# Patient Record
Sex: Male | Born: 1940 | Race: White | Hispanic: No | Smoking: Former smoker
Health system: Southern US, Community
[De-identification: ages and names within clinical notes are randomized; demographics above are authoritative.]

## PROBLEM LIST (undated history)

## (undated) DIAGNOSIS — F419 Anxiety disorder, unspecified: Secondary | ICD-10-CM

## (undated) DIAGNOSIS — I219 Acute myocardial infarction, unspecified: Secondary | ICD-10-CM

## (undated) DIAGNOSIS — E1065 Type 1 diabetes mellitus with hyperglycemia: Secondary | ICD-10-CM

## (undated) DIAGNOSIS — E119 Type 2 diabetes mellitus without complications: Secondary | ICD-10-CM

## (undated) DIAGNOSIS — I10 Essential (primary) hypertension: Secondary | ICD-10-CM

## (undated) DIAGNOSIS — F0390 Unspecified dementia without behavioral disturbance: Secondary | ICD-10-CM

## (undated) DIAGNOSIS — M545 Low back pain, unspecified: Secondary | ICD-10-CM

## (undated) DIAGNOSIS — E1049 Type 1 diabetes mellitus with other diabetic neurological complication: Secondary | ICD-10-CM

## (undated) DIAGNOSIS — R569 Unspecified convulsions: Secondary | ICD-10-CM

## (undated) DIAGNOSIS — R5383 Other fatigue: Secondary | ICD-10-CM

## (undated) DIAGNOSIS — J45909 Unspecified asthma, uncomplicated: Secondary | ICD-10-CM

## (undated) DIAGNOSIS — R5381 Other malaise: Secondary | ICD-10-CM

## (undated) DIAGNOSIS — E039 Hypothyroidism, unspecified: Secondary | ICD-10-CM

## (undated) DIAGNOSIS — J189 Pneumonia, unspecified organism: Secondary | ICD-10-CM

## (undated) DIAGNOSIS — I2510 Atherosclerotic heart disease of native coronary artery without angina pectoris: Secondary | ICD-10-CM

## (undated) DIAGNOSIS — G609 Hereditary and idiopathic neuropathy, unspecified: Secondary | ICD-10-CM

## (undated) DIAGNOSIS — F329 Major depressive disorder, single episode, unspecified: Secondary | ICD-10-CM

## (undated) DIAGNOSIS — E1051 Type 1 diabetes mellitus with diabetic peripheral angiopathy without gangrene: Secondary | ICD-10-CM

## (undated) DIAGNOSIS — N400 Enlarged prostate without lower urinary tract symptoms: Secondary | ICD-10-CM

## (undated) DIAGNOSIS — K529 Noninfective gastroenteritis and colitis, unspecified: Secondary | ICD-10-CM

## (undated) DIAGNOSIS — D509 Iron deficiency anemia, unspecified: Secondary | ICD-10-CM

## (undated) DIAGNOSIS — K649 Unspecified hemorrhoids: Secondary | ICD-10-CM

## (undated) DIAGNOSIS — E538 Deficiency of other specified B group vitamins: Secondary | ICD-10-CM

## (undated) DIAGNOSIS — E291 Testicular hypofunction: Secondary | ICD-10-CM

## (undated) DIAGNOSIS — A419 Sepsis, unspecified organism: Secondary | ICD-10-CM

## (undated) DIAGNOSIS — E785 Hyperlipidemia, unspecified: Secondary | ICD-10-CM

## (undated) DIAGNOSIS — E11329 Type 2 diabetes mellitus with mild nonproliferative diabetic retinopathy without macular edema: Secondary | ICD-10-CM

## (undated) DIAGNOSIS — M4802 Spinal stenosis, cervical region: Secondary | ICD-10-CM

## (undated) DIAGNOSIS — IMO0002 Reserved for concepts with insufficient information to code with codable children: Secondary | ICD-10-CM

## (undated) DIAGNOSIS — N189 Chronic kidney disease, unspecified: Secondary | ICD-10-CM

## (undated) DIAGNOSIS — F32A Depression, unspecified: Secondary | ICD-10-CM

## (undated) DIAGNOSIS — D638 Anemia in other chronic diseases classified elsewhere: Secondary | ICD-10-CM

## (undated) DIAGNOSIS — J181 Lobar pneumonia, unspecified organism: Secondary | ICD-10-CM

## (undated) DIAGNOSIS — G8929 Other chronic pain: Secondary | ICD-10-CM

## (undated) DIAGNOSIS — N2889 Other specified disorders of kidney and ureter: Secondary | ICD-10-CM

## (undated) DIAGNOSIS — I951 Orthostatic hypotension: Secondary | ICD-10-CM

## (undated) DIAGNOSIS — N529 Male erectile dysfunction, unspecified: Secondary | ICD-10-CM

## (undated) DIAGNOSIS — K589 Irritable bowel syndrome without diarrhea: Secondary | ICD-10-CM

## (undated) DIAGNOSIS — J449 Chronic obstructive pulmonary disease, unspecified: Secondary | ICD-10-CM

## (undated) DIAGNOSIS — G4710 Hypersomnia, unspecified: Secondary | ICD-10-CM

## (undated) DIAGNOSIS — N179 Acute kidney failure, unspecified: Secondary | ICD-10-CM

## (undated) DIAGNOSIS — G9340 Encephalopathy, unspecified: Secondary | ICD-10-CM

## (undated) DIAGNOSIS — J9601 Acute respiratory failure with hypoxia: Secondary | ICD-10-CM

## (undated) HISTORY — DX: Sepsis, unspecified organism: A41.9

## (undated) HISTORY — DX: Low back pain, unspecified: M54.50

## (undated) HISTORY — DX: Type 2 diabetes mellitus with mild nonproliferative diabetic retinopathy without macular edema: E11.329

## (undated) HISTORY — DX: Lobar pneumonia, unspecified organism: J18.1

## (undated) HISTORY — DX: Essential (primary) hypertension: I10

## (undated) HISTORY — DX: Low back pain: M54.5

## (undated) HISTORY — DX: Chronic obstructive pulmonary disease, unspecified: J44.9

## (undated) HISTORY — DX: Unspecified convulsions: R56.9

## (undated) HISTORY — DX: Chronic kidney disease, unspecified: N17.9

## (undated) HISTORY — DX: Other fatigue: R53.83

## (undated) HISTORY — DX: Type 1 diabetes mellitus with other diabetic neurological complication: E10.49

## (undated) HISTORY — DX: Anxiety disorder, unspecified: F41.9

## (undated) HISTORY — DX: Pneumonia, unspecified organism: J18.9

## (undated) HISTORY — DX: Benign prostatic hyperplasia without lower urinary tract symptoms: N40.0

## (undated) HISTORY — DX: Type 1 diabetes mellitus with diabetic peripheral angiopathy without gangrene: E10.51

## (undated) HISTORY — DX: Spinal stenosis, cervical region: M48.02

## (undated) HISTORY — DX: Noninfective gastroenteritis and colitis, unspecified: K52.9

## (undated) HISTORY — DX: Encephalopathy, unspecified: G93.40

## (undated) HISTORY — DX: Anemia in other chronic diseases classified elsewhere: D63.8

## (undated) HISTORY — DX: Other chronic pain: G89.29

## (undated) HISTORY — DX: Acute myocardial infarction, unspecified: I21.9

## (undated) HISTORY — DX: Other malaise: R53.81

## (undated) HISTORY — DX: Acute kidney failure, unspecified: N18.9

## (undated) HISTORY — DX: Major depressive disorder, single episode, unspecified: F32.9

## (undated) HISTORY — DX: Unspecified dementia, unspecified severity, without behavioral disturbance, psychotic disturbance, mood disturbance, and anxiety: F03.90

## (undated) HISTORY — DX: Hypersomnia, unspecified: G47.10

## (undated) HISTORY — DX: Orthostatic hypotension: I95.1

## (undated) HISTORY — DX: Unspecified hemorrhoids: K64.9

## (undated) HISTORY — DX: Atherosclerotic heart disease of native coronary artery without angina pectoris: I25.10

## (undated) HISTORY — DX: Reserved for concepts with insufficient information to code with codable children: IMO0002

## (undated) HISTORY — DX: Depression, unspecified: F32.A

## (undated) HISTORY — DX: Acute respiratory failure with hypoxia: J96.01

## (undated) HISTORY — DX: Testicular hypofunction: E29.1

## (undated) HISTORY — DX: Irritable bowel syndrome without diarrhea: K58.9

## (undated) HISTORY — DX: Type 1 diabetes mellitus with hyperglycemia: E10.65

## (undated) HISTORY — DX: Hypothyroidism, unspecified: E03.9

## (undated) HISTORY — PX: PENILE PROSTHESIS IMPLANT: SHX240

## (undated) HISTORY — DX: Hereditary and idiopathic neuropathy, unspecified: G60.9

## (undated) HISTORY — DX: Deficiency of other specified B group vitamins: E53.8

## (undated) HISTORY — PX: TONSILLECTOMY: SUR1361

## (undated) HISTORY — DX: Male erectile dysfunction, unspecified: N52.9

## (undated) HISTORY — DX: Iron deficiency anemia, unspecified: D50.9

## (undated) HISTORY — DX: Hyperlipidemia, unspecified: E78.5

## (undated) HISTORY — DX: Type 2 diabetes mellitus without complications: E11.9

## (undated) HISTORY — DX: Chronic kidney disease, unspecified: N18.9

---

## 1984-09-07 DIAGNOSIS — E119 Type 2 diabetes mellitus without complications: Secondary | ICD-10-CM

## 1984-09-07 HISTORY — DX: Type 2 diabetes mellitus without complications: E11.9

## 1999-07-12 ENCOUNTER — Emergency Department (HOSPITAL_COMMUNITY): Admission: EM | Admit: 1999-07-12 | Discharge: 1999-07-12 | Payer: Self-pay | Admitting: Emergency Medicine

## 1999-07-14 ENCOUNTER — Inpatient Hospital Stay (HOSPITAL_COMMUNITY): Admission: EM | Admit: 1999-07-14 | Discharge: 1999-07-17 | Payer: Self-pay | Admitting: Emergency Medicine

## 1999-07-14 ENCOUNTER — Encounter: Payer: Self-pay | Admitting: Internal Medicine

## 2002-01-05 DIAGNOSIS — M4802 Spinal stenosis, cervical region: Secondary | ICD-10-CM

## 2002-01-05 HISTORY — DX: Spinal stenosis, cervical region: M48.02

## 2002-01-18 ENCOUNTER — Ambulatory Visit (HOSPITAL_COMMUNITY): Admission: RE | Admit: 2002-01-18 | Discharge: 2002-01-18 | Payer: Self-pay | Admitting: Sports Medicine

## 2002-01-18 ENCOUNTER — Encounter: Payer: Self-pay | Admitting: Sports Medicine

## 2002-04-19 ENCOUNTER — Inpatient Hospital Stay (HOSPITAL_COMMUNITY): Admission: EM | Admit: 2002-04-19 | Discharge: 2002-04-21 | Payer: Self-pay | Admitting: Emergency Medicine

## 2002-04-19 ENCOUNTER — Encounter: Payer: Self-pay | Admitting: Internal Medicine

## 2002-04-20 ENCOUNTER — Encounter: Payer: Self-pay | Admitting: Internal Medicine

## 2002-08-08 ENCOUNTER — Encounter: Admission: RE | Admit: 2002-08-08 | Discharge: 2002-11-06 | Payer: Self-pay | Admitting: Internal Medicine

## 2004-03-06 ENCOUNTER — Inpatient Hospital Stay (HOSPITAL_COMMUNITY): Admission: EM | Admit: 2004-03-06 | Discharge: 2004-03-08 | Payer: Self-pay | Admitting: Emergency Medicine

## 2004-06-09 ENCOUNTER — Emergency Department (HOSPITAL_COMMUNITY): Admission: EM | Admit: 2004-06-09 | Discharge: 2004-06-09 | Payer: Self-pay | Admitting: Emergency Medicine

## 2004-06-12 ENCOUNTER — Inpatient Hospital Stay (HOSPITAL_COMMUNITY): Admission: EM | Admit: 2004-06-12 | Discharge: 2004-06-16 | Payer: Self-pay | Admitting: Emergency Medicine

## 2004-06-17 ENCOUNTER — Ambulatory Visit: Payer: Self-pay | Admitting: Internal Medicine

## 2004-06-20 ENCOUNTER — Ambulatory Visit: Payer: Self-pay | Admitting: Internal Medicine

## 2004-06-24 ENCOUNTER — Ambulatory Visit: Payer: Self-pay | Admitting: Internal Medicine

## 2004-08-04 ENCOUNTER — Ambulatory Visit: Payer: Self-pay | Admitting: Internal Medicine

## 2004-08-11 ENCOUNTER — Ambulatory Visit: Payer: Self-pay | Admitting: Internal Medicine

## 2004-08-15 ENCOUNTER — Ambulatory Visit: Payer: Self-pay | Admitting: Internal Medicine

## 2004-08-25 ENCOUNTER — Ambulatory Visit: Payer: Self-pay | Admitting: Internal Medicine

## 2004-10-01 ENCOUNTER — Ambulatory Visit: Payer: Self-pay | Admitting: Internal Medicine

## 2004-10-16 ENCOUNTER — Ambulatory Visit: Payer: Self-pay | Admitting: Internal Medicine

## 2004-10-23 ENCOUNTER — Ambulatory Visit: Payer: Self-pay | Admitting: Internal Medicine

## 2004-10-29 ENCOUNTER — Ambulatory Visit: Payer: Self-pay | Admitting: Internal Medicine

## 2004-10-30 ENCOUNTER — Ambulatory Visit: Payer: Self-pay | Admitting: Internal Medicine

## 2004-11-27 ENCOUNTER — Ambulatory Visit: Payer: Self-pay | Admitting: Internal Medicine

## 2004-11-28 ENCOUNTER — Ambulatory Visit: Payer: Self-pay | Admitting: Internal Medicine

## 2004-12-31 ENCOUNTER — Ambulatory Visit: Payer: Self-pay | Admitting: Internal Medicine

## 2005-01-01 LAB — HM COLONOSCOPY

## 2005-01-27 ENCOUNTER — Ambulatory Visit: Payer: Self-pay | Admitting: Internal Medicine

## 2005-02-04 ENCOUNTER — Ambulatory Visit: Payer: Self-pay | Admitting: Internal Medicine

## 2005-02-18 ENCOUNTER — Ambulatory Visit: Payer: Self-pay | Admitting: Internal Medicine

## 2005-05-22 ENCOUNTER — Ambulatory Visit: Payer: Self-pay | Admitting: Internal Medicine

## 2005-05-22 ENCOUNTER — Inpatient Hospital Stay (HOSPITAL_COMMUNITY): Admission: AD | Admit: 2005-05-22 | Discharge: 2005-05-28 | Payer: Self-pay | Admitting: Internal Medicine

## 2005-05-23 ENCOUNTER — Ambulatory Visit: Payer: Self-pay | Admitting: Psychiatry

## 2005-05-25 ENCOUNTER — Encounter (INDEPENDENT_AMBULATORY_CARE_PROVIDER_SITE_OTHER): Payer: Self-pay | Admitting: Interventional Cardiology

## 2005-05-28 ENCOUNTER — Encounter: Payer: Self-pay | Admitting: Internal Medicine

## 2005-06-03 ENCOUNTER — Emergency Department (HOSPITAL_COMMUNITY): Admission: EM | Admit: 2005-06-03 | Discharge: 2005-06-03 | Payer: Self-pay | Admitting: Emergency Medicine

## 2005-06-10 ENCOUNTER — Ambulatory Visit: Payer: Self-pay | Admitting: Internal Medicine

## 2005-06-25 ENCOUNTER — Ambulatory Visit (HOSPITAL_COMMUNITY): Admission: RE | Admit: 2005-06-25 | Discharge: 2005-06-25 | Payer: Self-pay | Admitting: Gastroenterology

## 2005-06-25 ENCOUNTER — Encounter (INDEPENDENT_AMBULATORY_CARE_PROVIDER_SITE_OTHER): Payer: Self-pay | Admitting: Specialist

## 2005-06-25 HISTORY — PX: COLONOSCOPY: SHX174

## 2005-06-26 ENCOUNTER — Ambulatory Visit: Payer: Self-pay | Admitting: Internal Medicine

## 2005-07-27 ENCOUNTER — Ambulatory Visit (HOSPITAL_COMMUNITY): Payer: Self-pay | Admitting: Psychiatry

## 2005-11-04 ENCOUNTER — Ambulatory Visit: Payer: Self-pay | Admitting: Internal Medicine

## 2005-11-12 ENCOUNTER — Ambulatory Visit: Payer: Self-pay | Admitting: Internal Medicine

## 2005-12-03 ENCOUNTER — Ambulatory Visit: Payer: Self-pay | Admitting: Internal Medicine

## 2005-12-16 ENCOUNTER — Ambulatory Visit: Payer: Self-pay | Admitting: Internal Medicine

## 2006-01-04 ENCOUNTER — Ambulatory Visit: Payer: Self-pay | Admitting: Hospitalist

## 2006-01-14 ENCOUNTER — Ambulatory Visit: Payer: Self-pay | Admitting: Internal Medicine

## 2006-01-21 ENCOUNTER — Ambulatory Visit: Payer: Self-pay | Admitting: Internal Medicine

## 2006-02-04 ENCOUNTER — Ambulatory Visit: Payer: Self-pay | Admitting: Internal Medicine

## 2006-02-11 ENCOUNTER — Ambulatory Visit (HOSPITAL_COMMUNITY): Admission: RE | Admit: 2006-02-11 | Discharge: 2006-02-11 | Payer: Self-pay | Admitting: Internal Medicine

## 2006-02-18 ENCOUNTER — Encounter (HOSPITAL_COMMUNITY): Admission: RE | Admit: 2006-02-18 | Discharge: 2006-05-19 | Payer: Self-pay | Admitting: Internal Medicine

## 2006-03-17 ENCOUNTER — Ambulatory Visit: Payer: Self-pay | Admitting: Internal Medicine

## 2006-05-06 ENCOUNTER — Encounter: Admission: RE | Admit: 2006-05-06 | Discharge: 2006-05-06 | Payer: Self-pay | Admitting: Internal Medicine

## 2006-05-06 ENCOUNTER — Ambulatory Visit: Payer: Self-pay | Admitting: Internal Medicine

## 2006-05-06 ENCOUNTER — Ambulatory Visit (HOSPITAL_COMMUNITY): Admission: RE | Admit: 2006-05-06 | Discharge: 2006-05-06 | Payer: Self-pay | Admitting: Internal Medicine

## 2006-07-01 ENCOUNTER — Ambulatory Visit: Payer: Self-pay | Admitting: Internal Medicine

## 2006-07-20 ENCOUNTER — Ambulatory Visit: Payer: Self-pay | Admitting: Hospitalist

## 2006-09-24 ENCOUNTER — Telehealth (INDEPENDENT_AMBULATORY_CARE_PROVIDER_SITE_OTHER): Payer: Self-pay | Admitting: *Deleted

## 2006-09-27 DIAGNOSIS — D509 Iron deficiency anemia, unspecified: Secondary | ICD-10-CM

## 2006-09-27 DIAGNOSIS — E1049 Type 1 diabetes mellitus with other diabetic neurological complication: Secondary | ICD-10-CM

## 2006-09-27 DIAGNOSIS — E039 Hypothyroidism, unspecified: Secondary | ICD-10-CM

## 2006-09-27 DIAGNOSIS — G4710 Hypersomnia, unspecified: Secondary | ICD-10-CM

## 2006-09-27 DIAGNOSIS — I10 Essential (primary) hypertension: Secondary | ICD-10-CM

## 2006-09-27 DIAGNOSIS — F528 Other sexual dysfunction not due to a substance or known physiological condition: Secondary | ICD-10-CM

## 2006-09-27 DIAGNOSIS — N529 Male erectile dysfunction, unspecified: Secondary | ICD-10-CM

## 2006-09-27 DIAGNOSIS — M4802 Spinal stenosis, cervical region: Secondary | ICD-10-CM | POA: Insufficient documentation

## 2006-09-27 DIAGNOSIS — E1065 Type 1 diabetes mellitus with hyperglycemia: Secondary | ICD-10-CM

## 2006-09-27 HISTORY — DX: Hypothyroidism, unspecified: E03.9

## 2006-09-27 HISTORY — DX: Hypersomnia, unspecified: G47.10

## 2006-09-27 HISTORY — DX: Male erectile dysfunction, unspecified: N52.9

## 2006-09-27 HISTORY — DX: Iron deficiency anemia, unspecified: D50.9

## 2006-10-20 ENCOUNTER — Ambulatory Visit: Payer: Self-pay | Admitting: Hospitalist

## 2006-10-20 LAB — CONVERTED CEMR LAB: Blood Glucose, Fingerstick: 144

## 2006-10-27 ENCOUNTER — Ambulatory Visit: Payer: Self-pay | Admitting: Internal Medicine

## 2006-11-08 ENCOUNTER — Ambulatory Visit: Payer: Self-pay | Admitting: *Deleted

## 2006-11-08 ENCOUNTER — Ambulatory Visit: Payer: Self-pay | Admitting: Internal Medicine

## 2006-11-08 ENCOUNTER — Encounter (INDEPENDENT_AMBULATORY_CARE_PROVIDER_SITE_OTHER): Payer: Self-pay | Admitting: *Deleted

## 2006-11-09 ENCOUNTER — Telehealth (INDEPENDENT_AMBULATORY_CARE_PROVIDER_SITE_OTHER): Payer: Self-pay | Admitting: *Deleted

## 2006-11-17 ENCOUNTER — Encounter (INDEPENDENT_AMBULATORY_CARE_PROVIDER_SITE_OTHER): Payer: Self-pay | Admitting: *Deleted

## 2006-11-17 ENCOUNTER — Ambulatory Visit: Payer: Self-pay | Admitting: *Deleted

## 2006-11-22 ENCOUNTER — Telehealth (INDEPENDENT_AMBULATORY_CARE_PROVIDER_SITE_OTHER): Payer: Self-pay | Admitting: Pharmacy Technician

## 2006-11-30 ENCOUNTER — Ambulatory Visit: Payer: Self-pay | Admitting: Internal Medicine

## 2006-11-30 LAB — CONVERTED CEMR LAB: Blood Glucose, Fingerstick: 90

## 2006-12-29 ENCOUNTER — Telehealth: Payer: Self-pay | Admitting: *Deleted

## 2006-12-31 ENCOUNTER — Encounter (INDEPENDENT_AMBULATORY_CARE_PROVIDER_SITE_OTHER): Payer: Self-pay | Admitting: Internal Medicine

## 2006-12-31 ENCOUNTER — Ambulatory Visit: Payer: Self-pay | Admitting: Hospitalist

## 2006-12-31 ENCOUNTER — Telehealth: Payer: Self-pay | Admitting: *Deleted

## 2007-01-03 LAB — CONVERTED CEMR LAB
BUN: 31 mg/dL — ABNORMAL HIGH (ref 6–23)
CO2: 24 meq/L (ref 19–32)
Calcium: 9.4 mg/dL (ref 8.4–10.5)
Creatinine, Ser: 1.49 mg/dL (ref 0.40–1.50)
Glucose, Bld: 183 mg/dL — ABNORMAL HIGH (ref 70–99)
TSH: 5.423 microintl units/mL (ref 0.350–5.50)

## 2007-01-06 ENCOUNTER — Ambulatory Visit: Payer: Self-pay | Admitting: Internal Medicine

## 2007-01-07 ENCOUNTER — Encounter (INDEPENDENT_AMBULATORY_CARE_PROVIDER_SITE_OTHER): Payer: Self-pay | Admitting: Internal Medicine

## 2007-01-07 ENCOUNTER — Ambulatory Visit: Payer: Self-pay | Admitting: Internal Medicine

## 2007-01-10 LAB — CONVERTED CEMR LAB
BUN: 30 mg/dL — ABNORMAL HIGH (ref 6–23)
MCHC: 32.9 g/dL (ref 30.0–36.0)
Platelets: 257 10*3/uL (ref 150–400)
Potassium: 5.2 meq/L (ref 3.5–5.3)
RDW: 13.3 % (ref 11.5–14.0)
Sodium: 140 meq/L (ref 135–145)

## 2007-01-12 ENCOUNTER — Telehealth (INDEPENDENT_AMBULATORY_CARE_PROVIDER_SITE_OTHER): Payer: Self-pay | Admitting: Internal Medicine

## 2007-01-19 ENCOUNTER — Encounter: Payer: Self-pay | Admitting: *Deleted

## 2007-01-20 ENCOUNTER — Ambulatory Visit (HOSPITAL_COMMUNITY): Admission: RE | Admit: 2007-01-20 | Discharge: 2007-01-20 | Payer: Self-pay | Admitting: Internal Medicine

## 2007-01-20 ENCOUNTER — Ambulatory Visit: Payer: Self-pay | Admitting: Internal Medicine

## 2007-01-20 ENCOUNTER — Encounter: Payer: Self-pay | Admitting: Internal Medicine

## 2007-01-20 DIAGNOSIS — I959 Hypotension, unspecified: Secondary | ICD-10-CM

## 2007-01-20 DIAGNOSIS — E875 Hyperkalemia: Secondary | ICD-10-CM | POA: Insufficient documentation

## 2007-01-20 LAB — CONVERTED CEMR LAB
BUN: 30 mg/dL — ABNORMAL HIGH (ref 6–23)
Chloride: 109 meq/L (ref 96–112)
Creatinine, Ser: 1.24 mg/dL (ref 0.40–1.50)
Eosinophils Absolute: 0.2 10*3/uL (ref 0.0–0.7)
Glucose, Bld: 57 mg/dL — ABNORMAL LOW (ref 70–99)
HCT: 33 % — ABNORMAL LOW (ref 39.0–52.0)
Hemoglobin: 11.2 g/dL — ABNORMAL LOW (ref 13.0–17.0)
Lymphs Abs: 1.4 10*3/uL (ref 0.7–3.3)
MCV: 92.9 fL (ref 78.0–100.0)
Monocytes Absolute: 0.6 10*3/uL (ref 0.2–0.7)
Monocytes Relative: 11 % (ref 3–11)
Neutrophils Relative %: 62 % (ref 43–77)
Potassium: 5.9 meq/L — ABNORMAL HIGH (ref 3.5–5.3)
RBC: 3.55 M/uL — ABNORMAL LOW (ref 4.22–5.81)
WBC: 5.8 10*3/uL (ref 4.0–10.5)

## 2007-01-26 ENCOUNTER — Ambulatory Visit (HOSPITAL_COMMUNITY): Admission: RE | Admit: 2007-01-26 | Discharge: 2007-01-26 | Payer: Self-pay | Admitting: Internal Medicine

## 2007-02-03 ENCOUNTER — Encounter (INDEPENDENT_AMBULATORY_CARE_PROVIDER_SITE_OTHER): Payer: Self-pay | Admitting: *Deleted

## 2007-02-04 ENCOUNTER — Telehealth: Payer: Self-pay | Admitting: *Deleted

## 2007-02-09 ENCOUNTER — Ambulatory Visit: Payer: Self-pay | Admitting: Internal Medicine

## 2007-02-09 ENCOUNTER — Encounter (INDEPENDENT_AMBULATORY_CARE_PROVIDER_SITE_OTHER): Payer: Self-pay | Admitting: *Deleted

## 2007-02-09 DIAGNOSIS — E1142 Type 2 diabetes mellitus with diabetic polyneuropathy: Secondary | ICD-10-CM | POA: Insufficient documentation

## 2007-02-09 DIAGNOSIS — I10 Essential (primary) hypertension: Secondary | ICD-10-CM

## 2007-02-09 DIAGNOSIS — I951 Orthostatic hypotension: Secondary | ICD-10-CM | POA: Insufficient documentation

## 2007-02-09 HISTORY — DX: Essential (primary) hypertension: I10

## 2007-02-09 HISTORY — DX: Orthostatic hypotension: I95.1

## 2007-02-09 LAB — CONVERTED CEMR LAB
BUN: 29 mg/dL — ABNORMAL HIGH (ref 6–23)
Calcium: 9.6 mg/dL (ref 8.4–10.5)
Glucose, Bld: 104 mg/dL — ABNORMAL HIGH (ref 70–99)
Sodium: 140 meq/L (ref 135–145)

## 2007-02-12 ENCOUNTER — Encounter (INDEPENDENT_AMBULATORY_CARE_PROVIDER_SITE_OTHER): Payer: Self-pay | Admitting: Internal Medicine

## 2007-02-14 ENCOUNTER — Telehealth (INDEPENDENT_AMBULATORY_CARE_PROVIDER_SITE_OTHER): Payer: Self-pay | Admitting: *Deleted

## 2007-02-28 ENCOUNTER — Encounter (INDEPENDENT_AMBULATORY_CARE_PROVIDER_SITE_OTHER): Payer: Self-pay | Admitting: *Deleted

## 2007-02-28 ENCOUNTER — Ambulatory Visit: Payer: Self-pay | Admitting: Internal Medicine

## 2007-02-28 DIAGNOSIS — R5383 Other fatigue: Secondary | ICD-10-CM

## 2007-02-28 DIAGNOSIS — R5381 Other malaise: Secondary | ICD-10-CM

## 2007-02-28 LAB — CONVERTED CEMR LAB
Basophils Absolute: 0 10*3/uL (ref 0.0–0.1)
Blood Glucose, Fingerstick: 94
Free T4: 1.09 ng/dL (ref 0.89–1.80)
Lymphocytes Relative: 18 % (ref 12–46)
Neutro Abs: 5.5 10*3/uL (ref 1.7–7.7)
Platelets: 264 10*3/uL (ref 150–400)
RDW: 13.6 % (ref 11.5–14.0)
TSH: 4.143 microintl units/mL (ref 0.350–5.50)

## 2007-03-10 ENCOUNTER — Telehealth (INDEPENDENT_AMBULATORY_CARE_PROVIDER_SITE_OTHER): Payer: Self-pay | Admitting: *Deleted

## 2007-03-15 ENCOUNTER — Ambulatory Visit: Payer: Self-pay | Admitting: Internal Medicine

## 2007-03-15 ENCOUNTER — Encounter (INDEPENDENT_AMBULATORY_CARE_PROVIDER_SITE_OTHER): Payer: Self-pay | Admitting: *Deleted

## 2007-04-11 ENCOUNTER — Telehealth (INDEPENDENT_AMBULATORY_CARE_PROVIDER_SITE_OTHER): Payer: Self-pay | Admitting: *Deleted

## 2007-05-09 ENCOUNTER — Encounter (INDEPENDENT_AMBULATORY_CARE_PROVIDER_SITE_OTHER): Payer: Self-pay | Admitting: *Deleted

## 2007-05-11 ENCOUNTER — Telehealth (INDEPENDENT_AMBULATORY_CARE_PROVIDER_SITE_OTHER): Payer: Self-pay | Admitting: *Deleted

## 2007-05-12 ENCOUNTER — Telehealth (INDEPENDENT_AMBULATORY_CARE_PROVIDER_SITE_OTHER): Payer: Self-pay | Admitting: *Deleted

## 2007-06-09 ENCOUNTER — Telehealth (INDEPENDENT_AMBULATORY_CARE_PROVIDER_SITE_OTHER): Payer: Self-pay | Admitting: *Deleted

## 2007-06-09 ENCOUNTER — Ambulatory Visit: Payer: Self-pay | Admitting: Hospitalist

## 2007-06-10 ENCOUNTER — Telehealth: Payer: Self-pay | Admitting: *Deleted

## 2007-06-10 LAB — CONVERTED CEMR LAB
Potassium: 5.5 meq/L — ABNORMAL HIGH (ref 3.5–5.3)
Sodium: 141 meq/L (ref 135–145)

## 2007-06-14 ENCOUNTER — Ambulatory Visit: Payer: Self-pay | Admitting: *Deleted

## 2007-06-14 LAB — CONVERTED CEMR LAB
CO2: 28 meq/L (ref 19–32)
Glucose, Bld: 136 mg/dL — ABNORMAL HIGH (ref 70–99)
Potassium: 5.4 meq/L — ABNORMAL HIGH (ref 3.5–5.3)
Sodium: 142 meq/L (ref 135–145)

## 2007-06-24 ENCOUNTER — Telehealth: Payer: Self-pay | Admitting: *Deleted

## 2007-06-28 ENCOUNTER — Telehealth (INDEPENDENT_AMBULATORY_CARE_PROVIDER_SITE_OTHER): Payer: Self-pay | Admitting: *Deleted

## 2007-07-12 ENCOUNTER — Telehealth (INDEPENDENT_AMBULATORY_CARE_PROVIDER_SITE_OTHER): Payer: Self-pay | Admitting: *Deleted

## 2007-07-27 ENCOUNTER — Ambulatory Visit: Payer: Self-pay | Admitting: Hospitalist

## 2007-07-27 ENCOUNTER — Encounter (INDEPENDENT_AMBULATORY_CARE_PROVIDER_SITE_OTHER): Payer: Self-pay | Admitting: *Deleted

## 2007-07-27 LAB — CONVERTED CEMR LAB
BUN: 25 mg/dL — ABNORMAL HIGH (ref 6–23)
CO2: 25 meq/L (ref 19–32)
Calcium: 9.2 mg/dL (ref 8.4–10.5)
Creatinine, Ser: 1.1 mg/dL (ref 0.40–1.50)
Glucose, Bld: 137 mg/dL — ABNORMAL HIGH (ref 70–99)
Hgb A1c MFr Bld: 7.5 %

## 2007-07-29 ENCOUNTER — Telehealth (INDEPENDENT_AMBULATORY_CARE_PROVIDER_SITE_OTHER): Payer: Self-pay | Admitting: *Deleted

## 2007-08-08 ENCOUNTER — Ambulatory Visit: Payer: Self-pay | Admitting: Hospitalist

## 2007-08-10 ENCOUNTER — Telehealth (INDEPENDENT_AMBULATORY_CARE_PROVIDER_SITE_OTHER): Payer: Self-pay | Admitting: *Deleted

## 2007-09-06 ENCOUNTER — Telehealth: Payer: Self-pay | Admitting: *Deleted

## 2007-09-20 ENCOUNTER — Telehealth (INDEPENDENT_AMBULATORY_CARE_PROVIDER_SITE_OTHER): Payer: Self-pay | Admitting: *Deleted

## 2007-09-22 ENCOUNTER — Telehealth: Payer: Self-pay | Admitting: Infectious Disease

## 2007-09-29 ENCOUNTER — Encounter (INDEPENDENT_AMBULATORY_CARE_PROVIDER_SITE_OTHER): Payer: Self-pay | Admitting: Internal Medicine

## 2007-09-29 ENCOUNTER — Ambulatory Visit: Payer: Self-pay | Admitting: Internal Medicine

## 2007-09-29 DIAGNOSIS — M79609 Pain in unspecified limb: Secondary | ICD-10-CM

## 2007-09-30 LAB — CONVERTED CEMR LAB
LDL Cholesterol: 101 mg/dL — ABNORMAL HIGH (ref 0–99)
TSH: 2.669 microintl units/mL (ref 0.350–5.50)
Total CHOL/HDL Ratio: 2.9
VLDL: 20 mg/dL (ref 0–40)

## 2007-10-12 ENCOUNTER — Encounter: Admission: RE | Admit: 2007-10-12 | Discharge: 2007-12-07 | Payer: Self-pay | Admitting: Internal Medicine

## 2007-10-12 ENCOUNTER — Encounter (INDEPENDENT_AMBULATORY_CARE_PROVIDER_SITE_OTHER): Payer: Self-pay | Admitting: *Deleted

## 2007-10-19 ENCOUNTER — Encounter (INDEPENDENT_AMBULATORY_CARE_PROVIDER_SITE_OTHER): Payer: Self-pay | Admitting: *Deleted

## 2007-10-27 ENCOUNTER — Encounter (INDEPENDENT_AMBULATORY_CARE_PROVIDER_SITE_OTHER): Payer: Self-pay | Admitting: *Deleted

## 2007-11-01 ENCOUNTER — Telehealth (INDEPENDENT_AMBULATORY_CARE_PROVIDER_SITE_OTHER): Payer: Self-pay | Admitting: *Deleted

## 2007-11-01 ENCOUNTER — Ambulatory Visit: Payer: Self-pay | Admitting: Internal Medicine

## 2007-11-01 LAB — CONVERTED CEMR LAB
Blood Glucose, Fingerstick: 461
Hgb A1c MFr Bld: 8.6 %

## 2007-11-09 ENCOUNTER — Telehealth (INDEPENDENT_AMBULATORY_CARE_PROVIDER_SITE_OTHER): Payer: Self-pay | Admitting: *Deleted

## 2007-11-17 ENCOUNTER — Telehealth: Payer: Self-pay | Admitting: Licensed Clinical Social Worker

## 2007-11-18 DIAGNOSIS — E11329 Type 2 diabetes mellitus with mild nonproliferative diabetic retinopathy without macular edema: Secondary | ICD-10-CM

## 2007-11-18 DIAGNOSIS — E113299 Type 2 diabetes mellitus with mild nonproliferative diabetic retinopathy without macular edema, unspecified eye: Secondary | ICD-10-CM

## 2007-11-18 HISTORY — DX: Type 2 diabetes mellitus with mild nonproliferative diabetic retinopathy without macular edema: E11.329

## 2007-11-28 ENCOUNTER — Telehealth (INDEPENDENT_AMBULATORY_CARE_PROVIDER_SITE_OTHER): Payer: Self-pay | Admitting: *Deleted

## 2007-12-01 ENCOUNTER — Telehealth: Payer: Self-pay | Admitting: Licensed Clinical Social Worker

## 2007-12-07 ENCOUNTER — Encounter (INDEPENDENT_AMBULATORY_CARE_PROVIDER_SITE_OTHER): Payer: Self-pay | Admitting: *Deleted

## 2007-12-12 ENCOUNTER — Ambulatory Visit: Payer: Self-pay | Admitting: Internal Medicine

## 2007-12-12 ENCOUNTER — Encounter (INDEPENDENT_AMBULATORY_CARE_PROVIDER_SITE_OTHER): Payer: Self-pay | Admitting: *Deleted

## 2007-12-13 LAB — CONVERTED CEMR LAB
Calcium: 9.4 mg/dL (ref 8.4–10.5)
MCHC: 32.9 g/dL (ref 30.0–36.0)
MCV: 95.9 fL (ref 78.0–100.0)
Potassium: 5.2 meq/L (ref 3.5–5.3)
RDW: 13.9 % (ref 11.5–15.5)
Sodium: 143 meq/L (ref 135–145)

## 2007-12-14 ENCOUNTER — Telehealth (INDEPENDENT_AMBULATORY_CARE_PROVIDER_SITE_OTHER): Payer: Self-pay | Admitting: *Deleted

## 2007-12-28 ENCOUNTER — Telehealth (INDEPENDENT_AMBULATORY_CARE_PROVIDER_SITE_OTHER): Payer: Self-pay | Admitting: *Deleted

## 2008-01-01 DIAGNOSIS — K529 Noninfective gastroenteritis and colitis, unspecified: Secondary | ICD-10-CM | POA: Insufficient documentation

## 2008-01-01 DIAGNOSIS — R197 Diarrhea, unspecified: Secondary | ICD-10-CM

## 2008-01-01 HISTORY — DX: Noninfective gastroenteritis and colitis, unspecified: K52.9

## 2008-01-05 ENCOUNTER — Ambulatory Visit: Payer: Self-pay | Admitting: Infectious Disease

## 2008-01-05 ENCOUNTER — Encounter (INDEPENDENT_AMBULATORY_CARE_PROVIDER_SITE_OTHER): Payer: Self-pay | Admitting: *Deleted

## 2008-01-05 ENCOUNTER — Ambulatory Visit: Payer: Self-pay | Admitting: Internal Medicine

## 2008-01-05 LAB — CONVERTED CEMR LAB
Blood Glucose, Fingerstick: 195
C-Peptide: <0.05 ng/mL — ABNORMAL LOW (ref 0.80–3.90)

## 2008-01-12 ENCOUNTER — Telehealth (INDEPENDENT_AMBULATORY_CARE_PROVIDER_SITE_OTHER): Payer: Self-pay | Admitting: *Deleted

## 2008-01-16 ENCOUNTER — Encounter (INDEPENDENT_AMBULATORY_CARE_PROVIDER_SITE_OTHER): Payer: Self-pay | Admitting: *Deleted

## 2008-01-23 ENCOUNTER — Encounter (INDEPENDENT_AMBULATORY_CARE_PROVIDER_SITE_OTHER): Payer: Self-pay | Admitting: *Deleted

## 2008-01-25 ENCOUNTER — Telehealth: Payer: Self-pay | Admitting: *Deleted

## 2008-01-25 ENCOUNTER — Telehealth (INDEPENDENT_AMBULATORY_CARE_PROVIDER_SITE_OTHER): Payer: Self-pay | Admitting: *Deleted

## 2008-01-31 ENCOUNTER — Telehealth (INDEPENDENT_AMBULATORY_CARE_PROVIDER_SITE_OTHER): Payer: Self-pay | Admitting: *Deleted

## 2008-02-02 ENCOUNTER — Encounter (INDEPENDENT_AMBULATORY_CARE_PROVIDER_SITE_OTHER): Payer: Self-pay | Admitting: *Deleted

## 2008-02-02 ENCOUNTER — Ambulatory Visit: Payer: Self-pay | Admitting: Internal Medicine

## 2008-02-02 ENCOUNTER — Ambulatory Visit: Payer: Self-pay | Admitting: *Deleted

## 2008-02-02 LAB — CONVERTED CEMR LAB
Basophils Relative: 0 % (ref 0–1)
Blood Glucose, Fingerstick: 260
Eosinophils Absolute: 0.1 10*3/uL (ref 0.0–0.7)
Insulin/Carbohydrate Ratio: 1
MCHC: 34.2 g/dL (ref 30.0–36.0)
MCV: 93.7 fL (ref 78.0–100.0)
Monocytes Absolute: 0.5 10*3/uL (ref 0.1–1.0)
Monocytes Relative: 8 % (ref 3–12)
Neutrophils Relative %: 75 % (ref 43–77)
RBC: 3.72 M/uL — ABNORMAL LOW (ref 4.22–5.81)

## 2008-02-05 LAB — CONVERTED CEMR LAB
ALT: 18 units/L (ref 0–53)
AST: 19 units/L (ref 0–37)
Calcium: 9.6 mg/dL (ref 8.4–10.5)
Chloride: 106 meq/L (ref 96–112)
Creatinine, Ser: 1.06 mg/dL (ref 0.40–1.50)
Potassium: 4.4 meq/L (ref 3.5–5.3)
Sodium: 143 meq/L (ref 135–145)

## 2008-02-06 ENCOUNTER — Encounter (INDEPENDENT_AMBULATORY_CARE_PROVIDER_SITE_OTHER): Payer: Self-pay | Admitting: *Deleted

## 2008-02-06 ENCOUNTER — Telehealth (INDEPENDENT_AMBULATORY_CARE_PROVIDER_SITE_OTHER): Payer: Self-pay | Admitting: *Deleted

## 2008-02-06 ENCOUNTER — Ambulatory Visit: Payer: Self-pay | Admitting: *Deleted

## 2008-02-15 ENCOUNTER — Telehealth (INDEPENDENT_AMBULATORY_CARE_PROVIDER_SITE_OTHER): Payer: Self-pay | Admitting: *Deleted

## 2008-02-16 DIAGNOSIS — E538 Deficiency of other specified B group vitamins: Secondary | ICD-10-CM

## 2008-02-16 HISTORY — DX: Deficiency of other specified B group vitamins: E53.8

## 2008-02-21 ENCOUNTER — Encounter (INDEPENDENT_AMBULATORY_CARE_PROVIDER_SITE_OTHER): Payer: Self-pay | Admitting: *Deleted

## 2008-02-23 ENCOUNTER — Telehealth (INDEPENDENT_AMBULATORY_CARE_PROVIDER_SITE_OTHER): Payer: Self-pay | Admitting: *Deleted

## 2008-02-24 ENCOUNTER — Ambulatory Visit: Payer: Self-pay | Admitting: Internal Medicine

## 2008-02-27 DIAGNOSIS — J449 Chronic obstructive pulmonary disease, unspecified: Secondary | ICD-10-CM | POA: Insufficient documentation

## 2008-02-27 HISTORY — DX: Chronic obstructive pulmonary disease, unspecified: J44.9

## 2008-03-03 ENCOUNTER — Telehealth (INDEPENDENT_AMBULATORY_CARE_PROVIDER_SITE_OTHER): Payer: Self-pay | Admitting: Internal Medicine

## 2008-03-12 ENCOUNTER — Telehealth (INDEPENDENT_AMBULATORY_CARE_PROVIDER_SITE_OTHER): Payer: Self-pay | Admitting: *Deleted

## 2008-03-12 ENCOUNTER — Encounter: Payer: Self-pay | Admitting: *Deleted

## 2008-03-13 ENCOUNTER — Encounter (INDEPENDENT_AMBULATORY_CARE_PROVIDER_SITE_OTHER): Payer: Self-pay | Admitting: *Deleted

## 2008-03-13 ENCOUNTER — Ambulatory Visit: Payer: Self-pay | Admitting: Internal Medicine

## 2008-03-13 DIAGNOSIS — R351 Nocturia: Secondary | ICD-10-CM | POA: Insufficient documentation

## 2008-03-13 LAB — CONVERTED CEMR LAB
BUN: 21 mg/dL (ref 6–23)
Calcium: 9 mg/dL (ref 8.4–10.5)
Creatinine, Ser: 1.08 mg/dL (ref 0.40–1.50)
Glucose, Bld: 207 mg/dL — ABNORMAL HIGH (ref 70–99)
PSA: 0.37 ng/mL (ref 0.10–4.00)
Sodium: 141 meq/L (ref 135–145)

## 2008-03-14 ENCOUNTER — Encounter (INDEPENDENT_AMBULATORY_CARE_PROVIDER_SITE_OTHER): Payer: Self-pay | Admitting: *Deleted

## 2008-03-27 ENCOUNTER — Telehealth (INDEPENDENT_AMBULATORY_CARE_PROVIDER_SITE_OTHER): Payer: Self-pay | Admitting: *Deleted

## 2008-03-28 ENCOUNTER — Ambulatory Visit: Payer: Self-pay | Admitting: Internal Medicine

## 2008-03-30 ENCOUNTER — Ambulatory Visit: Payer: Self-pay | Admitting: Internal Medicine

## 2008-04-03 ENCOUNTER — Ambulatory Visit: Payer: Self-pay | Admitting: Internal Medicine

## 2008-04-03 LAB — CONVERTED CEMR LAB
ALT: 18 units/L (ref 0–53)
Alkaline Phosphatase: 71 units/L (ref 39–117)
Bilirubin, Direct: 0.1 mg/dL (ref 0.0–0.3)
CO2: 26 meq/L (ref 19–32)
Glucose, Bld: 217 mg/dL — ABNORMAL HIGH (ref 70–99)
Lymphocytes Relative: 15.4 % (ref 12.0–46.0)
Monocytes Relative: 8.1 % (ref 3.0–12.0)
Neutrophils Relative %: 73.7 % (ref 43.0–77.0)
Platelets: 249 10*3/uL (ref 150–400)
Potassium: 5.6 meq/L — ABNORMAL HIGH (ref 3.5–5.1)
RDW: 12.8 % (ref 11.5–14.6)
Sodium: 139 meq/L (ref 135–145)
Tissue Transglutaminase Ab, IgA: 0.4 units (ref <7)
Total Bilirubin: 0.7 mg/dL (ref 0.3–1.2)
Total Protein: 6.7 g/dL (ref 6.0–8.3)

## 2008-04-04 ENCOUNTER — Encounter: Payer: Self-pay | Admitting: Internal Medicine

## 2008-04-07 ENCOUNTER — Encounter: Payer: Self-pay | Admitting: Internal Medicine

## 2008-04-11 ENCOUNTER — Telehealth (INDEPENDENT_AMBULATORY_CARE_PROVIDER_SITE_OTHER): Payer: Self-pay | Admitting: *Deleted

## 2008-04-16 ENCOUNTER — Telehealth: Payer: Self-pay | Admitting: Internal Medicine

## 2008-04-23 ENCOUNTER — Encounter (INDEPENDENT_AMBULATORY_CARE_PROVIDER_SITE_OTHER): Payer: Self-pay | Admitting: *Deleted

## 2008-04-27 ENCOUNTER — Ambulatory Visit: Payer: Self-pay | Admitting: Internal Medicine

## 2008-04-27 DIAGNOSIS — K589 Irritable bowel syndrome without diarrhea: Secondary | ICD-10-CM

## 2008-04-27 HISTORY — DX: Irritable bowel syndrome, unspecified: K58.9

## 2008-04-30 ENCOUNTER — Ambulatory Visit: Payer: Self-pay | Admitting: Internal Medicine

## 2008-04-30 LAB — CONVERTED CEMR LAB: Insulin/Carbohydrate Ratio: 1

## 2008-05-01 ENCOUNTER — Encounter (INDEPENDENT_AMBULATORY_CARE_PROVIDER_SITE_OTHER): Payer: Self-pay | Admitting: *Deleted

## 2008-05-01 ENCOUNTER — Ambulatory Visit: Payer: Self-pay | Admitting: *Deleted

## 2008-05-03 ENCOUNTER — Ambulatory Visit: Payer: Self-pay | Admitting: Internal Medicine

## 2008-05-04 ENCOUNTER — Ambulatory Visit: Payer: Self-pay | Admitting: Internal Medicine

## 2008-05-04 LAB — CONVERTED CEMR LAB

## 2008-05-07 ENCOUNTER — Telehealth (INDEPENDENT_AMBULATORY_CARE_PROVIDER_SITE_OTHER): Payer: Self-pay | Admitting: *Deleted

## 2008-05-08 ENCOUNTER — Telehealth (INDEPENDENT_AMBULATORY_CARE_PROVIDER_SITE_OTHER): Payer: Self-pay | Admitting: *Deleted

## 2008-05-09 ENCOUNTER — Telehealth (INDEPENDENT_AMBULATORY_CARE_PROVIDER_SITE_OTHER): Payer: Self-pay | Admitting: *Deleted

## 2008-05-10 ENCOUNTER — Telehealth (INDEPENDENT_AMBULATORY_CARE_PROVIDER_SITE_OTHER): Payer: Self-pay | Admitting: *Deleted

## 2008-05-11 ENCOUNTER — Telehealth (INDEPENDENT_AMBULATORY_CARE_PROVIDER_SITE_OTHER): Payer: Self-pay | Admitting: *Deleted

## 2008-05-11 ENCOUNTER — Ambulatory Visit: Payer: Self-pay | Admitting: Internal Medicine

## 2008-05-16 ENCOUNTER — Ambulatory Visit: Payer: Self-pay | Admitting: Internal Medicine

## 2008-05-18 ENCOUNTER — Encounter: Payer: Self-pay | Admitting: Internal Medicine

## 2008-05-21 ENCOUNTER — Telehealth (INDEPENDENT_AMBULATORY_CARE_PROVIDER_SITE_OTHER): Payer: Self-pay | Admitting: *Deleted

## 2008-05-24 ENCOUNTER — Telehealth (INDEPENDENT_AMBULATORY_CARE_PROVIDER_SITE_OTHER): Payer: Self-pay | Admitting: *Deleted

## 2008-05-29 ENCOUNTER — Ambulatory Visit: Payer: Self-pay | Admitting: Internal Medicine

## 2008-05-29 ENCOUNTER — Encounter (INDEPENDENT_AMBULATORY_CARE_PROVIDER_SITE_OTHER): Payer: Self-pay | Admitting: *Deleted

## 2008-05-29 LAB — CONVERTED CEMR LAB

## 2008-05-30 ENCOUNTER — Telehealth (INDEPENDENT_AMBULATORY_CARE_PROVIDER_SITE_OTHER): Payer: Self-pay | Admitting: *Deleted

## 2008-06-01 ENCOUNTER — Telehealth (INDEPENDENT_AMBULATORY_CARE_PROVIDER_SITE_OTHER): Payer: Self-pay | Admitting: *Deleted

## 2008-06-04 ENCOUNTER — Encounter (INDEPENDENT_AMBULATORY_CARE_PROVIDER_SITE_OTHER): Payer: Self-pay | Admitting: *Deleted

## 2008-06-11 ENCOUNTER — Ambulatory Visit: Payer: Self-pay | Admitting: Internal Medicine

## 2008-06-11 ENCOUNTER — Encounter (INDEPENDENT_AMBULATORY_CARE_PROVIDER_SITE_OTHER): Payer: Self-pay | Admitting: *Deleted

## 2008-06-14 ENCOUNTER — Telehealth (INDEPENDENT_AMBULATORY_CARE_PROVIDER_SITE_OTHER): Payer: Self-pay | Admitting: *Deleted

## 2008-06-25 ENCOUNTER — Ambulatory Visit: Payer: Self-pay | Admitting: Internal Medicine

## 2008-06-26 ENCOUNTER — Telehealth (INDEPENDENT_AMBULATORY_CARE_PROVIDER_SITE_OTHER): Payer: Self-pay | Admitting: *Deleted

## 2008-07-18 ENCOUNTER — Ambulatory Visit: Payer: Self-pay | Admitting: *Deleted

## 2008-07-18 ENCOUNTER — Encounter (INDEPENDENT_AMBULATORY_CARE_PROVIDER_SITE_OTHER): Payer: Self-pay | Admitting: Internal Medicine

## 2008-07-18 DIAGNOSIS — F419 Anxiety disorder, unspecified: Secondary | ICD-10-CM

## 2008-07-18 DIAGNOSIS — F411 Generalized anxiety disorder: Secondary | ICD-10-CM

## 2008-07-18 HISTORY — DX: Anxiety disorder, unspecified: F41.9

## 2008-07-31 ENCOUNTER — Ambulatory Visit (HOSPITAL_COMMUNITY): Admission: RE | Admit: 2008-07-31 | Discharge: 2008-07-31 | Payer: Self-pay | Admitting: *Deleted

## 2008-07-31 ENCOUNTER — Encounter: Payer: Self-pay | Admitting: *Deleted

## 2008-08-01 ENCOUNTER — Telehealth (INDEPENDENT_AMBULATORY_CARE_PROVIDER_SITE_OTHER): Payer: Self-pay | Admitting: Internal Medicine

## 2008-08-07 ENCOUNTER — Telehealth (INDEPENDENT_AMBULATORY_CARE_PROVIDER_SITE_OTHER): Payer: Self-pay | Admitting: Internal Medicine

## 2008-08-07 ENCOUNTER — Encounter (INDEPENDENT_AMBULATORY_CARE_PROVIDER_SITE_OTHER): Payer: Self-pay | Admitting: *Deleted

## 2008-09-05 ENCOUNTER — Telehealth (INDEPENDENT_AMBULATORY_CARE_PROVIDER_SITE_OTHER): Payer: Self-pay | Admitting: *Deleted

## 2008-09-17 ENCOUNTER — Encounter (INDEPENDENT_AMBULATORY_CARE_PROVIDER_SITE_OTHER): Payer: Self-pay | Admitting: *Deleted

## 2008-09-17 ENCOUNTER — Ambulatory Visit: Payer: Self-pay | Admitting: Internal Medicine

## 2008-09-17 LAB — CONVERTED CEMR LAB
ALT: 13 units/L (ref 0–53)
Albumin: 4.4 g/dL (ref 3.5–5.2)
CO2: 21 meq/L (ref 19–32)
Calcium: 9.5 mg/dL (ref 8.4–10.5)
Chloride: 109 meq/L (ref 96–112)
Glucose, Bld: 207 mg/dL — ABNORMAL HIGH (ref 70–99)
Microalb Creat Ratio: 17.4 mg/g (ref 0.0–30.0)
Potassium: 5.1 meq/L (ref 3.5–5.3)
Sodium: 142 meq/L (ref 135–145)
Total Protein: 7.1 g/dL (ref 6.0–8.3)

## 2008-09-27 ENCOUNTER — Ambulatory Visit: Payer: Self-pay | Admitting: Internal Medicine

## 2008-09-27 ENCOUNTER — Encounter (INDEPENDENT_AMBULATORY_CARE_PROVIDER_SITE_OTHER): Payer: Self-pay | Admitting: *Deleted

## 2008-09-28 LAB — CONVERTED CEMR LAB
Cholesterol: 176 mg/dL (ref 0–200)
Triglycerides: 70 mg/dL (ref <150)
VLDL: 14 mg/dL (ref 0–40)

## 2008-10-04 ENCOUNTER — Ambulatory Visit: Payer: Self-pay | Admitting: Internal Medicine

## 2008-10-04 ENCOUNTER — Encounter (INDEPENDENT_AMBULATORY_CARE_PROVIDER_SITE_OTHER): Payer: Self-pay | Admitting: *Deleted

## 2008-11-01 ENCOUNTER — Encounter (INDEPENDENT_AMBULATORY_CARE_PROVIDER_SITE_OTHER): Payer: Self-pay | Admitting: *Deleted

## 2008-11-02 ENCOUNTER — Encounter (INDEPENDENT_AMBULATORY_CARE_PROVIDER_SITE_OTHER): Payer: Self-pay | Admitting: *Deleted

## 2008-11-05 ENCOUNTER — Telehealth (INDEPENDENT_AMBULATORY_CARE_PROVIDER_SITE_OTHER): Payer: Self-pay | Admitting: *Deleted

## 2008-11-19 ENCOUNTER — Telehealth (INDEPENDENT_AMBULATORY_CARE_PROVIDER_SITE_OTHER): Payer: Self-pay | Admitting: *Deleted

## 2008-11-25 ENCOUNTER — Encounter: Payer: Self-pay | Admitting: Emergency Medicine

## 2008-11-25 ENCOUNTER — Ambulatory Visit: Payer: Self-pay | Admitting: Interventional Radiology

## 2008-11-26 ENCOUNTER — Ambulatory Visit: Payer: Self-pay | Admitting: Internal Medicine

## 2008-11-26 ENCOUNTER — Inpatient Hospital Stay (HOSPITAL_COMMUNITY): Admission: EM | Admit: 2008-11-26 | Discharge: 2008-11-29 | Payer: Self-pay | Admitting: Internal Medicine

## 2008-11-28 ENCOUNTER — Telehealth (INDEPENDENT_AMBULATORY_CARE_PROVIDER_SITE_OTHER): Payer: Self-pay | Admitting: *Deleted

## 2008-11-28 ENCOUNTER — Encounter: Payer: Self-pay | Admitting: Internal Medicine

## 2008-11-28 DIAGNOSIS — J189 Pneumonia, unspecified organism: Secondary | ICD-10-CM | POA: Insufficient documentation

## 2008-11-28 DIAGNOSIS — E46 Unspecified protein-calorie malnutrition: Secondary | ICD-10-CM | POA: Insufficient documentation

## 2008-11-28 HISTORY — DX: Pneumonia, unspecified organism: J18.9

## 2008-11-29 ENCOUNTER — Ambulatory Visit: Payer: Self-pay | Admitting: Oncology

## 2008-11-29 ENCOUNTER — Encounter: Payer: Self-pay | Admitting: Internal Medicine

## 2008-12-06 ENCOUNTER — Telehealth: Payer: Self-pay | Admitting: Internal Medicine

## 2008-12-20 ENCOUNTER — Encounter (INDEPENDENT_AMBULATORY_CARE_PROVIDER_SITE_OTHER): Payer: Self-pay | Admitting: *Deleted

## 2008-12-20 LAB — CBC & DIFF AND RETIC
BASO%: 0.7 % (ref 0.0–2.0)
LYMPH%: 26.3 % (ref 14.0–49.0)
MCH: 31.5 pg (ref 27.2–33.4)
MCHC: 33.6 g/dL (ref 32.0–36.0)
MCV: 93.6 fL (ref 79.3–98.0)
MONO%: 8.4 % (ref 0.0–14.0)
Platelets: 263 10*3/uL (ref 140–400)
RBC: 3.49 10*6/uL — ABNORMAL LOW (ref 4.20–5.82)
Retic %: 1.3 % (ref 0.7–2.3)

## 2008-12-20 LAB — MORPHOLOGY

## 2008-12-24 LAB — KAPPA/LAMBDA LIGHT CHAINS: Kappa free light chain: 1.78 mg/dL (ref 0.33–1.94)

## 2008-12-24 LAB — COMPREHENSIVE METABOLIC PANEL
Albumin: 4.1 g/dL (ref 3.5–5.2)
Alkaline Phosphatase: 70 U/L (ref 39–117)
BUN: 19 mg/dL (ref 6–23)
Glucose, Bld: 40 mg/dL — ABNORMAL LOW (ref 70–99)
Total Bilirubin: 0.3 mg/dL (ref 0.3–1.2)

## 2008-12-24 LAB — IMMUNOFIXATION ELECTROPHORESIS
IgA: 356 mg/dL (ref 68–378)
IgG (Immunoglobin G), Serum: 939 mg/dL (ref 694–1618)
IgM, Serum: 62 mg/dL (ref 60–263)
Total Protein, Serum Electrophoresis: 6.9 g/dL (ref 6.0–8.3)

## 2008-12-27 ENCOUNTER — Encounter (INDEPENDENT_AMBULATORY_CARE_PROVIDER_SITE_OTHER): Payer: Self-pay | Admitting: *Deleted

## 2008-12-28 ENCOUNTER — Ambulatory Visit: Payer: Self-pay | Admitting: Internal Medicine

## 2008-12-28 ENCOUNTER — Encounter (INDEPENDENT_AMBULATORY_CARE_PROVIDER_SITE_OTHER): Payer: Self-pay | Admitting: *Deleted

## 2008-12-28 LAB — CONVERTED CEMR LAB: Blood Glucose, Fingerstick: 222

## 2009-01-03 ENCOUNTER — Ambulatory Visit: Payer: Self-pay | Admitting: Internal Medicine

## 2009-01-10 ENCOUNTER — Telehealth (INDEPENDENT_AMBULATORY_CARE_PROVIDER_SITE_OTHER): Payer: Self-pay | Admitting: *Deleted

## 2009-01-16 ENCOUNTER — Ambulatory Visit: Payer: Self-pay | Admitting: Oncology

## 2009-01-17 ENCOUNTER — Encounter: Payer: Self-pay | Admitting: Internal Medicine

## 2009-01-17 ENCOUNTER — Encounter (INDEPENDENT_AMBULATORY_CARE_PROVIDER_SITE_OTHER): Payer: Self-pay | Admitting: *Deleted

## 2009-01-18 ENCOUNTER — Telehealth (INDEPENDENT_AMBULATORY_CARE_PROVIDER_SITE_OTHER): Payer: Self-pay | Admitting: *Deleted

## 2009-01-18 LAB — CBC & DIFF AND RETIC
Eosinophils Absolute: 0.1 10*3/uL (ref 0.0–0.5)
HGB: 11 g/dL — ABNORMAL LOW (ref 13.0–17.1)
LYMPH%: 19.2 % (ref 14.0–49.0)
MONO#: 0.4 10*3/uL (ref 0.1–0.9)
NEUT#: 3.9 10*3/uL (ref 1.5–6.5)
Platelets: 292 10*3/uL (ref 140–400)
RBC: 3.51 10*6/uL — ABNORMAL LOW (ref 4.20–5.82)
RDW: 14 % (ref 11.0–14.6)
RETIC #: 29.1 10*3/uL — ABNORMAL LOW (ref 31.8–103.9)
Retic %: 0.8 % (ref 0.7–2.3)
WBC: 5.6 10*3/uL (ref 4.0–10.3)

## 2009-01-18 LAB — MORPHOLOGY: PLT EST: ADEQUATE

## 2009-01-22 LAB — KAPPA/LAMBDA LIGHT CHAINS
Kappa free light chain: 1.67 mg/dL (ref 0.33–1.94)
Kappa:Lambda Ratio: 1.14 (ref 0.26–1.65)

## 2009-01-22 LAB — COMPREHENSIVE METABOLIC PANEL
AST: 22 U/L (ref 0–37)
BUN: 25 mg/dL — ABNORMAL HIGH (ref 6–23)
CO2: 25 mEq/L (ref 19–32)
Chloride: 108 mEq/L (ref 96–112)
Creatinine, Ser: 0.91 mg/dL (ref 0.40–1.50)
Glucose, Bld: 40 mg/dL — ABNORMAL LOW (ref 70–99)
Potassium: 5.4 mEq/L — ABNORMAL HIGH (ref 3.5–5.3)
Total Protein: 7.3 g/dL (ref 6.0–8.3)

## 2009-01-22 LAB — IMMUNOFIXATION ELECTROPHORESIS

## 2009-01-24 ENCOUNTER — Telehealth (INDEPENDENT_AMBULATORY_CARE_PROVIDER_SITE_OTHER): Payer: Self-pay | Admitting: *Deleted

## 2009-01-28 ENCOUNTER — Telehealth (INDEPENDENT_AMBULATORY_CARE_PROVIDER_SITE_OTHER): Payer: Self-pay | Admitting: *Deleted

## 2009-02-19 LAB — CBC & DIFF AND RETIC
BASO%: 0.5 % (ref 0.0–2.0)
EOS%: 2.8 % (ref 0.0–7.0)
HCT: 34.4 % — ABNORMAL LOW (ref 38.4–49.9)
IRF: 0.22 (ref 0.070–0.380)
LYMPH%: 25.5 % (ref 14.0–49.0)
MCH: 32.2 pg (ref 27.2–33.4)
MCHC: 35.2 g/dL (ref 32.0–36.0)
NEUT%: 62.3 % (ref 39.0–75.0)
lymph#: 1.4 10*3/uL (ref 0.9–3.3)

## 2009-02-19 LAB — MORPHOLOGY: PLT EST: ADEQUATE

## 2009-02-19 LAB — COMPREHENSIVE METABOLIC PANEL
Albumin: 4.2 g/dL (ref 3.5–5.2)
Alkaline Phosphatase: 55 U/L (ref 39–117)
CO2: 28 mEq/L (ref 19–32)
Calcium: 9.3 mg/dL (ref 8.4–10.5)
Chloride: 106 mEq/L (ref 96–112)
Glucose, Bld: 107 mg/dL — ABNORMAL HIGH (ref 70–99)
Potassium: 4.4 mEq/L (ref 3.5–5.3)
Sodium: 144 mEq/L (ref 135–145)
Total Protein: 7.5 g/dL (ref 6.0–8.3)

## 2009-02-21 LAB — IMMUNOFIXATION ELECTROPHORESIS
IgG (Immunoglobin G), Serum: 906 mg/dL (ref 694–1618)
Total Protein, Serum Electrophoresis: 7.1 g/dL (ref 6.0–8.3)

## 2009-02-21 LAB — KAPPA/LAMBDA LIGHT CHAINS: Kappa free light chain: 1.34 mg/dL (ref 0.33–1.94)

## 2009-03-04 ENCOUNTER — Encounter (INDEPENDENT_AMBULATORY_CARE_PROVIDER_SITE_OTHER): Payer: Self-pay | Admitting: *Deleted

## 2009-03-04 ENCOUNTER — Ambulatory Visit: Payer: Self-pay | Admitting: Internal Medicine

## 2009-03-04 DIAGNOSIS — M25519 Pain in unspecified shoulder: Secondary | ICD-10-CM | POA: Insufficient documentation

## 2009-03-04 LAB — CONVERTED CEMR LAB: PSA: 0.3 ng/mL (ref 0.10–4.00)

## 2009-03-06 ENCOUNTER — Ambulatory Visit (HOSPITAL_COMMUNITY): Admission: RE | Admit: 2009-03-06 | Discharge: 2009-03-06 | Payer: Self-pay | Admitting: Internal Medicine

## 2009-03-18 ENCOUNTER — Encounter: Payer: Self-pay | Admitting: Internal Medicine

## 2009-03-19 ENCOUNTER — Ambulatory Visit: Payer: Self-pay | Admitting: Oncology

## 2009-03-20 ENCOUNTER — Encounter: Payer: Self-pay | Admitting: Internal Medicine

## 2009-03-21 LAB — CBC WITH DIFFERENTIAL/PLATELET
BASO%: 0.3 % (ref 0.0–2.0)
HCT: 31.5 % — ABNORMAL LOW (ref 38.4–49.9)
LYMPH%: 20.4 % (ref 14.0–49.0)
MCH: 32.3 pg (ref 27.2–33.4)
MCHC: 34.9 g/dL (ref 32.0–36.0)
MCV: 92.6 fL (ref 79.3–98.0)
MONO#: 0.5 10*3/uL (ref 0.1–0.9)
NEUT%: 67.4 % (ref 39.0–75.0)
Platelets: 254 10*3/uL (ref 140–400)
WBC: 5.4 10*3/uL (ref 4.0–10.3)

## 2009-03-22 ENCOUNTER — Ambulatory Visit: Payer: Self-pay | Admitting: Infectious Diseases

## 2009-03-22 LAB — CONVERTED CEMR LAB
Blood Glucose, Fingerstick: 319
Hgb A1c MFr Bld: 7.9 %

## 2009-04-01 ENCOUNTER — Encounter: Admission: RE | Admit: 2009-04-01 | Discharge: 2009-04-01 | Payer: Self-pay | Admitting: Orthopedic Surgery

## 2009-04-03 ENCOUNTER — Telehealth (INDEPENDENT_AMBULATORY_CARE_PROVIDER_SITE_OTHER): Payer: Self-pay | Admitting: *Deleted

## 2009-04-15 ENCOUNTER — Ambulatory Visit: Payer: Self-pay | Admitting: Internal Medicine

## 2009-04-19 ENCOUNTER — Telehealth (INDEPENDENT_AMBULATORY_CARE_PROVIDER_SITE_OTHER): Payer: Self-pay | Admitting: *Deleted

## 2009-04-22 ENCOUNTER — Ambulatory Visit: Payer: Self-pay | Admitting: Oncology

## 2009-04-24 LAB — CBC WITH DIFFERENTIAL/PLATELET
BASO%: 0.5 % (ref 0.0–2.0)
Basophils Absolute: 0 10*3/uL (ref 0.0–0.1)
EOS%: 3.7 % (ref 0.0–7.0)
Eosinophils Absolute: 0.2 10*3/uL (ref 0.0–0.5)
HCT: 30 % — ABNORMAL LOW (ref 38.4–49.9)
HGB: 10.3 g/dL — ABNORMAL LOW (ref 13.0–17.1)
LYMPH%: 30.2 % (ref 14.0–49.0)
MCH: 32.4 pg (ref 27.2–33.4)
MCHC: 34.3 g/dL (ref 32.0–36.0)
MCV: 94.4 fL (ref 79.3–98.0)
MONO#: 0.5 10*3/uL (ref 0.1–0.9)
MONO%: 11.1 % (ref 0.0–14.0)
NEUT#: 2.6 10*3/uL (ref 1.5–6.5)
NEUT%: 54.5 % (ref 39.0–75.0)
Platelets: 237 10*3/uL (ref 140–400)
RBC: 3.17 10*6/uL — ABNORMAL LOW (ref 4.20–5.82)
RDW: 14 % (ref 11.0–14.6)
WBC: 4.8 10*3/uL (ref 4.0–10.3)
lymph#: 1.5 10*3/uL (ref 0.9–3.3)

## 2009-04-24 LAB — BASIC METABOLIC PANEL
CO2: 24 mEq/L (ref 19–32)
Chloride: 108 mEq/L (ref 96–112)
Creatinine, Ser: 1.17 mg/dL (ref 0.40–1.50)
Potassium: 5.5 mEq/L — ABNORMAL HIGH (ref 3.5–5.3)

## 2009-04-24 LAB — RETICULOCYTES
Immature Retic Fract: 1.7 % (ref 0.00–13.40)
Retic %: 0.87 % (ref 0.50–1.60)
Retic Ct Abs: 28.8 10*3/uL (ref 24.10–77.50)

## 2009-04-26 LAB — IRON AND TIBC
Iron: 68 ug/dL (ref 42–165)
TIBC: 286 ug/dL (ref 215–435)
UIBC: 218 ug/dL

## 2009-04-26 LAB — HEMOGLOBINOPATHY EVALUATION
Hgb A2 Quant: 2.3 % (ref 2.2–3.2)
Hgb A: 97.7 % (ref 96.8–97.8)

## 2009-04-26 LAB — VITAMIN B12: Vitamin B-12: >2000 pg/mL — ABNORMAL HIGH (ref 211–911)

## 2009-04-26 LAB — FOLATE: Folate: >20 ng/mL

## 2009-04-26 LAB — ERYTHROPOIETIN: Erythropoietin: 9.9 m[IU]/mL (ref 2.6–34.0)

## 2009-04-29 ENCOUNTER — Encounter: Payer: Self-pay | Admitting: Internal Medicine

## 2009-06-17 ENCOUNTER — Ambulatory Visit: Payer: Self-pay | Admitting: Oncology

## 2009-06-19 LAB — CBC WITH DIFFERENTIAL/PLATELET
Basophils Absolute: 0 10*3/uL (ref 0.0–0.1)
Eosinophils Absolute: 0.2 10*3/uL (ref 0.0–0.5)
HCT: 31.6 % — ABNORMAL LOW (ref 38.4–49.9)
HGB: 10.9 g/dL — ABNORMAL LOW (ref 13.0–17.1)
MCV: 94.8 fL (ref 79.3–98.0)
MONO%: 8.8 % (ref 0.0–14.0)
NEUT#: 3.1 10*3/uL (ref 1.5–6.5)
NEUT%: 62.1 % (ref 39.0–75.0)
Platelets: 223 10*3/uL (ref 140–400)
RDW: 13 % (ref 11.0–14.6)

## 2009-07-03 ENCOUNTER — Ambulatory Visit: Payer: Self-pay | Admitting: Internal Medicine

## 2009-08-19 ENCOUNTER — Telehealth: Payer: Self-pay | Admitting: Internal Medicine

## 2009-08-20 ENCOUNTER — Ambulatory Visit: Payer: Self-pay | Admitting: Oncology

## 2009-08-20 ENCOUNTER — Telehealth: Payer: Self-pay | Admitting: Internal Medicine

## 2009-08-22 LAB — IRON AND TIBC
Iron: 61 ug/dL (ref 42–165)
UIBC: 253 ug/dL

## 2009-08-22 LAB — CBC WITH DIFFERENTIAL/PLATELET
BASO%: 0.3 % (ref 0.0–2.0)
EOS%: 3.1 % (ref 0.0–7.0)
MCH: 33.4 pg (ref 27.2–33.4)
MCHC: 34.5 g/dL (ref 32.0–36.0)
MCV: 96.9 fL (ref 79.3–98.0)
MONO%: 9.1 % (ref 0.0–14.0)
RBC: 3.36 10*6/uL — ABNORMAL LOW (ref 4.20–5.82)
RDW: 13.7 % (ref 11.0–14.6)
lymph#: 1 10*3/uL (ref 0.9–3.3)

## 2009-08-22 LAB — TSH: TSH: 2.401 u[IU]/mL (ref 0.350–4.500)

## 2009-08-22 LAB — FERRITIN: Ferritin: 213 ng/mL (ref 22–322)

## 2009-10-03 ENCOUNTER — Telehealth (INDEPENDENT_AMBULATORY_CARE_PROVIDER_SITE_OTHER): Payer: Self-pay | Admitting: *Deleted

## 2009-12-05 ENCOUNTER — Ambulatory Visit: Payer: Self-pay | Admitting: Internal Medicine

## 2009-12-05 LAB — CONVERTED CEMR LAB

## 2009-12-16 ENCOUNTER — Ambulatory Visit: Payer: Self-pay | Admitting: Oncology

## 2009-12-18 LAB — CBC WITH DIFFERENTIAL/PLATELET
BASO%: 0.4 % (ref 0.0–2.0)
Basophils Absolute: 0 10*3/uL (ref 0.0–0.1)
EOS%: 3.5 % (ref 0.0–7.0)
Eosinophils Absolute: 0.2 10*3/uL (ref 0.0–0.5)
HCT: 32.3 % — ABNORMAL LOW (ref 38.4–49.9)
HGB: 10.8 g/dL — ABNORMAL LOW (ref 13.0–17.1)
LYMPH%: 16.8 % (ref 14.0–49.0)
MCH: 32.4 pg (ref 27.2–33.4)
MCHC: 33.6 g/dL (ref 32.0–36.0)
MCV: 96.5 fL (ref 79.3–98.0)
MONO#: 0.7 10*3/uL (ref 0.1–0.9)
MONO%: 11.3 % (ref 0.0–14.0)
NEUT#: 3.9 10*3/uL (ref 1.5–6.5)
NEUT%: 68 % (ref 39.0–75.0)
Platelets: 245 10*3/uL (ref 140–400)
RBC: 3.34 10*6/uL — ABNORMAL LOW (ref 4.20–5.82)
RDW: 13.2 % (ref 11.0–14.6)
WBC: 5.8 10*3/uL (ref 4.0–10.3)
lymph#: 1 10*3/uL (ref 0.9–3.3)

## 2010-02-26 ENCOUNTER — Encounter (HOSPITAL_BASED_OUTPATIENT_CLINIC_OR_DEPARTMENT_OTHER): Admission: RE | Admit: 2010-02-26 | Discharge: 2010-05-27 | Payer: Self-pay | Admitting: Internal Medicine

## 2010-04-14 ENCOUNTER — Ambulatory Visit: Payer: Self-pay | Admitting: Oncology

## 2010-04-23 ENCOUNTER — Telehealth: Payer: Self-pay | Admitting: Internal Medicine

## 2010-04-30 LAB — CBC WITH DIFFERENTIAL/PLATELET
Basophils Absolute: 0 10*3/uL (ref 0.0–0.1)
EOS%: 4 % (ref 0.0–7.0)
HCT: 32.3 % — ABNORMAL LOW (ref 38.4–49.9)
HGB: 11.2 g/dL — ABNORMAL LOW (ref 13.0–17.1)
MCH: 32.8 pg (ref 27.2–33.4)
MCHC: 34.7 g/dL (ref 32.0–36.0)
MCV: 94.8 fL (ref 79.3–98.0)
MONO%: 8.1 % (ref 0.0–14.0)
NEUT%: 63.5 % (ref 39.0–75.0)
RDW: 13.2 % (ref 11.0–14.6)

## 2010-05-01 ENCOUNTER — Encounter: Payer: Self-pay | Admitting: Internal Medicine

## 2010-08-05 ENCOUNTER — Ambulatory Visit: Payer: Self-pay | Admitting: Oncology

## 2010-08-06 ENCOUNTER — Emergency Department (HOSPITAL_COMMUNITY)
Admission: EM | Admit: 2010-08-06 | Discharge: 2010-08-06 | Payer: Self-pay | Source: Home / Self Care | Admitting: Emergency Medicine

## 2010-09-28 ENCOUNTER — Encounter: Payer: Self-pay | Admitting: *Deleted

## 2010-10-09 NOTE — Assessment & Plan Note (Signed)
Summary: diabetes/dmr  Is Patient Diabetic? Yes Did you bring your meter with you today? Yes   Allergies: 1)  ! * Bee Stings   Complete Medication List: 1)  Synthroid 50 Mcg Tabs (Levothyroxine sodium) .... Take 1 tablet by mouth once a day 2)  Vicodin 5-500 Mg Tabs (Hydrocodone-acetaminophen) .... Take 1 tablet by mouth four  times a day as needed for leg pain. 3)  Ritalin La 40 Mg Cp24 (Methylphenidate hcl) .... Take 1 tablet by mouth four times a day, please try to wean yourself off this by taking 3 a day for 1 week, 2 a day for a week, 1 a day for a week, then stop 4)  Hydrochlorothiazide 25 Mg Tabs (Hydrochlorothiazide) .... Take 1 tablet by mouth once a day 5)  Viagra 50 Mg Tabs (Sildenafil citrate) .... Take one pill 1/2 hour to 4 hours prior to sexual intercourse.  do not take more than one pill per day.  do not take nitroglycerine while taking this medication 6)  Accu-chek Soft Touch Lancets Misc (Lancets) .... Use to test blood glucose 6-8 times a day 7)  Accu-chek Compact Test Drum Strp (Glucose blood) .... Use to test blood glucode 6-8 times a day 8)  Ketostix Strp (Acetone (urine) test) .... Use to test ketones when blood sugar more than 250 mg/dl 9)  Ferrous Sulfate 161 (65 Fe) Mg Tbec (Ferrous sulfate) .... Take 1 tablet by mouth three times a day 10)  Lomotil 2.5-0.025 Mg Tabs (Diphenoxylate-atropine) .... Take 1-2 tablets by mouth two times a day as needed 11)  Humalog 100 Unit/ml Soln (Insulin lispro (human)) .... Needs 5 3ml cartriges for pen. use as directed. 12)  Pen Needles 31g X 6 Mm Misc (Insulin pen needle) .... Use to inject insulin 4x - 5x daily 13)  Lantus Solostar 100 Unit/ml Soln (Insulin glargine) .... Use to inject 11 units in the evening and 8 units in the morning. 14)  Flomax 0.4 Mg Xr24h-cap (Tamsulosin hcl) .... 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 better than in the past.   Coping with Diabetes Feelings about Diabetes: Action- some frustration verbalized today Would you say you are: unhappy with diabetes control, lack of sleep  Diabetes Medications:  Comments: showed me record keeping sheets from endocrinology: asvised by endocrinologist to use a 1:20 Insulin to carb ratio and a 1: 75 correction factor. Patient reprots he is taking some insulin doses after meals and still having great varbiability. ( Unable to download meter due to computer access here in clinic)  .spitting lantus dose and asking about timing of it as well. I stressed consistency of both insulins and timing. Continues to have unexplained hypoglycemia that he reports are frightening. was awoken by a CBgs of 44 this week after esting a few hours before and CBg was in [facility] and only lantus on board between 8 am and noon when this occurred. Continues to have sleep problems    Monitoring Self monitoring blood glucose 6+ times a day Name of Meter  accucheck compact plus Measures urine ketones? No  Time of Testing  Before meals  After meals  Recent Episodes of: Requiring Help from another person  DKA: No Hyperglycemia : Yes Hypoglycemia: Yes HHNK: No Severe Hypoglycemia : Yes   Carrys Food for Low Blood sugar Yes Can you tell if your blood sugar is low? Yes    Nutrition assessment Weight change: no change Do you read food labels?                                                                          Yes What do you look at?                                                                                                                  Diligent about carb counting and is doing much better at record keeping.  Activity Limitations  Appropriate physical activity Diabetes Disease Process  Discussed today  Medications State appropriate timing of food related to medication: Demonstrates competency   Describe safe needle/lancet disposal: Demonstrates competency    Nutritional Management State changes planned for home meals/snacks: Needs review/assistance    Monitoring  Complications State the causes-signs and symptoms and prevention of Hyperglycemia: Demonstrates competency   Explain proper treatment of hyperglycemia: Demonstrates competency   State the causes- signs and symptoms and prevention of hypoglycemia: Demonstrates competency   Explain proper treatment of hypoglycemia: Demonstrates competency    Exercise  Lifestyle changes:Goal setting and Problem solving Develop strategies to reduce risk factors: Needs review/assistance   List at least two appropriate community resources: Demonstrates competency   Diabetes Management Education Done: 12/06/2009    BEHAVIORAL GOALS INITIAL Utilizing medications if for therapeutic effectiveness: continue to work on timing of insulin         Patient requesting diabetes support. Seems to be coping better with fluctuations of blood sugars. provided correction scale with lag times per Dr. Remus Blake correction factor order. to assist patient with timing of meal tme insulin.  Diabetes Self Management Support: Dr. Remus Blake office, family and our office.  Follow-up: as needed

## 2010-10-09 NOTE — Progress Notes (Signed)
Summary: diabetes support/dmr  Phone Note Call from Patient Call back at Home Phone [phone]   Caller: Patient Summary of Call: called to make an appointment wiht CDE and if he'd have a pneumonia shot while inthe hospital for pneumonia last year. Initial call taken by: Jamison Neighbor RD,CDE,  October 03, 2009 5:02 PM  Follow-up for Phone Call        called  and left messge for patient that he has had pnuemonia vaccine 11/28/2008 and to call as needed for CDE appointment Follow-up by: Jamison Neighbor RD,CDE,  October 10, 2009 1:21 PM

## 2010-10-09 NOTE — Medication Information (Signed)
Summary: Duaine Dredge for Lomotil/[facility]  Refill autho for Lomotil/[facility]   Imported By: Sherian Rein 05/05/2010 08:50:14  _____________________________________________________________________  External Attachment:    Type:   Image     Comment:   External Document

## 2010-10-09 NOTE — Progress Notes (Signed)
Summary: refill/gg  Phone Note Refill Request  on April 23, 2010 3:37 PM  Refills Requested: Medication #1:  PEN NEEDLES 31G X 6 MM MISC use to inject insulin 4x - 5x daily  Method Requested: Electronic Initial call taken by: Merrie Roof RN,  April 23, 2010 3:38 PM  Follow-up for Phone Call        Refill approved-nurse to complete    Prescriptions: PEN NEEDLES 31G X 6 MM MISC (INSULIN PEN NEEDLE) use to inject insulin 4x - 5x daily  #200 x 11   Entered and Authorized by:   Vassie Loll MD   Signed by:   Vassie Loll MD on 04/25/2010   Method used:   Electronically to        [id number] (retail)       [address]       [place], [place]  [address]       Ph: [phone]       Fax: [phone]   RxID:   [id number]

## 2010-11-06 ENCOUNTER — Other Ambulatory Visit: Payer: Self-pay | Admitting: Oncology

## 2010-11-06 ENCOUNTER — Encounter (HOSPITAL_BASED_OUTPATIENT_CLINIC_OR_DEPARTMENT_OTHER): Payer: Medicare Other | Admitting: Oncology

## 2010-11-06 DIAGNOSIS — E119 Type 2 diabetes mellitus without complications: Secondary | ICD-10-CM

## 2010-11-06 DIAGNOSIS — D649 Anemia, unspecified: Secondary | ICD-10-CM

## 2010-11-06 DIAGNOSIS — R5381 Other malaise: Secondary | ICD-10-CM

## 2010-11-06 LAB — COMPREHENSIVE METABOLIC PANEL
Albumin: 4 g/dL (ref 3.5–5.2)
Alkaline Phosphatase: 70 U/L (ref 39–117)
Glucose, Bld: 80 mg/dL (ref 70–99)
Potassium: 5 mEq/L (ref 3.5–5.3)
Sodium: 141 mEq/L (ref 135–145)
Total Protein: 6.8 g/dL (ref 6.0–8.3)

## 2010-11-06 LAB — CBC WITH DIFFERENTIAL/PLATELET
Eosinophils Absolute: 0.2 10*3/uL (ref 0.0–0.5)
MONO#: 0.5 10*3/uL (ref 0.1–0.9)
MONO%: 7.2 % (ref 0.0–14.0)
NEUT#: 5 10*3/uL (ref 1.5–6.5)
RBC: 3.52 10*6/uL — ABNORMAL LOW (ref 4.20–5.82)
RDW: 13 % (ref 11.0–14.6)
WBC: 6.8 10*3/uL (ref 4.0–10.3)

## 2010-11-18 LAB — COMPREHENSIVE METABOLIC PANEL
AST: 28 U/L (ref 0–37)
Albumin: 4.3 g/dL (ref 3.5–5.2)
Calcium: 9.1 mg/dL (ref 8.4–10.5)
Chloride: 104 mEq/L (ref 96–112)
Creatinine, Ser: 1.11 mg/dL (ref 0.4–1.5)
Total Protein: 7 g/dL (ref 6.0–8.3)

## 2010-11-18 LAB — POCT I-STAT, CHEM 8
BUN: 23 mg/dL (ref 6–23)
Creatinine, Ser: 1.2 mg/dL (ref 0.4–1.5)
Glucose, Bld: 196 mg/dL — ABNORMAL HIGH (ref 70–99)
Potassium: 4.6 mEq/L (ref 3.5–5.1)
Sodium: 139 mEq/L (ref 135–145)
TCO2: 21 mmol/L (ref 0–100)

## 2010-11-18 LAB — CBC
MCH: 31.8 pg (ref 26.0–34.0)
MCHC: 33.7 g/dL (ref 30.0–36.0)
Platelets: 265 10*3/uL (ref 150–400)
RBC: 3.46 MIL/uL — ABNORMAL LOW (ref 4.22–5.81)
RDW: 13.3 % (ref 11.5–15.5)

## 2010-11-27 ENCOUNTER — Encounter: Payer: Self-pay | Admitting: Internal Medicine

## 2010-12-17 LAB — GLUCOSE, CAPILLARY: Glucose-Capillary: 222 mg/dL — ABNORMAL HIGH (ref 70–99)

## 2010-12-18 LAB — BASIC METABOLIC PANEL WITH GFR
BUN: 13 mg/dL (ref 6–23)
BUN: 13 mg/dL (ref 6–23)
CO2: 25 meq/L (ref 19–32)
CO2: 26 meq/L (ref 19–32)
Calcium: 8.4 mg/dL (ref 8.4–10.5)
Calcium: 8.6 mg/dL (ref 8.4–10.5)
Chloride: 102 meq/L (ref 96–112)
Chloride: 105 meq/L (ref 96–112)
Creatinine, Ser: 0.85 mg/dL (ref 0.4–1.5)
Creatinine, Ser: 1 mg/dL (ref 0.4–1.5)
GFR calc non Af Amer: >60 mL/min
GFR calc non Af Amer: >60 mL/min
Glucose, Bld: 156 mg/dL — ABNORMAL HIGH (ref 70–99)
Glucose, Bld: 246 mg/dL — ABNORMAL HIGH (ref 70–99)
Potassium: 4.1 meq/L (ref 3.5–5.1)
Potassium: 4.2 meq/L (ref 3.5–5.1)
Sodium: 137 meq/L (ref 135–145)
Sodium: 138 meq/L (ref 135–145)

## 2010-12-18 LAB — HEPATIC FUNCTION PANEL
ALT: 12 U/L (ref 0–53)
AST: 14 U/L (ref 0–37)
Albumin: 2.5 g/dL — ABNORMAL LOW (ref 3.5–5.2)
Alkaline Phosphatase: 79 U/L (ref 39–117)
Bilirubin, Direct: <0.1 mg/dL (ref 0.0–0.3)
Total Bilirubin: 0.4 mg/dL (ref 0.3–1.2)
Total Protein: 6.6 g/dL (ref 6.0–8.3)

## 2010-12-18 LAB — LIPID PANEL
HDL: 25 mg/dL — ABNORMAL LOW (ref >39)
LDL Cholesterol: 78 mg/dL (ref 0–99)
Total CHOL/HDL Ratio: 4.9 RATIO
Triglycerides: 96 mg/dL (ref <150)
VLDL: 19 mg/dL (ref 0–40)

## 2010-12-18 LAB — GLUCOSE, CAPILLARY
Glucose-Capillary: 109 mg/dL — ABNORMAL HIGH (ref 70–99)
Glucose-Capillary: 126 mg/dL — ABNORMAL HIGH (ref 70–99)
Glucose-Capillary: 136 mg/dL — ABNORMAL HIGH (ref 70–99)
Glucose-Capillary: 150 mg/dL — ABNORMAL HIGH (ref 70–99)
Glucose-Capillary: 157 mg/dL — ABNORMAL HIGH (ref 70–99)
Glucose-Capillary: 163 mg/dL — ABNORMAL HIGH (ref 70–99)
Glucose-Capillary: 231 mg/dL — ABNORMAL HIGH (ref 70–99)
Glucose-Capillary: 244 mg/dL — ABNORMAL HIGH (ref 70–99)
Glucose-Capillary: 248 mg/dL — ABNORMAL HIGH (ref 70–99)
Glucose-Capillary: 295 mg/dL — ABNORMAL HIGH (ref 70–99)
Glucose-Capillary: 352 mg/dL — ABNORMAL HIGH (ref 70–99)
Glucose-Capillary: 358 mg/dL — ABNORMAL HIGH (ref 70–99)
Glucose-Capillary: 362 mg/dL — ABNORMAL HIGH (ref 70–99)
Glucose-Capillary: 367 mg/dL — ABNORMAL HIGH (ref 70–99)
Glucose-Capillary: 455 mg/dL — ABNORMAL HIGH (ref 70–99)
Glucose-Capillary: 58 mg/dL — ABNORMAL LOW (ref 70–99)
Glucose-Capillary: 75 mg/dL (ref 70–99)
Glucose-Capillary: 355 mg/dL — ABNORMAL HIGH (ref 70–99)

## 2010-12-18 LAB — CBC
HCT: 23.4 % — ABNORMAL LOW (ref 39.0–52.0)
HCT: 25.9 % — ABNORMAL LOW (ref 39.0–52.0)
HCT: 28.6 % — ABNORMAL LOW (ref 39.0–52.0)
Hemoglobin: 8.1 g/dL — ABNORMAL LOW (ref 13.0–17.0)
Hemoglobin: 9 g/dL — ABNORMAL LOW (ref 13.0–17.0)
Hemoglobin: 9.7 g/dL — ABNORMAL LOW (ref 13.0–17.0)
MCHC: 34.5 g/dL (ref 30.0–36.0)
MCHC: 34.7 g/dL (ref 30.0–36.0)
MCHC: 35 g/dL (ref 30.0–36.0)
MCHC: 33.9 g/dL (ref 30.0–36.0)
MCV: 93.2 fL (ref 78.0–100.0)
MCV: 93.2 fL (ref 78.0–100.0)
MCV: 94 fL (ref 78.0–100.0)
Platelets: 482 10*3/uL — ABNORMAL HIGH (ref 150–400)
Platelets: 554 10*3/uL — ABNORMAL HIGH (ref 150–400)
Platelets: 538 10*3/uL — ABNORMAL HIGH (ref 150–400)
RBC: 2.5 MIL/uL — ABNORMAL LOW (ref 4.22–5.81)
RBC: 2.51 MIL/uL — ABNORMAL LOW (ref 4.22–5.81)
RBC: 3.05 MIL/uL — ABNORMAL LOW (ref 4.22–5.81)
RDW: 12.4 % (ref 11.5–15.5)
RDW: 11.7 % (ref 11.5–15.5)
WBC: 12.1 10*3/uL — ABNORMAL HIGH (ref 4.0–10.5)
WBC: 12.3 10*3/uL — ABNORMAL HIGH (ref 4.0–10.5)
WBC: 14.8 10*3/uL — ABNORMAL HIGH (ref 4.0–10.5)
WBC: 16.5 10*3/uL — ABNORMAL HIGH (ref 4.0–10.5)

## 2010-12-18 LAB — HEMOCCULT GUIAC POC 1CARD (OFFICE): Fecal Occult Bld: NEGATIVE

## 2010-12-18 LAB — COMPREHENSIVE METABOLIC PANEL WITH GFR
ALT: 5 U/L (ref 0–53)
AST: 35 U/L (ref 0–37)
Albumin: 3.3 g/dL — ABNORMAL LOW (ref 3.5–5.2)
Alkaline Phosphatase: 93 U/L (ref 39–117)
BUN: 29 mg/dL — ABNORMAL HIGH (ref 6–23)
CO2: 30 meq/L (ref 19–32)
Calcium: 8.5 mg/dL (ref 8.4–10.5)
Chloride: 96 meq/L (ref 96–112)
Creatinine, Ser: 1.1 mg/dL (ref 0.4–1.5)
GFR calc non Af Amer: >60 mL/min
Glucose, Bld: 378 mg/dL — ABNORMAL HIGH (ref 70–99)
Potassium: 5.2 meq/L — ABNORMAL HIGH (ref 3.5–5.1)
Sodium: 135 meq/L (ref 135–145)
Total Bilirubin: 0.9 mg/dL (ref 0.3–1.2)
Total Protein: 7.2 g/dL (ref 6.0–8.3)

## 2010-12-18 LAB — DRUGS OF ABUSE SCREEN W/O ALC, ROUTINE URINE
Creatinine,U: 54.8 mg/dL
Marijuana Metabolite: NEGATIVE
Propoxyphene: NEGATIVE

## 2010-12-18 LAB — COMPREHENSIVE METABOLIC PANEL
AST: 15 U/L (ref 0–37)
Albumin: 2.4 g/dL — ABNORMAL LOW (ref 3.5–5.2)
Alkaline Phosphatase: 86 U/L (ref 39–117)
Chloride: 102 mEq/L (ref 96–112)
Creatinine, Ser: 1.01 mg/dL (ref 0.4–1.5)
GFR calc Af Amer: >60 mL/min (ref >60)
Potassium: 5.1 mEq/L (ref 3.5–5.1)
Sodium: 139 mEq/L (ref 135–145)
Total Bilirubin: 0.4 mg/dL (ref 0.3–1.2)

## 2010-12-18 LAB — RETICULOCYTES: Retic Count, Absolute: 51.3 10*3/uL (ref 19.0–186.0)

## 2010-12-18 LAB — URINALYSIS, ROUTINE W REFLEX MICROSCOPIC
Bilirubin Urine: NEGATIVE
Ketones, ur: NEGATIVE mg/dL
Nitrite: NEGATIVE
Specific Gravity, Urine: 1.016 (ref 1.005–1.030)
Urobilinogen, UA: 1 mg/dL (ref 0.0–1.0)
pH: 7.5 (ref 5.0–8.0)

## 2010-12-18 LAB — IRON AND TIBC: UIBC: 178 ug/dL

## 2010-12-18 LAB — DIFFERENTIAL
Basophils Absolute: 0.1 10*3/uL (ref 0.0–0.1)
Basophils Absolute: 0.3 10*3/uL — ABNORMAL HIGH (ref 0.0–0.1)
Basophils Relative: 2 % — ABNORMAL HIGH (ref 0–1)
Eosinophils Relative: 1 % (ref 0–5)
Eosinophils Relative: 1 % (ref 0–5)
Lymphocytes Relative: 10 % — ABNORMAL LOW (ref 12–46)
Lymphocytes Relative: 8 % — ABNORMAL LOW (ref 12–46)
Monocytes Absolute: 1.6 10*3/uL — ABNORMAL HIGH (ref 0.1–1.0)
Monocytes Absolute: 1.5 10*3/uL — ABNORMAL HIGH (ref 0.1–1.0)
Neutro Abs: 13.4 10*3/uL — ABNORMAL HIGH (ref 1.7–7.7)

## 2010-12-18 LAB — BASIC METABOLIC PANEL
BUN: 12 mg/dL (ref 6–23)
Calcium: 8.3 mg/dL — ABNORMAL LOW (ref 8.4–10.5)
Creatinine, Ser: 0.89 mg/dL (ref 0.4–1.5)
GFR calc Af Amer: >60 mL/min (ref >60)

## 2010-12-18 LAB — CORTISOL: Cortisol, Plasma: 14.2 ug/dL

## 2010-12-18 LAB — OPIATE, QUANTITATIVE, URINE
Hydrocodone: 700 ng/mL
Oxycodone, ur: NEGATIVE ng/mL

## 2010-12-18 LAB — HIV ANTIBODY (ROUTINE TESTING W REFLEX): HIV: NONREACTIVE

## 2010-12-18 LAB — ABO/RH: ABO/RH(D): A POS

## 2010-12-18 LAB — PREALBUMIN: Prealbumin: 8.6 mg/dL — ABNORMAL LOW (ref 18.0–45.0)

## 2010-12-18 LAB — TECHNOLOGIST SMEAR REVIEW: Path Review: INCREASED

## 2010-12-18 LAB — LACTATE DEHYDROGENASE: LDH: 152 U/L (ref 94–250)

## 2010-12-18 LAB — PATHOLOGIST SMEAR REVIEW

## 2010-12-18 LAB — VITAMIN B12: Vitamin B-12: >2000 pg/mL — ABNORMAL HIGH (ref 211–911)

## 2010-12-18 LAB — FERRITIN: Ferritin: 354 ng/mL — ABNORMAL HIGH (ref 22–322)

## 2011-01-20 NOTE — Discharge Summary (Signed)
NAMECRISTIN, SZATKOWSKI            ACCOUNT NO.:  [id number]   MEDICAL RECORD NO.:  [id number]          PATIENT TYPE:  INP   LOCATION:  6735                         FACILITY:  [facility]   PHYSICIAN:  Alvester Morin, M.D.  DATE OF BIRTH:  February 22, 1941   DATE OF ADMISSION:  11/26/2008  DATE OF DISCHARGE:  11/29/2008                               DISCHARGE SUMMARY   DISCHARGE DIAGNOSES:  1. Community-acquired pneumonia.  2. Anemia.  3. Type 1 diabetes.  4. Chronic obstructive pulmonary disease.  5. Malnutrition.  6. Hypothyroidism.  7. Anemia.   DISCHARGE MEDICATIONS:  With accurate doses:  1. Synthroid 50 mcg tablets 1 tablet by mouth once a day.  2. Vicodin 5/500 one tablet by mouth four times a day as needed for      leg pain.  3. Ritalin LA 40 mg/CP 24 one tablet by mouth four times a day      attempting to wean.  4. Hydrochlorothiazide 25 mg 1 tablet by mouth once a day.  5. Viagra 50 mg as needed.  6. Vitamin B12 1000 mcg 1 tablet once a day.  7. Iron sulfate 325 mg three times a day.  8. Avelox 400 mg 1 tablet daily for next 3 days.  9. The patient's is on Lantus insulin at the time of discharge.  The      patient is given 8 units of insulin Lantus in the morning and 9      units in the night.  The patient is being managed by Dr. Lucianne Muss,      endocrinologist, for his insulin needs.  The patient used to be on      insulin pump and he is at present on insulin pump holiday.   DISPOSITION AND FOLLOWUP:  The patient to follow up with Dr. Gaylyn Rong at  [facility] on December 20, 2008, at 11  a.m.  The patient to follow up with Dr. Sherryll Burger at [facility]  December 09, 2008, at 2 p.m.   PROCEDURES PERFORMED:  None.   CONSULTATIONS:  None.   BRIEF ADMITTING HISTORY AND PHYSICAL:  The patient is a 70 year old man  with poorly controlled type 1 diabetes, hypertension, hyperlipidemia,  anemia attributed to vitamin B12 and iron deficiency in the past,  chronic diarrhea, and chronic delayed sleep phase syndrome who presented  to [facility] ED on November 26, 2008, for generalized weakness and poor  appetite.  He was subsequently admitted by [facility] due  to some miscommunication regarding the patient's primary care Jordin Vicencio.  The patient was treated for community-acquired pneumonia.  The patient  had complained of 2-3 days of nonproductive cough, loss of appetite,  generalized weakness, and overall malaise without any fevers.  The  patient had reported mild chills, but no chest pain, no shortness of  breath.  It was discovered that during his hospitalization that  outpatient clinic was patient's primary care Shayan Bramhall.  The patient was  then transferred to the Teaching Service.  On further questioning, the  patient also reports 10 pounds of weight loss over the  last 1-2 months.  The patient stated that his diabetic has been difficult to control.  The  patient also complains of continued drowsiness through the day that he  had experienced for many years.   PHYSICAL EXAMINATION:  VITAL SIGNS:  On presentation temperature 98.2,  blood pressure 97/45, pulse of 87, respiratory rate of 23, 97% oxygen  saturation on room air.  GENERAL:  The patient is a disheveled and pale, but without any acute  distress.  HEENT:  Oral mucosa clear.  PERRLA.  EOMI.  NECK:  No lymphadenopathy.  No thyromegaly.  No carotid bruits.  RESPIRATORY:  Lungs are clear to auscultation.  Good air entry  bilaterally.  CARDIOVASCULAR:  Regular rate and rhythm.  No murmurs.  Normal heart  sounds.  ABDOMEN:  Soft and nontender.  No guarding or rigidity.  Bowel sounds  were hypoactive.  SKIN:  No wounds or rashes.  EXTREMITIES:  Without clubbing, cyanosis, or edema.  NEUROLOGIC:  The patient's cranial nerves are intact.  The patient is  alert and oriented x3.  Strength is normal in all extremities.  No  sensory loss is evident.  Reflexes are normal.  No  cerebellar signs.   LABORATORIES ON ADMISSION:  Sodium 137, potassium 4.5, chloride 103,  bicarb 26, BUN of 12, creatinine 0.89, calcium of 8.3, glucose of 193.  White blood cell count of 12.1, hemoglobin of 8.1, hematocrit of 23.5,  MCV of 94, platelet count of 182.  Occult blood in stool negative x1.  Prealbumin 8.6.  Urine screen positive for opiates only.  HIV  nonreactive.  Retic count of 1.7, retic percentage was 1.7, absolute  retic count of 51.3.  RBC 3.02.  Iron was 12, total iron-binding  capacity was 190.  Percent saturation was 6.  UIBC was 178.  Vitamin B12  was more than 2000.  Folate in the serum was. 14.5.  Ferritin was 354.  TSH was 2.5.  Cholesterol 122, triglycerides were 96, HDL was 25, LDL  was 78, VLDL was 19.  Chest x-ray had shown extensive aspirate  consolidation in the anterior and posterior segments of right upper  lobe.  Left lung was clear.  Heart size was normal.  No effusion.  His  findings were suggestive of right upper lobe pneumonia.  The patient had  a CT angiography of chest, which had shown right upper lobe infiltrate  with air bronchograms compatible with pneumonia.  No underlying mass or  adenopathy were identified.  No pulmonary nodule was identified.  There  was no profusion.  There was no pathological adenopathy in the  mediastinum.  It was negative for pulmonary embolism.  There is a mild  atherosclerotic calcification in the aortic arch.  There was no aortic  dissection or aneurysm.  There was a coronary artery calcification.   HOSPITAL COURSE BY PROBLEM:  1. Community-acquired pneumonia.  The patient clearly had prodrome and      clinical signs and symptoms of pneumonia and chest x-ray and CT      scan were confirmatory for community acquired pneumonia.  The      patient was treated with azithromycin and Rocephin for first 2 days      of admission.  The patient was then started on Avelox 400 mg once      daily.  The patient was advised to  continue taking the medication      for 3 days to complete the course of total 5 days of effective  antibiotic coverage.  The patient at the time of discharge had no      acute respiratory distress.  The patient had O2 saturation more      than 92% on room air.  The patient was fairly mobile without any      desaturation.  2. Type 1 diabetes.  The patient is managed by Dr. Lucianne Muss for his      insulin needs.  The patient used to be on the insulin pump that was      discontinued for short period of time.  According to the patient,      the patient is on pump holiday.  The patient received 9 units of      Lantus in the morning and 7 in the evening.  The patient had a      satisfactory control of hyperglycemia.  The patient was discharged      home with instruction to continue this dosage and he will be seen      by Dr. Lucianne Muss.  The patient plans to see Dr. Lucianne Muss within next 2-3      days  3. COPD.  The patient has a heavy smoking history.  The patient was      continued on bronchodilator during his hospitalization, but did not      show clinical signs of bronchospasm.  The patient was not      instructed to continue this therapy at home.  The patient in future      may require formal PFTs in assessment of COPD.  4. Hypothyroidism.  The patient's TSH was within normal range.  The      patient was continued on Synthroid 50 mcg per day.  5. Malnutrition.  The patient was thin and cachectic on appearance.      The patient has low prealbumin.  The patient was advised to improve      his nutrition.  On review of the clinical chart, it is evident that      this weight loss is subjective.  The patient's actual weight loss      is about 3 pounds, which could be just a normal variation and      secondary to acute sickness.  6. Anemia.  The patient has anemia of unclear etiology.  The patient      in the past has been evaluated by outpatient clinic on multiple      occasions.  The patient was on  separate occasions found to be      deficient in iron and vitamin B12.  The patient has been      subsequently repleted.  The patient's hemoglobin improved and      remained between 10-12 mg/dl.  The patient on this admission has      reached lowest nadir of his hemoglobin values.  The patient's      hemoglobin was at 8 at present.  Anemia panel shows pattern more      consistent with anemia of chronic disease.  However, the patient      does not have any other chronic disease that has been diagnosed      except diabetes.  Review of the outpatient clinic chart revealed      that the patient had received Procrit about 5 years ago.  The      patient reports that he had significant improvement in his daily      activities and energy level during that period.  The  patient      requested further workup for the anemia.  We believe the patient      may benefit from bone marrow biopsy to further assess management of      anemia.  The patient would see Dr. Gaylyn Rong for further assessment.  7. Primary hypersomnolence disorder.  The patient has been evaluated      by Dr. Jetty Duhamel and Dr. Vickey Huger in the past with the sleep      studies.  The patient had a last sleep study in August 2009, which      showed chronic delayed sleep phase syndrome.  The patient was      advised on changing of the sleeping behavior.   At the time of discharge, the patient's vitals were as following:  Temperature 97.5, blood pressure of 112/76, respiratory rate of 20,  pulse of 81, and 97% O2 saturation on room air.      Clerance Lav, MD PhD  Electronically Signed      Alvester Morin, M.D.  Electronically Signed    RS/MEDQ  D:  11/30/2008  T:  12/01/2008  Job:  [id number]   cc:   Jethro Bolus, MD

## 2011-01-20 NOTE — Assessment & Plan Note (Signed)
[facility]                             PULMONARY OFFICE NOTE   NAME:Benjamin Hodge, Benjamin FAUCETT                   MRN:          [id number]  DATE:01/06/2007                            DOB:          Apr 12, 1941    PROBLEM:  This is a 70 year old man referred by his family for a sleep  medicine evaluation with complaints of waking after sleep onset and the  need for what seems like a lot of sleep.   HISTORY:  Back to early grade school, he was noted to be hyperactive and  restless. He carries a diagnosis of attention deficit hyperactivity  disorder managed with Ritalin 40 mg LA daily. He has been given a  diagnosis of primary hypersomnia and describes his sleep pattern as  bedtime between 11pm and 12:30am. He wakes every night at some time  during the night, usually after an hour or so of sleep. He will then not  sleep for several hours before finally getting back to sleep. No matter  when he gets back to sleep or when he has to get up in the morning, he  says that he will feel a strong urge to return to bed and does not begin  to feel he has had enough sleep until he has slept about 12 hours total  by the clock. He still tends to feel that he is not refreshed. He  finally gets of bed in the morning sometime between 11am and 1pm. He had  a sleep study at [facility] and had then been evaluated  by Dr. Vickey Huger with understanding that he had little or no sleep  disordered breathing. He tried Rozeram and Seroquel without benefit for  sleep quality. He failed trials of Prozac and Wellbutrin. Provigil had  no effect at all. Contact had been made to try Xyrem, but he has not  accepted delivery yet and does not know the details of dosing. He wanted  to see me first.   MEDICATIONS:  1. Lantus 100 units.  2. NovoLog.  3. Lisinopril 5 mg.  4. Levothyroxine 25 mcg.  5. Ritalin LA 40 mg daily.  6. Amitriptyline 25 mg.  7. Hydrochlorothiazide 25 mg.  8. Vicodin p.r.n.   ALLERGIES:  Sensitive to BEE STINGS, but no medication allergy.   REVIEW OF SYSTEMS:  He wakes up not refreshed, otherwise as per the  history of present illness. Slight weight gain of about 10 pounds in the  last few years.   PAST HISTORY:  Diabetes, insulin dependent, variable blood pressure,  chronic obstructive pulmonary disease, attention deficit disorder,  hypothyroid, primary hypersomnia.   SOCIAL HISTORY:  He quit smoking in October 2005. He quit alcohol. He is  divorced.   FAMILY HISTORY:  Cancer. Nobody with sleep disorders.   OBJECTIVE:  Weight 139 pounds, blood pressure 166/84, pulse regular 94,  room air saturation 99%. This is an alert, intelligent, and trim man.  Brisk mannerisms, no tremor, palms are dry. Heart sounds are regular  without murmur or gallop. Breathing is unlabored. Neurologic is  unremarkable to observation, but he certainly does not  look sleepy.   IMPRESSION:  1. Complaint of waking after sleep onset.  2. Complaint of need for 12 hours of sleep a day as being too much.  3. Attention deficit disorder.  4. Former heavy smoker.  5. Diabetes.   PLAN:  We are going to start Xyrem 0.5 mg per 15 ml of solution giving  divided doses nightly and repeat 2 1/2 hours later for a total of 6  grams per night. We will see how the initial prescription was written  and build from that as appropriate. He is going to contact the Xyrem  people to switch the prescribing physician from Dr. Vickey Huger to me at  his choice and suggestion. Schedule return here 1 month, earlier p.r.n.     Clinton D. Maple Hudson, MD, Tonny Bollman, FACP  Electronically Signed    CDY/MedQ  DD: 01/06/2007  DT: 01/07/2007  Job #: [id number]   cc:   [facility]

## 2011-01-20 NOTE — H&P (Signed)
Benjamin Hodge, Benjamin Hodge            ACCOUNT NO.:  [id number]   MEDICAL RECORD NO.:  [id number]          PATIENT TYPE:  INP   LOCATION:  6735                         FACILITY:  [facility]   PHYSICIAN:  Michiel Cowboy, MDDATE OF BIRTH:  12-10-1940   DATE OF ADMISSION:  11/26/2008  DATE OF DISCHARGE:                              HISTORY & PHYSICAL   PRIMARY CARE Julianna Vanwagner:  Reather Littler, M.D.   CHIEF COMPLAINT:  Generalized weakness, not feeling well, poor appetite.  The patient is a 70 year old gentleman with history of type 1 diabetes,  ADHD syndrome, chronic leg pain and hypertension.  The patient reports  that for the past 2-3 weeks he had been having nonproductive cough, loss  of appetite, generalized weakness and overall malaise and not feeling  well, although no fevers.  He occasionally had mild chills.  No chest  pain.  No shortness of breath.  He had lost at least 8 pounds or perhaps  more.  He says that his blood sugars have been very irratic , going all  over the place between 600 to less than 50s.  He reports that he has  hardly eaten anything because many  foods make him nauseous.  Otherwise  review of systems unremarkable, as he has had no fever, no chest pain.  No vomiting, no diarrhea. In the ambulance patient stated that he felt  so bad he wanted to jump off a building. Later he insisted that was a  figure of speech and in no way was he thinking of hurting himself or  others.   PAST MEDICAL HISTORY:  1. Significant for type 1 diabetes followed by Dr. Lucianne Muss, currently      off of insulin pump.  The patient has been doing a lot of self-      management.  2. GERD.  3. Hypertension.  4. Peptic ulcer disease.  5. Insomnia/hypersomnia.  6  Attention deficit disorder.   ALLERGIES:  No known drug allergies.   MEDICATIONS:  1. Citrucel fiber daily.  2. Hydrochlorothiazide 25 mg daily.  3. NovoLog sliding scale.  4. Ritalin 20 mg t.i.d., but the patient sometimes takes  less of this.  5. Synthroid, he thinks at 25 mcg daily.  6. Vicodin as needed for pain.  7. Lantus, he takes 9 units at midnight and 7 units at noon.  8. Over the counter B12 for vitamin.  9. He used to be taking Procrit years ago but has since stopped..   SOCIAL HISTORY:  The patient used to be a heavy smoker, smoked for at  least 50 years.  Alcohol:  Used to be a heavy drinker but reports no  alcohol use for the past 5-6 years.   FAMILY HISTORY:  has many family members with lung cancer   VITALS:  Temperature 98.2, blood pressure 97/45, pulse 87, respirations  23, saturation  97% on room air.  Currently he remains afebrile, blood  pressure 109/72, saturation 97% on room air.  The patient is somewhat  orthostatic.   PHYSICAL EXAM:  The patient is a disheveled, thin male with somewhat dry  mucous membranes  and decreased skin turgor.  SKIN:  Clean, dry and intact.  LUNGS:  Somewhat diminished breath sounds on the right.  HEART:  Regular rate and rhythm.  No murmurs appreciated.  ABDOMEN:  Slight periumbilical and epigastric tenderness but no rebound,  no guarding noted.  LOWER EXTREMITIES: Without clubbing, cyanosis or edema, appeared to be  very thin though.  NEUROLOGIC:  Patient intact.  Strength 5/5 in all 4 extremities.  Cranial nerves intact.   LABORATORIES:  White blood cell count 16.5, hemoglobin 9.7.  Sodium 135,  potassium 5.2, creatinine 1.1, bicarb 30, BUN 29, blood sugar of 378 and  was up to 455, at which point he received 10 units of Lantus and it came  down to 376.   LFTs within normal limits.  UA remarkable.  Chest x-ray showed right  upper lobe wedge-like infiltrate worrisome for right upper lobe  pneumonia.   ASSESSMENT AND PLAN:  1. Pneumonia:  The patient has high risk factors for cancer, cannot      rule out postobstructive pneumonia, and given weight loss, history      of tobacco abuse in the past, will evaluate with CT scan of the      chest.  Right  now will cover with Rocephin and azithromycin.  If      postobstructive pneumonia is noted, change in antibiotics would be      warranted.  For right now will give guaifenesin, follow.  Currently      patient afebrile, will avoid blood cultures for right now, the      patient is not producing any sputum.  2. Dehydration likely secondary to poor p.o. intake:  Will give IV      fluids and follow.  3. Anemia:  Will check anemia panel, Hemoccult stool.  4. Brittle diabetes type 1:  Would recommend discussion with Dr. Lucianne Muss      about what does he think is the best regimen for this patient.  Per      patient, he had not quite cleared his current regimen with Dr.      Lucianne Muss and I am not 100% sure that that is what should be working      the best for him.  The patient is very adamant that the current      regimen helps him the best.  Will need to have help from his      endocrinologist in trying to make adjustments.  For now, will      continue Lantus sliding scale insulin and watch blood sugars to      avoid hypoglycemia while the patient has decreased p.o. intake.  5. History of gastroesophageal reflux disease:  Also will make sure he      is on Protonix.  6. Prophylaxis:  Protonix plus SCD until sure that the patient is      Hemoccult negative.      Michiel Cowboy, MD  Electronically Signed     AVD/MEDQ  D:  11/26/2008  T:  11/26/2008  Job:  [id number]   cc:   Reather Littler, M.D.

## 2011-01-23 NOTE — Discharge Summary (Signed)
NAME:  Benjamin Hodge, Benjamin Hodge                      ACCOUNT NO.:  [id number]   MEDICAL RECORD NO.:  [id number]                   PATIENT TYPE:  INP   LOCATION:  6707                                 FACILITY:  [facility]   PHYSICIAN:  Ralene Ok, M.D.                   DATE OF BIRTH:  12-24-1940   DATE OF ADMISSION:  DATE OF DISCHARGE:  03/08/2004                                 DISCHARGE SUMMARY   DISCHARGE DIAGNOSES:  1. Diabetic ketoacidosis, resolved.  2. Type 2 diabetes mellitus.  3. Pyrexia and leukocytosis, resolved.  4. Preliminary blood cultures negative.  5. Urine negative.   BRIEF CLINICAL HISTORY/ HOSPITAL COURSE:  This 70 year old white male was  admitted after he developed nausea and vomiting the previous day, and then  was brought to the emergency room and found to be in mild diabetic  ketoacidosis with blood sugars of more than 450.  His anion gap was also  elevated and bicarbonate level slightly decreased at 18.  The patient was  treated with IV fluids and normal saline, and IV insulin drip.  Interestingly, the patient did not require any potassium.  His initial BUN  was elevated although creatinine levels were at the upper limits of normal.  At the time of discharge, his urine is normal.  He also had leukocytosis and  low-grade pyrexia.  However, there were no signs of infection, and  urinalysis was negative.  Chest is clear and chest x-ray unfortunately is  still pending. The serum ketones/ beta hydrobutyrate was requested but not  done.   The patient was empirically started on Avelox.  However, he did not receive  this medication until 2 a.m.  His CBC today is 9, down from 17, and thought  to be probably secondary to his ketoacidosis causing leukocytosis rather  than infection.  He is also apyretic today as mentioned above.   His other laboratory investigations included an EKG that shows normal sinus  rhythm with nonspecific ST changes.  At the time of discharge, the  patient  is alert, oriented x3.  He has no further nausea and vomiting, no cough.  His appetite is back.   PLAN:  1. The patient will be discharged home back on his previous dose of Lantus     of 25 units, and continue with his sliding scale of NovoLog insulin.  2. The patient was placed on a NicoDerm patch; however, his compliance with     nonsmoking is going to be rather poor, and this will not be continued in     the outpatient setting.  3. The patient is advised to follow up in the office in five days time.  Ralene Ok, M.D.    RM/MEDQ  D:  03/08/2004  T:  03/09/2004  Job:  [id number]

## 2011-01-23 NOTE — H&P (Signed)
NAME:  Benjamin Hodge, Benjamin Hodge            ACCOUNT NO.:  [id number]   MEDICAL RECORD NO.:  [id number]          PATIENT TYPE:  INP   LOCATION:  6531                         FACILITY:  [facility]   PHYSICIAN:  Jonna L. Robb Matar, M.D.DATE OF BIRTH:  01-Sep-1941   DATE OF ADMISSION:  06/12/2004  DATE OF DISCHARGE:                                HISTORY & PHYSICAL   PRIMARY CARE PHYSICIAN:  Alan Mulder, M.D.   CHIEF COMPLAINT:  Vomiting black emesis for a couple of days.   HISTORY OF PRESENT ILLNESS:  This 70 year old white male diabetic acutely  was seen 4 days ago in the emergency room with fluctuating sugars.  He was  treated with Insulin and sent home.  Over the last 2 or 3 days he has been  vomiting and has been having coffee ground emesis. No melanoa or  hematemesis.  The patient however has been fatigued now for several months  and just has not felt good about no specific complaints other than some  chronic coughing, but he denies sinus pressure, diarrhea or constipation,  skin infections, kidney infections.  Because he has been having some  vomiting he has been taking some Alka-Seltzer and actually had been taking  some low-dose aspirin until a few days ago when he was told by the  physician's assistant to increase it to 325 mg daily.  In the emergency room  he has received Phenergan, Zofran, and some regular Insulin and fluids.   PAST MEDICAL HISTORY:  About 3 or 4 years ago he was admitted to the  hospital and was told then that he had a high white count and they were not  sure what the source was.  He was treated with some antibiotics and he is  pretty sure it got better, then he went home.   ALLERGIES:  None.   OPERATIONS:  Penile implant; tonsillectomy.   MEDICATIONS:  1.  Lantus 23 units at h.s.  2.  Humalog sliding scale and takes 4 units for every 100 points over 100.  3.  He is not on any other prescribed medications.   REVIEW OF SYSTEMS:  The patient has had some  weight loss, no fever,  definitely anorexic.  Occasional blurred vision.  No sore throat. He does  have an occasional cough that is slightly productive but no asthma, no  history of chest pain.  He is positive for claudication.  No hematuria, has  had a penile implant for erectile dysfunction.  No thyroid or B12 problems.  He cannot recall if he has been told he was anemic before.  No skin rashes  or infections.  No arthritic complaints.  No history of seizures.  Does have  some problems with sleeping (has not slept in the last couple of days  because he has been so ill).  He has been experiencing reflux symptoms.   SOCIAL HISTORY:  Smokes 3 packs per day. No alcohol. No drugs.  Works  delivering oxygen tanks. Divorced with 1 son, 3 grandsons.  His sister came  in with him and also gave some of the history.   PHYSICAL  EXAMINATION:  VITAL SIGNS:  Temperature 97, pulse 96, respirations  24, blood pressure 109/58. Saturating 100%.  GENERAL:  He is a well-developed, thin, white male who looks chronically  ill.  HEENT:  Conjunctivae and lids are normal.  Pupils reactive.  EOMs are full.  He has dry mucosa, slightly reddened pharynx.  NECK:  No masses, thyromegaly.  There might be a faint carotid bruit.  LUNGS:  Respiratory effort was normal.  The lungs are clear to A&P without  wheezing, rales, rhonchi or dullness.  HEART:  Slightly tachycardic, normal S1, S2 without murmurs, rubs, or  gallops.  EXTREMITIES:  No clubbing, cyanosis or edema.  ABDOMEN:  Slightly tender in the epigastric area, normal bowel sounds.  No  hepatosplenomegaly or hernias.  He has bilateral femoral bruits.  GU:  Scrotum was normal, he has a penile implant.  LYMPH:  There is no cervical or inguinal adenopathy.  NEURO:  Muscle strength is 5/5 with full range of motion in all 4  extremities. Cranial nerves are intact.  DTRs are 2+ and equal.  Sensation  seems to be intact in the feet.  He is alert and oriented x3.   Normal  memory, judgment and affect.  SKIN:  No skin rashes, lesions or nodules.   LABORATORY DATA:  EKG is unremarkable. Glucose 544.  BUN 53, creatinine 1.8.  He has some mild ketosis on a urinalysis but no pyuria. White count was 17.3  thousand, hemoglobin 9.0.   IMPRESSION:  1.  Uncontrolled diabetes. I am going to put him on frequent sliding scale      Insulin, check his hemoglobin A1C.   1.  Leukocytosis.  It is not very clear where the infection is coming from      and if it does not clear with Rocephin will consider getting a bone      marrow biopsy on him to rule out a chronic leukemia.  In the meantime      will check a chest x-ray at CT of the sinuses looking for a source and      put him on some Rocephin.   1.  Claudication and bilateral femoral bruits, probably has peripheral      vascular disease.  Will check some arterial studies.  He may need a      vascular consult.   1.  Dehydration versus chronic renal failure. This is probably some of both.   1.  Upper gastrointestinal bleed with anemia. Will do a work up and since he      is having orthostatic symptoms I am going to transfuse him at least 1      unit.  In a couple of days when his diabetes has improved      gastroenterology can be consulted for an esophagogastroduodenoscopy.   1.  Prophylaxis:  Will put him on a patch for his smoking.  Might consider      checking his lipids and consider ARB for diabetic prophylaxis.      JLB/MEDQ  D:  06/12/2004  T:  06/13/2004  Job:  [id number]   cc:   Alan Mulder, M.D.  [address]  [place]  [place] [address]  Fax: [phone]

## 2011-01-23 NOTE — H&P (Signed)
NAME:  Benjamin Hodge, Benjamin Hodge                      ACCOUNT NO.:  [id number]   MEDICAL RECORD NO.:  [id number]                   PATIENT TYPE:  INP   LOCATION:  2112                                 FACILITY:  [facility]   PHYSICIAN:  Ralene Ok, M.D.                   DATE OF BIRTH:  11-09-1940   DATE OF ADMISSION:  03/06/2004  DATE OF DISCHARGE:                                HISTORY & PHYSICAL   DIAGNOSIS ON ADMISSION:  Diabetic ketoacidosis.   HISTORY OF PRESENT COMPLAINT:  This 70 year old white male who is a known  type 1 diabetic with peripheral neuropathy was admitted to the hospital with  nausea and vomiting since 12 noon the previous day.  The patient said he did  take 15 units of Lantus yesterday but has been eating very little and did  not follow up with his blood sugars earlier today.   The patient on arrival to the emergency room was found to be rather drowsy.  His blood sugars were grossly elevated at 459.  His bicarb was decreased at  CO2 and anion gap was elevated.  Urine showed large amount of ketones.  The  patient is a heavy smoker and does not drink any alcohol.  He also has a  diagnosis of adult attention deficit disorder but has been off treatment for  this.   There is no past history of coronary artery disease or hypertension, and his  renal function has been normal in the past.   PHYSICAL EXAMINATION:  GENERAL:  The patient looked unwell.  He is very  drowsy and answers only few questions.  VITAL SIGNS:  His heart rate was 100 per minute and regular, blood pressure  was 130/20.  NECK:  Supple.  HEENT:  Pupils were equal and reactive.  EXTREMITIES:  He was moving all four limbs.  NEUROLOGIC:  Reflexes were equal and plantars flexor.  Gait was not tested.  CHEST:  His chest was clinically clear to auscultation.  ABDOMEN:  Soft, bowel sounds were heard normally.  There was no tenderness  or guarding.   IMPRESSION:  Diabetic ketoacidosis, mild.   The patient will  be admitted to the ICU for IV fluids and IV insulin.  Will  check amylase and lipase and serum ketones or beta-hydroxybutyrate.  Will  place the patient on Phenergan and Protonix and place him on clear liquids.                                                Ralene Ok, M.D.    RM/MEDQ  D:  03/07/2004  T:  03/07/2004  Job:  [id number]

## 2011-01-23 NOTE — Discharge Summary (Signed)
NAMESHONTE, SODERLUND            ACCOUNT NO.:  [id number]   MEDICAL RECORD NO.:  [id number]          PATIENT TYPE:  INP   LOCATION:  9811                         FACILITY:  [facility]   PHYSICIAN:  Alvester Morin, M.D.  DATE OF BIRTH:  [birth year]   DATE OF ADMISSION:  05/22/2005  DATE OF DISCHARGE:  05/28/2005                                 DISCHARGE SUMMARY   DISCHARGE DIAGNOSES:  1.  Right foot cellulitis.  2.  Diabetes mellitus type 2, poorly controlled with a hemoglobin A1c of      9.3.  3.  Corneal abrasion.  4.  Iron deficiency anemia with positive fecal occult blood.  5.  Sub-clinical hypothyroidism.  6.  Systolic murmur.  7.  ADHD.  8.  Depressive disorder.  9.  Spinal stenosis.   DISCHARGE MEDICATIONS:  1.  Lexapro 10 mg p.o. daily.  2.  Ritalin 40 mg p.o. b.i.d.  3.  Vicodin 5/500 mg, one to two tab p.o. q.6h. p.r.n.  4.  Lantus 20 units subcutaneous q.h.s.  5.  NovoLog sliding scale insulin t.i.d., a.c.  6.  Viagra 50 mg p.r.n.  7.  Lisinopril 2.5 mg p.o. daily.  8.  Synthroid 0.025 mg p.o. daily.  9.  Ferrous sulfate 325 mg p.o. b.i.d.  10. Doxicycline 100 mg b.i.d. for 10 days.   DISPOSITION:  The patient was discharged home on a 10-day course of  doxycycline for a right foot cellulitis, and will follow up with Dr.  Manning Charity in the outpatient clinic on June 26, 2005.  At that time  his diabetes management, as well as the results of the colonoscopy should be  addressed, and a CBC checked.  A TSH and free T4 levels should be checked in  early November 2006.   PROCEDURE:  1.  MRI of the right foot showed possible osteomyelitis, versus erosion and      possible fasciitis.  2.  A 2-D echocardiogram showed an ejection fraction of 55%-65% and a mitral      valve area was estimated by pressure half time to be 2.89 sq/cm.  A TEE      was suggested if endocarditis was suspected.  3.  The TEE showed no evidence of endocarditis.   CARDIAC ASSESSMENT:  1.   Dr. Ozzie Hoyle. Bevis, ophthalmologist.  2.  Dr. Vanita Panda. Magnus Ivan, orthopedics.   HISTORY OF PRESENT ILLNESS:  Mr. Wise is a 70 year old white man who  presented with pain, swelling and redness in his right foot.  Three days  prior to admission he had stepped on broken glass; however, he did not feel  it.  Two days prior to admission he started having some pain in his right  foot, as well as swelling and redness.  On the day prior to admission he had  a subjective fever feeling and chills.  He presented to the clinic on the  day of admission with these symptoms, as well as the fact that his dog had  scratched him in his right eye.   REVIEW OF SYSTEMS:  He was also complaining of a few years  of fatigue, which  was worse in the past four to five months.  Some diaphoresis and night  sweats.  Chronic cough for two to four months.  Some anorexia.  Easy  bruising for four to five months, as well as some weakness for the same  amount of time.   PHYSICAL EXAMINATION:  VITAL SIGNS:  Temperature 99.3 degrees, blood  pressure 133/61, pulse 92, respirations 20.  Saturation of oxygen 91% on  room air.  GENERAL:  He was restless and very talkative.  HEENT:  His right eye was kept closed and was deeply red.  Mouth exam was  normal.  NECK:  He did not have carotid bruits; however, he had multiple adenopathies  of 1 to 2 cm sub-mandibular on the left and approximately 1 cm on the right.  LUNGS:  Clear to auscultation bilaterally without wheezing.  HEART:  A regular rate and rhythm with a 2/6 systolic murmur heard best at  the base.  ABDOMEN:  Soft, with positive bowel sounds.  EXTREMITIES:  On his right foot he had a 3 cm long cut under his right foot  with swelling and redness all around this as well as warmth.  He had long  unkempt toenails.  SKIN:  He had some petechiae and purpura, mostly on the left arm and  bilateral legs, without any specific pattern.  NODES:  He did not have  inguinal adenopathy.  NEUROLOGIC:  Normal examination.   LABORATORY DATA:  White blood cells 16.4, ANC 13.7, hemoglobin 9.2,  platelets 297.  Liver panel was completely normal.  Urinalysis was normal.   HOSPITAL COURSE:  #1 - RIGHT FOOT CELLULITIS:  He was started on vancomycin  and Zosyn for a right foot cellulitis.  An x-ray and an MRI were later  obtained, and the MRI was questionable for osteomyelitis.  Dr. Magnus Ivan was  consulted and did not feel that this was osteomyelitis.  He then suggested  to continue elevation of the leg with IV antibiotics.  He had a total of  seven days of IV antibiotics in the hospital, and with improvement of  the  cellulitis was discharged home with a 10-day course of doxycycline.  He will  follow up with his primary care physician in one month.   #2 - ANEMIA:  The workup for his anemia included a decreased iron level at  12, total iron binding capacity decreased at 201, percent saturation  decreased to 6, ferritin increased to 678, reticulocyte count absolute 37.2,  haptoglobin 314.  Normal LDH, folate and B12.  The protein electrophoresis  showed 3 Lambda light chains, increased at 2.09.  He later tested heme-  positive in his stool.  His hemoglobin was stable so he was given a  prescription for iron supplements and I arranged for an outpatient  colonoscopy.  The GI Office will call him with the appointment and details.   #3 - DIABETES MELLITUS TYPE 2:  His hemoglobin A1c was 9.3.  We are  discharging him home with an increased dose of Lantus from his usual 18  units to now 20 units, along with sliding scale insulin, which he knows how  to use.   #4- FATIGUE:  This was felt to be multifactorial including anemia and poorly-  controlled diabetes mellitus, as well as the possibility of hypothyroidism.  A TSH was found to be 4.4 with a free T4 level of 0.78.  We started him on low-dose Synthroid of 0.025 mg daily.  The  TSH and T4 levels should be   rechecked in early November 2006.  Given the lymphadenopathy that was found,  we suggested that he be tested for HIV, but he refused, stating that he does  not have risk factors.  A coagulation panel was also checked, given his  history of easy bruising and the petechiae and purpura found on physical  examination.  The PT was 13.2 and INR was 1.0.  We did not go any further  with this.  If the problem persists, this can be investigated more  intensively as an outpatient.   #5 - CORNEAL ABRASION:  The patient was seen by Dr. Ozzie Hoyle. Bevis who  suggested moxifloxacin drops qid during hospitalization.  He was discharged  with the recommendations to do cold compressive and to see Dr. Vonna Kotyk as  needed.   DISCHARGE LABORATORY/VITAL SIGNS:  Temperature 98.8 degrees, blood pressure  165/73, pulse 86, respirations 20.   CBC's ranging from 132 to 428.  Saturation of oxygen 96% on room air.  White  blood count 9.1 with ANC of 6.1, hemoglobin 9.2, platelets 422.  Sodium 141,  potassium 3.2, chloride 106, CO2 of 28, BUN 8, creatinine 0.9, glucose 84,  calcium 8.7.      Dellia Beckwith, M.D.      Alvester Morin, M.D.  Electronically Signed    VD/MEDQ  D:  06/24/2005  T:  06/24/2005  Job:  [id number]   cc:   Ozzie Hoyle. Vonna Kotyk, MD  [address]  [place], [place] [address]   Vanita Panda. Magnus Ivan, M.D.  Fax: [phone]

## 2011-01-23 NOTE — Discharge Summary (Signed)
Benjamin Hodge, Benjamin Hodge            ACCOUNT NO.:  [id number]   MEDICAL RECORD NO.:  [id number]          PATIENT TYPE:  INP   LOCATION:  3741                         FACILITY:  [facility]   PHYSICIAN:  Lonia Blood, M.D.      DATE OF BIRTH:  May 11, 1941   DATE OF ADMISSION:  06/12/2004  DATE OF DISCHARGE:  06/16/2004                                 DISCHARGE SUMMARY   PRIMARY CARE PHYSICIAN:  Alan Mulder, M.D. in [place].   DISCHARGE DIAGNOSES:  1.  Diabetes mellitus, uncontrolled.  2.  Leukocytosis.  3.  Acute renal insufficiency, resolved.  4.  Questionable gastrointestinal bleed.  5.  Claudication.  6.  Hypokalemia.   DISCHARGE MEDICATIONS:  1.  Nicotine patch.  2.  Lantus insulin 24 units q.h.s.  3.  Humalog insulin sliding scale.  4.  Zyban 150 mg daily with two days of 100 mg b.i.d.  5.  Lisinopril 10 mg daily.   PROCEDURE PERFORMED:  1.  Femoral lower extremity arterial evaluation secondary to bilateral      femoral bruits.  They were normal.  There was moderate calcific plaques      only on the right.  2.  Carotid Dopplers.  There was a 60-80% internal carotid artery stenosis,      low range scale with increased velocities.  No significant internal      carotid artery stenosis on the left.   BRIEF HISTORY AND PHYSICAL:  Benjamin Hodge was admitted with a history of  vomiting black emesis.  The patient was found however, on admission to have  a CBG of 544, BUN 5.3 and creatinine 1.8.  The patient seemed to be very dry  and also complained of some claudication of his lower extremities.  He was  previously diagnosed as diabetes but has not been following with his  primary care physician.  His white count on admission was 17.3, and  hemoglobin was 9.0.  He was subsequently admitted for presumed GI bleed.  He  was not guaiac positive on admission.   HOSPITAL COURSE:  1.  Diabetes.  The patient's diabetes was extremely uncontrolled.  His      hemoglobin A1c was 10.0.   He was also found to be noncompliant with his      medication.  He was supposed to be on Lantus at home but he has not been      taking it, he has not been happy with his primary care physician, he      said.  Subsequently, the patient was started on a regimen of Lantus      insulin 24 units q.h.s. which seemed to have controlled his CBG somewhat      although his CBG has been going gradually up after meals and was      subsequently covered with sliding scale insulin.  He is being discharged      to follow up with a new physician who will further titrate his insulin.  2.  Leukocytosis.  The patient has increased white count of 17.3 which was      as high as  20,000.  Work up was performed including blood cultures,      urinalysis, chest x-ray but no cause was essentially found.  He was      empirically started on some Rocephin because of a history of heavy      tobacco use although his chest x-ray did not indicate any pneumonia.      The patient's white count subsequently improved and by the time of      discharge, it is essentially in the normal range, down to 8.5.  3.  Acute renal insufficiency.  On admission, this patient's creatinine was      1.8 and he seems to be very dry.  The patient was given some fluid      boluses and his creatinine immediately improved down to 0.7, showing      that his renal insufficiency was essentially secondary to dehydration.      He was subsequently did not have any other work up for the renal      insufficiency.  Following rehydration, the patient's potassium dropped.      He did have a lot of emesis prior to coming into the hospital.  This may      be related to some drug intake although his urine drug screen was      essentially negative including alcohol.  Subsequently, his magnesium      level was checked and it was 1.5, subsequently it was 2.3.  He was      therefore, the potassium was therefore replaced and he was discharged      with normal  potassium.  4.  Tobacco abuse.  The patient also had tobacco abuse however, he has      indicated his willingness to quit and on discharge, he is being sent      home on Zyban as well as nicotine patch with full counseling given.      Questionable coffee ground emesis.  The patient mentioned that he did      have dark brown coffee ground emesis on admission however, he was guaiac-      negative by examination.  In addition, the patient stated he took some      sort of __________ coke prior to his vomiting which may have turned his      vomitus coffee ground.  Although he is anemic on admission, which      further confounded the whole issue.  GI was not involved in his      evaluation but he was placed on Protonix while in the hospital.  He      subsequently did not show any sign of GI bleed.  5.  Anemia.  The patient's anemia was essentially worked up with, which      seems to be more like iron deficiency.  He has never had any colonoscopy      and he definitely will need to have one, especially with his age being      64.  He was guaiac-negative in the hospital.  There may be anemia of      chronic disease as well.  He was given two units of packed red blood and      his hemoglobin was 12.5 at time of discharge.       LG/MEDQ  D:  06/16/2004  T:  06/16/2004  Job:  [id number]   cc:   Alan Mulder, M.D.  [address]  [place]  [place] [address]  Fax: [phone]

## 2011-04-21 ENCOUNTER — Other Ambulatory Visit: Payer: Self-pay | Admitting: Internal Medicine

## 2011-04-22 MED ORDER — DIPHENOXYLATE-ATROPINE 2.5-0.025 MG PO TABS: 1.0000 | ORAL_TABLET | Freq: Two times a day (BID) | ORAL | Status: DC | PRN

## 2011-04-22 NOTE — Telephone Encounter (Signed)
Rx. Printed and faxed to pharmacy for Lomotil.

## 2011-04-22 NOTE — Telephone Encounter (Signed)
Can patient have refill of his Lomotil?  Please advise-Thanks.

## 2011-04-22 NOTE — Telephone Encounter (Signed)
Yes.  Thank you.

## 2011-06-09 LAB — GLUCOSE, CAPILLARY: Glucose-Capillary: 312 — ABNORMAL HIGH

## 2011-07-22 ENCOUNTER — Encounter: Payer: Self-pay | Admitting: *Deleted

## 2011-07-27 ENCOUNTER — Other Ambulatory Visit: Payer: Self-pay | Admitting: Oncology

## 2011-07-27 ENCOUNTER — Ambulatory Visit (HOSPITAL_BASED_OUTPATIENT_CLINIC_OR_DEPARTMENT_OTHER): Payer: Medicare Other | Admitting: Oncology

## 2011-07-27 ENCOUNTER — Telehealth: Payer: Self-pay | Admitting: Oncology

## 2011-07-27 ENCOUNTER — Other Ambulatory Visit (HOSPITAL_BASED_OUTPATIENT_CLINIC_OR_DEPARTMENT_OTHER): Payer: Medicare Other | Admitting: Lab

## 2011-07-27 VITALS — BP 110/56 | HR 60 | Temp 97.8°F | Ht 66.0 in | Wt 132.6 lb

## 2011-07-27 DIAGNOSIS — R5383 Other fatigue: Secondary | ICD-10-CM

## 2011-07-27 DIAGNOSIS — D649 Anemia, unspecified: Secondary | ICD-10-CM

## 2011-07-27 DIAGNOSIS — Z23 Encounter for immunization: Secondary | ICD-10-CM

## 2011-07-27 LAB — COMPREHENSIVE METABOLIC PANEL
ALT: 17 U/L (ref 0–53)
AST: 18 U/L (ref 0–37)
Albumin: 4.2 g/dL (ref 3.5–5.2)
BUN: 36 mg/dL — ABNORMAL HIGH (ref 6–23)
CO2: 28 mEq/L (ref 19–32)
Calcium: 9.4 mg/dL (ref 8.4–10.5)
Chloride: 105 mEq/L (ref 96–112)
Creatinine, Ser: 1.33 mg/dL (ref 0.50–1.35)
Potassium: 5 mEq/L (ref 3.5–5.3)

## 2011-07-27 LAB — CBC WITH DIFFERENTIAL/PLATELET
Basophils Absolute: 0 10*3/uL (ref 0.0–0.1)
EOS%: 2.6 % (ref 0.0–7.0)
Eosinophils Absolute: 0.2 10*3/uL (ref 0.0–0.5)
HCT: 28.4 % — ABNORMAL LOW (ref 38.4–49.9)
HGB: 9.5 g/dL — ABNORMAL LOW (ref 13.0–17.1)
MCH: 33.8 pg — ABNORMAL HIGH (ref 27.2–33.4)
NEUT%: 75.6 % — ABNORMAL HIGH (ref 39.0–75.0)
lymph#: 1.1 10*3/uL (ref 0.9–3.3)

## 2011-07-27 LAB — IRON AND TIBC
%SAT: 29 % (ref 20–55)
Iron: 87 ug/dL (ref 42–165)
TIBC: 296 ug/dL (ref 215–435)
UIBC: 209 ug/dL (ref 125–400)

## 2011-07-27 LAB — TESTOSTERONE: Testosterone: 238.76 ng/dL — ABNORMAL LOW (ref 250–890)

## 2011-07-27 LAB — VITAMIN B12: Vitamin B-12: 470 pg/mL (ref 211–911)

## 2011-07-27 MED ORDER — INFLUENZA VIRUS VACC SPLIT PF IM SUSP
0.5000 mL | INTRAMUSCULAR | Status: AC | PRN
  Administered 2011-07-27: 0.5 mL via INTRAMUSCULAR

## 2011-07-27 NOTE — Progress Notes (Signed)
[facility] OFFICE PROGRESS NOTE  Bufford Spikes, M.D. [facility]  DIAGNOSIS:  normocytic anemia of unclear etiology.  CURRENT THERAPY:  watchful observation.  INTERVAL HISTORY: Benjamin Hodge 70 y.o. male returns for regular follow up.  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  He still has intermittent fatigue.  There is no relation between his diet, mood, work schedule and his fatigue.  Fatigue is mild-moderate but does not interfere with his activities of daily living.  He denies all source of bleeding such as hematemesis, hemoptysis, epistaxis, hematochezia, hematuria, melena.  He denies headache, mucositis, nausea vomiting, chest pain, palpitation, abdominal pain, abdominal swelling, diarrhea, constipation, low back pain, bladder incontinence.  He has normal libido; however has had erectile dysfunction for many years.   When he saw me about 2 years ago, I recommended him to go on testosterone replacement with his PCP;however, he never got a chance to start this conversation.  He now has a new PCP, Dr. Renato Gails.   MEDICAL HISTORY: Past Medical History  Diagnosis Date  . Shoulder pain, left   . Malnutrition 11/28/2008  . Right upper lobe pneumonia 11/28/2008  . Anxiety 07/18/2008  . Irritable bowel syndrome 04/27/2008  . Nocturia 03/13/2008  . COPD (chronic obstructive pulmonary disease) 02/27/2008  . B12 deficiency 02/16/2008    in 5/09: B12 262, MMA 1040  . Chronic diarrhea 01/01/2008    s/p EGD 10/06: H pylori + gastritis, duodenal biopsy normal (Dr. Elnoria Howard); s/p colonoscopy 10/06: hemorrhoids (Dr. Elnoria Howard); evaluated by Dr. Yancey Flemings  in 8/09: tissue transglutaminase AB negative, VIP normal, stool fat content normal, urine 5-HIAA normal; normal GES 11/09 (done for "fluctuating sugars")  . Mild nonproliferative diabetic retinopathy 11/18/2007  . Leg pain, right 09/29/2007  . Fatigue 02/28/2007  . Polyneuropathy in diabetes 02/09/2007  .  Hyperkalemia 02/09/2007  . Orthostatic hypotension 02/09/2007  . Hypertension 02/09/2007  . Spinal stenosis in cervical region 05/03    MRI  . Erectile dysfunction 09/27/2006    s/p penile implant  . Anemia, iron deficiency 09/27/2006    neg colonoscopy 2006 - Dr. Elnoria Howard; ferritin 152; hgb  15.7  on 07/07  . Hypothyroidism 09/27/2006    TSH 2.639  07/07  . Hypersomnia 09/27/2006    evaluated by Dr. Jetty Duhamel (5/08) and Dr. Vickey Huger; PSG 8/09: chronic delayed sleep phase syndrome (patient sleeps during the day and is awake at night), nocturnal myoclonus (eval for RLS and IDA suggested). Pt advised to change sleeping behavior  . Hypertension 09/27/2006  . Diabetes mellitus type II 09/07/1984    poorly controlled, complicated by peripheral neuropathy, microalbuminuria, mild non proliferative retinopathy, s/p DKA 7/05, on insulin pump x 4/09, started a pump vacation on 11/19/2008  . Hyperlipidemia     SURGICAL HISTORY:  Past Surgical History  Procedure Date  . Penile prosthesis implant   . Tonsillectomy     MEDICATIONS: Current Outpatient Prescriptions  Medication Sig Dispense Refill  . ferrous sulfate 325 (65 FE) MG tablet Take 325 mg by mouth 3 (three) times daily with meals.        . hydrochlorothiazide 25 MG tablet Take 25 mg by mouth daily.        Marland Kitchen HYDROcodone-acetaminophen (VICODIN) 5-500 MG per tablet       . insulin lispro (HUMALOG) 100 UNIT/ML injection Inject into the skin 3 (three) times daily before meals.        Marland Kitchen LANTUS SOLOSTAR 100 UNIT/ML injection       .  levothyroxine (SYNTHROID, LEVOTHROID) 50 MCG tablet Take 50 mcg by mouth daily.        . methylphenidate (RITALIN) 20 MG tablet       . metoprolol tartrate (LOPRESSOR) 25 MG tablet       . ONE TOUCH ULTRA TEST test strip        Current Facility-Administered Medications  Medication Dose Route Frequency Provider Last Rate Last Dose  . influenza  inactive virus vaccine (FLUZONE/FLUARIX) injection 0.5 mL  0.5 mL  Intramuscular Prior to discharge Jethro Bolus, MD   0.5 mL at 07/27/11 1530    ALLERGIES:   has no known allergies.  REVIEW OF SYSTEMS:  The rest of the 14-point review of system was negative.   Filed Vitals:   07/27/11 1440  BP: 110/56  Pulse: 60  Temp: 97.8 F (36.6 C)   Wt Readings from Last 3 Encounters:  07/27/11 132 lb 9.6 oz (60.147 kg)  11/06/10 133 lb 6.4 oz (60.51 kg)  07/03/09 134 lb (60.782 kg)   ECOG Performance status: 1  PHYSICAL EXAMINATION:   General:  Thin-appearing man in no acute distress.  Eyes:  no scleral icterus.  ENT:  There were no oropharyngeal lesions.  Neck was without thyromegaly.  Lymphatics:  Negative cervical, supraclavicular or axillary adenopathy.  Respiratory: lungs were clear bilaterally without wheezing or crackles.  Cardiovascular:  Regular rate and rhythm, S1/S2, without murmur, rub or gallop.  There was no pedal edema.  GI:  abdomen was soft, flat, nontender, nondistended, without organomegaly.  Rectal exam with normal rectal tone; rare fecal material guiac negative.   Muscoloskeletal:  no spinal tenderness of palpation of vertebral spine.  Skin exam was without echymosis, petichae.  Neuro exam was nonfocal.  Patient was able to get on and off exam table without assistance.  Gait was normal.  Patient was alerted and oriented.  Attention was good.   Language was appropriate.  Mood was normal without depression.  Speech was not pressured.  Thought content was not tangential.     LABORATORY/RADIOLOGY DATA: WBC 8.4; Hgb 9.5; Plt 266.  ASSESSMENT AND PLAN:   1.  Normocytic anemia:  Extensive workup in the past was negative except for hopogonadism.  I recommended to follow up with another testosterone level today.  If it is still low, I recommended starting replacement with goal to increase his stamina, decrease fatigue, improve his Hgb and quality of life.  The side effects profile of testosterone are limited to electrolyte abnormalities, behavior  changes, increasing PSA.  I encouraged him to also f/u with a new urologist since his previous urologist has since retired.  He would like to wait for today testosterone level and will decide. If despite testosterone replacement, and he still has anemia, then further work up including diagnostic bone marrow biopsy may be considered.  We discussed the rare possibility of primary bone marrow failure state such as MDS or pure red cell aplasia.  There differentials are low of my list at this time; however, they need to be ruled out with a  Bone marrow biopsy if there is no improvement of his anemia with testosterone replacement.  I have pending repeating iron panel, Vit B12/folate.  If he has iron deficiency, then I will refer him back to Dr. Elnoria Howard.  His last routine screening colonoscopy was about 8 years ago per his recollection which was negative.   2.  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  I advised him to  f/u with his PCP for further endocrine work up as appropriate.   3.  Fatigue:  query if due to hypogonadism.  Hopefully, this will improve with testosterone replacement and subsequently improvement of his anemia.   4  Follow up:  lab only in 2 and 4 months.  I will see him in 6 months.   5.  Primary care:  I discussed with him the pros and cons of influenza vaccination.  He expressed informed understanding and wished to go ahead with the vaccination today.   6.  HTN:  Good control of BP with HCTZ per PCP.   7.  Hypothyroidism: on synthroid per PCP.   Total length of time of the face-to-face encounter was 30 minutes.  More than 50% of time was spent in counseling and coordination of care.

## 2011-07-27 NOTE — Telephone Encounter (Signed)
gv pt appt schedule for jan thru may

## 2011-08-08 DIAGNOSIS — E291 Testicular hypofunction: Secondary | ICD-10-CM

## 2011-08-08 HISTORY — DX: Testicular hypofunction: E29.1

## 2011-08-11 ENCOUNTER — Telehealth: Payer: Self-pay | Admitting: *Deleted

## 2011-08-11 NOTE — Telephone Encounter (Signed)
Pt left VM states he was returning Dr. Lodema Pilot call from last week.  Called pt back and he did not answer.  I left VM informing him Dr. Gaylyn Rong out of office until Monday, but to call back to speak w/ nurse if he needs.

## 2011-08-19 ENCOUNTER — Encounter: Payer: Self-pay | Admitting: Oncology

## 2011-08-19 NOTE — Progress Notes (Unsigned)
I called pt and talked with him today.  I informed him that his hypotestosterone can account for his fatigue and anemia.  He is not sure about starting testosterone due to its potential side effects.  I advised him to follow up with his established urologist with [facility] to talk about this further.

## 2011-08-26 ENCOUNTER — Telehealth: Payer: Self-pay | Admitting: *Deleted

## 2011-08-26 NOTE — Telephone Encounter (Signed)
VM from pt asking Dr. Gaylyn Rong to prescribed something for his "finding of low testosterone."   Note forwarded to Dr. Gaylyn Rong.

## 2011-08-27 NOTE — Telephone Encounter (Signed)
I advised him to talk with his PCP or Urologist to prescribe testosterone for him.

## 2011-09-24 ENCOUNTER — Other Ambulatory Visit: Payer: Medicare Other | Admitting: Lab

## 2011-10-26 NOTE — Progress Notes (Signed)
Received progress notes from [facility]; forwarded to Dr. Ha. 

## 2011-11-25 ENCOUNTER — Other Ambulatory Visit: Payer: Medicare Other | Admitting: Lab

## 2012-01-22 ENCOUNTER — Other Ambulatory Visit: Payer: Self-pay | Admitting: *Deleted

## 2012-01-22 ENCOUNTER — Other Ambulatory Visit (HOSPITAL_BASED_OUTPATIENT_CLINIC_OR_DEPARTMENT_OTHER): Payer: Medicare Other | Admitting: Lab

## 2012-01-22 ENCOUNTER — Telehealth: Payer: Self-pay | Admitting: Oncology

## 2012-01-22 ENCOUNTER — Ambulatory Visit: Payer: Medicare Other | Admitting: Oncology

## 2012-01-22 DIAGNOSIS — D649 Anemia, unspecified: Secondary | ICD-10-CM

## 2012-01-22 LAB — COMPREHENSIVE METABOLIC PANEL
ALT: 13 U/L (ref 0–53)
Albumin: 4 g/dL (ref 3.5–5.2)
CO2: 26 mEq/L (ref 19–32)
Glucose, Bld: 105 mg/dL — ABNORMAL HIGH (ref 70–99)
Potassium: 5.2 mEq/L (ref 3.5–5.3)
Sodium: 142 mEq/L (ref 135–145)
Total Bilirubin: 0.3 mg/dL (ref 0.3–1.2)
Total Protein: 6.9 g/dL (ref 6.0–8.3)

## 2012-01-22 LAB — CBC WITH DIFFERENTIAL/PLATELET
BASO%: 0.7 % (ref 0.0–2.0)
LYMPH%: 18.9 % (ref 14.0–49.0)
MCHC: 33 g/dL (ref 32.0–36.0)
MCV: 104.8 fL — ABNORMAL HIGH (ref 79.3–98.0)
MONO#: 0.5 10*3/uL (ref 0.1–0.9)
MONO%: 9.3 % (ref 0.0–14.0)
Platelets: 219 10*3/uL (ref 140–400)
RBC: 2.9 10*6/uL — ABNORMAL LOW (ref 4.20–5.82)
RDW: 13.1 % (ref 11.0–14.6)
WBC: 5.8 10*3/uL (ref 4.0–10.3)
nRBC: 0 % (ref 0–0)

## 2012-01-22 NOTE — Telephone Encounter (Signed)
md appt r/s due to pt being late to 5/30  aom

## 2012-01-27 ENCOUNTER — Ambulatory Visit: Payer: Medicare Other | Admitting: Oncology

## 2012-01-28 ENCOUNTER — Telehealth: Payer: Self-pay | Admitting: Oncology

## 2012-01-28 NOTE — Telephone Encounter (Signed)
called pt and r/s appt on 05/30 to 06/03. d/t per pt moved per Dr. Gaylyn Rong

## 2012-02-04 ENCOUNTER — Ambulatory Visit: Payer: Medicare Other | Admitting: Oncology

## 2012-02-08 ENCOUNTER — Other Ambulatory Visit: Payer: Self-pay | Admitting: *Deleted

## 2012-02-08 ENCOUNTER — Telehealth: Payer: Self-pay | Admitting: Oncology

## 2012-02-08 ENCOUNTER — Ambulatory Visit (HOSPITAL_BASED_OUTPATIENT_CLINIC_OR_DEPARTMENT_OTHER): Payer: Medicare Other | Admitting: Oncology

## 2012-02-08 ENCOUNTER — Encounter: Payer: Self-pay | Admitting: Oncology

## 2012-02-08 VITALS — BP 131/60 | HR 72 | Temp 97.0°F | Ht 66.0 in | Wt 134.6 lb

## 2012-02-08 DIAGNOSIS — R5381 Other malaise: Secondary | ICD-10-CM

## 2012-02-08 DIAGNOSIS — E039 Hypothyroidism, unspecified: Secondary | ICD-10-CM

## 2012-02-08 DIAGNOSIS — E291 Testicular hypofunction: Secondary | ICD-10-CM

## 2012-02-08 DIAGNOSIS — Z862 Personal history of diseases of the blood and blood-forming organs and certain disorders involving the immune mechanism: Secondary | ICD-10-CM

## 2012-02-08 DIAGNOSIS — D539 Nutritional anemia, unspecified: Secondary | ICD-10-CM

## 2012-02-08 NOTE — Telephone Encounter (Signed)
gv pt appt schedule for sept/dec 2013 and march 2014.

## 2012-02-08 NOTE — Patient Instructions (Signed)
A.  Anemia from the following possibilities:  Chronic renal disease; low thyroid and low testosterone. B.  Please contact your primary care physician to work up the reason for low testosterone (you may have pan hypopituitarism) which needs to be treated by primary care physician.  Testosterone replacement may improve your hemoglobin but it is not the answer to everything; and testosterone replacement may slightly increase the risk of prostate cancer.   C.  Follow up:  Lab-appointment in about 3 and 6 months.  Return visit with my Nurse Practitioner Belenda Cruise in about 9 months.

## 2012-02-08 NOTE — Progress Notes (Signed)
[facility]  [phone] [phone]   OFFICE PROGRESS NOTE   DIAGNOSIS: DIAGNOSIS: normocytic anemia; most likely hypogonadism and hypothyroidism.   CURRENT THERAPY: treatment of underlying diseases.     INTERVAL HISTORY: Benjamin Hodge 71 y.o. male returns for regular follow up by himself.  He reported that Dr. Renato Gails started him on testosterone injection x 2 (one month apart); however, he did not see significant improvement of his testosterone level.  Therefore, he has stopped this.  He has not tried testosterone cream/patch.  He still has moderate intermittent generalized fatigue.  He is still working part time as a Training and development officer.  He denied visible source of bleeding.  Patient denies headache, visual changes, confusion, drenching night sweats, palpable lymph node swelling, mucositis, odynophagia, dysphagia, nausea vomiting, jaundice, chest pain, palpitation, shortness of breath, dyspnea on exertion, productive cough, gum bleeding, epistaxis, hematemesis, hemoptysis, abdominal pain, abdominal swelling, early satiety, melena, hematochezia, hematuria, skin rash, spontaneous bleeding, joint swelling, joint pain, heat or cold intolerance, bowel bladder incontinence, back pain, focal motor weakness, paresthesia, depression, suicidal or homocidal ideation, feeling hopelessness.    Past Medical History  Diagnosis Date  . Shoulder pain, left   . Malnutrition 11/28/2008  . Right upper lobe pneumonia 11/28/2008  . Anxiety 07/18/2008  . Irritable bowel syndrome 04/27/2008  . Nocturia 03/13/2008  . COPD (chronic obstructive pulmonary disease) 02/27/2008  . B12 deficiency 02/16/2008    in 5/09: B12 262, MMA 1040  . Chronic diarrhea 01/01/2008    s/p EGD 10/06: H pylori + gastritis, duodenal biopsy normal (Dr. Elnoria Howard); s/p colonoscopy 10/06: hemorrhoids (Dr. Elnoria Howard); evaluated by Dr. Yancey Flemings  in 8/09: tissue transglutaminase AB negative, VIP normal, stool fat  content normal, urine 5-HIAA normal; normal GES 11/09 (done for "fluctuating sugars")  . Mild nonproliferative diabetic retinopathy 11/18/2007  . Leg pain, right 09/29/2007  . Fatigue 02/28/2007  . Polyneuropathy in diabetes 02/09/2007  . Hyperkalemia 02/09/2007  . Orthostatic hypotension 02/09/2007  . Hypertension 02/09/2007  . Spinal stenosis in cervical region 05/03    MRI  . Erectile dysfunction 09/27/2006    s/p penile implant  . Anemia, iron deficiency 09/27/2006    neg colonoscopy 2006 - Dr. Elnoria Howard; ferritin 152; hgb  15.7  on 07/07  . Hypothyroidism 09/27/2006    TSH 2.639  07/07  . Hypersomnia 09/27/2006    evaluated by Dr. Jetty Duhamel (5/08) and Dr. Vickey Huger; PSG 8/09: chronic delayed sleep phase syndrome (patient sleeps during the day and is awake at night), nocturnal myoclonus (eval for RLS and IDA suggested). Pt advised to change sleeping behavior  . Hypertension 09/27/2006  . Diabetes mellitus type II 09/07/1984    poorly controlled, complicated by peripheral neuropathy, microalbuminuria, mild non proliferative retinopathy, s/p DKA 7/05, on insulin pump x 4/09, started a pump vacation on 11/19/2008  . Hyperlipidemia     Past Surgical History  Procedure Date  . Penile prosthesis implant   . Tonsillectomy     Current Outpatient Prescriptions  Medication Sig Dispense Refill  . ferrous sulfate 325 (65 FE) MG tablet Take 325 mg by mouth 2 (two) times daily.       Marland Kitchen gabapentin (NEURONTIN) 100 MG capsule Take 100 mg by mouth 3 (three) times daily.       . hydrochlorothiazide 25 MG tablet Take 25 mg by mouth daily.        Marland Kitchen HYDROcodone-acetaminophen (VICODIN) 5-500 MG per tablet Take 1 tablet by mouth every 4 (four)  hours as needed.       . insulin lispro (HUMALOG) 100 UNIT/ML injection Inject into the skin 3 (three) times daily before meals.        Marland Kitchen LANTUS SOLOSTAR 100 UNIT/ML injection Inject 6 Units into the skin 3 (three) times daily before meals.       Marland Kitchen levothyroxine  (SYNTHROID, LEVOTHROID) 50 MCG tablet Take 50 mcg by mouth daily.        . methylphenidate (RITALIN) 20 MG tablet Take 20 mg by mouth 2 (two) times daily.       . metoprolol tartrate (LOPRESSOR) 25 MG tablet       . ONE TOUCH ULTRA TEST test strip         ALLERGIES:   has no known allergies.  REVIEW OF SYSTEMS:  The rest of the 14-point review of system was negative.   Filed Vitals:   02/08/12 1423  BP: 131/60  Pulse: 72  Temp: 97 F (36.1 C)   Wt Readings from Last 3 Encounters:  02/08/12 134 lb 9.6 oz (61.054 kg)  07/27/11 132 lb 9.6 oz (60.147 kg)  11/06/10 133 lb 6.4 oz (60.51 kg)   ECOG Performance status: 1  PHYSICAL EXAMINATION:  General:  Thin-appearing man,  in no acute distress.  Eyes:  no scleral icterus.  ENT:  There were no oropharyngeal lesions.  Neck was without thyromegaly.  Lymphatics:  Negative cervical, supraclavicular or axillary adenopathy.  Respiratory: lungs were clear bilaterally without wheezing or crackles.  Cardiovascular:  Regular rate and rhythm, S1/S2, without murmur, rub or gallop.  There was no pedal edema.  GI:  abdomen was soft, flat, nontender, nondistended, without organomegaly.  Muscoloskeletal:  no spinal tenderness of palpation of vertebral spine.  Skin exam was without echymosis, petichae.  Neuro exam was nonfocal.  Patient was able to get on and off exam table without assistance.  Gait was normal.  Patient was alerted and oriented.  Attention was good.   Language was appropriate.  Mood was normal without depression.  Speech was not pressured.  Thought content was not tangential.     LABORATORY/RADIOLOGY DATA:  Lab Results  Component Value Date   WBC 5.8 01/22/2012   HGB 10.0* 01/22/2012   HCT 30.4* 01/22/2012   PLT 219 01/22/2012   GLUCOSE 105* 01/22/2012   CHOL  Value: 122        ATP III CLASSIFICATION:  <200     mg/dL   Desirable  409-811  mg/dL   Borderline High  >=914    mg/dL   High        7/82/9562   TRIG 96 11/26/2008   HDL 25*  11/26/2008   LDLCALC  Value: 78        Total Cholesterol/HDL:CHD Risk Coronary Heart Disease Risk Table                     Men   Women  1/2 Average Risk   3.4   3.3  Average Risk       5.0   4.4  2 X Average Risk   9.6   7.1  3 X Average Risk  23.4   11.0        Use the calculated Patient Ratio above and the CHD Risk Table to determine the patient's CHD Risk.        ATP III CLASSIFICATION (LDL):  <100     mg/dL   Optimal  130-865  mg/dL  Near or Above                    Optimal  130-159  mg/dL   Borderline  161-096  mg/dL   High  >045     mg/dL   Very High 12/14/8117   ALKPHOS 65 01/22/2012   ALT 13 01/22/2012   AST 19 01/22/2012   NA 142 01/22/2012   K 5.2 01/22/2012   CL 108 01/22/2012   CREATININE 1.12 01/22/2012   BUN 22 01/22/2012   CO2 26 01/22/2012   PSA 0.30 03/04/2009   INR 0.89 08/06/2010   HGBA1C 7.9 03/22/2009   MICROALBUR 1.35 09/17/2008    ASSESSMENT AND PLAN:   1. Normocytic now macrocytic anemia: Extensive workup in the past was negative except for hopogonadism.  Past work up for VitB12 and folate were negative.  He has hypogonadism and hypothyroidism which can account for the anemia.  I cannot rule out MDS; however, it is still not very high on my list of differential due to mild chronic anemia and not pancytopenia.  In the future, if despite repletion of testosterone, and he is still severely anemic, then a daignostic bone marrow biopsy may be considered at that time.   His last colonoscopy was more than 8 years ago.  I referred patient to Dr. Elnoria Howard to see when the next colonoscopy is due.   2. Hypogonadisim in addition to hypothyroidism:  I deferred to Dr. Renato Gails and Dr. Lucianne Muss to see if he needs work up to rule out panhypopituitarism.    3. Fatigue: query if due to hypogonadism.  I advised patient to follow up with Dr. Renato Gails to see if other formulations of testosterone would work for him.   4. HTN: Good control of BP with HCTZ per PCP.   5. Hypothyroidism: on synthroid per PCP.   6.    Follow up: lab only in 3, and 6 months.    Return visit in about 9 months.     The length of time of the face-to-face encounter was 15 minutes. More than 50% of time was spent counseling and coordination of care.

## 2012-03-07 NOTE — Progress Notes (Signed)
Received office notes from [facility]; forwarded to Dr. Gaylyn Rong.

## 2012-04-04 ENCOUNTER — Telehealth: Payer: Self-pay

## 2012-04-04 NOTE — Telephone Encounter (Signed)
Denied Lomotil per Dr. Marina Goodell; Patient was advised to go through his pcp

## 2012-04-04 NOTE — Telephone Encounter (Signed)
Received rx for Lomotil; sent Dr. Marina Goodell message to see if ok to refill

## 2012-05-06 ENCOUNTER — Telehealth: Payer: Self-pay | Admitting: Oncology

## 2012-05-06 NOTE — Telephone Encounter (Signed)
S/w the pt regarding his lab appt has been cancelled on 05/10/2012 and moved to 05/11/2012 at the same time

## 2012-05-10 ENCOUNTER — Other Ambulatory Visit: Payer: Medicare Other

## 2012-05-11 ENCOUNTER — Telehealth: Payer: Self-pay | Admitting: Oncology

## 2012-05-11 ENCOUNTER — Other Ambulatory Visit: Payer: Medicare Other | Admitting: Lab

## 2012-05-11 NOTE — Telephone Encounter (Signed)
Pt missed lab appt and we r/s it to 9/5  aom

## 2012-05-12 ENCOUNTER — Other Ambulatory Visit (HOSPITAL_BASED_OUTPATIENT_CLINIC_OR_DEPARTMENT_OTHER): Payer: Medicare Other | Admitting: Lab

## 2012-05-12 DIAGNOSIS — E039 Hypothyroidism, unspecified: Secondary | ICD-10-CM

## 2012-05-12 DIAGNOSIS — Z862 Personal history of diseases of the blood and blood-forming organs and certain disorders involving the immune mechanism: Secondary | ICD-10-CM

## 2012-05-12 LAB — CBC WITH DIFFERENTIAL/PLATELET
BASO%: 0.7 % (ref 0.0–2.0)
EOS%: 4 % (ref 0.0–7.0)
MCH: 35.4 pg — ABNORMAL HIGH (ref 27.2–33.4)
MCHC: 33.3 g/dL (ref 32.0–36.0)
RDW: 12.9 % (ref 11.0–14.6)
WBC: 5.6 10*3/uL (ref 4.0–10.3)
lymph#: 1.1 10*3/uL (ref 0.9–3.3)

## 2012-08-09 ENCOUNTER — Encounter: Payer: Self-pay | Admitting: Oncology

## 2012-08-09 ENCOUNTER — Other Ambulatory Visit (HOSPITAL_BASED_OUTPATIENT_CLINIC_OR_DEPARTMENT_OTHER): Payer: Medicare Other | Admitting: Lab

## 2012-08-09 ENCOUNTER — Ambulatory Visit (HOSPITAL_BASED_OUTPATIENT_CLINIC_OR_DEPARTMENT_OTHER): Payer: Medicare Other | Admitting: Oncology

## 2012-08-09 VITALS — BP 142/63 | HR 65 | Temp 96.7°F | Resp 20 | Ht 66.0 in | Wt 133.5 lb

## 2012-08-09 DIAGNOSIS — Z862 Personal history of diseases of the blood and blood-forming organs and certain disorders involving the immune mechanism: Secondary | ICD-10-CM

## 2012-08-09 DIAGNOSIS — D509 Iron deficiency anemia, unspecified: Secondary | ICD-10-CM

## 2012-08-09 DIAGNOSIS — E039 Hypothyroidism, unspecified: Secondary | ICD-10-CM

## 2012-08-09 LAB — CBC WITH DIFFERENTIAL/PLATELET
Basophils Absolute: 0 10*3/uL (ref 0.0–0.1)
EOS%: 3.6 % (ref 0.0–7.0)
HCT: 27.3 % — ABNORMAL LOW (ref 38.4–49.9)
MCH: 35.6 pg — ABNORMAL HIGH (ref 27.2–33.4)
RDW: 13.3 % (ref 11.0–14.6)
WBC: 5.6 10*3/uL (ref 4.0–10.3)
lymph#: 1.1 10*3/uL (ref 0.9–3.3)

## 2012-08-09 NOTE — Progress Notes (Signed)
[facility]  Telephone:([phone]) [phone] Fax:(336) [phone]   OFFICE PROGRESS NOTE   DIAGNOSIS: DIAGNOSIS: normocytic anemia; most likely hypogonadism and hypothyroidism.   CURRENT THERAPY: treatment of underlying diseases.    INTERVAL HISTORY: Benjamin Hodge 71 y.o. male returns for regular follow up by himself.  Patient reports that he is feeling better with less fatigue. He is back on Testosterone replacement and states his testosterone level is now normal. He is still working part time as a Training and development officer.  He denied visible source of bleeding.  Patient denies headache, visual changes, confusion, drenching night sweats, palpable lymph node swelling, mucositis, odynophagia, dysphagia, nausea vomiting, jaundice, chest pain, palpitation, shortness of breath, dyspnea on exertion, productive cough, gum bleeding, epistaxis, hematemesis, hemoptysis, abdominal pain, abdominal swelling, early satiety, melena, hematochezia, hematuria, skin rash, spontaneous bleeding, joint swelling, joint pain, heat or cold intolerance, bowel bladder incontinence, back pain, focal motor weakness, paresthesia, depression, suicidal or homocidal ideation, feeling hopelessness.    Past Medical History  Diagnosis Date  . Shoulder pain, left   . Malnutrition 11/28/2008  . Right upper lobe pneumonia 11/28/2008  . Anxiety 07/18/2008  . Irritable bowel syndrome 04/27/2008  . Nocturia 03/13/2008  . COPD (chronic obstructive pulmonary disease) 02/27/2008  . B12 deficiency 02/16/2008    in 5/09: B12 262, MMA 1040  . Chronic diarrhea 01/01/2008    s/p EGD 10/06: H pylori + gastritis, duodenal biopsy normal (Dr. Elnoria Howard); s/p colonoscopy 10/06: hemorrhoids (Dr. Elnoria Howard); evaluated by Dr. Yancey Flemings  in 8/09: tissue transglutaminase AB negative, VIP normal, stool fat content normal, urine 5-HIAA normal; normal GES 11/09 (done for "fluctuating sugars")  . Mild nonproliferative diabetic retinopathy(362.04)  11/18/2007  . Leg pain, right 09/29/2007  . Fatigue 02/28/2007  . 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 Hyperkalemia 02/09/2007  . Orthostatic hypotension 02/09/2007  . Hypertension 02/09/2007  . Spinal stenosis in cervical region 05/03    MRI  . Erectile dysfunction 09/27/2006    s/p penile implant  . Anemia, iron deficiency 09/27/2006    neg colonoscopy 2006 - Dr. Elnoria Howard; ferritin 152; hgb  15.7  on 07/07  . Hypothyroidism 09/27/2006    TSH 2.639  07/07  . Hypersomnia 09/27/2006    evaluated by Dr. Jetty Duhamel (5/08) and Dr. Vickey Huger; PSG 8/09: chronic delayed sleep phase syndrome (patient sleeps during the day and is awake at night), nocturnal myoclonus (eval for RLS and IDA suggested). Pt advised to change sleeping behavior  . Hypertension 09/27/2006  . Diabetes mellitus type II 09/07/1984    poorly controlled, complicated by peripheral neuropathy, microalbuminuria, mild non proliferative retinopathy, s/p DKA 7/05, on insulin pump x 4/09, started a pump vacation on 11/19/2008  . Hyperlipidemia   . Hypogonadism male 08/2011    Past Surgical History  Procedure Date  . Penile prosthesis implant   . Tonsillectomy     Current Outpatient Prescriptions  Medication Sig Dispense Refill  . aspirin 81 MG tablet Take 81 mg by mouth every other day.      . ferrous sulfate 325 (65 FE) MG tablet Take 325 mg by mouth 2 (two) times daily.       Marland Kitchen gabapentin (NEURONTIN) 100 MG capsule Take 300 mg by mouth 3 (three) times daily.       . hydrochlorothiazide 25 MG tablet Take 25 mg by mouth daily.        Marland Kitchen HYDROcodone-acetaminophen (VICODIN) 5-500 MG per tablet Take 1 tablet by mouth every 4 (four) hours as  needed.       . insulin lispro (HUMALOG) 100 UNIT/ML injection Inject into the skin 3 (three) times daily before meals. Takes based on blood sugar results and carb intake      . LANTUS SOLOSTAR 100 UNIT/ML injection Inject 8 Units into the skin 2 (two) times daily.       Marland Kitchen  levothyroxine (SYNTHROID, LEVOTHROID) 50 MCG tablet Take 50 mcg by mouth daily.        . methylphenidate (RITALIN) 20 MG tablet Take 20 mg by mouth 2 (two) times daily.       . metoprolol tartrate (LOPRESSOR) 25 MG tablet 25 mg 2 (two) times daily.       . ONE TOUCH ULTRA TEST test strip         ALLERGIES:   has no known allergies.  REVIEW OF SYSTEMS:  The rest of the 14-point review of system was negative.   Filed Vitals:   08/09/12 1533  BP: 142/63  Pulse: 65  Temp: 96.7 F (35.9 C)  Resp: 20   Wt Readings from Last 3 Encounters:  08/09/12 133 lb 8 oz (60.555 kg)  02/08/12 134 lb 9.6 oz (61.054 kg)  07/27/11 132 lb 9.6 oz (60.147 kg)   ECOG Performance status: 1  PHYSICAL EXAMINATION:  General:  Thin-appearing man,  in no acute distress.  Eyes:  no scleral icterus.  ENT:  There were no oropharyngeal lesions.  Neck was without thyromegaly.  Lymphatics:  Negative cervical, supraclavicular or axillary adenopathy.  Respiratory: lungs were clear bilaterally without wheezing or crackles.  Cardiovascular:  Regular rate and rhythm, S1/S2, without murmur, rub or gallop.  There was no pedal edema.  GI:  abdomen was soft, flat, nontender, nondistended, without organomegaly.  Muscoloskeletal:  no spinal tenderness of palpation of vertebral spine.  Skin exam was without echymosis, petichae.  Neuro exam was nonfocal.  Patient was able to get on and off exam table without assistance.  Gait was normal.  Patient was alerted and oriented.  Attention was good.   Language was appropriate.  Mood was normal without depression.  Speech was not pressured.  Thought content was not tangential.     LABORATORY/RADIOLOGY DATA:  Lab Results  Component Value Date   WBC 5.6 08/09/2012   HGB 9.1* 08/09/2012   HCT 27.3* 08/09/2012   PLT 205 08/09/2012   GLUCOSE 105* 01/22/2012   CHOL  Value: 122        ATP III CLASSIFICATION:  <200     mg/dL   Desirable  161-096  mg/dL   Borderline High  >=045    mg/dL   High         12/14/8117   TRIG 96 11/26/2008   HDL 25* 11/26/2008   LDLCALC  Value: 78        Total Cholesterol/HDL:CHD Risk Coronary Heart Disease Risk Table                     Men   Women  1/2 Average Risk   3.4   3.3  Average Risk       5.0   4.4  2 X Average Risk   9.6   7.1  3 X Average Risk  23.4   11.0        Use the calculated Patient Ratio above and the CHD Risk Table to determine the patient's CHD Risk.        ATP III CLASSIFICATION (LDL):  <100  mg/dL   Optimal  161-096  mg/dL   Near or Above                    Optimal  130-159  mg/dL   Borderline  045-409  mg/dL   High  >811     mg/dL   Very High 05/21/7828   ALKPHOS 65 01/22/2012   ALT 13 01/22/2012   AST 19 01/22/2012   NA 142 01/22/2012   K 5.2 01/22/2012   CL 108 01/22/2012   CREATININE 1.12 01/22/2012   BUN 22 01/22/2012   CO2 26 01/22/2012   PSA 0.30 03/04/2009   INR 0.89 08/06/2010   HGBA1C 7.9 03/22/2009   MICROALBUR 1.35 09/17/2008    ASSESSMENT AND PLAN:   1. Normocytic now macrocytic anemia: Extensive workup in the past was negative except for hopogonadism.  Past work up for VitB12 and folate were negative.  He has hypogonadism and hypothyroidism which can account for the anemia.  Per patient report, testosterone level is now normal. Anemia has slightly worsened at this time. I cannot rule out MDS; however, it is still not very high on my list of differential due to mild chronic anemia and not pancytopenia.  In the future, if despite repletion of testosterone, and he is still severely anemic, then a daignostic bone marrow biopsy may be considered at that time.   His last colonoscopy was more than 8 years ago.  I referred patient to Dr. Elnoria Howard to see when the next colonoscopy is due. Will discus with Dr Gaylyn Rong to see if any additional work-up is needed at this time.  2. Hypogonadisim in addition to hypothyroidism:  I deferred to Dr. Renato Gails and Dr. Lucianne Muss to see if he needs work up to rule out panhypopituitarism.    3. Fatigue: query if due to  hypogonadism. Improved with Testosterone replacement.   4. HTN: Good control of BP with HCTZ per PCP.   5. Hypothyroidism: on synthroid per PCP.   6.   Follow up: lab only in 3 months.  Return visit in about 6 months.     The length of time of the face-to-face encounter was 15 minutes. More than 50% of time was spent counseling and coordination of care.

## 2012-08-10 ENCOUNTER — Telehealth: Payer: Self-pay | Admitting: Oncology

## 2012-08-10 NOTE — Telephone Encounter (Signed)
lvm for pt regarding March 2014 appt...advised pt to pick up new schedule @ nxt visit...mailed pt updated appt schedule for March, May, and August

## 2012-10-28 ENCOUNTER — Telehealth: Payer: Self-pay | Admitting: *Deleted

## 2012-10-28 NOTE — Telephone Encounter (Signed)
Pt left VM states he would like to proceed with "Biopsy" as discussed w/ Dr. Gaylyn Rong.  He request call back w/ a date and time.

## 2012-11-07 ENCOUNTER — Other Ambulatory Visit (HOSPITAL_BASED_OUTPATIENT_CLINIC_OR_DEPARTMENT_OTHER): Payer: Medicare Other

## 2012-11-07 ENCOUNTER — Ambulatory Visit: Payer: Medicare Other | Admitting: Oncology

## 2012-11-07 DIAGNOSIS — D638 Anemia in other chronic diseases classified elsewhere: Secondary | ICD-10-CM

## 2012-11-07 DIAGNOSIS — Z862 Personal history of diseases of the blood and blood-forming organs and certain disorders involving the immune mechanism: Secondary | ICD-10-CM

## 2012-11-07 DIAGNOSIS — E039 Hypothyroidism, unspecified: Secondary | ICD-10-CM

## 2012-11-07 LAB — COMPREHENSIVE METABOLIC PANEL (CC13)
ALT: 41 U/L (ref 0–55)
AST: 57 U/L — ABNORMAL HIGH (ref 5–34)
Albumin: 3.5 g/dL (ref 3.5–5.0)
BUN: 12.2 mg/dL (ref 7.0–26.0)
Calcium: 8.7 mg/dL (ref 8.4–10.4)
Chloride: 108 mEq/L — ABNORMAL HIGH (ref 98–107)
Potassium: 4.9 mEq/L (ref 3.5–5.1)
Total Protein: 6.4 g/dL (ref 6.4–8.3)

## 2012-11-07 LAB — CBC WITH DIFFERENTIAL/PLATELET
BASO%: 0.6 % (ref 0.0–2.0)
EOS%: 2.9 % (ref 0.0–7.0)
HGB: 10.5 g/dL — ABNORMAL LOW (ref 13.0–17.1)
MCH: 35.2 pg — ABNORMAL HIGH (ref 27.2–33.4)
MCHC: 33.6 g/dL (ref 32.0–36.0)
RBC: 2.98 10*6/uL — ABNORMAL LOW (ref 4.20–5.82)
RDW: 13.8 % (ref 11.0–14.6)
lymph#: 1.2 10*3/uL (ref 0.9–3.3)

## 2012-11-17 ENCOUNTER — Telehealth: Payer: Self-pay | Admitting: Oncology

## 2012-11-17 NOTE — Telephone Encounter (Signed)
returned pt call and advised if he wanted to r/s to call back and lvm d/t that he wants

## 2012-11-22 ENCOUNTER — Telehealth: Payer: Self-pay | Admitting: *Deleted

## 2012-11-22 NOTE — Telephone Encounter (Signed)
pt came into the office and needed to r/s BX...Marland Kitchenemialed Dr. Gaylyn Rong...waiting to respons

## 2012-11-23 ENCOUNTER — Other Ambulatory Visit: Payer: Self-pay | Admitting: *Deleted

## 2012-11-23 ENCOUNTER — Other Ambulatory Visit: Payer: Self-pay | Admitting: Neurology

## 2012-11-23 DIAGNOSIS — G4730 Sleep apnea, unspecified: Secondary | ICD-10-CM

## 2012-11-23 MED ORDER — METHYLPHENIDATE HCL 20 MG PO TABS: 20.0000 mg | ORAL_TABLET | Freq: Two times a day (BID) | ORAL | Status: DC

## 2012-11-23 MED ORDER — HYDROCODONE-ACETAMINOPHEN 5-325MG PREPACK (~~LOC~~: ORAL_TABLET | ORAL | Status: DC

## 2012-11-23 NOTE — Telephone Encounter (Signed)
Patient would like to get a written rx for generic Ritalin.  Dr Vickey Huger is out of the office.  Dr Eulah Citizen.

## 2012-11-23 NOTE — Telephone Encounter (Signed)
I got it. Refilled.

## 2012-11-30 ENCOUNTER — Other Ambulatory Visit: Payer: Self-pay | Admitting: *Deleted

## 2012-11-30 DIAGNOSIS — I10 Essential (primary) hypertension: Secondary | ICD-10-CM

## 2012-11-30 DIAGNOSIS — F909 Attention-deficit hyperactivity disorder, unspecified type: Secondary | ICD-10-CM

## 2012-11-30 DIAGNOSIS — E1149 Type 2 diabetes mellitus with other diabetic neurological complication: Secondary | ICD-10-CM

## 2012-11-30 DIAGNOSIS — G609 Hereditary and idiopathic neuropathy, unspecified: Secondary | ICD-10-CM

## 2012-12-13 ENCOUNTER — Encounter: Payer: Self-pay | Admitting: Internal Medicine

## 2012-12-22 ENCOUNTER — Telehealth: Payer: Self-pay | Admitting: Oncology

## 2012-12-22 NOTE — Telephone Encounter (Signed)
...  mailed pt appt schedule and letter for May

## 2012-12-22 NOTE — Telephone Encounter (Signed)
LVM FOR PT REGARDING MOVING 5.26.14 LAB TO 5.28.14

## 2012-12-27 ENCOUNTER — Other Ambulatory Visit: Payer: Self-pay | Admitting: *Deleted

## 2012-12-29 ENCOUNTER — Other Ambulatory Visit: Payer: Self-pay | Admitting: *Deleted

## 2013-01-03 ENCOUNTER — Other Ambulatory Visit: Payer: Self-pay | Admitting: *Deleted

## 2013-01-09 ENCOUNTER — Other Ambulatory Visit: Payer: Self-pay | Admitting: Internal Medicine

## 2013-01-16 ENCOUNTER — Encounter: Payer: Self-pay | Admitting: Internal Medicine

## 2013-01-16 ENCOUNTER — Other Ambulatory Visit: Payer: Self-pay

## 2013-01-16 ENCOUNTER — Ambulatory Visit (INDEPENDENT_AMBULATORY_CARE_PROVIDER_SITE_OTHER): Payer: Medicare Other | Admitting: Internal Medicine

## 2013-01-16 VITALS — BP 100/50 | HR 76 | Ht 64.57 in | Wt 134.5 lb

## 2013-01-16 DIAGNOSIS — R197 Diarrhea, unspecified: Secondary | ICD-10-CM

## 2013-01-16 DIAGNOSIS — K589 Irritable bowel syndrome without diarrhea: Secondary | ICD-10-CM

## 2013-01-16 DIAGNOSIS — R7402 Elevation of levels of lactic acid dehydrogenase (LDH): Secondary | ICD-10-CM

## 2013-01-16 MED ORDER — DIPHENOXYLATE-ATROPINE 2.5-0.025 MG PO TABS: 1.0000 | ORAL_TABLET | Freq: Three times a day (TID) | ORAL | Status: DC | PRN

## 2013-01-16 NOTE — Patient Instructions (Addendum)
We have sent the following medications to your pharmacy for you to pick up at your convenience: Lomotil   

## 2013-01-16 NOTE — Progress Notes (Signed)
HISTORY OF PRESENT ILLNESS:  Benjamin Hodge is a 72 y.o. male with multiple medical problems including insulin requiring diabetes mellitus, hyperlipidemia, hypothyroidism, hypertension, hypersomnolence disorder, major depression, spinal stenosis, and diarrhea predominant irritable bowel syndrome. Patient has been seen previously regarding chronic diarrhea. He has undergone extensive workup which has yielded no organic cause. Felt to have IBS. Last seen in October of 2010. At that time, treated with psyllium, Lomotil, probiotics on demand. Also minimize sugar substitutes and artificial sweeteners. Seemed to be doing better. Has not been seen since. Reports worsening diarrhea. Some nocturnal symptoms. Food is a trigger. No bleeding, abdominal pain, or weight loss. Diabetes has been under suboptimal control. Review of outside laboratories from March of this year reveal hemoglobin 10.5. MCV 104.7. Normal comprehensive metabolic panel except for mild elevation of AST and elevated glucose. He has not used Lomotil  for some time. He is not sure why.  REVIEW OF SYSTEMS:  All non-GI ROS negative except for anxiety, back pain, depression, fatigue, insomnia, ankle swelling  Past Medical History  Diagnosis Date  . Shoulder pain, left   . Malnutrition 11/28/2008  . Right upper lobe pneumonia 11/28/2008  . Anxiety 07/18/2008  . Irritable bowel syndrome 04/27/2008  . Nocturia 03/13/2008  . COPD (chronic obstructive pulmonary disease) 02/27/2008  . B12 deficiency 02/16/2008    in 5/09: B12 262, MMA 1040  . Chronic diarrhea 01/01/2008    s/p EGD 10/06: H pylori + gastritis, duodenal biopsy normal (Dr. Elnoria Howard); s/p colonoscopy 10/06: hemorrhoids (Dr. Elnoria Howard); evaluated by Dr. Yancey Flemings  in 8/09: tissue transglutaminase AB negative, VIP normal, stool fat content normal, urine 5-HIAA normal; normal GES 11/09 (done for "fluctuating sugars")  . 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 Leg  pain, right 09/29/2007  . Fatigue 02/28/2007  . 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 Hyperkalemia 02/09/2007  . Orthostatic hypotension 02/09/2007  . Hypertension 02/09/2007  . Spinal stenosis in cervical region 05/03    MRI  . Erectile dysfunction 09/27/2006    s/p penile implant  . Anemia, iron deficiency 09/27/2006    neg colonoscopy 2006 - Dr. Elnoria Howard; ferritin 152; hgb  15.7  on 07/07  . Hypothyroidism 09/27/2006    TSH 2.639  07/07  . Hypersomnia 09/27/2006    evaluated by Dr. Jetty Duhamel (5/08) and Dr. Vickey Huger; PSG 8/09: chronic delayed sleep phase syndrome (patient sleeps during the day and is awake at night), nocturnal myoclonus (eval for RLS and IDA suggested). Pt advised to change sleeping behavior  . Diabetes mellitus type II 09/07/1984    poorly controlled, complicated by peripheral neuropathy, microalbuminuria, mild non proliferative retinopathy, s/p DKA 7/05, on insulin pump x 4/09, started a pump vacation on 11/19/2008  . Hyperlipidemia   . 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 Hemorrhoids     Past Surgical History  Procedure Laterality Date  . Penile prosthesis implant    . Tonsillectomy      Social History JIHAAD BRUSCHI  reports that he quit smoking about 9 years ago. His smoking use included Cigarettes. He smoked 0.00 packs per day. He has never used smokeless tobacco. He reports that  drinks alcohol. He reports that he does not use illicit drugs.  family history includes Bone cancer in his mother; Breast cancer in his mother and sister; Cancer in his sister; and Lung cancer in his brother and father.  Allergies  Allergen Reactions  . Bee Venom Swelling       PHYSICAL EXAMINATION: Vital signs: BP  100/50  Pulse 76  Ht 5' 4.57" (1.64 m)  Wt 134 lb 8 oz (61.009 kg)  BMI 22.68 kg/m2 General: Well-developed, well-nourished, no acute distress HEENT: Sclerae are anicteric, conjunctiva pink. Oral mucosa intact Lungs: Clear Heart: Regular Abdomen:  soft, nontender, nondistended, no obvious ascites, no peritoneal signs, normal bowel sounds. No organomegaly. Extremities: No edema Psychiatric: alert and oriented x3. Cooperative   ASSESSMENT:  #1. Diarrhea predominant IBS. Ongoing and deteriorated #2. Multiple medical problems #3. Mild elevation of AST and elevation of MCV. Question alcohol affect   PLAN:  #1. Prescribed Lomotil one by mouth 3 times a day when necessary. Titrated to need. #2. Better controlled diabetes #3. Avoid excessive sugar substitutes #4. Due for routine surveillance colonoscopy October 2016 #5. Routine GI followup in 1 year

## 2013-01-17 ENCOUNTER — Other Ambulatory Visit: Payer: Self-pay

## 2013-01-18 ENCOUNTER — Other Ambulatory Visit: Payer: Medicare Other

## 2013-01-18 ENCOUNTER — Other Ambulatory Visit: Payer: Self-pay | Admitting: *Deleted

## 2013-01-18 DIAGNOSIS — E785 Hyperlipidemia, unspecified: Secondary | ICD-10-CM

## 2013-01-18 DIAGNOSIS — E039 Hypothyroidism, unspecified: Secondary | ICD-10-CM

## 2013-01-18 DIAGNOSIS — D649 Anemia, unspecified: Secondary | ICD-10-CM

## 2013-01-19 LAB — HEMOGLOBIN A1C
Est. average glucose Bld gHb Est-mCnc: 134 mg/dL
Hgb A1c MFr Bld: 6.3 % — ABNORMAL HIGH (ref 4.8–5.6)

## 2013-01-19 LAB — BASIC METABOLIC PANEL
BUN/Creatinine Ratio: 16 (ref 10–22)
BUN: 17 mg/dL (ref 8–27)
CO2: 25 mmol/L (ref 19–28)
Calcium: 8.9 mg/dL (ref 8.6–10.2)
Chloride: 111 mmol/L — ABNORMAL HIGH (ref 97–108)
Creatinine, Ser: 1.05 mg/dL (ref 0.76–1.27)
GFR calc Af Amer: 83 mL/min/{1.73_m2} (ref >59)
GFR calc non Af Amer: 72 mL/min/{1.73_m2} (ref >59)
Glucose: 87 mg/dL (ref 65–99)
Potassium: 5.2 mmol/L (ref 3.5–5.2)
Sodium: 144 mmol/L (ref 134–144)

## 2013-01-19 LAB — CBC WITH DIFFERENTIAL/PLATELET
Basophils Absolute: 0 10*3/uL (ref 0.0–0.2)
Basos: 0 % (ref 0–3)
Eos: 3 % (ref 0–5)
Eosinophils Absolute: 0.2 10*3/uL (ref 0.0–0.4)
HCT: 33.1 % — ABNORMAL LOW (ref 37.5–51.0)
Hemoglobin: 10.9 g/dL — ABNORMAL LOW (ref 12.6–17.7)
Immature Grans (Abs): 0 10*3/uL (ref 0.0–0.1)
Immature Granulocytes: 0 % (ref 0–2)
Lymphocytes Absolute: 1.2 10*3/uL (ref 0.7–3.1)
Lymphs: 19 % (ref 14–46)
MCH: 32.9 pg (ref 26.6–33.0)
MCHC: 32.9 g/dL (ref 31.5–35.7)
MCV: 100 fL — ABNORMAL HIGH (ref 79–97)
Monocytes Absolute: 0.7 10*3/uL (ref 0.1–0.9)
Monocytes: 12 % (ref 4–12)
Neutrophils Absolute: 3.9 10*3/uL (ref 1.4–7.0)
Neutrophils Relative %: 66 % (ref 40–74)
RBC: 3.31 x10E6/uL — ABNORMAL LOW (ref 4.14–5.80)
RDW: 13.4 % (ref 12.3–15.4)
WBC: 6 10*3/uL (ref 3.4–10.8)

## 2013-01-23 ENCOUNTER — Ambulatory Visit (INDEPENDENT_AMBULATORY_CARE_PROVIDER_SITE_OTHER): Payer: Medicare Other | Admitting: Internal Medicine

## 2013-01-23 ENCOUNTER — Encounter: Payer: Self-pay | Admitting: Internal Medicine

## 2013-01-23 ENCOUNTER — Encounter: Payer: Self-pay | Admitting: Geriatric Medicine

## 2013-01-23 VITALS — BP 140/70 | HR 47 | Temp 97.9°F | Resp 20 | Ht 66.0 in | Wt 134.0 lb

## 2013-01-23 DIAGNOSIS — E1142 Type 2 diabetes mellitus with diabetic polyneuropathy: Secondary | ICD-10-CM

## 2013-01-23 DIAGNOSIS — K589 Irritable bowel syndrome without diarrhea: Secondary | ICD-10-CM

## 2013-01-23 DIAGNOSIS — Z862 Personal history of diseases of the blood and blood-forming organs and certain disorders involving the immune mechanism: Secondary | ICD-10-CM

## 2013-01-23 DIAGNOSIS — E109 Type 1 diabetes mellitus without complications: Secondary | ICD-10-CM

## 2013-01-23 DIAGNOSIS — I10 Essential (primary) hypertension: Secondary | ICD-10-CM

## 2013-01-23 DIAGNOSIS — R7402 Elevation of levels of lactic acid dehydrogenase (LDH): Secondary | ICD-10-CM

## 2013-01-23 DIAGNOSIS — E039 Hypothyroidism, unspecified: Secondary | ICD-10-CM

## 2013-01-23 DIAGNOSIS — R7401 Elevation of levels of liver transaminase levels: Secondary | ICD-10-CM | POA: Insufficient documentation

## 2013-01-23 MED ORDER — METOPROLOL TARTRATE 25 MG PO TABS: 25.0000 mg | ORAL_TABLET | Freq: Two times a day (BID) | ORAL | Status: DC

## 2013-01-23 NOTE — Progress Notes (Signed)
Patient ID: Benjamin Hodge, male   DOB: Sep 17, 1940, 72 y.o.   MRN: [id number]  Allergies  Allergen Reactions  . Ace Inhibitors   . Angiotensin Receptor Blockers   . Bee Venom Swelling  . Cymbalta (Duloxetine Hcl)     Chief Complaint  Patient presents with  . Medical Managment of Chronic Issues    no new problems    HPI: Patient is a 72 y.o. Caucasian male seen in the office today for routine mgt of his chronic diseases.  Says he knows how to count carbs and take insulin, but his body doesn't respond properly.   Had another hypoglycemic episode (actually several lately).  He had to eat several snickers and then his glucose was 400.  Eats same thing and glucose completely different--340-350 one day, 41 the next.  Sugar average is excellent now, but affected by his anemia.  Didn't bring meter.  Numbers are all over the place despite consistent eating and carb counting.  Gabapentin helps his neuropathy.  Had one night when he woke up early and couldn't go back to sleep due to pain in his feet.  Just once in a couple of years.    Went to GI the other day.  Was advised to go back to lomotil for diarrhea-predominant IBS.  Also needs his DM to be more stable.    Had elevated transaminases on his lab work 11/07/12.  Had stopped drinking until January.  Is back to drinking two drinks at bedtime.    Notes his personality may play a role in his sugars fluctuating.  Has ADHD and taken ritalin for it.  Says he needs to say "the hell with it" and he'd be better off.    BP is right around goal.  Is very high strung personality.    Review of Systems:  Review of Systems  Constitutional: Positive for malaise/fatigue. Negative for fever and chills.  HENT: Positive for neck pain.   Eyes: Negative for blurred vision.  Respiratory: Negative for shortness of breath.   Cardiovascular: Negative for chest pain.  Gastrointestinal: Negative for heartburn.  Genitourinary: Negative for dysuria.   Musculoskeletal: Negative for falls.  Skin: Negative for rash.  Neurological: Negative for dizziness and headaches.  Psychiatric/Behavioral: Negative for depression and memory loss. The patient is nervous/anxious.      Past Medical History  Diagnosis Date  . Shoulder pain, left   . Malnutrition 11/28/2008  . Right upper lobe pneumonia 11/28/2008  . Anxiety 07/18/2008  . Irritable bowel syndrome 04/27/2008  . Nocturia 03/13/2008  . COPD (chronic obstructive pulmonary disease) 02/27/2008  . B12 deficiency 02/16/2008    in 5/09: B12 262, MMA 1040  . Chronic diarrhea 01/01/2008    s/p EGD 10/06: H pylori + gastritis, duodenal biopsy normal (Dr. Elnoria Howard); s/p colonoscopy 10/06: hemorrhoids (Dr. Elnoria Howard); evaluated by Dr. Yancey Flemings  in 8/09: tissue transglutaminase AB negative, VIP normal, stool fat content normal, urine 5-HIAA normal; normal GES 11/09 (done for "fluctuating sugars")  . 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 Leg pain, right 09/29/2007  . Fatigue 02/28/2007  . 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 Hyperkalemia 02/09/2007  . Orthostatic hypotension 02/09/2007  . Hypertension 02/09/2007  . Spinal stenosis in cervical region 05/03    MRI  . Erectile dysfunction 09/27/2006    s/p penile implant  . Anemia, iron deficiency 09/27/2006    neg colonoscopy 2006 - Dr. Elnoria Howard; ferritin 152; hgb  15.7  on 07/07  . Hypothyroidism 09/27/2006  TSH 2.639  07/07  . Hypersomnia 09/27/2006    evaluated by Dr. Jetty Duhamel (5/08) and Dr. Vickey Huger; PSG 8/09: chronic delayed sleep phase syndrome (patient sleeps during the day and is awake at night), nocturnal myoclonus (eval for RLS and IDA suggested). Pt advised to change sleeping behavior  . Diabetes mellitus type II 09/07/1984    poorly controlled, complicated by peripheral neuropathy, microalbuminuria, mild non proliferative retinopathy, s/p DKA 7/05, on insulin pump x 4/09, started a pump vacation on  11/19/2008  . Hyperlipidemia   . 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 Hemorrhoids   . Chronic kidney disease   . Unspecified hereditary and idiopathic peripheral neuropathy   . Hyperpotassemia   . Other testicular hypofunction   . Impotence of organic origin   . Other chronic pain   . Other malaise and fatigue   . Unspecified hypothyroidism   . Type II or unspecified type diabetes mellitus with neurological manifestations, not stated as uncontrolled(250.60)   . Other B-complex deficiencies   . Other organic hypersomnia   . Type I (juvenile type) diabetes mellitus without mention of complication, not stated as uncontrolled   . Depression   . Attention deficit disorder with hyperactivity   . Hypertrophy of prostate without urinary obstruction and other lower urinary tract symptoms (LUTS)   . Lumbago    Past Surgical History  Procedure Laterality Date  . Penile prosthesis implant    . Tonsillectomy     Social History:   reports that he quit smoking about 9 years ago. His smoking use included Cigarettes. He smoked 0.00 packs per day. He has never used smokeless tobacco. He reports that  drinks alcohol. He reports that he does not use illicit drugs.  Family History  Problem Relation Age of Onset  . Bone cancer Mother   . Lung cancer Father   . Breast cancer Sister   . Lung cancer Brother   . Breast cancer Mother   . Cancer Sister     type unknown    Medications: Patient's Medications  New Prescriptions   No medications on file  Previous Medications   ASPIRIN 81 MG TABLET    Take 81 mg by mouth every other day.   DIPHENOXYLATE-ATROPINE (LOMOTIL) 2.5-0.025 MG PER TABLET    Take 1 tablet by mouth 3 (three) times daily as needed for diarrhea or loose stools.   FERROUS SULFATE 325 (65 FE) MG TABLET    TAKE ONE TABLET BY MOUTH THREE TIMES DAILY   GABAPENTIN (NEURONTIN) 100 MG CAPSULE    Take 300 mg by mouth 3 (three) times daily.    HYDROCHLOROTHIAZIDE 25 MG TABLET    Take 25 mg by mouth  daily.     HYDROCODONE-ACETAMINOPHEN (VICODIN) 5-325 MG TABS    Take 1 tablet 5 times a day as needed to control pain   INSULIN LISPRO (HUMALOG) 100 UNIT/ML INJECTION    Inject into the skin 3 (three) times daily before meals. Takes based on blood sugar results and carb intake   INSULIN NPH (HUMULIN N,NOVOLIN N) 100 UNIT/ML INJECTION    Inject 3 Units into the skin at bedtime.   LANTUS SOLOSTAR 100 UNIT/ML INJECTION    Inject into the skin. 8 units in the AM and 3 units at night   LEVOTHYROXINE (SYNTHROID, LEVOTHROID) 50 MCG TABLET    Take 50 mcg by mouth daily.     METHYLPHENIDATE (RITALIN) 20 MG TABLET    Take 1 tablet (20 mg total) by mouth 2 (  two) times daily.   ONE TOUCH ULTRA TEST TEST STRIP      Modified Medications   Modified Medication Previous Medication   METOPROLOL TARTRATE (LOPRESSOR) 25 MG TABLET metoprolol tartrate (LOPRESSOR) 25 MG tablet      Take 1 tablet (25 mg total) by mouth 2 (two) times daily.    25 mg 2 (two) times daily.   Discontinued Medications   No medications on file     Physical Exam: Filed Vitals:   01/23/13 1509  BP: 140/70  Pulse: 47  Temp: 97.9 F (36.6 C)  TempSrc: Oral  Resp: 20  Height: 5\' 6"  (1.676 m)  Weight: 134 lb (60.782 kg)  SpO2: 97%  Physical Exam  Constitutional: He is oriented to person, place, and time. He appears well-developed and well-nourished.  HENT:  Head: Normocephalic and atraumatic.  Eyes: Pupils are equal, round, and reactive to light.  Cardiovascular: Normal rate, regular rhythm, normal heart sounds and intact distal pulses.   Pulmonary/Chest: Effort normal and breath sounds normal.  Abdominal: Soft. Bowel sounds are normal. He exhibits no distension. There is no tenderness.  Musculoskeletal: Normal range of motion.  Neurological: He is alert and oriented to person, place, and time.  Psychiatric:  Anxious as usual       Labs reviewed: Basic Metabolic Panel:  Recent Labs  16/10/96 1507 01/18/13 1701  NA  142 144  K 4.9 5.2  CL 108* 111*  CO2 27 25  GLUCOSE 256* 87  BUN 12.2 17  CREATININE 1.1 1.05  CALCIUM 8.7 8.9   Liver Function Tests:  Recent Labs  11/07/12 1507 01/18/13 1701  AST 57* 20  ALT 41 19  ALKPHOS 61 55  BILITOT 0.45 0.3  PROT 6.4 5.8*  ALBUMIN 3.5  --      CBC:  Recent Labs  05/12/12 1535 08/09/12 1516 11/07/12 1507 01/18/13 1701  WBC 5.6 5.6 6.7 6.0  NEUTROABS 3.7 3.6 4.5 3.9  HGB 9.7* 9.1* 10.5* 10.9*  HCT 29.2* 27.3* 31.2* 33.1*  MCV 106.1* 106.5* 104.7* 100*  PLT 207 205 208  --    Assessment/Plan DIABETES MELLITUS, TYPE I Pt has difficulty with extremes of glucose.  Has had frequent lows recently.  He has been back to drinking alcohol.  Discussed that this is likely contributing significantly to his worsened fluctuations since march.  Advised to cut back.  POLYNEUROPATHY IN DIABETES Recently stable despite erratic glucose.  HYPERTENSION Blood pressure at goal.  No changes  ANEMIA, IRON DEFICIENCY, HX OF Anemia has recently improved.  On iron supplement.  Affects his hba1c  Elevated AST (SGOT) New problem on labs at GI.  Has been drinking and it seems that this is most likely the cause.  Will repeat the liver panel today.     Labs/tests ordered

## 2013-01-24 LAB — SPECIMEN STATUS REPORT

## 2013-01-26 LAB — HEPATIC FUNCTION PANEL
ALT: 19 IU/L (ref 0–44)
AST: 20 IU/L (ref 0–40)
Albumin: 3.6 g/dL (ref 3.6–4.8)
Alkaline Phosphatase: 55 IU/L (ref 39–117)
Bilirubin, Direct: 0.14 mg/dL (ref 0.00–0.40)
Total Bilirubin: 0.3 mg/dL (ref 0.0–1.2)
Total Protein: 5.8 g/dL — ABNORMAL LOW (ref 6.0–8.5)

## 2013-01-26 LAB — SPECIMEN STATUS REPORT

## 2013-01-29 NOTE — Assessment & Plan Note (Signed)
Anemia has recently improved.  On iron supplement.  Affects his hba1c

## 2013-01-29 NOTE — Assessment & Plan Note (Signed)
Blood pressure at goal.  No changes

## 2013-01-29 NOTE — Assessment & Plan Note (Signed)
Recently stable despite erratic glucose.

## 2013-01-29 NOTE — Assessment & Plan Note (Signed)
New problem on labs at GI.  Has been drinking and it seems that this is most likely the cause.  Will repeat the liver panel today.

## 2013-01-29 NOTE — Assessment & Plan Note (Addendum)
Pt has difficulty with extremes of glucose.  Has had frequent lows recently.  He has been back to drinking alcohol.  Discussed that this is likely contributing significantly to his worsened fluctuations since march.  Advised to cut back.

## 2013-01-30 ENCOUNTER — Other Ambulatory Visit: Payer: Medicare Other | Admitting: Lab

## 2013-02-01 ENCOUNTER — Other Ambulatory Visit: Payer: Medicare Other | Admitting: Lab

## 2013-02-01 ENCOUNTER — Other Ambulatory Visit: Payer: Self-pay

## 2013-02-01 DIAGNOSIS — G4730 Sleep apnea, unspecified: Secondary | ICD-10-CM

## 2013-02-01 NOTE — Telephone Encounter (Signed)
Patient called requesting a refill on Ritalin.

## 2013-02-02 MED ORDER — METHYLPHENIDATE HCL 20 MG PO TABS: 20.0000 mg | ORAL_TABLET | Freq: Three times a day (TID) | ORAL | Status: DC

## 2013-02-25 ENCOUNTER — Other Ambulatory Visit: Payer: Self-pay | Admitting: Internal Medicine

## 2013-03-11 ENCOUNTER — Other Ambulatory Visit: Payer: Self-pay | Admitting: Internal Medicine

## 2013-03-15 ENCOUNTER — Other Ambulatory Visit: Payer: Self-pay | Admitting: *Deleted

## 2013-03-15 MED ORDER — INSULIN LISPRO 100 UNIT/ML ~~LOC~~ SOLN: 8.0000 [IU] | Freq: Three times a day (TID) | SUBCUTANEOUS | Status: DC

## 2013-03-16 ENCOUNTER — Other Ambulatory Visit: Payer: Self-pay | Admitting: *Deleted

## 2013-03-16 MED ORDER — INSULIN LISPRO 100 UNIT/ML (KWIKPEN): 8.0000 [IU] | PEN_INJECTOR | Freq: Three times a day (TID) | SUBCUTANEOUS | Status: DC

## 2013-04-05 ENCOUNTER — Telehealth: Payer: Self-pay | Admitting: *Deleted

## 2013-04-05 ENCOUNTER — Other Ambulatory Visit: Payer: Self-pay

## 2013-04-05 DIAGNOSIS — G4710 Hypersomnia, unspecified: Secondary | ICD-10-CM

## 2013-04-05 MED ORDER — METHYLPHENIDATE HCL 20 MG PO TABS: 20.0000 mg | ORAL_TABLET | Freq: Three times a day (TID) | ORAL | Status: DC

## 2013-04-05 NOTE — Telephone Encounter (Signed)
Patient called requesting a refill on Ritalin.  He would like to pick up Rx when it's ready.  Call back number [phone].

## 2013-04-05 NOTE — Telephone Encounter (Signed)
Pt reports he is or was getting testosterone injections at Dr. Fara Boros office and asks if we can figure out a way to get them less expensive.  He states they are very expensive.  Instructed pt to contact Dr. Fara Boros office regarding the cost of the injections,  There may be assistance available.  Explained we do not prescribe those injections here, so unfortunately cannot assist w/ that issue.  Informed pt of his appt for lab and to see Clenton Pare on 8/18.  Urged him to keep this visit to monitor his anemia.  He verbalized understanding.

## 2013-04-06 NOTE — Telephone Encounter (Signed)
Rx ready for pick up.  I called patient, go no answer.  Left message.

## 2013-04-17 ENCOUNTER — Other Ambulatory Visit: Payer: Self-pay | Admitting: Internal Medicine

## 2013-04-20 ENCOUNTER — Encounter: Payer: Self-pay | Admitting: Internal Medicine

## 2013-04-20 ENCOUNTER — Ambulatory Visit (INDEPENDENT_AMBULATORY_CARE_PROVIDER_SITE_OTHER): Payer: Medicare Other | Admitting: Internal Medicine

## 2013-04-20 VITALS — BP 106/60 | HR 68 | Temp 97.4°F | Resp 18 | Ht 66.0 in | Wt 126.2 lb

## 2013-04-20 DIAGNOSIS — I10 Essential (primary) hypertension: Secondary | ICD-10-CM

## 2013-04-20 DIAGNOSIS — E875 Hyperkalemia: Secondary | ICD-10-CM

## 2013-04-20 DIAGNOSIS — IMO0001 Reserved for inherently not codable concepts without codable children: Secondary | ICD-10-CM

## 2013-04-20 DIAGNOSIS — R634 Abnormal weight loss: Secondary | ICD-10-CM

## 2013-04-20 NOTE — Progress Notes (Signed)
Patient ID: Benjamin Hodge, male   DOB: 11-01-1940, 72 y.o.   MRN: [id number] Location:  [facility]  Code Status: Full code--will discuss this with him next visit   Allergies  Allergen Reactions  . Ace Inhibitors   . Angiotensin Receptor Blockers   . Bee Venom Swelling  . Cymbalta [Duloxetine Hcl]     Chief Complaint  Patient presents with  . Follow-up    HPI: Patient is a 72 y.o. white male seen in the office today for medical mgt of chronic conditions including DMII, IBS, anxiety, anemia and ADHD.   Has lost weight and doesn't know why.  Down to 126.2 lbs right now.  Realized his pants were coming down.  Lost 10 lbs since February.    Has had longstanding anemia.  Did not get bone marrow biopsy when Dr. Gaylyn Rong suggested it.  Wound up getting the testosterone injections with [facility].  Felt like the injections did help his energy and felt better.  Had a sex drive again.  Did quit injections, but cost was too much at target so he decided not to continue.    Diabetes remains grossly uncontrolled.  Has extreme highs as much as 500s--last night was 546 then 561.  Had missed humalog--needle had bent 90 degrees.  Glucose terribly high most of the time lately.  Is off and on with his drinking--goes a few days w/o drinking, then will drink 2-3 drinks another night.  Likes Artist.       Back pain worse than it used to be.  Pain in neck that comes and goes.  Walks with stooped posture.   Review of Systems  Constitutional: Positive for weight loss. Negative for fever and chills.  HENT: Negative for congestion.   Eyes: Negative for blurred vision.  Respiratory: Negative for cough and shortness of breath.   Cardiovascular: Negative for chest pain and leg swelling.  Gastrointestinal: Negative for heartburn, abdominal pain, constipation, blood in stool and melena.  Genitourinary: Negative for dysuria.  Musculoskeletal: Negative for myalgias  and falls.  Skin: Negative for rash.  Neurological: Positive for tingling and sensory change. Negative for dizziness, weakness and headaches.  Endo/Heme/Allergies: Bruises/bleeds easily.  Psychiatric/Behavioral: Negative for depression and memory loss.       Attention deficit    Past Medical History  Diagnosis Date  . Shoulder pain, left   . Malnutrition 11/28/2008  . Right upper lobe pneumonia 11/28/2008  . Anxiety 07/18/2008  . Irritable bowel syndrome 04/27/2008  . Nocturia 03/13/2008  . COPD (chronic obstructive pulmonary disease) 02/27/2008  . B12 deficiency 02/16/2008    in 5/09: B12 262, MMA 1040  . Chronic diarrhea 01/01/2008    s/p EGD 10/06: H pylori + gastritis, duodenal biopsy normal (Dr. Elnoria Howard); s/p colonoscopy 10/06: hemorrhoids (Dr. Elnoria Howard); evaluated by Dr. Yancey Flemings  in 8/09: tissue transglutaminase AB negative, VIP normal, stool fat content normal, urine 5-HIAA normal; normal GES 11/09 (done for "fluctuating sugars")  . 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 Leg pain, right 09/29/2007  . Fatigue 02/28/2007  . 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 Hyperkalemia 02/09/2007  . Orthostatic hypotension 02/09/2007  . Hypertension 02/09/2007  . Spinal stenosis in cervical region 05/03    MRI  . Erectile dysfunction 09/27/2006    s/p penile implant  . Anemia, iron deficiency 09/27/2006    neg colonoscopy 2006 - Dr. Elnoria Howard; ferritin 152; hgb  15.7  on 07/07  . Hypothyroidism 09/27/2006  TSH 2.639  07/07  . Hypersomnia 09/27/2006    evaluated by Dr. Jetty Duhamel (5/08) and Dr. Vickey Huger; PSG 8/09: chronic delayed sleep phase syndrome (patient sleeps during the day and is awake at night), nocturnal myoclonus (eval for RLS and IDA suggested). Pt advised to change sleeping behavior  . Diabetes mellitus type II 09/07/1984    poorly controlled, complicated by peripheral neuropathy, microalbuminuria, mild non proliferative retinopathy, s/p DKA  7/05, on insulin pump x 4/09, started a pump vacation on 11/19/2008  . Hyperlipidemia   . 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 Hemorrhoids   . Chronic kidney disease   . Unspecified hereditary and idiopathic peripheral neuropathy   . Hyperpotassemia   . Other testicular hypofunction   . Impotence of organic origin   . Other chronic pain   . Other malaise and fatigue   . Unspecified hypothyroidism   . Type II or unspecified type diabetes mellitus with neurological manifestations, not stated as uncontrolled(250.60)   . Other B-complex deficiencies   . Other organic hypersomnia   . Type I (juvenile type) diabetes mellitus without mention of complication, not stated as uncontrolled   . Depression   . Attention deficit disorder with hyperactivity(314.01)   . Hypertrophy of prostate without urinary obstruction and other lower urinary tract symptoms (LUTS)   . Lumbago     Past Surgical History  Procedure Laterality Date  . Penile prosthesis implant    . Tonsillectomy      Social History:   reports that he quit smoking about 9 years ago. His smoking use included Cigarettes. He smoked 0.00 packs per day. He has never used smokeless tobacco. He reports that  drinks alcohol. He reports that he does not use illicit drugs.  Family History  Problem Relation Age of Onset  . Bone cancer Mother   . Lung cancer Father   . Breast cancer Sister   . Lung cancer Brother   . Breast cancer Mother   . Cancer Sister     type unknown    Medications: Patient's Medications  New Prescriptions   No medications on file  Previous Medications   ASPIRIN 81 MG TABLET    Take 81 mg by mouth every other day.   DIPHENOXYLATE-ATROPINE (LOMOTIL) 2.5-0.025 MG PER TABLET    Take 1 tablet by mouth 3 (three) times daily as needed for diarrhea or loose stools.   FERROUS SULFATE 325 (65 FE) MG TABLET    TAKE ONE TABLET BY MOUTH THREE TIMES DAILY    GABAPENTIN (NEURONTIN) 100 MG CAPSULE    Take 300 mg by mouth 3 (three)  times daily.    HYDROCHLOROTHIAZIDE (HYDRODIURIL) 25 MG TABLET    TAKE ONE TABLET BY MOUTH EVERY DAY FOR BLOOD PRESSURE   HYDROCODONE-ACETAMINOPHEN (VICODIN) 5-325 MG TABS    Take 1 tablet 5 times a day as needed to control pain   INSULIN LISPRO (HUMALOG KWIKPEN) 100 UNIT/ML SOPN    Inject 8-10 Units into the skin 3 (three) times daily with meals.   INSULIN NPH (HUMULIN N,NOVOLIN N) 100 UNIT/ML INJECTION    Inject 3 Units into the skin at bedtime.   LANTUS SOLOSTAR 100 UNIT/ML INJECTION    Inject into the skin. 8 units in the AM and 3 units at night   LEVOTHYROXINE (SYNTHROID, LEVOTHROID) 50 MCG TABLET    Take 50 mcg by mouth daily.     LEVOTHYROXINE (SYNTHROID, LEVOTHROID) 75 MCG TABLET    TAKE ONE TABLET BY MOUTH ONE TIME DAILY  METHYLPHENIDATE (RITALIN) 20 MG TABLET    Take 1 tablet (20 mg total) by mouth 3 (three) times daily.   METOPROLOL TARTRATE (LOPRESSOR) 25 MG TABLET    Take 1 tablet (25 mg total) by mouth 2 (two) times daily.   ONE TOUCH ULTRA TEST TEST STRIP      Modified Medications   No medications on file  Discontinued Medications   INSULIN LISPRO (HUMALOG) 100 UNIT/ML INJECTION    Inject 8-10 Units into the skin 3 (three) times daily before meals. Takes based on blood sugar results and carb intake     Physical Exam: Filed Vitals:   04/20/13 1555  BP: 106/60  Pulse: 68  Temp: 97.4 F (36.3 C)  TempSrc: Oral  Resp: 18  Height: 5\' 6"  (1.676 m)  Weight: 126 lb 3.2 oz (57.244 kg)  SpO2: 96%  Physical Exam  Constitutional: He is oriented to person, place, and time. No distress.  Thin white male  HENT:  Head: Normocephalic and atraumatic.  Neck: Normal range of motion. Neck supple.  Cardiovascular: Normal rate, regular rhythm, normal heart sounds and intact distal pulses.   Pulmonary/Chest: Effort normal and breath sounds normal. No respiratory distress.  Abdominal: Soft. Bowel sounds are normal. He exhibits no distension and no mass. There is no tenderness.   Musculoskeletal: Normal range of motion. He exhibits no edema and no tenderness.  Lymphadenopathy:    He has no cervical adenopathy.  Neurological: He is alert and oriented to person, place, and time. He has normal reflexes. No cranial nerve deficit.  Skin: Skin is warm and dry.  Psychiatric:  Anxious, difficulty focusing on topic at hand, fidgety    Labs reviewed: Basic Metabolic Panel:  Recent Labs  16/10/96 1507 01/18/13 1701  NA 142 144  K 4.9 5.2  CL 108* 111*  CO2 27 25  GLUCOSE 256* 87  BUN 12.2 17  CREATININE 1.1 1.05  CALCIUM 8.7 8.9   Liver Function Tests:  Recent Labs  11/07/12 1507 01/18/13 1701  AST 57* 20  ALT 41 19  ALKPHOS 61 55  BILITOT 0.45 0.3  PROT 6.4 5.8*  ALBUMIN 3.5  --   CBC:  Recent Labs  05/12/12 1535 08/09/12 1516 11/07/12 1507 01/18/13 1701  WBC 5.6 5.6 6.7 6.0  NEUTROABS 3.7 3.6 4.5 3.9  HGB 9.7* 9.1* 10.5* 10.9*  HCT 29.2* 27.3* 31.2* 33.1*  MCV 106.1* 106.5* 104.7* 100*  PLT 207 205 208  --     Lab Results  Component Value Date   HGBA1C 6.3* 01/18/2013   Lab Results  Component Value Date   TSH 2.401 08/22/2009   Assessment/Plan 1. Type II or unspecified type diabetes mellitus without mention of complication, uncontrolled --also sees Dr. Lucianne Muss who is also frustrated with his glucose control --just got new constant CBG machine ordered by Dr. Mechele Dawley not taken out of the box--discussed he needs to get this working --f/u hba1c today --this may be why he is losing weight  2. Unspecified essential hypertension --at goal  3. Loss of weight --may be due to his uncontrolled diabetes --I am concerned about malignancy as he has now lost significant weight in a short period of time --maybe MDS--has not had the bone marrow biopsy Dr. Gaylyn Rong previously recommended --sees Dr. Gaylyn Rong on Monday  Labs/tests ordered:  Tsh, cbc, cmp, hba1c Next appt:  3 mos

## 2013-04-20 NOTE — Patient Instructions (Addendum)
See what Dr. Gaylyn Rong says about your anemia now.  I drew the labs he ordered today.

## 2013-04-21 ENCOUNTER — Other Ambulatory Visit: Payer: Self-pay | Admitting: *Deleted

## 2013-04-21 DIAGNOSIS — E875 Hyperkalemia: Secondary | ICD-10-CM

## 2013-04-21 MED ORDER — SODIUM POLYSTYRENE SULFONATE 15 GM/60ML PO SUSP: 15.0000 g | Freq: Once | ORAL | Status: DC

## 2013-04-21 MED ORDER — INSULIN PEN NEEDLE 32G X 4 MM MISC: 1.0000 | Freq: Three times a day (TID) | Status: DC

## 2013-04-22 LAB — COMPREHENSIVE METABOLIC PANEL
ALT: 40 IU/L (ref 0–44)
AST: 41 IU/L — ABNORMAL HIGH (ref 0–40)
Albumin/Globulin Ratio: 1.9 (ref 1.1–2.5)
Albumin: 4.4 g/dL (ref 3.5–4.8)
Alkaline Phosphatase: 78 IU/L (ref 39–117)
BUN/Creatinine Ratio: 21 (ref 10–22)
BUN: 23 mg/dL (ref 8–27)
CO2: 24 mmol/L (ref 18–29)
Calcium: 9.6 mg/dL (ref 8.6–10.2)
Chloride: 103 mmol/L (ref 97–108)
Creatinine, Ser: 1.12 mg/dL (ref 0.76–1.27)
GFR calc Af Amer: 77 mL/min/{1.73_m2} (ref >59)
GFR calc non Af Amer: 66 mL/min/{1.73_m2} (ref >59)
Globulin, Total: 2.3 g/dL (ref 1.5–4.5)
Glucose: 292 mg/dL — ABNORMAL HIGH (ref 65–99)
Potassium: 6.7 mmol/L (ref 3.5–5.2)
Sodium: 141 mmol/L (ref 134–144)
Total Bilirubin: 0.4 mg/dL (ref 0.0–1.2)
Total Protein: 6.7 g/dL (ref 6.0–8.5)

## 2013-04-22 LAB — CBC WITH DIFFERENTIAL/PLATELET
Basophils Absolute: 0 10*3/uL (ref 0.0–0.2)
Basos: 0 % (ref 0–3)
Eos: 2 % (ref 0–5)
Eosinophils Absolute: 0.2 10*3/uL (ref 0.0–0.4)
HCT: 35.7 % — ABNORMAL LOW (ref 37.5–51.0)
Hemoglobin: 11.6 g/dL — ABNORMAL LOW (ref 12.6–17.7)
Immature Grans (Abs): 0 10*3/uL (ref 0.0–0.1)
Immature Granulocytes: 0 % (ref 0–2)
Lymphocytes Absolute: 1.1 10*3/uL (ref 0.7–3.1)
Lymphs: 16 % (ref 14–46)
MCH: 32.5 pg (ref 26.6–33.0)
MCHC: 32.5 g/dL (ref 31.5–35.7)
MCV: 100 fL — ABNORMAL HIGH (ref 79–97)
Monocytes Absolute: 0.7 10*3/uL (ref 0.1–0.9)
Monocytes: 11 % (ref 4–12)
Neutrophils Absolute: 4.8 10*3/uL (ref 1.4–7.0)
Neutrophils Relative %: 71 % (ref 40–74)
RBC: 3.57 x10E6/uL — ABNORMAL LOW (ref 4.14–5.80)
RDW: 12.4 % (ref 12.3–15.4)
WBC: 6.8 10*3/uL (ref 3.4–10.8)

## 2013-04-22 LAB — HEMOGLOBIN A1C
Est. average glucose Bld gHb Est-mCnc: 223 mg/dL
Hgb A1c MFr Bld: 9.4 % — ABNORMAL HIGH (ref 4.8–5.6)

## 2013-04-22 LAB — TSH: TSH: 1.46 u[IU]/mL (ref 0.450–4.500)

## 2013-04-24 ENCOUNTER — Ambulatory Visit (HOSPITAL_BASED_OUTPATIENT_CLINIC_OR_DEPARTMENT_OTHER): Payer: Medicare Other | Admitting: Oncology

## 2013-04-24 ENCOUNTER — Encounter: Payer: Self-pay | Admitting: Oncology

## 2013-04-24 ENCOUNTER — Telehealth: Payer: Self-pay | Admitting: *Deleted

## 2013-04-24 ENCOUNTER — Other Ambulatory Visit (HOSPITAL_BASED_OUTPATIENT_CLINIC_OR_DEPARTMENT_OTHER): Payer: Medicare Other

## 2013-04-24 VITALS — BP 132/63 | HR 71 | Temp 97.2°F | Resp 18 | Ht 66.0 in | Wt 129.8 lb

## 2013-04-24 DIAGNOSIS — D649 Anemia, unspecified: Secondary | ICD-10-CM

## 2013-04-24 DIAGNOSIS — R5381 Other malaise: Secondary | ICD-10-CM

## 2013-04-24 DIAGNOSIS — E039 Hypothyroidism, unspecified: Secondary | ICD-10-CM

## 2013-04-24 DIAGNOSIS — Z862 Personal history of diseases of the blood and blood-forming organs and certain disorders involving the immune mechanism: Secondary | ICD-10-CM

## 2013-04-24 DIAGNOSIS — D61818 Other pancytopenia: Secondary | ICD-10-CM

## 2013-04-24 DIAGNOSIS — E119 Type 2 diabetes mellitus without complications: Secondary | ICD-10-CM

## 2013-04-24 DIAGNOSIS — E875 Hyperkalemia: Secondary | ICD-10-CM

## 2013-04-24 LAB — CBC WITH DIFFERENTIAL/PLATELET
Basophils Absolute: 0.1 10*3/uL (ref 0.0–0.1)
EOS%: 3.8 % (ref 0.0–7.0)
HGB: 10.7 g/dL — ABNORMAL LOW (ref 13.0–17.1)
MCH: 33.3 pg (ref 27.2–33.4)
MONO#: 0.7 10*3/uL (ref 0.1–0.9)
NEUT#: 5.2 10*3/uL (ref 1.5–6.5)
RDW: 12.8 % (ref 11.0–14.6)
WBC: 7.4 10*3/uL (ref 4.0–10.3)
lymph#: 1.2 10*3/uL (ref 0.9–3.3)

## 2013-04-24 NOTE — Telephone Encounter (Signed)
appts made and printed...td 

## 2013-04-24 NOTE — Progress Notes (Signed)
[facility]  Telephone:([phone] Fax:([phone]) [phone]   OFFICE PROGRESS NOTE   DIAGNOSIS: DIAGNOSIS: normocytic anemia; most likely hypogonadism and hypothyroidism.   CURRENT THERAPY: treatment of underlying diseases.    INTERVAL HISTORY: Benjamin Hodge 72 y.o. male returns for regular follow up by himself.  Patient reports that he stopped his testosterone injections about 8 weeks ago due to cost. He reports more fatigue since stopping. He has lost some weight, but reports a good appetite.  Denies fevers, chills, night sweats. No bleeding. No chest pain, shortness of breath, dyspnea. He reports diarrhea, but develops constipation when he takes Lomotil. He is still working part time as a Training and development officer. Had labs last week per PCP and reports that he had an elevated K+ level. He was given one dose of Kayexalate. He was instructed to have a repeat K+ level today at our office.  Patient denies headache, visual changes, confusion, drenching night sweats, palpable lymph node swelling, mucositis, odynophagia, dysphagia, nausea vomiting, jaundice, chest pain, palpitation, shortness of breath, dyspnea on exertion, productive cough, gum bleeding, epistaxis, hematemesis, hemoptysis, abdominal pain, abdominal swelling, early satiety, melena, hematochezia, hematuria, skin rash, spontaneous bleeding, joint swelling, joint pain, heat or cold intolerance, bowel bladder incontinence, back pain, focal motor weakness, paresthesia, depression, suicidal or homocidal ideation, feeling hopelessness.    Past Medical History  Diagnosis Date  . Shoulder pain, left   . Malnutrition 11/28/2008  . Right upper lobe pneumonia 11/28/2008  . Anxiety 07/18/2008  . Irritable bowel syndrome 04/27/2008  . Nocturia 03/13/2008  . COPD (chronic obstructive pulmonary disease) 02/27/2008  . B12 deficiency 02/16/2008    in 5/09: B12 262, MMA 1040  . Chronic diarrhea 01/01/2008    s/p EGD 10/06: H pylori +  gastritis, duodenal biopsy normal (Dr. Elnoria Howard); s/p colonoscopy 10/06: hemorrhoids (Dr. Elnoria Howard); evaluated by Dr. Yancey Flemings  in 8/09: tissue transglutaminase AB negative, VIP normal, stool fat content normal, urine 5-HIAA normal; normal GES 11/09 (done for "fluctuating sugars")  . 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 Leg pain, right 09/29/2007  . Fatigue 02/28/2007  . 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 Hyperkalemia 02/09/2007  . Orthostatic hypotension 02/09/2007  . Hypertension 02/09/2007  . Spinal stenosis in cervical region 05/03    MRI  . Erectile dysfunction 09/27/2006    s/p penile implant  . Anemia, iron deficiency 09/27/2006    neg colonoscopy 2006 - Dr. Elnoria Howard; ferritin 152; hgb  15.7  on 07/07  . Hypothyroidism 09/27/2006    TSH 2.639  07/07  . Hypersomnia 09/27/2006    evaluated by Dr. Jetty Duhamel (5/08) and Dr. Vickey Huger; PSG 8/09: chronic delayed sleep phase syndrome (patient sleeps during the day and is awake at night), nocturnal myoclonus (eval for RLS and IDA suggested). Pt advised to change sleeping behavior  . Diabetes mellitus type II 09/07/1984    poorly controlled, complicated by peripheral neuropathy, microalbuminuria, mild non proliferative retinopathy, s/p DKA 7/05, on insulin pump x 4/09, started a pump vacation on 11/19/2008  . Hyperlipidemia   . 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 Hemorrhoids   . Chronic kidney disease   . Unspecified hereditary and idiopathic peripheral neuropathy   . Hyperpotassemia   . Other testicular hypofunction   . Impotence of organic origin   . Other chronic pain   . Other malaise and fatigue   . Unspecified hypothyroidism   . Type II or unspecified type diabetes mellitus with neurological manifestations, not stated as uncontrolled(250.60)   .  Other B-complex deficiencies   . Other organic hypersomnia   . Type I (juvenile type) diabetes mellitus without mention of complication, not stated as  uncontrolled   . Depression   . Attention deficit disorder with hyperactivity(314.01)   . Hypertrophy of prostate without urinary obstruction and other lower urinary tract symptoms (LUTS)   . Lumbago     Past Surgical History  Procedure Laterality Date  . Penile prosthesis implant    . Tonsillectomy      Current Outpatient Prescriptions  Medication Sig Dispense Refill  . aspirin 81 MG tablet Take 81 mg by mouth every other day.      . diphenoxylate-atropine (LOMOTIL) 2.5-0.025 MG per tablet Take 1 tablet by mouth 3 (three) times daily as needed for diarrhea or loose stools.  90 tablet  3  . ferrous sulfate 325 (65 FE) MG tablet TAKE ONE TABLET BY MOUTH THREE TIMES DAILY   90 tablet  3  . gabapentin (NEURONTIN) 100 MG capsule Take 300 mg by mouth 3 (three) times daily.       . hydrochlorothiazide (HYDRODIURIL) 25 MG tablet TAKE ONE TABLET BY MOUTH EVERY DAY FOR BLOOD PRESSURE  30 tablet  0  . HYDROcodone-acetaminophen (VICODIN) 5-325 mg TABS Take 1 tablet 5 times a day as needed to control pain  150 tablet  5  . insulin lispro (HUMALOG KWIKPEN) 100 UNIT/ML SOPN Inject 8-10 Units into the skin 3 (three) times daily with meals.  3 pen  6  . insulin NPH (HUMULIN N,NOVOLIN N) 100 UNIT/ML injection Inject 3 Units into the skin at bedtime.      . Insulin Pen Needle (BD PEN NEEDLE NANO U/F) 32G X 4 MM MISC 1 each by Does not apply route 3 (three) times daily.  100 each  5  . LANTUS SOLOSTAR 100 UNIT/ML injection Inject into the skin. 8 units in the AM and 3 units at night      . levothyroxine (SYNTHROID, LEVOTHROID) 75 MCG tablet TAKE ONE TABLET BY MOUTH ONE TIME DAILY  30 tablet  0  . methylphenidate (RITALIN) 20 MG tablet Take 1 tablet (20 mg total) by mouth 3 (three) times daily.  90 tablet  0  . metoprolol tartrate (LOPRESSOR) 25 MG tablet Take 1 tablet (25 mg total) by mouth 2 (two) times daily.  60 tablet  5  . ONE TOUCH ULTRA TEST test strip       . sodium polystyrene (KAYEXALATE) 15  GM/60ML suspension Take 60 mL (15 g total) by mouth once.  500 mL  0   No current facility-administered medications for this visit.    ALLERGIES:  is allergic to ace inhibitors; angiotensin receptor blockers; bee venom; and cymbalta.  REVIEW OF SYSTEMS:  The rest of the 14-point review of system was negative.   Filed Vitals:   04/24/13 1529  BP: 132/63  Pulse: 71  Temp: 97.2 F (36.2 C)  Resp: 18   Wt Readings from Last 3 Encounters:  04/24/13 129 lb 12.8 oz (58.877 kg)  04/20/13 126 lb 3.2 oz (57.244 kg)  01/23/13 134 lb (60.782 kg)   ECOG Performance status: 1  PHYSICAL EXAMINATION:  General:  Thin-appearing man,  in no acute distress.  Eyes:  no scleral icterus.  ENT:  There were no oropharyngeal lesions.  Neck was without thyromegaly.  Lymphatics:  Negative cervical, supraclavicular or axillary adenopathy.  Respiratory: lungs were clear bilaterally without wheezing or crackles.  Cardiovascular:  Regular rate and rhythm, S1/S2, without  murmur, rub or gallop.  There was no pedal edema.  GI:  abdomen was soft, flat, nontender, nondistended, without organomegaly.  Muscoloskeletal:  no spinal tenderness of palpation of vertebral spine.  Skin exam was without echymosis, petichae.  Neuro exam was nonfocal.  Patient was able to get on and off exam table without assistance.  Gait was normal.  Patient was alerted and oriented.  Attention was good.   Language was appropriate.  Mood was normal without depression.  Speech was not pressured.  Thought content was not tangential.     LABORATORY/RADIOLOGY DATA:  Lab Results  Component Value Date   WBC 7.4 04/24/2013   HGB 10.7* 04/24/2013   HCT 31.5* 04/24/2013   PLT 204 04/24/2013   GLUCOSE 292* 04/20/2013   CHOL  Value: 122        ATP III CLASSIFICATION:  <200     mg/dL   Desirable  161-096  mg/dL   Borderline High  >=045    mg/dL   High        12/14/8117   TRIG 96 11/26/2008   HDL 25* 11/26/2008   LDLCALC  Value: 78        Total  Cholesterol/HDL:CHD Risk Coronary Heart Disease Risk Table                     Men   Women  1/2 Average Risk   3.4   3.3  Average Risk       5.0   4.4  2 X Average Risk   9.6   7.1  3 X Average Risk  23.4   11.0        Use the calculated Patient Ratio above and the CHD Risk Table to determine the patient's CHD Risk.        ATP III CLASSIFICATION (LDL):  <100     mg/dL   Optimal  147-829  mg/dL   Near or Above                    Optimal  130-159  mg/dL   Borderline  562-130  mg/dL   High  >865     mg/dL   Very High 7/84/6962   ALKPHOS 78 04/20/2013   ALT 40 04/20/2013   AST 41* 04/20/2013   NA 141 04/20/2013   K 6.7* 04/20/2013   CL 103 04/20/2013   CREATININE 1.12 04/20/2013   BUN 23 04/20/2013   CO2 24 04/20/2013   PSA 0.30 03/04/2009   INR 0.89 08/06/2010   HGBA1C 9.4* 04/20/2013   MICROALBUR 1.35 09/17/2008    ASSESSMENT AND PLAN:   1. Normocytic now macrocytic anemia: Extensive workup in the past was negative except for hopogonadism.  Past work up for VitB12 and folate were negative.  He has hypogonadism and hypothyroidism which can account for the anemia.  I do not have a recent testosterone level on him. Anemia has slightly worsened at this time. I cannot rule out MDS; however, it is still not very high on my list of differential due to mild chronic anemia and not pancytopenia.  I have encouraged him to talk with [facility] about restarting testosterone supplementation. In the future, if despite repletion of testosterone, and he is still severely anemic, then a daignostic bone marrow biopsy may be considered at that time. He is due for a repeat colonoscopy in 2016.  2. Hypogonadisim in addition to hypothyroidism:  I deferred to Dr. Renato Gails and Dr. Lucianne Muss  to see if he needs work up to rule out panhypopituitarism.    3. Fatigue: query if due to hypogonadism. More fatigue since stopping testosterone. I have encouraged him to resume this.   4. HTN: Good control of BP with HCTZ per PCP.   5.  Hypothyroidism: on synthroid per PCP.   6. DM: Last HgbA1C was 9.4, I have encouraged him to follow-up with Endocrinology.  7. IBS: Followed by Dr Marina Goodell. He has been given Lomotil. He has asked about further management of his IBS and I have encouraged him to talk with Dr Marina Goodell about this.  8. Hyperkalemia. Received Kayexalate last week for K+ > 6. K+ level is pending today. Will forward to Dr Renato Gails once available.  9.  Follow up: lab only in 3 months.  Return visit in about 6 months.     The length of time of the face-to-face encounter was 30 minutes. More than 50% of time was spent counseling and coordination of care.

## 2013-04-25 ENCOUNTER — Other Ambulatory Visit: Payer: Self-pay | Admitting: Internal Medicine

## 2013-04-27 ENCOUNTER — Other Ambulatory Visit: Payer: Self-pay | Admitting: Internal Medicine

## 2013-05-02 ENCOUNTER — Telehealth: Payer: Self-pay | Admitting: *Deleted

## 2013-05-02 NOTE — Telephone Encounter (Signed)
Patient called and stated that he called last week and said he needed BW done and that his potassium was high and was told by Amy she didn't know anything about it and didn't see anything in his chart. He stated that he was concerned about this. I told patient it is there and it was stated on his bloodwork. Patient was ok and said he would come in this week for a redraw.

## 2013-05-02 NOTE — Telephone Encounter (Signed)
Pt left VM to ask about his potassium level from office visit last week.  Left VM for pt informing we did not check his Potassium level.  Instructed him to f/u w/ Dr. Renato Gails as she is managing his potassium.  Asked him to call back if any further questions/concerns.

## 2013-05-03 ENCOUNTER — Other Ambulatory Visit: Payer: Medicare Other

## 2013-05-03 DIAGNOSIS — E875 Hyperkalemia: Secondary | ICD-10-CM

## 2013-05-04 LAB — BASIC METABOLIC PANEL
BUN/Creatinine Ratio: 21 (ref 10–22)
BUN: 19 mg/dL (ref 8–27)
CO2: 25 mmol/L (ref 18–29)
Calcium: 8.9 mg/dL (ref 8.6–10.2)
Chloride: 104 mmol/L (ref 97–108)
Creatinine, Ser: 0.91 mg/dL (ref 0.76–1.27)
GFR calc Af Amer: 98 mL/min/{1.73_m2} (ref >59)
GFR calc non Af Amer: 85 mL/min/{1.73_m2} (ref >59)
Glucose: 237 mg/dL — ABNORMAL HIGH (ref 65–99)
Potassium: 5.8 mmol/L — ABNORMAL HIGH (ref 3.5–5.2)
Sodium: 140 mmol/L (ref 134–144)

## 2013-05-09 ENCOUNTER — Other Ambulatory Visit: Payer: Self-pay | Admitting: Internal Medicine

## 2013-05-10 ENCOUNTER — Ambulatory Visit: Payer: Medicare Other | Admitting: Oncology

## 2013-05-10 ENCOUNTER — Other Ambulatory Visit: Payer: Medicare Other | Admitting: Lab

## 2013-05-11 ENCOUNTER — Other Ambulatory Visit: Payer: Self-pay | Admitting: *Deleted

## 2013-05-11 MED ORDER — HYDROCODONE-ACETAMINOPHEN 5-325MG PREPACK (~~LOC~~: ORAL_TABLET | ORAL | Status: DC

## 2013-05-22 ENCOUNTER — Encounter: Payer: Self-pay | Admitting: Endocrinology

## 2013-05-22 ENCOUNTER — Ambulatory Visit (INDEPENDENT_AMBULATORY_CARE_PROVIDER_SITE_OTHER): Payer: Medicare Other | Admitting: Endocrinology

## 2013-05-22 VITALS — BP 122/60 | HR 70 | Temp 98.4°F | Resp 10 | Ht 65.0 in | Wt 130.8 lb

## 2013-05-22 DIAGNOSIS — E1065 Type 1 diabetes mellitus with hyperglycemia: Secondary | ICD-10-CM

## 2013-05-22 DIAGNOSIS — E039 Hypothyroidism, unspecified: Secondary | ICD-10-CM

## 2013-05-22 DIAGNOSIS — E875 Hyperkalemia: Secondary | ICD-10-CM

## 2013-05-22 DIAGNOSIS — E291 Testicular hypofunction: Secondary | ICD-10-CM

## 2013-05-22 NOTE — Patient Instructions (Addendum)
Take 2 units for alcoholic drinks  More sugars 2 hrs after bfst and any drinks  Carb coverage 1:15 at supper

## 2013-05-22 NOTE — Progress Notes (Addendum)
Patient ID: Benjamin Hodge, male   DOB: 08/03/1941, 72 y.o.   MRN: [id number]  Reason for Appointment : Follow up for Type 1 Diabetes  History of Present Illness          Diagnosis: Type 1 diabetes mellitus, date of diagnosis: 1986         Past history: Patient has had very labile diabetes and generally difficult to control for several years. He had previously been tried on an Accu-Chek pump also which did not appear to be unofficial and he was not very keen on continuing Has been tried on basal bolus insulin regimen for several years. His insulin has been changed continuously without achieving good control. Has been tried on Levemir instead of Lantus without any significant benefit. Also because of tendency to markedly increased blood sugars overnight he has been started on NPH insulin at bedtime along with his twice a day Lantus  INSULIN regimen is described as: Lantus; 3 units at 2 am and 7 units at 2 pm.  Humulin 4 units at bedtime, Humalog 1:10 carbohydrate coverage  Recent history:  His blood sugars are again showing the same patterns of marked increase late at night and overnight and variable readings on waking up. Also has relatively good readings later in the day. His median glucose around supper time is 351 with some readings after eating He is also having a unusual routine of sleep and wake cycles as well as mealtimes with eating supper around 1 AM and waking up around 1-2 PM. He is generally not eating a lunch and is mostly having some snacks Also he is frequently having martinis in the evening which is not covered with insulin He was prescribed a continuous glucose monitor but he does not know how to use it and has not been instructed on this   Glucose monitoring:  done times a day         Glucometer: One Touch.      Blood Glucose readings from meter download: readings before breakfast: 95 -374  with median about 260; between 7 PM-midnight 95-474, between midnight-6 AM 138-523  with median 351, not clear which readings are postprandial, blood sugars before 2 AM are relatively better      Hypoglycemia:  none recently with lowest blood sugar 71. Occasionally has had severe hypoglycemia but usually is able to treat himself Factors causing hyperglycemia: Probably not covering snacks and alcohol in the late evenings; eating relatively high fat meal at night and not taking enough mealtime coverage     Self-care: The diet that the patient has been following is: Usually eating frozen meal at dinnertime  Meals: 3 meals per day.      Physical activity: exercise: No programmed activity.            Lab Results  Component Value Date   HGBA1C 9.4* 04/20/2013    Lab Results  Component Value Date   MICROALBUR 1.35 09/17/2008      Medication List       This list is accurate as of: 05/22/13 11:59 PM.  Always use your most recent med list.               aspirin 81 MG tablet  Take 81 mg by mouth every other day.     diphenoxylate-atropine 2.5-0.025 MG per tablet  Commonly known as:  LOMOTIL  Take 1 tablet by mouth 3 (three) times daily as needed for diarrhea or loose stools.     ferrous  sulfate 325 (65 FE) MG tablet  TAKE ONE TABLET BY MOUTH THREE TIMES DAILY     gabapentin 100 MG capsule  Commonly known as:  NEURONTIN  Take 300 mg by mouth 3 (three) times daily.     hydrochlorothiazide 25 MG tablet  Commonly known as:  HYDRODIURIL  TAKE ONE TABLET BY MOUTH EVERY DAY FOR BLOOD PRESSURE     HYDROcodone-acetaminophen 5-325 mg Tabs tablet  Commonly known as:  VICODIN  Take 1 tablet 5 times a day as needed to control pain     insulin lispro 100 UNIT/ML Sopn  Commonly known as:  HUMALOG KWIKPEN  Inject 8-10 Units into the skin 3 (three) times daily with meals.     insulin NPH 100 UNIT/ML injection  Commonly known as:  HUMULIN N,NOVOLIN N  Inject 3 Units into the skin at bedtime.     Insulin Pen Needle 32G X 4 MM Misc  Commonly known as:  BD PEN NEEDLE NANO  U/F  1 each by Does not apply route 3 (three) times daily.     LANTUS SOLOSTAR 100 UNIT/ML Sopn  Generic drug:  Insulin Glargine  Inject into the skin. 7 units in the AM and 3 units at night     levothyroxine 75 MCG tablet  Commonly known as:  SYNTHROID, LEVOTHROID  TAKE ONE TABLET BY MOUTH ONE TIME DAILY     methylphenidate 20 MG tablet  Commonly known as:  RITALIN  Take 1 tablet (20 mg total) by mouth 3 (three) times daily.     metoprolol tartrate 25 MG tablet  Commonly known as:  LOPRESSOR  Take 1 tablet (25 mg total) by mouth 2 (two) times daily.     ONE TOUCH ULTRA TEST test strip  Generic drug:  glucose blood     sodium polystyrene 15 GM/60ML suspension  Commonly known as:  KAYEXALATE  Take 60 mL (15 g total) by mouth once.     testosterone cypionate 200 MG/ML injection  Commonly known as:  DEPOTESTOTERONE CYPIONATE        Allergies:  Allergies  Allergen Reactions  . Ace Inhibitors   . Angiotensin Receptor Blockers   . Bee Venom Swelling  . Cymbalta [Duloxetine Hcl]     Past Medical History  Diagnosis Date  . Shoulder pain, left   . Malnutrition 11/28/2008  . Right upper lobe pneumonia 11/28/2008  . Anxiety 07/18/2008  . Irritable bowel syndrome 04/27/2008  . Nocturia 03/13/2008  . COPD (chronic obstructive pulmonary disease) 02/27/2008  . B12 deficiency 02/16/2008    in 5/09: B12 262, MMA 1040  . Chronic diarrhea 01/01/2008    s/p EGD 10/06: H pylori + gastritis, duodenal biopsy normal (Dr. Elnoria Howard); s/p colonoscopy 10/06: hemorrhoids (Dr. Elnoria Howard); evaluated by Dr. Yancey Flemings  in 8/09: tissue transglutaminase AB negative, VIP normal, stool fat content normal, urine 5-HIAA normal; normal GES 11/09 (done for "fluctuating sugars")  . 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 Leg pain, right 09/29/2007  . Fatigue 02/28/2007  . 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 Hyperkalemia 02/09/2007  . Orthostatic hypotension 02/09/2007   . Hypertension 02/09/2007  . Spinal stenosis in cervical region 05/03    MRI  . Erectile dysfunction 09/27/2006    s/p penile implant  . Anemia, iron deficiency 09/27/2006    neg colonoscopy 2006 - Dr. Elnoria Howard; ferritin 152; hgb  15.7  on 07/07  . Hypothyroidism 09/27/2006    TSH 2.639  07/07  . Hypersomnia 09/27/2006  evaluated by Dr. Jetty Duhamel (5/08) and Dr. Vickey Huger; PSG 8/09: chronic delayed sleep phase syndrome (patient sleeps during the day and is awake at night), nocturnal myoclonus (eval for RLS and IDA suggested). Pt advised to change sleeping behavior  . Diabetes mellitus type II 09/07/1984    poorly controlled, complicated by peripheral neuropathy, microalbuminuria, mild non proliferative retinopathy, s/p DKA 7/05, on insulin pump x 4/09, started a pump vacation on 11/19/2008  . Hyperlipidemia   . 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 Hemorrhoids   . Chronic kidney disease   . Unspecified hereditary and idiopathic peripheral neuropathy   . Hyperpotassemia   . Other testicular hypofunction   . Impotence of organic origin   . Other chronic pain   . Other malaise and fatigue   . Unspecified hypothyroidism   . Type II or unspecified type diabetes mellitus with neurological manifestations, not stated as uncontrolled(250.60)   . Other B-complex deficiencies   . Other organic hypersomnia   . Type I (juvenile type) diabetes mellitus without mention of complication, not stated as uncontrolled   . Depression   . Attention deficit disorder with hyperactivity(314.01)   . Hypertrophy of prostate without urinary obstruction and other lower urinary tract symptoms (LUTS)   . Lumbago     Past Surgical History  Procedure Laterality Date  . Penile prosthesis implant    . Tonsillectomy      Family History  Problem Relation Age of Onset  . Bone cancer Mother   . Lung cancer Father   . Breast cancer Sister   . Lung cancer Brother   . Breast cancer Mother   . Cancer Sister     type  unknown    Social History:  reports that he quit smoking about 9 years ago. His smoking use included Cigarettes. He smoked 0.00 packs per day. He has never used smokeless tobacco. He reports that  drinks alcohol. He reports that he does not use illicit drugs.    Review of Systems:   ANEMIA: His hemoglobin is recently better at 11.6, has been followed by hematologist  He has been diagnosed to have hypogonadism and is now being treated by his urologist with injections every 2 weeks. Last injection was about 2 weeks ago He has had mild hypertension History of chronic diarrhea of unclear etiology Has had BPH HYPERKALEMIA: Potassium was high but not scheduled for repeat testing No pedal edema    Physical Examination:  BP 122/60  Pulse 70  Temp(Src) 98.4 F (36.9 C)  Resp 10  Ht 5\' 5"  (1.651 m)  Wt 130 lb 12.8 oz (59.33 kg)  BMI 21.77 kg/m2  SpO2 97%         ASSESSMENT:  Diabetes type 1:   His blood sugars are again showing the same patterns of marked increase late at night and overnight and variable readings on waking up.  This is probably related to inconsistent insulin and action as well as snacks and alcohol drinks in the evenings which is not covered by insulin Also not checking blood sugars much later in the day but only right at suppertime He does have significant variability in his blood sugars around supper time and some of them are fairly good probably related to variable snacks and alcohol intake. He most likely has very high readings after his evening meal which is also a relatively high fat. He generally is reluctant to take more insulin to cover his evening meal for fear of overnight hypoglycemia  Has had  some improvement in overnight readings less hypoglycemia with combining NPH and Lantus However fasting readings are quite variable and still sometimes high Also has relatively good readings later in the day. His A1c is much higher than usual, previously falsely low  because of anemia  HYPOGONADISM: Managed by urologist with injections now Hypothyroidism: To have TSH checked again   PLAN:   Increase coverage for supper time and use 1:15 carbohydrate coverage Will modify insulin further after he starts continuous glucose monitoring which will help with identifying trends He will need to cover any alcohol intake in the evening with Humalog, at least 2-3 units Reconsider insulin pump control not improved  Assess testosterone levels and forward results to urologist   [facility] 05/25/2013, 10:16 AM   Addendum: Potassium mildly increased, advised him to follow a low potassium diet and followup with PCP  Office Visit on 05/22/2013  Component Date Value Range Status  . Sodium 05/22/2013 138  135 - 145 mEq/L Final  . Potassium 05/22/2013 5.5* 3.5 - 5.1 mEq/L Final  . Chloride 05/22/2013 105  96 - 112 mEq/L Final  . CO2 05/22/2013 28  19 - 32 mEq/L Final  . Glucose, Bld 05/22/2013 396* 70 - 99 mg/dL Final  . BUN 16/06/9603 21  6 - 23 mg/dL Final  . Creatinine, Ser 05/22/2013 1.4  0.4 - 1.5 mg/dL Final  . Total Bilirubin 05/22/2013 0.6  0.3 - 1.2 mg/dL Final  . Alkaline Phosphatase 05/22/2013 56  39 - 117 U/L Final  . AST 05/22/2013 25  0 - 37 U/L Final  . ALT 05/22/2013 21  0 - 53 U/L Final  . Total Protein 05/22/2013 6.2  6.0 - 8.3 g/dL Final  . Albumin 54/05/8118 3.7  3.5 - 5.2 g/dL Final  . Calcium 14/78/2956 8.3* 8.4 - 10.5 mg/dL Final  . GFR 21/30/8657 54.55* >60.00 mL/min Final  . Free T4 05/22/2013 0.93  0.60 - 1.60 ng/dL Final  . TSH 84/69/6295 1.03  0.35 - 5.50 uIU/mL Final  . Testosterone 05/22/2013 414.87  350.00 - 890.00 ng/dL Final

## 2013-05-23 LAB — COMPREHENSIVE METABOLIC PANEL
ALT: 21 U/L (ref 0–53)
AST: 25 U/L (ref 0–37)
Albumin: 3.7 g/dL (ref 3.5–5.2)
Alkaline Phosphatase: 56 U/L (ref 39–117)
Glucose, Bld: 396 mg/dL — ABNORMAL HIGH (ref 70–99)
Potassium: 5.5 mEq/L — ABNORMAL HIGH (ref 3.5–5.1)
Sodium: 138 mEq/L (ref 135–145)
Total Bilirubin: 0.6 mg/dL (ref 0.3–1.2)
Total Protein: 6.2 g/dL (ref 6.0–8.3)

## 2013-05-31 ENCOUNTER — Encounter: Payer: Self-pay | Admitting: Neurology

## 2013-05-31 ENCOUNTER — Ambulatory Visit (INDEPENDENT_AMBULATORY_CARE_PROVIDER_SITE_OTHER): Payer: Medicare Other | Admitting: Neurology

## 2013-05-31 VITALS — BP 174/72 | HR 66 | Resp 17 | Ht 65.0 in | Wt 133.0 lb

## 2013-05-31 DIAGNOSIS — F901 Attention-deficit hyperactivity disorder, predominantly hyperactive type: Secondary | ICD-10-CM

## 2013-05-31 DIAGNOSIS — G4710 Hypersomnia, unspecified: Secondary | ICD-10-CM

## 2013-05-31 DIAGNOSIS — F909 Attention-deficit hyperactivity disorder, unspecified type: Secondary | ICD-10-CM

## 2013-05-31 DIAGNOSIS — F908 Attention-deficit hyperactivity disorder, other type: Secondary | ICD-10-CM

## 2013-05-31 MED ORDER — METHYLPHENIDATE HCL 20 MG PO TABS: 20.0000 mg | ORAL_TABLET | Freq: Two times a day (BID) | ORAL | Status: DC

## 2013-05-31 NOTE — Progress Notes (Signed)
[facility]  Provider:  Melvyn Novas, M D  Referring Provider: Kermit Balo, DO Primary Care Physician:  Bufford Spikes, DO  Chief Complaint  Patient presents with  . Follow-up    meds refill,hypersomnia, rm 11    HPI:  WAYBURN SHALER is a 72 y.o. male  Is seen here as a  revisit  from Dr. Renato Gails for hypersomia, circadian rhythm disorders.   Mr Fayson has trouble to keep awake, will have irregular sleep and wake rhythm.  We have frequently discussed sleep hygiene, and the need to avoid naps in daytime , but he still drinks caffeine .  He feels that caffeine not longer helps him to stay awake, he will "crash and burn "  and will get  fatigued , cannot safely drive - but will not fall asleep.  He crosses a point of not being able to sleep, but craving sleep. 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 This work does allow him not to fall asleep, since he is moving. By 21 hours He feels often paradoxically stimulated , a second wind" and he has  No trouble to stay awake until 2 AM- sleeps until 10 AM. Delayed sleep phase. He is not a free running circadian patient.  Mr. Durnil and used a very interesting sentence today The hardest thing I do in the day  is getting up out of bed in the morning,  the second hardest thing is finding my way to bed in the evening". The patient's past medical history also includes diabetic neuropathy, he had a normal echocardiogram in May 2011 ordered studies and 2012. He was worked up for possible TIA after he developed left facial and arm numbness , which resolved.  at the local emergency room.  He has been doing well on Ritalin which has been allowing him to control his sleepiness somewhat. His employment situation has given him some freedom and were hours. He has a chronic anemia. The patient had a trial of testosterone replacement, and he seems to recall that it actually helped his  fatigue and he felt more energized after that. He will see the prescribing doctor and the near future.   EDS- Epworth score  5-6 points, FSS  24 , GDS 2 points.       Review of Systems: Out of a complete 14 system review, the patient complains of only the following symptoms, and all other reviewed systems are negative.  sleep and wake rhythm disorder.  Adult ADHD depression  Diabetic neuropathy.    History   Social History  . Marital Status: Divorced    Spouse Name: [name]    Number of Children: 1  . Years of Education: 15   Occupational History  . [employer]    Social History Main Topics  . Smoking status: Former Smoker    Types: Cigarettes    Quit date: 09/28/2004  . Smokeless tobacco: Never Used  . Alcohol Use: No     Comment: quit in 2005  . Drug Use: No  . Sexual Activity: Not on file   Other Topics Concern  . Not on file   Social History Narrative   Patient is right handed, quit caffeinated beverages in 2005.    Family History  Problem Relation Age of Onset  . Bone cancer Mother   . Breast cancer Mother   . Cancer Mother   . Lung cancer Father   .  Cancer Father   . Breast cancer Sister   . Cancer Sister   . Lung cancer Brother   . Cancer Sister     type unknown    Past Medical History  Diagnosis Date  . Shoulder pain, left   . Malnutrition 11/28/2008  . Right upper lobe pneumonia 11/28/2008  . Anxiety 07/18/2008  . Irritable bowel syndrome 04/27/2008  . Nocturia 03/13/2008  . COPD (chronic obstructive pulmonary disease) 02/27/2008  . B12 deficiency 02/16/2008    in 5/09: B12 262, MMA 1040  . Chronic diarrhea 01/01/2008    s/p EGD 10/06: H pylori + gastritis, duodenal biopsy normal (Dr. Elnoria Howard); s/p colonoscopy 10/06: hemorrhoids (Dr. Elnoria Howard); evaluated by Dr. Yancey Flemings  in 8/09: tissue transglutaminase AB negative, VIP normal, stool fat content normal, urine 5-HIAA normal; normal GES 11/09 (done for "fluctuating sugars")  . Mild  nonproliferative diabetic retinopathy(362.04) 11/18/2007  . Leg pain, right 09/29/2007  . Fatigue 02/28/2007  . 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 Hyperkalemia 02/09/2007  . Orthostatic hypotension 02/09/2007  . Hypertension 02/09/2007  . Spinal stenosis in cervical region 05/03    MRI  . Erectile dysfunction 09/27/2006    s/p penile implant  . Anemia, iron deficiency 09/27/2006    neg colonoscopy 2006 - Dr. Elnoria Howard; ferritin 152; hgb  15.7  on 07/07  . Hypothyroidism 09/27/2006    TSH 2.639  07/07  . Hypersomnia 09/27/2006    evaluated by Dr. Jetty Duhamel (5/08) and Dr. Vickey Huger; PSG 8/09: chronic delayed sleep phase syndrome (patient sleeps during the day and is awake at night), nocturnal myoclonus (eval for RLS and IDA suggested). Pt advised to change sleeping behavior  . Diabetes mellitus type II 09/07/1984    poorly controlled, complicated by peripheral neuropathy, microalbuminuria, mild non proliferative retinopathy, s/p DKA 7/05, on insulin pump x 4/09, started a pump vacation on 11/19/2008  . Hyperlipidemia   . 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 Hemorrhoids   . Chronic kidney disease   . Unspecified hereditary and idiopathic peripheral neuropathy   . Hyperpotassemia   . Other testicular hypofunction   . Impotence of organic origin   . Other chronic pain   . Other malaise and fatigue   . Unspecified hypothyroidism   . Type II or unspecified type diabetes mellitus with neurological manifestations, not stated as uncontrolled(250.60)   . Other B-complex deficiencies   . Other organic hypersomnia   . Type I (juvenile type) diabetes mellitus without mention of complication, not stated as uncontrolled   . Depression   . Attention deficit disorder with hyperactivity(314.01)   . Hypertrophy of prostate without urinary obstruction and other lower urinary tract symptoms (LUTS)   . Lumbago     Past Surgical History  Procedure Laterality Date  . Penile prosthesis implant     . Tonsillectomy      Current Outpatient Prescriptions  Medication Sig Dispense Refill  . aspirin 81 MG tablet Take 81 mg by mouth every other day.      . diphenoxylate-atropine (LOMOTIL) 2.5-0.025 MG per tablet Take 1 tablet by mouth 3 (three) times daily as needed for diarrhea or loose stools.  90 tablet  3  . ferrous sulfate 325 (65 FE) MG tablet TAKE ONE TABLET BY MOUTH THREE TIMES DAILY   90 tablet  3  . gabapentin (NEURONTIN) 100 MG capsule Take 300 mg by mouth 3 (three) times daily.       . hydrochlorothiazide (HYDRODIURIL) 25 MG tablet TAKE ONE TABLET BY MOUTH  EVERY DAY FOR BLOOD PRESSURE   30 tablet  6  . HYDROcodone-acetaminophen (VICODIN) 5-325 mg TABS tablet Take 1 tablet 5 times a day as needed to control pain  150 tablet  5  . insulin lispro (HUMALOG KWIKPEN) 100 UNIT/ML SOPN Inject 8-10 Units into the skin 3 (three) times daily with meals.  3 pen  6  . insulin NPH (HUMULIN N,NOVOLIN N) 100 UNIT/ML injection Inject 3 Units into the skin at bedtime.      . Insulin Pen Needle (BD PEN NEEDLE NANO U/F) 32G X 4 MM MISC 1 each by Does not apply route 3 (three) times daily.  100 each  5  . LANTUS SOLOSTAR 100 UNIT/ML injection Inject into the skin. 7 units in the AM and 3 units at night      . levothyroxine (SYNTHROID, LEVOTHROID) 75 MCG tablet TAKE ONE TABLET BY MOUTH ONE TIME DAILY   30 tablet  3  . methylphenidate (RITALIN) 20 MG tablet Take 1 tablet (20 mg total) by mouth 3 (three) times daily.  90 tablet  0  . metoprolol tartrate (LOPRESSOR) 25 MG tablet Take 1 tablet (25 mg total) by mouth 2 (two) times daily.  60 tablet  5  . ONE TOUCH ULTRA TEST test strip       . sodium polystyrene (KAYEXALATE) 15 GM/60ML suspension Take 60 mL (15 g total) by mouth once.  500 mL  0  . testosterone cypionate (DEPOTESTOTERONE CYPIONATE) 200 MG/ML injection        No current facility-administered medications for this visit.    Allergies as of 05/31/2013 - Review Complete 05/31/2013  Allergen  Reaction Noted  . Ace inhibitors  01/23/2013  . Angiotensin receptor blockers  01/23/2013  . Bee venom Swelling 01/16/2013  . Cymbalta [duloxetine hcl]  01/23/2013    Vitals: BP 174/72  Pulse 66  Resp 17  Ht 5\' 5"  (1.651 m)  Wt 133 lb (60.328 kg)  BMI 22.13 kg/m2 Last Weight:  Wt Readings from Last 1 Encounters:  05/31/13 133 lb (60.328 kg)   Last Height:   Ht Readings from Last 1 Encounters:  05/31/13 5\' 5"  (1.651 m)    Physical exam:  General: The patient is awake, alert and appears not in acute distress. The patient is well groomed. Head: Normocephalic, atraumatic.  Pale facial skin, pre-aged , Neck is supple. Mallampati 3 , neck circumference 13.5 .  Cardiovascular:  Regular rate and rhythm , without  murmurs or carotid bruit, and without distended neck veins. Respiratory: Lungs are clear to auscultation. Skin:  Without evidence of edema, or rash Trunk: BMI is  elevated and patient  has normal posture.  Neurologic exam : The patient is awake and alert, oriented to place and time.  Memory subjective   described as intact. There is a normal attention span & concentration ability.  Speech is fluent without dysarthria, dysphonia or aphasia. Mood and affect are appropriate.  Cranial nerves: Pupils are equal and briskly reactive to light. Funduscopic exam without   evidence of pallor or edema. Extraocular movements  in vertical and horizontal planes intact and without nystagmus. Visual fields by finger perimetry are intact. Hearing to finger rub intact.  Facial sensation intact to fine touch. Facial motor strength is symmetric and tongue and uvula move midline.  Motor exam:  Normal tone and normal muscle bulk and symmetric normal strength in all extremities.  Sensory:  Fine touch, pinprick and vibration were reduced in both feet- Proprioception is normal.  Coordination: Rapid alternating movements in the fingers/hands is tested and normal. Finger-to-nose maneuver tested and  normal without evidence of ataxia, dysmetria or tremor.  Gait and station: Patient walks without assistive device . Strength within normal limits. Stance is stable and normal.  Deep tendon reflexes: in the  upper and lower extremities are symmetric and intact. Babinski maneuver  downgoing.   Assessment: Refills for stimulant.   Plan:  Treatment plan and additional workup : sleep hygiene implementation.  Talk to Dr Milinda Pointer about testosterone and how long this treatment should be provided.

## 2013-05-31 NOTE — Patient Instructions (Signed)
Attention Deficit Hyperactivity Disorder Attention deficit hyperactivity disorder (ADHD) is a problem with behavior issues based on the way the brain functions (neurobehavioral disorder). It is a common reason for behavior and academic problems in school. CAUSES  The cause of ADHD is unknown in most cases. It may run in families. It sometimes can be associated with learning disabilities and other behavioral problems. SYMPTOMS  There are 3 types of ADHD. The 3 types and some of the symptoms include:  Inattentive  Gets bored or distracted easily.  Loses or forgets things. Forgets to hand in homework.  Has trouble organizing or completing tasks.  Difficulty staying on task.  An inability to organize daily tasks and school work.  Leaving projects, chores, or homework unfinished.  Trouble paying attention or responding to details. Careless mistakes.  Difficulty following directions. Often seems like is not listening.  Dislikes activities that require sustained attention (like chores or homework).  Hyperactive-impulsive  Feels like it is impossible to sit still or stay in a seat. Fidgeting with hands and feet.  Trouble waiting turn.  Talking too much or out of turn. Interruptive.  Speaks or acts impulsively.  Aggressive, disruptive behavior.  Constantly busy or on the go, noisy.  Combined  Has symptoms of both of the above. Often children with ADHD feel discouraged about themselves and with school. They often perform well below their abilities in school. These symptoms can cause problems in home, school, and in relationships with peers. As children get older, the excess motor activities can calm down, but the problems with paying attention and staying organized persist. Most children do not outgrow ADHD but with good treatment can learn to cope with the symptoms. DIAGNOSIS  When ADHD is suspected, the diagnosis should be made by professionals trained in ADHD.  Diagnosis will  include:  Ruling out other reasons for the child's behavior.  The caregivers will check with the child's school and check their medical records.  They will talk to teachers and parents.  Behavior rating scales for the child will be filled out by those dealing with the child on a daily basis. A diagnosis is made only after all information has been considered. TREATMENT  Treatment usually includes behavioral treatment often along with medicines. It may include stimulant medicines. The stimulant medicines decrease impulsivity and hyperactivity and increase attention. Other medicines used include antidepressants and certain blood pressure medicines. Most experts agree that treatment for ADHD should address all aspects of the child's functioning. Treatment should not be limited to the use of medicines alone. Treatment should include structured classroom management. The parents must receive education to address rewarding good behavior, discipline, and limit-setting. Tutoring or behavioral therapy or both should be available for the child. If untreated, the disorder can have long-term serious effects into adolescence and adulthood. HOME CARE INSTRUCTIONS   Often with ADHD there is a lot of frustration among the family in dealing with the illness. There is often blame and anger that is not warranted. This is a life long illness. There is no way to prevent ADHD. In many cases, because the problem affects the family as a whole, the entire family may need help. A therapist can help the family find better ways to handle the disruptive behaviors and promote change. If the child is young, most of the therapist's work is with the parents. Parents will learn techniques for coping with and improving their child's behavior. Sometimes only the child with the ADHD needs counseling. Your caregivers can help   you make these decisions.  Children with ADHD may need help in organizing. Some helpful tips include:  Keep  routines the same every day from wake-up time to bedtime. Schedule everything. This includes homework and playtime. This should include outdoor and indoor recreation. Keep the schedule on the refrigerator or a bulletin board where it is frequently seen. Mark schedule changes as far in advance as possible.  Have a place for everything and keep everything in its place. This includes clothing, backpacks, and school supplies.  Encourage writing down assignments and bringing home needed books.  Offer your child a well-balanced diet. Breakfast is especially important for school performance. Children should avoid drinks with caffeine including:  Soft drinks.  Coffee.  Tea.  However, some older children (adolescents) may find these drinks helpful in improving their attention.  Children with ADHD need consistent rules that they can understand and follow. If rules are followed, give small rewards. Children with ADHD often receive, and expect, criticism. Look for good behavior and praise it. Set realistic goals. Give clear instructions. Look for activities that can foster success and self-esteem. Make time for pleasant activities with your child. Give lots of affection.  Parents are their children's greatest advocates. Learn as much as possible about ADHD. This helps you become a stronger and better advocate for your child. It also helps you educate your child's teachers and instructors if they feel inadequate in these areas. Parent support groups are often helpful. A national group with local chapters is called CHADD (Children and Adults with Attention Deficit Hyperactivity Disorder). PROGNOSIS  There is no cure for ADHD. Children with the disorder seldom outgrow it. Many find adaptive ways to accommodate the ADHD as they mature. SEEK MEDICAL CARE IF:  Your child has repeated muscle twitches, cough or speech outbursts.  Your child has sleep problems.  Your child has a marked loss of  appetite.  Your child develops depression.  Your child has new or worsening behavioral problems.  Your child develops dizziness.  Your child has a racing heart.  Your child has stomach pains.  Your child develops headaches. Document Released: 08/14/2002 Document Revised: 11/16/2011 Document Reviewed: 03/26/2008 Exit[employer] Patient Information 2014 [employer].  

## 2013-06-07 ENCOUNTER — Encounter: Payer: Self-pay | Admitting: Neurology

## 2013-06-13 ENCOUNTER — Other Ambulatory Visit: Payer: Self-pay | Admitting: *Deleted

## 2013-06-13 MED ORDER — HYDROCODONE-ACETAMINOPHEN 5-325 MG PO TABS: ORAL_TABLET | ORAL | Status: DC

## 2013-07-12 ENCOUNTER — Encounter: Payer: Medicare Other | Admitting: Nutrition

## 2013-07-13 ENCOUNTER — Other Ambulatory Visit: Payer: Self-pay | Admitting: Internal Medicine

## 2013-07-13 ENCOUNTER — Other Ambulatory Visit: Payer: Self-pay | Admitting: *Deleted

## 2013-07-13 MED ORDER — HYDROCODONE-ACETAMINOPHEN 5-325 MG PO TABS: ORAL_TABLET | ORAL | Status: DC

## 2013-07-18 ENCOUNTER — Encounter: Payer: Medicare Other | Attending: Endocrinology | Admitting: Nutrition

## 2013-07-18 DIAGNOSIS — E119 Type 2 diabetes mellitus without complications: Secondary | ICD-10-CM | POA: Insufficient documentation

## 2013-07-18 DIAGNOSIS — Z713 Dietary counseling and surveillance: Secondary | ICD-10-CM | POA: Insufficient documentation

## 2013-07-20 ENCOUNTER — Ambulatory Visit (INDEPENDENT_AMBULATORY_CARE_PROVIDER_SITE_OTHER): Payer: Medicare Other | Admitting: Internal Medicine

## 2013-07-20 ENCOUNTER — Encounter: Payer: Self-pay | Admitting: Internal Medicine

## 2013-07-20 VITALS — BP 142/70 | HR 62 | Temp 97.7°F | Wt 133.6 lb

## 2013-07-20 DIAGNOSIS — M545 Low back pain, unspecified: Secondary | ICD-10-CM

## 2013-07-20 DIAGNOSIS — R7989 Other specified abnormal findings of blood chemistry: Secondary | ICD-10-CM

## 2013-07-20 DIAGNOSIS — F1010 Alcohol abuse, uncomplicated: Secondary | ICD-10-CM

## 2013-07-20 DIAGNOSIS — E291 Testicular hypofunction: Secondary | ICD-10-CM

## 2013-07-20 DIAGNOSIS — IMO0001 Reserved for inherently not codable concepts without codable children: Secondary | ICD-10-CM

## 2013-07-20 DIAGNOSIS — E039 Hypothyroidism, unspecified: Secondary | ICD-10-CM

## 2013-07-20 DIAGNOSIS — Z23 Encounter for immunization: Secondary | ICD-10-CM

## 2013-07-20 MED ORDER — HYDROCODONE-ACETAMINOPHEN 5-325 MG PO TABS: ORAL_TABLET | ORAL | Status: DC

## 2013-07-20 NOTE — Progress Notes (Signed)
Patient ID: Benjamin Hodge, male   DOB: 04/24/1941, 72 y.o.   MRN: [id number] Location:  [facility]   Allergies  Allergen Reactions  . Ace Inhibitors   . Angiotensin Receptor Blockers   . Bee Venom Swelling  . Cymbalta [Duloxetine Hcl]     Chief Complaint  Patient presents with  . Medical Managment of Chronic Issues    3-4 months f/u  . other    medication refills on the Hydrocodone and go over some test results.    HPI: Patient is a 72 y.o. white male seen in the office today for medical management of chronic issues. Started testosterone back and his fatigue has improved. Seems that he has more energy. States that it is expensive but it works. Pt states that the Vicodin is no longer working the way it was.  States that when he turns his head he gets sharp pain in his neck. The Vicodin used to prevent that from happening but now its not.  Type 1 DM: states that he feels that he is having to use more insulin to cover his CBG. States that his numbers have been running high. Pt would rather it be a little high because low CBG scares him. Patient states that it has gotten higher in the last 60 days.- CBG >200 in the morning. This morning at around 4am it was 458. Pt states he is taking Humalog with meals, Lantus 7 units in the morning and at night 4 units, Humulin 3 units. 2 weeks when he ran out of Lantus and was supplementing with the Humulin in the place of Lantus. States now he has Lantus and is taking all medication as prescribed.   Review of Systems:  Review of Systems  Constitutional: Negative for fever, chills and weight loss.  Respiratory: Negative for shortness of breath.   Cardiovascular: Negative for chest pain.  Gastrointestinal: Positive for diarrhea and constipation. Negative for nausea and vomiting.       Has been to GI doc d/t diarrhea and constipation  Genitourinary: Negative for dysuria, urgency and frequency.  Neurological:  Positive for dizziness.       When he has a low blood sugar   Past Medical History  Diagnosis Date  . Shoulder pain, left   . Malnutrition 11/28/2008  . Right upper lobe pneumonia 11/28/2008  . Anxiety 07/18/2008  . Irritable bowel syndrome 04/27/2008  . Nocturia 03/13/2008  . COPD (chronic obstructive pulmonary disease) 02/27/2008  . B12 deficiency 02/16/2008    in 5/09: B12 262, MMA 1040  . Chronic diarrhea 01/01/2008    s/p EGD 10/06: H pylori + gastritis, duodenal biopsy normal (Dr. Elnoria Howard); s/p colonoscopy 10/06: hemorrhoids (Dr. Elnoria Howard); evaluated by Dr. Yancey Flemings  in 8/09: tissue transglutaminase AB negative, VIP normal, stool fat content normal, urine 5-HIAA normal; normal GES 11/09 (done for "fluctuating sugars")  . 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 Leg pain, right 09/29/2007  . Fatigue 02/28/2007  . 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 Hyperkalemia 02/09/2007  . Orthostatic hypotension 02/09/2007  . Hypertension 02/09/2007  . Spinal stenosis in cervical region 05/03    MRI  . Erectile dysfunction 09/27/2006    s/p penile implant  . Anemia, iron deficiency 09/27/2006    neg colonoscopy 2006 - Dr. Elnoria Howard; ferritin 152; hgb  15.7  on 07/07  . Hypothyroidism 09/27/2006    TSH 2.639  07/07  . Hypersomnia 09/27/2006    evaluated by Dr.  Clinton Young (5/08) and Dr. Vickey Huger; PSG 8/09: chronic delayed sleep phase syndrome (patient sleeps during the day and is awake at night), nocturnal myoclonus (eval for RLS and IDA suggested). Pt advised to change sleeping behavior  . Diabetes mellitus type II 09/07/1984    poorly controlled, complicated by peripheral neuropathy, microalbuminuria, mild non proliferative retinopathy, s/p DKA 7/05, on insulin pump x 4/09, started a pump vacation on 11/19/2008  . Hyperlipidemia   . 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 Hemorrhoids   . Chronic kidney disease   . Unspecified hereditary and idiopathic peripheral  neuropathy   . Hyperpotassemia   . Other testicular hypofunction   . Impotence of organic origin   . Other chronic pain   . Other malaise and fatigue   . Unspecified hypothyroidism   . Type II or unspecified type diabetes mellitus with neurological manifestations, not stated as uncontrolled(250.60)   . Other B-complex deficiencies   . Other organic hypersomnia   . Type I (juvenile type) diabetes mellitus without mention of complication, not stated as uncontrolled   . Depression   . Attention deficit disorder with hyperactivity(314.01)   . Hypertrophy of prostate without urinary obstruction and other lower urinary tract symptoms (LUTS)   . Lumbago   . ADHD, adult residual type 05/31/2013    Past Surgical History  Procedure Laterality Date  . Penile prosthesis implant    . Tonsillectomy      Social History:   reports that he quit smoking about 8 years ago. His smoking use included Cigarettes. He smoked 0.00 packs per day. He has never used smokeless tobacco. He reports that he does not drink alcohol or use illicit drugs.  Family History  Problem Relation Age of Onset  . Bone cancer Mother   . Breast cancer Mother   . Cancer Mother   . Lung cancer Father   . Cancer Father   . Breast cancer Sister   . Cancer Sister   . Lung cancer Brother   . Cancer Sister     type unknown    Medications: Patient's Medications  New Prescriptions   No medications on file  Previous Medications   ASPIRIN 81 MG TABLET    Take 81 mg by mouth every other day.   DIPHENOXYLATE-ATROPINE (LOMOTIL) 2.5-0.025 MG PER TABLET    Take 1 tablet by mouth 3 (three) times daily as needed for diarrhea or loose stools.   FERROUS SULFATE 325 (65 FE) MG TABLET    TAKE ONE TABLET BY MOUTH THREE TIMES DAILY    GABAPENTIN (NEURONTIN) 100 MG CAPSULE    Take 300 mg by mouth 3 (three) times daily.    HYDROCHLOROTHIAZIDE (HYDRODIURIL) 25 MG TABLET    TAKE ONE TABLET BY MOUTH EVERY DAY FOR BLOOD PRESSURE     HYDROCODONE-ACETAMINOPHEN (NORCO/VICODIN) 5-325 MG PER TABLET    Take one tablet by mouth five times daily as needed to control pain   INSULIN LISPRO (HUMALOG KWIKPEN) 100 UNIT/ML SOPN    Inject 8-10 Units into the skin 3 (three) times daily with meals.   INSULIN NPH (HUMULIN N,NOVOLIN N) 100 UNIT/ML INJECTION    Inject 3 Units into the skin at bedtime.   INSULIN PEN NEEDLE (BD PEN NEEDLE NANO U/F) 32G X 4 MM MISC    1 each by Does not apply route 3 (three) times daily.   LANTUS SOLOSTAR 100 UNIT/ML INJECTION    Inject into the skin. 7 units in the AM and 3 units at night  LEVOTHYROXINE (SYNTHROID, LEVOTHROID) 75 MCG TABLET    TAKE ONE TABLET BY MOUTH ONE TIME DAILY    METHYLPHENIDATE (RITALIN) 20 MG TABLET    Take 1 tablet (20 mg total) by mouth 2 (two) times daily.   METOPROLOL TARTRATE (LOPRESSOR) 25 MG TABLET    TAKE ONE TABLET BY MOUTH TWICE DAILY    ONE TOUCH ULTRA TEST TEST STRIP       SODIUM POLYSTYRENE (KAYEXALATE) 15 GM/60ML SUSPENSION    Take 60 mL (15 g total) by mouth once.   TESTOSTERONE CYPIONATE (DEPOTESTOTERONE CYPIONATE) 200 MG/ML INJECTION      Modified Medications   No medications on file  Discontinued Medications   No medications on file     Physical Exam: Filed Vitals:   07/20/13 1530  BP: 142/70  Pulse: 62  Temp: 97.7 F (36.5 C)  TempSrc: Oral  Weight: 133 lb 9.6 oz (60.601 kg)  SpO2: 94%   Physical Exam  Constitutional: He is oriented to person, place, and time. He appears well-developed and well-nourished.  Cardiovascular: Normal rate, regular rhythm, normal heart sounds and intact distal pulses.   Pulmonary/Chest: Effort normal and breath sounds normal.  Abdominal: Soft. Bowel sounds are normal.  Neurological: He is alert and oriented to person, place, and time.  Skin: Skin is warm and dry.  Psychiatric: He has a normal mood and affect.     Labs reviewed: Basic Metabolic Panel:  Recent Labs  91/47/82 1707 05/03/13 1440 05/22/13 1702  NA 141  140 138  K 6.7* 5.8* 5.5*  CL 103 104 105  CO2 24 25 28   GLUCOSE 292* 237* 396*  BUN 23 19 21   CREATININE 1.12 0.91 1.4  CALCIUM 9.6 8.9 8.3*  TSH 1.460  --  1.03   Liver Function Tests:  Recent Labs  11/07/12 1507 01/18/13 1701 04/20/13 1707 05/22/13 1702  AST 57* 20 41* 25  ALT 41 19 40 21  ALKPHOS 61 55 78 56  BILITOT 0.45 0.3 0.4 0.6  PROT 6.4 5.8* 6.7 6.2  ALBUMIN 3.5  --   --  3.7   CBC:  Recent Labs  08/09/12 1516 11/07/12 1507 01/18/13 1701 04/20/13 1707 04/24/13 1511  WBC 5.6 6.7 6.0 6.8 7.4  NEUTROABS 3.6 4.5 3.9 4.8 5.2  HGB 9.1* 10.5* 10.9* 11.6* 10.7*  HCT 27.3* 31.2* 33.1* 35.7* 31.5*  MCV 106.5* 104.7* 100* 100* 98.1*  PLT 205 208  --   --  204   Lipid Panel:  Lab Results  Component Value Date   HGBA1C 9.4* 04/20/2013  11/11: CT CERVICAL SPINE  Findings: Moderate spondylosis of the cervical spine is present.  No acute fracture or traumatic subluxation is evident. The  vertebral body heights are maintained. Osseous foraminal narrowing  is present bilaterally C4-5 and C5-6. Slightly less severe osseous  foraminal narrowing is seen bilaterally at C6-7. Atherosclerotic  changes are noted within the cavernous carotid arteries and at the  carotid bifurcations bilaterally. The soft tissues are otherwise  unremarkable. Centrilobular emphysematous changes are noted at the  lung apices.  IMPRESSION:  1. No acute fracture or traumatic subluxation.  2. Moderate spondylosis of the cervical spine.  3. Emphysema.  Assessment/Plan 1. Need for prophylactic vaccination and inoculation against influenza Flu shot given  2. Type II or unspecified type diabetes mellitus without mention of complication, uncontrolled -f/u with endocrine about uncontrolled glucose, but needs to keep appointment with diabetes educator who can help him with his new glucometer -emphasized that his  alcohol intake is affecting his sugars as well -educated on the difference between  long and short acting insulins and that they are not interchangeable  3. Low serum testosterone level -continues on testosterone shots which he restarted -has f/u with hematology and urology planned--needs to reschedule his testosterone shot b/c it was at the same time as this appointment  4. Alcohol abuse -reviewed that his margaritas are contributing to his hyperglycemia and difficulty to manage diabetes -says he has cut back by half, but could not look me in the eye and say so -has a h/o of this being much more serious in the past  5. Hypothyroidism -f/u tsh today, cont current synthroid  6.  Lumbar spinal stenosis -not as well controlled as previously even with 5 hydrocodone per day -we may need to change his pain medication in the future, but I am hesitant in view of his alcohol use -has been advised not to drink with his pain medications -also notes shooting pain in the back of his neck when he turns his neck to the left only -reviewed CT neck from 2011 and it shows some stenosis that could explain his symptoms b/c I am sure it is worse 3 years later--if this remains a concern next visit, I will repeat his CT cspine  More than 1/2 of this 40 minute visit was spent counseling patient on his diabetes and alcohol abuse.  Labs/tests ordered:  Tsh, hba1c, cbc, cmp Next appt:  3 mos

## 2013-07-21 ENCOUNTER — Encounter: Payer: Self-pay | Admitting: *Deleted

## 2013-07-21 LAB — BASIC METABOLIC PANEL
BUN/Creatinine Ratio: 16 (ref 10–22)
BUN: 16 mg/dL (ref 8–27)
CO2: 26 mmol/L (ref 18–29)
Calcium: 9.4 mg/dL (ref 8.6–10.2)
Chloride: 105 mmol/L (ref 97–108)
Creatinine, Ser: 1.03 mg/dL (ref 0.76–1.27)
GFR calc Af Amer: 85 mL/min/{1.73_m2} (ref >59)
GFR calc non Af Amer: 73 mL/min/{1.73_m2} (ref >59)
Glucose: 53 mg/dL — ABNORMAL LOW (ref 65–99)
Potassium: 5.3 mmol/L — ABNORMAL HIGH (ref 3.5–5.2)
Sodium: 146 mmol/L — ABNORMAL HIGH (ref 134–144)

## 2013-07-21 LAB — CBC WITH DIFFERENTIAL/PLATELET
Basophils Absolute: 0.1 10*3/uL (ref 0.0–0.2)
Basos: 1 %
Eos: 3 %
Eosinophils Absolute: 0.2 10*3/uL (ref 0.0–0.4)
HCT: 37.3 % — ABNORMAL LOW (ref 37.5–51.0)
Hemoglobin: 12.1 g/dL — ABNORMAL LOW (ref 12.6–17.7)
Immature Grans (Abs): 0 10*3/uL (ref 0.0–0.1)
Immature Granulocytes: 0 %
Lymphocytes Absolute: 1.3 10*3/uL (ref 0.7–3.1)
Lymphs: 20 %
MCH: 32.7 pg (ref 26.6–33.0)
MCHC: 32.4 g/dL (ref 31.5–35.7)
MCV: 101 fL — ABNORMAL HIGH (ref 79–97)
Monocytes Absolute: 0.8 10*3/uL (ref 0.1–0.9)
Monocytes: 12 %
Neutrophils Absolute: 4.3 10*3/uL (ref 1.4–7.0)
Neutrophils Relative %: 64 %
RBC: 3.7 x10E6/uL — ABNORMAL LOW (ref 4.14–5.80)
RDW: 12.7 % (ref 12.3–15.4)
WBC: 6.6 10*3/uL (ref 3.4–10.8)

## 2013-07-21 LAB — HEMOGLOBIN A1C
Est. average glucose Bld gHb Est-mCnc: 177 mg/dL
Hgb A1c MFr Bld: 7.8 % — ABNORMAL HIGH (ref 4.8–5.6)

## 2013-07-21 LAB — TSH: TSH: 1.7 u[IU]/mL (ref 0.450–4.500)

## 2013-07-25 ENCOUNTER — Encounter: Payer: Medicare Other | Admitting: Nutrition

## 2013-07-25 ENCOUNTER — Other Ambulatory Visit: Payer: Medicare Other | Admitting: Lab

## 2013-07-25 DIAGNOSIS — E119 Type 2 diabetes mellitus without complications: Secondary | ICD-10-CM

## 2013-07-25 NOTE — Patient Instructions (Signed)
Do 2 blood sugar readings to calibrate the meter, when it alarms in two hours. Do one calibration reading q 12 hours.   Watch DVD on how to change the sensor.

## 2013-07-25 NOTE — Progress Notes (Signed)
Pt. Was instructed on the use of the Dexcom G4 continuous glucose sensor.  He required some assistance in inserting the sensor, but reported good understanding on the need to test blood sugars twice in 2 hours, and again q 12 hours.  He was strongly encouraged to watch the DVD, to reinforce what he learned today.  He agreed to do this.  He had no final questions.    His sensor was set to alarm at blood sugars over 250, and below 80.  We did not set the rapid rising blood sugar alarm, but did set the rapid dropping alarm of more than 3mg /min.  We reviewed what to do when the alarms go off, and he reported good understanding of this..  He had no final questions.

## 2013-08-01 ENCOUNTER — Telehealth: Payer: Self-pay | Admitting: Internal Medicine

## 2013-08-01 NOTE — Telephone Encounter (Signed)
Sure, if that is the patient's preference

## 2013-08-01 NOTE — Telephone Encounter (Signed)
Pt's son is calling per the pt's request. Pt is requesting to switch care from Dr. Marina Goodell to Dr. Leone Payor. He says Dr. Manus Gunning referred him to Dr. Leone Payor.

## 2013-08-01 NOTE — Telephone Encounter (Signed)
Pt would like to switch care from Dr. Marina Goodell to Dr. Leone Payor. Will you approve the switch?

## 2013-08-01 NOTE — Telephone Encounter (Signed)
Dr. Gessner will you accept this pt? Please advise. 

## 2013-08-02 NOTE — Telephone Encounter (Signed)
Sure

## 2013-08-02 NOTE — Telephone Encounter (Signed)
Toniann Fail please schedule this pt for an new pt appt with Dr. Leone Payor.

## 2013-08-07 ENCOUNTER — Encounter: Payer: Self-pay | Admitting: Internal Medicine

## 2013-08-07 NOTE — Telephone Encounter (Signed)
Scheduled appt with the pt's son on 09-15-12 with Dr. Leone Payor.

## 2013-08-11 ENCOUNTER — Telehealth: Payer: Self-pay | Admitting: Neurology

## 2013-08-11 NOTE — Telephone Encounter (Signed)
Left message for patient to call and reschedule 4/24 appointment per Carolyn's schedule.

## 2013-08-16 ENCOUNTER — Encounter: Payer: Medicare Other | Attending: Endocrinology | Admitting: Nutrition

## 2013-08-16 DIAGNOSIS — E119 Type 2 diabetes mellitus without complications: Secondary | ICD-10-CM | POA: Insufficient documentation

## 2013-08-16 DIAGNOSIS — Z713 Dietary counseling and surveillance: Secondary | ICD-10-CM | POA: Insufficient documentation

## 2013-08-16 NOTE — Progress Notes (Signed)
Benjamin Hodge says he likes this, but does not want to do this all of the time.  He can not remember how to put the new sensor on, and "can not read and understand the directions in the manual".  He wants to wear this for 7 days and keep a food record with insulin doses, and will bring it back in one week to download this, for a review of blood sugars/insulin doses.  He was given a food record/insulin record and we reviewed how to fill this out.   We put a new sensor on, and written instructions were given to do 2 calibrations in 2 hours.  We reviewed how to put those readings into the receiver, and he reported that he remembers and can do this.  Written instructions were given for this, as well as the need to do the calibrations q 12 hours.  He reread these instructions and reported that he understands this completely.

## 2013-08-23 ENCOUNTER — Ambulatory Visit: Payer: Medicare Other | Admitting: Nutrition

## 2013-08-23 DIAGNOSIS — Z0279 Encounter for issue of other medical certificate: Secondary | ICD-10-CM

## 2013-08-23 DIAGNOSIS — E1065 Type 1 diabetes mellitus with hyperglycemia: Secondary | ICD-10-CM

## 2013-08-28 ENCOUNTER — Other Ambulatory Visit: Payer: Self-pay | Admitting: Internal Medicine

## 2013-08-28 ENCOUNTER — Other Ambulatory Visit: Payer: Self-pay | Admitting: Endocrinology

## 2013-08-28 NOTE — Progress Notes (Signed)
Pt. Here to download his Dexcom sensor.  Download shows extremely high postprandial reading--all over 300, some over 400.  Pt. Reports taking his insulin 20-30 min. After eating.   I showed him the download and strongly encouraged him to take his insulin 10-20 min. Before eating.  He agreed to do this, and said no one had told him this.  Suggested that he may need less meal time coverage, because the blood sugars will not be going up so high after eating.  He reported good understanding of this.  Believe giving him only one suggestion each visit will have a better impact on his memory, do to his attention deficit problem.   We restarted a new sensor, and he will come in in 2 weeks for review of the new download.

## 2013-08-28 NOTE — Patient Instructions (Signed)
Take premeal insulin 15-20 min. Before each meal.

## 2013-08-30 ENCOUNTER — Encounter: Payer: Self-pay | Admitting: Nurse Practitioner

## 2013-09-05 ENCOUNTER — Ambulatory Visit: Payer: Medicare Other

## 2013-09-05 ENCOUNTER — Encounter: Payer: Medicare Other | Admitting: Nutrition

## 2013-09-05 DIAGNOSIS — E1065 Type 1 diabetes mellitus with hyperglycemia: Secondary | ICD-10-CM

## 2013-09-05 NOTE — Progress Notes (Signed)
Patient dropped off  His dexcom receiver for me to download on 08/26/13, with note saying that he is now taking his insulin before meals except on 3 occasions when he forgot.   He sleeps from 1AM to 1PM, and he eats only 2 meals.  His breakfast is at 1:30 PM.  He will snack sometimes around 6PM, and will usually take some insulin for this.  I take 1u of Humalog for every 20 grams of carb., and do a correction dose of 2units for blood sugars in the 200s.s, and 3-4units if in the 300s.  Sometimes he does not take the total dose that he has figured, especially if it is over 6-7 units. He is very afraid of dropping low.  He will usually then subtract 1-2 units.  His supper meal is between MN and 1AM.    He reports that his blood sugars have been "so much better" since he  Is taking his insulin 5-10 min. Before meals.  He reports that they have been much better since this last week.   MN ave. Blood sugars: 220, 3AM: 225, 7AM: 275,  1PM (waking)250, 3PM( 2hr. PcB) : 250, 9PM: 180, MN:(acS)  Discussed need to do a correction of 1u/70 Points, and discussed the math on how to do this.  He will take his present blood sugar, subtract 130, and divide that number by 70.  He reported good understanding of this.  Written directions were given to him for this.    I showed him how high his average blood sugars were, and suggested he increase his Lantus from 7u bid to 8u.  He agreed to do this.  He did not want to wear the sensor at this time.  He will see Dr. Lucianne Muss in 3 weeks and wants to put it on the week before he comes in to see him.  He will call me in 2 weeks with blood sugar readings.

## 2013-09-05 NOTE — Patient Instructions (Signed)
Test blood sugars ac and HS Call  Blood sugar readings to me in 2 weeks. Start sensor 1 week before seeing Dr. Lucianne Muss

## 2013-09-11 ENCOUNTER — Other Ambulatory Visit: Payer: Self-pay | Admitting: *Deleted

## 2013-09-11 DIAGNOSIS — M545 Low back pain, unspecified: Secondary | ICD-10-CM

## 2013-09-11 MED ORDER — HYDROCODONE-ACETAMINOPHEN 5-325 MG PO TABS: ORAL_TABLET | ORAL | Status: DC

## 2013-09-12 ENCOUNTER — Ambulatory Visit (INDEPENDENT_AMBULATORY_CARE_PROVIDER_SITE_OTHER): Payer: Medicare Other

## 2013-09-12 VITALS — BP 163/83 | HR 54 | Resp 20 | Ht 66.0 in | Wt 135.0 lb

## 2013-09-12 DIAGNOSIS — E1149 Type 2 diabetes mellitus with other diabetic neurological complication: Secondary | ICD-10-CM

## 2013-09-12 DIAGNOSIS — R52 Pain, unspecified: Secondary | ICD-10-CM

## 2013-09-12 DIAGNOSIS — E1140 Type 2 diabetes mellitus with diabetic neuropathy, unspecified: Secondary | ICD-10-CM

## 2013-09-12 DIAGNOSIS — S92309A Fracture of unspecified metatarsal bone(s), unspecified foot, initial encounter for closed fracture: Secondary | ICD-10-CM

## 2013-09-12 DIAGNOSIS — R609 Edema, unspecified: Secondary | ICD-10-CM

## 2013-09-12 DIAGNOSIS — S92919S Unspecified fracture of unspecified toe(s), sequela: Secondary | ICD-10-CM

## 2013-09-12 DIAGNOSIS — E1142 Type 2 diabetes mellitus with diabetic polyneuropathy: Secondary | ICD-10-CM

## 2013-09-12 DIAGNOSIS — M79609 Pain in unspecified limb: Secondary | ICD-10-CM

## 2013-09-12 DIAGNOSIS — S8290XS Unspecified fracture of unspecified lower leg, sequela: Secondary | ICD-10-CM

## 2013-09-12 NOTE — Progress Notes (Signed)
   Subjective:    Patient ID: Benjamin Hodge, male    DOB: 1941/05/10, 73 y.o.   MRN: [id number] "I noticed yesterday pain and swelling, there may be a cut there."  Foot Injury  Incident onset: Sunday. The incident occurred at home. The injury mechanism is unknown. Pain location: 2nd digit left foot. The quality of the pain is described as aching (sore). The pain is at a severity of 6/10. The pain is moderate. The pain has been intermittent since onset. Associated symptoms include an inability to bear weight. Associated symptoms comments: Swelling, bruising. He reports no foreign bodies present. The symptoms are aggravated by weight bearing. He has tried nothing for the symptoms.      Review of Systems  Cardiovascular: Negative.   Gastrointestinal: Negative.   Endocrine: Negative.   Genitourinary: Negative.   Musculoskeletal: Positive for joint swelling.  Skin: Negative.   Allergic/Immunologic: Negative.   Neurological: Negative.   Hematological: Negative.   All other systems reviewed and are negative.       Objective:   Physical Exam Neurovascular status intact pedal pulses palpable DP postal for PT plus one over 4 bilateral Refill time 3 seconds all digits. Skin temperature is warm turgor normal there is significant +2 edema left forefoot and digits. Patient indicates his past Sunday he ran his foot into a stack of chairs he had a lace up shoe on at the time x-rays take this time confirm fractured second third and fourth metatarsal necks or fractures of toes at the proximal phalanx IP joint second third there may be also some subluxation and contusion of the joints with capsulitis as well. Significant +2 edema and ecchymosis consistent with trauma contusion of the foot multiple fractures metatarsals and phalanges. Most or minimally displaced clinically the foot appears to be rectus except for edema. Vascular status is intact.       Assessment & Plan:  Assessment multiple  fractures metatarsals and toes left foot patient placed in an anklet to maintain edema reduce edema also compression the pneumatic boot is applied maintain boot for 6 day weeks' duration recheck in 4 weeks for followup x-rays patient to do sitdown or sedentary work only minimal walking activities on 510 minutes per hour as needed keep foot elevated when possible use ice or a Provost needed for pain. Recheck in 4 weeks for followup visit  Alvan Dame DPM

## 2013-09-12 NOTE — Patient Instructions (Signed)
Fracture A fracture is a break in a bone, due to a force on the bone that is greater than the bone's strength can handle. There are many types of fractures, including:  Complete fracture: The break passes completely through the bone.  Displaced: The ends of the bone fragments are not properly aligned.  Non-displaced: The ends of the bone fragments are in proper alignment.  Incomplete fracture (greenstick): The break does not pass completely through the bone. Incomplete fractures may or may not be angular (angulated).  Open fracture (compound): Part of the broken bone pokes through the skin. Open fractures have a high risk for infection.  Closed fracture: The fracture has not broken through the skin.  Comminuted fracture: The bone is broken into more than two pieces.  Compression fracture: The break occurs from extreme pressure on the bone (includes crushing injury).  Impacted fracture: The broken bone ends have been driven into each other.  Avulsion fracture: A ligament or tendon pulls a small piece of bone off from the main bony segment.  Pathologic fracture: A fracture due to the bone being made weak by a disease (osteoporosis or tumors).  Stress fracture: A fracture caused by intense exercise or repetitive and prolonged pressure that makes the bone weak. SYMPTOMS   Pain, tenderness, bleeding, bruising, and swelling at the fracture site.  Weakness and inability to bear weight on the injured extremity.  Paleness and deformity (sometimes).  Loss of pulse, numbness, tingling, or paralysis below the fracture site (usually a limb); these are emergencies. CAUSES  Bone being subjected to a force greater than its strength. RISK INCREASES WITH:  Contact sports and falls from heights.  Previous or current bone problems (osteoporosis or tumors).  Poor balance.  Poor strength and flexibility. PREVENTION   Warm up and stretch properly before activity.  Maintain physical  fitness:  Cardiovascular fitness.  Muscle strength.  Flexibility and endurance.  Wear proper protective equipment.  Use proper exercise technique. RELATED COMPLICATIONS   Bone fails to heal (nonunion).  Bone heals in a poor position (malunion).  Low blood volume (hypovolemic), shock due to blood loss.  Clump of fat cells travels through the blood (fat embolus) from the injury site to the lungs or brain (more common with thigh fractures).  Obstruction of nearby arteries. TREATMENT  Treatment first requires realigning of the bones (reduction) by a medically trained person, if the fracture is displaced. After realignment if the fracture is completed, or for non-displaced fractures, ice and medicine are used to reduce pain and inflammation. The bone and adjacent joints are then restrained with a splint, cast, or brace to allow the bones to heal without moving. Surgery is sometimes needed, to reposition the bones and hold the position with rods, pins, plates, or screws. Restraint for long periods of time may result in muscle and joint weakness or build up of fluid in tissues (edema). For this reason, physical therapy is often needed to regain strength and full range of motion. Recovery is complete when there is no bone motion at the fracture site and x-rays (radiographs) show complete healing.  MEDICATION   General anesthesia, sedation, or muscle relaxants may be needed to allow for realignment of the fracture. If pain medicine is needed, nonsteroidal anti-inflammatory medicines (aspirin and ibuprofen), or other minor pain relievers (acetaminophen), are often advised.  Do not take pain medicine for 7 days before surgery.  Stronger pain relievers may be prescribed by your caregiver. Use only as directed and only as much   as you need. SEEK MEDICAL CARE IF:   The following occur after restraint or surgery. (Report any of these signs immediately):  Swelling above or below the fracture  site.  Severe, persistent pain.  Blue or gray skin below the fracture site, especially under the nails. Numbness or loss of feeling below the fracture site. Document Released: 08/24/2005 Document Revised: 08/10/2012 Document Reviewed: 12/06/2008 [facility] Patient Information 2014 [place], [place].

## 2013-09-14 ENCOUNTER — Encounter: Payer: Self-pay | Admitting: *Deleted

## 2013-09-14 ENCOUNTER — Telehealth: Payer: Self-pay | Admitting: *Deleted

## 2013-09-14 NOTE — Telephone Encounter (Addendum)
Pt request note to employer to have seated duties, to have the availability to elevate the left foot.  See note faxed to [phone].

## 2013-09-15 ENCOUNTER — Encounter: Payer: Self-pay | Admitting: Internal Medicine

## 2013-09-15 ENCOUNTER — Ambulatory Visit (INDEPENDENT_AMBULATORY_CARE_PROVIDER_SITE_OTHER): Payer: Medicare Other | Admitting: Internal Medicine

## 2013-09-15 ENCOUNTER — Telehealth: Payer: Self-pay | Admitting: *Deleted

## 2013-09-15 VITALS — BP 156/60 | HR 64 | Ht 64.5 in | Wt 136.0 lb

## 2013-09-15 DIAGNOSIS — K589 Irritable bowel syndrome without diarrhea: Secondary | ICD-10-CM

## 2013-09-15 DIAGNOSIS — R197 Diarrhea, unspecified: Secondary | ICD-10-CM

## 2013-09-15 NOTE — Assessment & Plan Note (Addendum)
He has diarrhea predominant IBS. At this point he seems to be in a good phase. I recommended he try Benefiber to details daily, on a regular basis. I think if he can promote more regular bowel movements he may not have the problems of the multiple loose stools and unloading fashion. He may continue as needed Lomotil as well and things are bothersome. GI followup as needed.

## 2013-09-15 NOTE — Telephone Encounter (Signed)
Mailed copy of work limitations to pt's home address.

## 2013-09-15 NOTE — Patient Instructions (Signed)
Today we are giving you a handout to read and follow on Irritable Bowel Syndrome.  Take your fiber supplement when you have problems.  I appreciate the opportunity to care for you.

## 2013-09-15 NOTE — Assessment & Plan Note (Signed)
IBS

## 2013-09-15 NOTE — Progress Notes (Signed)
         Subjective:    Patient ID: Benjamin Hodge, male    DOB: 10/29/1940, 73 y.o.   MRN: [id number]  HPI The patient is here at his own request, having previously seen Dr. Marina Goodell and other gastroenterologists. He is at least a ten-year history of chronic diarrhea symptoms. He's had an extensive workup as outlined in the chart and does not have celiac disease or inflammatory bowel disease or other causes of diarrhea. He is presumed to have diarrhea from diabetes or IBS. He has been using chronic Lomotil. It helps. Actually lately for the past several weeks she's had regular bowel movements without problems. He does use artificial sweeteners but he says rarely perhaps 1 packet of equal a cup of coffee daily.  What he does describe when he is having problems is not moving his bowels for 2 days and then having a normal formed bowel movement and progressive and more loose watery bowel movements throughout the day that can be urgent. He will sometimes, rarely have fecal incontinence at night while sleeping. He denies any urinary incontinence.  He has used alcohol heavily in the past I think, but says now at most 2 beverages a day in many times is not drink alcohol.  He is concerned that he cannot gaining weight.  Medications, allergies, past medical history, past surgical history, family history and social history are reviewed and updated in the EMR.  Review of Systems Diabetes has not been well-controlled though his hemoglobin A1c is improving with a recent one of 7.8% down from 9.4%.    Objective:   Physical Exam General:  NAD Eyes:   anicteric Lungs:  clear Heart:  S1S2 no rubs, murmurs or gallops Abdomen:  soft and nontender, BS+  Rectal: Male staff present  Anoderm inspection revealed tags Anal wink was absent Digital exam revealed normal resting tone and voluntary squeeze. No mass or stool No mass or rectocele present. Simulated defecation with valsalva revealed appropriate  abdominal contraction and descent.     Ext:   no edema    Data Reviewed:  Previous GI notes  Wt Readings from Last 3 Encounters:  09/15/13 136 lb (61.689 kg)  09/12/13 135 lb (61.236 kg)  07/20/13 133 lb 9.6 oz (60.601 kg)       Assessment & Plan:   1. IBS (irritable bowel syndrome)   2. DIARRHEA, CHRONIC    Please see problem oriented charting. Regarding this patient's inability to gain weight. He is on stimulants or ADHD and has had poorly controlled diabetes for a long time and I explained that I think that the most likely cause of his stable weight.

## 2013-09-21 ENCOUNTER — Other Ambulatory Visit: Payer: Self-pay | Admitting: *Deleted

## 2013-09-21 ENCOUNTER — Other Ambulatory Visit: Payer: Self-pay | Admitting: Endocrinology

## 2013-09-21 MED ORDER — INSULIN GLARGINE 100 UNIT/ML SOLOSTAR PEN: PEN_INJECTOR | SUBCUTANEOUS | Status: DC

## 2013-09-26 ENCOUNTER — Telehealth: Payer: Self-pay | Admitting: *Deleted

## 2013-09-26 NOTE — Telephone Encounter (Signed)
Pt states the left foot is healing well, but the feet have blisters on them.  I offered pt an appt, pt states he has an appt for 09/27/2013.  I told pt to rest, do not use hot / warm to the feet.  Pt agreed.

## 2013-09-27 ENCOUNTER — Ambulatory Visit (INDEPENDENT_AMBULATORY_CARE_PROVIDER_SITE_OTHER): Payer: Medicare Other

## 2013-09-27 VITALS — BP 152/92 | HR 57 | Resp 17 | Ht 66.0 in | Wt 135.0 lb

## 2013-09-27 DIAGNOSIS — L97509 Non-pressure chronic ulcer of other part of unspecified foot with unspecified severity: Secondary | ICD-10-CM

## 2013-09-27 DIAGNOSIS — E1140 Type 2 diabetes mellitus with diabetic neuropathy, unspecified: Secondary | ICD-10-CM

## 2013-09-27 DIAGNOSIS — R52 Pain, unspecified: Secondary | ICD-10-CM

## 2013-09-27 DIAGNOSIS — R609 Edema, unspecified: Secondary | ICD-10-CM

## 2013-09-27 DIAGNOSIS — E1149 Type 2 diabetes mellitus with other diabetic neurological complication: Secondary | ICD-10-CM

## 2013-09-27 DIAGNOSIS — S92309A Fracture of unspecified metatarsal bone(s), unspecified foot, initial encounter for closed fracture: Secondary | ICD-10-CM

## 2013-09-27 NOTE — Progress Notes (Signed)
   Subjective:    Patient ID: Benjamin Hodge, male    DOB: 1940/09/14, 73 y.o.   MRN: [id number] Pt states his pain and a lot of swelling has gone away, but the right foot is taking a beating from the boot.  I sleep and kick my feet all night long. HPI    Review of Systems no new changes or findings. Patient does admit his blood sugars been running in the 2-300 at times although now [facility] 200 to stand a better job keeping her sugar under control.     Objective:   Physical Exam Vascular status is intact with pedal pulses palpable DP postal for PT plus one over 4 bilateral +1 to + edema left Refill time 3 seconds. Patient does have the excoriation or blister skin over the anterior instep area on the left foot secondary to boot were walker on the left side there is an excoriation on the dorsal foot the right foot has multiple areas of irritation or blistering patient does have restless leg was sleeping in his boot is causing some damage to his contralateral foot my recommendation at this time is to maintain socks we sleeping and put the old pillowcase over the cam boot to help cushion and prevent irritation and skin ulcerations from contact while he sleeping. X-rays AP lateral oblique views left foot taken at this time reveal fractures metatarsal necks 23 and 4 posse fifth metatarsal neck fracture is a compression type fractures with slight displacement and rotation is being noted clinically slight edema over the digits are rectus there is no significant displacement transverse or sagittal plane patient does have fracturing and forefoot edema consistent with post fracture status the blisters or lesions are relatively superficial with some slight localized erythema no ascending psoas and tenderness no secondary infection is noted       Assessment & Plan:  Assessment this time diabetes with neuropathy restless leg syndrome patient does have some superficial ulcerations down to dermal level blister  like lesions which are dressed with Neosporin and Band-Aid dressings at this time 18 cleansing with soap and water Neosporin and Band-Aid dressing also recommend socks was sleeping and n the fracture boot to keep a cushioned to prevent it from irritating the contralateral foot. Reappointed 4 weeks from now for further followup x-ray and if healing or stable name possibly discontinue boot at that time based on progress otherwise maintain ambulation the air fracture boot at all times due to also recommended white cotton acrylic socks and posse is crocs for around the house as a slipper.  Alvan Dame DPM

## 2013-09-27 NOTE — Patient Instructions (Signed)
Diabetes and Foot Care Diabetes may cause you to have problems because of poor blood supply (circulation) to your feet and legs. This may cause the skin on your feet to become thinner, break easier, and heal more slowly. Your skin may become dry, and the skin may peel and crack. You may also have nerve damage in your legs and feet causing decreased feeling in them. You may not notice minor injuries to your feet that could lead to infections or more serious problems. Taking care of your feet is one of the most important things you can do for yourself.  HOME CARE INSTRUCTIONS  Wear shoes at all times, even in the house. Do not go barefoot. Bare feet are easily injured.  Check your feet daily for blisters, cuts, and redness. If you cannot see the bottom of your feet, use a mirror or ask someone for help.  Wash your feet with warm water (do not use hot water) and mild soap. Then pat your feet and the areas between your toes until they are completely dry. Do not soak your feet as this can dry your skin.  Apply a moisturizing lotion or petroleum jelly (that does not contain alcohol and is unscented) to the skin on your feet and to dry, brittle toenails. Do not apply lotion between your toes.  Trim your toenails straight across. Do not dig under them or around the cuticle. File the edges of your nails with an emery board or nail file.  Do not cut corns or calluses or try to remove them with medicine.  Wear clean socks or stockings every day. Make sure they are not too tight. Do not wear knee-high stockings since they may decrease blood flow to your legs.  Wear shoes that fit properly and have enough cushioning. To break in new shoes, wear them for just a few hours a day. This prevents you from injuring your feet. Always look in your shoes before you put them on to be sure there are no objects inside.  Do not cross your legs. This may decrease the blood flow to your feet.  If you find a minor scrape,  cut, or break in the skin on your feet, keep it and the skin around it clean and dry. These areas may be cleansed with mild soap and water. Do not cleanse the area with peroxide, alcohol, or iodine.  When you remove an adhesive bandage, be sure not to damage the skin around it.  If you have a wound, look at it several times a day to make sure it is healing.  Do not use heating pads or hot water bottles. They may burn your skin. If you have lost feeling in your feet or legs, you may not know it is happening until it is too late.  Make sure your health care provider performs a complete foot exam at least annually or more often if you have foot problems. Report any cuts, sores, or bruises to your health care provider immediately. SEEK MEDICAL CARE IF:   You have an injury that is not healing.  You have cuts or breaks in the skin.  You have an ingrown nail.  You notice redness on your legs or feet.  You feel burning or tingling in your legs or feet.  You have pain or cramps in your legs and feet.  Your legs or feet are numb.  Your feet always feel cold. SEEK IMMEDIATE MEDICAL CARE IF:   There is increasing redness,   swelling, or pain in or around a wound.  There is a red line that goes up your leg.  Pus is coming from a wound.  You develop a fever or as directed by your health care provider.  You notice a bad smell coming from an ulcer or wound. Document Released: 08/21/2000 Document Revised: 04/26/2013 Document Reviewed: 01/31/2013 [employer] Patient Information 2014 [employer].  

## 2013-10-10 ENCOUNTER — Other Ambulatory Visit: Payer: Self-pay | Admitting: Neurology

## 2013-10-10 ENCOUNTER — Encounter: Payer: Medicare Other | Attending: Endocrinology | Admitting: Nutrition

## 2013-10-10 DIAGNOSIS — F901 Attention-deficit hyperactivity disorder, predominantly hyperactive type: Secondary | ICD-10-CM

## 2013-10-10 DIAGNOSIS — E119 Type 2 diabetes mellitus without complications: Secondary | ICD-10-CM | POA: Insufficient documentation

## 2013-10-10 DIAGNOSIS — Z713 Dietary counseling and surveillance: Secondary | ICD-10-CM | POA: Insufficient documentation

## 2013-10-10 DIAGNOSIS — E1065 Type 1 diabetes mellitus with hyperglycemia: Secondary | ICD-10-CM

## 2013-10-10 DIAGNOSIS — G4730 Sleep apnea, unspecified: Principal | ICD-10-CM

## 2013-10-10 DIAGNOSIS — IMO0002 Reserved for concepts with insufficient information to code with codable children: Secondary | ICD-10-CM

## 2013-10-10 DIAGNOSIS — G4710 Hypersomnia, unspecified: Secondary | ICD-10-CM

## 2013-10-10 NOTE — Telephone Encounter (Signed)
Patient needs refill of prescription generic Ritalin.

## 2013-10-10 NOTE — Patient Instructions (Signed)
Please insert the transmitter into the sensor housing when you get home. Do 2  calibration readings in 2 hours when the alarm goes off Call if questions.

## 2013-10-10 NOTE — Telephone Encounter (Signed)
Dr Dohmeier is out of the office.  Forwarding request to [facility].   

## 2013-10-10 NOTE — Progress Notes (Signed)
Pt. Is here today to put in a sensor for his visit next week with Dr. Lucianne Muss.  He did not bring his transmitter, but a sensor was inserted into his abdominal area by the patient with instructions from me.  He wil go home and try to find the transmitter and insert it into the sensor as shown.  He does not remember where the transmitter is, but will look for it and call me if he has questions with snapping it into the sensor housing.   An appt. Was made for Dr. Lucianne Muss for 1 week.  He was told to bring his transmitter to the appt., so that we can download this.  He was reminded to take his insulin before all meals.  He agreed to do these things.Benjamin Hodge

## 2013-10-11 ENCOUNTER — Ambulatory Visit: Payer: Medicare Other | Admitting: Internal Medicine

## 2013-10-11 ENCOUNTER — Ambulatory Visit: Payer: Medicare Other

## 2013-10-11 MED ORDER — METHYLPHENIDATE HCL 20 MG PO TABS: 20.0000 mg | ORAL_TABLET | Freq: Two times a day (BID) | ORAL | Status: DC

## 2013-10-12 ENCOUNTER — Other Ambulatory Visit: Payer: Self-pay | Admitting: *Deleted

## 2013-10-12 DIAGNOSIS — M545 Low back pain, unspecified: Secondary | ICD-10-CM

## 2013-10-12 MED ORDER — HYDROCODONE-ACETAMINOPHEN 5-325 MG PO TABS: ORAL_TABLET | ORAL | Status: DC

## 2013-10-12 NOTE — Telephone Encounter (Signed)
Called patient and left message informing him that his Rx was ready to be picked up at the front desk and if he has any other problems, questions or concerns to call the office. °

## 2013-10-18 ENCOUNTER — Other Ambulatory Visit: Payer: Self-pay | Admitting: Internal Medicine

## 2013-10-18 ENCOUNTER — Ambulatory Visit (INDEPENDENT_AMBULATORY_CARE_PROVIDER_SITE_OTHER): Payer: Medicare Other | Admitting: Endocrinology

## 2013-10-18 ENCOUNTER — Encounter: Payer: Self-pay | Admitting: Endocrinology

## 2013-10-18 VITALS — BP 150/68 | HR 69 | Temp 98.0°F | Resp 14 | Ht 65.0 in | Wt 134.6 lb

## 2013-10-18 DIAGNOSIS — E1142 Type 2 diabetes mellitus with diabetic polyneuropathy: Secondary | ICD-10-CM

## 2013-10-18 DIAGNOSIS — E039 Hypothyroidism, unspecified: Secondary | ICD-10-CM

## 2013-10-18 DIAGNOSIS — E1065 Type 1 diabetes mellitus with hyperglycemia: Secondary | ICD-10-CM

## 2013-10-18 DIAGNOSIS — IMO0002 Reserved for concepts with insufficient information to code with codable children: Secondary | ICD-10-CM

## 2013-10-18 MED ORDER — INSULIN ISOPHANE HUMAN 100 UNIT/ML KWIKPEN: 6.0000 [IU] | PEN_INJECTOR | Freq: Every day | SUBCUTANEOUS | Status: DC

## 2013-10-18 NOTE — Progress Notes (Signed)
Patient ID: Benjamin Hodge, male   DOB: [birth year], 73 y.o.   MRN: [id number]   Reason for Appointment : Follow up for Type 1 Diabetes  History of Present Illness          Diagnosis: Type 1 diabetes mellitus, date of diagnosis: 1986         Past history: Patient has had very labile diabetes and generally difficult to control for several years. He had previously been tried on an Accu-Chek pump also which did not appear to be unofficial and he was not very keen on continuing Has been tried on basal bolus insulin regimen for several years. His insulin has been changed continuously without achieving good control. Has been tried on Levemir instead of Lantus without any significant benefit. Also because of tendency to markedly increased blood sugars overnight he has been started on NPH insulin at bedtime along with his twice a day Lantus  INSULIN regimen is described as: Lantus; 7 units at 2 am and 7 units at 2 pm. Humalog 1:10 carbohydrate coverage  Recent history:  He has not been seen in followup since 05/2013 His blood sugars are again showing the same patterns of marked increase in glucose l around the time of his evening meal and overnight and variable readings the rest of the day He was previously taking NPH insulin at bedtime also but he ran out and has not taken this; has increased his evening Lantus. Recently has had a tendency to low relatively low readings on waking up which is usually around 12:30-1:30 p.m. p. Also has relatively good readings later in the day. His median glucose around supper time is 351 with some readings after eating He is also having a unusual  mealtimes with eating supper around 1 AM and breakfast around 1-2 PM. He is generally not eating a lunch and is mostly having small snacks. Previously was having alcoholic drinks in the evenings also He was prescribed a continuous glucose monitor and he has finally been able to start using this with the help of the nurse  educator requiring several visits  Glucose monitoring:  done 3.6 times a day         Glucometer: One Touch.      Blood Glucose readings from meter download:   PREMEAL Breakfast  afternoon   late p.m.   dinner  Overall  Glucose range:  46-255   76-286   43-413   109-407    median:  93    130   180   154       CGM RECORD INTERPRETATION    Dates of Recording: 10/11/13 through 10/16/13       Sensor  summary:  Quality of the data is excellent with adequate sensor function. Glucose average overall is 146 +/-68. As 53% blood sugars in the high range, 17% low range and 31% within target of 80-130. Overall range of blood sugar low-355      Glycemic patterns:   overall hyperglycemia occurring between about 1 AM-9 AM and also between 3 PM-8 PM although with more variability in the evenings. Blood sugars are declining from about 9 AM until 2 PM with several low excursions      Overnight periods:   blood sugars are rising usually especially on 2 nights when his blood sugars reached 300, blood sugar declined to normal and relatively low levels only on 2/6.      Preprandial periods:   blood sugars are mostly low around his  first meal at 12-1 PM and variable at his evening meal late at night but overall somewhat high, averaging about 140.      Postprandial periods:   blood sugars appear to be significantly higher after his breakfast on  3 days and also higher after her evening meal.4 times particularly on 2/4 and 2/6      Hypoglycemia:  occurring mostly between 9 AM-3 PM and transiently at midnight and 9 PM    Factors causing hyperglycemia: eating relatively high fat meal at night and not taking enough mealtime coverage, some days we'll have prolonged hyperglycemia after dinner. Also may be getting rebound after low sugars after waking up     Self-care: The diet that the patient has been following is: Usually eating frozen meal at dinnertime  Meals: 3 meals per day.      Physical activity: exercise: No  programmed activity.           Wt Readings from Last 3 Encounters:  10/18/13 134 lb 9.6 oz (61.054 kg)  09/27/13 135 lb (61.236 kg)  09/15/13 136 lb (61.689 kg)   Lab Results  Component Value Date   HGBA1C 7.8* 07/20/2013   HGBA1C 9.4* 04/20/2013   HGBA1C 6.3* 01/18/2013   Lab Results  Component Value Date   MICROALBUR 1.35 09/17/2008   LDLCALC  Value: 78        Total Cholesterol/HDL:CHD Risk Coronary Heart Disease Risk Table                     Men   Women  1/2 Average Risk   3.4   3.3  Average Risk       5.0   4.4  2 X Average Risk   9.6   7.1  3 X Average Risk  23.4   11.0        Use the calculated Patient Ratio above and the CHD Risk Table to determine the patient's CHD Risk.        ATP III CLASSIFICATION (LDL):  <100     mg/dL   Optimal  409-811  mg/dL   Near or Above                    Optimal  130-159  mg/dL   Borderline  914-782  mg/dL   High  >956     mg/dL   Very High 10/21/863   CREATININE 1.03 07/20/2013       Medication List       This list is accurate as of: 10/18/13  2:47 PM.  Always use your most recent med list.               aspirin 81 MG tablet  Take 81 mg by mouth every other day.     diphenoxylate-atropine 2.5-0.025 MG per tablet  Commonly known as:  LOMOTIL  Take 1 tablet by mouth 3 (three) times daily as needed for diarrhea or loose stools.     ferrous sulfate 325 (65 FE) MG tablet  TAKE ONE TABLET BY MOUTH THREE TIMES DAILY     gabapentin 100 MG capsule  Commonly known as:  NEURONTIN  Take 300 mg by mouth 3 (three) times daily.     hydrochlorothiazide 25 MG tablet  Commonly known as:  HYDRODIURIL  TAKE ONE TABLET BY MOUTH EVERY DAY FOR BLOOD PRESSURE     HYDROcodone-acetaminophen 5-325 MG per tablet  Commonly known as:  NORCO/VICODIN  Take one tablet by mouth  five times daily as needed to control pain     Insulin Glargine 100 UNIT/ML Solostar Pen  Commonly known as:  LANTUS  Take 7 units in the morning and 7 units at bedtime     insulin  lispro 100 UNIT/ML KiwkPen  Commonly known as:  HUMALOG KWIKPEN  Inject 8-10 Units into the skin 3 (three) times daily with meals.     Insulin Pen Needle 32G X 4 MM Misc  Commonly known as:  BD PEN NEEDLE NANO U/F  1 each by Does not apply route 3 (three) times daily.     levothyroxine 75 MCG tablet  Commonly known as:  SYNTHROID, LEVOTHROID  TAKE ONE TABLET BY MOUTH ONE TIME DAILY     methylphenidate 20 MG tablet  Commonly known as:  RITALIN  Take 20 mg by mouth daily.     metoprolol tartrate 25 MG tablet  Commonly known as:  LOPRESSOR  TAKE ONE TABLET BY MOUTH TWICE DAILY     ONE TOUCH ULTRA TEST test strip  Generic drug:  glucose blood     sodium polystyrene 15 GM/60ML suspension  Commonly known as:  KAYEXALATE  Take 60 mL (15 g total) by mouth once.     testosterone cypionate 200 MG/ML injection  Commonly known as:  DEPOTESTOTERONE CYPIONATE        Allergies:  Allergies  Allergen Reactions  . Ace Inhibitors   . Angiotensin Receptor Blockers   . Bee Venom Swelling  . Cymbalta [Duloxetine Hcl]     Past Medical History  Diagnosis Date  . Shoulder pain, left   . Malnutrition 11/28/2008  . Right upper lobe pneumonia 11/28/2008  . Anxiety 07/18/2008  . Irritable bowel syndrome 04/27/2008  . Nocturia 03/13/2008  . COPD (chronic obstructive pulmonary disease) 02/27/2008  . B12 deficiency 02/16/2008    in 5/09: B12 262, MMA 1040  . Chronic diarrhea 01/01/2008    s/p EGD 10/06: H pylori + gastritis, duodenal biopsy normal (Dr. Elnoria Howard); s/p colonoscopy 10/06: hemorrhoids (Dr. Elnoria Howard); evaluated by Dr. Yancey Flemings  in 8/09: tissue transglutaminase AB negative, VIP normal, stool fat content normal, urine 5-HIAA normal; normal GES 11/09 (done for "fluctuating sugars")  . 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 Leg pain, right 09/29/2007  . Fatigue 02/28/2007  . 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 Hyperkalemia 02/09/2007  . Orthostatic  hypotension 02/09/2007  . Hypertension 02/09/2007  . Spinal stenosis in cervical region 05/03    MRI  . Erectile dysfunction 09/27/2006    s/p penile implant  . Anemia, iron deficiency 09/27/2006    neg colonoscopy 2006 - Dr. Elnoria Howard; ferritin 152; hgb  15.7  on 07/07  . Hypothyroidism 09/27/2006    TSH 2.639  07/07  . Hypersomnia 09/27/2006    evaluated by Dr. Jetty Duhamel (5/08) and Dr. Vickey Huger; PSG 8/09: chronic delayed sleep phase syndrome (patient sleeps during the day and is awake at night), nocturnal myoclonus (eval for RLS and IDA suggested). Pt advised to change sleeping behavior  . Diabetes mellitus type II 09/07/1984    poorly controlled, complicated by peripheral neuropathy, microalbuminuria, mild non proliferative retinopathy, s/p DKA 7/05, on insulin pump x 4/09, started a pump vacation on 11/19/2008  . Hyperlipidemia   . 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 Hemorrhoids   . Chronic kidney disease   . Unspecified hereditary and idiopathic peripheral neuropathy   . Hyperpotassemia   . Other testicular hypofunction   . Impotence of organic origin   . Other  chronic pain   . Other malaise and fatigue   . Unspecified hypothyroidism   . Type II or unspecified type diabetes mellitus with neurological manifestations, not stated as uncontrolled   . Other B-complex deficiencies   . Other organic hypersomnia   . Type I (juvenile type) diabetes mellitus without mention of complication, not stated as uncontrolled   . Depression   . Attention deficit disorder with hyperactivity(314.01)   . Hypertrophy of prostate without urinary obstruction and other lower urinary tract symptoms (LUTS)   . Lumbago   . ADHD, adult residual type 05/31/2013    Past Surgical History  Procedure Laterality Date  . Penile prosthesis implant    . Tonsillectomy    . Colonoscopy  06/25/2005    Dr. Jeani HawkingPatrick Hung    Family History  Problem Relation Age of Onset  . Bone cancer Mother   . Breast cancer Mother    . Cancer Mother   . Lung cancer Father   . Cancer Father   . Breast cancer Sister   . Cancer Sister   . Lung cancer Brother   . Cancer Sister     type unknown    Social History:  reports that he quit smoking about 9 years ago. His smoking use included Cigarettes. He smoked 0.00 packs per day. He has never used smokeless tobacco. He reports that he does not drink alcohol or use illicit drugs.    Review of Systems:   ANEMIA: His hemoglobin  Has been low,has been followed by hematologist  He has been diagnosed to have hypogonadism and is  being treated by his urologist with injections every 2 weeks He has had mild hypertension followed by PCP History of chronic diarrhea of unclear etiology Has had BPH HYPERKALEMIA: Potassium was high previously No pedal edema   History of mild hypothyroidism, currently taking 75 mcg   Lab Results  Component Value Date   TSH 1.700 07/20/2013    Physical Examination:  BP 150/68  Pulse 69  Temp(Src) 98 F (36.7 C)  Resp 14  Ht 5\' 5"  (1.651 m)  Wt 134 lb 9.6 oz (61.054 kg)  BMI 22.40 kg/m2  SpO2 94%      No pedal edema    ASSESSMENT:  Diabetes type 1:   His blood sugars are again showing the  patterns of marked increase late at night and overnight and variable readings the rest of the day as discussed in history of present illness He is having hyperglycemia related to following factors:  Periodically will have higher fat meals at night  May not be coming some of his morning meals with enough insulin  Rebound from low sugars on waking up  Variable action profile of Lantus insulin  Not taking his mealtime insulin before eating and frequently taking it after He has somewhat benefited from the continuous glucose monitoring with pattern recognition although has not been able to avoid hypoglycemia since he is not adjusting his Lantus based on fasting readings. Sometimes readings appear to be relatively lower on the CGM compared to  fingerstick His A1c has been higher than usual, previously falsely low because of anemia and will need to be rechecked  PLAN:    Start taking Humulin NPH at bedtime again to help overnight hypoglycemia especially related to higher fat meals. He will start with 4 units and reduce his Lantus also at bedtime which will avoid low sugars on waking up  Start taking Humalog before meals especially if he is knows  how much he will eat  Consider increasing suppertime carbohydrate coverage of his postprandial readings are consistently high on the next visit  Continue same dose of Lantus in the morning  Make sure he covers all snacks and alcohol intake with Humalog insulin  Counseling time over 50% of today's 25 minute visit  Airis Barbee 10/18/2013, 2:47 PM

## 2013-10-18 NOTE — Patient Instructions (Signed)
Lantus 4 in pm and start Humulin N 4 at dinner and stay on 7 Lantus in am  Take Humalog 5-15 min before meals if possible  Cover all snacks with Humalog if > 15g carbs

## 2013-10-19 ENCOUNTER — Other Ambulatory Visit: Payer: Self-pay | Admitting: *Deleted

## 2013-10-19 MED ORDER — DIPHENOXYLATE-ATROPINE 2.5-0.025 MG PO TABS: 1.0000 | ORAL_TABLET | Freq: Three times a day (TID) | ORAL | Status: DC | PRN

## 2013-10-19 MED ORDER — GLUCOSE BLOOD VI STRP: ORAL_STRIP | Status: DC

## 2013-10-20 ENCOUNTER — Ambulatory Visit (INDEPENDENT_AMBULATORY_CARE_PROVIDER_SITE_OTHER): Payer: Medicare Other

## 2013-10-20 ENCOUNTER — Other Ambulatory Visit: Payer: Self-pay | Admitting: *Deleted

## 2013-10-20 VITALS — BP 161/64 | HR 70 | Resp 12

## 2013-10-20 DIAGNOSIS — E1142 Type 2 diabetes mellitus with diabetic polyneuropathy: Secondary | ICD-10-CM

## 2013-10-20 DIAGNOSIS — E1149 Type 2 diabetes mellitus with other diabetic neurological complication: Secondary | ICD-10-CM

## 2013-10-20 DIAGNOSIS — L97509 Non-pressure chronic ulcer of other part of unspecified foot with unspecified severity: Secondary | ICD-10-CM

## 2013-10-20 DIAGNOSIS — S92309A Fracture of unspecified metatarsal bone(s), unspecified foot, initial encounter for closed fracture: Secondary | ICD-10-CM

## 2013-10-20 DIAGNOSIS — E1140 Type 2 diabetes mellitus with diabetic neuropathy, unspecified: Secondary | ICD-10-CM

## 2013-10-20 NOTE — Progress Notes (Signed)
   Subjective:    Patient ID: KEYO OPALEWSKI, male    DOB: April 07, 1941, 73 y.o.   MRN: [id number]  HPI Comments: '' LT FOOT HAVE BLISTER FROM WEARING THE BOOT AND STILL SWOLLEN AND RT FOOT STILL THE SAME.''    FYI  PT HERE TO FOLLOW UP ON THE RT FOOT. LT FOOT IS UNDER WORKERS COMP.  Foot Pain      Review of Systems no new changes or findings     Objective:   Physical Exam Vascular status is intact although diminished as previously mentioned patient presents at this time for followup of multiple fractures of his left foot metatarsals 23 and 4 as well as digital fractures. However since he was seen last patient has switched over care to his workers comp carrier for his employer. Apparently the injury did occur at work on Sunday patient indicates he thinks he may have tripped or fallen. His first 2 visits was seen at this time x-rays revealed metatarsal fractures with no additional displacements however since that time he's also been seen by Dr. Ophelia Charter orthopedic surgeon. He spoke to follow back he is in one month from now. Workers comp Medical illustrator is indicated that we are not see him for his left foot fracture. Pedicures and transferred over to Dr. Ophelia Charter. Patient seems somewhat confused is not certain how this all worked out I advised him that basically he has got 2 claims open one through his primary care in the other now through his workers comp will have to sort this out as to coverage. Examine the left foot this time which is minimal swelling patient describes minimal pain or discomfort the digits appear rectus his only concerns at this time is the blistering or lesions or erosions or caused by the boot on the dorsum of his left foot instep area as well as the medial malleoli are and talonavicular area of his right foot. These are secondary to the cast wearing. However they are improving there is eschar tissue formed patient devised to continue cleanse with soap and water and apply  Neosporin and Band-Aid or gauze dressing. There is no open wounds no apparent discharge drainage no ascending cellulitis or lymphangitis noted on either foot are patient does have diabetes with neuropathy and complications advised to contact us immediately 50 changes or exacerbations or go to the emergency room does any infective process that begins.       Assessment & Plan:  Assessment this time patient is here status post buckle fractures left foot however [place] transferred over to his workers comp carrier. I did give him advised to maintain the boot for at least another week as as previously instructed and possibly off for another month if Dr. Ophelia Charter is restricted for his followup. If he chooses to stop the boot I suggested maintaining a stiff soled shoe which she currently does have patient indicates he would rather have me continue to take care of his foot however I indicated that is likely of my hands now as he may not be on his workers Scientist, research (medical). Patient is advised to contact effect be of any additional assistance however suggest he maintain immobilization for complete healing is recommended he is advised to likely have some residual edema swelling and arthrosis of the left foot as result of this injury. Next  Alvan Dame DPM

## 2013-10-20 NOTE — Patient Instructions (Signed)
ANTIBACTERIAL SOAP INSTRUCTIONS   Please follow the instructions your doctor has marked.   Shower as usual. Before getting out, place a drop of antibacterial liquid soap (Dial) on a wet, clean washcloth.  Gently wipe washcloth over affected area.  Afterward, rinse the area with warm water.  Blot the area dry with a soft cloth and cover with antibiotic ointment (neosporin, polysporin, bacitracin) and band aid or gauze and tape  Place 3-4 drops of antibacterial liquid soap in a quart of warm tap water.  Submerge foot into water for 20 minutes.  If bandage was applied after your procedure, leave on to allow for easy lift off, then remove and continue with soak for the remaining time.  Next, blot area dry with a soft cloth and cover with a bandage.  Apply other medications as directed by your doctor, such as cortisporin otic solution (eardrops) or neosporin antibiotic ointment  Cleansing feet and the ulcer or blister sites daily with soapy water as instructed apply Neosporin and a Band-Aid or gauze dressing as needed until heel

## 2013-10-23 ENCOUNTER — Other Ambulatory Visit: Payer: Self-pay | Admitting: Internal Medicine

## 2013-10-25 ENCOUNTER — Ambulatory Visit: Payer: Medicare Other | Admitting: Hematology and Oncology

## 2013-10-25 ENCOUNTER — Other Ambulatory Visit: Payer: Medicare Other

## 2013-10-25 ENCOUNTER — Ambulatory Visit (INDEPENDENT_AMBULATORY_CARE_PROVIDER_SITE_OTHER): Payer: Medicare Other | Admitting: Nurse Practitioner

## 2013-10-25 ENCOUNTER — Encounter: Payer: Self-pay | Admitting: Nurse Practitioner

## 2013-10-25 ENCOUNTER — Other Ambulatory Visit: Payer: Self-pay | Admitting: Endocrinology

## 2013-10-25 ENCOUNTER — Encounter: Payer: Self-pay | Admitting: *Deleted

## 2013-10-25 VITALS — BP 148/70 | HR 64 | Temp 97.7°F | Wt 135.6 lb

## 2013-10-25 DIAGNOSIS — L03019 Cellulitis of unspecified finger: Secondary | ICD-10-CM

## 2013-10-25 DIAGNOSIS — L02519 Cutaneous abscess of unspecified hand: Secondary | ICD-10-CM

## 2013-10-25 MED ORDER — SULFAMETHOXAZOLE-TMP DS 800-160 MG PO TABS
1.0000 | ORAL_TABLET | Freq: Two times a day (BID) | ORAL | Status: DC
Start: 2013-10-25 — End: 2013-12-05

## 2013-10-25 NOTE — Addendum Note (Signed)
Addended by: Maurice Small on: 10/25/2013 04:35 PM   Modules accepted: Orders

## 2013-10-25 NOTE — Progress Notes (Signed)
Patient ID: Benjamin Hodge, male   DOB: 1940/10/09, 73 y.o.   MRN: [id number]    Allergies  Allergen Reactions  . Ace Inhibitors   . Angiotensin Receptor Blockers   . Bee Venom Swelling  . Cymbalta [Duloxetine Hcl]     Chief Complaint  Patient presents with  . Acute Visit    swelling in LT thumb x 1 month, accident at work with foot & then now having this swelling 1-2 weeks after that.    HPI: Patient is a 73 y.o. male seen in the office today for swelling and pain in left thumb; had accident 1 month ago and at that time was not having symptoms of the hand; went had FOOT evaluated by ortho and had hand evaluated by orthopedics ~2 weeks ago who did an xray which was negative there has not been much changes since then except more pain  No fever or chills, no hx of gout, no other injuries  Review of Systems:  Review of Systems  Constitutional: Negative for fever, chills and malaise/fatigue.  Respiratory: Negative for shortness of breath.   Cardiovascular: Negative for chest pain.  Musculoskeletal: Positive for myalgias. Negative for joint pain (no pain in thumb joint but to lateral aspect of finger).  Neurological: Negative for headaches.     Past Medical History  Diagnosis Date  . Shoulder pain, left   . Malnutrition 11/28/2008  . Right upper lobe pneumonia 11/28/2008  . Anxiety 07/18/2008  . Irritable bowel syndrome 04/27/2008  . Nocturia 03/13/2008  . COPD (chronic obstructive pulmonary disease) 02/27/2008  . B12 deficiency 02/16/2008    in 5/09: B12 262, MMA 1040  . Chronic diarrhea 01/01/2008    s/p EGD 10/06: H pylori + gastritis, duodenal biopsy normal (Dr. Elnoria HowardHung); s/p colonoscopy 10/06: hemorrhoids (Dr. Elnoria HowardHung); evaluated by Dr. Yancey FlemingsJohn Perry  in 8/09: tissue transglutaminase AB negative, VIP normal, stool fat content normal, urine 5-HIAA normal; normal GES 11/09 (done for "fluctuating sugars")  . 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 Leg pain,  right 09/29/2007  . Fatigue 02/28/2007  . 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 Hyperkalemia 02/09/2007  . Orthostatic hypotension 02/09/2007  . Hypertension 02/09/2007  . Spinal stenosis in cervical region 05/03    MRI  . Erectile dysfunction 09/27/2006    s/p penile implant  . Anemia, iron deficiency 09/27/2006    neg colonoscopy 2006 - Dr. Elnoria HowardHung; ferritin 152; hgb  15.7  on 07/07  . Hypothyroidism 09/27/2006    TSH 2.639  07/07  . Hypersomnia 09/27/2006    evaluated by Dr. Jetty Duhamellinton Young (5/08) and Dr. Vickey Hugerohmeier; PSG 8/09: chronic delayed sleep phase syndrome (patient sleeps during the day and is awake at night), nocturnal myoclonus (eval for RLS and IDA suggested). Pt advised to change sleeping behavior  . Diabetes mellitus type II 09/07/1984    poorly controlled, complicated by peripheral neuropathy, microalbuminuria, mild non proliferative retinopathy, s/p DKA 7/05, on insulin pump x 4/09, started a pump vacation on 11/19/2008  . Hyperlipidemia   . 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 Hemorrhoids   . Chronic kidney disease   . Unspecified hereditary and idiopathic peripheral neuropathy   . Hyperpotassemia   . Other testicular hypofunction   . Impotence of organic origin   . Other chronic pain   . Other malaise and fatigue   . Unspecified hypothyroidism   . Type II or unspecified type diabetes mellitus with neurological manifestations, not stated as uncontrolled   . Other B-complex deficiencies   .  Other organic hypersomnia   . Type I (juvenile type) diabetes mellitus without mention of complication, not stated as uncontrolled   . Depression   . Attention deficit disorder with hyperactivity(314.01)   . Hypertrophy of prostate without urinary obstruction and other lower urinary tract symptoms (LUTS)   . Lumbago   . ADHD, adult residual type 05/31/2013   Past Surgical History  Procedure Laterality Date  . Penile prosthesis implant    . Tonsillectomy    . Colonoscopy   06/25/2005    Dr. Jeani Hawking   Social History:   reports that he quit smoking about 9 years ago. His smoking use included Cigarettes. He smoked 0.00 packs per day. He has never used smokeless tobacco. He reports that he does not drink alcohol or use illicit drugs.  Family History  Problem Relation Age of Onset  . Bone cancer Mother   . Breast cancer Mother   . Cancer Mother   . Lung cancer Father   . Cancer Father   . Breast cancer Sister   . Cancer Sister   . Lung cancer Brother   . Cancer Sister     type unknown    Medications: Patient's Medications  New Prescriptions   No medications on file  Previous Medications   ASPIRIN 81 MG TABLET    Take 81 mg by mouth every other day.   DIPHENOXYLATE-ATROPINE (LOMOTIL) 2.5-0.025 MG PER TABLET    Take 1 tablet by mouth 3 (three) times daily as needed for diarrhea or loose stools.   FERROUS SULFATE 325 (65 FE) MG TABLET    TAKE ONE TABLET BY MOUTH THREE TIMES DAILY    FERROUS SULFATE 325 (65 FE) MG TABLET    TAKE ONE TABLET BY MOUTH THREE TIMES DAILY    GABAPENTIN (NEURONTIN) 100 MG CAPSULE    Take 300 mg by mouth 3 (three) times daily.    GLUCOSE BLOOD (ONE TOUCH ULTRA TEST) TEST STRIP    Use to check blood sugars 3 times per day dx code 250.00   HYDROCHLOROTHIAZIDE (HYDRODIURIL) 25 MG TABLET    TAKE ONE TABLET BY MOUTH EVERY DAY FOR BLOOD PRESSURE    HYDROCODONE-ACETAMINOPHEN (NORCO/VICODIN) 5-325 MG PER TABLET    Take one tablet by mouth five times daily as needed to control pain   INSULIN GLARGINE (LANTUS) 100 UNIT/ML SOLOSTAR PEN    Take 7 units in the morning and 7 units at bedtime   INSULIN ISOPHANE HUMAN (HUMULIN N PEN) 100 UNIT/ML KIWKPEN    Inject 6 Units into the skin daily before supper. 4-6 units   INSULIN LISPRO (HUMALOG KWIKPEN) 100 UNIT/ML SOPN    Inject 8-10 Units into the skin 3 (three) times daily with meals.   INSULIN PEN NEEDLE (BD PEN NEEDLE NANO U/F) 32G X 4 MM MISC    1 each by Does not apply route 3 (three) times  daily.   LEVOTHYROXINE (SYNTHROID, LEVOTHROID) 75 MCG TABLET    TAKE ONE TABLET BY MOUTH ONE TIME DAILY    METHYLPHENIDATE (RITALIN) 20 MG TABLET    Take 20 mg by mouth daily.   METOPROLOL TARTRATE (LOPRESSOR) 25 MG TABLET    TAKE ONE TABLET BY MOUTH TWICE DAILY    SODIUM POLYSTYRENE (KAYEXALATE) 15 GM/60ML SUSPENSION    Take 60 mL (15 g total) by mouth once.   TESTOSTERONE CYPIONATE (DEPOTESTOTERONE CYPIONATE) 200 MG/ML INJECTION      Modified Medications   No medications on file  Discontinued Medications   No  medications on file     Physical Exam:  Filed Vitals:   10/25/13 1521  BP: 148/70  Pulse: 64  Temp: 97.7 F (36.5 C)  TempSrc: Oral  Weight: 135 lb 9.6 oz (61.508 kg)  SpO2: 95%    Physical Exam  Constitutional: He is oriented to person, place, and time and well-developed, well-nourished, and in no distress.  Musculoskeletal: He exhibits tenderness (to thumb). He exhibits no edema.  Medial aspect of left thumb swollen with small area of purulent drainage visualized under skin- size of pencil eraser  Joint not painful; able to move without difficulty   Neurological: He is alert and oriented to person, place, and time.  Skin: Skin is warm and dry.     Labs reviewed: Basic Metabolic Panel:  Recent Labs  09/62/83 1707 05/03/13 1440 05/22/13 1702 07/20/13 1646  NA 141 140 138 146*  K 6.7* 5.8* 5.5* 5.3*  CL 103 104 105 105  CO2 24 25 28 26   GLUCOSE 292* 237* 396* 53*  BUN 23 19 21 16   CREATININE 1.12 0.91 1.4 1.03  CALCIUM 9.6 8.9 8.3* 9.4  TSH 1.460  --  1.03 1.700   Liver Function Tests:  Recent Labs  11/07/12 1507 01/18/13 1701 04/20/13 1707 05/22/13 1702  AST 57* 20 41* 25  ALT 41 19 40 21  ALKPHOS 61 55 78 56  BILITOT 0.45 0.3 0.4 0.6  PROT 6.4 5.8* 6.7 6.2  ALBUMIN 3.5  --   --  3.7   No results found for this basename: LIPASE, AMYLASE,  in the last 8760 hours No results found for this basename: AMMONIA,  in the last 8760  hours CBC:  Recent Labs  11/07/12 1507  04/20/13 1707 04/24/13 1511 07/20/13 1646  WBC 6.7  < > 6.8 7.4 6.6  NEUTROABS 4.5  < > 4.8 5.2 4.3  HGB 10.5*  < > 11.6* 10.7* 12.1*  HCT 31.2*  < > 35.7* 31.5* 37.3*  MCV 104.7*  < > 100* 98.1* 101*  PLT 208  --   --  204  --   < > = values in this interval not displayed. Lipid Panel: No results found for this basename: CHOL, HDL, LDLCALC, TRIG, CHOLHDL, LDLDIRECT,  in the last 8760 hours TSH:  Recent Labs  04/20/13 1707 05/22/13 1702 07/20/13 1646  TSH 1.460 1.03 1.700   A1C: No components found with this basename: A1C,    Assessment/Plan 1. Abscess of thumb -thumb was prepped and cleaned with betadine; 1 unit of lidocaine administered for numbing and small hole made to express drainage; pt tolerated well  -warm epsom salt soaks three times daily - sulfamethoxazole-trimethoprim (BACTRIM DS) 800-160 MG per tablet; Take 1 tablet by mouth 2 (two) times daily.  Dispense: 14 tablet; Refill: 0 starting today  - Wound culture done for drainage   has routine follow up tomorrow with Dr Bufford Spikes, to keep this appt To follow up in 1 week if not improved Also instructed on return precautions and when to seek immediate medical attention

## 2013-10-25 NOTE — Patient Instructions (Signed)
Medication (bactrim DS) sent to pharmacy Start today and take this twice daily until done Warm epsom salt soaks for 20 mins three times daily for 1 week  Call if finger becomes worse, swelling worsens, fevers or heat occurs  Abscess An abscess is an infected area that contains a collection of pus and debris.It can occur in almost any part of the body. An abscess is also known as a furuncle or boil. CAUSES  An abscess occurs when tissue gets infected. This can occur from blockage of oil or sweat glands, infection of hair follicles, or a minor injury to the skin. As the body tries to fight the infection, pus collects in the area and creates pressure under the skin. This pressure causes pain. People with weakened immune systems have difficulty fighting infections and get certain abscesses more often.  SYMPTOMS Usually an abscess develops on the skin and becomes a painful mass that is red, warm, and tender. If the abscess forms under the skin, you may feel a moveable soft area under the skin. Some abscesses break open (rupture) on their own, but most will continue to get worse without care. The infection can spread deeper into the body and eventually into the bloodstream, causing you to feel ill.  DIAGNOSIS  Your caregiver will take your medical history and perform a physical exam. A sample of fluid may also be taken from the abscess to determine what is causing your infection. TREATMENT  Your caregiver may prescribe antibiotic medicines to fight the infection. However, taking antibiotics alone usually does not cure an abscess. Your caregiver may need to make a small cut (incision) in the abscess to drain the pus. In some cases, gauze is packed into the abscess to reduce pain and to continue draining the area. HOME CARE INSTRUCTIONS   Only take over-the-counter or prescription medicines for pain, discomfort, or fever as directed by your caregiver.  If you were prescribed antibiotics, take them as  directed. Finish them even if you start to feel better.  If gauze is used, follow your caregiver's directions for changing the gauze.  To avoid spreading the infection:  Keep your draining abscess covered with a bandage.  Wash your hands well.  Do not share personal care items, towels, or whirlpools with others.  Avoid skin contact with others.  Keep your skin and clothes clean around the abscess.  Keep all follow-up appointments as directed by your caregiver. SEEK MEDICAL CARE IF:   You have increased pain, swelling, redness, fluid drainage, or bleeding.  You have muscle aches, chills, or a general ill feeling.  You have a fever. MAKE SURE YOU:   Understand these instructions.  Will watch your condition.  Will get help right away if you are not doing well or get worse. Document Released: 06/03/2005 Document Revised: 02/23/2012 Document Reviewed: 11/06/2011 [facility] Patient Information 2014 [place], [place].

## 2013-10-26 ENCOUNTER — Ambulatory Visit (INDEPENDENT_AMBULATORY_CARE_PROVIDER_SITE_OTHER): Payer: Medicare Other | Admitting: Internal Medicine

## 2013-10-26 ENCOUNTER — Encounter: Payer: Self-pay | Admitting: Internal Medicine

## 2013-10-26 VITALS — BP 146/78 | HR 60 | Temp 96.4°F | Wt 135.0 lb

## 2013-10-26 DIAGNOSIS — S92909A Unspecified fracture of unspecified foot, initial encounter for closed fracture: Secondary | ICD-10-CM

## 2013-10-26 DIAGNOSIS — Z9181 History of falling: Secondary | ICD-10-CM

## 2013-10-26 DIAGNOSIS — E109 Type 1 diabetes mellitus without complications: Secondary | ICD-10-CM

## 2013-10-26 DIAGNOSIS — F329 Major depressive disorder, single episode, unspecified: Secondary | ICD-10-CM

## 2013-10-26 DIAGNOSIS — S92902A Unspecified fracture of left foot, initial encounter for closed fracture: Secondary | ICD-10-CM

## 2013-10-26 DIAGNOSIS — L02519 Cutaneous abscess of unspecified hand: Secondary | ICD-10-CM

## 2013-10-26 DIAGNOSIS — E1142 Type 2 diabetes mellitus with diabetic polyneuropathy: Secondary | ICD-10-CM

## 2013-10-26 DIAGNOSIS — L03019 Cellulitis of unspecified finger: Secondary | ICD-10-CM

## 2013-10-26 DIAGNOSIS — F3289 Other specified depressive episodes: Secondary | ICD-10-CM

## 2013-10-26 DIAGNOSIS — F32A Depression, unspecified: Secondary | ICD-10-CM

## 2013-10-26 MED ORDER — ESCITALOPRAM OXALATE 20 MG PO TABS: 20.0000 mg | ORAL_TABLET | Freq: Every day | ORAL | Status: DC

## 2013-10-26 MED ORDER — ESCITALOPRAM OXALATE 10 MG PO TABS: 10.0000 mg | ORAL_TABLET | Freq: Every day | ORAL | Status: DC

## 2013-10-26 NOTE — Addendum Note (Signed)
Addended by: Maurice Small on: 10/26/2013 04:59 PM   Modules accepted: Orders

## 2013-10-26 NOTE — Progress Notes (Signed)
Patient ID: Benjamin Hodge, male   DOB: [birth year], 73 y.o.   MRN: [id number]   Location:  [facility]  Code Status: full code   Allergies  Allergen Reactions  . Ace Inhibitors   . Angiotensin Receptor Blockers   . Bee Venom Swelling  . Cymbalta [Duloxetine Hcl]     Chief Complaint  Patient presents with  . Medical Managment of Chronic Issues    3 month f/u, no recent labs    HPI: Patient is a 73 y.o. white male seen in the office today for medical mgt of chronic diseases.  He was just seen yesterday by NP Karam due to his paronychia of his finger.  She I&d'd it and put him on bactrim DS.    Tells me he had a fall tripping over the edge at the door--fell on deck.  Landed with foot between two chairs.  Didn't realize he hurt his foot at first, but then went to [facility].  Had hit head when he fell also. Had xray from orthopedics of left foot and thumb. Left foot was fractured and was put in a cast--then developed some ulcerations due to the cast  When off balance, if touches wall, gets back into balance.  Says he is dizzy.  Will get to where he is losing his balance during showering when his eyes are closed.  Not a spinning sensation.  Numbness in feet.  No tinnitus.   Says he wants grab bars in the shower to get into shower.  Has shower alone.    Sugars are "getting better".  Went to see Dr. Lucianne Muss.  Using constant glucose monitoring device, but also using meter and compares them.  Is aware that a Davonna Belling will cause a spike.    Has learned to take insulin before his meals.    Says he used to take lexapro in the past when he was depressed and he feels depressed now--says he is not exactly.  Has had thoughts of self-harm, but is not going to do that.  Is not "going to blow his brains out".  Has lost interest in life.  Everything is ho hum.  Doesn't give a damn about anything.  Tried prozac and wellbutrin before w/o benefit.  Is not sure  ritalin helps him anymore--takes only 1 per day lately.   Review of Systems:  Review of Systems  Constitutional: Positive for malaise/fatigue. Negative for fever.  Eyes: Negative for blurred vision.  Respiratory: Negative for cough and shortness of breath.   Cardiovascular: Negative for chest pain.  Gastrointestinal: Negative for abdominal pain.  Genitourinary: Negative for dysuria.  Musculoskeletal: Positive for back pain, falls and joint pain.  Skin: Negative for rash.  Neurological: Positive for dizziness, sensory change and weakness. Negative for loss of consciousness and headaches.       Some word finding difficulty at times  Psychiatric/Behavioral: Positive for depression. Negative for memory loss.     Past Medical History  Diagnosis Date  . Shoulder pain, left   . Malnutrition 11/28/2008  . Right upper lobe pneumonia 11/28/2008  . Anxiety 07/18/2008  . Irritable bowel syndrome 04/27/2008  . Nocturia 03/13/2008  . COPD (chronic obstructive pulmonary disease) 02/27/2008  . B12 deficiency 02/16/2008    in 5/09: B12 262, MMA 1040  . Chronic diarrhea 01/01/2008    s/p EGD 10/06: H pylori + gastritis, duodenal biopsy normal (Dr. Elnoria Howard); s/p colonoscopy 10/06: hemorrhoids (Dr. Elnoria Howard); evaluated by Dr.  Yancey Flemings  in 8/09: tissue transglutaminase AB negative, VIP normal, stool fat content normal, urine 5-HIAA normal; normal GES 11/09 (done for "fluctuating sugars")  . 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 Leg pain, right 09/29/2007  . Fatigue 02/28/2007  . 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 Hyperkalemia 02/09/2007  . Orthostatic hypotension 02/09/2007  . Hypertension 02/09/2007  . Spinal stenosis in cervical region 05/03    MRI  . Erectile dysfunction 09/27/2006    s/p penile implant  . Anemia, iron deficiency 09/27/2006    neg colonoscopy 2006 - Dr. Elnoria Howard; ferritin 152; hgb  15.7  on 07/07  . Hypothyroidism 09/27/2006    TSH 2.639   07/07  . Hypersomnia 09/27/2006    evaluated by Dr. Jetty Duhamel (5/08) and Dr. Vickey Huger; PSG 8/09: chronic delayed sleep phase syndrome (patient sleeps during the day and is awake at night), nocturnal myoclonus (eval for RLS and IDA suggested). Pt advised to change sleeping behavior  . Diabetes mellitus type II 09/07/1984    poorly controlled, complicated by peripheral neuropathy, microalbuminuria, mild non proliferative retinopathy, s/p DKA 7/05, on insulin pump x 4/09, started a pump vacation on 11/19/2008  . Hyperlipidemia   . 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 Hemorrhoids   . Chronic kidney disease   . Unspecified hereditary and idiopathic peripheral neuropathy   . Hyperpotassemia   . Other testicular hypofunction   . Impotence of organic origin   . Other chronic pain   . Other malaise and fatigue   . Unspecified hypothyroidism   . Type II or unspecified type diabetes mellitus with neurological manifestations, not stated as uncontrolled   . Other B-complex deficiencies   . Other organic hypersomnia   . Type I (juvenile type) diabetes mellitus without mention of complication, not stated as uncontrolled   . Depression   . Attention deficit disorder with hyperactivity(314.01)   . Hypertrophy of prostate without urinary obstruction and other lower urinary tract symptoms (LUTS)   . Lumbago   . ADHD, adult residual type 05/31/2013    Past Surgical History  Procedure Laterality Date  . Penile prosthesis implant    . Tonsillectomy    . Colonoscopy  06/25/2005    Dr. Jeani Hawking    Social History:   reports that he quit smoking about 9 years ago. His smoking use included Cigarettes. He smoked 0.00 packs per day. He has never used smokeless tobacco. He reports that he does not drink alcohol or use illicit drugs.  Family History  Problem Relation Age of Onset  . Bone cancer Mother   . Breast cancer Mother   . Cancer Mother   . Lung cancer Father   . Cancer Father   . Breast cancer  Sister   . Cancer Sister   . Lung cancer Brother   . Cancer Sister     type unknown    Medications: Patient's Medications  New Prescriptions   No medications on file  Previous Medications   ASPIRIN 81 MG TABLET    Take 81 mg by mouth every other day.   DIPHENOXYLATE-ATROPINE (LOMOTIL) 2.5-0.025 MG PER TABLET    Take 1 tablet by mouth 3 (three) times daily as needed for diarrhea or loose stools.   FERROUS SULFATE 325 (65 FE) MG TABLET    TAKE ONE TABLET BY MOUTH THREE TIMES DAILY    GLUCOSE BLOOD (ONE TOUCH ULTRA TEST) TEST STRIP    Use to check blood sugars 3 times per day dx code 250.00  HYDROCHLOROTHIAZIDE (HYDRODIURIL) 25 MG TABLET    TAKE ONE TABLET BY MOUTH EVERY DAY FOR BLOOD PRESSURE    HYDROCODONE-ACETAMINOPHEN (NORCO/VICODIN) 5-325 MG PER TABLET    Take one tablet by mouth five times daily as needed to control pain   INSULIN GLARGINE (LANTUS) 100 UNIT/ML SOLOSTAR PEN    Take 7 units in the morning and 3 units at bedtime   INSULIN ISOPHANE HUMAN (HUMULIN N PEN) 100 UNIT/ML KIWKPEN    Inject 6 Units into the skin daily before supper. 4-6 units   INSULIN LISPRO (HUMALOG KWIKPEN) 100 UNIT/ML SOPN    Inject 8-10 Units into the skin 3 (three) times daily with meals.   INSULIN PEN NEEDLE (BD PEN NEEDLE NANO U/F) 32G X 4 MM MISC    1 each by Does not apply route 3 (three) times daily.   LEVOTHYROXINE (SYNTHROID, LEVOTHROID) 75 MCG TABLET    TAKE ONE TABLET BY MOUTH ONE TIME DAILY    METHYLPHENIDATE (RITALIN) 20 MG TABLET    Take 20 mg by mouth daily.   METOPROLOL TARTRATE (LOPRESSOR) 25 MG TABLET    TAKE ONE TABLET BY MOUTH TWICE DAILY    SULFAMETHOXAZOLE-TRIMETHOPRIM (BACTRIM DS) 800-160 MG PER TABLET    Take 1 tablet by mouth 2 (two) times daily.   TESTOSTERONE CYPIONATE (DEPOTESTOTERONE CYPIONATE) 200 MG/ML INJECTION      Modified Medications   Modified Medication Previous Medication   GABAPENTIN (NEURONTIN) 600 MG TABLET gabapentin (NEURONTIN) 600 MG tablet      Take 1/2  tablet  by mouth three times daily    Take one tablet by mouth three times daily  Discontinued Medications   FERROUS SULFATE 325 (65 FE) MG TABLET    TAKE ONE TABLET BY MOUTH THREE TIMES DAILY    GABAPENTIN (NEURONTIN) 100 MG CAPSULE    Take 300 mg by mouth 3 (three) times daily.    SODIUM POLYSTYRENE (KAYEXALATE) 15 GM/60ML SUSPENSION    Take 60 mL (15 g total) by mouth once.     Physical Exam: Filed Vitals:   10/26/13 1537  BP: 146/78  Pulse: 60  Temp: 96.4 F (35.8 C)  TempSrc: Oral  Weight: 135 lb (61.236 kg)  SpO2: 95%  Physical Exam  Constitutional: He is oriented to person, place, and time. He appears well-developed and well-nourished. No distress.  HENT:  Head: Normocephalic and atraumatic.  Cardiovascular: Normal rate, regular rhythm, normal heart sounds and intact distal pulses.   Pulmonary/Chest: Effort normal and breath sounds normal. No respiratory distress.  Abdominal: Soft. Bowel sounds are normal. He exhibits no distension and no mass. There is no tenderness.  Musculoskeletal: Normal range of motion. He exhibits no edema and no tenderness.  Neurological: He is alert and oriented to person, place, and time.  Skin: Skin is warm and dry.  Flushed cheeks;  Left thumb with blister present with edema around it, no drainage present, using bandaid on it;  Scab on dorsum of left foot with erythema around it;  No other skin discoloration and no tenderness of his foot  Psychiatric:  Tangential, pressured speech   Labs reviewed: Basic Metabolic Panel:  Recent Labs  16/10/96 1707 05/03/13 1440 05/22/13 1702 07/20/13 1646  NA 141 140 138 146*  K 6.7* 5.8* 5.5* 5.3*  CL 103 104 105 105  CO2 24 25 28 26   GLUCOSE 292* 237* 396* 53*  BUN 23 19 21 16   CREATININE 1.12 0.91 1.4 1.03  CALCIUM 9.6 8.9 8.3* 9.4  TSH 1.460  --  1.03 1.700   Liver Function Tests:  Recent Labs  11/07/12 1507 01/18/13 1701 04/20/13 1707 05/22/13 1702  AST 57* 20 41* 25  ALT 41 19 40 21    ALKPHOS 61 55 78 56  BILITOT 0.45 0.3 0.4 0.6  PROT 6.4 5.8* 6.7 6.2  ALBUMIN 3.5  --   --  3.7  CBC:  Recent Labs  11/07/12 1507  04/20/13 1707 04/24/13 1511 07/20/13 1646  WBC 6.7  < > 6.8 7.4 6.6  NEUTROABS 4.5  < > 4.8 5.2 4.3  HGB 10.5*  < > 11.6* 10.7* 12.1*  HCT 31.2*  < > 35.7* 31.5* 37.3*  MCV 104.7*  < > 100* 98.1* 101*  PLT 208  --   --  204  --   < > = values in this interval not displayed.  Lab Results  Component Value Date   HGBA1C 7.8* 07/20/2013   Assessment/Plan 1. Abscess of thumb -cont bactrim as ordered -no redness at this point since lancing yesterday, but still has swelling of left thumb -f/u with Shanda BumpsJessica in a week as planned  2. Polyneuropathy in diabetes(357.2) -discussed that this is a huge contributor to his balance problem -cont gabapentin--says he is taking 600mg  at bedtime and 300mg  in the morning right now -liked lyrica better, but too expensive  3. Type I (juvenile type) diabetes mellitus without mention of complication, not stated as uncontrolled -improved lately -cont to follow with Dr. Wyatt PortelaKumar--keep lab appt as schedule with him  4. Depression - start escitalopram (LEXAPRO) 10 MG tablet; Take 1 tablet (10 mg total) by mouth daily.  Dispense: 30 tablet; Refill: 0 for one month - escitalopram (LEXAPRO) 20 MG tablet; Take 1 tablet (20 mg total) by mouth daily.  Dispense: 30 tablet; Refill: 3 thereafter  5. History of fall -occurred on premises of his job stepping onto the deck  6.  Closed fracture of left foot -is to be wearing boot on his left foot, but is not--encouraged to use this, but I don't think he is going to listen b/c it is not painful -is walking normally on it w/o pain -has f/u with ortho, Dr. Ophelia CharterYates in March  Labs/tests ordered:  Has labs ordered by Dr. Lucianne MussKumar for 11/27/13 so none needed by me  Next appt:  4 mos

## 2013-10-27 ENCOUNTER — Ambulatory Visit: Payer: Medicare Other

## 2013-10-27 LAB — AEROBIC CULTURE

## 2013-11-03 ENCOUNTER — Ambulatory Visit: Payer: Medicare Other

## 2013-11-09 ENCOUNTER — Other Ambulatory Visit: Payer: Self-pay | Admitting: *Deleted

## 2013-11-09 DIAGNOSIS — M545 Low back pain, unspecified: Secondary | ICD-10-CM

## 2013-11-09 MED ORDER — HYDROCODONE-ACETAMINOPHEN 5-325 MG PO TABS: ORAL_TABLET | ORAL | Status: DC

## 2013-11-14 ENCOUNTER — Telehealth: Payer: Self-pay | Admitting: Hematology and Oncology

## 2013-11-14 NOTE — Telephone Encounter (Signed)
returned pt call adn r/s missed appt....pt aware of new d.t

## 2013-11-25 ENCOUNTER — Other Ambulatory Visit: Payer: Self-pay | Admitting: Nurse Practitioner

## 2013-11-27 ENCOUNTER — Other Ambulatory Visit (INDEPENDENT_AMBULATORY_CARE_PROVIDER_SITE_OTHER): Payer: Medicare Other

## 2013-11-27 DIAGNOSIS — IMO0002 Reserved for concepts with insufficient information to code with codable children: Secondary | ICD-10-CM

## 2013-11-27 DIAGNOSIS — E039 Hypothyroidism, unspecified: Secondary | ICD-10-CM

## 2013-11-27 DIAGNOSIS — E1065 Type 1 diabetes mellitus with hyperglycemia: Secondary | ICD-10-CM

## 2013-11-27 LAB — T4, FREE: FREE T4: 0.82 ng/dL (ref 0.60–1.60)

## 2013-11-27 LAB — BASIC METABOLIC PANEL
BUN: 28 mg/dL — ABNORMAL HIGH (ref 6–23)
CO2: 28 mEq/L (ref 19–32)
Calcium: 8.8 mg/dL (ref 8.4–10.5)
Chloride: 108 mEq/L (ref 96–112)
Creatinine, Ser: 1.6 mg/dL — ABNORMAL HIGH (ref 0.4–1.5)
GFR: 46.2 mL/min — AB (ref >60.00)
Glucose, Bld: 180 mg/dL — ABNORMAL HIGH (ref 70–99)
POTASSIUM: 5 meq/L (ref 3.5–5.1)
Sodium: 143 mEq/L (ref 135–145)

## 2013-11-27 LAB — MICROALBUMIN / CREATININE URINE RATIO
CREATININE, U: 169.2 mg/dL
MICROALB UR: 2 mg/dL — AB (ref 0.0–1.9)
Microalb Creat Ratio: 1.2 mg/g (ref 0.0–30.0)

## 2013-11-27 LAB — LIPID PANEL
CHOL/HDL RATIO: 3
Cholesterol: 168 mg/dL (ref 0–200)
HDL: 55.5 mg/dL (ref >39.00)
LDL Cholesterol: 89 mg/dL (ref 0–99)
Triglycerides: 118 mg/dL (ref 0.0–149.0)
VLDL: 23.6 mg/dL (ref 0.0–40.0)

## 2013-11-27 LAB — HEMOGLOBIN A1C: HEMOGLOBIN A1C: 7.3 % — AB (ref 4.6–6.5)

## 2013-11-27 LAB — TSH: TSH: 1.06 u[IU]/mL (ref 0.35–5.50)

## 2013-11-29 ENCOUNTER — Telehealth: Payer: Self-pay | Admitting: Hematology and Oncology

## 2013-11-29 ENCOUNTER — Other Ambulatory Visit (HOSPITAL_BASED_OUTPATIENT_CLINIC_OR_DEPARTMENT_OTHER): Payer: Medicare Other

## 2013-11-29 ENCOUNTER — Ambulatory Visit: Payer: Medicare Other | Admitting: Hematology and Oncology

## 2013-11-29 DIAGNOSIS — Z862 Personal history of diseases of the blood and blood-forming organs and certain disorders involving the immune mechanism: Secondary | ICD-10-CM

## 2013-11-29 LAB — CBC WITH DIFFERENTIAL/PLATELET
BASO%: 0.7 % (ref 0.0–2.0)
Basophils Absolute: 0 10*3/uL (ref 0.0–0.1)
EOS ABS: 0.3 10*3/uL (ref 0.0–0.5)
EOS%: 5.5 % (ref 0.0–7.0)
HCT: 34.4 % — ABNORMAL LOW (ref 38.4–49.9)
HGB: 11.2 g/dL — ABNORMAL LOW (ref 13.0–17.1)
LYMPH%: 16.6 % (ref 14.0–49.0)
MCH: 33.2 pg (ref 27.2–33.4)
MCHC: 32.5 g/dL (ref 32.0–36.0)
MCV: 102.1 fL — ABNORMAL HIGH (ref 79.3–98.0)
MONO#: 0.5 10*3/uL (ref 0.1–0.9)
MONO%: 9.6 % (ref 0.0–14.0)
NEUT%: 67.6 % (ref 39.0–75.0)
NEUTROS ABS: 3.6 10*3/uL (ref 1.5–6.5)
PLATELETS: 215 10*3/uL (ref 140–400)
RBC: 3.37 10*6/uL — AB (ref 4.20–5.82)
RDW: 13.4 % (ref 11.0–14.6)
WBC: 5.3 10*3/uL (ref 4.0–10.3)
lymph#: 0.9 10*3/uL (ref 0.9–3.3)

## 2013-11-29 LAB — COMPREHENSIVE METABOLIC PANEL (CC13)
ALT: 20 U/L (ref 0–55)
ANION GAP: 9 meq/L (ref 3–11)
AST: 22 U/L (ref 5–34)
Albumin: 3.9 g/dL (ref 3.5–5.0)
Alkaline Phosphatase: 65 U/L (ref 40–150)
BILIRUBIN TOTAL: 0.46 mg/dL (ref 0.20–1.20)
BUN: 20.9 mg/dL (ref 7.0–26.0)
CHLORIDE: 108 meq/L (ref 98–109)
CO2: 26 meq/L (ref 22–29)
Calcium: 9.3 mg/dL (ref 8.4–10.4)
Creatinine: 1.1 mg/dL (ref 0.7–1.3)
GLUCOSE: 156 mg/dL — AB (ref 70–140)
Potassium: 5 mEq/L (ref 3.5–5.1)
SODIUM: 143 meq/L (ref 136–145)
TOTAL PROTEIN: 6.6 g/dL (ref 6.4–8.3)

## 2013-11-29 NOTE — Telephone Encounter (Signed)
pt couldnot stay for appt today and wanted to r/s....done....printed pt new avs

## 2013-11-29 NOTE — Telephone Encounter (Signed)
pt couldnot stay for appt today and wanted to r/s....done....printed pt new avs °

## 2013-11-30 LAB — FERRITIN CHCC: Ferritin: 477 ng/ml — ABNORMAL HIGH (ref 22–316)

## 2013-12-01 ENCOUNTER — Other Ambulatory Visit: Payer: Self-pay | Admitting: Internal Medicine

## 2013-12-01 ENCOUNTER — Ambulatory Visit (INDEPENDENT_AMBULATORY_CARE_PROVIDER_SITE_OTHER): Payer: Medicare Other | Admitting: Endocrinology

## 2013-12-01 ENCOUNTER — Encounter: Payer: Self-pay | Admitting: Endocrinology

## 2013-12-01 VITALS — BP 126/60 | HR 78 | Temp 98.3°F | Resp 14 | Ht 65.0 in | Wt 124.2 lb

## 2013-12-01 DIAGNOSIS — E1065 Type 1 diabetes mellitus with hyperglycemia: Secondary | ICD-10-CM

## 2013-12-01 DIAGNOSIS — IMO0002 Reserved for concepts with insufficient information to code with codable children: Secondary | ICD-10-CM

## 2013-12-01 DIAGNOSIS — E039 Hypothyroidism, unspecified: Secondary | ICD-10-CM

## 2013-12-01 NOTE — Progress Notes (Signed)
Patient ID: Galvin ProfferRobert W Gasparro, male   DOB: August 26, 1941, 73 y.o.   MRN: [id number]   Reason for Appointment : Follow up for Type 1 Diabetes  History of Present Illness          Diagnosis: Type 1 diabetes mellitus, date of diagnosis: 1986         Past history: Patient has had very labile diabetes and generally difficult to control for several years. He had previously been tried on an Accu-Chek pump also which did not appear to be unofficial and he was not very keen on continuing Has been tried on basal bolus insulin regimen for several years. His insulin has been changed continuously without achieving good control. Has been tried on Levemir instead of Lantus without any significant benefit. Also because of tendency to markedly increased blood sugars overnight he has been started on NPH insulin at bedtime along with his twice a day Lantus  INSULIN regimen is described as: LANTUS 4 units at 2 am and 7 units at breakfast.  Humalog 1:20 carbohydrate coverage, correction 1: 50-100  Recent history:  His blood sugars appear to be overall much higher than the last time with a median of 239 compared to 154 Fasting blood sugars have increased in the last week significantly for no apparent reason He was started back on NPH insulin at bedtime to control overnight hypoglycemia; Lantus was also reduced at night. A1c is slightly better Current blood sugar patterns:  Fasting blood sugars are consistently high in the last week although was having relatively good readings about middle of the month.  Blood sugars are variable after breakfast But generally lower than before eating  Blood sugars are again mostly high in the evening around 10 PM-midnight  Blood sugars in the early part of the evening are relatively lower but inconsistent; even though he is not very active   Overall has variability in blood sugars at all different times   Factors causing hyperglycemia: eating relatively high fat meal at night  and not taking enough mealtime coverage. He was told to take 1:10 coverage but is taking only 1:20 coverage for fear of hypoglycemia. Also taking less than the calculated carbohydrate coverage at breakfast usually. Not clear why his blood sugars are sometimes high at 10- 11 PM even though he is covering his evening snacks and occasional alcoholic drinks He was prescribed a continuous glucose monitor but not clear why he did not continue using this. He needed significant help with getting this started and this helped identify his high patterns.  Glucose monitoring:  done 3.2 times a day         Glucometer: One Touch.      Blood Glucose readings from meter download:   PREMEAL Breakfast  6 p.m.  Dinner Bedtime Overall  Glucose range:  67-366   95-433   108- 384 ?   62-433   Mean/median:      239    POST-MEAL PC Breakfast PC Lunch PC Dinner  Glucose range:  132-409   ?   Mean/median:       Self-care: The diet that the patient has been following is: Usually eating frozen meal at dinnertime  Meals: 2 meals per day.  breakfast is high fat with fruit, usually 60 g carb  Physical activity: exercise: No programmed activity.           Wt Readings from Last 3 Encounters:  12/01/13 124 lb 3.2 oz (56.337 kg)  10/26/13 135 lb (61.236 kg)  10/25/13 135 lb 9.6 oz (61.508 kg)   Lab Results  Component Value Date   HGBA1C 7.3* 11/27/2013   HGBA1C 7.8* 07/20/2013   HGBA1C 9.4* 04/20/2013   Lab Results  Component Value Date   MICROALBUR 2.0* 11/27/2013   LDLCALC 89 11/27/2013   CREATININE 1.1 11/29/2013       Medication List       This list is accurate as of: 12/01/13  4:46 PM.  Always use your most recent med list.               aspirin 81 MG tablet  Take 81 mg by mouth every other day.     diphenoxylate-atropine 2.5-0.025 MG per tablet  Commonly known as:  LOMOTIL  Take 1 tablet by mouth 3 (three) times daily as needed for diarrhea or loose stools.     escitalopram 10 MG tablet   Commonly known as:  LEXAPRO  Take 1 tablet (10 mg total) by mouth daily.     escitalopram 20 MG tablet  Commonly known as:  LEXAPRO  Take 1 tablet (20 mg total) by mouth daily.     ferrous sulfate 325 (65 FE) MG tablet  TAKE ONE TABLET BY MOUTH THREE TIMES DAILY     gabapentin 600 MG tablet  Commonly known as:  NEURONTIN  Take 1/2  tablet by mouth three times daily     glucose blood test strip  Commonly known as:  ONE TOUCH ULTRA TEST  Use to check blood sugars 3 times per day dx code 250.00     hydrochlorothiazide 25 MG tablet  Commonly known as:  HYDRODIURIL  TAKE ONE TABLET BY MOUTH ONE TIME DAILY for blood pressure     HYDROcodone-acetaminophen 5-325 MG per tablet  Commonly known as:  NORCO/VICODIN  Take one tablet by mouth five times daily as needed to control pain     Insulin Glargine 100 UNIT/ML Solostar Pen  Commonly known as:  LANTUS  Take 7 units in the morning and 3 units at bedtime     Insulin Isophane Human 100 UNIT/ML Kiwkpen  Commonly known as:  HUMULIN N PEN  Inject 6 Units into the skin daily before supper. 4-6 units     insulin lispro 100 UNIT/ML KiwkPen  Commonly known as:  HUMALOG KWIKPEN  Inject 8-10 Units into the skin 3 (three) times daily with meals.     Insulin Pen Needle 32G X 4 MM Misc  Commonly known as:  BD PEN NEEDLE NANO U/F  1 each by Does not apply route 3 (three) times daily.     levothyroxine 75 MCG tablet  Commonly known as:  SYNTHROID, LEVOTHROID  TAKE ONE TABLET BY MOUTH ONE TIME DAILY     methylphenidate 20 MG tablet  Commonly known as:  RITALIN  Take 20 mg by mouth daily.     metoprolol tartrate 25 MG tablet  Commonly known as:  LOPRESSOR  TAKE ONE TABLET BY MOUTH TWICE DAILY     sulfamethoxazole-trimethoprim 800-160 MG per tablet  Commonly known as:  BACTRIM DS  Take 1 tablet by mouth 2 (two) times daily.     testosterone cypionate 200 MG/ML injection  Commonly known as:  DEPOTESTOTERONE CYPIONATE         Allergies:  Allergies  Allergen Reactions  . Ace Inhibitors   . Angiotensin Receptor Blockers   . Bee Venom Swelling  . Cymbalta [Duloxetine Hcl]     Past Medical History  Diagnosis Date  .  Shoulder pain, left   . Malnutrition 11/28/2008  . Right upper lobe pneumonia 11/28/2008  . Anxiety 07/18/2008  . Irritable bowel syndrome 04/27/2008  . Nocturia 03/13/2008  . COPD (chronic obstructive pulmonary disease) 02/27/2008  . B12 deficiency 02/16/2008    in 5/09: B12 262, MMA 1040  . Chronic diarrhea 01/01/2008    s/p EGD 10/06: H pylori + gastritis, duodenal biopsy normal (Dr. Elnoria Howard); s/p colonoscopy 10/06: hemorrhoids (Dr. Elnoria Howard); evaluated by Dr. Yancey Flemings  in 8/09: tissue transglutaminase AB negative, VIP normal, stool fat content normal, urine 5-HIAA normal; normal GES 11/09 (done for "fluctuating sugars")  . 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 Leg pain, right 09/29/2007  . Fatigue 02/28/2007  . 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 Hyperkalemia 02/09/2007  . Orthostatic hypotension 02/09/2007  . Hypertension 02/09/2007  . Spinal stenosis in cervical region 05/03    MRI  . Erectile dysfunction 09/27/2006    s/p penile implant  . Anemia, iron deficiency 09/27/2006    neg colonoscopy 2006 - Dr. Elnoria Howard; ferritin 152; hgb  15.7  on 07/07  . Hypothyroidism 09/27/2006    TSH 2.639  07/07  . Hypersomnia 09/27/2006    evaluated by Dr. Jetty Duhamel (5/08) and Dr. Vickey Huger; PSG 8/09: chronic delayed sleep phase syndrome (patient sleeps during the day and is awake at night), nocturnal myoclonus (eval for RLS and IDA suggested). Pt advised to change sleeping behavior  . Diabetes mellitus type II 09/07/1984    poorly controlled, complicated by peripheral neuropathy, microalbuminuria, mild non proliferative retinopathy, s/p DKA 7/05, on insulin pump x 4/09, started a pump vacation on 11/19/2008  . Hyperlipidemia   . 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  Hemorrhoids   . Chronic kidney disease   . Unspecified hereditary and idiopathic peripheral neuropathy   . Hyperpotassemia   . Other testicular hypofunction   . Impotence of organic origin   . Other chronic pain   . Other malaise and fatigue   . Unspecified hypothyroidism   . Type II or unspecified type diabetes mellitus with neurological manifestations, not stated as uncontrolled   . Other B-complex deficiencies   . Other organic hypersomnia   . Type I (juvenile type) diabetes mellitus without mention of complication, not stated as uncontrolled   . Depression   . Attention deficit disorder with hyperactivity(314.01)   . Hypertrophy of prostate without urinary obstruction and other lower urinary tract symptoms (LUTS)   . Lumbago   . ADHD, adult residual type 05/31/2013    Past Surgical History  Procedure Laterality Date  . Penile prosthesis implant    . Tonsillectomy    . Colonoscopy  06/25/2005    Dr. Jeani Hawking    Family History  Problem Relation Age of Onset  . Bone cancer Mother   . Breast cancer Mother   . Cancer Mother   . Lung cancer Father   . Cancer Father   . Breast cancer Sister   . Cancer Sister   . Lung cancer Brother   . Cancer Sister     type unknown    Social History:  reports that he quit smoking about 9 years ago. His smoking use included Cigarettes. He smoked 0.00 packs per day. He has never used smokeless tobacco. He reports that he does not drink alcohol or use illicit drugs.    Review of Systems:   ANEMIA: His hemoglobin  Has been low and this is followed by hematologist   He has been diagnosed to  have hypogonadism and is  being treated by his urologist with injections every 2 weeks He has had mild hypertension followed by PCP History of chronic diarrhea of unclear etiology Has had BPH with significant nocturia  HYPERKALEMIA: Potassium was high previously and is normal now   History of hypothyroidism, currently taking 75 mcg   Lab  Results  Component Value Date   TSH 1.06 11/27/2013   Has good control of his lipids  Lab Results  Component Value Date   CHOL 168 11/27/2013   HDL 55.50 11/27/2013   LDLCALC 89 11/27/2013   TRIG 118.0 11/27/2013   CHOLHDL 3 11/27/2013    Physical Examination:  BP 126/60  Pulse 78  Temp(Src) 98.3 F (36.8 C)  Resp 14  Ht 5\' 5"  (1.651 m)  Wt 124 lb 3.2 oz (56.337 kg)  BMI 20.67 kg/m2  SpO2 94%      No pedal edema    ASSESSMENT:  Diabetes type 1:   His blood sugars are not well controlled especially recently See history of present illness for current discussion of his blood sugar patterns and reasons for poor control  His glucose record is showing the  patterns of higher fasting readings and also late in the evening Not clear if he is getting a relatively flat affect of Lantus insulin but he thinks there may also did not help previously He is having hyperglycemia related to following factors:  Usually will have higher fat meals at night which are not adequately covered  Probably not covering some of his morning meals which are also high fat although postprandial readings in the mornings are variable  Not getting enough Lantus insulin overnight recently  Variable action profile of Lantus insulin Not using his continuous glucose monitoring makes it difficult to analyze his patterns today His A1c is however somewhat improved  PLAN:    Increase evening Lantus to 4 units. May need to go up to 5 units if fasting blood sugar is still over 150.  Increase coverage for breakfast to 3 units Humalog  Calculate supper time dose based on 1:15 g carbohydrate ratio along with correction factor  Try to check readings 2 hours after meals especially after nighttime meal  Start back using continuous glucose sensor with the help of nurse educator  Detailed instructions written out for patient  Hypothyroidism: Adequately replaced  Counseling time over 50% of today's 25 minute  visit  Kenecia Barren 12/01/2013, 4:46 PM

## 2013-12-01 NOTE — Patient Instructions (Addendum)
Lantus 4 units at night, Humulin 4 units  If am sugar stays >150, then 5 Lantus at night  Humalog 1: 15 g carbs at dinner;  Try 3  for breakfast  Check some sugars around  3-5 am or 2 hrs after any meal <200  Start Dexcom

## 2013-12-04 ENCOUNTER — Other Ambulatory Visit: Payer: Self-pay | Admitting: *Deleted

## 2013-12-04 MED ORDER — LEVOTHYROXINE SODIUM 75 MCG PO TABS: ORAL_TABLET | ORAL | Status: DC

## 2013-12-04 NOTE — Telephone Encounter (Signed)
[facility] 

## 2013-12-05 ENCOUNTER — Encounter: Payer: Self-pay | Admitting: Internal Medicine

## 2013-12-05 ENCOUNTER — Ambulatory Visit (INDEPENDENT_AMBULATORY_CARE_PROVIDER_SITE_OTHER): Payer: Medicare Other | Admitting: Internal Medicine

## 2013-12-05 VITALS — BP 135/78 | HR 73 | Resp 10 | Wt 126.0 lb

## 2013-12-05 DIAGNOSIS — L0291 Cutaneous abscess, unspecified: Secondary | ICD-10-CM

## 2013-12-05 DIAGNOSIS — L0390 Cellulitis, unspecified: Secondary | ICD-10-CM

## 2013-12-05 DIAGNOSIS — M545 Low back pain, unspecified: Secondary | ICD-10-CM

## 2013-12-05 MED ORDER — HYDROCODONE-ACETAMINOPHEN 5-325 MG PO TABS: ORAL_TABLET | ORAL | Status: DC

## 2013-12-05 MED ORDER — DOXYCYCLINE HYCLATE 100 MG PO TABS: 100.0000 mg | ORAL_TABLET | Freq: Two times a day (BID) | ORAL | Status: DC

## 2013-12-05 NOTE — Progress Notes (Signed)
Patient ID: Benjamin Hodge, male   DOB: 1941/05/06, 73 y.o.   MRN: [id number]    Chief Complaint  Patient presents with  . Foot Problem    Right (toenail off on 4th toe) and Left foot concerns(blisters).    Allergies  Allergen Reactions  . Ace Inhibitors   . Angiotensin Receptor Blockers   . Bee Venom Swelling  . Cymbalta [Duloxetine Hcl]    HPI 73 y/o male patient is here for concerns of blister in his left foot and toe nail of his right 4th toe that broke few days back. He has history of diabetes. He sees podiatry. He had blisters in his left foot intermittently last week and now has noted blotches of redness in his foot. Denies any pain  ROS No fever or chills Has hx of I/D on his thumb before and hx of fractur in left foot and wearing una boot for sometime.  Has hx of dry skin  Past Medical History  Diagnosis Date  . Shoulder pain, left   . Malnutrition 11/28/2008  . Right upper lobe pneumonia 11/28/2008  . Anxiety 07/18/2008  . Irritable bowel syndrome 04/27/2008  . Nocturia 03/13/2008  . COPD (chronic obstructive pulmonary disease) 02/27/2008  . B12 deficiency 02/16/2008    in 5/09: B12 262, MMA 1040  . Chronic diarrhea 01/01/2008    s/p EGD 10/06: H pylori + gastritis, duodenal biopsy normal (Dr. Elnoria HowardHung); s/p colonoscopy 10/06: hemorrhoids (Dr. Elnoria HowardHung); evaluated by Dr. Yancey FlemingsJohn Perry  in 8/09: tissue transglutaminase AB negative, VIP normal, stool fat content normal, urine 5-HIAA normal; normal GES 11/09 (done for "fluctuating sugars")  . 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 Leg pain, right 09/29/2007  . Fatigue 02/28/2007  . 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 Hyperkalemia 02/09/2007  . Orthostatic hypotension 02/09/2007  . Hypertension 02/09/2007  . Spinal stenosis in cervical region 05/03    MRI  . Erectile dysfunction 09/27/2006    s/p penile implant  . Anemia, iron deficiency 09/27/2006    neg colonoscopy 2006 - Dr. Elnoria HowardHung;  ferritin 152; hgb  15.7  on 07/07  . Hypothyroidism 09/27/2006    TSH 2.639  07/07  . Hypersomnia 09/27/2006    evaluated by Dr. Jetty Duhamellinton Young (5/08) and Dr. Vickey Hugerohmeier; PSG 8/09: chronic delayed sleep phase syndrome (patient sleeps during the day and is awake at night), nocturnal myoclonus (eval for RLS and IDA suggested). Pt advised to change sleeping behavior  . Diabetes mellitus type II 09/07/1984    poorly controlled, complicated by peripheral neuropathy, microalbuminuria, mild non proliferative retinopathy, s/p DKA 7/05, on insulin pump x 4/09, started a pump vacation on 11/19/2008  . Hyperlipidemia   . 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 Hemorrhoids   . Chronic kidney disease   . Unspecified hereditary and idiopathic peripheral neuropathy   . Hyperpotassemia   . Other testicular hypofunction   . Impotence of organic origin   . Other chronic pain   . Other malaise and fatigue   . Unspecified hypothyroidism   . Type II or unspecified type diabetes mellitus with neurological manifestations, not stated as uncontrolled   . Other B-complex deficiencies   . Other organic hypersomnia   . Type I (juvenile type) diabetes mellitus without mention of complication, not stated as uncontrolled   . Depression   . Attention deficit disorder with hyperactivity(314.01)   . Hypertrophy of prostate without urinary obstruction and other lower urinary tract symptoms (LUTS)   . Lumbago   . ADHD, adult  residual type 05/31/2013   Current Outpatient Prescriptions on File Prior to Visit  Medication Sig Dispense Refill  . aspirin 81 MG tablet Take 81 mg by mouth daily.       . diphenoxylate-atropine (LOMOTIL) 2.5-0.025 MG per tablet Take 1 tablet by mouth 3 (three) times daily as needed for diarrhea or loose stools.  90 tablet  1  . escitalopram (LEXAPRO) 20 MG tablet Take 1 tablet (20 mg total) by mouth daily.  30 tablet  3  . ferrous sulfate 325 (65 FE) MG tablet TAKE ONE TABLET BY MOUTH THREE TIMES DAILY   90  tablet  3  . gabapentin (NEURONTIN) 600 MG tablet Take 1/2  tablet by mouth three times daily      . glucose blood (ONE TOUCH ULTRA TEST) test strip Use to check blood sugars 3 times per day dx code 250.00  100 each  3  . hydrochlorothiazide (HYDRODIURIL) 25 MG tablet TAKE ONE TABLET BY MOUTH ONE TIME DAILY for blood pressure  30 tablet  5  . Insulin Glargine (LANTUS) 100 UNIT/ML Solostar Pen Take 7 units in the morning and 3 units at bedtime      . Insulin Pen Needle (BD PEN NEEDLE NANO U/F) 32G X 4 MM MISC 1 each by Does not apply route 3 (three) times daily.  100 each  5  . levothyroxine (SYNTHROID, LEVOTHROID) 75 MCG tablet Take one tablet by mouth once daily for thyroid  30 tablet  5  . methylphenidate (RITALIN) 20 MG tablet Take 20 mg by mouth 2 (two) times daily with breakfast and lunch.       . metoprolol tartrate (LOPRESSOR) 25 MG tablet TAKE ONE TABLET BY MOUTH TWICE DAILY   60 tablet  3  . testosterone cypionate (DEPOTESTOTERONE CYPIONATE) 200 MG/ML injection Every 2 weeks, administered by Urologist       No current facility-administered medications on file prior to visit.   Past Surgical History  Procedure Laterality Date  . Penile prosthesis implant    . Tonsillectomy    . Colonoscopy  06/25/2005    Dr. Jeani Hawking   Physical exam BP 135/78  Pulse 73  Resp 10  Wt 126 lb (57.153 kg)  SpO2 92%  Constitutional: He is oriented to person, place, and time.  HENT:   Head: Normocephalic and atraumatic.  Cardiovascular: Normal rate, regular rhythm, normal heart sounds and intact distal pulses.   Pulmonary/Chest: Effort normal and breath sounds normal. No respiratory distress.  Abdominal: Soft. Bowel sounds are normal. He exhibits no distension and no mass. There is no tenderness.  Musculoskeletal: Normal range of motion. He exhibits trace left foot edema. Right 4 th toe nail partially broken, no signs of infection Neurological: He is alert and oriented to person, place, and  time.  Skin: Skin is warm and dry. Left foot healing wound and has hemorrhagic skin kind of appearance on medial forefoot area. No open skin areas. Excessively dry skin. Scab on dorsum of left foot.  Assessment/plan  1. Cellulitis Curb sided Dr Chilton Si to take a look at the skin area to rule out shingles. This appears more to be bacterial skin infection, likely from staphylococcus. Will have him on doxycycline 100 mg bid for 10 days. Advised to apply vaseline or aquaphor to his feet as well. Reassess if no improvement  2. Low back pain potentially associated with spinal stenosis - HYDROcodone-acetaminophen (NORCO/VICODIN) 5-325 MG per tablet; Take one tablet by mouth five times daily as  needed to control pain  Dispense: 150 tablet; Refill: 0,ed

## 2013-12-12 ENCOUNTER — Encounter: Payer: Self-pay | Admitting: Hematology and Oncology

## 2013-12-12 ENCOUNTER — Ambulatory Visit (HOSPITAL_BASED_OUTPATIENT_CLINIC_OR_DEPARTMENT_OTHER): Payer: Medicare Other | Admitting: Hematology and Oncology

## 2013-12-12 ENCOUNTER — Other Ambulatory Visit: Payer: Self-pay | Admitting: Internal Medicine

## 2013-12-12 VITALS — BP 136/75 | HR 80 | Temp 97.6°F | Resp 18 | Ht 65.0 in | Wt 126.7 lb

## 2013-12-12 DIAGNOSIS — R718 Other abnormality of red blood cells: Secondary | ICD-10-CM

## 2013-12-12 DIAGNOSIS — D638 Anemia in other chronic diseases classified elsewhere: Secondary | ICD-10-CM

## 2013-12-12 DIAGNOSIS — D649 Anemia, unspecified: Secondary | ICD-10-CM

## 2013-12-12 DIAGNOSIS — E538 Deficiency of other specified B group vitamins: Secondary | ICD-10-CM

## 2013-12-12 NOTE — Progress Notes (Signed)
[facility] OFFICE PROGRESS NOTE  Benjamin, [name], DO DIAGNOSIS:  Chronic anemia with history of B12 deficiency  SUMMARY OF HEMATOLOGIC HISTORY: This patient had mutifactorial anemia with prior iron deficiency on chronic iron supplement. He also has history of B12 deficiency, treated in the past. He is getting testosterone injection for hypogonadism and has anemia of chronic disease with diabetes and recurrent wound infections INTERVAL HISTORY: Benjamin Hodge 73 y.o. male returns for further follow-up. He has no symptoms of anemia. The patient denies any recent signs or symptoms of bleeding such as spontaneous epistaxis, hematuria or hematochezia. He just completed a course of oral antibiotics for leg infection.  I have reviewed the past medical history, past surgical history, social history and family history with the patient and they are unchanged from previous note.  ALLERGIES:  is allergic to ace inhibitors; angiotensin receptor blockers; bee venom; and cymbalta.  MEDICATIONS:  Current Outpatient Prescriptions  Medication Sig Dispense Refill  . aspirin 81 MG tablet Take 81 mg by mouth daily.       . diphenoxylate-atropine (LOMOTIL) 2.5-0.025 MG per tablet Take 1 tablet by mouth 3 (three) times daily as needed for diarrhea or loose stools.  90 tablet  1  . doxycycline (VIBRA-TABS) 100 MG tablet Take 1 tablet (100 mg total) by mouth 2 (two) times daily.  20 tablet  0  . escitalopram (LEXAPRO) 20 MG tablet Take 1 tablet (20 mg total) by mouth daily.  30 tablet  3  . ferrous sulfate 325 (65 FE) MG tablet TAKE ONE TABLET BY MOUTH THREE TIMES DAILY   90 tablet  3  . gabapentin (NEURONTIN) 600 MG tablet Take 1/2  tablet by mouth three times daily      . glucose blood (ONE TOUCH ULTRA TEST) test strip Use to check blood sugars 3 times per day dx code 250.00  100 each  3  . hydrochlorothiazide (HYDRODIURIL) 25 MG tablet TAKE ONE TABLET BY MOUTH ONE TIME DAILY for blood  pressure  30 tablet  5  . HYDROcodone-acetaminophen (NORCO/VICODIN) 5-325 MG per tablet Take one tablet by mouth five times daily as needed to control pain  150 tablet  0  . Insulin Glargine (LANTUS) 100 UNIT/ML Solostar Pen Take 7 units in the morning and 3 units at bedtime      . Insulin Isophane Human (HUMULIN N) 100 UNIT/ML Kiwkpen Inject 4 Units into the skin daily before supper. 4-6 units      . insulin lispro (HUMALOG) 100 UNIT/ML KiwkPen Inject 8-10 Units into the skin daily as needed.      . Insulin Pen Needle (BD PEN NEEDLE NANO U/F) 32G X 4 MM MISC 1 each by Does not apply route 3 (three) times daily.  100 each  5  . levothyroxine (SYNTHROID, LEVOTHROID) 75 MCG tablet Take one tablet by mouth once daily for thyroid  30 tablet  5  . methylphenidate (RITALIN) 20 MG tablet Take 20 mg by mouth 2 (two) times daily with breakfast and lunch.       . metoprolol tartrate (LOPRESSOR) 25 MG tablet TAKE ONE TABLET BY MOUTH TWICE DAILY   60 tablet  3  . testosterone cypionate (DEPOTESTOTERONE CYPIONATE) 200 MG/ML injection Every 2 weeks, administered by Urologist       No current facility-administered medications for this visit.     REVIEW OF SYSTEMS:   Constitutional: Denies fevers, chills or night sweats Eyes: Denies blurriness of vision Ears, nose, mouth, throat, and  face: Denies mucositis or sore throat All other systems were reviewed with the patient and are negative.  PHYSICAL EXAMINATION: ECOG PERFORMANCE STATUS: 1 - Symptomatic but completely ambulatory  Filed Vitals:   12/12/13 1535  BP: 136/75  Pulse: 80  Temp: 97.6 F (36.4 C)  Resp: 18   Filed Weights   12/12/13 1535  Weight: 126 lb 11.2 oz (57.471 kg)    GENERAL:alert, no distress and comfortable Musculoskeletal:no cyanosis of digits and no clubbing  NEURO: alert & oriented x 3 with fluent speech, no focal motor/sensory deficits  LABORATORY DATA:  I have reviewed the data as listed No results found for this or any  previous visit (from the past 48 hour(s)).  Lab Results  Component Value Date   WBC 5.3 11/29/2013   HGB 11.2* 11/29/2013   HCT 34.4* 11/29/2013   MCV 102.1* 11/29/2013   PLT 215 11/29/2013   ASSESSMENT & PLAN:  #1 Chronic anemia #2 Poorly controlled diabetes with recurrent wound infections #3 History of iron deficiency, resolved #4 Macrocytosis, suspect B12 deficiency #5 Hypogonadism, on chronic replacement therapy  This is likely anemia of chronic disease. The patient denies recent history of bleeding such as epistaxis, hematuria or hematochezia. He is asymptomatic from the anemia. We will observe for now.  I told him to stop his iron supplement The macrocytic anemia seen now could be related to Vitamin B12 deficiency. I recommend oral vitamin B12 supplement. The testosterone supplement will likely help with his chronic anemia. The patient sees his PCP regularly. I do not recommend any further work-up and will discharge him from the clinic All questions were answered. The patient knows to call the clinic with any problems, questions or concerns. No barriers to learning was detected.  I spent 15 minutes counseling the patient face to face. The total time spent in the appointment was 20 minutes and more than 50% was on counseling.     [facility] LPGORSUCH, Brodric Schauer, MD 12/12/2013 9:58 PM

## 2013-12-14 ENCOUNTER — Other Ambulatory Visit: Payer: Self-pay | Admitting: Endocrinology

## 2013-12-14 DIAGNOSIS — IMO0002 Reserved for concepts with insufficient information to code with codable children: Secondary | ICD-10-CM

## 2013-12-14 DIAGNOSIS — E1065 Type 1 diabetes mellitus with hyperglycemia: Secondary | ICD-10-CM

## 2013-12-20 ENCOUNTER — Encounter: Payer: Medicare Other | Attending: Endocrinology | Admitting: Nutrition

## 2013-12-20 DIAGNOSIS — Z713 Dietary counseling and surveillance: Secondary | ICD-10-CM | POA: Insufficient documentation

## 2013-12-20 DIAGNOSIS — E119 Type 2 diabetes mellitus without complications: Secondary | ICD-10-CM | POA: Insufficient documentation

## 2013-12-29 ENCOUNTER — Ambulatory Visit: Payer: Medicare Other | Admitting: Nurse Practitioner

## 2013-12-30 ENCOUNTER — Other Ambulatory Visit: Payer: Self-pay | Admitting: Internal Medicine

## 2014-01-04 ENCOUNTER — Ambulatory Visit (INDEPENDENT_AMBULATORY_CARE_PROVIDER_SITE_OTHER): Payer: Medicare Other | Admitting: Nurse Practitioner

## 2014-01-04 ENCOUNTER — Encounter: Payer: Self-pay | Admitting: Nurse Practitioner

## 2014-01-04 VITALS — BP 127/70 | HR 64 | Temp 97.5°F | Wt 127.2 lb

## 2014-01-04 DIAGNOSIS — L0291 Cutaneous abscess, unspecified: Secondary | ICD-10-CM

## 2014-01-04 DIAGNOSIS — L0390 Cellulitis, unspecified: Secondary | ICD-10-CM

## 2014-01-04 MED ORDER — METOPROLOL TARTRATE 25 MG PO TABS: ORAL_TABLET | ORAL | Status: DC

## 2014-01-04 NOTE — Progress Notes (Signed)
Patient ID: Benjamin Hodge, male   DOB: 1941-01-20, 73 y.o.   MRN: [id number]    Allergies  Galvin Profferllergen Reactions  . Ace Inhibitors   . Angiotensin Receptor Blockers   . Bee Venom Swelling  . Cymbalta [Duloxetine Hcl]     Chief Complaint  Patient presents with  . Follow-up    1 month f/u    HPI: Patient is a 73 y.o.  male seen in the office today for follow up cellulitis on left foot from last month, completed treatment of doxycyline, now has resolved but left with small scarred area, no pain or swelling.  Review of Systems:   Review of Systems  Constitutional: Negative for fever, chills and malaise/fatigue.  Respiratory: Negative for cough and shortness of breath.   Cardiovascular: Negative for leg swelling.  Gastrointestinal: Negative for diarrhea and constipation.  Musculoskeletal: Negative for joint pain and myalgias.  Skin: Negative for itching and rash.  Neurological: Negative for weakness.     Past Medical History  Diagnosis Date  . Shoulder pain, left   . Malnutrition 11/28/2008  . Right upper lobe pneumonia 11/28/2008  . Anxiety 07/18/2008  . Irritable bowel syndrome 04/27/2008  . Nocturia 03/13/2008  . COPD (chronic obstructive pulmonary disease) 02/27/2008  . B12 deficiency 02/16/2008    in 5/09: B12 262, MMA 1040  . Chronic diarrhea 01/01/2008    s/p EGD 10/06: H pylori + gastritis, duodenal biopsy normal (Dr. Elnoria HowardHung); s/p colonoscopy 10/06: hemorrhoids (Dr. Elnoria HowardHung); evaluated by Dr. Yancey FlemingsJohn Perry  in 8/09: tissue transglutaminase AB negative, VIP normal, stool fat content normal, urine 5-HIAA normal; normal GES 11/09 (done for "fluctuating sugars")  . 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 Leg pain, right 09/29/2007  . Fatigue 02/28/2007  . 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 Hyperkalemia 02/09/2007  . Orthostatic hypotension 02/09/2007  . Hypertension 02/09/2007  . Spinal stenosis in cervical region 05/03    MRI  .  Erectile dysfunction 09/27/2006    s/p penile implant  . Anemia, iron deficiency 09/27/2006    neg colonoscopy 2006 - Dr. Elnoria HowardHung; ferritin 152; hgb  15.7  on 07/07  . Hypothyroidism 09/27/2006    TSH 2.639  07/07  . Hypersomnia 09/27/2006    evaluated by Dr. Jetty Duhamellinton Young (5/08) and Dr. Vickey Hugerohmeier; PSG 8/09: chronic delayed sleep phase syndrome (patient sleeps during the day and is awake at night), nocturnal myoclonus (eval for RLS and IDA suggested). Pt advised to change sleeping behavior  . Diabetes mellitus type II 09/07/1984    poorly controlled, complicated by peripheral neuropathy, microalbuminuria, mild non proliferative retinopathy, s/p DKA 7/05, on insulin pump x 4/09, started a pump vacation on 11/19/2008  . Hyperlipidemia   . 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 Hemorrhoids   . Chronic kidney disease   . Unspecified hereditary and idiopathic peripheral neuropathy   . Hyperpotassemia   . Other testicular hypofunction   . Impotence of organic origin   . Other chronic pain   . Other malaise and fatigue   . Unspecified hypothyroidism   . Type II or unspecified type diabetes mellitus with neurological manifestations, not stated as uncontrolled   . Other B-complex deficiencies   . Other organic hypersomnia   . Type I (juvenile type) diabetes mellitus without mention of complication, not stated as uncontrolled   . Depression   . Attention deficit disorder with hyperactivity(314.01)   . Hypertrophy of prostate without urinary obstruction and other lower urinary tract symptoms (LUTS)   . Lumbago   .  ADHD, adult residual type 05/31/2013   Past Surgical History  Procedure Laterality Date  . Penile prosthesis implant    . Tonsillectomy    . Colonoscopy  06/25/2005    Dr. Jeani Hawking   Social History:   reports that he quit smoking about 9 years ago. His smoking use included Cigarettes. He smoked 0.00 packs per day. He has never used smokeless tobacco. He reports that he does not drink  alcohol or use illicit drugs.  Family History  Problem Relation Age of Onset  . Bone cancer Mother   . Breast cancer Mother   . Cancer Mother   . Lung cancer Father   . Cancer Father   . Breast cancer Sister   . Cancer Sister   . Lung cancer Brother   . Cancer Sister     type unknown    Medications: Patient's Medications  New Prescriptions   No medications on file  Previous Medications   ASPIRIN 81 MG TABLET    Take 81 mg by mouth daily.    DIPHENOXYLATE-ATROPINE (LOMOTIL) 2.5-0.025 MG PER TABLET    Take 1 tablet by mouth 3 (three) times daily as needed for diarrhea or loose stools.   ESCITALOPRAM (LEXAPRO) 20 MG TABLET    Take 1 tablet (20 mg total) by mouth daily.   GABAPENTIN (NEURONTIN) 600 MG TABLET    Take 1/2  tablet by mouth three times daily   GLUCOSE BLOOD (ONE TOUCH ULTRA TEST) TEST STRIP    Use to check blood sugars 3 times per day dx code 250.00   HYDROCHLOROTHIAZIDE (HYDRODIURIL) 25 MG TABLET    TAKE ONE TABLET BY MOUTH ONE TIME DAILY for blood pressure   HYDROCODONE-ACETAMINOPHEN (NORCO/VICODIN) 5-325 MG PER TABLET    Take one tablet by mouth five times daily as needed to control pain   INSULIN GLARGINE (LANTUS) 100 UNIT/ML SOLOSTAR PEN    Take 7 units in the morning and 3 units at bedtime   INSULIN ISOPHANE HUMAN (HUMULIN N) 100 UNIT/ML KIWKPEN    Inject 4 Units into the skin daily before supper. 4-6 units   INSULIN LISPRO (HUMALOG) 100 UNIT/ML KIWKPEN    Inject 8-10 Units into the skin daily as needed.   INSULIN PEN NEEDLE (BD PEN NEEDLE NANO U/F) 32G X 4 MM MISC    1 each by Does not apply route 3 (three) times daily.   LEVOTHYROXINE (SYNTHROID, LEVOTHROID) 75 MCG TABLET    Take one tablet by mouth once daily for thyroid   METHYLPHENIDATE (RITALIN) 20 MG TABLET    Take 20 mg by mouth 2 (two) times daily with breakfast and lunch.    METOPROLOL TARTRATE (LOPRESSOR) 25 MG TABLET    TAKE ONE TABLET BY MOUTH TWICE DAILY    TESTOSTERONE CYPIONATE (DEPOTESTOTERONE  CYPIONATE) 200 MG/ML INJECTION    Every 2 weeks, administered by Urologist  Modified Medications   No medications on file  Discontinued Medications   DOXYCYCLINE (VIBRA-TABS) 100 MG TABLET    Take 1 tablet (100 mg total) by mouth 2 (two) times daily.   FERROUS SULFATE 325 (65 FE) MG TABLET    TAKE ONE TABLET BY MOUTH THREE TIMES DAILY      Physical Exam:  Filed Vitals:   01/04/14 1540  BP: 127/70  Pulse: 64  Temp: 97.5 F (36.4 C)  TempSrc: Oral  Weight: 127 lb 3.2 oz (57.698 kg)  SpO2: 96%    Physical Exam  Constitutional: He is oriented to person, place,  and time and well-developed, well-nourished, and in no distress.  Cardiovascular: Normal rate, regular rhythm and normal heart sounds.   Pulmonary/Chest: Effort normal and breath sounds normal.  Neurological: He is alert and oriented to person, place, and time.  Skin: Skin is warm and dry. No rash noted. No erythema.  Small red scar to left foot, no swelling, drainage or heat noted  Psychiatric: Affect normal.     Labs reviewed: Basic Metabolic Panel:  Recent Labs  16/10/96 1702 07/20/13 1646 11/27/13 1555 11/29/13 1507  NA 138 146* 143 143  K 5.5* 5.3* 5.0 5.0  CL 105 105 108  --   CO2 28 26 28 26   GLUCOSE 396* 53* 180* 156*  BUN 21 16 28* 20.9  CREATININE 1.4 1.03 1.6* 1.1  CALCIUM 8.3* 9.4 8.8 9.3  TSH 1.03 1.700 1.06  --    Liver Function Tests:  Recent Labs  04/20/13 1707 05/22/13 1702 11/29/13 1507  AST 41* 25 22  ALT 40 21 20  ALKPHOS 78 56 65  BILITOT 0.4 0.6 0.46  PROT 6.7 6.2 6.6  ALBUMIN  --  3.7 3.9   No results found for this basename: LIPASE, AMYLASE,  in the last 8760 hours No results found for this basename: AMMONIA,  in the last 8760 hours CBC:  Recent Labs  04/24/13 1511 07/20/13 1646 11/29/13 1507  WBC 7.4 6.6 5.3  NEUTROABS 5.2 4.3 3.6  HGB 10.7* 12.1* 11.2*  HCT 31.5* 37.3* 34.4*  MCV 98.1* 101* 102.1*  PLT 204  --  215   Lipid Panel:  Recent Labs   11/27/13 1555  CHOL 168  HDL 55.50  LDLCALC 89  TRIG 118.0  CHOLHDL 3   TSH:  Recent Labs  05/22/13 1702 07/20/13 1646 11/27/13 1555  TSH 1.03 1.700 1.06   A1C: Lab Results  Component Value Date   HGBA1C 7.3* 11/27/2013     Assessment/Plan 1. Cellulitis -resolved -cont to monitor for swelling, redness or drainage -may cont to apply Vaseline or lotion so area does not dry out

## 2014-01-08 ENCOUNTER — Other Ambulatory Visit: Payer: Self-pay | Admitting: *Deleted

## 2014-01-08 ENCOUNTER — Other Ambulatory Visit: Payer: Self-pay | Admitting: Endocrinology

## 2014-01-08 DIAGNOSIS — E1065 Type 1 diabetes mellitus with hyperglycemia: Secondary | ICD-10-CM

## 2014-01-08 DIAGNOSIS — M545 Low back pain, unspecified: Secondary | ICD-10-CM

## 2014-01-08 DIAGNOSIS — IMO0002 Reserved for concepts with insufficient information to code with codable children: Secondary | ICD-10-CM

## 2014-01-08 MED ORDER — HYDROCODONE-ACETAMINOPHEN 5-325 MG PO TABS: ORAL_TABLET | ORAL | Status: DC

## 2014-01-09 ENCOUNTER — Encounter: Payer: Medicare Other | Attending: Endocrinology | Admitting: Nutrition

## 2014-01-09 DIAGNOSIS — E1065 Type 1 diabetes mellitus with hyperglycemia: Secondary | ICD-10-CM

## 2014-01-09 DIAGNOSIS — Z713 Dietary counseling and surveillance: Secondary | ICD-10-CM | POA: Insufficient documentation

## 2014-01-09 DIAGNOSIS — E119 Type 2 diabetes mellitus without complications: Secondary | ICD-10-CM | POA: Insufficient documentation

## 2014-01-09 DIAGNOSIS — IMO0002 Reserved for concepts with insufficient information to code with codable children: Secondary | ICD-10-CM

## 2014-01-10 NOTE — Progress Notes (Signed)
Pt. Came in today to be retrained on how to use the Dexcom. Written instructions for sensor insertion were written by me as he was instructed on how to insert and calibrate the sensor.  He did this on his own, and reported good understanding.  He was shown how/when to calibrate the sensor, and written instructions were also given for this.  We reviewed his diet.  He is counting carbs and using the I/C ratio that Dr. Lucianne Muss gave him, along with a correction scale.  He reported no difficulty with this.  We did review what carbs were, and he was given a list of all of the carb servings for 15 grams.  He said that he was able to read labels to get the carbs, but we reviewed this as well.   His meals appear well balanced most of the time We reviewed what a balanced meal was and the amount of protein needed at each meal.   He also had a questions about taking extra insulin when eating meals that were higher in fat.  He says he rarely does this, but said Dr. Lucianne Muss said something to him about needing more insulin for these meals.  Written instructions were given for taking 1-2 extra units of insulin when 2 or more things in his meal are higher in fat.  He was also told to take the insulin after eating the meal.  He reported good understanding of this. HE will bring the dexcom in in 7 days, and was told to make an appt. With Dr. Lucianne Muss on his way out.  He agreed to do this.

## 2014-01-10 NOTE — Patient Instructions (Signed)
Calibrate sensor twice when the alarm goes off in 2 hours. Calibrate every 12 hours at least each day--the more calibrations the better Have protein at each meal.  Have 2-3 ounces at lunch, and 3-4 ounces at supper meal. When eating high fat meals take 1-2 extra units of fast-acting meal time insulin and take the meal time dose after the meal. Schedule appt. With Dr. Lucianne Muss for 1 week with sensor results.

## 2014-01-15 ENCOUNTER — Ambulatory Visit (INDEPENDENT_AMBULATORY_CARE_PROVIDER_SITE_OTHER): Payer: Medicare Other | Admitting: Endocrinology

## 2014-01-15 ENCOUNTER — Encounter: Payer: Self-pay | Admitting: Endocrinology

## 2014-01-15 VITALS — BP 114/44 | HR 67 | Temp 98.2°F | Resp 14 | Ht 65.0 in | Wt 124.2 lb

## 2014-01-15 DIAGNOSIS — IMO0002 Reserved for concepts with insufficient information to code with codable children: Secondary | ICD-10-CM

## 2014-01-15 DIAGNOSIS — I10 Essential (primary) hypertension: Secondary | ICD-10-CM

## 2014-01-15 DIAGNOSIS — E1065 Type 1 diabetes mellitus with hyperglycemia: Secondary | ICD-10-CM

## 2014-01-15 NOTE — Patient Instructions (Signed)
Take Humalog 5-10 min before meals  Cover snacks with appropriate amount of Humalog

## 2014-01-15 NOTE — Progress Notes (Signed)
Patient ID: Benjamin Hodge, male   DOB: Sep 05, 1941, 73 y.o.   MRN: [id number]   Reason for Appointment : Follow up for Type 1 Diabetes  History of Present Illness          Diagnosis: Type 1 diabetes mellitus, date of diagnosis: 1986         Past history: Patient has had very labile diabetes and generally difficult to control for several years. He had previously been tried on an Accu-Chek pump also which did not appear to be unofficial and he was not very keen on continuing Has been tried on basal bolus insulin regimen for several years. His insulin has been changed continuously without achieving good control. Has been tried on Levemir instead of Lantus without any significant benefit. Also because of tendency to markedly increased blood sugars overnight he has been started on NPH insulin at bedtime along with his twice a day Lantus  INSULIN regimen is described as: LANTUS 3 units at 2 am and 7 units at breakfast. Humulin N 3,  Humalog 1:20 carbohydrate coverage, usually 3 in am and 5 at supper;  correction 1: 50-100  Recent history:  His blood sugars appear to be overall much better in the last few days compared to previous patterns. He has also started using the continuous glucose sensor in the last week Current blood sugar patterns:  Fasting blood sugars are recently better with a range of 110-220 but the only high reading was after a rebound from a low reading and not taking evening Humalog  Blood sugars are only modestly higher now after meals and not consistently; the highest reading was over 375 on 5/6  Overall has variability in blood sugars at all different times especially overnight  Has had only mild hypoglycemia one evening when he took 3 units of Humalog to cover snacks, may not have had enough carbohydrate for this  Factors causing hyperglycemia:  eating relatively high fat meals and not taking enough mealtime coverage. He still does not want to take more than 1:20  carbohydrate coverage but may take sometimes extra one unit  He may take his insulin after eating despite instructions on taking it before eating; his meals are fairly consistent and content and he usually eats whatever he has on his plate   He  is sometimes having  evening snacks and occasional alcoholic drinks. However he thinks because of limiting his alcohol he thinks his blood sugars may be significantly better now  Glucose monitoring:  done 3 times a day         Glucometer: One Touch.      Blood Glucose readings from meter download:   PREMEAL Breakfast  6 PM  Dinner Bedtime Overall  Glucose range:  105-441   103-447   116-368     Median:      198    CGM RECORD INTERPRETATION    Dates of Recording: 01/09/14 through 01/15/14          Sensor  summary:   Average glucose 190 with 81% high and 17% within target and only 2% low He has 2.7 calibration readings per day.      Glycemic patterns:   blood sugars are the highest overall at 2 AM the lowest overall at 11 PM. However there is significant variability from day to day in the overnight patterns. However there is some persistent hyperglycemia in the afternoons and variably in the evenings      Overnight periods:   blood  sugars are mostly high around midnight with either descending or flat patterns the rest of the night without any hypoglycemia      Preprandial periods:   blood sugars are mildly increased and an average before his first meal at about 1 PM and about the same at evening meal around midnight      Postprandial periods:   blood sugars are rising after 2 of his breakfast meals and also about 2 times on the evening meal      Hypoglycemia:  blood sugars low on 5/10 around 11 PM-12 AM with blood sugar range 61-62       Self-care: The diet that the patient has been following is: Usually eating frozen meal at dinnertime  Meals: 2 meals per day.  breakfast is high fat with fruit, usually 60 g carb am and 100g dinner  Physical  activity: exercise: No programmed activity.           Wt Readings from Last 3 Encounters:  01/15/14 124 lb 3.2 oz (56.337 kg)  01/04/14 127 lb 3.2 oz (57.698 kg)  12/12/13 126 lb 11.2 oz (57.471 kg)   Lab Results  Component Value Date   HGBA1C 7.3* 11/27/2013   HGBA1C 7.8* 07/20/2013   HGBA1C 9.4* 04/20/2013   Lab Results  Component Value Date   MICROALBUR 2.0* 11/27/2013   LDLCALC 89 11/27/2013   CREATININE 1.1 11/29/2013       Medication List       This list is accurate as of: 01/15/14  3:29 PM.  Always use your most recent med list.               aspirin 81 MG tablet  Take 81 mg by mouth daily.     diphenoxylate-atropine 2.5-0.025 MG per tablet  Commonly known as:  LOMOTIL  Take 1 tablet by mouth 3 (three) times daily as needed for diarrhea or loose stools.     escitalopram 20 MG tablet  Commonly known as:  LEXAPRO  Take 1 tablet (20 mg total) by mouth daily.     gabapentin 600 MG tablet  Commonly known as:  NEURONTIN  Take 1/2  tablet by mouth three times daily     glucose blood test strip  Commonly known as:  ONE TOUCH ULTRA TEST  Use to check blood sugars 3 times per day dx code 250.00     hydrochlorothiazide 25 MG tablet  Commonly known as:  HYDRODIURIL  TAKE ONE TABLET BY MOUTH ONE TIME DAILY for blood pressure     HYDROcodone-acetaminophen 5-325 MG per tablet  Commonly known as:  NORCO/VICODIN  Take one tablet by mouth five times daily as needed to control pain     Insulin Glargine 100 UNIT/ML Solostar Pen  Commonly known as:  LANTUS  Take 7 units in the morning and 3 units at bedtime     insulin lispro 100 UNIT/ML KiwkPen  Commonly known as:  HUMALOG  Inject 8-10 Units into the skin daily as needed.     Insulin NPH (Human) (Isophane) 100 UNIT/ML Kiwkpen  Commonly known as:  HUMULIN N  Inject 4 Units into the skin daily before supper. 4-6 units     Insulin Pen Needle 32G X 4 MM Misc  Commonly known as:  BD PEN NEEDLE NANO U/F  1 each by Does  not apply route 3 (three) times daily.     levothyroxine 75 MCG tablet  Commonly known as:  SYNTHROID, LEVOTHROID  Take one tablet by  mouth once daily for thyroid     methylphenidate 20 MG tablet  Commonly known as:  RITALIN  Take 20 mg by mouth 2 (two) times daily with breakfast and lunch.     metoprolol tartrate 25 MG tablet  Commonly known as:  LOPRESSOR  TAKE ONE TABLET BY MOUTH TWICE DAILY     testosterone cypionate 200 MG/ML injection  Commonly known as:  DEPOTESTOTERONE CYPIONATE  Every 2 weeks, administered by Urologist     vitamin B-12 1000 MCG tablet  Commonly known as:  CYANOCOBALAMIN  Take 1,000 mcg by mouth daily.        Allergies:  Allergies  Allergen Reactions  . Ace Inhibitors   . Angiotensin Receptor Blockers   . Bee Venom Swelling  . Cymbalta [Duloxetine Hcl]     Past Medical History  Diagnosis Date  . Shoulder pain, left   . Malnutrition 11/28/2008  . Right upper lobe pneumonia 11/28/2008  . Anxiety 07/18/2008  . Irritable bowel syndrome 04/27/2008  . Nocturia 03/13/2008  . COPD (chronic obstructive pulmonary disease) 02/27/2008  . B12 deficiency 02/16/2008    in 5/09: B12 262, MMA 1040  . Chronic diarrhea 01/01/2008    s/p EGD 10/06: H pylori + gastritis, duodenal biopsy normal (Dr. Elnoria Howard); s/p colonoscopy 10/06: hemorrhoids (Dr. Elnoria Howard); evaluated by Dr. Yancey Flemings  in 8/09: tissue transglutaminase AB negative, VIP normal, stool fat content normal, urine 5-HIAA normal; normal GES 11/09 (done for "fluctuating sugars")  . 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 Leg pain, right 09/29/2007  . Fatigue 02/28/2007  . 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 Hyperkalemia 02/09/2007  . Orthostatic hypotension 02/09/2007  . Hypertension 02/09/2007  . Spinal stenosis in cervical region 05/03    MRI  . Erectile dysfunction 09/27/2006    s/p penile implant  . Anemia, iron deficiency 09/27/2006    neg colonoscopy 2006 -  Dr. Elnoria Howard; ferritin 152; hgb  15.7  on 07/07  . Hypothyroidism 09/27/2006    TSH 2.639  07/07  . Hypersomnia 09/27/2006    evaluated by Dr. Jetty Duhamel (5/08) and Dr. Vickey Huger; PSG 8/09: chronic delayed sleep phase syndrome (patient sleeps during the day and is awake at night), nocturnal myoclonus (eval for RLS and IDA suggested). Pt advised to change sleeping behavior  . Diabetes mellitus type II 09/07/1984    poorly controlled, complicated by peripheral neuropathy, microalbuminuria, mild non proliferative retinopathy, s/p DKA 7/05, on insulin pump x 4/09, started a pump vacation on 11/19/2008  . Hyperlipidemia   . 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 Hemorrhoids   . Chronic kidney disease   . Unspecified hereditary and idiopathic peripheral neuropathy   . Hyperpotassemia   . Other testicular hypofunction   . Impotence of organic origin   . Other chronic pain   . Other malaise and fatigue   . Unspecified hypothyroidism   . Type II or unspecified type diabetes mellitus with neurological manifestations, not stated as uncontrolled   . Other B-complex deficiencies   . Other organic hypersomnia   . Type I (juvenile type) diabetes mellitus without mention of complication, not stated as uncontrolled   . Depression   . Attention deficit disorder with hyperactivity(314.01)   . Hypertrophy of prostate without urinary obstruction and other lower urinary tract symptoms (LUTS)   . Lumbago   . ADHD, adult residual type 05/31/2013    Past Surgical History  Procedure Laterality Date  . Penile prosthesis implant    . Tonsillectomy    .  Colonoscopy  06/25/2005    Dr. Jeani Hawking    Family History  Problem Relation Age of Onset  . Bone cancer Mother   . Breast cancer Mother   . Cancer Mother   . Lung cancer Father   . Cancer Father   . Breast cancer Sister   . Cancer Sister   . Lung cancer Brother   . Cancer Sister     type unknown    Social History:  reports that he quit smoking about 9  years ago. His smoking use included Cigarettes. He smoked 0.00 packs per day. He has never used smokeless tobacco. He reports that he does not drink alcohol or use illicit drugs.    Review of Systems:   ANEMIA: His hemoglobin  Has been low and this is followed by hematologist   He has been diagnosed to have hypogonadism and is  being treated by his urologist with injections every 2 weeks He has had mild hypertension followed by PCP History of chronic diarrhea of unclear etiology Has had BPH with significant nocturia  HYPERKALEMIA: Potassium was high previously and is normal now   History of hypothyroidism, currently taking 75 mcg   Lab Results  Component Value Date   TSH 1.06 11/27/2013   Has good control of his lipids  Lab Results  Component Value Date   CHOL 168 11/27/2013   HDL 55.50 11/27/2013   LDLCALC 89 11/27/2013   TRIG 118.0 11/27/2013   CHOLHDL 3 11/27/2013    Physical Examination:  BP 114/44  Pulse 67  Temp(Src) 98.2 F (36.8 C)  Resp 14  Ht 5\' 5"  (1.651 m)  Wt 124 lb 3.2 oz (56.337 kg)  BMI 20.67 kg/m2  SpO2 93%         ASSESSMENT:  Diabetes type 1:   His blood sugars are much better controlled in the last week or so Not clear this is from his reducing a lot of alcoholic drinks and evenings or because of watching his diet as discussed with nurse educator; his insulin dose remains the same See history of present illness for current discussion of his blood sugar patterns especially as reviewed on his continuous glucose monitoring Except for a couple of occasions his blood sugars appear to be reasonably close to his target of about 150 including overnight Most of his consistent patterns are those of rising blood sugar after breakfast on some mornings and occasionally after evening meal Part of his mealtime hyperglycemia may be coming from his taking his Humalog postprandially  PLAN:    Take Humalog before meals especially breakfast  If blood sugars are  high after breakfast may need to add one more unit  No change in Lantus at present  Consider increasing NPH at bedtime if blood sugars started rising late at  Night  Continue adding about one unit more for higher fat meals  Counseling time over 50% of today's 25 minute visit  Reather Littler 01/15/2014, 3:29 PM

## 2014-01-19 ENCOUNTER — Encounter (HOSPITAL_COMMUNITY): Payer: Self-pay | Admitting: Emergency Medicine

## 2014-01-19 ENCOUNTER — Other Ambulatory Visit: Payer: Self-pay

## 2014-01-19 ENCOUNTER — Emergency Department (HOSPITAL_COMMUNITY)
Admission: EM | Admit: 2014-01-19 | Discharge: 2014-01-19 | Disposition: A | Discharge status: Home / Self Care | Payer: Medicare Other | Attending: Emergency Medicine | Admitting: Emergency Medicine

## 2014-01-19 DIAGNOSIS — Z8739 Personal history of other diseases of the musculoskeletal system and connective tissue: Secondary | ICD-10-CM | POA: Insufficient documentation

## 2014-01-19 DIAGNOSIS — E291 Testicular hypofunction: Secondary | ICD-10-CM | POA: Insufficient documentation

## 2014-01-19 DIAGNOSIS — F909 Attention-deficit hyperactivity disorder, unspecified type: Secondary | ICD-10-CM | POA: Insufficient documentation

## 2014-01-19 DIAGNOSIS — E039 Hypothyroidism, unspecified: Secondary | ICD-10-CM | POA: Insufficient documentation

## 2014-01-19 DIAGNOSIS — E162 Hypoglycemia, unspecified: Secondary | ICD-10-CM

## 2014-01-19 DIAGNOSIS — Z87891 Personal history of nicotine dependence: Secondary | ICD-10-CM | POA: Insufficient documentation

## 2014-01-19 DIAGNOSIS — E1139 Type 2 diabetes mellitus with other diabetic ophthalmic complication: Secondary | ICD-10-CM | POA: Insufficient documentation

## 2014-01-19 DIAGNOSIS — Z79899 Other long term (current) drug therapy: Secondary | ICD-10-CM | POA: Insufficient documentation

## 2014-01-19 DIAGNOSIS — Z862 Personal history of diseases of the blood and blood-forming organs and certain disorders involving the immune mechanism: Secondary | ICD-10-CM | POA: Insufficient documentation

## 2014-01-19 DIAGNOSIS — E119 Type 2 diabetes mellitus without complications: Secondary | ICD-10-CM

## 2014-01-19 DIAGNOSIS — E538 Deficiency of other specified B group vitamins: Secondary | ICD-10-CM | POA: Insufficient documentation

## 2014-01-19 DIAGNOSIS — Z8701 Personal history of pneumonia (recurrent): Secondary | ICD-10-CM | POA: Insufficient documentation

## 2014-01-19 DIAGNOSIS — J449 Chronic obstructive pulmonary disease, unspecified: Secondary | ICD-10-CM | POA: Insufficient documentation

## 2014-01-19 DIAGNOSIS — D509 Iron deficiency anemia, unspecified: Secondary | ICD-10-CM | POA: Insufficient documentation

## 2014-01-19 DIAGNOSIS — E1142 Type 2 diabetes mellitus with diabetic polyneuropathy: Secondary | ICD-10-CM | POA: Insufficient documentation

## 2014-01-19 DIAGNOSIS — F411 Generalized anxiety disorder: Secondary | ICD-10-CM | POA: Insufficient documentation

## 2014-01-19 DIAGNOSIS — N189 Chronic kidney disease, unspecified: Secondary | ICD-10-CM | POA: Insufficient documentation

## 2014-01-19 DIAGNOSIS — R000 Tachycardia, unspecified: Secondary | ICD-10-CM | POA: Insufficient documentation

## 2014-01-19 DIAGNOSIS — F3289 Other specified depressive episodes: Secondary | ICD-10-CM | POA: Insufficient documentation

## 2014-01-19 DIAGNOSIS — Z794 Long term (current) use of insulin: Secondary | ICD-10-CM | POA: Insufficient documentation

## 2014-01-19 DIAGNOSIS — E1149 Type 2 diabetes mellitus with other diabetic neurological complication: Secondary | ICD-10-CM | POA: Insufficient documentation

## 2014-01-19 DIAGNOSIS — J4489 Other specified chronic obstructive pulmonary disease: Secondary | ICD-10-CM | POA: Insufficient documentation

## 2014-01-19 DIAGNOSIS — E1169 Type 2 diabetes mellitus with other specified complication: Secondary | ICD-10-CM | POA: Insufficient documentation

## 2014-01-19 DIAGNOSIS — I129 Hypertensive chronic kidney disease with stage 1 through stage 4 chronic kidney disease, or unspecified chronic kidney disease: Secondary | ICD-10-CM | POA: Insufficient documentation

## 2014-01-19 DIAGNOSIS — Z7982 Long term (current) use of aspirin: Secondary | ICD-10-CM | POA: Insufficient documentation

## 2014-01-19 DIAGNOSIS — K589 Irritable bowel syndrome without diarrhea: Secondary | ICD-10-CM | POA: Insufficient documentation

## 2014-01-19 DIAGNOSIS — F329 Major depressive disorder, single episode, unspecified: Secondary | ICD-10-CM | POA: Insufficient documentation

## 2014-01-19 DIAGNOSIS — Z8639 Personal history of other endocrine, nutritional and metabolic disease: Secondary | ICD-10-CM | POA: Insufficient documentation

## 2014-01-19 DIAGNOSIS — E11329 Type 2 diabetes mellitus with mild nonproliferative diabetic retinopathy without macular edema: Secondary | ICD-10-CM | POA: Insufficient documentation

## 2014-01-19 DIAGNOSIS — G8929 Other chronic pain: Secondary | ICD-10-CM | POA: Insufficient documentation

## 2014-01-19 LAB — CBC WITH DIFFERENTIAL/PLATELET
BASOS ABS: 0 10*3/uL (ref 0.0–0.1)
BASOS PCT: 0 % (ref 0–1)
EOS ABS: 0.1 10*3/uL (ref 0.0–0.7)
Eosinophils Relative: 1 % (ref 0–5)
HEMATOCRIT: 39.4 % (ref 39.0–52.0)
HEMOGLOBIN: 12.6 g/dL — AB (ref 13.0–17.0)
Lymphocytes Relative: 26 % (ref 12–46)
Lymphs Abs: 2.1 10*3/uL (ref 0.7–4.0)
MCH: 33.9 pg (ref 26.0–34.0)
MCHC: 32 g/dL (ref 30.0–36.0)
MCV: 105.9 fL — ABNORMAL HIGH (ref 78.0–100.0)
MONO ABS: 0.8 10*3/uL (ref 0.1–1.0)
MONOS PCT: 10 % (ref 3–12)
Neutro Abs: 5.1 10*3/uL (ref 1.7–7.7)
Neutrophils Relative %: 63 % (ref 43–77)
Platelets: 272 10*3/uL (ref 150–400)
RBC: 3.72 MIL/uL — ABNORMAL LOW (ref 4.22–5.81)
RDW: 13.3 % (ref 11.5–15.5)
WBC: 8.1 10*3/uL (ref 4.0–10.5)

## 2014-01-19 LAB — TROPONIN I: Troponin I: <0.3 ng/mL (ref <0.30)

## 2014-01-19 LAB — COMPREHENSIVE METABOLIC PANEL
ALBUMIN: 4.6 g/dL (ref 3.5–5.2)
ALT: 20 U/L (ref 0–53)
AST: 29 U/L (ref 0–37)
Alkaline Phosphatase: 64 U/L (ref 39–117)
BUN: 22 mg/dL (ref 6–23)
CALCIUM: 9.3 mg/dL (ref 8.4–10.5)
CO2: 25 mEq/L (ref 19–32)
Chloride: 105 mEq/L (ref 96–112)
Creatinine, Ser: 1.41 mg/dL — ABNORMAL HIGH (ref 0.50–1.35)
GFR calc Af Amer: 57 mL/min — ABNORMAL LOW (ref >90)
GFR calc non Af Amer: 49 mL/min — ABNORMAL LOW (ref >90)
GLUCOSE: 24 mg/dL — AB (ref 70–99)
Potassium: 4.6 mEq/L (ref 3.7–5.3)
Sodium: 143 mEq/L (ref 137–147)
TOTAL PROTEIN: 7.5 g/dL (ref 6.0–8.3)
Total Bilirubin: 0.5 mg/dL (ref 0.3–1.2)

## 2014-01-19 LAB — CBG MONITORING, ED
GLUCOSE-CAPILLARY: 105 mg/dL — AB (ref 70–99)
Glucose-Capillary: 136 mg/dL — ABNORMAL HIGH (ref 70–99)
Glucose-Capillary: 189 mg/dL — ABNORMAL HIGH (ref 70–99)

## 2014-01-19 LAB — I-STAT CG4 LACTIC ACID, ED: Lactic Acid, Venous: 1.31 mmol/L (ref 0.5–2.2)

## 2014-01-19 MED ORDER — DEXTROSE 50 % IV SOLN
1.0000 | Freq: Once | INTRAVENOUS | Status: AC
  Administered 2014-01-19: 50 mL via INTRAVENOUS

## 2014-01-19 NOTE — ED Provider Notes (Signed)
CSN: [id number]     Arrival date & time 01/19/14  0107 History   First MD Initiated Contact with Patient 01/19/14 0112     Chief Complaint  Patient presents with  . Hypoglycemia     (Consider location/radiation/quality/duration/timing/severity/associated sxs/prior Treatment) HPI 73 yo male presents to the ER from home unresponsive.  Family reports pt was acting oddly, then became less responsive in route.  Pt has h/o IDDM, they were unable to check his sugar prior to arrival.  Pt lives alone, has history of hypersomnolence, DM, IBS, BPH, ADHD.  Pt unresponsive upon arrival, initial CBG 24.  Pt improved after amp of D50.  He reports seeing his endocrinologist this week, no changes in medications.  Pt takes 7 units of Lantus upon waking usually around 1 pm, and then 1-2 units around 1 am.  For meals he takes humalog sliding scale with meals.  He reports no change in his eating habits.  He is unsure if he ate dinner tonight.  He has not taken evening lantus or humalog.  Pt has constant cbg monitor on his left lower abd.  He has had several epsiodes of hypoglycemia over the last 3 days, usually while sleeping, getting into the 30-40 range.   Past Medical History  Diagnosis Date  . Shoulder pain, left   . Malnutrition 11/28/2008  . Right upper lobe pneumonia 11/28/2008  . Anxiety 07/18/2008  . Irritable bowel syndrome 04/27/2008  . Nocturia 03/13/2008  . COPD (chronic obstructive pulmonary disease) 02/27/2008  . B12 deficiency 02/16/2008    in 5/09: B12 262, MMA 1040  . Chronic diarrhea 01/01/2008    s/p EGD 10/06: H pylori + gastritis, duodenal biopsy normal (Dr. Elnoria HowardHung); s/p colonoscopy 10/06: hemorrhoids (Dr. Elnoria HowardHung); evaluated by Dr. Yancey FlemingsJohn Perry  in 8/09: tissue transglutaminase AB negative, VIP normal, stool fat content normal, urine 5-HIAA normal; normal GES 11/09 (done for "fluctuating sugars")  . 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 Leg pain, right 09/29/2007  .  Fatigue 02/28/2007  . 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 Hyperkalemia 02/09/2007  . Orthostatic hypotension 02/09/2007  . Hypertension 02/09/2007  . Spinal stenosis in cervical region 05/03    MRI  . Erectile dysfunction 09/27/2006    s/p penile implant  . Anemia, iron deficiency 09/27/2006    neg colonoscopy 2006 - Dr. Elnoria HowardHung; ferritin 152; hgb  15.7  on 07/07  . Hypothyroidism 09/27/2006    TSH 2.639  07/07  . Hypersomnia 09/27/2006    evaluated by Dr. Jetty Duhamellinton Young (5/08) and Dr. Vickey Hugerohmeier; PSG 8/09: chronic delayed sleep phase syndrome (patient sleeps during the day and is awake at night), nocturnal myoclonus (eval for RLS and IDA suggested). Pt advised to change sleeping behavior  . Diabetes mellitus type II 09/07/1984    poorly controlled, complicated by peripheral neuropathy, microalbuminuria, mild non proliferative retinopathy, s/p DKA 7/05, on insulin pump x 4/09, started a pump vacation on 11/19/2008  . Hyperlipidemia   . 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 Hemorrhoids   . Chronic kidney disease   . Unspecified hereditary and idiopathic peripheral neuropathy   . Hyperpotassemia   . Other testicular hypofunction   . Impotence of organic origin   . Other chronic pain   . Other malaise and fatigue   . Unspecified hypothyroidism   . Type II or unspecified type diabetes mellitus with neurological manifestations, not stated as uncontrolled   . Other B-complex deficiencies   . Other organic hypersomnia   . Type I (juvenile  type) diabetes mellitus without mention of complication, not stated as uncontrolled   . Depression   . Attention deficit disorder with hyperactivity(314.01)   . Hypertrophy of prostate without urinary obstruction and other lower urinary tract symptoms (LUTS)   . Lumbago   . ADHD, adult residual type 05/31/2013   Past Surgical History  Procedure Laterality Date  . Penile prosthesis implant    . Tonsillectomy    . Colonoscopy  06/25/2005    Dr. Jeani Hawking   Family History  Problem Relation Age of Onset  . Bone cancer Mother   . Breast cancer Mother   . Cancer Mother   . Lung cancer Father   . Cancer Father   . Breast cancer Sister   . Cancer Sister   . Lung cancer Brother   . Cancer Sister     type unknown   History  Substance Use Topics  . Smoking status: Former Smoker    Types: Cigarettes    Quit date: 09/28/2004  . Smokeless tobacco: Never Used  . Alcohol Use: No     Comment: quit in 2005    Review of Systems  All other systems reviewed and are negative.     Allergies  Ace inhibitors; Angiotensin receptor blockers; Bee venom; and Cymbalta  Home Medications   Prior to Admission medications   Medication Sig Start Date End Date Taking? Authorizing Provider  aspirin 81 MG tablet Take 81 mg by mouth daily.    Yes Historical Provider, MD  diphenoxylate-atropine (LOMOTIL) 2.5-0.025 MG per tablet Take 1 tablet by mouth 3 (three) times daily as needed for diarrhea or loose stools. 10/19/13  Yes [name] L [name], DO  escitalopram (LEXAPRO) 20 MG tablet Take 1 tablet (20 mg total) by mouth daily. 10/26/13  Yes [name] L [name], DO  gabapentin (NEURONTIN) 600 MG tablet Take 1/2  tablet by mouth three times daily   Yes Reather Littler, MD  hydrochlorothiazide (HYDRODIURIL) 25 MG tablet Take 25 mg by mouth daily.   Yes Historical Provider, MD  HYDROcodone-acetaminophen (NORCO/VICODIN) 5-325 MG per tablet Take one tablet by mouth five times daily as needed to control pain 01/08/14  Yes [name] L [name], DO  Insulin Glargine (LANTUS) 100 UNIT/ML Solostar Pen Take 7 units in the morning and 3 units at bedtime 09/21/13  Yes Reather Littler, MD  Insulin Isophane Human (HUMULIN N) 100 UNIT/ML Kiwkpen Inject 3 Units into the skin every evening.  10/18/13  Yes Reather Littler, MD  insulin lispro (HUMALOG) 100 UNIT/ML KiwkPen Inject 8-10 Units into the skin daily as needed (after meals).  03/16/13  Yes Reather Littler, MD  levothyroxine (SYNTHROID, LEVOTHROID) 75 MCG  tablet Take one tablet by mouth once daily for thyroid 12/04/13  Yes [name] L [name], DO  methylphenidate (RITALIN) 20 MG tablet Take 20 mg by mouth See admin instructions. Takes everyday at 1PM when he wakes up, and if remembers before 6PM will take again. 10/10/13  Yes Micki Riley, MD  metoprolol tartrate (LOPRESSOR) 25 MG tablet Take 25 mg by mouth 2 (two) times daily.   Yes Historical Provider, MD  testosterone cypionate (DEPOTESTOTERONE CYPIONATE) 200 MG/ML injection Every 2 weeks, administered by Urologist 04/26/13  Yes Historical Provider, MD  vitamin B-12 (CYANOCOBALAMIN) 1000 MCG tablet Take 1,000 mcg by mouth daily.   Yes Historical Provider, MD   BP 145/65  Pulse 70  Resp 22  SpO2 97% Physical Exam  Nursing note and vitals reviewed. Constitutional: He is oriented to person,  place, and time. He appears well-developed and well-nourished.  Initial exam:  Pt diaphoretic, unresponsive, tachycardic.  After d50, pt with return to baseline, normal mentation  HENT:  Head: Normocephalic and atraumatic.  Right Ear: External ear normal.  Left Ear: External ear normal.  Nose: Nose normal.  Mouth/Throat: Oropharynx is clear and moist.  Eyes: Conjunctivae and EOM are normal. Pupils are equal, round, and reactive to light.  Neck: Normal range of motion. Neck supple. No JVD present. No tracheal deviation present. No thyromegaly present.  Cardiovascular: Normal rate, regular rhythm, normal heart sounds and intact distal pulses.  Exam reveals no gallop and no friction rub.   No murmur heard. Pulmonary/Chest: Effort normal and breath sounds normal. No stridor. No respiratory distress. He has no wheezes. He has no rales. He exhibits no tenderness.  Abdominal: Soft. Bowel sounds are normal. He exhibits no distension and no mass. There is no tenderness. There is no rebound and no guarding.  Musculoskeletal: Normal range of motion. He exhibits no edema and no tenderness.  Lymphadenopathy:    He has no  cervical adenopathy.  Neurological: He is alert and oriented to person, place, and time. He has normal reflexes. No cranial nerve deficit. He exhibits normal muscle tone. Coordination normal.  Skin: Skin is warm and dry. No rash noted. No erythema. No pallor.  Psychiatric: He has a normal mood and affect. His behavior is normal. Judgment and thought content normal.    ED Course  Procedures (including critical care time) Labs Review Labs Reviewed  CBC WITH DIFFERENTIAL - Abnormal; Notable for the following:    RBC 3.72 (*)    Hemoglobin 12.6 (*)    MCV 105.9 (*)    All other components within normal limits  COMPREHENSIVE METABOLIC PANEL - Abnormal; Notable for the following:    Glucose, Bld 24 (*)    Creatinine, Ser 1.41 (*)    GFR calc non Af Amer 49 (*)    GFR calc Af Amer 57 (*)    All other components within normal limits  CBG MONITORING, ED - Abnormal; Notable for the following:    Glucose-Capillary 105 (*)    All other components within normal limits  CBG MONITORING, ED - Abnormal; Notable for the following:    Glucose-Capillary 136 (*)    All other components within normal limits  CBG MONITORING, ED - Abnormal; Notable for the following:    Glucose-Capillary 189 (*)    All other components within normal limits  TROPONIN I  I-STAT CG4 LACTIC ACID, ED    Imaging Review No results found.   EKG Interpretation None      Date: 01/19/2014  Rate: 56  Rhythm: normal sinus rhythm and premature ventricular contractions (PVC)  QRS Axis: normal  Intervals: normal  ST/T Wave abnormalities: normal  Conduction Disutrbances:none  Narrative Interpretation:   Old EKG Reviewed: none available    MDM   Final diagnoses:  Hypoglycemia  DM (diabetes mellitus)    73 year old male with unresponsive episode secondary to hypoglycemia.  Patient has had multiple recent hypoglycemic episodes per his continuous monitor.  Patient has been stable here in the department after eating a  sandwich.  He reports no change in his eating habits or dosing of Lantus and Humalog.  Given that patient lives alone, and has had several hypoglycemic episodes recently, I advised to stop his Lantus until he is able to talk to his endocrinologist, and manage his sugars just with sliding scale.  Patient reports he  is well versed in Humalog and treating his blood sugars with fingersticks.  He is comfortable with this plan.  He is instructed to call Dr. Remus Blake office today.    Olivia Mackie, MD 01/19/14 2032

## 2014-01-19 NOTE — ED Notes (Signed)
MD at bedside. 

## 2014-01-19 NOTE — ED Notes (Signed)
Unresponsive; hypoglycemic. CBG 23. After d50 given, pt. Is not alert and oriented. Pts.' CBG irregular to irregular sleep patterns.

## 2014-01-19 NOTE — ED Notes (Signed)
Family at bedside. 

## 2014-01-19 NOTE — Discharge Instructions (Signed)
Hold your Lantus until you follow up with Dr Lucianne MussKumar.  Given your recent low blood sugars, use your humalog sliding scale only.  Return to the ER for worsening condition or new concerning symptoms.   Hypoglycemia (Low Blood Sugar) Hypoglycemia is when the glucose (sugar) in your blood is too low. Hypoglycemia can happen for many reasons. It can happen to people with or without diabetes. Hypoglycemia can develop quickly and can be a medical emergency.  CAUSES  Having hypoglycemia does not mean that you will develop diabetes. Different causes include:  Missed or delayed meals or not enough carbohydrates eaten.  Medication overdose. This could be by accident or deliberate. If by accident, your medication may need to be adjusted or changed.  Exercise or increased activity without adjustments in carbohydrates or medications.  A nerve disorder that affects body functions like your heart rate, blood pressure and digestion (autonomic neuropathy).  A condition where the stomach muscles do not function properly (gastroparesis). Therefore, medications may not absorb properly.  The inability to recognize the signs of hypoglycemia (hypoglycemic unawareness).  Absorption of insulin  may be altered.  Alcohol consumption.  Pregnancy/menstrual cycles/postpartum. This may be due to hormones.  Certain kinds of tumors. This is very rare. SYMPTOMS   Sweating.  Hunger.  Dizziness.  Blurred vision.  Drowsiness.  Weakness.  Headache.  Rapid heart beat.  Shakiness.  Nervousness. DIAGNOSIS  Diagnosis is made by monitoring blood glucose in one or all of the following ways:  Fingerstick blood glucose monitoring.  Laboratory results. TREATMENT  If you think your blood glucose is low:  Check your blood glucose, if possible. If it is less than 70 mg/dl, take one of the following:  3-4 glucose tablets.   cup juice (prefer clear like apple).   cup "regular" soda pop.  1 cup  milk.  -1 tube of glucose gel.  5-6 hard candies.  Do not over treat because your blood glucose (sugar) will only go too high.  Wait 15 minutes and recheck your blood glucose. If it is still less than 70 mg/dl (or below your target range), repeat treatment.  Eat a snack if it is more than one hour until your next meal. Sometimes, your blood glucose may go so low that you are unable to treat yourself. You may need someone to help you. You may even pass out or be unable to swallow. This may require you to get an injection of glucagon, which raises the blood glucose. HOME CARE INSTRUCTIONS  Check blood glucose as recommended by your caregiver.  Take medication as prescribed by your caregiver.  Follow your meal plan. Do not skip meals. Eat on time.  If you are going to drink alcohol, drink it only with meals.  Check your blood glucose before driving.  Check your blood glucose before and after exercise. If you exercise longer or different than usual, be sure to check blood glucose more frequently.  Always carry treatment with you. Glucose tablets are the easiest to carry.  Always wear medical alert jewelry or carry some form of identification that states that you have diabetes. This will alert people that you have diabetes. If you have hypoglycemia, they will have a better idea on what to do. SEEK MEDICAL CARE IF:   You are having problems keeping your blood sugar at target range.  You are having frequent episodes of hypoglycemia.  You feel you might be having side effects from your medicines.  You have symptoms of an illness  that is not improving after 3-4 days.  You notice a change in vision or a new problem with your vision. SEEK IMMEDIATE MEDICAL CARE IF:   You are a family member or friend of a person whose blood glucose goes below 70 mg/dl and is accompanied by:  Confusion.  A change in mental status.  The inability to swallow.  Passing out. Document Released:  08/24/2005 Document Revised: 11/16/2011 Document Reviewed: 12/21/2011 [facility] Patient Information 2014 [place], [place].

## 2014-01-19 NOTE — ED Notes (Signed)
CBG Taken = 105  

## 2014-01-22 ENCOUNTER — Telehealth: Payer: Self-pay | Admitting: Neurology

## 2014-01-22 NOTE — Telephone Encounter (Signed)
Patient requesting refill for methylphenidate (RITALIN) 20 MG tablet °

## 2014-01-23 ENCOUNTER — Ambulatory Visit (INDEPENDENT_AMBULATORY_CARE_PROVIDER_SITE_OTHER): Payer: Medicare Other | Admitting: Endocrinology

## 2014-01-23 ENCOUNTER — Encounter: Payer: Self-pay | Admitting: Endocrinology

## 2014-01-23 ENCOUNTER — Other Ambulatory Visit: Payer: Self-pay

## 2014-01-23 ENCOUNTER — Encounter: Payer: Medicare Other | Admitting: Nutrition

## 2014-01-23 VITALS — BP 122/62 | HR 64 | Temp 98.0°F | Resp 12 | Ht 65.0 in | Wt 123.6 lb

## 2014-01-23 DIAGNOSIS — E1065 Type 1 diabetes mellitus with hyperglycemia: Secondary | ICD-10-CM

## 2014-01-23 DIAGNOSIS — IMO0002 Reserved for concepts with insufficient information to code with codable children: Secondary | ICD-10-CM

## 2014-01-23 DIAGNOSIS — E039 Hypothyroidism, unspecified: Secondary | ICD-10-CM

## 2014-01-23 DIAGNOSIS — R42 Dizziness and giddiness: Secondary | ICD-10-CM

## 2014-01-23 DIAGNOSIS — E1169 Type 2 diabetes mellitus with other specified complication: Secondary | ICD-10-CM

## 2014-01-23 DIAGNOSIS — E11649 Type 2 diabetes mellitus with hypoglycemia without coma: Secondary | ICD-10-CM

## 2014-01-23 LAB — BASIC METABOLIC PANEL
BUN: 40 mg/dL — ABNORMAL HIGH (ref 6–23)
CO2: 26 meq/L (ref 19–32)
Calcium: 9.1 mg/dL (ref 8.4–10.5)
Chloride: 98 mEq/L (ref 96–112)
Creatinine, Ser: 2.1 mg/dL — ABNORMAL HIGH (ref 0.4–1.5)
GFR: 34.19 mL/min — ABNORMAL LOW (ref >60.00)
GLUCOSE: 321 mg/dL — AB (ref 70–99)
Potassium: 4.9 mEq/L (ref 3.5–5.1)
Sodium: 134 mEq/L — ABNORMAL LOW (ref 135–145)

## 2014-01-23 LAB — TSH: TSH: 1.43 u[IU]/mL (ref 0.35–4.50)

## 2014-01-23 MED ORDER — METHYLPHENIDATE HCL 20 MG PO TABS: 20.0000 mg | ORAL_TABLET | Freq: Two times a day (BID) | ORAL | Status: DC

## 2014-01-23 NOTE — Patient Instructions (Signed)
Call when insulin pump arrives at the house.

## 2014-01-23 NOTE — Progress Notes (Signed)
Patient ID: Benjamin Hodge, male   DOB: [birth year], 73 y.o.   MRN: [id number]   Reason for Appointment : Follow up for Type 1 Diabetes  History of Present Illness          Diagnosis: Type 1 diabetes mellitus, date of diagnosis: 1986         Past history: Patient has had very labile diabetes and generally difficult to control for several years. He had previously been tried on an Accu-Chek pump also which did not appear to be unofficial and he was not very keen on continuing Has been tried on basal bolus insulin regimen for several years. His insulin has been changed continuously without achieving good control. Has been tried on Levemir instead of Lantus without any significant benefit. Also because of tendency to markedly increased blood sugars overnight he has been started on NPH insulin at bedtime along with his twice a day Lantus  INSULIN regimen is described as: LANTUS 3 units at 2 am and 7 units at breakfast. Humulin N 3,  Humalog 1:20 carbohydrate coverage, usually 3 in am and 5 at supper;  correction 1: 50-100  Recent history:  He had an episode of severe hypoglycemia on 01/18/14 at about midnight His glucose was 251 at about 10 PM and he is not sure if he took any correction dose of NovoLog at that time but his blood sugar was down to 97 around midnight and he became very confused and drowsy; blood glucose in the ER was 23 His family was able to take him to the ER since he was not cooperating with drinking juice Also he had been having low readings before breakfast for 3 days before this but he did not report this to [place] or change his insulin Blood sugars at night however still relatively high His blood sugars appear to be overall much better in the last few days compared to previous patterns. He did not bring his DexCom today for download At the emergency room he was told not to take any long-acting insulin and his blood sugars have been significantly high with some readings over  500  GlucoseSelf-care: The diet that the patient has been following is: Usually eating frozen meal at dinnertime  Meals: 2 meals per day.  breakfast is high fat with fruit, usually 60 g carb am and 100g dinner  He had been eliminating alcoholic drinks like Margaritas prior to his last visit which was causing some hypoglycemia in the evenings and overnight Physical activity: exercise: No programmed activity.           Wt Readings from Last 3 Encounters:  01/23/14 123 lb 9.6 oz (56.065 kg)  01/15/14 124 lb 3.2 oz (56.337 kg)  01/04/14 127 lb 3.2 oz (57.698 kg)   Lab Results  Component Value Date   HGBA1C 7.3* 11/27/2013   HGBA1C 7.8* 07/20/2013   HGBA1C 9.4* 04/20/2013   Lab Results  Component Value Date   MICROALBUR 2.0* 11/27/2013   LDLCALC 89 11/27/2013   CREATININE 1.41* 01/19/2014       Medication List       This list is accurate as of: 01/23/14  8:29 AM.  Always use your most recent med list.               aspirin 81 MG tablet  Take 81 mg by mouth daily.     diphenoxylate-atropine 2.5-0.025 MG per tablet  Commonly known as:  LOMOTIL  Take 1 tablet by mouth 3 (three)  times daily as needed for diarrhea or loose stools.     escitalopram 20 MG tablet  Commonly known as:  LEXAPRO  Take 1 tablet (20 mg total) by mouth daily.     gabapentin 600 MG tablet  Commonly known as:  NEURONTIN  Take 1/2  tablet by mouth three times daily     hydrochlorothiazide 25 MG tablet  Commonly known as:  HYDRODIURIL  Take 25 mg by mouth daily.     HYDROcodone-acetaminophen 5-325 MG per tablet  Commonly known as:  NORCO/VICODIN  Take one tablet by mouth five times daily as needed to control pain     Insulin Glargine 100 UNIT/ML Solostar Pen  Commonly known as:  LANTUS  Take 7 units in the morning and 3 units at bedtime     insulin lispro 100 UNIT/ML KiwkPen  Commonly known as:  HUMALOG  Inject 8-10 Units into the skin daily as needed (after meals).     Insulin NPH (Human)  (Isophane) 100 UNIT/ML Kiwkpen  Commonly known as:  HUMULIN N  Inject 3 Units into the skin every evening.     levothyroxine 75 MCG tablet  Commonly known as:  SYNTHROID, LEVOTHROID  Take one tablet by mouth once daily for thyroid     methylphenidate 20 MG tablet  Commonly known as:  RITALIN  Take 20 mg by mouth See admin instructions. Takes everyday at 1PM when he wakes up, and if remembers before 6PM will take again.     metoprolol tartrate 25 MG tablet  Commonly known as:  LOPRESSOR  Take 25 mg by mouth 2 (two) times daily.     ONE TOUCH ULTRA TEST test strip  Generic drug:  glucose blood     testosterone cypionate 200 MG/ML injection  Commonly known as:  DEPOTESTOTERONE CYPIONATE  Every 2 weeks, administered by Urologist     vitamin B-12 1000 MCG tablet  Commonly known as:  CYANOCOBALAMIN  Take 1,000 mcg by mouth daily.        Allergies:  Allergies  Allergen Reactions  . Ace Inhibitors Other (See Comments)    dizzy  . Angiotensin Receptor Blockers Other (See Comments)    unknown  . Bee Venom Swelling  . Cymbalta [Duloxetine Hcl] Other (See Comments)    dizzy    Past Medical History  Diagnosis Date  . Shoulder pain, left   . Malnutrition 11/28/2008  . Right upper lobe pneumonia 11/28/2008  . Anxiety 07/18/2008  . Irritable bowel syndrome 04/27/2008  . Nocturia 03/13/2008  . COPD (chronic obstructive pulmonary disease) 02/27/2008  . B12 deficiency 02/16/2008    in 5/09: B12 262, MMA 1040  . Chronic diarrhea 01/01/2008    s/p EGD 10/06: H pylori + gastritis, duodenal biopsy normal (Dr. Elnoria Howard); s/p colonoscopy 10/06: hemorrhoids (Dr. Elnoria Howard); evaluated by Dr. Yancey Flemings  in 8/09: tissue transglutaminase AB negative, VIP normal, stool fat content normal, urine 5-HIAA normal; normal GES 11/09 (done for "fluctuating sugars")  . 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 Leg pain, right 09/29/2007  . Fatigue 02/28/2007  . Polyneuropathy in  diabetes(357.2) 02/09/2007  . Hyperkalemia 02/09/2007  . Orthostatic hypotension 02/09/2007  . Hypertension 02/09/2007  . Spinal stenosis in cervical region 05/03    MRI  . Erectile dysfunction 09/27/2006    s/p penile implant  . Anemia, iron deficiency 09/27/2006    neg colonoscopy 2006 - Dr. Elnoria Howard; ferritin 152; hgb  15.7  on 07/07  . Hypothyroidism 09/27/2006    TSH  2.639  07/07  . Hypersomnia 09/27/2006    evaluated by Dr. Jetty Duhamel (5/08) and Dr. Vickey Huger; PSG 8/09: chronic delayed sleep phase syndrome (patient sleeps during the day and is awake at night), nocturnal myoclonus (eval for RLS and IDA suggested). Pt advised to change sleeping behavior  . Diabetes mellitus type II 09/07/1984    poorly controlled, complicated by peripheral neuropathy, microalbuminuria, mild non proliferative retinopathy, s/p DKA 7/05, on insulin pump x 4/09, started a pump vacation on 11/19/2008  . Hyperlipidemia   . 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 Hemorrhoids   . Chronic kidney disease   . Unspecified hereditary and idiopathic peripheral neuropathy   . Hyperpotassemia   . Other testicular hypofunction   . Impotence of organic origin   . Other chronic pain   . Other malaise and fatigue   . Unspecified hypothyroidism   . Type II or unspecified type diabetes mellitus with neurological manifestations, not stated as uncontrolled   . Other B-complex deficiencies   . Other organic hypersomnia   . Type I (juvenile type) diabetes mellitus without mention of complication, not stated as uncontrolled   . Depression   . Attention deficit disorder with hyperactivity(314.01)   . Hypertrophy of prostate without urinary obstruction and other lower urinary tract symptoms (LUTS)   . Lumbago   . ADHD, adult residual type 05/31/2013    Past Surgical History  Procedure Laterality Date  . Penile prosthesis implant    . Tonsillectomy    . Colonoscopy  06/25/2005    Dr. Jeani Hawking    Family History  Problem  Relation Age of Onset  . Bone cancer Mother   . Breast cancer Mother   . Cancer Mother   . Lung cancer Father   . Cancer Father   . Breast cancer Sister   . Cancer Sister   . Lung cancer Brother   . Cancer Sister     type unknown    Social History:  reports that he quit smoking about 9 years ago. His smoking use included Cigarettes. He smoked 0.00 packs per day. He has never used smokeless tobacco. He reports that he does not drink alcohol or use illicit drugs.    Review of Systems:   Weight loss: He has lost about 10 pounds in the last 3 months or so despite a fairly good appetite. Overall has cut back on caloric intake from eliminating alcoholic drinks like Margaritas  ANEMIA: His hemoglobin  Has been low and this is followed by hematologist   He has been diagnosed to have hypogonadism and is  being treated by his urologist with injections every 2 weeks  He has had mild hypertension followed by PCP. Today in the shower he was feeling lightheaded. Blood pressure is lower than usual now  History of chronic diarrhea of unclear etiology which is not any worse  Has had BPH with significant nocturia   History of hypothyroidism, currently taking 75 mcg   Lab Results  Component Value Date   TSH 1.06 11/27/2013   Has good control of his lipids  Lab Results  Component Value Date   CHOL 168 11/27/2013   HDL 55.50 11/27/2013   LDLCALC 89 11/27/2013   TRIG 118.0 11/27/2013   CHOLHDL 3 11/27/2013    Physical Examination:  BP 122/62  Pulse 64  Temp(Src) 98 F (36.7 C)  Resp 12  Ht 5\' 5"  (1.651 m)  Wt 123 lb 9.6 oz (56.065 kg)  BMI 20.57 kg/m2  SpO2  93%         ASSESSMENT:  Diabetes type 1:   His insulin requirement appears to be less recently especially fasting hypoglycemia last week He probably has had a longer duration of action of his Lantus which may have caused his hyperglycemia in the evening Although he has been using the continuous glucose monitor this has not  prevented him from getting severe hypoglycemia since he has difficulty responding adequately when his blood sugar is low Other factors causing hyperglycemia:  Significant weight loss in the last few weeks   reducing a lot of alcoholic drinks  Possibly has some renal insufficiency and his creatinine last week was 1.4  Since he was seen by the ER physician his blood sugars have been much higher without any basal insulin and asked him not to stop his basal insulin completely in the future  PLAN:    Take Lantus only in the mornings as before using 7 units and also resume NPH at bedtime, 3 units  Continue Humalog as before  Will have him sign an application to get a Medtronic pump with the threshold suspend feature. Discussed how this pump works and the benefits of threshold suspend for his hypoglycemia tendency  Review blood sugar again in 2 weeks  Check thyroid and creatinine levels today  To call if he has poor control in the next week or so  Continue to abstain from mixed alcoholic drinks  Followup with PCP for evaluating weight loss  Stop HCTZ as blood pressure is low normal  Counseling time over 50% of today's 25 minute visit  Reather LittlerAjay Ellamarie Naeve 01/23/2014, 8:29 AM

## 2014-01-23 NOTE — Progress Notes (Signed)
The patient is here today with his daughter-in-law because he had a severe low blood sugar.  He is wanting to order a Medtronic insulin pump, and would like help in inserting the Dexcom CGM sensor. He signed paperwork for a Medtronic insulin pump, and we reviewed how to insert the sensor, and he did this while talking him through this.   He will call me when his pump comes in and we will arrange a time for the pump start.  I encouraged his daughter-in- law to come as well.  She agreed to this.

## 2014-01-23 NOTE — Addendum Note (Signed)
Addended by: Reather Littler on: 01/23/2014 11:52 AM   Modules accepted: Orders

## 2014-01-23 NOTE — Patient Instructions (Addendum)
Resume Lantus 7 in am and 3 N at night, same Humalog  Stop HCTZ  Look into Medtronic pump with Enlite sensor

## 2014-01-24 NOTE — Telephone Encounter (Signed)
Called pt to inform him that his Rx was ready to be picked up at the front desk and if he has any other problems, questions or concerns to call the office. Pt verbalized understanding. °

## 2014-02-07 ENCOUNTER — Other Ambulatory Visit: Payer: Self-pay | Admitting: *Deleted

## 2014-02-07 DIAGNOSIS — M545 Low back pain, unspecified: Secondary | ICD-10-CM

## 2014-02-07 MED ORDER — HYDROCODONE-ACETAMINOPHEN 5-325 MG PO TABS: ORAL_TABLET | ORAL | Status: DC

## 2014-02-08 ENCOUNTER — Encounter: Payer: Self-pay | Admitting: Endocrinology

## 2014-02-08 ENCOUNTER — Ambulatory Visit (INDEPENDENT_AMBULATORY_CARE_PROVIDER_SITE_OTHER): Payer: Medicare Other | Admitting: Endocrinology

## 2014-02-08 VITALS — BP 118/74 | HR 61 | Temp 98.3°F | Resp 14 | Ht 65.0 in | Wt 124.4 lb

## 2014-02-08 DIAGNOSIS — E1065 Type 1 diabetes mellitus with hyperglycemia: Secondary | ICD-10-CM

## 2014-02-08 DIAGNOSIS — N289 Disorder of kidney and ureter, unspecified: Secondary | ICD-10-CM

## 2014-02-08 DIAGNOSIS — I10 Essential (primary) hypertension: Secondary | ICD-10-CM

## 2014-02-08 DIAGNOSIS — E039 Hypothyroidism, unspecified: Secondary | ICD-10-CM

## 2014-02-08 DIAGNOSIS — IMO0002 Reserved for concepts with insufficient information to code with codable children: Secondary | ICD-10-CM

## 2014-02-08 NOTE — Patient Instructions (Addendum)
Extra 20 g for hi fat meal and 40 carbs for mixed drinks  6 Lantus daily. 3 N at bedtime

## 2014-02-08 NOTE — Progress Notes (Signed)
Patient ID: Benjamin Hodge, male   DOB: [birth year], 73 y.o.   MRN: [id number]   Reason for Appointment : Follow up for Type 1 Diabetes  History of Present Illness          Diagnosis: Type 1 diabetes mellitus, date of diagnosis: 1986         Past history: Patient has had very labile diabetes and generally difficult to control for several years. He had previously been tried on an Accu-Chek pump also which did not appear to be unofficial and he was not very keen on continuing Has been tried on basal bolus insulin regimen for several years. His insulin has been changed continuously without achieving good control. Has been tried on Levemir instead of Lantus without any significant benefit. Also because of tendency to markedly increased blood sugars overnight he has been started on NPH insulin at bedtime along with his twice a day Lantus  INSULIN regimen is described as: LANTUS 7 units at breakfast. Humulin N 3 at bedtime,   Humalog 1:20 carbohydrate coverage, usually 3 in am and 5 at supper;  correction 1:50-100  Recent history:  Because of his severe hypoglycemia in 5/15 his evening Lantus was stopped Appears to be requiring less basal insulin possibly because of his weight loss Also had worsening renal function possibly related to taking HCTZ More recently his blood sugars have been relatively better although still labile  Glucose patterns:  Fasting blood sugars are overall high and averaging about 175 for the last 2 weeks but low normal the last 3 mornings including mild hypoglycemia on 2 mornings are seen on the continuous glucose monitoring tracing and fingerstick of 52  Postprandial readings are minimally higher after breakfast except on one occasion but generally higher after his evening meal. Highest blood sugar was on Saturday night related to drinking mixed drinks without any extra coverage  However his blood sugars appear to be getting higher right before supper also for no  apparent reason. Average blood sugar overnight is 289  Blood sugars are relatively steady in between meals in the early evenings  CGM RECORD INTERPRETATION    Dates of Recording: 02/02/14 through 02/08/14          Sensor  summary:  Quality of the data is excellent with adequate sensor function. Glucose excursion profile shows patterns of nighttime hives and lowest portions present only on one of the days. Overall glucose average 189 with standard deviation 77 and 77% of readings above target and 7% low      Glycemic patterns:   blood sugars are mostly significantly high between about midnight-7 AM and overall relatively lower rest of the day. Blood sugars appear to spike around 2-3 AM      Overnight periods:   blood sugars are gradually declining after about 3 AM and are variable before his waking up time of about 1 PM      Preprandial periods:   blood sugars are variable but reasonably good with an average of about 145 and at 1 PM. Blood sugars are relatively higher before evening meal at 12-1 AM when blood sugars are averaging 190 and 250      Postprandial periods:   blood sugars increase after breakfast only mildly but significantly higher after evening meal when the blood sugars are peaking around 300 on most days; glucose is averaging over 200 between 1 AM-5 AM      Hypoglycemia:  mild hypoglycemia is present only on 6/3 at about 5-6  AM transiently and again before noon; also blood sugar low normal at 2-3 PM on 6/2 correlating with his fingerstick of 58    Self-care: The diet that the patient has been following is: Usually eating frozen meal at dinnertime which is relatively high fat  Meals: 2 meals per day.  breakfast is high fat with fruit, usually 60 g carb am and 100g dinner  He had been eliminating alcoholic drinks like Margaritas  Physical activity: exercise: No programmed activity.           Wt Readings from Last 3 Encounters:  02/08/14 124 lb 6.4 oz (56.427 kg)  01/23/14 123 lb  9.6 oz (56.065 kg)  01/15/14 124 lb 3.2 oz (56.337 kg)   Lab Results  Component Value Date   HGBA1C 7.3* 11/27/2013   HGBA1C 7.8* 07/20/2013   HGBA1C 9.4* 04/20/2013   Lab Results  Component Value Date   MICROALBUR 2.0* 11/27/2013   LDLCALC 89 11/27/2013   CREATININE 2.1* 01/23/2014       Medication List       This list is accurate as of: 02/08/14  4:29 PM.  Always use your most recent med list.               aspirin 81 MG tablet  Take 81 mg by mouth daily.     diphenoxylate-atropine 2.5-0.025 MG per tablet  Commonly known as:  LOMOTIL  Take 1 tablet by mouth 3 (three) times daily as needed for diarrhea or loose stools.     escitalopram 20 MG tablet  Commonly known as:  LEXAPRO  Take 1 tablet (20 mg total) by mouth daily.     gabapentin 600 MG tablet  Commonly known as:  NEURONTIN  Take 1/2  tablet by mouth three times daily     hydrochlorothiazide 25 MG tablet  Commonly known as:  HYDRODIURIL  Take 25 mg by mouth daily.     HYDROcodone-acetaminophen 5-325 MG per tablet  Commonly known as:  NORCO/VICODIN  Take one tablet by mouth five times daily as needed to control pain     Insulin Glargine 100 UNIT/ML Solostar Pen  Commonly known as:  LANTUS  Take 7 units in the morning and 3 units at bedtime     insulin lispro 100 UNIT/ML KiwkPen  Commonly known as:  HUMALOG  Inject 8-10 Units into the skin daily as needed (after meals).     Insulin NPH (Human) (Isophane) 100 UNIT/ML Kiwkpen  Commonly known as:  HUMULIN N  Inject 3 Units into the skin every evening.     levothyroxine 75 MCG tablet  Commonly known as:  SYNTHROID, LEVOTHROID  Take one tablet by mouth once daily for thyroid     methylphenidate 20 MG tablet  Commonly known as:  RITALIN  Take 1 tablet (20 mg total) by mouth 2 (two) times daily.     metoprolol tartrate 25 MG tablet  Commonly known as:  LOPRESSOR  Take 25 mg by mouth 2 (two) times daily.     ONE TOUCH ULTRA TEST test strip  Generic  drug:  glucose blood     testosterone cypionate 200 MG/ML injection  Commonly known as:  DEPOTESTOTERONE CYPIONATE  Every 2 weeks, administered by Urologist     vitamin B-12 1000 MCG tablet  Commonly known as:  CYANOCOBALAMIN  Take 1,000 mcg by mouth daily.        Allergies:  Allergies  Allergen Reactions  . Ace Inhibitors Other (See Comments)  dizzy  . Angiotensin Receptor Blockers Other (See Comments)    unknown  . Bee Venom Swelling  . Cymbalta [Duloxetine Hcl] Other (See Comments)    dizzy    Past Medical History  Diagnosis Date  . Shoulder pain, left   . Malnutrition 11/28/2008  . Right upper lobe pneumonia 11/28/2008  . Anxiety 07/18/2008  . Irritable bowel syndrome 04/27/2008  . Nocturia 03/13/2008  . COPD (chronic obstructive pulmonary disease) 02/27/2008  . B12 deficiency 02/16/2008    in 5/09: B12 262, MMA 1040  . Chronic diarrhea 01/01/2008    s/p EGD 10/06: H pylori + gastritis, duodenal biopsy normal (Dr. Elnoria HowardHung); s/p colonoscopy 10/06: hemorrhoids (Dr. Elnoria HowardHung); evaluated by Dr. Yancey FlemingsJohn Perry  in 8/09: tissue transglutaminase AB negative, VIP normal, stool fat content normal, urine 5-HIAA normal; normal GES 11/09 (done for "fluctuating sugars")  . 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 Leg pain, right 09/29/2007  . Fatigue 02/28/2007  . 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 Hyperkalemia 02/09/2007  . Orthostatic hypotension 02/09/2007  . Hypertension 02/09/2007  . Spinal stenosis in cervical region 05/03    MRI  . Erectile dysfunction 09/27/2006    s/p penile implant  . Anemia, iron deficiency 09/27/2006    neg colonoscopy 2006 - Dr. Elnoria HowardHung; ferritin 152; hgb  15.7  on 07/07  . Hypothyroidism 09/27/2006    TSH 2.639  07/07  . Hypersomnia 09/27/2006    evaluated by Dr. Jetty Duhamellinton Young (5/08) and Dr. Vickey Hugerohmeier; PSG 8/09: chronic delayed sleep phase syndrome (patient sleeps during the day and is awake at night), nocturnal  myoclonus (eval for RLS and IDA suggested). Pt advised to change sleeping behavior  . Diabetes mellitus type II 09/07/1984    poorly controlled, complicated by peripheral neuropathy, microalbuminuria, mild non proliferative retinopathy, s/p DKA 7/05, on insulin pump x 4/09, started a pump vacation on 11/19/2008  . Hyperlipidemia   . 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 Hemorrhoids   . Chronic kidney disease   . Unspecified hereditary and idiopathic peripheral neuropathy   . Hyperpotassemia   . Other testicular hypofunction   . Impotence of organic origin   . Other chronic pain   . Other malaise and fatigue   . Unspecified hypothyroidism   . Type II or unspecified type diabetes mellitus with neurological manifestations, not stated as uncontrolled   . Other B-complex deficiencies   . Other organic hypersomnia   . Type I (juvenile type) diabetes mellitus without mention of complication, not stated as uncontrolled   . Depression   . Attention deficit disorder with hyperactivity(314.01)   . Hypertrophy of prostate without urinary obstruction and other lower urinary tract symptoms (LUTS)   . Lumbago   . ADHD, adult residual type 05/31/2013    Past Surgical History  Procedure Laterality Date  . Penile prosthesis implant    . Tonsillectomy    . Colonoscopy  06/25/2005    Dr. Jeani HawkingPatrick Hung    Family History  Problem Relation Age of Onset  . Bone cancer Mother   . Breast cancer Mother   . Cancer Mother   . Lung cancer Father   . Cancer Father   . Breast cancer Sister   . Cancer Sister   . Lung cancer Brother   . Cancer Sister     type unknown    Social History:  reports that he quit smoking about 9 years ago. His smoking use included Cigarettes. He smoked 0.00 packs per day. He has never used smokeless  tobacco. He reports that he does not drink alcohol or use illicit drugs.    Review of Systems:   Weight loss: He has lost about 10 pounds in the last 3 months or so despite a fairly good  appetite. Overall has cut back on caloric intake from eliminating alcoholic drinks like Margaritas  ANEMIA: His hemoglobin  Has been low and this is followed by hematologist   He has been diagnosed to have hypogonadism and is  being treated by his urologist with injections every 2 weeks  He has had mild hypertension followed by PCP. Today in the shower he was feeling lightheaded. Blood pressure is lower than usual now  History of chronic diarrhea of unclear etiology which is not any worse  Has had BPH with significant nocturia   History of hypothyroidism, currently taking 75 mcg   Lab Results  Component Value Date   TSH 1.43 01/23/2014   Has good control of his lipids  Lab Results  Component Value Date   CHOL 168 11/27/2013   HDL 55.50 11/27/2013   LDLCALC 89 11/27/2013   TRIG 118.0 11/27/2013   CHOLHDL 3 11/27/2013    Physical Examination:  BP 118/74  Pulse 61  Temp(Src) 98.3 F (36.8 C)  Resp 14  Ht 5\' 5"  (1.651 m)  Wt 124 lb 6.4 oz (56.427 kg)  BMI 20.70 kg/m2  SpO2 94%         ASSESSMENT:  Diabetes type 1:   His basal insulin requirement appears to be less recently  Even with reducing his Lantus by 2 units on the last visit he is now waking up with relatively low readings in the last 3 days although hypoglycemia is mild His overall control is limited by his tendency to severe hypoglycemia and hypoglycemia unawareness As discussed in history of present illness his postprandial readings and also readings right before his evening meal are significantly high, previously this was related to snacks and alcoholic drinks but he has had alcoholic drinks only on one night which caused marked hyperglycemia after supper Other factors limiting his level of control: He may not be having some difficulties with remembering and also taking action for changes in blood sugars His evening meals are relatively high fat and need to be covered with extra insulin The prolonged  hypoglycemia appears to be somewhat better with adding NPH also with his evening meal He has now agreed to try the insulin pump and will proceed with getting this facilitated  RENAL INSUFFICIENCY: This may have been related to relatively low blood pressure and his HCTZ has been stopped  PLAN:    Take Lantus 6 units instead of 7  Extra 20 g for high fat content for calculating total carbohydrates in the evening meal and 40 carbs for mixed drinks  Cover evening snacks if having any  Make sure and take Humalog insulin right before eating  Check C-peptide in preparation for getting the insulin pump  To check her renal function again  Counseling time over 50% of today's 25 minute visit  Reather Littler 02/08/2014, 4:29 PM    Addendum: His potassium is high, creatinine back to normal, needs dietary modification

## 2014-02-09 ENCOUNTER — Other Ambulatory Visit (INDEPENDENT_AMBULATORY_CARE_PROVIDER_SITE_OTHER): Payer: Medicare Other

## 2014-02-09 DIAGNOSIS — IMO0002 Reserved for concepts with insufficient information to code with codable children: Secondary | ICD-10-CM

## 2014-02-09 DIAGNOSIS — E1065 Type 1 diabetes mellitus with hyperglycemia: Secondary | ICD-10-CM

## 2014-02-09 LAB — BASIC METABOLIC PANEL
BUN: 22 mg/dL (ref 6–23)
CALCIUM: 8.9 mg/dL (ref 8.4–10.5)
CO2: 23 meq/L (ref 19–32)
Chloride: 107 mEq/L (ref 96–112)
Creatinine, Ser: 1 mg/dL (ref 0.4–1.5)
GFR: 74.82 mL/min (ref >60.00)
GLUCOSE: 194 mg/dL — AB (ref 70–99)
Potassium: 5.4 mEq/L — ABNORMAL HIGH (ref 3.5–5.1)
Sodium: 138 mEq/L (ref 135–145)

## 2014-02-09 LAB — GLUCOSE, RANDOM: Glucose, Bld: 194 mg/dL — ABNORMAL HIGH (ref 70–99)

## 2014-02-10 LAB — C-PEPTIDE: C-Peptide: 0 ng/mL — ABNORMAL LOW (ref 0.80–3.90)

## 2014-02-12 LAB — FRUCTOSAMINE: Fructosamine: 443 umol/L — ABNORMAL HIGH (ref 190–270)

## 2014-02-23 ENCOUNTER — Other Ambulatory Visit: Payer: Medicare Other

## 2014-02-26 ENCOUNTER — Encounter: Payer: Self-pay | Admitting: Internal Medicine

## 2014-02-26 ENCOUNTER — Ambulatory Visit (INDEPENDENT_AMBULATORY_CARE_PROVIDER_SITE_OTHER): Payer: Medicare Other | Admitting: Internal Medicine

## 2014-02-26 VITALS — BP 164/78 | HR 76 | Wt 126.0 lb

## 2014-02-26 DIAGNOSIS — E291 Testicular hypofunction: Secondary | ICD-10-CM

## 2014-02-26 DIAGNOSIS — S92902S Unspecified fracture of left foot, sequela: Secondary | ICD-10-CM

## 2014-02-26 DIAGNOSIS — E1043 Type 1 diabetes mellitus with diabetic autonomic (poly)neuropathy: Secondary | ICD-10-CM

## 2014-02-26 DIAGNOSIS — G909 Disorder of the autonomic nervous system, unspecified: Secondary | ICD-10-CM

## 2014-02-26 DIAGNOSIS — R7989 Other specified abnormal findings of blood chemistry: Secondary | ICD-10-CM

## 2014-02-26 DIAGNOSIS — E1142 Type 2 diabetes mellitus with diabetic polyneuropathy: Secondary | ICD-10-CM

## 2014-02-26 DIAGNOSIS — M545 Low back pain, unspecified: Secondary | ICD-10-CM | POA: Insufficient documentation

## 2014-02-26 DIAGNOSIS — S8290XS Unspecified fracture of unspecified lower leg, sequela: Secondary | ICD-10-CM

## 2014-02-26 DIAGNOSIS — E1049 Type 1 diabetes mellitus with other diabetic neurological complication: Secondary | ICD-10-CM

## 2014-02-26 DIAGNOSIS — S92902A Unspecified fracture of left foot, initial encounter for closed fracture: Secondary | ICD-10-CM | POA: Insufficient documentation

## 2014-02-26 MED ORDER — PREGABALIN 50 MG PO CAPS: 50.0000 mg | ORAL_CAPSULE | Freq: Three times a day (TID) | ORAL | Status: DC

## 2014-02-26 NOTE — Progress Notes (Signed)
Patient ID: Benjamin Hodge, male   DOB: Mar 13, 1941, 73 y.o.   MRN: [id number]   Location:  [facility]  Code Status: need to discuss advance directive with him   Allergies  Allergen Reactions  . Ace Inhibitors Other (See Comments)    dizzy  . Angiotensin Receptor Blockers Other (See Comments)    unknown  . Bee Venom Swelling  . Cymbalta [Duloxetine Hcl] Other (See Comments)    dizzy    Chief Complaint  Patient presents with  . Medical Management of Chronic Issues    blood sugar, depression, blood pressure    HPI: Patient is a 73 y.o.white male seen in the office today for medical mgt of chronic diseases.  Left foot is still not right after breaking bones in foot.  He had initially been seen at [facility] and put in a boot, but didn't wear the boot for a long time.  Did go to orthopedics and says they said he didn't need to wear it anymore.  Does not have pain, only very minimal swelling.    Had infection of left foot blister from boot and took doxycycline for it and resolved in April.    Talking about severe pain from neuropathy at night in feet.  Gabapentin 300mg  po tid not working as well as lyrica did--does not know his previous dose.    Had hypoglycemia and went to ED with it (28).  Long acting insulin stopped at that time.  Followed up with Dr. Lucianne MussKumar.  Was put back on just lantus 6 units daily.    Review of Systems:  Review of Systems  Constitutional: Negative for fever.  Eyes: Negative for blurred vision.  Respiratory: Negative for shortness of breath.   Cardiovascular: Negative for chest pain.  Gastrointestinal: Negative for heartburn and constipation.  Genitourinary: Negative for dysuria.  Musculoskeletal: Positive for back pain. Negative for falls.       Back is hurting worse--says it gets really bad and his neck and shoulders also hurt  Skin: Negative for rash.  Neurological: Positive for sensory change. Negative  for dizziness, loss of consciousness, weakness and headaches.  Endo/Heme/Allergies: Bruises/bleeds easily.  Psychiatric/Behavioral: Negative for depression and memory loss.    Past Medical History  Diagnosis Date  . Shoulder pain, left   . Malnutrition 11/28/2008  . Right upper lobe pneumonia 11/28/2008  . Anxiety 07/18/2008  . Irritable bowel syndrome 04/27/2008  . Nocturia 03/13/2008  . COPD (chronic obstructive pulmonary disease) 02/27/2008  . B12 deficiency 02/16/2008    in 5/09: B12 262, MMA 1040  . Chronic diarrhea 01/01/2008    s/p EGD 10/06: H pylori + gastritis, duodenal biopsy normal (Dr. Elnoria HowardHung); s/p colonoscopy 10/06: hemorrhoids (Dr. Elnoria HowardHung); evaluated by Dr. Yancey FlemingsJohn Perry  in 8/09: tissue transglutaminase AB negative, VIP normal, stool fat content normal, urine 5-HIAA normal; normal GES 11/09 (done for "fluctuating sugars")  . 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 Leg pain, right 09/29/2007  . Fatigue 02/28/2007  . 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 Hyperkalemia 02/09/2007  . Orthostatic hypotension 02/09/2007  . Hypertension 02/09/2007  . Spinal stenosis in cervical region 05/03    MRI  . Erectile dysfunction 09/27/2006    s/p penile implant  . Anemia, iron deficiency 09/27/2006    neg colonoscopy 2006 - Dr. Elnoria HowardHung; ferritin 152; hgb  15.7  on 07/07  . Hypothyroidism 09/27/2006    TSH 2.639  07/07  . Hypersomnia 09/27/2006  evaluated by Dr. Jetty Duhamel (5/08) and Dr. Vickey Huger; PSG 8/09: chronic delayed sleep phase syndrome (patient sleeps during the day and is awake at night), nocturnal myoclonus (eval for RLS and IDA suggested). Pt advised to change sleeping behavior  . Diabetes mellitus type II 09/07/1984    poorly controlled, complicated by peripheral neuropathy, microalbuminuria, mild non proliferative retinopathy, s/p DKA 7/05, on insulin pump x 4/09, started a pump vacation on 11/19/2008  . Hyperlipidemia   . Hypogonadism  male 08/2011  . Hemorrhoids   . Chronic kidney disease   . Unspecified hereditary and idiopathic peripheral neuropathy   . Hyperpotassemia   . Other testicular hypofunction   . Impotence of organic origin   . Other chronic pain   . Other malaise and fatigue   . Unspecified hypothyroidism   . Type II or unspecified type diabetes mellitus with neurological manifestations, not stated as uncontrolled   . Other B-complex deficiencies   . Other organic hypersomnia   . Type I (juvenile type) diabetes mellitus without mention of complication, not stated as uncontrolled   . Depression   . Attention deficit disorder with hyperactivity(314.01)   . Hypertrophy of prostate without urinary obstruction and other lower urinary tract symptoms (LUTS)   . Lumbago   . ADHD, adult residual type 05/31/2013    Past Surgical History  Procedure Laterality Date  . Penile prosthesis implant    . Tonsillectomy    . Colonoscopy  06/25/2005    Dr. Jeani Hawking    Social History:   reports that he quit smoking about 9 years ago. His smoking use included Cigarettes. He smoked 0.00 packs per day. He has never used smokeless tobacco. He reports that he does not drink alcohol or use illicit drugs.  Family History  Problem Relation Age of Onset  . Bone cancer Mother   . Breast cancer Mother   . Cancer Mother   . Lung cancer Father   . Cancer Father   . Breast cancer Sister   . Cancer Sister   . Lung cancer Brother   . Cancer Sister     type unknown    Medications: Patient's Medications  New Prescriptions   No medications on file  Previous Medications   ASPIRIN 81 MG TABLET    Take 81 mg by mouth daily.    DIPHENOXYLATE-ATROPINE (LOMOTIL) 2.5-0.025 MG PER TABLET    Take 1 tablet by mouth 3 (three) times daily as needed for diarrhea or loose stools.   ESCITALOPRAM (LEXAPRO) 20 MG TABLET    Take 1 tablet (20 mg total) by mouth daily.   GABAPENTIN (NEURONTIN) 600 MG TABLET    Take 1/2  tablet by mouth  three times daily   HYDROCODONE-ACETAMINOPHEN (NORCO/VICODIN) 5-325 MG PER TABLET    Take one tablet by mouth five times daily as needed to control pain   INSULIN GLARGINE (LANTUS) 100 UNIT/ML SOLOSTAR PEN    Take 7 units in the morning and 3 units at bedtime   INSULIN ISOPHANE HUMAN (HUMULIN N) 100 UNIT/ML KIWKPEN    Inject 3 Units into the skin every evening.    INSULIN LISPRO (HUMALOG) 100 UNIT/ML KIWKPEN    Inject 8-10 Units into the skin daily as needed (after meals).    LEVOTHYROXINE (SYNTHROID, LEVOTHROID) 75 MCG TABLET    Take one tablet by mouth once daily for thyroid   METHYLPHENIDATE (RITALIN) 20 MG TABLET    Take 1 tablet (20 mg total) by mouth 2 (two) times daily.  METOPROLOL TARTRATE (LOPRESSOR) 25 MG TABLET    Take 25 mg by mouth 2 (two) times daily.   ONE TOUCH ULTRA TEST TEST STRIP       TESTOSTERONE CYPIONATE (DEPOTESTOTERONE CYPIONATE) 200 MG/ML INJECTION    Every 2 weeks, administered by Urologist   VITAMIN B-12 (CYANOCOBALAMIN) 1000 MCG TABLET    Take 1,000 mcg by mouth daily.  Modified Medications   No medications on file  Discontinued Medications   HYDROCHLOROTHIAZIDE (HYDRODIURIL) 25 MG TABLET    Take 25 mg by mouth daily.   Physical Exam: Filed Vitals:   02/26/14 1535  BP: 164/78  Pulse: 76  Weight: 126 lb (57.153 kg)  Physical Exam  Constitutional: He is oriented to person, place, and time. He appears well-developed and well-nourished.  Cardiovascular: Normal rate, regular rhythm, normal heart sounds and intact distal pulses.   Pulmonary/Chest: Effort normal and breath sounds normal. No respiratory distress.  Abdominal: Soft. Bowel sounds are normal. He exhibits no distension and no mass. There is no tenderness.  Musculoskeletal: Normal range of motion.  Neurological: He is alert and oriented to person, place, and time.  Psychiatric:  Talks fast, tangential    Labs reviewed: Basic Metabolic Panel:  Recent Labs  16/10/96 1646 11/27/13 1555   01/19/14 0129 01/23/14 0900 02/09/14 1417  NA 146* 143  < > 143 134* 138  K 5.3* 5.0  < > 4.6 4.9 5.4*  CL 105 108  --  105 98 107  CO2 26 28  < > 25 26 23   GLUCOSE 53* 180*  < > 24* 321* 194*  194*  BUN 16 28*  < > 22 40* 22  CREATININE 1.03 1.6*  < > 1.41* 2.1* 1.0  CALCIUM 9.4 8.8  < > 9.3 9.1 8.9  TSH 1.700 1.06  --   --  1.43  --   < > = values in this interval not displayed. Liver Function Tests:  Recent Labs  05/22/13 1702 11/29/13 1507 01/19/14 0129  AST 25 22 29   ALT 21 20 20   ALKPHOS 56 65 64  BILITOT 0.6 0.46 0.5  PROT 6.2 6.6 7.5  ALBUMIN 3.7 3.9 4.6  CBC:  Recent Labs  04/24/13 1511 07/20/13 1646 11/29/13 1507 01/19/14 0129  WBC 7.4 6.6 5.3 8.1  NEUTROABS 5.2 4.3 3.6 5.1  HGB 10.7* 12.1* 11.2* 12.6*  HCT 31.5* 37.3* 34.4* 39.4  MCV 98.1* 101* 102.1* 105.9*  PLT 204  --  215 272   Lipid Panel:  Recent Labs  11/27/13 1555  CHOL 168  HDL 55.50  LDLCALC 89  TRIG 118.0  CHOLHDL 3   Lab Results  Component Value Date   HGBA1C 7.3* 11/27/2013   Assessment/Plan 1. Low back pain potentially associated with spinal stenosis - more difficulty lately--will try lyrica - pregabalin (LYRICA) 50 MG capsule; Take 1 capsule (50 mg total) by mouth 3 (three) times daily.  Dispense: 90 capsule; Refill: 3  2. Closed fracture of left foot, sequela - fell from house to porch and has not been adherent with boot use recommended by the orthopedic surgeon, pt says his foot is getting better -advised to keep scheduled f/u appts  3. Low serum testosterone level -to be managed by his urologist  4. Polyneuropathy in diabetes(357.2) - worse lately, try lyrica - pregabalin (LYRICA) 50 MG capsule; Take 1 capsule (50 mg total) by mouth 3 (three) times daily.  Dispense: 90 capsule; Refill: 3  5. 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 Lucianne Muss primarily, but pt always calls here when his sugars are low or  high--reminded him to call Dr. Lucianne Muss about this - pregabalin (LYRICA) 50 MG capsule; Take 1 capsule (50 mg total) by mouth 3 (three) times daily.  Dispense: 90 capsule; Refill: 3  Labs/tests ordered:  Has labs ordered by Dr. Lucianne Muss and I can now see his notes and labs  Next appt:  4 mos

## 2014-02-26 NOTE — Patient Instructions (Signed)
Start with lyrica 50mg  with supper and gradually increase up to three times daily if you find once a day does not give you adequately.

## 2014-02-28 ENCOUNTER — Ambulatory Visit: Payer: Medicare Other | Admitting: Endocrinology

## 2014-03-07 DIAGNOSIS — I219 Acute myocardial infarction, unspecified: Secondary | ICD-10-CM

## 2014-03-07 HISTORY — DX: Acute myocardial infarction, unspecified: I21.9

## 2014-03-08 ENCOUNTER — Other Ambulatory Visit: Payer: Self-pay

## 2014-03-08 DIAGNOSIS — M545 Low back pain, unspecified: Secondary | ICD-10-CM

## 2014-03-08 MED ORDER — HYDROCODONE-ACETAMINOPHEN 5-325 MG PO TABS: ORAL_TABLET | ORAL | Status: DC

## 2014-03-10 ENCOUNTER — Inpatient Hospital Stay (HOSPITAL_COMMUNITY)
Admission: EM | Admit: 2014-03-10 | Discharge: 2014-03-13 | DRG: 280 | Disposition: A | Discharge status: Home / Self Care | Payer: Medicare Other | Attending: Internal Medicine | Admitting: Internal Medicine

## 2014-03-10 ENCOUNTER — Emergency Department (HOSPITAL_COMMUNITY): Payer: Medicare Other

## 2014-03-10 ENCOUNTER — Encounter (HOSPITAL_COMMUNITY): Payer: Self-pay | Admitting: Emergency Medicine

## 2014-03-10 DIAGNOSIS — R197 Diarrhea, unspecified: Secondary | ICD-10-CM

## 2014-03-10 DIAGNOSIS — I714 Abdominal aortic aneurysm, without rupture, unspecified: Secondary | ICD-10-CM | POA: Diagnosis present

## 2014-03-10 DIAGNOSIS — J449 Chronic obstructive pulmonary disease, unspecified: Secondary | ICD-10-CM | POA: Diagnosis present

## 2014-03-10 DIAGNOSIS — M545 Low back pain, unspecified: Secondary | ICD-10-CM

## 2014-03-10 DIAGNOSIS — E11329 Type 2 diabetes mellitus with mild nonproliferative diabetic retinopathy without macular edema: Secondary | ICD-10-CM | POA: Diagnosis present

## 2014-03-10 DIAGNOSIS — Z801 Family history of malignant neoplasm of trachea, bronchus and lung: Secondary | ICD-10-CM

## 2014-03-10 DIAGNOSIS — I739 Peripheral vascular disease, unspecified: Secondary | ICD-10-CM | POA: Diagnosis present

## 2014-03-10 DIAGNOSIS — Z79899 Other long term (current) drug therapy: Secondary | ICD-10-CM

## 2014-03-10 DIAGNOSIS — Z91199 Patient's noncompliance with other medical treatment and regimen due to unspecified reason: Secondary | ICD-10-CM

## 2014-03-10 DIAGNOSIS — N529 Male erectile dysfunction, unspecified: Secondary | ICD-10-CM | POA: Diagnosis present

## 2014-03-10 DIAGNOSIS — R799 Abnormal finding of blood chemistry, unspecified: Secondary | ICD-10-CM | POA: Diagnosis present

## 2014-03-10 DIAGNOSIS — I2510 Atherosclerotic heart disease of native coronary artery without angina pectoris: Secondary | ICD-10-CM | POA: Diagnosis present

## 2014-03-10 DIAGNOSIS — I10 Essential (primary) hypertension: Secondary | ICD-10-CM | POA: Diagnosis present

## 2014-03-10 DIAGNOSIS — IMO0002 Reserved for concepts with insufficient information to code with codable children: Secondary | ICD-10-CM

## 2014-03-10 DIAGNOSIS — E119 Type 2 diabetes mellitus without complications: Secondary | ICD-10-CM

## 2014-03-10 DIAGNOSIS — I959 Hypotension, unspecified: Secondary | ICD-10-CM | POA: Diagnosis present

## 2014-03-10 DIAGNOSIS — Z91038 Other insect allergy status: Secondary | ICD-10-CM

## 2014-03-10 DIAGNOSIS — E1065 Type 1 diabetes mellitus with hyperglycemia: Secondary | ICD-10-CM | POA: Diagnosis present

## 2014-03-10 DIAGNOSIS — R7401 Elevation of levels of liver transaminase levels: Secondary | ICD-10-CM

## 2014-03-10 DIAGNOSIS — N058 Unspecified nephritic syndrome with other morphologic changes: Secondary | ICD-10-CM | POA: Diagnosis present

## 2014-03-10 DIAGNOSIS — J438 Other emphysema: Secondary | ICD-10-CM | POA: Diagnosis present

## 2014-03-10 DIAGNOSIS — F908 Attention-deficit hyperactivity disorder, other type: Secondary | ICD-10-CM

## 2014-03-10 DIAGNOSIS — Z7902 Long term (current) use of antithrombotics/antiplatelets: Secondary | ICD-10-CM

## 2014-03-10 DIAGNOSIS — F3289 Other specified depressive episodes: Secondary | ICD-10-CM | POA: Diagnosis present

## 2014-03-10 DIAGNOSIS — D638 Anemia in other chronic diseases classified elsewhere: Secondary | ICD-10-CM | POA: Diagnosis present

## 2014-03-10 DIAGNOSIS — F411 Generalized anxiety disorder: Secondary | ICD-10-CM

## 2014-03-10 DIAGNOSIS — E1110 Type 2 diabetes mellitus with ketoacidosis without coma: Secondary | ICD-10-CM | POA: Diagnosis present

## 2014-03-10 DIAGNOSIS — R0902 Hypoxemia: Secondary | ICD-10-CM | POA: Diagnosis present

## 2014-03-10 DIAGNOSIS — E1039 Type 1 diabetes mellitus with other diabetic ophthalmic complication: Secondary | ICD-10-CM | POA: Diagnosis present

## 2014-03-10 DIAGNOSIS — N183 Chronic kidney disease, stage 3 unspecified: Secondary | ICD-10-CM

## 2014-03-10 DIAGNOSIS — E039 Hypothyroidism, unspecified: Secondary | ICD-10-CM | POA: Diagnosis present

## 2014-03-10 DIAGNOSIS — E538 Deficiency of other specified B group vitamins: Secondary | ICD-10-CM | POA: Diagnosis present

## 2014-03-10 DIAGNOSIS — Z808 Family history of malignant neoplasm of other organs or systems: Secondary | ICD-10-CM

## 2014-03-10 DIAGNOSIS — E1029 Type 1 diabetes mellitus with other diabetic kidney complication: Secondary | ICD-10-CM | POA: Diagnosis present

## 2014-03-10 DIAGNOSIS — F909 Attention-deficit hyperactivity disorder, unspecified type: Secondary | ICD-10-CM

## 2014-03-10 DIAGNOSIS — Z803 Family history of malignant neoplasm of breast: Secondary | ICD-10-CM

## 2014-03-10 DIAGNOSIS — Z9119 Patient's noncompliance with other medical treatment and regimen: Secondary | ICD-10-CM

## 2014-03-10 DIAGNOSIS — I129 Hypertensive chronic kidney disease with stage 1 through stage 4 chronic kidney disease, or unspecified chronic kidney disease: Secondary | ICD-10-CM | POA: Diagnosis present

## 2014-03-10 DIAGNOSIS — Z794 Long term (current) use of insulin: Secondary | ICD-10-CM

## 2014-03-10 DIAGNOSIS — I214 Non-ST elevation (NSTEMI) myocardial infarction: Principal | ICD-10-CM | POA: Diagnosis present

## 2014-03-10 DIAGNOSIS — G4710 Hypersomnia, unspecified: Secondary | ICD-10-CM

## 2014-03-10 DIAGNOSIS — E1010 Type 1 diabetes mellitus with ketoacidosis without coma: Secondary | ICD-10-CM

## 2014-03-10 DIAGNOSIS — E1043 Type 1 diabetes mellitus with diabetic autonomic (poly)neuropathy: Secondary | ICD-10-CM

## 2014-03-10 DIAGNOSIS — Z7982 Long term (current) use of aspirin: Secondary | ICD-10-CM

## 2014-03-10 DIAGNOSIS — E1049 Type 1 diabetes mellitus with other diabetic neurological complication: Secondary | ICD-10-CM | POA: Diagnosis present

## 2014-03-10 DIAGNOSIS — E785 Hyperlipidemia, unspecified: Secondary | ICD-10-CM | POA: Diagnosis present

## 2014-03-10 DIAGNOSIS — Z888 Allergy status to other drugs, medicaments and biological substances status: Secondary | ICD-10-CM

## 2014-03-10 DIAGNOSIS — K589 Irritable bowel syndrome without diarrhea: Secondary | ICD-10-CM | POA: Diagnosis present

## 2014-03-10 DIAGNOSIS — R740 Nonspecific elevation of levels of transaminase and lactic acid dehydrogenase [LDH]: Secondary | ICD-10-CM

## 2014-03-10 DIAGNOSIS — F329 Major depressive disorder, single episode, unspecified: Secondary | ICD-10-CM | POA: Diagnosis present

## 2014-03-10 DIAGNOSIS — I951 Orthostatic hypotension: Secondary | ICD-10-CM

## 2014-03-10 DIAGNOSIS — R7989 Other specified abnormal findings of blood chemistry: Secondary | ICD-10-CM

## 2014-03-10 DIAGNOSIS — E1142 Type 2 diabetes mellitus with diabetic polyneuropathy: Secondary | ICD-10-CM | POA: Diagnosis present

## 2014-03-10 DIAGNOSIS — E871 Hypo-osmolality and hyponatremia: Secondary | ICD-10-CM | POA: Diagnosis present

## 2014-03-10 DIAGNOSIS — I6529 Occlusion and stenosis of unspecified carotid artery: Secondary | ICD-10-CM | POA: Diagnosis present

## 2014-03-10 DIAGNOSIS — Z87891 Personal history of nicotine dependence: Secondary | ICD-10-CM

## 2014-03-10 DIAGNOSIS — F528 Other sexual dysfunction not due to a substance or known physiological condition: Secondary | ICD-10-CM

## 2014-03-10 DIAGNOSIS — K219 Gastro-esophageal reflux disease without esophagitis: Secondary | ICD-10-CM | POA: Diagnosis present

## 2014-03-10 DIAGNOSIS — R351 Nocturia: Secondary | ICD-10-CM

## 2014-03-10 DIAGNOSIS — N179 Acute kidney failure, unspecified: Secondary | ICD-10-CM | POA: Diagnosis present

## 2014-03-10 DIAGNOSIS — M4802 Spinal stenosis, cervical region: Secondary | ICD-10-CM

## 2014-03-10 LAB — URINALYSIS, ROUTINE W REFLEX MICROSCOPIC
Glucose, UA: >1000 mg/dL — AB
HGB URINE DIPSTICK: NEGATIVE
Ketones, ur: 15 mg/dL — AB
Leukocytes, UA: NEGATIVE
Nitrite: NEGATIVE
Protein, ur: NEGATIVE mg/dL
SPECIFIC GRAVITY, URINE: 1.025 (ref 1.005–1.030)
UROBILINOGEN UA: 0.2 mg/dL (ref 0.0–1.0)
pH: 5 (ref 5.0–8.0)

## 2014-03-10 LAB — BASIC METABOLIC PANEL
ANION GAP: 17 — AB (ref 5–15)
BUN: 39 mg/dL — AB (ref 6–23)
CHLORIDE: 97 meq/L (ref 96–112)
CO2: 20 mEq/L (ref 19–32)
Calcium: 7.9 mg/dL — ABNORMAL LOW (ref 8.4–10.5)
Creatinine, Ser: 1.48 mg/dL — ABNORMAL HIGH (ref 0.50–1.35)
GFR calc non Af Amer: 46 mL/min — ABNORMAL LOW (ref >90)
GFR, EST AFRICAN AMERICAN: 53 mL/min — AB (ref >90)
Glucose, Bld: 358 mg/dL — ABNORMAL HIGH (ref 70–99)
Potassium: 5 mEq/L (ref 3.7–5.3)
Sodium: 134 mEq/L — ABNORMAL LOW (ref 137–147)

## 2014-03-10 LAB — TSH: TSH: 0.525 u[IU]/mL (ref 0.350–4.500)

## 2014-03-10 LAB — CBC
HCT: 31.8 % — ABNORMAL LOW (ref 39.0–52.0)
HCT: 30.9 % — ABNORMAL LOW (ref 39.0–52.0)
HEMOGLOBIN: 10.2 g/dL — AB (ref 13.0–17.0)
HEMOGLOBIN: 10.1 g/dL — AB (ref 13.0–17.0)
MCH: 32.8 pg (ref 26.0–34.0)
MCH: 32.8 pg (ref 26.0–34.0)
MCHC: 32.1 g/dL (ref 30.0–36.0)
MCHC: 32.7 g/dL (ref 30.0–36.0)
MCV: 102.3 fL — ABNORMAL HIGH (ref 78.0–100.0)
MCV: 100.3 fL — ABNORMAL HIGH (ref 78.0–100.0)
PLATELETS: 160 10*3/uL (ref 150–400)
Platelets: 167 10*3/uL (ref 150–400)
RBC: 3.11 MIL/uL — AB (ref 4.22–5.81)
RBC: 3.08 MIL/uL — AB (ref 4.22–5.81)
RDW: 13.5 % (ref 11.5–15.5)
RDW: 13.1 % (ref 11.5–15.5)
WBC: 9.3 10*3/uL (ref 4.0–10.5)
WBC: 8.3 10*3/uL (ref 4.0–10.5)

## 2014-03-10 LAB — I-STAT ARTERIAL BLOOD GAS, ED
ACID-BASE DEFICIT: 10 mmol/L — AB (ref 0.0–2.0)
BICARBONATE: 15.7 meq/L — AB (ref 20.0–24.0)
O2 Saturation: 95 %
TCO2: 17 mmol/L (ref 0–100)
pCO2 arterial: 33.6 mmHg — ABNORMAL LOW (ref 35.0–45.0)
pH, Arterial: 7.277 — ABNORMAL LOW (ref 7.350–7.450)
pO2, Arterial: 82 mmHg (ref 80.0–100.0)

## 2014-03-10 LAB — GLUCOSE, CAPILLARY
GLUCOSE-CAPILLARY: 311 mg/dL — AB (ref 70–99)
Glucose-Capillary: 154 mg/dL — ABNORMAL HIGH (ref 70–99)
Glucose-Capillary: 214 mg/dL — ABNORMAL HIGH (ref 70–99)
Glucose-Capillary: 382 mg/dL — ABNORMAL HIGH (ref 70–99)

## 2014-03-10 LAB — URINE MICROSCOPIC-ADD ON

## 2014-03-10 LAB — COMPREHENSIVE METABOLIC PANEL
ALK PHOS: 70 U/L (ref 39–117)
ALT: 36 U/L (ref 0–53)
AST: 54 U/L — ABNORMAL HIGH (ref 0–37)
Albumin: 3.3 g/dL — ABNORMAL LOW (ref 3.5–5.2)
Anion gap: 25 — ABNORMAL HIGH (ref 5–15)
BUN: 41 mg/dL — ABNORMAL HIGH (ref 6–23)
CO2: 16 mEq/L — ABNORMAL LOW (ref 19–32)
Calcium: 8.1 mg/dL — ABNORMAL LOW (ref 8.4–10.5)
Chloride: 89 mEq/L — ABNORMAL LOW (ref 96–112)
Creatinine, Ser: 1.73 mg/dL — ABNORMAL HIGH (ref 0.50–1.35)
GFR calc Af Amer: 44 mL/min — ABNORMAL LOW (ref >90)
GFR calc non Af Amer: 38 mL/min — ABNORMAL LOW (ref >90)
GLUCOSE: 482 mg/dL — AB (ref 70–99)
POTASSIUM: 4.9 meq/L (ref 3.7–5.3)
SODIUM: 130 meq/L — AB (ref 137–147)
Total Bilirubin: 0.5 mg/dL (ref 0.3–1.2)
Total Protein: 6 g/dL (ref 6.0–8.3)

## 2014-03-10 LAB — CBG MONITORING, ED
GLUCOSE-CAPILLARY: 368 mg/dL — AB (ref 70–99)
Glucose-Capillary: 485 mg/dL — ABNORMAL HIGH (ref 70–99)
Glucose-Capillary: 504 mg/dL — ABNORMAL HIGH (ref 70–99)
Glucose-Capillary: 419 mg/dL — ABNORMAL HIGH (ref 70–99)

## 2014-03-10 LAB — KETONES, QUALITATIVE

## 2014-03-10 LAB — TROPONIN I
TROPONIN I: 1.27 ng/mL — AB (ref <0.30)
TROPONIN I: 5.28 ng/mL — AB (ref <0.30)

## 2014-03-10 LAB — MRSA PCR SCREENING: MRSA by PCR: NEGATIVE

## 2014-03-10 MED ORDER — SODIUM CHLORIDE 0.9 % IV SOLN
INTRAVENOUS | Status: DC
  Filled 2014-03-10: qty 1

## 2014-03-10 MED ORDER — ALBUTEROL SULFATE (2.5 MG/3ML) 0.083% IN NEBU: 2.5000 mg | INHALATION_SOLUTION | Freq: Four times a day (QID) | RESPIRATORY_TRACT | Status: DC | PRN

## 2014-03-10 MED ORDER — METOPROLOL TARTRATE 12.5 MG HALF TABLET
12.5000 mg | ORAL_TABLET | Freq: Two times a day (BID) | ORAL | Status: DC
  Administered 2014-03-11: 12.5 mg via ORAL
  Filled 2014-03-10 (×2): qty 1

## 2014-03-10 MED ORDER — SODIUM CHLORIDE 0.9 % IV SOLN
INTRAVENOUS | Status: DC
  Administered 2014-03-10: 20:00:00 via INTRAVENOUS

## 2014-03-10 MED ORDER — DEXTROSE-NACL 5-0.45 % IV SOLN: INTRAVENOUS | Status: DC

## 2014-03-10 MED ORDER — DEXTROSE-NACL 5-0.45 % IV SOLN
INTRAVENOUS | Status: DC
  Administered 2014-03-10: 23:00:00 via INTRAVENOUS

## 2014-03-10 MED ORDER — LEVOTHYROXINE SODIUM 75 MCG PO TABS
75.0000 ug | ORAL_TABLET | Freq: Every day | ORAL | Status: DC
  Administered 2014-03-11 – 2014-03-13 (×3): 75 ug via ORAL
  Filled 2014-03-10 (×4): qty 1

## 2014-03-10 MED ORDER — SODIUM CHLORIDE 0.9 % IV SOLN: INTRAVENOUS | Status: AC

## 2014-03-10 MED ORDER — VITAMIN B-12 1000 MCG PO TABS
1000.0000 ug | ORAL_TABLET | Freq: Every day | ORAL | Status: DC
  Administered 2014-03-11 – 2014-03-13 (×3): 1000 ug via ORAL
  Filled 2014-03-10 (×3): qty 1

## 2014-03-10 MED ORDER — PANTOPRAZOLE SODIUM 40 MG PO TBEC
40.0000 mg | DELAYED_RELEASE_TABLET | Freq: Every day | ORAL | Status: DC
  Administered 2014-03-11 – 2014-03-13 (×3): 40 mg via ORAL
  Filled 2014-03-10 (×3): qty 1

## 2014-03-10 MED ORDER — ESCITALOPRAM OXALATE 20 MG PO TABS
20.0000 mg | ORAL_TABLET | Freq: Every day | ORAL | Status: DC
  Administered 2014-03-11 – 2014-03-13 (×3): 20 mg via ORAL
  Filled 2014-03-10 (×3): qty 1

## 2014-03-10 MED ORDER — SODIUM CHLORIDE 0.9 % IV BOLUS (SEPSIS)
1000.0000 mL | Freq: Once | INTRAVENOUS | Status: AC
  Administered 2014-03-10: 1000 mL via INTRAVENOUS

## 2014-03-10 MED ORDER — GABAPENTIN 600 MG PO TABS: 600.0000 mg | ORAL_TABLET | Freq: Every day | ORAL | Status: DC

## 2014-03-10 MED ORDER — ASPIRIN 81 MG PO TABS: 81.0000 mg | ORAL_TABLET | Freq: Every day | ORAL | Status: DC

## 2014-03-10 MED ORDER — METHYLPHENIDATE HCL 5 MG PO TABS
20.0000 mg | ORAL_TABLET | Freq: Two times a day (BID) | ORAL | Status: DC
  Administered 2014-03-11 – 2014-03-13 (×3): 20 mg via ORAL
  Filled 2014-03-10 (×4): qty 4

## 2014-03-10 MED ORDER — DEXTROSE 50 % IV SOLN
25.0000 mL | INTRAVENOUS | Status: DC | PRN
  Administered 2014-03-11: 15 mL via INTRAVENOUS
  Filled 2014-03-10: qty 50

## 2014-03-10 MED ORDER — HEPARIN (PORCINE) IN NACL 100-0.45 UNIT/ML-% IJ SOLN
800.0000 [IU]/h | INTRAMUSCULAR | Status: DC
  Administered 2014-03-10: 800 [IU]/h via INTRAVENOUS
  Filled 2014-03-10: qty 250

## 2014-03-10 MED ORDER — SODIUM CHLORIDE 0.9 % IV SOLN
INTRAVENOUS | Status: DC
  Administered 2014-03-10: 3.6 [IU]/h via INTRAVENOUS
  Filled 2014-03-10: qty 1

## 2014-03-10 MED ORDER — POTASSIUM CHLORIDE 10 MEQ/100ML IV SOLN
10.0000 meq | INTRAVENOUS | Status: AC
  Administered 2014-03-10 (×2): 10 meq via INTRAVENOUS
  Filled 2014-03-10 (×2): qty 100

## 2014-03-10 MED ORDER — ATORVASTATIN CALCIUM 40 MG PO TABS
40.0000 mg | ORAL_TABLET | Freq: Every day | ORAL | Status: DC
  Administered 2014-03-11 – 2014-03-12 (×2): 40 mg via ORAL
  Filled 2014-03-10 (×3): qty 1

## 2014-03-10 MED ORDER — PREGABALIN 50 MG PO CAPS
50.0000 mg | ORAL_CAPSULE | Freq: Three times a day (TID) | ORAL | Status: DC
  Administered 2014-03-10 – 2014-03-13 (×8): 50 mg via ORAL
  Filled 2014-03-10: qty 2
  Filled 2014-03-10: qty 1
  Filled 2014-03-10: qty 2
  Filled 2014-03-10 (×2): qty 1
  Filled 2014-03-10 (×2): qty 2
  Filled 2014-03-10: qty 1
  Filled 2014-03-10: qty 2

## 2014-03-10 MED ORDER — ASPIRIN EC 81 MG PO TBEC
81.0000 mg | DELAYED_RELEASE_TABLET | Freq: Every day | ORAL | Status: DC
  Administered 2014-03-10 – 2014-03-12 (×3): 81 mg via ORAL
  Filled 2014-03-10 (×3): qty 1

## 2014-03-10 MED ORDER — HEPARIN BOLUS VIA INFUSION
3000.0000 [IU] | Freq: Once | INTRAVENOUS | Status: AC
  Administered 2014-03-10: 3000 [IU] via INTRAVENOUS
  Filled 2014-03-10: qty 3000

## 2014-03-10 NOTE — ED Notes (Signed)
Resident at bedside.  

## 2014-03-10 NOTE — Consult Note (Addendum)
HPI:  73 y/o male with DM1 (poorly controlled) - c/b peripheral neuropathy, retinopathy and nephropathy, COPD (quit smoking 1006), IBS admitted with DKA. We are consulted for elevated troponin.   Patient ran out of lantus and has had hyperglycemia for several days with CBGs > 500. Presented to ER with fatigue found to have DKA. Got 2L NS. While in ER apparently dropped his sats into 80s but he denies CP or SOB.   CXR with COPD and chronic elevation of L hemidiaphragm. No HF. Troponin came back 1.27  ECG with NSR and ST scooping. Cr 1.73 (was 1.0 on 6/5 but 2.0 on 5/19).   Denies any previous cardiac h/o. Says he is not overly active but can go to store without CP or undue DOE. Does get periods of fatigue.    Review of Systems:     Cardiac Review of Systems: {Y] = yes [ ]  = no  Chest Pain [    ]  Resting SOB [   ] Exertional SOB  [  ]  Orthopnea [  ]   Pedal Edema [   ]    Palpitations [  ] Syncope  [  ]   Presyncope [   ]  General Review of Systems: [Y] = yes [  ]=no Constitional: recent weight change [  ]; anorexia [  ]; fatigue [place]  ]; nausea [  ]; night sweats [  ]; fever [  ]; or chills [  ];                                                                                                                                          Dental: poor dentition[  ];   Eye : blurred vision [  ]; diplopia [   ]; vision changes [  ];  Amaurosis fugax[  ]; Resp: cough [  ];  wheezing[  ];  hemoptysis[  ]; shortness of breath[ y ]; paroxysmal nocturnal dyspnea[  ]; dyspnea on exertion[  ]; or orthopnea[  ];  GI:  gallstones[  ], vomiting[  ];  dysphagia[  ]; melena[  ];  hematochezia [  ]; heartburn[  ];    GU: kidney stones [  ]; hematuria[  ];   dysuria [  ];  nocturia[  ];  history of     obstruction [  ];                 Skin: rash, swelling[  ];, hair loss[  ];  peripheral edema[  ];  or itching[  ]; Musculosketetal: myalgias[ y ];  joint swelling[  ];  joint erythema[  ];  joint pain[  y];   back pain[  ];  Heme/Lymph: bruising[  ];  bleeding[  ];  anemia[ y ];  Neuro: TIA[  ];  headaches[  ];  stroke[  ];  vertigo[  ];  seizures[  ];   paresthesias[  ];  difficulty walking[  ];  Psych:depression[  ]; anxiety[  ];  Endocrine: diabetes[y  ];  thyroid dysfunction[  y];  Immunizations: Flu [  ]; Pneumococcal[  ];  Other:  Past Medical History  Diagnosis Date  . Shoulder pain, left   . Malnutrition 11/28/2008  . Right upper lobe pneumonia 11/28/2008  . Anxiety 07/18/2008  . Irritable bowel syndrome 04/27/2008  . Nocturia 03/13/2008  . COPD (chronic obstructive pulmonary disease) 02/27/2008  . B12 deficiency 02/16/2008    in 5/09: B12 262, MMA 1040  . Chronic diarrhea 01/01/2008    s/p EGD 10/06: H pylori + gastritis, duodenal biopsy normal (Dr. Elnoria HowardHung); s/p colonoscopy 10/06: hemorrhoids (Dr. Elnoria HowardHung); evaluated by Dr. Yancey FlemingsJohn Perry  in 8/09: tissue transglutaminase AB negative, VIP normal, stool fat content normal, urine 5-HIAA normal; normal GES 11/09 (done for "fluctuating sugars")  . 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 Leg pain, right 09/29/2007  . Fatigue 02/28/2007  . 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 Hyperkalemia 02/09/2007  . Orthostatic hypotension 02/09/2007  . Hypertension 02/09/2007  . Spinal stenosis in cervical region 05/03    MRI  . Erectile dysfunction 09/27/2006    s/p penile implant  . Anemia, iron deficiency 09/27/2006    neg colonoscopy 2006 - Dr. Elnoria HowardHung; ferritin 152; hgb  15.7  on 07/07  . Hypothyroidism 09/27/2006    TSH 2.639  07/07  . Hypersomnia 09/27/2006    evaluated by Dr. Jetty Duhamellinton Young (5/08) and Dr. Vickey Hugerohmeier; PSG 8/09: chronic delayed sleep phase syndrome (patient sleeps during the day and is awake at night), nocturnal myoclonus (eval for RLS and IDA suggested). Pt advised to change sleeping behavior  . Diabetes mellitus type II 09/07/1984    poorly controlled, complicated by peripheral neuropathy,  microalbuminuria, mild non proliferative retinopathy, s/p DKA 7/05, on insulin pump x 4/09, started a pump vacation on 11/19/2008  . Hyperlipidemia   . 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 Hemorrhoids   . Chronic kidney disease   . Unspecified hereditary and idiopathic peripheral neuropathy   . Hyperpotassemia   . Other testicular hypofunction   . Impotence of organic origin   . Other chronic pain   . Other malaise and fatigue   . Unspecified hypothyroidism   . Type II or unspecified type diabetes mellitus with neurological manifestations, not stated as uncontrolled   . Other B-complex deficiencies   . Other organic hypersomnia   . Type I (juvenile type) diabetes mellitus without mention of complication, not stated as uncontrolled   . Depression   . Attention deficit disorder with hyperactivity(314.01)   . Hypertrophy of prostate without urinary obstruction and other lower urinary tract symptoms (LUTS)   . Lumbago   . ADHD, adult residual type 05/31/2013     (Not in a hospital admission)   Allergies  Allergen Reactions  . Ace Inhibitors Other (See Comments)    dizzy  . Angiotensin Receptor Blockers Other (See Comments)    unknown  . Bee Venom Swelling  . Cymbalta [Duloxetine Hcl] Other (See Comments)    dizzy    History   Social History  . Marital Status: Divorced    Spouse Name: [name]    Number of Children: 1  . Years of Education: 15   Occupational History  . [employer]ER [employer]    Social History Main Topics  . Smoking status: Former Smoker    Types: Cigarettes    Quit date: 09/28/2004  .  Smokeless tobacco: Never Used  . Alcohol Use: No     Comment: quit in 2005  . Drug Use: No  . Sexual Activity: Not on file   Other Topics Concern  . Not on file   Social History Narrative   Patient is right handed, quit caffeinated beverages in 2005.   Works as a Museum/gallery conservator. One son. He is divorced.    Family History  Problem Relation Age of Onset  . Bone  cancer Mother   . Breast cancer Mother   . Cancer Mother   . Lung cancer Father   . Cancer Father   . Breast cancer Sister   . Cancer Sister   . Lung cancer Brother   . Cancer Sister     type unknown    PHYSICAL EXAM: Filed Vitals:   03/10/14 1800  BP: 114/84  Pulse: 71  Temp:   Resp:    General:  Mildly disshelved. Fatigued appearing. Very pleasant. No respiratory difficulty HEENT: normal Neck: supple. JVP 7 . Carotids 2+ bilat; bilaeral bruit R>L No lymphadenopathy or thryomegaly appreciated. Cor: PMI nondisplaced. Regular rate & rhythm. No rubs, gallops or murmurs. Lungs: diminished BS throughout with long exp phase. Dull at L base Abdomen: soft, nontender, nondistended. No hepatosplenomegaly. No bruits or masses. Good bowel sounds. Extremities: no cyanosis, clubbing, rash, edema bilateral femoral bruits. DPs nonpalpable. +penile prosthesis Neuro: alert & oriented x 3, cranial nerves grossly intact. moves all 4 extremities w/o difficulty. Affect pleasant.  ECG: NSR 72 with mild ST sagging  Results for orders placed during the hospital encounter of 03/10/14 (from the past 24 hour(s))  CBG MONITORING, ED     Status: Abnormal   Collection Time    03/10/14  3:20 PM      Result Value Ref Range   Glucose-Capillary 485 (*) 70 - 99 mg/dL   Comment 1 Notify RN     Comment 2 Documented in Chart    CBC     Status: Abnormal   Collection Time    03/10/14  3:46 PM      Result Value Ref Range   WBC 9.3  4.0 - 10.5 K/uL   RBC 3.11 (*) 4.22 - 5.81 MIL/uL   Hemoglobin 10.2 (*) 13.0 - 17.0 g/dL   HCT 16.1 (*) 09.6 - 04.5 %   MCV 102.3 (*) 78.0 - 100.0 fL   MCH 32.8  26.0 - 34.0 pg   MCHC 32.1  30.0 - 36.0 g/dL   RDW 40.9  81.1 - 91.4 %   Platelets 167  150 - 400 K/uL  COMPREHENSIVE METABOLIC PANEL     Status: Abnormal   Collection Time    03/10/14  3:46 PM      Result Value Ref Range   Sodium 130 (*) 137 - 147 mEq/L   Potassium 4.9  3.7 - 5.3 mEq/L   Chloride 89 (*) 96 - 112  mEq/L   CO2 16 (*) 19 - 32 mEq/L   Glucose, Bld 482 (*) 70 - 99 mg/dL   BUN 41 (*) 6 - 23 mg/dL   Creatinine, Ser 7.82 (*) 0.50 - 1.35 mg/dL   Calcium 8.1 (*) 8.4 - 10.5 mg/dL   Total Protein 6.0  6.0 - 8.3 g/dL   Albumin 3.3 (*) 3.5 - 5.2 g/dL   AST 54 (*) 0 - 37 U/L   ALT 36  0 - 53 U/L   Alkaline Phosphatase 70  39 - 117 U/L  Total Bilirubin 0.5  0.3 - 1.2 mg/dL   GFR calc non Af Amer 38 (*) >90 mL/min   GFR calc Af Amer 44 (*) >90 mL/min   Anion gap 25 (*) 5 - 15  KETONES, QUALITATIVE     Status: Abnormal   Collection Time    03/10/14  3:46 PM      Result Value Ref Range   Acetone, Bld SMALL (*) NEGATIVE  TROPONIN I     Status: Abnormal   Collection Time    03/10/14  3:46 PM      Result Value Ref Range   Troponin I 1.27 (*) <0.30 ng/mL  I-STAT ARTERIAL BLOOD GAS, ED     Status: Abnormal   Collection Time    03/10/14  3:54 PM      Result Value Ref Range   pH, Arterial 7.277 (*) 7.350 - 7.450   pCO2 arterial 33.6 (*) 35.0 - 45.0 mmHg   pO2, Arterial 82.0  80.0 - 100.0 mmHg   Bicarbonate 15.7 (*) 20.0 - 24.0 mEq/L   TCO2 17  0 - 100 mmol/L   O2 Saturation 95.0     Acid-base deficit 10.0 (*) 0.0 - 2.0 mmol/L   Patient temperature 98.6 F     Collection site RADIAL, ALLEN'S TEST ACCEPTABLE     Drawn by Operator     Sample type ARTERIAL    CBG MONITORING, ED     Status: Abnormal   Collection Time    03/10/14  5:08 PM      Result Value Ref Range   Glucose-Capillary 504 (*) 70 - 99 mg/dL   Comment 1 Documented in Chart    URINALYSIS, ROUTINE W REFLEX MICROSCOPIC     Status: Abnormal   Collection Time    03/10/14  5:11 PM      Result Value Ref Range   Color, Urine YELLOW  YELLOW   APPearance CLEAR  CLEAR   Specific Gravity, Urine 1.025  1.005 - 1.030   pH 5.0  5.0 - 8.0   Glucose, UA >1000 (*) NEGATIVE mg/dL   Hgb urine dipstick NEGATIVE  NEGATIVE   Bilirubin Urine SMALL (*) NEGATIVE   Ketones, ur 15 (*) NEGATIVE mg/dL   Protein, ur NEGATIVE  NEGATIVE mg/dL    Urobilinogen, UA 0.2  0.0 - 1.0 mg/dL   Nitrite NEGATIVE  NEGATIVE   Leukocytes, UA NEGATIVE  NEGATIVE  URINE MICROSCOPIC-ADD ON     Status: Abnormal   Collection Time    03/10/14  5:11 PM      Result Value Ref Range   Casts HYALINE CASTS (*) NEGATIVE  CBG MONITORING, ED     Status: Abnormal   Collection Time    03/10/14  5:54 PM      Result Value Ref Range   Glucose-Capillary 419 (*) 70 - 99 mg/dL   Comment 1 Notify RN     Dg Chest 2 View  03/10/2014   CLINICAL DATA:  Hyperglycemia. Dizziness. Shortness of breath. Current history of COPD, hypertension and diabetes.  EXAM: CHEST  2 VIEW  COMPARISON:  Portable chest x-ray 08/06/2010. Two-view chest x-ray 11/28/2008. CT chest 08/06/2010.  FINDINGS: Cardiac silhouette mildly to moderately enlarged but stable. Thoracic aorta atherosclerotic, unchanged. Prominent central pulmonary arteries, unchanged. Chronic elevation of the left hemidiaphragm and chronic scar/atelectasis in the left lower lobe, unchanged. Emphysematous changes in the upper lobes, unchanged. Lungs otherwise clear. No localized airspace consolidation. No pleural effusions. No pneumothorax. Normal pulmonary vascularity. Degenerative changes  involving the thoracic spine.  IMPRESSION: COPD/emphysema. Stable cardiomegaly. Stable chronic elevation of the left hemidiaphragm and chronic scar/atelectasis in the left lower lobe. No acute cardiopulmonary disease.   Electronically Signed   By: Hulan Saas M.D.   On: 03/10/2014 16:46    ASSESSMENT: 1. NSTEMI 2. COPD with transient hypoxia in ER 3. DKA 4. Type 1 DM, poorly controlled 5. CKD, stage 3 6. Hyponatremia 7. Diffuse PAD on exam     --bilateral carotid and femoral bruits with nonpalpable distal pulses 9. Anemia of chronic disease  PLAN/DISCUSSION:  He has had a NSTEMI in setting of DKA and a long h/o type I DM with multiple complications. On exam he has diffuse vascular disease and I am concerned he has very severe  underlying CAD. Fortunately he is currently not symptomatic from cardiac standpoint.  Agree with admitting to SDU. Treat with heparin, ASA, statin and b-blocker as tolerated. Has been unable to tolerate ACE/ARBs in past. Check echo. Carotid dopplers, ab u/s for AAA and ABIs. Will need cath once stabilized.  Would be careful with volume loading and give lasix 20mg  IV as needed.   We will follow.  Truman Hayward 7:18 PM

## 2014-03-10 NOTE — Progress Notes (Signed)
ANTICOAGULATION CONSULT NOTE - Initial Consult  Pharmacy Consult for Heparin Indication: chest pain/ACS/STEMI  Allergies  Allergen Reactions  . Ace Inhibitors Other (See Comments)    dizzy  . Angiotensin Receptor Blockers Other (See Comments)    unknown  . Bee Venom Swelling  . Cymbalta [Duloxetine Hcl] Other (See Comments)    dizzy    Patient Measurements: Height: 5\' 7"  (170.2 cm) Weight: 125 lb (56.7 kg) IBW/kg (Calculated) : 66.1 Heparin Dosing Weight:   Vital Signs: Temp: 97.5 F (36.4 C) (07/04 1951) Temp src: Oral (07/04 1951) BP: 113/45 mmHg (07/04 1950) Pulse Rate: 69 (07/04 1950)  Labs:  Recent Labs  03/10/14 1546  HGB 10.2*  HCT 31.8*  PLT 167  CREATININE 1.73*  TROPONINI 1.27*    Estimated Creatinine Clearance: 31.4 ml/min (by C-G formula based on Cr of 1.73).   Medical History: Past Medical History  Diagnosis Date  . Shoulder pain, left   . Malnutrition 11/28/2008  . Right upper lobe pneumonia 11/28/2008  . Anxiety 07/18/2008  . Irritable bowel syndrome 04/27/2008  . Nocturia 03/13/2008  . COPD (chronic obstructive pulmonary disease) 02/27/2008  . B12 deficiency 02/16/2008    in 5/09: B12 262, MMA 1040  . Chronic diarrhea 01/01/2008    s/p EGD 10/06: H pylori + gastritis, duodenal biopsy normal (Dr. Elnoria HowardHung); s/p colonoscopy 10/06: hemorrhoids (Dr. Elnoria HowardHung); evaluated by Dr. Yancey FlemingsJohn Perry  in 8/09: tissue transglutaminase AB negative, VIP normal, stool fat content normal, urine 5-HIAA normal; normal GES 11/09 (done for "fluctuating sugars")  . 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 Leg pain, right 09/29/2007  . Fatigue 02/28/2007  . 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 Hyperkalemia 02/09/2007  . Orthostatic hypotension 02/09/2007  . Hypertension 02/09/2007  . Spinal stenosis in cervical region 05/03    MRI  . Erectile dysfunction 09/27/2006    s/p penile implant  . Anemia, iron deficiency 09/27/2006    neg  colonoscopy 2006 - Dr. Elnoria HowardHung; ferritin 152; hgb  15.7  on 07/07  . Hypothyroidism 09/27/2006    TSH 2.639  07/07  . Hypersomnia 09/27/2006    evaluated by Dr. Jetty Duhamellinton Young (5/08) and Dr. Vickey Hugerohmeier; PSG 8/09: chronic delayed sleep phase syndrome (patient sleeps during the day and is awake at night), nocturnal myoclonus (eval for RLS and IDA suggested). Pt advised to change sleeping behavior  . Diabetes mellitus type II 09/07/1984    poorly controlled, complicated by peripheral neuropathy, microalbuminuria, mild non proliferative retinopathy, s/p DKA 7/05, on insulin pump x 4/09, started a pump vacation on 11/19/2008  . Hyperlipidemia   . 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 Hemorrhoids   . Chronic kidney disease   . Unspecified hereditary and idiopathic peripheral neuropathy   . Hyperpotassemia   . Other testicular hypofunction   . Impotence of organic origin   . Other chronic pain   . Other malaise and fatigue   . Unspecified hypothyroidism   . Type II or unspecified type diabetes mellitus with neurological manifestations, not stated as uncontrolled   . Other B-complex deficiencies   . Other organic hypersomnia   . Type I (juvenile type) diabetes mellitus without mention of complication, not stated as uncontrolled   . Depression   . Attention deficit disorder with hyperactivity(314.01)   . Hypertrophy of prostate without urinary obstruction and other lower urinary tract symptoms (LUTS)   . Lumbago   . ADHD, adult residual type 05/31/2013    Medications:  Scheduled:  . aspirin EC  81 mg  Oral Daily  . [START ON 03/11/2014] atorvastatin  40 mg Oral q1800  . [START ON 03/11/2014] escitalopram  20 mg Oral Daily  . heparin  3,000 Units Intravenous Once  . [START ON 03/11/2014] levothyroxine  75 mcg Oral QAC breakfast  . methylphenidate  20 mg Oral BID  . [START ON 03/11/2014] metoprolol tartrate  12.5 mg Oral BID  . [START ON 03/11/2014] pantoprazole  40 mg Oral Q1200  . potassium chloride  10 mEq  Intravenous Q1H  . pregabalin  50 mg Oral TID  . [START ON 03/11/2014] vitamin B-12  1,000 mcg Oral Daily    Assessment: 73 yr old male type 1 DM went out of town and forgot his insulin. So he hadn't had it for a couple of days. His PMH includes HTN, GERD, COPD, IBS.   His CBG was >1000 on arrival. Goal of Therapy:  Heparin level 0.3-0.7 units/ml Monitor platelets by anticoagulation protocol: Yes   Plan:  Heparin bolus 3000 units and heparin drip at 800 units/hr. Daily heparin level and CBC.  Eugene Garnet 03/10/2014,9:09 PM

## 2014-03-10 NOTE — ED Notes (Signed)
Daughter in law reports that pt was visiting about 1 hour away. Yesterday pt realized that he was out of his Lantus. Pt meter has been reading "HI" since 1700 last night. Family also reports that pt has not eaten since Thursday.

## 2014-03-10 NOTE — ED Provider Notes (Signed)
CSN: [id number]     Arrival date & time 03/10/14  1515 History   First MD Initiated Contact with Patient 03/10/14 1528     Chief Complaint  Patient presents with  . Hyperglycemia   73 y/o male with type 1 DM that presents with hyperglycemia and mild confusion. Per patient's daughter in the law, the family went out of town and the patient noted that he did not have enough of this insulin so he went home early 3 days prior. They had talked to him since then and he did not seem confused but they returned home the patient was not able to tell them if he had taken his insulin and stated that he had not eaten for the 3 days. Patient is alert and oriented X 3 but is unclear about medication dosing and specifics of his PMH. Patient states that he feels week but otherwise has no symptoms. Patient is not on home O2 but was a smoker (quit 10 years prior) Patient denies nausea/vomiting, chest pain, SOB, abdomen pain.     (Consider location/radiation/quality/duration/timing/severity/associated sxs/prior Treatment) Patient is a 73 y.o. male presenting with hyperglycemia.  Hyperglycemia Blood sugar level PTA:  480 Severity:  Moderate Onset quality:  Sudden Duration:  2 days Timing:  Constant Associated symptoms: confusion   Associated symptoms: no abdominal pain, no chest pain, no dysuria and no shortness of breath     Past Medical History  Diagnosis Date  . Shoulder pain, left   . Malnutrition 11/28/2008  . Right upper lobe pneumonia 11/28/2008  . Anxiety 07/18/2008  . Irritable bowel syndrome 04/27/2008  . Nocturia 03/13/2008  . COPD (chronic obstructive pulmonary disease) 02/27/2008  . B12 deficiency 02/16/2008    in 5/09: B12 262, MMA 1040  . Chronic diarrhea 01/01/2008    s/p EGD 10/06: H pylori + gastritis, duodenal biopsy normal (Dr. Elnoria Howard); s/p colonoscopy 10/06: hemorrhoids (Dr. Elnoria Howard); evaluated by Dr. Yancey Flemings  in 8/09: tissue transglutaminase AB negative, VIP normal, stool fat content  normal, urine 5-HIAA normal; normal GES 11/09 (done for "fluctuating sugars")  . 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 Leg pain, right 09/29/2007  . Fatigue 02/28/2007  . 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 Hyperkalemia 02/09/2007  . Orthostatic hypotension 02/09/2007  . Hypertension 02/09/2007  . Spinal stenosis in cervical region 05/03    MRI  . Erectile dysfunction 09/27/2006    s/p penile implant  . Anemia, iron deficiency 09/27/2006    neg colonoscopy 2006 - Dr. Elnoria Howard; ferritin 152; hgb  15.7  on 07/07  . Hypothyroidism 09/27/2006    TSH 2.639  07/07  . Hypersomnia 09/27/2006    evaluated by Dr. Jetty Duhamel (5/08) and Dr. Vickey Huger; PSG 8/09: chronic delayed sleep phase syndrome (patient sleeps during the day and is awake at night), nocturnal myoclonus (eval for RLS and IDA suggested). Pt advised to change sleeping behavior  . Diabetes mellitus type II 09/07/1984    poorly controlled, complicated by peripheral neuropathy, microalbuminuria, mild non proliferative retinopathy, s/p DKA 7/05, on insulin pump x 4/09, started a pump vacation on 11/19/2008  . Hyperlipidemia   . 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 Hemorrhoids   . Chronic kidney disease   . Unspecified hereditary and idiopathic peripheral neuropathy   . Hyperpotassemia   . Other testicular hypofunction   . Impotence of organic origin   . Other chronic pain   . Other malaise and fatigue   . Unspecified hypothyroidism   . Type II or unspecified  type diabetes mellitus with neurological manifestations, not stated as uncontrolled   . Other B-complex deficiencies   . Other organic hypersomnia   . Type I (juvenile type) diabetes mellitus without mention of complication, not stated as uncontrolled   . Depression   . Attention deficit disorder with hyperactivity(314.01)   . Hypertrophy of prostate without urinary obstruction and other lower urinary tract symptoms (LUTS)   . Lumbago   . ADHD,  adult residual type 05/31/2013   Past Surgical History  Procedure Laterality Date  . Penile prosthesis implant    . Tonsillectomy    . Colonoscopy  06/25/2005    Dr. Jeani Hawking   Family History  Problem Relation Age of Onset  . Bone cancer Mother   . Breast cancer Mother   . Cancer Mother   . Lung cancer Father   . Cancer Father   . Breast cancer Sister   . Cancer Sister   . Lung cancer Brother   . Cancer Sister     type unknown   History  Substance Use Topics  . Smoking status: Former Smoker    Types: Cigarettes    Quit date: 09/28/2004  . Smokeless tobacco: Never Used  . Alcohol Use: No     Comment: quit in 2005    Review of Systems  Constitutional: Negative for activity change.  HENT: Negative for congestion.   Eyes: Negative for visual disturbance.  Respiratory: Negative for cough and shortness of breath.   Cardiovascular: Negative for chest pain and leg swelling.  Gastrointestinal: Negative for abdominal pain and blood in stool.  Genitourinary: Negative for dysuria and hematuria.  Musculoskeletal: Negative for back pain.  Skin: Negative for color change.  Neurological: Positive for weakness. Negative for syncope and headaches.  Psychiatric/Behavioral: Positive for confusion. Negative for agitation.      Allergies  Ace inhibitors; Angiotensin receptor blockers; Bee venom; and Cymbalta  Home Medications   Prior to Admission medications   Medication Sig Start Date End Date Taking? Authorizing Provider  aspirin 81 MG tablet Take 81 mg by mouth daily.     Historical Provider, MD  diphenoxylate-atropine (LOMOTIL) 2.5-0.025 MG per tablet Take 1 tablet by mouth 3 (three) times daily as needed for diarrhea or loose stools. 10/19/13   [name] L [name], DO  escitalopram (LEXAPRO) 20 MG tablet Take 1 tablet (20 mg total) by mouth daily. 10/26/13   [name] L [name], DO  HYDROcodone-acetaminophen (NORCO/VICODIN) 5-325 MG per tablet Take one tablet by mouth five times daily  as needed to control pain 03/08/14   Claudie Revering, NP  Insulin Glargine (LANTUS) 100 UNIT/ML Solostar Pen Take 7 units in the morning and 3 units at bedtime 09/21/13   Reather Littler, MD  Insulin Isophane Human (HUMULIN N) 100 UNIT/ML Kiwkpen Inject 3 Units into the skin every evening.  10/18/13   Reather Littler, MD  insulin lispro (HUMALOG) 100 UNIT/ML KiwkPen Inject 8-10 Units into the skin daily as needed (after meals).  03/16/13   Reather Littler, MD  levothyroxine (SYNTHROID, LEVOTHROID) 75 MCG tablet Take one tablet by mouth once daily for thyroid 12/04/13   [name] L [name], DO  methylphenidate (RITALIN) 20 MG tablet Take 1 tablet (20 mg total) by mouth 2 (two) times daily. 01/23/14   Porfirio Mylar Dohmeier, MD  metoprolol tartrate (LOPRESSOR) 25 MG tablet Take 25 mg by mouth 2 (two) times daily.    Historical Provider, MD  ONE TOUCH ULTRA TEST test strip  01/21/14   Historical Provider,  MD  pregabalin (LYRICA) 50 MG capsule Take 1 capsule (50 mg total) by mouth 3 (three) times daily. 02/26/14   [name] L [name], DO  testosterone cypionate (DEPOTESTOTERONE CYPIONATE) 200 MG/ML injection Every 2 weeks, administered by Urologist 04/26/13   Historical Provider, MD  vitamin B-12 (CYANOCOBALAMIN) 1000 MCG tablet Take 1,000 mcg by mouth daily.    Historical Provider, MD   BP 78/42  Pulse 70  Temp(Src) 98 F (36.7 C) (Oral)  Ht 5\' 7"  (1.702 m)  Wt 130 lb (58.968 kg)  BMI 20.36 kg/m2  SpO2 95% Physical Exam  Nursing note and vitals reviewed. Constitutional: He is oriented to person, place, and time. He appears well-developed and well-nourished.  HENT:  Head: Normocephalic.  Eyes: Pupils are equal, round, and reactive to light.  Neck: Neck supple.  Cardiovascular: Normal rate and regular rhythm.  Exam reveals no gallop and no friction rub.   No murmur heard. Pulmonary/Chest: Effort normal. No respiratory distress.  Abdominal: Soft. He exhibits no distension. There is no tenderness.  Musculoskeletal: He exhibits no  edema.  Neurological: He is alert and oriented to person, place, and time.  Patient is confused about medical history on exam   Skin: Skin is warm.  Psychiatric: He has a normal mood and affect.    ED Course  Procedures (including critical care time) Labs Review Labs Reviewed  CBG MONITORING, ED - Abnormal; Notable for the following:    Glucose-Capillary 485 (*)    All other components within normal limits  CBC  COMPREHENSIVE METABOLIC PANEL  URINALYSIS, ROUTINE W REFLEX MICROSCOPIC  CBG MONITORING, ED    Imaging Review No results found.   EKG Interpretation None      MDM   Final diagnoses:  ADHD, adult residual type  Chronic airway obstruction, not elsewhere classified  Chronic renal failure, stage 3 (moderate)  Type 1 diabetes mellitus with ketoacidosis and without coma  Unspecified hypothyroidism  Non-ST elevation MI (NSTEMI)  Non-STEMI (non-ST elevated myocardial infarction)   73 y/o male with DM type 1 presents with confusion, hyperglycemia, new O2 requirement and hypotention. Patient denies chest pain or SOB, on physical exam he has RRR with no murmurs and lung sounds are clear.   Patient placed on [place] O2 when his saturations were 88%, patient continued to deny chest pain but further was noted on EKG to have depressions in anterior leads and was hypotensive with BPs 94/44. Patient was initially given 2L NS and did have improvement with to 111/51. Patient found to have an elevated troponin. CXR showed no acute abnormalities.   Patient found on ABG to have a pH of 7.27 and found to have an anion gap of 25. K of 4.9 at this time the patient was started on an insulin drip and his blood sugars were monitored Q 1 hour.   Dr. Gala RomneyBensimhon with Cardiology was consulted on the patient and deferred consult until the admitted team was able to assess the patient. The details of the patients condition was reiterated and consult was still deferred.  The patient was then admitted to  the step down unit by Dr. Gwenlyn PerkingMadera with the hospitalist service.     Clement SayresStaci Tora Prunty, MD 03/11/14 1248

## 2014-03-10 NOTE — ED Notes (Signed)
Patient SpO2 89%/RA instructed to take good deep breaths pt SpO2 increased to 90%/RA patient placed on 2L [place] SpO2 95%/2L Dr. Romeo Apple at bedside.

## 2014-03-10 NOTE — H&P (Signed)
[facility] History and Physical  AMER ALCINDOR ZOX:[id number] DOB: Dec 02, 1940 DOA: 03/10/2014  Referring physician: Dr. Micheline Maze  PCP: Bufford Spikes, DO   Chief Complaint: hyperglycemia and not feeling good.  HPI: Benjamin Hodge is a 73 y.o. male with a past medical history significant for hypertension, anxiety/depression, GERD, COPD, IBS, type 1 diabetes (last A1c 7.6); chronic kidney disease stage II-III; came to the hospital secondary to hyperglycemia and not feeling well. The patient reports that he went away to [facility]) for July we can and forgot to take any of his insulin. At the time that he realized that he had forgot his medication and drove back his glucometer was just reading high and he was feeling bad. In the emergency department patient was found to be mildly tachypneic and initially with oxygen saturation of 89% on room air. He denies being short of breath at that moment. CBGs mother 1000 on arrival; patient also follow the acute on chronic renal failure with a creatinine of 1.7; elevated troponin 1.27 and some new changes in the T T waves and ST depressions on his EKG. [facility] has been consulted to admit the patient for further evaluation and treatment. Of note patient denies any chest pain, fever, chills, nausea, vomiting, melena, hematemesis, hematochezia, abdominal pain, dysuria or any other acute complaints.    Review of Systems:  Negative except as otherwise mentioned on history of present illness.  Past Medical History  Diagnosis Date  . Shoulder pain, left   . Malnutrition 11/28/2008  . Right upper lobe pneumonia 11/28/2008  . Anxiety 07/18/2008  . Irritable bowel syndrome 04/27/2008  . Nocturia 03/13/2008  . COPD (chronic obstructive pulmonary disease) 02/27/2008  . B12 deficiency 02/16/2008    in 5/09: B12 262, MMA 1040  . Chronic diarrhea 01/01/2008    s/p EGD 10/06: H pylori + gastritis, duodenal biopsy normal (Dr. Elnoria Howard); s/p colonoscopy  10/06: hemorrhoids (Dr. Elnoria Howard); evaluated by Dr. Yancey Flemings  in 8/09: tissue transglutaminase AB negative, VIP normal, stool fat content normal, urine 5-HIAA normal; normal GES 11/09 (done for "fluctuating sugars")  . 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 Leg pain, right 09/29/2007  . Fatigue 02/28/2007  . 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 Hyperkalemia 02/09/2007  . Orthostatic hypotension 02/09/2007  . Hypertension 02/09/2007  . Spinal stenosis in cervical region 05/03    MRI  . Erectile dysfunction 09/27/2006    s/p penile implant  . Anemia, iron deficiency 09/27/2006    neg colonoscopy 2006 - Dr. Elnoria Howard; ferritin 152; hgb  15.7  on 07/07  . Hypothyroidism 09/27/2006    TSH 2.639  07/07  . Hypersomnia 09/27/2006    evaluated by Dr. Jetty Duhamel (5/08) and Dr. Vickey Huger; PSG 8/09: chronic delayed sleep phase syndrome (patient sleeps during the day and is awake at night), nocturnal myoclonus (eval for RLS and IDA suggested). Pt advised to change sleeping behavior  . Diabetes mellitus type II 09/07/1984    poorly controlled, complicated by peripheral neuropathy, microalbuminuria, mild non proliferative retinopathy, s/p DKA 7/05, on insulin pump x 4/09, started a pump vacation on 11/19/2008  . Hyperlipidemia   . 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 Hemorrhoids   . Chronic kidney disease   . Unspecified hereditary and idiopathic peripheral neuropathy   . Hyperpotassemia   . Other testicular hypofunction   . Impotence of organic origin   . Other chronic pain   . Other malaise and fatigue   . Unspecified hypothyroidism   . Type  II or unspecified type diabetes mellitus with neurological manifestations, not stated as uncontrolled   . Other B-complex deficiencies   . Other organic hypersomnia   . Type I (juvenile type) diabetes mellitus without mention of complication, not stated as uncontrolled   . Depression   . Attention deficit disorder with  hyperactivity(314.01)   . Hypertrophy of prostate without urinary obstruction and other lower urinary tract symptoms (LUTS)   . Lumbago   . ADHD, adult residual type 05/31/2013   Past Surgical History  Procedure Laterality Date  . Penile prosthesis implant    . Tonsillectomy    . Colonoscopy  06/25/2005    Dr. Jeani Hawking   Social History:  reports that he quit smoking about 9 years ago. His smoking use included Cigarettes. He smoked 0.00 packs per day. He has never used smokeless tobacco. He reports that he does not drink alcohol or use illicit drugs.  Allergies  Allergen Reactions  . Ace Inhibitors Other (See Comments)    dizzy  . Angiotensin Receptor Blockers Other (See Comments)    unknown  . Bee Venom Swelling  . Cymbalta [Duloxetine Hcl] Other (See Comments)    dizzy    Family History  Problem Relation Age of Onset  . Bone cancer Mother   . Breast cancer Mother   . Cancer Mother   . Lung cancer Father   . Cancer Father   . Breast cancer Sister   . Cancer Sister   . Lung cancer Brother   . Cancer Sister     type unknown     Prior to Admission medications   Medication Sig Start Date End Date Taking? Authorizing Provider  aspirin 81 MG tablet Take 81 mg by mouth daily.    Yes Historical Provider, MD  diphenoxylate-atropine (LOMOTIL) 2.5-0.025 MG per tablet Take 2 tablets by mouth 3 (three) times daily as needed for diarrhea or loose stools.   Yes Historical Provider, MD  escitalopram (LEXAPRO) 20 MG tablet Take 1 tablet (20 mg total) by mouth daily. 10/26/13  Yes [name] L [name], DO  gabapentin (NEURONTIN) 600 MG tablet Take 600 mg by mouth at bedtime. 01/02/14  Yes Historical Provider, MD  glucose blood test strip 1 each by Other route See admin instructions. Check blood sugar 3-4 times daily.   Yes Historical Provider, MD  HYDROcodone-acetaminophen (NORCO/VICODIN) 5-325 MG per tablet Take 1 tablet by mouth 5 (five) times daily as needed for moderate pain.   Yes  Historical Provider, MD  Insulin Glargine (LANTUS) 100 UNIT/ML Solostar Pen Take 6 units in the morning 09/21/13  Yes Reather Littler, MD  Insulin Isophane Human (HUMULIN N) 100 UNIT/ML Kiwkpen Inject 3 Units into the skin every evening.  10/18/13  Yes Reather Littler, MD  insulin lispro (HUMALOG) 100 UNIT/ML KiwkPen Inject 1-6 Units into the skin daily as needed (after meals).  03/16/13  Yes Reather Littler, MD  levothyroxine (SYNTHROID, LEVOTHROID) 75 MCG tablet Take 75 mcg by mouth daily before breakfast.   Yes Historical Provider, MD  methylphenidate (RITALIN) 20 MG tablet Take 1 tablet (20 mg total) by mouth 2 (two) times daily. 01/23/14  Yes Porfirio Mylar Dohmeier, MD  metoprolol tartrate (LOPRESSOR) 25 MG tablet Take 25 mg by mouth 2 (two) times daily.   Yes Historical Provider, MD  pregabalin (LYRICA) 50 MG capsule Take 50-100 mg by mouth at bedtime.   Yes Historical Provider, MD  testosterone cypionate (DEPOTESTOTERONE CYPIONATE) 200 MG/ML injection Inject 200 mg into the muscle every 14 (  fourteen) days.  04/26/13  Yes Historical Provider, MD  vitamin B-12 (CYANOCOBALAMIN) 1000 MCG tablet Take 1,000 mcg by mouth daily.   Yes Historical Provider, MD   Physical Exam: Filed Vitals:   03/10/14 1800  BP: 114/84  Pulse: 71  Temp:   Resp:     BP 114/84  Pulse 71  Temp(Src) 98 F (36.7 C) (Oral)  Resp 20  Ht 5\' 7"  (1.702 m)  Wt 58.968 kg (130 lb)  BMI 20.36 kg/m2  SpO2 97%  General:  Appears calm and comfortable; able to speak in full sentences. My evaluation oxygen saturation 93-94% on room air. Eyes: PERRL, normal lids, irises & conjunctiva; no icterus, no nystagmus ENT: grossly normal hearing, mild dryness of mucous membranes; no erythema or exudate inside his mouth. No drainage eyes ears or nostrils Neck: no LAD, masses or thyromegaly; no JVD appreciated. Cardiovascular: RRR, no m/r/g. No LE edema. Respiratory: CTA bilaterally, no work of breathing; just mild tachypnea (most likely secondary to ongoing  respiratory compensation to his DKA). Abdomen: soft, nt, nd; positive bowel sounds Skin: no rash or induration seen on limited exam Musculoskeletal: grossly normal tone BUE/BLE Psychiatric: grossly normal mood and affect, speech fluent and appropriate Neurologic: grossly non-focal.          Labs on Admission:  Basic Metabolic Panel:  Recent Labs Lab 03/10/14 1546  NA 130*  K 4.9  CL 89*  CO2 16*  GLUCOSE 482*  BUN 41*  CREATININE 1.73*  CALCIUM 8.1*   Liver Function Tests:  Recent Labs Lab 03/10/14 1546  AST 54*  ALT 36  ALKPHOS 70  BILITOT 0.5  PROT 6.0  ALBUMIN 3.3*   CBC:  Recent Labs Lab 03/10/14 1546  WBC 9.3  HGB 10.2*  HCT 31.8*  MCV 102.3*  PLT 167   Cardiac Enzymes:  Recent Labs Lab 03/10/14 1546  TROPONINI 1.27*    CBG:  Recent Labs Lab 03/10/14 1520 03/10/14 1708 03/10/14 1754  GLUCAP 485* 504* 419*    Radiological Exams on Admission: Dg Chest 2 View  03/10/2014   CLINICAL DATA:  Hyperglycemia. Dizziness. Shortness of breath. Current history of COPD, hypertension and diabetes.  EXAM: CHEST  2 VIEW  COMPARISON:  Portable chest x-ray 08/06/2010. Two-view chest x-ray 11/28/2008. CT chest 08/06/2010.  FINDINGS: Cardiac silhouette mildly to moderately enlarged but stable. Thoracic aorta atherosclerotic, unchanged. Prominent central pulmonary arteries, unchanged. Chronic elevation of the left hemidiaphragm and chronic scar/atelectasis in the left lower lobe, unchanged. Emphysematous changes in the upper lobes, unchanged. Lungs otherwise clear. No localized airspace consolidation. No pleural effusions. No pneumothorax. Normal pulmonary vascularity. Degenerative changes involving the thoracic spine.  IMPRESSION: COPD/emphysema. Stable cardiomegaly. Stable chronic elevation of the left hemidiaphragm and chronic scar/atelectasis in the left lower lobe. No acute cardiopulmonary disease.   Electronically Signed   By: Hulan Saashomas  Lawrence M.D.   On:  03/10/2014 16:46    EKG:  Normal sinus rhythm, mild abnormalities ST segment which is new from previous tracing; normal axis.  Assessment/Plan 1-type 1 diabetes and DKA: With concerns of DKA to be secondary to NSTEMI versus insufficient insulin (patient went away for this over the weekend without any insulin and I spent more than 18 hours without treatment). -Patient will be started on DKA protocol -Check hemoglobin A1c (last one 7.6). -patient denies the use of any recreational drugs. -PRN oxygen  2-Non-STEMI (non-ST elevated myocardial infarction): elevated troponin and new changes on EKG. Patient w/o CP but presenting on DKA. Patient also  with hx of HTN and former smoker as part of risk factors. -Will need to accept none -Patient will be started on heparin drip -Continue aspirin, lipitor and beta blocker -Will check lipid profile -Cardiology has been consulted and patient was slightly will require catheter once stable and kidney function at baseline. -Will cycle troponins and cardiac enzymes.  3-HYPOTHYROIDISM NOS: Will check TSH. Continue Synthroid    4-HYPERTENSION: Will continue metoprolol. Blood pressure soft at this moment.   5-COPD: Patient with history of former smoking; quit approximately 10 years ago. At this moment his no receiving any inhalers or nebulizer treatment.  -Will use as needed albuterol.   6-acute on Chronic renal failure, stage 2-3 (moderate): Will slightly secondary to DKA, decreased perfusion from NSTEMI, progression of diabetic nephropathy and mild hypotension. -will follow renal function -GFR at baseline around 70's -Cr fluctuates between 1-1.4  7-GERD: on PPI  8-depression: Continue Lexapro   Cardiology (Dr. Gala Romney)  Code Status: Full Family Communication: daughter in law at bedside Disposition Plan: inpatient, stepdown; LOS > 2 midnights   Time spent: 55 minutes  Vassie Loll [facility] Pager [phone]  **Disclaimer: This  note may have been dictated with voice recognition software. Similar sounding words can inadvertently be transcribed and this note may contain transcription errors which may not have been corrected upon publication of note.**

## 2014-03-10 NOTE — ED Notes (Signed)
Patient transported to X-ray 

## 2014-03-10 NOTE — ED Notes (Signed)
Attempted report 

## 2014-03-10 NOTE — ED Notes (Signed)
Dr. Ottie Glazier- admitting MD at bedside.

## 2014-03-11 ENCOUNTER — Inpatient Hospital Stay (HOSPITAL_COMMUNITY): Payer: Medicare Other

## 2014-03-11 DIAGNOSIS — R0989 Other specified symptoms and signs involving the circulatory and respiratory systems: Secondary | ICD-10-CM

## 2014-03-11 LAB — BASIC METABOLIC PANEL
ANION GAP: 12 (ref 5–15)
ANION GAP: 13 (ref 5–15)
BUN: 38 mg/dL — ABNORMAL HIGH (ref 6–23)
BUN: 39 mg/dL — ABNORMAL HIGH (ref 6–23)
CHLORIDE: 100 meq/L (ref 96–112)
CO2: 21 mEq/L (ref 19–32)
CO2: 24 mEq/L (ref 19–32)
Calcium: 7.7 mg/dL — ABNORMAL LOW (ref 8.4–10.5)
Calcium: 7.8 mg/dL — ABNORMAL LOW (ref 8.4–10.5)
Chloride: 101 mEq/L (ref 96–112)
Creatinine, Ser: 1.33 mg/dL (ref 0.50–1.35)
Creatinine, Ser: 1.32 mg/dL (ref 0.50–1.35)
GFR calc non Af Amer: 52 mL/min — ABNORMAL LOW (ref >90)
GFR calc non Af Amer: 53 mL/min — ABNORMAL LOW (ref >90)
GFR, EST AFRICAN AMERICAN: 60 mL/min — AB (ref >90)
GFR, EST AFRICAN AMERICAN: 61 mL/min — AB (ref >90)
Glucose, Bld: 140 mg/dL — ABNORMAL HIGH (ref 70–99)
Glucose, Bld: 80 mg/dL (ref 70–99)
POTASSIUM: 4.2 meq/L (ref 3.7–5.3)
POTASSIUM: 4.3 meq/L (ref 3.7–5.3)
Sodium: 135 mEq/L — ABNORMAL LOW (ref 137–147)
Sodium: 136 mEq/L — ABNORMAL LOW (ref 137–147)

## 2014-03-11 LAB — CK TOTAL AND CKMB (NOT AT ARMC)
CK, MB: 11.8 ng/mL (ref 0.3–4.0)
CK, MB: 13.7 ng/mL (ref 0.3–4.0)
CK, MB: 12.3 ng/mL (ref 0.3–4.0)
Relative Index: 6.1 — ABNORMAL HIGH (ref 0.0–2.5)
Relative Index: 6 — ABNORMAL HIGH (ref 0.0–2.5)
Relative Index: 7.5 — ABNORMAL HIGH (ref 0.0–2.5)
Total CK: 192 U/L (ref 7–232)
Total CK: 227 U/L (ref 7–232)
Total CK: 163 U/L (ref 7–232)

## 2014-03-11 LAB — CBC
HEMATOCRIT: 29.7 % — AB (ref 39.0–52.0)
Hemoglobin: 9.9 g/dL — ABNORMAL LOW (ref 13.0–17.0)
MCH: 32.9 pg (ref 26.0–34.0)
MCHC: 33.3 g/dL (ref 30.0–36.0)
MCV: 98.7 fL (ref 78.0–100.0)
Platelets: 161 10*3/uL (ref 150–400)
RBC: 3.01 MIL/uL — ABNORMAL LOW (ref 4.22–5.81)
RDW: 13 % (ref 11.5–15.5)
WBC: 8.8 10*3/uL (ref 4.0–10.5)

## 2014-03-11 LAB — GLUCOSE, CAPILLARY
GLUCOSE-CAPILLARY: 123 mg/dL — AB (ref 70–99)
GLUCOSE-CAPILLARY: 96 mg/dL (ref 70–99)
Glucose-Capillary: 63 mg/dL — ABNORMAL LOW (ref 70–99)
Glucose-Capillary: 97 mg/dL (ref 70–99)
Glucose-Capillary: 114 mg/dL — ABNORMAL HIGH (ref 70–99)
Glucose-Capillary: 257 mg/dL — ABNORMAL HIGH (ref 70–99)
Glucose-Capillary: 294 mg/dL — ABNORMAL HIGH (ref 70–99)
Glucose-Capillary: 232 mg/dL — ABNORMAL HIGH (ref 70–99)

## 2014-03-11 LAB — LIPID PANEL
Cholesterol: 105 mg/dL (ref 0–200)
HDL: 43 mg/dL (ref >39)
LDL Cholesterol: 46 mg/dL (ref 0–99)
Total CHOL/HDL Ratio: 2.4 RATIO
Triglycerides: 78 mg/dL (ref <150)
VLDL: 16 mg/dL (ref 0–40)

## 2014-03-11 LAB — HEMOGLOBIN A1C
HEMOGLOBIN A1C: 6.5 % — AB (ref <5.7)
Hgb A1c MFr Bld: 6.4 % — ABNORMAL HIGH (ref <5.7)
MEAN PLASMA GLUCOSE: 140 mg/dL — AB (ref <117)
Mean Plasma Glucose: 137 mg/dL — ABNORMAL HIGH (ref <117)

## 2014-03-11 LAB — TROPONIN I
TROPONIN I: 9.08 ng/mL — AB (ref <0.30)
Troponin I: 5.87 ng/mL (ref <0.30)
Troponin I: 3.67 ng/mL (ref <0.30)
Troponin I: 3.26 ng/mL (ref <0.30)
Troponin I: 2.58 ng/mL

## 2014-03-11 LAB — HEPARIN LEVEL (UNFRACTIONATED)
Heparin Unfractionated: 0.26 IU/mL — ABNORMAL LOW (ref 0.30–0.70)
Heparin Unfractionated: 0.45 [IU]/mL (ref 0.30–0.70)

## 2014-03-11 MED ORDER — OXYCODONE-ACETAMINOPHEN 5-325 MG PO TABS
1.0000 | ORAL_TABLET | ORAL | Status: DC | PRN
  Administered 2014-03-11 – 2014-03-13 (×6): 1 via ORAL
  Filled 2014-03-11 (×6): qty 1

## 2014-03-11 MED ORDER — INSULIN ASPART 100 UNIT/ML ~~LOC~~ SOLN
0.0000 [IU] | Freq: Every day | SUBCUTANEOUS | Status: DC
  Administered 2014-03-11: 2 [IU] via SUBCUTANEOUS

## 2014-03-11 MED ORDER — DIPHENOXYLATE-ATROPINE 2.5-0.025 MG PO TABS
1.0000 | ORAL_TABLET | Freq: Four times a day (QID) | ORAL | Status: DC | PRN
  Administered 2014-03-11 – 2014-03-13 (×3): 1 via ORAL
  Filled 2014-03-11 (×3): qty 1

## 2014-03-11 MED ORDER — INSULIN GLARGINE 100 UNIT/ML ~~LOC~~ SOLN
5.0000 [IU] | Freq: Every day | SUBCUTANEOUS | Status: DC
  Administered 2014-03-12 – 2014-03-13 (×2): 5 [IU] via SUBCUTANEOUS
  Filled 2014-03-11 (×2): qty 0.05

## 2014-03-11 MED ORDER — INSULIN ASPART 100 UNIT/ML ~~LOC~~ SOLN
0.0000 [IU] | SUBCUTANEOUS | Status: DC
  Administered 2014-03-11: 2 [IU] via SUBCUTANEOUS

## 2014-03-11 MED ORDER — SODIUM CHLORIDE 0.9 % IV SOLN
INTRAVENOUS | Status: DC
  Administered 2014-03-11: 11:00:00 via INTRAVENOUS
  Administered 2014-03-12: 1000 mL via INTRAVENOUS

## 2014-03-11 MED ORDER — INSULIN GLARGINE 100 UNIT/ML ~~LOC~~ SOLN
5.0000 [IU] | Freq: Once | SUBCUTANEOUS | Status: AC
  Administered 2014-03-11: 5 [IU] via SUBCUTANEOUS
  Filled 2014-03-11: qty 0.05

## 2014-03-11 MED ORDER — INSULIN ASPART 100 UNIT/ML ~~LOC~~ SOLN
0.0000 [IU] | Freq: Three times a day (TID) | SUBCUTANEOUS | Status: DC
  Administered 2014-03-11 (×2): 5 [IU] via SUBCUTANEOUS

## 2014-03-11 MED ORDER — HEPARIN (PORCINE) IN NACL 100-0.45 UNIT/ML-% IJ SOLN
1100.0000 [IU]/h | INTRAMUSCULAR | Status: DC
Start: 2014-03-11 — End: 2014-03-13
  Administered 2014-03-11 (×2): 1000 [IU]/h via INTRAVENOUS
  Administered 2014-03-13: 1100 [IU]/h via INTRAVENOUS
  Filled 2014-03-11 (×4): qty 250

## 2014-03-11 MED ORDER — HYDROMORPHONE HCL PF 1 MG/ML IJ SOLN: 1.0000 mg | INTRAMUSCULAR | Status: DC | PRN

## 2014-03-11 MED ORDER — INSULIN ASPART 100 UNIT/ML ~~LOC~~ SOLN: 0.0000 [IU] | SUBCUTANEOUS | Status: DC

## 2014-03-11 MED ORDER — METOPROLOL TARTRATE 12.5 MG HALF TABLET
12.5000 mg | ORAL_TABLET | Freq: Two times a day (BID) | ORAL | Status: DC
  Administered 2014-03-11 – 2014-03-12 (×3): 12.5 mg via ORAL
  Filled 2014-03-11 (×6): qty 1

## 2014-03-11 NOTE — Progress Notes (Signed)
CRITICAL VALUE ALERT  Critical value received:  CKMB 13.7, Troponin 3.26  Date of notification:  03/11/14  Time of notification:  1705  Critical value read back:Yes.    Nurse who received alert:  Lora Havens  MD notified (1st page):  Rai  Time of first page:  1707  MD notified (2nd page):  Time of second page:  Responding MD:    Time MD responded:

## 2014-03-11 NOTE — Progress Notes (Signed)
    Subjective:  Denies CP or dyspnea   Objective:  Filed Vitals:   03/11/14 0200 03/11/14 0355 03/11/14 0600 03/11/14 0813  BP: 105/38 119/50 123/48 120/42  Pulse: 62 69 63 64  Temp:  97.7 F (36.5 C)  98.3 F (36.8 C)  TempSrc:  Oral  Oral  Resp: 8 22 18 12   Height:      Weight:      SpO2: 98% 97% 96% 95%    Intake/Output from previous day:  Intake/Output Summary (Last 24 hours) at 03/11/14 0933 Last data filed at 03/11/14 6789  Gross per 24 hour  Intake   1214 ml  Output    975 ml  Net    239 ml    Physical Exam: Physical exam: Well-developed well-nourished in no acute distress.  Skin is warm and dry.  HEENT is normal.  Neck is supple.  Chest is clear to auscultation with normal expansion.  Cardiovascular exam is regular rate and rhythm.  Abdominal exam nontender or distended. No masses palpated. Extremities show no edema. neuro grossly intact    Lab Results: Basic Metabolic Panel:  Recent Labs  38/10/17 0005 03/11/14 0125  NA 135* 136*  K 4.2 4.3  CL 101 100  CO2 21 24  GLUCOSE 140* 80  BUN 38* 39*  CREATININE 1.33 1.32  CALCIUM 7.7* 7.8*   CBC:  Recent Labs  03/10/14 2105 03/11/14 0645  WBC 8.3 8.8  HGB 10.1* 9.9*  HCT 30.9* 29.7*  MCV 100.3* 98.7  PLT 160 161   Cardiac Enzymes:  Recent Labs  03/10/14 2105 03/11/14 0125 03/11/14 0645  TROPONINI 5.28* 9.08* 5.87*     Assessment/Plan:  1 NSTEMI-Continue ASA, heparin, lopressor and statin; plan cath when DKA and renal function improves (probably Tuesday); await echo for LV function 2 DKA-management per primary care 3 Hypertension-continue present meds 4 Hyperlipidemia-continue statin 5 COPD 6 Chronic stage 3 renal failure-improving with hydration; follow BMET 7 PAD-Continue ASA and statin. Will need FU carotid dopplers and ABIs.   Olga Millers 03/11/2014, 9:33 AM

## 2014-03-11 NOTE — Progress Notes (Signed)
Carotid Doppler has been completed. Bilateral:  1-39% ICA stenosis.  Vertebral artery flow is antegrade.    Farrel Demark, RDMS, RVT 03/11/2014

## 2014-03-11 NOTE — Progress Notes (Signed)
Patient ID: Benjamin Hodge  male  PJK:[id number]    DOB: 1941-02-01    DOA: 03/10/2014  PCP: Bufford Spikes, DO  Assessment/Plan: Principal Problem:   Non-STEMI (non-ST elevated myocardial infarction) - Currently no chest pain, troponins trending down, on heparin drip - Cardiology following, 2-D echo pending, plan on cardiac cath on 7/7 if the renal function is improving - Continue aspirin, beta blocker, statin - Continue gentle hydration -Carotid Dopplers showed 1-39% ICA stenosis  Active Problems:   DKA (diabetic ketoacidoses) with uncontrolled diabetes mellitus - Obtain hemoglobin A1c - Placed on carb modify diet, sliding scale insulin and Lantus    HYPOTHYROIDISM NOS - Continue Synthroid, TSH 0.525    HYPERTENSION - Currently stable    COPD - Currently stable    Chronic renal failure, stage 3 (moderate) - Baseline creatinine 1.0 on 6/5, continue gentle hydration, renal function improving  DVT Prophylaxis: Heparin drip  Code Status: Full code  Family Communication: Patient alert and oriented x4, discussed with the patient  Disposition: Follow 2-D echo results, okay to transfer to telemetry per cardiology if stable  Consultants:  Cardiology  Procedures:  None  Antibiotics:  None    Subjective: Patient seen and examined, no chest pain, nausea, vomiting or any abdominal pain. Asking for food.  Objective: Weight change:   Intake/Output Summary (Last 24 hours) at 03/11/14 1028 Last data filed at 03/11/14 0839  Gross per 24 hour  Intake   1214 ml  Output    975 ml  Net    239 ml   Blood pressure 120/42, pulse 64, temperature 98.3 F (36.8 C), temperature source Oral, resp. rate 12, height 5\' 7"  (1.702 m), weight 56.7 kg (125 lb), SpO2 95.00%.  Physical Exam: General: Alert and awake, oriented x3, not in any acute distress. CVS: S1-S2 clear, no murmur rubs or gallops Chest: clear to auscultation bilaterally, no wheezing, rales or rhonchi Abdomen:  soft nontender, nondistended, normal bowel sounds  Extremities: no cyanosis, clubbing or edema noted bilaterally Neuro: Cranial nerves II-XII intact, no focal neurological deficits  Lab Results: Basic Metabolic Panel:  Recent Labs Lab 03/11/14 0005 03/11/14 0125  NA 135* 136*  K 4.2 4.3  CL 101 100  CO2 21 24  GLUCOSE 140* 80  BUN 38* 39*  CREATININE 1.33 1.32  CALCIUM 7.7* 7.8*   Liver Function Tests:  Recent Labs Lab 03/10/14 1546  AST 54*  ALT 36  ALKPHOS 70  BILITOT 0.5  PROT 6.0  ALBUMIN 3.3*   No results found for this basename: LIPASE, AMYLASE,  in the last 168 hours No results found for this basename: AMMONIA,  in the last 168 hours CBC:  Recent Labs Lab 03/10/14 2105 03/11/14 0645  WBC 8.3 8.8  HGB 10.1* 9.9*  HCT 30.9* 29.7*  MCV 100.3* 98.7  PLT 160 161   Cardiac Enzymes:  Recent Labs Lab 03/10/14 2105 03/11/14 0125 03/11/14 0645  TROPONINI 5.28* 9.08* 5.87*   BNP: No components found with this basename: POCBNP,  CBG:  Recent Labs Lab 03/11/14 0056 03/11/14 0201 03/11/14 0220 03/11/14 0356 03/11/14 0812  GLUCAP 96 63* 97 123* 114*     Micro Results: Recent Results (from the past 240 hour(s))  MRSA PCR SCREENING     Status: None   Collection Time    03/10/14  8:00 PM      Result Value Ref Range Status   MRSA by PCR NEGATIVE  NEGATIVE Final   Comment:  The GeneXpert MRSA Assay (FDA     approved for NASAL specimens     only), is one component of a     comprehensive MRSA colonization     surveillance program. It is not     intended to diagnose MRSA     infection nor to guide or     monitor treatment for     MRSA infections.    Studies/Results: Dg Chest 2 View  03/10/2014   CLINICAL DATA:  Hyperglycemia. Dizziness. Shortness of breath. Current history of COPD, hypertension and diabetes.  EXAM: CHEST  2 VIEW  COMPARISON:  Portable chest x-ray 08/06/2010. Two-view chest x-ray 11/28/2008. CT chest 08/06/2010.   FINDINGS: Cardiac silhouette mildly to moderately enlarged but stable. Thoracic aorta atherosclerotic, unchanged. Prominent central pulmonary arteries, unchanged. Chronic elevation of the left hemidiaphragm and chronic scar/atelectasis in the left lower lobe, unchanged. Emphysematous changes in the upper lobes, unchanged. Lungs otherwise clear. No localized airspace consolidation. No pleural effusions. No pneumothorax. Normal pulmonary vascularity. Degenerative changes involving the thoracic spine.  IMPRESSION: COPD/emphysema. Stable cardiomegaly. Stable chronic elevation of the left hemidiaphragm and chronic scar/atelectasis in the left lower lobe. No acute cardiopulmonary disease.   Electronically Signed   By: Hulan Saashomas  Lawrence M.D.   On: 03/10/2014 16:46   [place] Aorta  03/11/2014   CLINICAL DATA:  Peripheral arterial disease. Evaluate for abdominal aortic aneurysm.  EXAM: ULTRASOUND OF ABDOMINAL AORTA  TECHNIQUE: Ultrasound examination of the abdominal aorta was performed to evaluate for abdominal aortic aneurysm.  COMPARISON:  One-view abdomen 11/26/2008  FINDINGS: Abdominal Aorta  Diffuse atherosclerotic irregularity is present within the abdominal aorta. There is no focal aneurysm or stenosis.  Maximum AP  Diameter:  2.1 cm  Maximum TRV  Diameter: 2.2 cm  The iliac vessels are of normal size.  IMPRESSION: 1. Diffuse atherosclerotic changes of the abdominal aorta and proximal iliac vessels. 2. No focal aneurysm or stenosis.   Electronically Signed   By: Gennette Pachris  Mattern M.D.   On: 03/11/2014 08:15    Medications: Scheduled Meds: . aspirin EC  81 mg Oral Daily  . atorvastatin  40 mg Oral q1800  . escitalopram  20 mg Oral Daily  . insulin aspart  0-5 Units Subcutaneous QHS  . insulin aspart  0-9 Units Subcutaneous TID WC  . levothyroxine  75 mcg Oral QAC breakfast  . methylphenidate  20 mg Oral BID  . metoprolol tartrate  12.5 mg Oral BID  . pantoprazole  40 mg Oral Q1200  . pregabalin  50 mg Oral TID    . vitamin B-12  1,000 mcg Oral Daily      LOS: 1 day   Douglass Dunshee M.D. [facility] 03/11/2014, 10:28 AM Pager: [phone]  If 7PM-7AM, please contact night-coverage [email or web address] Password [email or web address]  **Disclaimer: This note was dictated with voice recognition software. Similar sounding words can inadvertently be transcribed and this note may contain transcription errors which may not have been corrected upon publication of note.**

## 2014-03-11 NOTE — Progress Notes (Signed)
ANTICOAGULATION CONSULT NOTE - Follow Up Consult  Pharmacy Consult for Heparin Indication: NSTEMI  Allergies  Allergen Reactions  . Ace Inhibitors Other (See Comments)    dizzy  . Angiotensin Receptor Blockers Other (See Comments)    unknown  . Bee Venom Swelling  . Cymbalta [Duloxetine Hcl] Other (See Comments)    dizzy    Patient Measurements: Height: 5\' 7"  (170.2 cm) Weight: 125 lb (56.7 kg) IBW/kg (Calculated) : 66.1 Heparin Dosing Weight: 57kg  Vital Signs: Temp: 97.7 F (36.5 C) (07/05 0355) Temp src: Oral (07/05 0355) BP: 120/42 mmHg (07/05 0813) Pulse Rate: 64 (07/05 0813)  Labs:  Recent Labs  03/10/14 1546 03/10/14 2105 03/11/14 0005 03/11/14 0125 03/11/14 0645  HGB 10.2* 10.1*  --   --  9.9*  HCT 31.8* 30.9*  --   --  29.7*  PLT 167 160  --   --  161  HEPARINUNFRC  --   --   --   --  0.26*  CREATININE 1.73* 1.48* 1.33 1.32  --   TROPONINI 1.27* 5.28*  --  9.08* 5.87*    Estimated Creatinine Clearance: 41.2 ml/min (by C-G formula based on Cr of 1.32).   Medications:  Heparin @ 800 units/hr  Assessment: 73yom continues on heparin for NSTEMI in the setting of DKA. Initial heparin level is below goal at 0.26. CBC is stable. No bleeding reported. Noted plan for cath once medically stable.  Goal of Therapy:  Heparin level 0.3-0.7 units/ml Monitor platelets by anticoagulation protocol: Yes   Plan:  1) Increase heparin to 1000 units/hr 2) Check heparin level 8 hours after rate change  Fredrik Rigger 03/11/2014,8:17 AM

## 2014-03-11 NOTE — Progress Notes (Signed)
ANTICOAGULATION CONSULT NOTE - Follow Up Consult  Pharmacy Consult for Heparin Indication: NSTEMI  Allergies  Allergen Reactions  . Ace Inhibitors Other (See Comments)    dizzy  . Angiotensin Receptor Blockers Other (See Comments)    unknown  . Bee Venom Swelling  . Cymbalta [Duloxetine Hcl] Other (See Comments)    dizzy    Patient Measurements: Height: 5\' 7"  (170.2 cm) Weight: 125 lb (56.7 kg) IBW/kg (Calculated) : 66.1 Heparin Dosing Weight: 57kg  Vital Signs: Temp: 97.5 F (36.4 C) (07/05 1513) Temp src: Oral (07/05 1513) BP: 150/58 mmHg (07/05 1513) Pulse Rate: 73 (07/05 1513)  Labs:  Recent Labs  03/10/14 1546 03/10/14 2105 03/11/14 0005 03/11/14 0125 03/11/14 0645 03/11/14 1024 03/11/14 1605  HGB 10.2* 10.1*  --   --  9.9*  --   --   HCT 31.8* 30.9*  --   --  29.7*  --   --   PLT 167 160  --   --  161  --   --   HEPARINUNFRC  --   --   --   --  0.26*  --  0.45  CREATININE 1.73* 1.48* 1.33 1.32  --   --   --   CKTOTAL  --   --   --   --   --  192 227  CKMB  --   --   --   --   --  11.8* 13.7*  TROPONINI 1.27* 5.28*  --  9.08* 5.87* 3.67* 3.26*    Estimated Creatinine Clearance: 41.2 ml/min (by C-G formula based on Cr of 1.32).   Medications:  Heparin @ 1000 units/hr  Assessment: 8 hour heparin level = 0.45 on 1000 units/hr in this 73 yo male on heparin drip for NSTEMI. No bleeding reported. PLTC 161K.  Noted plan for cath once medically stable.   Goal of Therapy:  Heparin level 0.3-0.7 units/ml Monitor platelets by anticoagulation protocol: Yes   Plan:  Continue heparin drip at 1000 units/hr 6 hr HL to confirm therapeutic. Daily AM heparin level and CBC.  Noah Delaine, RPh Clinical Pharmacist Pager: [phone] 03/11/2014,5:36 PM

## 2014-03-11 NOTE — Progress Notes (Addendum)
CRITICAL VALUE ALERT  Critical value received:  CKMB 11.8  Date of notification:  03/11/14  Time of notification:  1200  Critical value read back:Yes.    Nurse who received alert:  Lora Havens  MD notified (1st page):  Rai  Time of first page:  1215  MD notified (2nd page):  Time of second page:  Responding MD:  Isidoro Donning  Time MD responded: 1218

## 2014-03-12 DIAGNOSIS — I10 Essential (primary) hypertension: Secondary | ICD-10-CM

## 2014-03-12 DIAGNOSIS — I059 Rheumatic mitral valve disease, unspecified: Secondary | ICD-10-CM

## 2014-03-12 DIAGNOSIS — R197 Diarrhea, unspecified: Secondary | ICD-10-CM

## 2014-03-12 DIAGNOSIS — F411 Generalized anxiety disorder: Secondary | ICD-10-CM

## 2014-03-12 DIAGNOSIS — K589 Irritable bowel syndrome without diarrhea: Secondary | ICD-10-CM

## 2014-03-12 LAB — CBC
HCT: 31.4 % — ABNORMAL LOW (ref 39.0–52.0)
HCT: 31.7 % — ABNORMAL LOW (ref 39.0–52.0)
HEMOGLOBIN: 10.4 g/dL — AB (ref 13.0–17.0)
Hemoglobin: 10.3 g/dL — ABNORMAL LOW (ref 13.0–17.0)
MCH: 33.4 pg (ref 26.0–34.0)
MCH: 32.6 pg (ref 26.0–34.0)
MCHC: 33.1 g/dL (ref 30.0–36.0)
MCHC: 32.5 g/dL (ref 30.0–36.0)
MCV: 101 fL — ABNORMAL HIGH (ref 78.0–100.0)
MCV: 100.3 fL — AB (ref 78.0–100.0)
PLATELETS: 198 10*3/uL (ref 150–400)
Platelets: 174 10*3/uL (ref 150–400)
RBC: 3.11 MIL/uL — ABNORMAL LOW (ref 4.22–5.81)
RBC: 3.16 MIL/uL — ABNORMAL LOW (ref 4.22–5.81)
RDW: 13.3 % (ref 11.5–15.5)
RDW: 13.4 % (ref 11.5–15.5)
WBC: 6.1 10*3/uL (ref 4.0–10.5)
WBC: 6 10*3/uL (ref 4.0–10.5)

## 2014-03-12 LAB — GLUCOSE, CAPILLARY
GLUCOSE-CAPILLARY: 191 mg/dL — AB (ref 70–99)
GLUCOSE-CAPILLARY: 96 mg/dL (ref 70–99)
Glucose-Capillary: 161 mg/dL — ABNORMAL HIGH (ref 70–99)
Glucose-Capillary: 169 mg/dL — ABNORMAL HIGH (ref 70–99)
Glucose-Capillary: 191 mg/dL — ABNORMAL HIGH (ref 70–99)

## 2014-03-12 LAB — PROTIME-INR
INR: 0.91 (ref 0.00–1.49)
Prothrombin Time: 12.3 seconds (ref 11.6–15.2)

## 2014-03-12 LAB — BASIC METABOLIC PANEL
ANION GAP: 12 (ref 5–15)
Anion gap: 12 (ref 5–15)
BUN: 25 mg/dL — AB (ref 6–23)
BUN: 18 mg/dL (ref 6–23)
CALCIUM: 8.1 mg/dL — AB (ref 8.4–10.5)
CHLORIDE: 106 meq/L (ref 96–112)
CO2: 23 mEq/L (ref 19–32)
CO2: 25 meq/L (ref 19–32)
Calcium: 8.6 mg/dL (ref 8.4–10.5)
Chloride: 104 mEq/L (ref 96–112)
Creatinine, Ser: 0.96 mg/dL (ref 0.50–1.35)
Creatinine, Ser: 0.99 mg/dL (ref 0.50–1.35)
GFR calc Af Amer: >90 mL/min (ref >90)
GFR calc Af Amer: >90 mL/min (ref >90)
GFR calc non Af Amer: 81 mL/min — ABNORMAL LOW (ref >90)
GFR calc non Af Amer: 80 mL/min — ABNORMAL LOW (ref >90)
GLUCOSE: 175 mg/dL — AB (ref 70–99)
Glucose, Bld: 188 mg/dL — ABNORMAL HIGH (ref 70–99)
Potassium: 4.4 mEq/L (ref 3.7–5.3)
Potassium: 5.3 mEq/L (ref 3.7–5.3)
Sodium: 139 mEq/L (ref 137–147)
Sodium: 143 mEq/L (ref 137–147)

## 2014-03-12 LAB — HEPARIN LEVEL (UNFRACTIONATED)
Heparin Unfractionated: <0.1 IU/mL — ABNORMAL LOW (ref 0.30–0.70)
Heparin Unfractionated: 0.25 IU/mL — ABNORMAL LOW (ref 0.30–0.70)
Heparin Unfractionated: 0.56 IU/mL (ref 0.30–0.70)
Heparin Unfractionated: 0.63 IU/mL (ref 0.30–0.70)

## 2014-03-12 LAB — CLOSTRIDIUM DIFFICILE BY PCR: CDIFFPCR: NEGATIVE

## 2014-03-12 LAB — PLATELET INHIBITION P2Y12: Platelet Function  P2Y12: 330 [PRU] (ref 194–418)

## 2014-03-12 LAB — TROPONIN I: Troponin I: 1.14 ng/mL (ref <0.30)

## 2014-03-12 MED ORDER — INSULIN ASPART 100 UNIT/ML ~~LOC~~ SOLN
0.0000 [IU] | Freq: Three times a day (TID) | SUBCUTANEOUS | Status: DC
  Administered 2014-03-12 (×3): 3 [IU] via SUBCUTANEOUS
  Administered 2014-03-13: 11 [IU] via SUBCUTANEOUS

## 2014-03-12 MED ORDER — INSULIN ASPART 100 UNIT/ML ~~LOC~~ SOLN
3.0000 [IU] | Freq: Three times a day (TID) | SUBCUTANEOUS | Status: DC
  Administered 2014-03-12 (×3): 3 [IU] via SUBCUTANEOUS

## 2014-03-12 MED ORDER — ASPIRIN 81 MG PO CHEW
81.0000 mg | CHEWABLE_TABLET | ORAL | Status: AC
  Administered 2014-03-13: 81 mg via ORAL
  Filled 2014-03-12: qty 1

## 2014-03-12 MED ORDER — SODIUM CHLORIDE 0.9 % IV SOLN
250.0000 mL | INTRAVENOUS | Status: DC | PRN
Start: 2014-03-12 — End: 2014-03-13

## 2014-03-12 MED ORDER — SODIUM CHLORIDE 0.9 % IV SOLN
INTRAVENOUS | Status: DC
  Administered 2014-03-13: 03:00:00 via INTRAVENOUS

## 2014-03-12 MED ORDER — SODIUM CHLORIDE 0.9 % IJ SOLN: 3.0000 mL | Freq: Two times a day (BID) | INTRAMUSCULAR | Status: DC

## 2014-03-12 MED ORDER — ASPIRIN EC 81 MG PO TBEC: 81.0000 mg | DELAYED_RELEASE_TABLET | Freq: Every day | ORAL | Status: DC

## 2014-03-12 MED ORDER — SODIUM CHLORIDE 0.9 % IJ SOLN: 3.0000 mL | INTRAMUSCULAR | Status: DC | PRN

## 2014-03-12 NOTE — Progress Notes (Signed)
  Echocardiogram 2D Echocardiogram has been performed.  Benjamin Hodge 03/12/2014, 12:37 PM

## 2014-03-12 NOTE — Evaluation (Addendum)
Pt evaluation at bedside. RE: Fall  Principal issues for admission: NSTEMI, DKA. On heparin gtt. Downtrending trop.  Was getting up to use bathroom because of recurrent diarrhea- has baseline IBS-diarrhea predominant. Diarrhea non bloody.  Slipped at bedside, sliding down bed and landing on back side.  No CP, SOB prior to fall.  Mild dizziness. Has been present since admission.  States that he did strike his head on side of bed as he slid down. Mild trauma per pt. No active headache/head pain.  Per nurse- pt has been getting out of bed without supervision and not following orders consistently all night.  Exam WNL  VSS Most recent CBG WNL.   Plan:  Fall precautions.  Orthostatics Trop x1 C diff  Defer imaging if HA ensues/clinical course deteriorates  Hemoccult if hgb downtrends.

## 2014-03-12 NOTE — Progress Notes (Signed)
VASCULAR LAB PRELIMINARY  ARTERIAL  ABI completed:ABIs appear to be WNL, however, velocities may be falsely elevated secondary to calcified vessels.     RIGHT    LEFT    PRESSURE WAVEFORM  PRESSURE WAVEFORM  BRACHIAL 184 T BRACHIAL 185 T  DP   DP    AT 216 B AT 290 B  PT 192 B PT 283 B  PER   PER    GREAT TOE  NA GREAT TOE  NA    RIGHT LEFT  ABI >1.0 >1.0     Orchid Glassberg, RVT 03/12/2014, 6:18 PM

## 2014-03-12 NOTE — Progress Notes (Signed)
ANTICOAGULATION CONSULT NOTE - Follow Up Consult  Pharmacy Consult for Heparin Indication: chest pain/ACS  Allergies  Allergen Reactions  . Ace Inhibitors Other (See Comments)    dizzy  . Angiotensin Receptor Blockers Other (See Comments)    unknown  . Bee Venom Swelling  . Cymbalta [Duloxetine Hcl] Other (See Comments)    dizzy    Patient Measurements: Height: 5\' 7"  (170.2 cm) Weight: 126 lb (57.153 kg) IBW/kg (Calculated) : 66.1 Heparin Dosing Weight: 57kg  Vital Signs: Temp: 97.2 F (36.2 C) (07/06 1300) Temp src: Oral (07/06 1300) BP: 175/68 mmHg (07/06 1300) Pulse Rate: 73 (07/06 1300)  Labs:  Recent Labs  03/11/14 0125  03/11/14 0645 03/11/14 1024 03/11/14 1605 03/11/14 2208 03/12/14 0355 03/12/14 0801 03/12/14 1125 03/12/14 1430 03/12/14 1921  HGB  --   --  9.9*  --   --   --  10.4*  --   --  10.3*  --   HCT  --   --  29.7*  --   --   --  31.4*  --   --  31.7*  --   PLT  --   --  161  --   --   --  174  --   --  198  --   LABPROT  --   --   --   --   --   --   --   --   --  12.3  --   INR  --   --   --   --   --   --   --   --   --  0.91  --   HEPARINUNFRC  --   < > 0.26*  --  0.45 <0.10* 0.25*  --  0.56  --  0.63  CREATININE 1.32  --   --   --   --   --  0.96  --   --  0.99  --   CKTOTAL  --   --   --  192 227 163  --   --   --   --   --   CKMB  --   --   --  11.8* 13.7* 12.3*  --   --   --   --   --   TROPONINI 9.08*  --  5.87* 3.67* 3.26* 2.58*  --  1.14*  --   --   --   < > = values in this interval not displayed.  Estimated Creatinine Clearance: 55.4 ml/min (by C-G formula based on Cr of 0.99).   Medications:  Heparin 1100 units/hr  Assessment: 73yom continues on heparin for NSTEMI with plan for cath in AM.  -Heparin level is therapeutic x2 levels - H/H and Plts stable - No significant bleeding reported  Goal of Therapy:  Heparin level 0.3-0.7 units/ml Monitor platelets by anticoagulation protocol: Yes   Plan:  1. Continue heparin  drip 1100 units/hr (11 ml/hr) 2. Daily heparin level, CBC 3. Follow-up post cath orders  Link Snuffer, PharmD, BCPS Clinical Pharmacist [phone] 03/12/2014,7:55 PM

## 2014-03-12 NOTE — Progress Notes (Signed)
ANTICOAGULATION CONSULT NOTE - Follow Up Consult  Pharmacy Consult for heparin Indication: NSTEMI  Labs:  Recent Labs  03/10/14 2105 03/11/14 0005 03/11/14 0125  03/11/14 0645 03/11/14 1024 03/11/14 1605 03/11/14 2208 03/12/14 0355  HGB 10.1*  --   --   --  9.9*  --   --   --  10.4*  HCT 30.9*  --   --   --  29.7*  --   --   --  31.4*  PLT 160  --   --   --  161  --   --   --  174  HEPARINUNFRC  --   --   --   < > 0.26*  --  0.45 <0.10* 0.25*  CREATININE 1.48* 1.33 1.32  --   --   --   --   --  0.96  CKTOTAL  --   --   --   --   --  192 227 163  --   CKMB  --   --   --   --   --  11.8* 13.7* 12.3*  --   TROPONINI 5.28*  --  9.08*  --  5.87* 3.67* 3.26* 2.58*  --   < > = values in this interval not displayed.   Assessment: 73yo male subtherapeutic on heparin, has increased significantly since IV was restarted.  Goal of Therapy:  Heparin level 0.3-0.7 units/ml   Plan:  Will increase heparin gtt slightly to 1100 units/hr and check level in 6hr.  Vernard Gambles, PharmD, BCPS  03/12/2014,5:30 AM

## 2014-03-12 NOTE — Progress Notes (Signed)
C-Diff culture came back negative.

## 2014-03-12 NOTE — ED Provider Notes (Signed)
Medical screening examination/treatment/procedure(s) were conducted as a shared visit with resident physician and myself.  I personally evaluated the patient during the encounter.    EKG Interpretation  Date/Time:  Saturday March 10 2014 15:31:17 EDT Ventricular Rate:  72 PR Interval:  132 QRS Duration: 108 QT Interval:  398 QTC Calculation: 435 R Axis:   81 Text Interpretation:  Sinus rhythm Probable left atrial enlargement Borderline right axis deviation Repol abnrm suggests ischemia, lateral leads Confirmed by Gale Hulse  MD, Alura Olveda ([id number]) on 03/10/2014 3:44:33 PM      I interviewed and examined the patient. Lungs are CTAB. Cardiac exam wnl. Abdomen soft.  Pt found to be hypotensive, hypoxic, in DKA w/ elevated troponin c/w NSTEMI. He denies any cp or sob. Will admit to stepdown.   CRITICAL CARE Performed by: Purvis Sheffield, S Total critical care time: 30 min Critical care time was exclusive of separately billable procedures and treating other patients. Critical care was necessary to treat or prevent imminent or life-threatening deterioration. Critical care was time spent personally by me on the following activities: development of treatment plan with patient and/or surrogate as well as nursing, discussions with consultants, evaluation of patient's response to treatment, examination of patient, obtaining history from patient or surrogate, ordering and performing treatments and interventions, ordering and review of laboratory studies, ordering and review of radiographic studies, pulse oximetry and re-evaluation of patient's condition.  Clinical Impression 1. ADHD, adult residual type   2. Chronic airway obstruction, not elsewhere classified   3. Chronic renal failure, stage 3 (moderate)   4. Type 1 diabetes mellitus with ketoacidosis and without coma   5. Unspecified hypothyroidism   6. Non-ST elevation MI (NSTEMI)   7. Non-STEMI (non-ST elevated myocardial infarction)   8. Anxiety  state, unspecified   9. Diarrhea   10. Irritable bowel syndrome       Junius Argyle, MD 03/12/14 2039

## 2014-03-12 NOTE — Progress Notes (Signed)
Utilization review completed. Robyn Nohr, RN, BSN. 

## 2014-03-12 NOTE — Progress Notes (Signed)
ANTICOAGULATION CONSULT NOTE - Follow Up Consult  Pharmacy Consult for Heparin Indication: chest pain/ACS  Allergies  Allergen Reactions  . Ace Inhibitors Other (See Comments)    dizzy  . Angiotensin Receptor Blockers Other (See Comments)    unknown  . Bee Venom Swelling  . Cymbalta [Duloxetine Hcl] Other (See Comments)    dizzy    Patient Measurements: Height: 5\' 7"  (170.2 cm) Weight: 125 lb (56.7 kg) IBW/kg (Calculated) : 66.1 Heparin Dosing Weight: 57kg  Vital Signs: Temp: 97.5 F (36.4 C) (07/06 1134) Temp src: Oral (07/06 1134) BP: 169/73 mmHg (07/06 1134) Pulse Rate: 63 (07/06 1134)  Labs:  Recent Labs  03/10/14 2105 03/11/14 0005 03/11/14 0125  03/11/14 0645 03/11/14 1024 03/11/14 1605 03/11/14 2208 03/12/14 0355 03/12/14 0801 03/12/14 1125  HGB 10.1*  --   --   --  9.9*  --   --   --  10.4*  --   --   HCT 30.9*  --   --   --  29.7*  --   --   --  31.4*  --   --   PLT 160  --   --   --  161  --   --   --  174  --   --   HEPARINUNFRC  --   --   --   < > 0.26*  --  0.45 <0.10* 0.25*  --  0.56  CREATININE 1.48* 1.33 1.32  --   --   --   --   --  0.96  --   --   CKTOTAL  --   --   --   --   --  192 227 163  --   --   --   CKMB  --   --   --   --   --  11.8* 13.7* 12.3*  --   --   --   TROPONINI 5.28*  --  9.08*  --  5.87* 3.67* 3.26* 2.58*  --  1.14*  --   < > = values in this interval not displayed.  Estimated Creatinine Clearance: 56.6 ml/min (by C-G formula based on Cr of 0.96).   Medications:  Heparin 1100 units/hr  Assessment: 73yom continues on heparin for NSTEMI. Heparin level (0.56) is now therapeutic after rate increase - will continue with current heparin rate and check follow-up level to confirm therapeutic. Noted plans for possible cath tomorrow. - H/H and Plts stable - No significant bleeding reported  Goal of Therapy:  Heparin level 0.3-0.7 units/ml Monitor platelets by anticoagulation protocol: Yes   Plan:  1. Continue heparin  drip 1100 units/hr (11 ml/hr) 2. Check follow-up heparin level @ 1800 to confirm therapeutic 3. Daily heparin level, CBC 4. Follow-up post cath orders  Braylan, Klever [phone] 03/12/2014,1:02 PM

## 2014-03-12 NOTE — Progress Notes (Signed)
Patient ID: Benjamin ProfferRobert W Hodge  male  ZOX:[id number]    DOB: 05-Feb-1941    DOA: 03/10/2014  PCP: Bufford SpikesEED, [name], DO  Assessment/Plan: Principal Problem:   Non-STEMI (non-ST elevated myocardial infarction) - Currently no chest pain, troponins trending down, on heparin drip - Cardiology following, 2-D echo pending, plan on cardiac cath on 7/7 - Continue aspirin, beta blocker, statin - Carotid Dopplers showed 1-39% ICA stenosis - 2-D echo still pending  Active Problems:   DKA (diabetic ketoacidoses) with uncontrolled diabetes mellitus - Hemoglobin A1c 6.4, continue  carb modify diet - Increased sliding scale insulin to moderate, meal coverage and Lantus  Acute renal insufficiency: Resolved - Likely worsened due to dehydration, DKA, resolved  Diarrhea: Per patient chronic due to IBS - Rule out C. difficile    HYPOTHYROIDISM NOS - Continue Synthroid, TSH 0.525    HYPERTENSION - Currently stable    COPD - Currently stable  DVT Prophylaxis: Heparin drip  Code Status: Full code  Family Communication: Patient alert and oriented x4, discussed with the patient  Disposition:   Consultants:  Cardiology  Procedures:  None  Antibiotics:  None    Subjective:  patient seen and examined, no chest pain, having diarrhea issues due to his IBS   Objective: Weight change:   Intake/Output Summary (Last 24 hours) at 03/12/14 1000 Last data filed at 03/12/14 0923  Gross per 24 hour  Intake   2420 ml  Output   1250 ml  Net   1170 ml   Blood pressure 134/63, pulse 71, temperature 98.6 F (37 C), temperature source Oral, resp. rate 20, height 5\' 7"  (1.702 m), weight 56.7 kg (125 lb), SpO2 98.00%.  Physical Exam: General: Alert and awake, oriented x3, not in any acute distress. CVS: S1-S2 clear, no murmur rubs or gallops Chest: clear to auscultation bilaterally, no wheezing, rales or rhonchi Abdomen: soft nontender, nondistended, normal bowel sounds  Extremities: no  cyanosis, clubbing or edema noted bilaterally   Lab Results: Basic Metabolic Panel:  Recent Labs Lab 03/11/14 0125 03/12/14 0355  NA 136* 139  K 4.3 4.4  CL 100 104  CO2 24 23  GLUCOSE 80 175*  BUN 39* 25*  CREATININE 1.32 0.96  CALCIUM 7.8* 8.1*   Liver Function Tests:  Recent Labs Lab 03/10/14 1546  AST 54*  ALT 36  ALKPHOS 70  BILITOT 0.5  PROT 6.0  ALBUMIN 3.3*   No results found for this basename: LIPASE, AMYLASE,  in the last 168 hours No results found for this basename: AMMONIA,  in the last 168 hours CBC:  Recent Labs Lab 03/11/14 0645 03/12/14 0355  WBC 8.8 6.1  HGB 9.9* 10.4*  HCT 29.7* 31.4*  MCV 98.7 101.0*  PLT 161 174   Cardiac Enzymes:  Recent Labs Lab 03/11/14 1024 03/11/14 1605 03/11/14 2208 03/12/14 0801  CKTOTAL 192 227 163  --   CKMB 11.8* 13.7* 12.3*  --   TROPONINI 3.67* 3.26* 2.58* 1.14*   BNP: No components found with this basename: POCBNP,  CBG:  Recent Labs Lab 03/11/14 1138 03/11/14 1512 03/11/14 2034 03/12/14 0015 03/12/14 0816  GLUCAP 257* 294* 232* 169* 191*     Micro Results: Recent Results (from the past 240 hour(s))  MRSA PCR SCREENING     Status: None   Collection Time    03/10/14  8:00 PM      Result Value Ref Range Status   MRSA by PCR NEGATIVE  NEGATIVE Final   Comment:  The GeneXpert MRSA Assay (FDA     approved for NASAL specimens     only), is one component of a     comprehensive MRSA colonization     surveillance program. It is not     intended to diagnose MRSA     infection nor to guide or     monitor treatment for     MRSA infections.    Studies/Results: Dg Chest 2 View  03/10/2014   CLINICAL DATA:  Hyperglycemia. Dizziness. Shortness of breath. Current history of COPD, hypertension and diabetes.  EXAM: CHEST  2 VIEW  COMPARISON:  Portable chest x-ray 08/06/2010. Two-view chest x-ray 11/28/2008. CT chest 08/06/2010.  FINDINGS: Cardiac silhouette mildly to moderately  enlarged but stable. Thoracic aorta atherosclerotic, unchanged. Prominent central pulmonary arteries, unchanged. Chronic elevation of the left hemidiaphragm and chronic scar/atelectasis in the left lower lobe, unchanged. Emphysematous changes in the upper lobes, unchanged. Lungs otherwise clear. No localized airspace consolidation. No pleural effusions. No pneumothorax. Normal pulmonary vascularity. Degenerative changes involving the thoracic spine.  IMPRESSION: COPD/emphysema. Stable cardiomegaly. Stable chronic elevation of the left hemidiaphragm and chronic scar/atelectasis in the left lower lobe. No acute cardiopulmonary disease.   Electronically Signed   By: Hulan Saas M.D.   On: 03/10/2014 16:46   US Aorta  03/11/2014   CLINICAL DATA:  Peripheral arterial disease. Evaluate for abdominal aortic aneurysm.  EXAM: ULTRASOUND OF ABDOMINAL AORTA  TECHNIQUE: Ultrasound examination of the abdominal aorta was performed to evaluate for abdominal aortic aneurysm.  COMPARISON:  One-view abdomen 11/26/2008  FINDINGS: Abdominal Aorta  Diffuse atherosclerotic irregularity is present within the abdominal aorta. There is no focal aneurysm or stenosis.  Maximum AP  Diameter:  2.1 cm  Maximum TRV  Diameter: 2.2 cm  The iliac vessels are of normal size.  IMPRESSION: 1. Diffuse atherosclerotic changes of the abdominal aorta and proximal iliac vessels. 2. No focal aneurysm or stenosis.   Electronically Signed   By: Gennette Pac M.D.   On: 03/11/2014 08:15    Medications: Scheduled Meds: . aspirin EC  81 mg Oral Daily  . atorvastatin  40 mg Oral q1800  . escitalopram  20 mg Oral Daily  . insulin aspart  0-15 Units Subcutaneous TID WC  . insulin aspart  0-5 Units Subcutaneous QHS  . insulin aspart  3 Units Subcutaneous TID WC  . insulin glargine  5 Units Subcutaneous Daily  . levothyroxine  75 mcg Oral QAC breakfast  . methylphenidate  20 mg Oral BID  . metoprolol tartrate  12.5 mg Oral BID  . pantoprazole   40 mg Oral Q1200  . pregabalin  50 mg Oral TID  . vitamin B-12  1,000 mcg Oral Daily      LOS: 2 days   Quang Thorpe M.D. [facility] 03/12/2014, 10:00 AM Pager: [phone]  If 7PM-7AM, please contact night-coverage [email or web address] Password [email or web address]  **Disclaimer: This note was dictated with voice recognition software. Similar sounding words can inadvertently be transcribed and this note may contain transcription errors which may not have been corrected upon publication of note.**

## 2014-03-12 NOTE — Progress Notes (Signed)
ANTICOAGULATION CONSULT NOTE - Follow Up Consult  Pharmacy Consult for heparin Indication: NSTEMI  Labs:  Recent Labs  03/10/14 1546 03/10/14 2105 03/11/14 0005 03/11/14 0125 03/11/14 0645 03/11/14 1024 03/11/14 1605 03/11/14 2208  HGB 10.2* 10.1*  --   --  9.9*  --   --   --   HCT 31.8* 30.9*  --   --  29.7*  --   --   --   PLT 167 160  --   --  161  --   --   --   HEPARINUNFRC  --   --   --   --  0.26*  --  0.45 <0.10*  CREATININE 1.73* 1.48* 1.33 1.32  --   --   --   --   CKTOTAL  --   --   --   --   --  192 227 163  CKMB  --   --   --   --   --  11.8* 13.7* 12.3*  TROPONINI 1.27* 5.28*  --  9.08* 5.87* 3.67* 3.26* 2.58*    Assessment/Plan:  73yo male now undetectable on heparin after one level at goal; RN reports that when she came on shift that IV was out though it had not been reported to her and she does not know how long it was out, IV has been running without interruption since ~1930 but lab was drawn only 2-3hr after resumed.  Will continue for now and check another level with am labs.  Vernard Gambles, PharmD, BCPS  03/12/2014,1:21 AM

## 2014-03-12 NOTE — Progress Notes (Signed)
SUBJECTIVE:  No complaints.  Denies CP  OBJECTIVE:   Vitals:   Filed Vitals:   03/12/14 0900 03/12/14 0950 03/12/14 1044 03/12/14 1134  BP:  134/63 166/101 169/73  Pulse:  71 69 63  Temp:   97.4 F (36.3 C) 97.5 F (36.4 C)  TempSrc:   Oral Oral  Resp: 14 20 14 16   Height:    5\' 7"  (1.702 m)  Weight:      SpO2:  98%  97%   I&O's:   Intake/Output Summary (Last 24 hours) at 03/12/14 1210 Last data filed at 03/12/14 [id number]  Gross per 24 hour  Intake   2130 ml  Output   1050 ml  Net   1080 ml   TELEMETRY: Reviewed telemetry pt in NSR:     PHYSICAL EXAM General: Well developed, well nourished, in no acute distress Head: Eyes PERRLA, No xanthomas.   Normal cephalic and atramatic  Lungs:   Clear bilaterally to auscultation and percussion. Heart:   HRRR S1 S2 Pulses are 2+ & equal. Abdomen: Bowel sounds are positive, abdomen soft and non-tender without masses Extremities:   No clubbing, cyanosis or edema.  DP +1 Neuro: Alert and oriented X 3. Psych:  Good affect, responds appropriately   LABS: Basic Metabolic Panel:  Recent Labs  96/12/5405/05/15 0125 03/12/14 0355  NA 136* 139  K 4.3 4.4  CL 100 104  CO2 24 23  GLUCOSE 80 175*  BUN 39* 25*  CREATININE 1.32 0.96  CALCIUM 7.8* 8.1*   Liver Function Tests:  Recent Labs  03/10/14 1546  AST 54*  ALT 36  ALKPHOS 70  BILITOT 0.5  PROT 6.0  ALBUMIN 3.3*   No results found for this basename: LIPASE, AMYLASE,  in the last 72 hours CBC:  Recent Labs  03/11/14 0645 03/12/14 0355  WBC 8.8 6.1  HGB 9.9* 10.4*  HCT 29.7* 31.4*  MCV 98.7 101.0*  PLT 161 174   Cardiac Enzymes:  Recent Labs  03/11/14 1024 03/11/14 1605 03/11/14 2208 03/12/14 0801  CKTOTAL 192 227 163  --   CKMB 11.8* 13.7* 12.3*  --   TROPONINI 3.67* 3.26* 2.58* 1.14*   BNP: No components found with this basename: POCBNP,  D-Dimer: No results found for this basename: DDIMER,  in the last 72 hours Hemoglobin A1C:  Recent Labs  03/11/14 0645  HGBA1C 6.4*   Fasting Lipid Panel:  Recent Labs  03/11/14 0645  CHOL 105  HDL 43  LDLCALC 46  TRIG 78  CHOLHDL 2.4   Thyroid Function Tests:  Recent Labs  03/10/14 2105  TSH 0.525   Anemia Panel: No results found for this basename: VITAMINB12, FOLATE, FERRITIN, TIBC, IRON, RETICCTPCT,  in the last 72 hours Coag Panel:   Lab Results  Component Value Date   INR 0.89 08/06/2010    RADIOLOGY: Dg Chest 2 View  03/10/2014   CLINICAL DATA:  Hyperglycemia. Dizziness. Shortness of breath. Current history of COPD, hypertension and diabetes.  EXAM: CHEST  2 VIEW  COMPARISON:  Portable chest x-ray 08/06/2010. Two-view chest x-ray 11/28/2008. CT chest 08/06/2010.  FINDINGS: Cardiac silhouette mildly to moderately enlarged but stable. Thoracic aorta atherosclerotic, unchanged. Prominent central pulmonary arteries, unchanged. Chronic elevation of the left hemidiaphragm and chronic scar/atelectasis in the left lower lobe, unchanged. Emphysematous changes in the upper lobes, unchanged. Lungs otherwise clear. No localized airspace consolidation. No pleural effusions. No pneumothorax. Normal pulmonary vascularity. Degenerative changes involving the thoracic spine.  IMPRESSION: COPD/emphysema. Stable cardiomegaly. Stable  chronic elevation of the left hemidiaphragm and chronic scar/atelectasis in the left lower lobe. No acute cardiopulmonary disease.   Electronically Signed   By: Hulan Saas M.D.   On: 03/10/2014 16:46   US Aorta  03/11/2014   CLINICAL DATA:  Peripheral arterial disease. Evaluate for abdominal aortic aneurysm.  EXAM: ULTRASOUND OF ABDOMINAL AORTA  TECHNIQUE: Ultrasound examination of the abdominal aorta was performed to evaluate for abdominal aortic aneurysm.  COMPARISON:  One-view abdomen 11/26/2008  FINDINGS: Abdominal Aorta  Diffuse atherosclerotic irregularity is present within the abdominal aorta. There is no focal aneurysm or stenosis.  Maximum AP  Diameter:  2.1  cm  Maximum TRV  Diameter: 2.2 cm  The iliac vessels are of normal size.  IMPRESSION: 1. Diffuse atherosclerotic changes of the abdominal aorta and proximal iliac vessels. 2. No focal aneurysm or stenosis.   Electronically Signed   By: Gennette Pac M.D.   On: 03/11/2014 08:15    Assessment/Plan:  1 NSTEMI-Continue ASA, heparin, lopressor and statin; plan cath when DKA and renal function improves (probably Tuesday); await echo for LV function  2 DKA-management per primary care  3 Hypertension-continue present meds  4 Hyperlipidemia-continue statin  5 COPD  6 Chronic stage 3 renal failure-improving with hydration; follow BMET  7 PAD-Continue ASA and statin. Carotid dopplers with 1-39% ICA stenosis   Quintella Reichert, MD  03/12/2014  12:10 PM

## 2014-03-12 NOTE — Progress Notes (Signed)
Patient fell getting out of bed without assistance. No injury noted to patient, Patient feet slipped from under him, buttocks hit the floor. Patient denies pain.Patient alert and oriented, follows commands, HR 74, BP 154/68, no distress noted. Patient assisted back to bed, bed alarm initiated. Dr Alvester Morin notified, orders for orthostatics received and performed. Will continue to monitor.

## 2014-03-12 NOTE — Progress Notes (Signed)
Nutrition Brief Note  Patient identified on the Malnutrition Screening Tool (MST) Report for recent weight lost without trying.  Per wt readings, patient has had a 7% weight loss since February 2015; not significant for time frame.  Wt Readings from Last 15 Encounters:  03/10/14 125 lb (56.7 kg)  02/26/14 126 lb (57.153 kg)  02/08/14 124 lb 6.4 oz (56.427 kg)  01/23/14 123 lb 9.6 oz (56.065 kg)  01/15/14 124 lb 3.2 oz (56.337 kg)  01/04/14 127 lb 3.2 oz (57.698 kg)  12/12/13 126 lb 11.2 oz (57.471 kg)  12/05/13 126 lb (57.153 kg)  12/01/13 124 lb 3.2 oz (56.337 kg)  10/26/13 135 lb (61.236 kg)  10/25/13 135 lb 9.6 oz (61.508 kg)  10/18/13 134 lb 9.6 oz (61.054 kg)  09/27/13 135 lb (61.236 kg)  09/15/13 136 lb (61.689 kg)  09/12/13 135 lb (61.236 kg)    Body mass index is 19.57 kg/(m^2). Patient meets criteria for Normal based on current BMI.   Current diet order is Carbohydrate Modified, patient is consuming approximately 75-100% of meals at this time. Labs and medications reviewed.   No nutrition interventions warranted at this time. If nutrition issues arise, please consult RD.   Maureen Chatters, RD, LDN Pager #: [phone] After-Hours Pager #: [phone]

## 2014-03-13 ENCOUNTER — Encounter (HOSPITAL_COMMUNITY)
Admission: EM | Disposition: A | Discharge status: Home / Self Care | Payer: Self-pay | Source: Home / Self Care | Attending: Internal Medicine

## 2014-03-13 DIAGNOSIS — E785 Hyperlipidemia, unspecified: Secondary | ICD-10-CM

## 2014-03-13 DIAGNOSIS — E119 Type 2 diabetes mellitus without complications: Secondary | ICD-10-CM

## 2014-03-13 DIAGNOSIS — I2510 Atherosclerotic heart disease of native coronary artery without angina pectoris: Secondary | ICD-10-CM

## 2014-03-13 HISTORY — PX: LEFT HEART CATHETERIZATION WITH CORONARY ANGIOGRAM: SHX5451

## 2014-03-13 LAB — BASIC METABOLIC PANEL
ANION GAP: 10 (ref 5–15)
BUN: 15 mg/dL (ref 6–23)
CHLORIDE: 103 meq/L (ref 96–112)
CO2: 27 meq/L (ref 19–32)
Calcium: 8.4 mg/dL (ref 8.4–10.5)
Creatinine, Ser: 0.91 mg/dL (ref 0.50–1.35)
GFR calc non Af Amer: 83 mL/min — ABNORMAL LOW (ref >90)
Glucose, Bld: 300 mg/dL — ABNORMAL HIGH (ref 70–99)
POTASSIUM: 5 meq/L (ref 3.7–5.3)
Sodium: 140 mEq/L (ref 137–147)

## 2014-03-13 LAB — CBC
HCT: 30.4 % — ABNORMAL LOW (ref 39.0–52.0)
Hemoglobin: 9.9 g/dL — ABNORMAL LOW (ref 13.0–17.0)
MCH: 32.4 pg (ref 26.0–34.0)
MCHC: 32.6 g/dL (ref 30.0–36.0)
MCV: 99.3 fL (ref 78.0–100.0)
Platelets: 196 10*3/uL (ref 150–400)
RBC: 3.06 MIL/uL — AB (ref 4.22–5.81)
RDW: 13 % (ref 11.5–15.5)
WBC: 6 10*3/uL (ref 4.0–10.5)

## 2014-03-13 LAB — GLUCOSE, CAPILLARY
GLUCOSE-CAPILLARY: 60 mg/dL — AB (ref 70–99)
Glucose-Capillary: 325 mg/dL — ABNORMAL HIGH (ref 70–99)
Glucose-Capillary: 110 mg/dL — ABNORMAL HIGH (ref 70–99)

## 2014-03-13 LAB — HEPARIN LEVEL (UNFRACTIONATED): HEPARIN UNFRACTIONATED: 0.49 [IU]/mL (ref 0.30–0.70)

## 2014-03-13 SURGERY — LEFT HEART CATHETERIZATION WITH CORONARY ANGIOGRAM
Anesthesia: LOCAL

## 2014-03-13 MED ORDER — ACETAMINOPHEN 325 MG PO TABS: 650.0000 mg | ORAL_TABLET | ORAL | Status: DC | PRN

## 2014-03-13 MED ORDER — SODIUM CHLORIDE 0.9 % IV SOLN: 1.0000 mL/kg/h | INTRAVENOUS | Status: DC

## 2014-03-13 MED ORDER — HEPARIN SODIUM (PORCINE) 5000 UNIT/ML IJ SOLN
5000.0000 [IU] | Freq: Three times a day (TID) | INTRAMUSCULAR | Status: DC
  Filled 2014-03-13 (×2): qty 1

## 2014-03-13 MED ORDER — NITROGLYCERIN 0.2 MG/ML ON CALL CATH LAB
INTRAVENOUS | Status: AC
  Filled 2014-03-13: qty 1

## 2014-03-13 MED ORDER — ONDANSETRON HCL 4 MG/2ML IJ SOLN: 4.0000 mg | Freq: Four times a day (QID) | INTRAMUSCULAR | Status: DC | PRN

## 2014-03-13 MED ORDER — MIDAZOLAM HCL 2 MG/2ML IJ SOLN
INTRAMUSCULAR | Status: AC
  Filled 2014-03-13: qty 2

## 2014-03-13 MED ORDER — METOPROLOL TARTRATE 25 MG PO TABS
25.0000 mg | ORAL_TABLET | Freq: Two times a day (BID) | ORAL | Status: DC
  Administered 2014-03-13: 25 mg via ORAL
  Filled 2014-03-13 (×2): qty 1

## 2014-03-13 MED ORDER — FENTANYL CITRATE 0.05 MG/ML IJ SOLN
INTRAMUSCULAR | Status: AC
  Filled 2014-03-13: qty 2

## 2014-03-13 MED ORDER — AMLODIPINE BESYLATE 5 MG PO TABS
5.0000 mg | ORAL_TABLET | Freq: Every day | ORAL | Status: DC
Start: 2014-03-13 — End: 2014-03-13
  Administered 2014-03-13: 5 mg via ORAL
  Filled 2014-03-13: qty 1

## 2014-03-13 MED ORDER — HEPARIN (PORCINE) IN NACL 2-0.9 UNIT/ML-% IJ SOLN
INTRAMUSCULAR | Status: AC
  Filled 2014-03-13: qty 1500

## 2014-03-13 MED ORDER — VERAPAMIL HCL 2.5 MG/ML IV SOLN
INTRAVENOUS | Status: AC
  Filled 2014-03-13: qty 2

## 2014-03-13 MED ORDER — PANTOPRAZOLE SODIUM 40 MG PO TBEC: 40.0000 mg | DELAYED_RELEASE_TABLET | Freq: Every day | ORAL | Status: DC

## 2014-03-13 MED ORDER — CLOPIDOGREL BISULFATE 75 MG PO TABS: 75.0000 mg | ORAL_TABLET | Freq: Every day | ORAL | Status: DC

## 2014-03-13 MED ORDER — LIDOCAINE HCL (PF) 1 % IJ SOLN
INTRAMUSCULAR | Status: AC
Start: 2014-03-13 — End: 2014-03-13
  Filled 2014-03-13: qty 30

## 2014-03-13 MED ORDER — HEPARIN SODIUM (PORCINE) 1000 UNIT/ML IJ SOLN
INTRAMUSCULAR | Status: AC
  Filled 2014-03-13: qty 1

## 2014-03-13 MED ORDER — ATORVASTATIN CALCIUM 40 MG PO TABS: 40.0000 mg | ORAL_TABLET | Freq: Every day | ORAL | Status: DC

## 2014-03-13 NOTE — Progress Notes (Signed)
SUBJECTIVE:  Denies CP or SOB  OBJECTIVE:   Vitals:   Filed Vitals:   03/12/14 1134 03/12/14 1300 03/12/14 2056 03/13/14 0541  BP: 169/73 175/68 140/62 152/62  Pulse: 63 73 63 75  Temp: 97.5 F (36.4 C) 97.2 F (36.2 C) 97.4 F (36.3 C) 98.1 F (36.7 C)  TempSrc: Oral Oral Oral Oral  Resp: 16 16 18 18   Height: 5\' 7"  (1.702 m)     Weight: 126 lb (57.153 kg)   126 lb 1.7 oz (57.2 kg)  SpO2: 97% 94% 96% 96%   I&O's:   Intake/Output Summary (Last 24 hours) at 03/13/14 0902 Last data filed at 03/13/14 0855  Gross per 24 hour  Intake    840 ml  Output   3500 ml  Net  -2660 ml   TELEMETRY: Reviewed telemetry pt in NSR:     PHYSICAL EXAM General: Well developed, well nourished, in no acute distress Head: Eyes PERRLA, No xanthomas.   Normal cephalic and atramatic  Lungs:   Clear bilaterally to auscultation and percussion. Heart:   HRRR S1 S2 Pulses are 2+ & equal. Abdomen: Bowel sounds are positive, abdomen soft and non-tender without masses Extremities:   No clubbing, cyanosis or edema.  DP +1 Neuro: Alert and oriented X 3. Psych:  Good affect, responds appropriately   LABS: Basic Metabolic Panel:  Recent Labs  40/98/1106/02/19 0355 03/12/14 1430  NA 139 143  K 4.4 5.3  CL 104 106  CO2 23 25  GLUCOSE 175* 188*  BUN 25* 18  CREATININE 0.96 0.99  CALCIUM 8.1* 8.6   Liver Function Tests:  Recent Labs  03/10/14 1546  AST 54*  ALT 36  ALKPHOS 70  BILITOT 0.5  PROT 6.0  ALBUMIN 3.3*   No results found for this basename: LIPASE, AMYLASE,  in the last 72 hours CBC:  Recent Labs  03/12/14 1430 03/13/14 0810  WBC 6.0 6.0  HGB 10.3* 9.9*  HCT 31.7* 30.4*  MCV 100.3* 99.3  PLT 198 196   Cardiac Enzymes:  Recent Labs  03/11/14 1024 03/11/14 1605 03/11/14 2208 03/12/14 0801  CKTOTAL 192 227 163  --   CKMB 11.8* 13.7* 12.3*  --   TROPONINI 3.67* 3.26* 2.58* 1.14*   BNP: No components found with this basename: POCBNP,  D-Dimer: No results found for  this basename: DDIMER,  in the last 72 hours Hemoglobin A1C:  Recent Labs  03/11/14 0645  HGBA1C 6.4*   Fasting Lipid Panel:  Recent Labs  03/11/14 0645  CHOL 105  HDL 43  LDLCALC 46  TRIG 78  CHOLHDL 2.4   Thyroid Function Tests:  Recent Labs  03/10/14 2105  TSH 0.525   Anemia Panel: No results found for this basename: VITAMINB12, FOLATE, FERRITIN, TIBC, IRON, RETICCTPCT,  in the last 72 hours Coag Panel:   Lab Results  Component Value Date   INR 0.91 03/12/2014   INR 0.89 08/06/2010    RADIOLOGY: Dg Chest 2 View  03/10/2014   CLINICAL DATA:  Hyperglycemia. Dizziness. Shortness of breath. Current history of COPD, hypertension and diabetes.  EXAM: CHEST  2 VIEW  COMPARISON:  Portable chest x-ray 08/06/2010. Two-view chest x-ray 11/28/2008. CT chest 08/06/2010.  FINDINGS: Cardiac silhouette mildly to moderately enlarged but stable. Thoracic aorta atherosclerotic, unchanged. Prominent central pulmonary arteries, unchanged. Chronic elevation of the left hemidiaphragm and chronic scar/atelectasis in the left lower lobe, unchanged. Emphysematous changes in the upper lobes, unchanged. Lungs otherwise clear. No localized airspace consolidation. No  pleural effusions. No pneumothorax. Normal pulmonary vascularity. Degenerative changes involving the thoracic spine.  IMPRESSION: COPD/emphysema. Stable cardiomegaly. Stable chronic elevation of the left hemidiaphragm and chronic scar/atelectasis in the left lower lobe. No acute cardiopulmonary disease.   Electronically Signed   By: Hulan Saas M.D.   On: 03/10/2014 16:46   US Aorta  03/11/2014   CLINICAL DATA:  Peripheral arterial disease. Evaluate for abdominal aortic aneurysm.  EXAM: ULTRASOUND OF ABDOMINAL AORTA  TECHNIQUE: Ultrasound examination of the abdominal aorta was performed to evaluate for abdominal aortic aneurysm.  COMPARISON:  One-view abdomen 11/26/2008  FINDINGS: Abdominal Aorta  Diffuse atherosclerotic irregularity is  present within the abdominal aorta. There is no focal aneurysm or stenosis.  Maximum AP  Diameter:  2.1 cm  Maximum TRV  Diameter: 2.2 cm  The iliac vessels are of normal size.  IMPRESSION: 1. Diffuse atherosclerotic changes of the abdominal aorta and proximal iliac vessels. 2. No focal aneurysm or stenosis.   Electronically Signed   By: Gennette Pac M.D.   On: 03/11/2014 08:15   Assessment/Plan:  1 NSTEMI-Continue ASA, heparin, lopressor and statin; plan cath today.   2 DKA-management per primary care  3 Hypertension-borderline control.  Increase Metoprolol to 25mg  BID 4 Hyperlipidemia-continue statin  5 COPD  6 Chronic stage 3 renal failure-improving with hydration; follow BMET  7 PAD-Continue ASA and statin. Carotid dopplers with 1-39% ICA stenosis   Quintella Reichert, MD  03/13/2014  9:02 AM

## 2014-03-13 NOTE — Discharge Instructions (Signed)
Radial Site Care °Refer to this sheet in the next few weeks. These instructions provide you with information on caring for yourself after your procedure. Your caregiver may also give you more specific instructions. Your treatment has been planned according to current medical practices, but problems sometimes occur. Call your caregiver if you have any problems or questions after your procedure. °HOME CARE INSTRUCTIONS °· You may shower the day after the procedure. Remove the bandage (dressing) and gently wash the site with plain soap and water. Gently pat the site dry. °· Do not apply powder or lotion to the site. °· Do not submerge the affected site in water for 3 to 5 days. °· Inspect the site at least twice daily. °· Do not flex or bend the affected arm for 24 hours. °· No lifting over 5 pounds (2.3 kg) for 5 days after your procedure. °· Do not drive home if you are discharged the same day of the procedure. Have someone else drive you. °· You may drive 24 hours after the procedure unless otherwise instructed by your caregiver. °· Do not operate machinery or power tools for 24 hours. °· A responsible adult should be with you for the first 24 hours after you arrive home. °What to expect: °· Any bruising will usually fade within 1 to 2 weeks. °· Blood that collects in the tissue (hematoma) may be painful to the touch. It should usually decrease in size and tenderness within 1 to 2 weeks. °SEEK IMMEDIATE MEDICAL CARE IF: °· You have unusual pain at the radial site. °· You have redness, warmth, swelling, or pain at the radial site. °· You have drainage (other than a small amount of blood on the dressing). °· You have chills. °· You have a fever or persistent symptoms for more than 72 hours. °· You have a fever and your symptoms suddenly get worse. °· Your arm becomes pale, cool, tingly, or numb. °· You have heavy bleeding from the site. Hold pressure on the site. °Document Released: 09/26/2010 Document Revised:  11/16/2011 Document Reviewed: 09/26/2010 °[employer]® Patient Information ©2015 [employer]. This information is not intended to replace advice given to you by your health care provider. Make sure you discuss any questions you have with your health care provider. ° °

## 2014-03-13 NOTE — Discharge Summary (Signed)
Physician Discharge Summary  Patient ID: Benjamin ProfferRobert W Fullam MRN: [id number] DOB/AGE: [birth year] 73 y.o.  Admit date: 03/10/2014 Discharge date: 03/13/2014  Primary Care Physician:  Bufford SpikesEED, [name], DO  Discharge Diagnoses:    . DKA (diabetic ketoacidoses) resolved  . Non-STEMI (non-ST elevated myocardial infarction) three-vessel coronary artery disease   . HYPOTHYROIDISM NOS . HYPERTENSION . COPD . Chronic renal failure, stage 3 (moderate) . Dyslipidemia  Consults:  Cardiology, Dr. Gala RomneyBensimhon   Recommendations for Outpatient Follow-up:  The patient is recommended aggressive medical therapy at this time, he has three-vessel obstructive cardiac disease with normal LV function, options for intervention if he develops significant symptoms on good medical therapy  Per Dr SwazilandJordan: [facility] and if abnormal consider CABG if he becomes symptomatic  Allergies:   Allergies  Allergen Reactions  . Ace Inhibitors Other (See Comments)    dizzy  . Angiotensin Receptor Blockers Other (See Comments)    unknown  . Bee Venom Swelling  . Cymbalta [Duloxetine Hcl] Other (See Comments)    dizzy     Discharge Medications:   Medication List         aspirin 81 MG tablet  Take 81 mg by mouth daily.     atorvastatin 40 MG tablet  Commonly known as:  LIPITOR  Take 1 tablet (40 mg total) by mouth at bedtime.     clopidogrel 75 MG tablet  Commonly known as:  PLAVIX  Take 1 tablet (75 mg total) by mouth daily with breakfast.  Start taking on:  03/14/2014     diphenoxylate-atropine 2.5-0.025 MG per tablet  Commonly known as:  LOMOTIL  Take 2 tablets by mouth 3 (three) times daily as needed for diarrhea or loose stools.     escitalopram 20 MG tablet  Commonly known as:  LEXAPRO  Take 1 tablet (20 mg total) by mouth daily.     gabapentin 600 MG tablet  Commonly known as:  NEURONTIN  Take 600 mg by mouth at bedtime.     glucose blood test strip  1 each by Other route See admin  instructions. Check blood sugar 3-4 times daily.     HYDROcodone-acetaminophen 5-325 MG per tablet  Commonly known as:  NORCO/VICODIN  Take 1 tablet by mouth 5 (five) times daily as needed for moderate pain.     Insulin Glargine 100 UNIT/ML Solostar Pen  Commonly known as:  LANTUS  Take 6 units in the morning     insulin lispro 100 UNIT/ML KiwkPen  Commonly known as:  HUMALOG  Inject 1-6 Units into the skin daily as needed (after meals).     Insulin NPH (Human) (Isophane) 100 UNIT/ML Kiwkpen  Commonly known as:  HUMULIN N  Inject 3 Units into the skin every evening.     levothyroxine 75 MCG tablet  Commonly known as:  SYNTHROID, LEVOTHROID  Take 75 mcg by mouth daily before breakfast.     methylphenidate 20 MG tablet  Commonly known as:  RITALIN  Take 1 tablet (20 mg total) by mouth 2 (two) times daily.     metoprolol tartrate 25 MG tablet  Commonly known as:  LOPRESSOR  Take 25 mg by mouth 2 (two) times daily.     pantoprazole 40 MG tablet  Commonly known as:  PROTONIX  Take 1 tablet (40 mg total) by mouth daily.     pregabalin 50 MG capsule  Commonly known as:  LYRICA  Take 50-100 mg by mouth at bedtime.  testosterone cypionate 200 MG/ML injection  Commonly known as:  DEPOTESTOTERONE CYPIONATE  Inject 200 mg into the muscle every 14 (fourteen) days.     vitamin B-12 1000 MCG tablet  Commonly known as:  CYANOCOBALAMIN  Take 1,000 mcg by mouth daily.         Brief H and P: For complete details please refer to admission H and P, but in briefRobert W Savant is a 73 y.o. male with a past medical history significant for hypertension, anxiety/depression, GERD, COPD, IBS, type 1 diabetes (last A1c 7.6); chronic kidney disease stage II-III; came to the hospital secondary to hyperglycemia and not feeling well. The patient reported that he went away to [facility] for July 4th and forgot to take any of his insulin. At the time that he realized that he had forgot his  medication and drove back, his glucometer was just reading high and he was feeling bad. In the emergency department patient was found to be mildly tachypneic and initially with oxygen saturation of 89% on room air. He denied being short of breath at that moment. CBGs 1000 on arrival; patient also found to have acute on chronic renal failure with a creatinine of 1.7; elevated troponin 1.27 and some new changes in the T T waves and ST depressions on his EKG. [facility] has been consulted to admit the patient for further evaluation and treatment.   Hospital Course:   Non-STEMI (non-ST elevated myocardial infarction) with three-vessel obstructive coronary artery disease. Patient was admitted to step down unit. Troponins peaked at 9.08 and trended down to 1.14. Patient was placed on nitroglycerin and heparin drip. Cardiology was consulted. 2-D echo showed EF of 55-60%, mild mitral regurgitation, no PFO. Patient underwent cardiac cath on 03/13/14, delayed initially due to acute renal insufficiency. Cardiac cath showed 3 vessel obstructive coronary artery disease. Per Dr. Swaziland on the Report- "while the RCA lesion is suitable for PCI, his other disease is not as favorable. I would recommend aggressive medical therapy for now. If he develops significant symptoms on good medical therapy would consider options for intervention. May need to [facility] the LAD and if abnormal consider CABG if he becomes symptomatic".  He will followup with cardiology Continue aspirin, Plavix, statin, beta blocker.  - Carotid Dopplers showed 1-39% ICA stenosis   DKA (diabetic ketoacidoses) with uncontrolled diabetes mellitus : DKA has resolved , Likely precipitated due to his noncompliance and forgetting his insulin. - Hemoglobin A1c 6.4, continue carb modified diet . He will follow up with his endocrinologist, Dr. Lucianne Muss  Acute renal insufficiency: Creatinine 1.7 at the time of admission, currently resolved - Likely worsened due to  dehydration, DKA   Diarrhea: Per patient chronic due to IBS : C. difficile was negative  HYPOTHYROIDISM NOS  - Continue Synthroid, TSH 0.525  HYPERTENSION  - Currently stable  COPD  - Currently stable  Coronary angiography:  Coronary dominance: right  Left mainstem: Normal  Left anterior descending (LAD): The LAD has diffuse 40-50% stenosis in the proximal vessel. There is a small first diagonal with 90% stenosis in the mid vessel. The proximal diagonal is diffusely diseased.  Left circumflex (LCx): The LCx gives rise to a single OM branch and then continues in the AV groove. There is 70% disease in the LCx at the OM bifurcation. The first OM has segmental 80% stenosis from the origin to the proximal vessel.  Right coronary artery (RCA): The RCA has moderate calcification. There is an eccentric 80-90% stenosis in the  mid vessel.  Left ventriculography: Left ventricular systolic function is normal, LVEF is estimated at 55-65%, there is no significant mitral regurgitation  Final Conclusions:  1. 3 vessel obstructive CAD  2. Normal LV function.  Recommendations: The patient denies any prior symptoms of chest pain or dyspnea. While the RCA lesion is suitable for PCI, his other disease is not as favorable. I would recommend aggressive medical therapy for now. If he develops significant symptoms on good medical therapy would consider options for intervention. May need to [facility] the LAD and if abnormal consider CABG if he becomes symptomatic.     Day of Discharge BP 158/73  Pulse 96  Temp(Src) 97.3 F (36.3 C) (Oral)  Resp 18  Ht 5\' 7"  (1.702 m)  Wt 57.2 kg (126 lb 1.7 oz)  BMI 19.75 kg/m2  SpO2 98%  Physical Exam: General: Alert and awake oriented x3 not in any acute distress. HEENT: anicteric sclera, pupils reactive to light and accommodation CVS: S1-S2 clear no murmur rubs or gallops Chest: clear to auscultation bilaterally, no wheezing rales or rhonchi Abdomen: soft nontender,  nondistended, normal bowel sounds Extremities: no cyanosis, clubbing or edema noted bilaterally Neuro: Cranial nerves II-XII intact, no focal neurological deficits   The results of significant diagnostics from this hospitalization (including imaging, microbiology, ancillary and laboratory) are listed below for reference.    LAB RESULTS: Basic Metabolic Panel:  Recent Labs Lab 03/12/14 1430 03/13/14 0810  NA 143 140  K 5.3 5.0  CL 106 103  CO2 25 27  GLUCOSE 188* 300*  BUN 18 15  CREATININE 0.99 0.91  CALCIUM 8.6 8.4   Liver Function Tests:  Recent Labs Lab 03/10/14 1546  AST 54*  ALT 36  ALKPHOS 70  BILITOT 0.5  PROT 6.0  ALBUMIN 3.3*   No results found for this basename: LIPASE, AMYLASE,  in the last 168 hours No results found for this basename: AMMONIA,  in the last 168 hours CBC:  Recent Labs Lab 03/12/14 1430 03/13/14 0810  WBC 6.0 6.0  HGB 10.3* 9.9*  HCT 31.7* 30.4*  MCV 100.3* 99.3  PLT 198 196   Cardiac Enzymes:  Recent Labs Lab 03/11/14 1605 03/11/14 2208 03/12/14 0801  CKTOTAL 227 163  --   CKMB 13.7* 12.3*  --   TROPONINI 3.26* 2.58* 1.14*   BNP: No components found with this basename: POCBNP,  CBG:  Recent Labs Lab 03/13/14 1121 03/13/14 1203  GLUCAP 60* 110*    Significant Diagnostic Studies:  Dg Chest 2 View  03/10/2014   CLINICAL DATA:  Hyperglycemia. Dizziness. Shortness of breath. Current history of COPD, hypertension and diabetes.  EXAM: CHEST  2 VIEW  COMPARISON:  Portable chest x-ray 08/06/2010. Two-view chest x-ray 11/28/2008. CT chest 08/06/2010.  FINDINGS: Cardiac silhouette mildly to moderately enlarged but stable. Thoracic aorta atherosclerotic, unchanged. Prominent central pulmonary arteries, unchanged. Chronic elevation of the left hemidiaphragm and chronic scar/atelectasis in the left lower lobe, unchanged. Emphysematous changes in the upper lobes, unchanged. Lungs otherwise clear. No localized airspace consolidation.  No pleural effusions. No pneumothorax. Normal pulmonary vascularity. Degenerative changes involving the thoracic spine.  IMPRESSION: COPD/emphysema. Stable cardiomegaly. Stable chronic elevation of the left hemidiaphragm and chronic scar/atelectasis in the left lower lobe. No acute cardiopulmonary disease.   Electronically Signed   By: Hulan Saas M.D.   On: 03/10/2014 16:46   US Aorta  03/11/2014   CLINICAL DATA:  Peripheral arterial disease. Evaluate for abdominal aortic aneurysm.  EXAM: ULTRASOUND OF ABDOMINAL  AORTA  TECHNIQUE: Ultrasound examination of the abdominal aorta was performed to evaluate for abdominal aortic aneurysm.  COMPARISON:  One-view abdomen 11/26/2008  FINDINGS: Abdominal Aorta  Diffuse atherosclerotic irregularity is present within the abdominal aorta. There is no focal aneurysm or stenosis.  Maximum AP  Diameter:  2.1 cm  Maximum TRV  Diameter: 2.2 cm  The iliac vessels are of normal size.  IMPRESSION: 1. Diffuse atherosclerotic changes of the abdominal aorta and proximal iliac vessels. 2. No focal aneurysm or stenosis.   Electronically Signed   By: Gennette Pac M.D.   On: 03/11/2014 08:15    2D ECHO: Study Conclusions  - Left ventricle: Systolic function was normal. The estimated ejection fraction was in the range of 55% to 60%. - Ventricular septum: Elevated EDP by E/E&' - Mitral valve: There was mild regurgitation. - Left atrium: The atrium was mildly dilated. - Atrial septum: No defect or patent foramen ovale was identified.    Disposition and Follow-up: Discharge Instructions   Diet Carb Modified    Complete by:  As directed      Increase activity slowly    Complete by:  As directed             DISPOSITION: Home DIET: Carb modified diet     DISCHARGE FOLLOW-UP Follow-up Information   Follow up with [name], [name], DO. Schedule an appointment as soon as possible for a visit in 2 weeks. (for hospital follow-up)    Specialty:  Geriatric Medicine    Contact information:   [address] [place] [address] [phone]       Follow up with Abelino Derrick, PA-C On 04/03/2014. (8:30 am)    Specialty:  Cardiology   Contact information:   [address] [place] [address] [phone]       Time spent on Discharge: 40 minutes   Signed:   Amely Voorheis M.D. [facility] 03/13/2014, 2:12 PM Pager: [phone]   **Disclaimer: This note was dictated with voice recognition software. Similar sounding words can inadvertently be transcribed and this note may contain transcription errors which may not have been corrected upon publication of note.**

## 2014-03-13 NOTE — CV Procedure (Signed)
    Cardiac Catheterization Procedure Note  Name: Benjamin Hodge MRN: [id number] DOB: Aug 07, 1941  Procedure: Left Heart Cath, Selective Coronary Angiography, LV angiography  Indication: 73 yo WM presents with DKA with positive troponins.    Procedural Details: The right wrist was prepped, draped, and anesthetized with 1% lidocaine. Using the modified Seldinger technique, a 6 French slender sheath was introduced into the right radial artery. 3 mg of verapamil was administered through the sheath, weight-based unfractionated heparin was administered intravenously. Standard Judkins catheters were used for selective coronary angiography and left ventriculography. Catheter exchanges were performed over an exchange length guidewire. There were no immediate procedural complications. A TR band was used for radial hemostasis at the completion of the procedure.  The patient was transferred to the post catheterization recovery area for further monitoring.  Procedural Findings: Hemodynamics: AO 150/69 mean 102 mm Hg LV 147/14 mm Hg  Coronary angiography: Coronary dominance: right  Left mainstem: Normal  Left anterior descending (LAD): The LAD has diffuse 40-50% stenosis in the proximal vessel. There is a small first diagonal with 90% stenosis in the mid vessel. The proximal diagonal is diffusely diseased.   Left circumflex (LCx): The LCx gives rise to a single OM branch and then continues in the AV groove. There is 70% disease in the LCx at the OM bifurcation. The first OM has segmental 80% stenosis from the origin to the proximal vessel.   Right coronary artery (RCA): The RCA has moderate calcification. There is an eccentric 80-90% stenosis in the mid vessel.   Left ventriculography: Left ventricular systolic function is normal, LVEF is estimated at 55-65%, there is no significant mitral regurgitation   Final Conclusions:   1. 3 vessel obstructive CAD 2. Normal LV  function.  Recommendations: The patient denies any prior symptoms of chest pain or dyspnea. While the RCA lesion is suitable for PCI, his other disease is not as favorable. I would recommend aggressive medical therapy for now. If he develops significant symptoms on good medical therapy would consider options for intervention. May need to [facility] the LAD and if abnormal consider CABG if he becomes symptomatic.   Peter Swaziland, MDFACC  03/13/2014, 10:53 AM

## 2014-03-13 NOTE — Interval H&P Note (Signed)
History and Physical Interval Note:  03/13/2014 10:22 AM  Benjamin Hodge  has presented today for surgery, with the diagnosis of cp  The various methods of treatment have been discussed with the patient and family. After consideration of risks, benefits and other options for treatment, the patient has consented to  Procedure(s): LEFT HEART CATHETERIZATION WITH CORONARY ANGIOGRAM ([name]) as a surgical intervention .  The patient's history has been reviewed, patient examined, no change in status, stable for surgery.  I have reviewed the patient's chart and labs.  Questions were answered to the patient's satisfaction.   Cath Lab Visit (complete for each Cath Lab visit)  Clinical Evaluation Leading to the Procedure:   ACS: Yes.    Non-ACS:    Anginal Classification: CCS II  Anti-ischemic medical therapy: Minimal Therapy (1 class of medications)  Non-Invasive Test Results: No non-invasive testing performed  Prior CABG: No previous CABG        Theron Arista [facility] 03/13/2014 10:22 AM

## 2014-03-13 NOTE — Progress Notes (Signed)
Hypoglycemic Event  CBG: 60  Treatment: 15 GM carbohydrate snack  Symptoms: None  Follow-up CBG: Time:1200 CBG Result:110  Possible Reasons for Event: Inadequate meal intake  Comments/MD notified:Text paged    [name]  Remember to initiate Hypoglycemia Order Set & complete

## 2014-03-13 NOTE — H&P (View-Only) (Signed)
SUBJECTIVE:  Denies CP or SOB  OBJECTIVE:   Vitals:   Filed Vitals:   03/12/14 1134 03/12/14 1300 03/12/14 2056 03/13/14 0541  BP: 169/73 175/68 140/62 152/62  Pulse: 63 73 63 75  Temp: 97.5 F (36.4 C) 97.2 F (36.2 C) 97.4 F (36.3 C) 98.1 F (36.7 C)  TempSrc: Oral Oral Oral Oral  Resp: 16 16 18 18  Height: 5' 7" (1.702 m)     Weight: 126 lb (57.153 kg)   126 lb 1.7 oz (57.2 kg)  SpO2: 97% 94% 96% 96%   I&O's:   Intake/Output Summary (Last 24 hours) at 03/13/14 0902 Last data filed at 03/13/14 0855  Gross per 24 hour  Intake    840 ml  Output   3500 ml  Net  -2660 ml   TELEMETRY: Reviewed telemetry pt in NSR:     PHYSICAL EXAM General: Well developed, well nourished, in no acute distress Head: Eyes PERRLA, No xanthomas.   Normal cephalic and atramatic  Lungs:   Clear bilaterally to auscultation and percussion. Heart:   HRRR S1 S2 Pulses are 2+ & equal. Abdomen: Bowel sounds are positive, abdomen soft and non-tender without masses Extremities:   No clubbing, cyanosis or edema.  DP +1 Neuro: Alert and oriented X 3. Psych:  Good affect, responds appropriately   LABS: Basic Metabolic Panel:  Recent Labs  03/12/14 0355 03/12/14 1430  NA 139 143  K 4.4 5.3  CL 104 106  CO2 23 25  GLUCOSE 175* 188*  BUN 25* 18  CREATININE 0.96 0.99  CALCIUM 8.1* 8.6   Liver Function Tests:  Recent Labs  03/10/14 1546  AST 54*  ALT 36  ALKPHOS 70  BILITOT 0.5  PROT 6.0  ALBUMIN 3.3*   No results found for this basename: LIPASE, AMYLASE,  in the last 72 hours CBC:  Recent Labs  03/12/14 1430 03/13/14 0810  WBC 6.0 6.0  HGB 10.3* 9.9*  HCT 31.7* 30.4*  MCV 100.3* 99.3  PLT 198 196   Cardiac Enzymes:  Recent Labs  03/11/14 1024 03/11/14 1605 03/11/14 2208 03/12/14 0801  CKTOTAL 192 227 163  --   CKMB 11.8* 13.7* 12.3*  --   TROPONINI 3.67* 3.26* 2.58* 1.14*   BNP: No components found with this basename: POCBNP,  D-Dimer: No results found for  this basename: DDIMER,  in the last 72 hours Hemoglobin A1C:  Recent Labs  03/11/14 0645  HGBA1C 6.4*   Fasting Lipid Panel:  Recent Labs  03/11/14 0645  CHOL 105  HDL 43  LDLCALC 46  TRIG 78  CHOLHDL 2.4   Thyroid Function Tests:  Recent Labs  03/10/14 2105  TSH 0.525   Anemia Panel: No results found for this basename: VITAMINB12, FOLATE, FERRITIN, TIBC, IRON, RETICCTPCT,  in the last 72 hours Coag Panel:   Lab Results  Component Value Date   INR 0.91 03/12/2014   INR 0.89 08/06/2010    RADIOLOGY: Dg Chest 2 View  03/10/2014   CLINICAL DATA:  Hyperglycemia. Dizziness. Shortness of breath. Current history of COPD, hypertension and diabetes.  EXAM: CHEST  2 VIEW  COMPARISON:  Portable chest x-ray 08/06/2010. Two-view chest x-ray 11/28/2008. CT chest 08/06/2010.  FINDINGS: Cardiac silhouette mildly to moderately enlarged but stable. Thoracic aorta atherosclerotic, unchanged. Prominent central pulmonary arteries, unchanged. Chronic elevation of the left hemidiaphragm and chronic scar/atelectasis in the left lower lobe, unchanged. Emphysematous changes in the upper lobes, unchanged. Lungs otherwise clear. No localized airspace consolidation. No   pleural effusions. No pneumothorax. Normal pulmonary vascularity. Degenerative changes involving the thoracic spine.  IMPRESSION: COPD/emphysema. Stable cardiomegaly. Stable chronic elevation of the left hemidiaphragm and chronic scar/atelectasis in the left lower lobe. No acute cardiopulmonary disease.   Electronically Signed   By: Hulan Saas M.D.   On: 03/10/2014 16:46   US Aorta  03/11/2014   CLINICAL DATA:  Peripheral arterial disease. Evaluate for abdominal aortic aneurysm.  EXAM: ULTRASOUND OF ABDOMINAL AORTA  TECHNIQUE: Ultrasound examination of the abdominal aorta was performed to evaluate for abdominal aortic aneurysm.  COMPARISON:  One-view abdomen 11/26/2008  FINDINGS: Abdominal Aorta  Diffuse atherosclerotic irregularity is  present within the abdominal aorta. There is no focal aneurysm or stenosis.  Maximum AP  Diameter:  2.1 cm  Maximum TRV  Diameter: 2.2 cm  The iliac vessels are of normal size.  IMPRESSION: 1. Diffuse atherosclerotic changes of the abdominal aorta and proximal iliac vessels. 2. No focal aneurysm or stenosis.   Electronically Signed   By: Gennette Pac M.D.   On: 03/11/2014 08:15   Assessment/Plan:  1 NSTEMI-Continue ASA, heparin, lopressor and statin; plan cath today.   2 DKA-management per primary care  3 Hypertension-borderline control.  Increase Metoprolol to 25mg  BID 4 Hyperlipidemia-continue statin  5 COPD  6 Chronic stage 3 renal failure-improving with hydration; follow BMET  7 PAD-Continue ASA and statin. Carotid dopplers with 1-39% ICA stenosis   Quintella Reichert, MD  03/13/2014  9:02 AM

## 2014-03-13 NOTE — Progress Notes (Signed)
Inpatient Diabetes Program Recommendations  AACE/ADA: New Consensus Statement on Inpatient Glycemic Control (2013)  Target Ranges:  Prepandial:   less than 140 mg/dL      Peak postprandial:   less than 180 mg/dL (1-2 hours)      Critically ill patients:  140 - 180 mg/dL   Reason for Visit: Hyperglycemia and Hypoglycemia  Results for Benjamin Hodge, Benjamin Hodge (MRN [id number]) as of 03/13/2014 12:52  Ref. Range 03/12/2014 16:58 03/12/2014 21:14 03/13/2014 07:31 03/13/2014 11:21 03/13/2014 12:03  Glucose-Capillary Latest Range: 70-99 mg/dL 657 (H) 96 903 (H) 60 (L) 110 (H)   Hypoglycemia likely d/t too much correction insulin. Hyperglycemia in am likely d/t not enough basal insulin.  Inpatient Diabetes Program Recommendations Insulin - Basal: Increase Lantus to 7 units QAM. Correction (SSI): Decrease Novolog to sensitive tidwc and hs  Note: Will follow-up. Thank you. Ailene Ards, RD, LDN, CDE Inpatient Diabetes Coordinator [phone]

## 2014-03-19 ENCOUNTER — Other Ambulatory Visit: Payer: Medicare Other

## 2014-03-22 ENCOUNTER — Ambulatory Visit: Payer: Medicare Other | Admitting: Endocrinology

## 2014-03-28 ENCOUNTER — Encounter: Payer: Self-pay | Admitting: Internal Medicine

## 2014-03-28 ENCOUNTER — Ambulatory Visit (INDEPENDENT_AMBULATORY_CARE_PROVIDER_SITE_OTHER): Payer: Medicare Other | Admitting: Internal Medicine

## 2014-03-28 ENCOUNTER — Ambulatory Visit: Payer: Medicare Other | Admitting: Internal Medicine

## 2014-03-28 VITALS — Wt 123.0 lb

## 2014-03-28 DIAGNOSIS — I10 Essential (primary) hypertension: Secondary | ICD-10-CM

## 2014-03-28 DIAGNOSIS — K589 Irritable bowel syndrome without diarrhea: Secondary | ICD-10-CM

## 2014-03-28 DIAGNOSIS — E1010 Type 1 diabetes mellitus with ketoacidosis without coma: Secondary | ICD-10-CM

## 2014-03-28 DIAGNOSIS — E785 Hyperlipidemia, unspecified: Secondary | ICD-10-CM

## 2014-03-28 DIAGNOSIS — E1049 Type 1 diabetes mellitus with other diabetic neurological complication: Secondary | ICD-10-CM

## 2014-03-28 DIAGNOSIS — E1065 Type 1 diabetes mellitus with hyperglycemia: Secondary | ICD-10-CM

## 2014-03-28 DIAGNOSIS — G4710 Hypersomnia, unspecified: Secondary | ICD-10-CM

## 2014-03-28 DIAGNOSIS — I2510 Atherosclerotic heart disease of native coronary artery without angina pectoris: Secondary | ICD-10-CM | POA: Insufficient documentation

## 2014-03-28 NOTE — Progress Notes (Signed)
Patient ID: Benjamin Hodge, male   DOB: 07/29/1941, 73 y.o.   MRN: [id number]    Location:    [facility]  Place of Service:  OFFICE    Allergies  Allergen Reactions  . Ace Inhibitors Other (See Comments)    dizzy  . Angiotensin Receptor Blockers Other (See Comments)    unknown  . Bee Venom Swelling  . Cymbalta [Duloxetine Hcl] Other (See Comments)    dizzy    Chief Complaint  Patient presents with  . Hospitalization Follow-up    Patient was seen in the hospital for BS and Heart related concerns.  . Weight Loss    Patient with weight loss, weighed 126 on 03/13/14- now 123   . Arm Pain    Left arm pain (at the bend of arm) onset's while patient is asleep     HPI:  Hospitalized 03/10/14 to 03/13/14 for DKA. Glucose was >1000. He is a recognized brittle diabetic. Dr. Dwyane Dee is his endocrinologist. He is working with this patient to obtain and insulin pump. Likely precipitated due to his noncompliance and forgetting his insulin. - Hemoglobin A1c 6.4, continue carb modified diet . He will follow up with his endocrinologist, Dr. Dwyane Dee   Since leaving the hospital, he has had some pain in the bend of the left arm for the last several days. Sometimes he wakes up with this pain. He was worried that it could be related to a heart problem. When hospitalized, he had heart cath and was thought to have MI.  Non-STEMI (non-ST elevated myocardial infarction) with three-vessel obstructive coronary artery disease. Patient was admitted to step down unit. Troponins peaked at 9.08 and trended down to 1.14. Patient was placed on nitroglycerin and heparin drip. Cardiology was consulted. 2-D echo showed EF of 55-60%, mild mitral regurgitation, no PFO. Patient underwent cardiac cath on 03/13/14, delayed initially due to acute renal insufficiency. Cardiac cath showed 3 vessel obstructive coronary artery disease. Per Dr. Martinique on the Report- "while the RCA lesion is suitable for PCI, his other disease is not as  favorable. I would recommend aggressive medical therapy for now. If he develops significant symptoms on good medical therapy would consider options for intervention. May need to [facility] the LAD and if abnormal consider CABG if he becomes symptomatic".   He will followup with cardiology Continue aspirin, Plavix, statin, beta blocker.   - Carotid Dopplers showed 1-39% ICA stenosis   Acute renal insufficiency: Creatinine 1.7 at the time of admission, currently resolved - Likely worsened due to dehydration, DKA    Coronary angiography:  Coronary dominance: right   Left mainstem: Normal   Left anterior descending (LAD): The LAD has diffuse 40-50% stenosis in the proximal vessel. There is a small first diagonal with 90% stenosis in the mid vessel. The proximal diagonal is diffusely diseased.   Left circumflex (LCx): The LCx gives rise to a single OM branch and then continues in the AV groove. There is 70% disease in the LCx at the OM bifurcation. The first OM has segmental 80% stenosis from the origin to the proximal vessel.   Right coronary artery (RCA): The RCA has moderate calcification. There is an eccentric 80-90% stenosis in the mid vessel.   Left ventriculography: Left ventricular systolic function is normal, LVEF is estimated at 55-65%, there is no significant mitral regurgitation   Final Conclusions:   1. 3 vessel obstructive CAD   2. Normal LV function.   Recommendations: The patient denies any prior symptoms of  chest pain or dyspnea. While the RCA lesion is suitable for PCI, his other disease is not as favorable. I would recommend aggressive medical therapy for now. If he develops significant symptoms on good medical therapy would consider options for intervention. May need to [facility] the LAD and if abnormal consider CABG if he becomes symptomatic.   He has chronic diarrhea thought secondary to IBS. He sees Dr. Henrene Pastor for this. He uses Imodium chronically to help control this  problem./  Medications: Patient's Medications  New Prescriptions   No medications on file  Previous Medications   ASPIRIN 81 MG TABLET    Take 81 mg by mouth daily.    DIPHENOXYLATE-ATROPINE (LOMOTIL) 2.5-0.025 MG PER TABLET    Take 2 tablets by mouth 3 (three) times daily as needed for diarrhea or loose stools.   GABAPENTIN (NEURONTIN) 600 MG TABLET    600 mg. 1/2 by mouth daily   GLUCOSE BLOOD TEST STRIP    1 each by Other route See admin instructions. Check blood sugar 3-4 times daily.   HYDROCODONE-ACETAMINOPHEN (NORCO/VICODIN) 5-325 MG PER TABLET    Take 1 tablet by mouth 5 (five) times daily as needed for moderate pain.   INSULIN GLARGINE (LANTUS) 100 UNIT/ML SOLOSTAR PEN    Take 6 units in the morning   INSULIN ISOPHANE HUMAN (HUMULIN N) 100 UNIT/ML KIWKPEN    Inject 3 Units into the skin every evening.    INSULIN LISPRO (HUMALOG) 100 UNIT/ML KIWKPEN    Inject 1-6 Units into the skin daily as needed (after meals).    LEVOTHYROXINE (SYNTHROID, LEVOTHROID) 75 MCG TABLET    Take 75 mcg by mouth daily before breakfast. For Hypothyroidism   MELATONIN 3 MG TABS    Take by mouth at bedtime. For Sleep   METHYLPHENIDATE (RITALIN) 20 MG TABLET    Take 1 tablet (20 mg total) by mouth 2 (two) times daily.   METOPROLOL TARTRATE (LOPRESSOR) 25 MG TABLET    Take 25 mg by mouth 2 (two) times daily.   PANTOPRAZOLE (PROTONIX) 40 MG TABLET    Take 1 tablet (40 mg total) by mouth daily.   PREGABALIN (LYRICA) 50 MG CAPSULE    Take 50-100 mg by mouth at bedtime.   TESTOSTERONE CYPIONATE (DEPOTESTOTERONE CYPIONATE) 200 MG/ML INJECTION    Inject 200 mg into the muscle every 14 (fourteen) days.    VITAMIN B-12 (CYANOCOBALAMIN) 1000 MCG TABLET    Take 1,000 mcg by mouth daily.  Modified Medications   Modified Medication Previous Medication   ATORVASTATIN (LIPITOR) 40 MG TABLET atorvastatin (LIPITOR) 40 MG tablet      Take 40 mg by mouth at bedtime. For Cholesterol    Take 1 tablet (40 mg total) by mouth at  bedtime.   CLOPIDOGREL (PLAVIX) 75 MG TABLET clopidogrel (PLAVIX) 75 MG tablet      Take 75 mg by mouth daily with breakfast. Blood Thinner    Take 1 tablet (75 mg total) by mouth daily with breakfast.   ESCITALOPRAM (LEXAPRO) 20 MG TABLET escitalopram (LEXAPRO) 20 MG tablet      Take 20 mg by mouth daily. For Depression    Take 1 tablet (20 mg total) by mouth daily.  Discontinued Medications   No medications on file     Review of Systems  Constitutional: Negative for fever, activity change, appetite change, fatigue and unexpected weight change.  HENT: Negative for ear pain, hearing loss, mouth sores, sore throat and tinnitus.   Eyes: Negative.  Respiratory: Negative for cough, choking, shortness of breath and wheezing.   Cardiovascular: Negative for chest pain, palpitations and leg swelling.  Gastrointestinal: Positive for diarrhea.  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  Musculoskeletal:       Tender right foot Pain in the bend of the left arm.  Skin: Negative.   Allergic/Immunologic: Negative.   Neurological: Positive for weakness and numbness. Negative for dizziness, tremors and headaches.  Psychiatric/Behavioral: Negative for hallucinations, behavioral problems, dysphoric mood and agitation.    Filed Vitals:   03/28/14 1608  Weight: 123 lb (55.792 kg)   Body mass index is 19.26 kg/(m^2).  Physical Exam  Constitutional: He is oriented to person, place, and time. He appears well-developed and well-nourished. He appears distressed.  HENT:  Head: Normocephalic.  Right Ear: External ear normal.  Left Ear: External ear normal.  Nose: Nose normal.  Mouth/Throat: Oropharynx is clear and moist. No oropharyngeal exudate.  Eyes: Conjunctivae and EOM are normal. Pupils are equal, round, and reactive to light.  Neck: No JVD present. No tracheal deviation present. No thyromegaly present.  Cardiovascular:  Normal rate, regular rhythm, normal heart sounds and intact distal pulses.  Exam reveals no friction rub.   No murmur heard. Pulmonary/Chest: No respiratory distress. He has no wheezes. He has no rales. He exhibits no tenderness.  Abdominal: He exhibits no distension and no mass. There is no tenderness.  Musculoskeletal: Normal range of motion. He exhibits tenderness. He exhibits no edema.  Lymphadenopathy:    He has no cervical adenopathy.  Neurological: He is alert and oriented to person, place, and time. He has normal reflexes. No cranial nerve deficit. Coordination normal.  Skin: No rash noted. No erythema. No pallor.  Psychiatric: He has a normal mood and affect. His behavior is normal. Thought content normal.     Labs reviewed: Admission on 03/10/2014, Discharged on 03/13/2014  No results displayed because visit has over 200 results.    Appointment on 02/09/2014  Component Date Value Ref Range Status  . Fructosamine 02/09/2014 443* 190 - 270 umol/L Final  . Sodium 02/09/2014 138  135 - 145 mEq/L Final  . Potassium 02/09/2014 5.4* 3.5 - 5.1 mEq/L Final  . Chloride 02/09/2014 107  96 - 112 mEq/L Final  . CO2 02/09/2014 23  19 - 32 mEq/L Final  . Glucose, Bld 02/09/2014 194* 70 - 99 mg/dL Final  . BUN 02/09/2014 22  6 - 23 mg/dL Final  . Creatinine, Ser 02/09/2014 1.0  0.4 - 1.5 mg/dL Final  . Calcium 02/09/2014 8.9  8.4 - 10.5 mg/dL Final  . GFR 02/09/2014 74.82  >60.00 mL/min Final  . Glucose, Bld 02/09/2014 194* 70 - 99 mg/dL Final  . C-Peptide 02/09/2014 0.00* 0.80 - 3.90 ng/mL Final  Office Visit on 01/23/2014  Component Date Value Ref Range Status  . Sodium 01/23/2014 134* 135 - 145 mEq/L Final  . Potassium 01/23/2014 4.9  3.5 - 5.1 mEq/L Final  . Chloride 01/23/2014 98  96 - 112 mEq/L Final  . CO2 01/23/2014 26  19 - 32 mEq/L Final  . Glucose, Bld 01/23/2014 321* 70 - 99 mg/dL Final  . BUN 01/23/2014 40* 6 - 23 mg/dL Final  . Creatinine, Ser 01/23/2014 2.1* 0.4 - 1.5  mg/dL Final  . Calcium 01/23/2014 9.1  8.4 - 10.5 mg/dL Final  . GFR 01/23/2014 34.19* >60.00 mL/min Final  . TSH 01/23/2014 1.43  0.35 -  4.50 uIU/mL Final  Admission on 01/19/2014, Discharged on 01/19/2014  Component Date Value Ref Range Status  . WBC 01/19/2014 8.1  4.0 - 10.5 K/uL Final  . RBC 01/19/2014 3.72* 4.22 - 5.81 MIL/uL Final  . Hemoglobin 01/19/2014 12.6* 13.0 - 17.0 g/dL Final  . HCT 01/19/2014 39.4  39.0 - 52.0 % Final  . MCV 01/19/2014 105.9* 78.0 - 100.0 fL Final  . MCH 01/19/2014 33.9  26.0 - 34.0 pg Final  . MCHC 01/19/2014 32.0  30.0 - 36.0 g/dL Final  . RDW 01/19/2014 13.3  11.5 - 15.5 % Final  . Platelets 01/19/2014 272  150 - 400 K/uL Final  . Neutrophils Relative % 01/19/2014 63  43 - 77 % Final  . Neutro Abs 01/19/2014 5.1  1.7 - 7.7 K/uL Final  . Lymphocytes Relative 01/19/2014 26  12 - 46 % Final  . Lymphs Abs 01/19/2014 2.1  0.7 - 4.0 K/uL Final  . Monocytes Relative 01/19/2014 10  3 - 12 % Final  . Monocytes Absolute 01/19/2014 0.8  0.1 - 1.0 K/uL Final  . Eosinophils Relative 01/19/2014 1  0 - 5 % Final  . Eosinophils Absolute 01/19/2014 0.1  0.0 - 0.7 K/uL Final  . Basophils Relative 01/19/2014 0  0 - 1 % Final  . Basophils Absolute 01/19/2014 0.0  0.0 - 0.1 K/uL Final  . Lactic Acid, Venous 01/19/2014 1.31  0.5 - 2.2 mmol/L Final  . Sodium 01/19/2014 143  137 - 147 mEq/L Final  . Potassium 01/19/2014 4.6  3.7 - 5.3 mEq/L Final  . Chloride 01/19/2014 105  96 - 112 mEq/L Final  . CO2 01/19/2014 25  19 - 32 mEq/L Final  . Glucose, Bld 01/19/2014 24* 70 - 99 mg/dL Final   Comment: CRITICAL RESULT CALLED TO, READ BACK BY AND VERIFIED WITH:                          WHITE E,RN 01/19/14 0214 WAYK  . BUN 01/19/2014 22  6 - 23 mg/dL Final  . Creatinine, Ser 01/19/2014 1.41* 0.50 - 1.35 mg/dL Final  . Calcium 01/19/2014 9.3  8.4 - 10.5 mg/dL Final  . Total Protein 01/19/2014 7.5  6.0 - 8.3 g/dL Final  . Albumin 01/19/2014 4.6  3.5 - 5.2 g/dL Final  . AST  01/19/2014 29  0 - 37 U/L Final  . ALT 01/19/2014 20  0 - 53 U/L Final  . Alkaline Phosphatase 01/19/2014 64  39 - 117 U/L Final  . Total Bilirubin 01/19/2014 0.5  0.3 - 1.2 mg/dL Final  . GFR calc non Af Amer 01/19/2014 49* >90 mL/min Final  . GFR calc Af Amer 01/19/2014 57* >90 mL/min Final   Comment: (NOTE)                          The eGFR has been calculated using the CKD EPI equation.                          This calculation has not been validated in all clinical situations.                          eGFR's persistently <90 mL/min signify possible Chronic Kidney  Disease.  . Troponin I 01/19/2014 <0.30  <0.30 ng/mL Final   Comment:                                 Due to the release kinetics of cTnI,                          a negative result within the first hours                          of the onset of symptoms does not rule out                          myocardial infarction with certainty.                          If myocardial infarction is still suspected,                          repeat the test at appropriate intervals.  . Glucose-Capillary 01/19/2014 105* 70 - 99 mg/dL Final  . Glucose-Capillary 01/19/2014 136* 70 - 99 mg/dL Final  . Glucose-Capillary 01/19/2014 189* 70 - 99 mg/dL Final      Assessment/Plan  1. Atherosclerosis of native coronary artery of native heart without angina pectoris - Ambulatory referral to Cardiology  2. Type 1 diabetes mellitus with ketoacidosis and without coma - Hemoglobin A1c; Future - Comprehensive metabolic panel; Future  3. HYPERTENSION - Comprehensive metabolic panel; Future  4. HYPERSOMNIA No new orders  5. Other and unspecified hyperlipidemia - Lipid panel; Future  6. Type I (juvenile type) diabetes mellitus with neurological manifestations, uncontrolled -see DR. Kumar  7. Irritable bowel syndrome See DR. Henrene Pastor as needed  8. Coronary artery disease involving native coronary artery of native  heart without angina pectoris -cardiology referral

## 2014-03-28 NOTE — Patient Instructions (Signed)
Continuecurrent medicatioins 

## 2014-04-03 ENCOUNTER — Ambulatory Visit (INDEPENDENT_AMBULATORY_CARE_PROVIDER_SITE_OTHER): Payer: Medicare Other | Admitting: Cardiology

## 2014-04-03 ENCOUNTER — Encounter: Payer: Self-pay | Admitting: Cardiology

## 2014-04-03 VITALS — BP 120/60 | HR 80 | Ht 66.0 in | Wt 124.7 lb

## 2014-04-03 DIAGNOSIS — I214 Non-ST elevation (NSTEMI) myocardial infarction: Secondary | ICD-10-CM

## 2014-04-03 DIAGNOSIS — Z79899 Other long term (current) drug therapy: Secondary | ICD-10-CM

## 2014-04-03 DIAGNOSIS — D649 Anemia, unspecified: Secondary | ICD-10-CM | POA: Insufficient documentation

## 2014-04-03 DIAGNOSIS — I10 Essential (primary) hypertension: Secondary | ICD-10-CM

## 2014-04-03 DIAGNOSIS — E119 Type 2 diabetes mellitus without complications: Secondary | ICD-10-CM

## 2014-04-03 LAB — CBC
HCT: 33.1 % — ABNORMAL LOW (ref 39.0–52.0)
Hemoglobin: 10.7 g/dL — ABNORMAL LOW (ref 13.0–17.0)
MCH: 33.3 pg (ref 26.0–34.0)
MCHC: 32.3 g/dL (ref 30.0–36.0)
MCV: 103.1 fL — ABNORMAL HIGH (ref 78.0–100.0)
Platelets: 199 10*3/uL (ref 150–400)
RBC: 3.21 MIL/uL — ABNORMAL LOW (ref 4.22–5.81)
RDW: 13.8 % (ref 11.5–15.5)
WBC: 7.2 10*3/uL (ref 4.0–10.5)

## 2014-04-03 NOTE — Progress Notes (Signed)
04/03/2014 Benjamin ProfferRobert W Hodge   1941/07/28  [id number]  Primary Physicia Hodge, [name], DO Primary Cardiologist: Dr Benjamin Hodge (new)  HPI:  73 y.o. male with a past medical history significant for hypertension, anxiety/depression, GERD, COPD, IBS, type 1 diabetes (last A1c 7.6);  came to the hospital 03/10/14 secondary to hyperglycemia and not feeling well. He had no history of chest pain. The patient [facility] and forgot to take any of his insulin. At the time that he realized that he had forgot his medication and drove back, his glucometer was just reading high and he was feeling bad.  CBGs 1000 on arrival to the ER; patient also found to have acute on chronic renal failure with a creatinine of 1.7; elevated troponin 1.27 and some new changes in the T T waves and ST depressions on his EKG. He was admitted and Rx'd for DKA. His Troponin peaked at 9. Cath done after hydration revealed 3 vessel CAD and normal LVF. The plan is for medical Rx initially. The pt is here for follow up. He has felt some fatigue but he has not had angina. His daughter in law accompanied him to the office, the pt lives alone. We had a long discussion, reviewing his medications, diet, and what to look for that may indocated medical Rx was not working.     Current Outpatient Prescriptions  Medication Sig Dispense Refill  . aspirin 81 MG tablet Take 81 mg by mouth daily.       Marland Kitchen. atorvastatin (LIPITOR) 40 MG tablet Take 40 mg by mouth at bedtime. For Cholesterol      . clopidogrel (PLAVIX) 75 MG tablet Take 75 mg by mouth daily with breakfast. Blood Thinner      . diphenoxylate-atropine (LOMOTIL) 2.5-0.025 MG per tablet Take 2 tablets by mouth 3 (three) times daily as needed for diarrhea or loose stools.      Marland Kitchen. escitalopram (LEXAPRO) 20 MG tablet Take 20 mg by mouth daily. For Depression      . gabapentin (NEURONTIN) 600 MG tablet 1/2 by mouth daily      . glucose blood test strip 1 each  by Other route See admin instructions. Check blood sugar 3-4 times daily.      Marland Kitchen. HYDROcodone-acetaminophen (NORCO/VICODIN) 5-325 MG per tablet Take 1 tablet by mouth 5 (five) times daily as needed for moderate pain.      . Insulin Glargine (LANTUS) 100 UNIT/ML Solostar Pen Take 6 units in the morning      . Insulin Isophane Human (HUMULIN N) 100 UNIT/ML Kiwkpen Inject 3 Units into the skin every evening.       . insulin lispro (HUMALOG) 100 UNIT/ML KiwkPen Inject 1-6 Units into the skin daily as needed (after meals).       Marland Kitchen. levothyroxine (SYNTHROID, LEVOTHROID) 75 MCG tablet Take 75 mcg by mouth daily before breakfast. For Hypothyroidism      . Melatonin 3 MG TABS Take by mouth at bedtime. For Sleep      . methylphenidate (RITALIN) 20 MG tablet Take 1 tablet (20 mg total) by mouth 2 (two) times daily.  180 tablet  0  . metoprolol tartrate (LOPRESSOR) 25 MG tablet Take 25 mg by mouth 2 (two) times daily.      . pantoprazole (PROTONIX) 40 MG tablet Take 1 tablet (40 mg total) by mouth daily.  30 tablet  5  . pregabalin (LYRICA) 50 MG capsule Take 50-100 mg  by mouth at bedtime.      Marland Kitchen testosterone cypionate (DEPOTESTOTERONE CYPIONATE) 200 MG/ML injection Inject 200 mg into the muscle every 14 (fourteen) days.       . vitamin B-12 (CYANOCOBALAMIN) 1000 MCG tablet Take 1,000 mcg by mouth daily.       No current facility-administered medications for this visit.    Allergies  Allergen Reactions  . Ace Inhibitors Other (See Comments)    dizzy  . Angiotensin Receptor Blockers Other (See Comments)    unknown  . Bee Venom Swelling  . Cymbalta [Duloxetine Hcl] Other (See Comments)    dizzy    History   Social History  . Marital Status: Divorced    Spouse Name: [name]    Number of Children: 1  . Years of Education: 15   Occupational History  . [employer]    Social History Main Topics  . Smoking status: Former Smoker    Types: Cigarettes    Quit date: 09/28/2004  . Smokeless  tobacco: Never Used  . Alcohol Use: No     Comment: quit in 2005  . Drug Use: No  . Sexual Activity: Not on file   Other Topics Concern  . Not on file   Social History Narrative   Patient is right handed, quit caffeinated beverages in 2005.   Works as a Museum/gallery conservator. One son. He is divorced.     Review of Systems: General: negative for chills, fever, night sweats or weight changes.  Cardiovascular: negative for chest pain, dyspnea on exertion, edema, orthopnea, palpitations, paroxysmal nocturnal dyspnea or shortness of breath Dermatological: negative for rash Respiratory: negative for cough or wheezing Urologic: negative for hematuria Abdominal: negative for nausea, vomiting, diarrhea, bright red blood per rectum, melena, or hematemesis Neurologic: negative for visual changes, syncope, or dizziness His family indicates his gait is not steady at times. The pt has had a prior anemia work up by Dr Gaylyn Rong. The pt indicates he has lost 10 lbs over the last 6 months All other systems reviewed and are otherwise negative except as noted above.    Blood pressure 120/60, pulse 80, height 5\' 6"  (1.676 m), weight 124 lb 11.2 oz (56.564 kg).  General appearance: alert, cooperative and no distress Neck: no adenopathy, no JVD, supple, symmetrical, trachea midline, thyroid not enlarged, symmetric, no tenderness/mass/nodules and Rt CA bruit Lungs: clear to auscultation bilaterally Heart: regular rate and rhythm Abdomen: soft, non-tender; bowel sounds normal; no masses,  no organomegaly and glucose monitor Rt LQ Extremities: no edema Pulses: 2+ and symmetric Skin: Skin color, texture, turgor normal. No rashes or lesions Neurologic: Grossly normal   ASSESSMENT AND PLAN:   Non-STEMI (non-ST elevated myocardial infarction) Discharged 03/13/14 after NSTEMI in setting of DKA  DM IDDM with neuropathy  HYPERTENSION Controlled  Anemia- chronic Hgb 9.9 at discharge, previous work up by Dr  Gaylyn Rong   PLAN  Follow up CBC today. His SCr was normal when he was discharged. The family has requested Dr Benjamin Poche be assigned as his cardiologist.   Corine Shelter KPA-C 04/03/2014 9:39 AM

## 2014-04-03 NOTE — Assessment & Plan Note (Signed)
Discharged 03/13/14 after NSTEMI in setting of DKA

## 2014-04-03 NOTE — Assessment & Plan Note (Signed)
Controlled.  

## 2014-04-03 NOTE — Patient Instructions (Signed)
Your physician recommends that you schedule a follow-up appointment in: 3 Months with Dr Antoine Poche  Your physician recommends that you return for lab work CBC

## 2014-04-03 NOTE — Assessment & Plan Note (Signed)
Hgb 9.9 at discharge, previous work up by Dr Gaylyn Rong

## 2014-04-03 NOTE — Assessment & Plan Note (Signed)
IDDM with neuropathy 

## 2014-04-04 ENCOUNTER — Telehealth: Payer: Self-pay | Admitting: *Deleted

## 2014-04-04 NOTE — Telephone Encounter (Signed)
Message copied by Barrie Dunker on Wed Apr 04, 2014  4:58 PM ------      Message from: Abelino Derrick      Created: Wed Apr 04, 2014 10:33 AM       Let pt know his anemia is stable.            Corine Shelter PA-C      04/04/2014      10:33 AM       ------

## 2014-04-04 NOTE — Telephone Encounter (Signed)
Left message for patient to return call for blood work result. 

## 2014-04-05 ENCOUNTER — Telehealth: Payer: Self-pay

## 2014-04-05 ENCOUNTER — Other Ambulatory Visit: Payer: Self-pay | Admitting: *Deleted

## 2014-04-05 ENCOUNTER — Telehealth: Payer: Self-pay | Admitting: Cardiology

## 2014-04-05 MED ORDER — HYDROCODONE-ACETAMINOPHEN 5-325 MG PO TABS: 1.0000 | ORAL_TABLET | Freq: Every day | ORAL | Status: DC | PRN

## 2014-04-05 NOTE — Telephone Encounter (Signed)
Spoke to patient. Result given . Verbalized understanding  cbc was anemia stable

## 2014-04-05 NOTE — Telephone Encounter (Signed)
Diabetic Bundle. Lvom for pt to call back and schedule appointment with Dr. Kumar.  

## 2014-04-05 NOTE — Telephone Encounter (Signed)
° ° °  Pt returning call to get lab results °

## 2014-04-05 NOTE — Telephone Encounter (Signed)
Mr. Ewert was returning a call in regards to his lab work.Please call  Thanks

## 2014-04-09 ENCOUNTER — Other Ambulatory Visit: Payer: Self-pay | Admitting: Internal Medicine

## 2014-04-09 ENCOUNTER — Telehealth: Payer: Self-pay | Admitting: *Deleted

## 2014-04-09 NOTE — Telephone Encounter (Signed)
Appointment scheduled for an acute visit with Dr. Glade Lloyd tomorrow. Patient Notified and agreed.

## 2014-04-09 NOTE — Telephone Encounter (Signed)
Patient came by with concerns of joint pain. First located in arm and now all over in the upper extremity, especially the elbows. Patient is not having pain in the legs. Please Advise.

## 2014-04-09 NOTE — Telephone Encounter (Signed)
I think he should be seen before we prescribe medication.

## 2014-04-10 ENCOUNTER — Ambulatory Visit (INDEPENDENT_AMBULATORY_CARE_PROVIDER_SITE_OTHER): Payer: Medicare Other | Admitting: Internal Medicine

## 2014-04-10 ENCOUNTER — Encounter: Payer: Self-pay | Admitting: Internal Medicine

## 2014-04-10 VITALS — BP 120/70 | HR 65 | Temp 97.0°F | Resp 12 | Ht 66.0 in | Wt 123.0 lb

## 2014-04-10 DIAGNOSIS — I214 Non-ST elevation (NSTEMI) myocardial infarction: Secondary | ICD-10-CM

## 2014-04-10 DIAGNOSIS — IMO0001 Reserved for inherently not codable concepts without codable children: Secondary | ICD-10-CM

## 2014-04-10 DIAGNOSIS — E1049 Type 1 diabetes mellitus with other diabetic neurological complication: Secondary | ICD-10-CM

## 2014-04-10 DIAGNOSIS — E785 Hyperlipidemia, unspecified: Secondary | ICD-10-CM

## 2014-04-10 DIAGNOSIS — E1043 Type 1 diabetes mellitus with diabetic autonomic (poly)neuropathy: Secondary | ICD-10-CM

## 2014-04-10 DIAGNOSIS — G909 Disorder of the autonomic nervous system, unspecified: Secondary | ICD-10-CM

## 2014-04-10 NOTE — Progress Notes (Signed)
Patient ID: Galvin ProfferRobert W Jeffcoat, male   DOB: [birth year], 73 y.o.   MRN: [id number]    Chief Complaint  Patient presents with  . Joint Pain    Generalized all over pain- ? related to Lipitor, patient did not have concerns prior to taking Lipitor    Allergies  Allergen Reactions  . Ace Inhibitors Other (See Comments)    dizzy  . Angiotensin Receptor Blockers Other (See Comments)    unknown  . Bee Venom Swelling  . Cymbalta [Duloxetine Hcl] Other (See Comments)    dizzy   HPI He complaints of aching all over Mainly upper torso, arms and neck area it started 2 weeks back first in his left arm This pain interferes with his sleep Has been on lipitor since his discharge 03/13/14 with recent NSTEMI and that is his new medication Denies fever or chills Has chronic back problems but this is much harsh than that cbg less than 200  Review of Systems  Constitutional: Negative for fever, chills, diaphoresis.  HENT: Negative for congestion, hearing loss and sore throat.   Eyes: Negative for blurred vision, double vision and discharge.  Respiratory: Negative for cough, sputum production, shortness of breath and wheezing.   Cardiovascular: Negative for chest pain, palpitations, orthopnea and leg swelling.  Gastrointestinal: Negative for heartburn, nausea, vomiting, abdominal pain, diarrhea and constipation.  Genitourinary: Negative for dysuria Musculoskeletal: diffuse muscle aches Skin: Negative for itching and rash.  Neurological: Negative for dizziness, tingling, focal weakness and headaches.  Psychiatric/Behavioral: Negative for depression   Past Medical History  Diagnosis Date  . Shoulder pain, left   . Malnutrition 11/28/2008  . Right upper lobe pneumonia 11/28/2008  . Anxiety 07/18/2008  . Irritable bowel syndrome 04/27/2008  . Nocturia 03/13/2008  . COPD (chronic obstructive pulmonary disease) 02/27/2008  . B12 deficiency 02/16/2008    in 5/09: B12 262, MMA 1040  . Chronic  diarrhea 01/01/2008    s/p EGD 10/06: H pylori + gastritis, duodenal biopsy normal (Dr. Elnoria HowardHung); s/p colonoscopy 10/06: hemorrhoids (Dr. Elnoria HowardHung); evaluated by Dr. Yancey FlemingsJohn Perry  in 8/09: tissue transglutaminase AB negative, VIP normal, stool fat content normal, urine 5-HIAA normal; normal GES 11/09 (done for "fluctuating sugars")  . 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 Leg pain, right 09/29/2007  . Fatigue 02/28/2007  . 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 Hyperkalemia 02/09/2007  . Orthostatic hypotension 02/09/2007  . Hypertension 02/09/2007  . Spinal stenosis in cervical region 05/03    MRI  . Erectile dysfunction 09/27/2006    s/p penile implant  . Anemia, iron deficiency 09/27/2006    neg colonoscopy 2006 - Dr. Elnoria HowardHung; ferritin 152; hgb  15.7  on 07/07  . Hypothyroidism 09/27/2006    TSH 2.639  07/07  . Hypersomnia 09/27/2006    evaluated by Dr. Jetty Duhamellinton Young (5/08) and Dr. Vickey Hugerohmeier; PSG 8/09: chronic delayed sleep phase syndrome (patient sleeps during the day and is awake at night), nocturnal myoclonus (eval for RLS and IDA suggested). Pt advised to change sleeping behavior  . Diabetes mellitus type II 09/07/1984    poorly controlled, complicated by peripheral neuropathy, microalbuminuria, mild non proliferative retinopathy, s/p DKA 7/05, on insulin pump x 4/09, started a pump vacation on 11/19/2008  . Hyperlipidemia   . 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 Hemorrhoids   . Chronic kidney disease   . Unspecified hereditary and idiopathic peripheral neuropathy   . Hyperpotassemia   . Other testicular hypofunction   . Impotence of organic origin   .  Other chronic pain   . Other malaise and fatigue   . Unspecified hypothyroidism   . Type II or unspecified type diabetes mellitus with neurological manifestations, not stated as uncontrolled   . Other B-complex deficiencies   . Other organic hypersomnia   . Type I (juvenile type) diabetes mellitus without mention  of complication, not stated as uncontrolled   . Depression   . Attention deficit disorder with hyperactivity(314.01)   . Hypertrophy of prostate without urinary obstruction and other lower urinary tract symptoms (LUTS)   . Lumbago   . ADHD, adult residual type 05/31/2013   Past Surgical History  Procedure Laterality Date  . Penile prosthesis implant    . Tonsillectomy    . Colonoscopy  06/25/2005    Dr. Jeani Hawking   Current Outpatient Prescriptions on File Prior to Visit  Medication Sig Dispense Refill  . aspirin 81 MG tablet Take 81 mg by mouth daily.       . clopidogrel (PLAVIX) 75 MG tablet Take 75 mg by mouth daily with breakfast. Blood Thinner      . diphenoxylate-atropine (LOMOTIL) 2.5-0.025 MG per tablet Take 2 tablets by mouth 3 (three) times daily as needed for diarrhea or loose stools.      Marland Kitchen escitalopram (LEXAPRO) 20 MG tablet TAKE ONE TABLET BY MOUTH ONE TIME DAILY   30 tablet  1  . gabapentin (NEURONTIN) 600 MG tablet 1/2 by mouth daily      . glucose blood test strip 1 each by Other route See admin instructions. Check blood sugar 3-4 times daily.      Marland Kitchen HYDROcodone-acetaminophen (NORCO/VICODIN) 5-325 MG per tablet Take 1 tablet by mouth 5 (five) times daily as needed for moderate pain.  150 tablet  0  . Insulin Glargine (LANTUS) 100 UNIT/ML Solostar Pen Take 6 units in the morning      . Insulin Isophane Human (HUMULIN N) 100 UNIT/ML Kiwkpen Inject 3 Units into the skin every evening.       . insulin lispro (HUMALOG) 100 UNIT/ML KiwkPen Inject 1-6 Units into the skin daily as needed (after meals).       Marland Kitchen levothyroxine (SYNTHROID, LEVOTHROID) 75 MCG tablet Take 75 mcg by mouth daily before breakfast. For Hypothyroidism      . Melatonin 3 MG TABS Take by mouth at bedtime. For Sleep      . methylphenidate (RITALIN) 20 MG tablet Take 1 tablet (20 mg total) by mouth 2 (two) times daily.  180 tablet  0  . metoprolol tartrate (LOPRESSOR) 25 MG tablet Take 25 mg by mouth 2 (two)  times daily.      . pantoprazole (PROTONIX) 40 MG tablet Take 1 tablet (40 mg total) by mouth daily.  30 tablet  5  . pregabalin (LYRICA) 50 MG capsule Take 50-100 mg by mouth at bedtime.      Marland Kitchen testosterone cypionate (DEPOTESTOTERONE CYPIONATE) 200 MG/ML injection Inject 200 mg into the muscle every 14 (fourteen) days.       . vitamin B-12 (CYANOCOBALAMIN) 1000 MCG tablet Take 1,000 mcg by mouth daily.       No current facility-administered medications on file prior to visit.   Physical exam BP 120/70  Pulse 65  Temp(Src) 97 F (36.1 C) (Oral)  Resp 12  Ht 5\' 6"  (1.676 m)  Wt 123 lb (55.792 kg)  BMI 19.86 kg/m2  SpO2 93%  General- elderly male in no acute distress Head- atraumatic, normocephalic Eyes- PERRLA, EOMI, no pallor, no  icterus Neck- no lymphadenopathy, no thyromegaly, no jugular vein distension, no carotid bruit Chest- no chest wall deformities, no chest wall tenderness Cardiovascular- normal s1,s2, no murmurs Respiratory- bilateral clear to auscultation, no wheeze, no rhonchi, no crackles Abdomen- bowel sounds present, soft, non tender Musculoskeletal- able to move all 4 extremities, diffuse muscle aches, adequate strength Neurological- no focal deficit Psychiatry- alert and oriented to person, place and time, normal mood and affect   Lab Results  Component Value Date   HGBA1C 6.4* 03/11/2014   Lipid Panel     Component Value Date/Time   CHOL 105 03/11/2014 0645   TRIG 78 03/11/2014 0645   HDL 43 03/11/2014 0645   CHOLHDL 2.4 03/11/2014 0645   VLDL 16 03/11/2014 0645   LDLCALC 46 03/11/2014 0645   Assessment/plan  1. 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 Will discontinue lipitor for now. Check ck to assess for muscle breakdown. Also rule out hypothyroidism and PMR. If symptoms resolve with holding his lipitor, consider pravastatin for cardioprotective effect with studies showing lesses myopathy incidence with it. Reviewed his recent lipid panel- ldl  at goal - CK - CMP - Sedimentation Rate - TSH  2. Other and unspecified hyperlipidemia ldl at goal. Hold lipitor for now. Review his symptoms and if improved, consider another statin as above or zetia given her recent MI and DM - CMP - Sedimentation Rate  3. 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 Continue current regimen of insulin  4. Non-STEMI (non-ST elevated myocardial infarction) Remains chest pain free. Continue aspirin, plavix, lopressor. Hold statin for now  Reviewed care plan with her PCP- after reviewing labs, consider switching to antoher statin vs starting other lipid lowering agent

## 2014-04-11 ENCOUNTER — Telehealth: Payer: Self-pay | Admitting: *Deleted

## 2014-04-11 LAB — COMPREHENSIVE METABOLIC PANEL
ALT: 24 IU/L (ref 0–44)
AST: 21 IU/L (ref 0–40)
Albumin/Globulin Ratio: 1.9 (ref 1.1–2.5)
Albumin: 3.8 g/dL (ref 3.5–4.8)
Alkaline Phosphatase: 69 IU/L (ref 39–117)
BILIRUBIN TOTAL: 0.4 mg/dL (ref 0.0–1.2)
BUN/Creatinine Ratio: 16 (ref 10–22)
BUN: 19 mg/dL (ref 8–27)
CALCIUM: 8.7 mg/dL (ref 8.6–10.2)
CHLORIDE: 101 mmol/L (ref 97–108)
CO2: 27 mmol/L (ref 18–29)
Creatinine, Ser: 1.21 mg/dL (ref 0.76–1.27)
GFR calc non Af Amer: 60 mL/min/{1.73_m2} (ref >59)
GFR, EST AFRICAN AMERICAN: 69 mL/min/{1.73_m2} (ref >59)
GLUCOSE: 116 mg/dL — AB (ref 65–99)
Globulin, Total: 2 g/dL (ref 1.5–4.5)
POTASSIUM: 5.1 mmol/L (ref 3.5–5.2)
Sodium: 142 mmol/L (ref 134–144)
Total Protein: 5.8 g/dL — ABNORMAL LOW (ref 6.0–8.5)

## 2014-04-11 LAB — CK: CK TOTAL: 52 U/L (ref 24–204)

## 2014-04-11 LAB — TSH: TSH: 3.1 u[IU]/mL (ref 0.450–4.500)

## 2014-04-11 LAB — SEDIMENTATION RATE: SED RATE: 8 mm/h (ref 0–30)

## 2014-04-11 NOTE — Telephone Encounter (Signed)
Message copied by Barrie Dunker on Wed Apr 11, 2014  5:16 PM ------      Message from: Abelino Derrick      Created: Wed Apr 04, 2014 10:33 AM       Let pt know his anemia is stable.            Corine Shelter PA-C      04/04/2014      10:33 AM       ------

## 2014-04-12 ENCOUNTER — Encounter: Payer: Self-pay | Admitting: *Deleted

## 2014-04-26 ENCOUNTER — Telehealth: Payer: Self-pay | Admitting: Endocrinology

## 2014-04-26 ENCOUNTER — Other Ambulatory Visit: Payer: Self-pay | Admitting: Endocrinology

## 2014-04-26 ENCOUNTER — Other Ambulatory Visit (INDEPENDENT_AMBULATORY_CARE_PROVIDER_SITE_OTHER): Payer: Medicare Other

## 2014-04-26 DIAGNOSIS — E1065 Type 1 diabetes mellitus with hyperglycemia: Secondary | ICD-10-CM

## 2014-04-26 DIAGNOSIS — IMO0002 Reserved for concepts with insufficient information to code with codable children: Secondary | ICD-10-CM

## 2014-04-26 DIAGNOSIS — E039 Hypothyroidism, unspecified: Secondary | ICD-10-CM

## 2014-04-26 LAB — COMPREHENSIVE METABOLIC PANEL
ALK PHOS: 66 U/L (ref 39–117)
ALT: 24 U/L (ref 0–53)
AST: 32 U/L (ref 0–37)
Albumin: 3.4 g/dL — ABNORMAL LOW (ref 3.5–5.2)
BILIRUBIN TOTAL: 0.7 mg/dL (ref 0.2–1.2)
BUN: 16 mg/dL (ref 6–23)
CO2: 29 mEq/L (ref 19–32)
Calcium: 8.8 mg/dL (ref 8.4–10.5)
Chloride: 100 mEq/L (ref 96–112)
Creatinine, Ser: 1.1 mg/dL (ref 0.4–1.5)
GFR: 70.09 mL/min (ref >60.00)
Glucose, Bld: 289 mg/dL — ABNORMAL HIGH (ref 70–99)
POTASSIUM: 4.7 meq/L (ref 3.5–5.1)
Sodium: 136 mEq/L (ref 135–145)
Total Protein: 6.4 g/dL (ref 6.0–8.3)

## 2014-04-26 LAB — TSH: TSH: 1.66 u[IU]/mL (ref 0.35–4.50)

## 2014-04-26 LAB — HEMOGLOBIN A1C: Hgb A1c MFr Bld: 7 % — ABNORMAL HIGH (ref 4.6–6.5)

## 2014-04-26 NOTE — Telephone Encounter (Signed)
Patient needs a refill on his test strips  Meter: One touch  Pharmacy: [facility] [address]   Thank you

## 2014-04-29 ENCOUNTER — Encounter: Payer: Self-pay | Admitting: Internal Medicine

## 2014-05-03 ENCOUNTER — Ambulatory Visit (INDEPENDENT_AMBULATORY_CARE_PROVIDER_SITE_OTHER): Payer: Medicare Other | Admitting: Endocrinology

## 2014-05-03 ENCOUNTER — Encounter: Payer: Self-pay | Admitting: Endocrinology

## 2014-05-03 VITALS — BP 142/88 | HR 64 | Temp 98.2°F | Resp 14 | Ht 65.0 in | Wt 120.0 lb

## 2014-05-03 DIAGNOSIS — E1065 Type 1 diabetes mellitus with hyperglycemia: Principal | ICD-10-CM

## 2014-05-03 DIAGNOSIS — E1049 Type 1 diabetes mellitus with other diabetic neurological complication: Secondary | ICD-10-CM

## 2014-05-03 DIAGNOSIS — E1142 Type 2 diabetes mellitus with diabetic polyneuropathy: Secondary | ICD-10-CM

## 2014-05-03 NOTE — Patient Instructions (Signed)
Add 1 unit extra Humalog per 10 grams of fat  Cover all alcohol and Glucerna with Humalog 1unit per 15g Carbs  Lantus 7 units in am  Check sugar 4x daily

## 2014-05-03 NOTE — Progress Notes (Signed)
Patient ID: Benjamin Hodge, male   DOB: May 18, 1941, 73 y.o.   MRN: [id number]   Reason for Appointment : Follow up for Type 1 Diabetes  History of Present Illness          Diagnosis: Type 1 diabetes mellitus, date of diagnosis: 1986         Past history: Patient has had very labile diabetes and generally difficult to control for several years. He had previously been tried on an Accu-Chek pump also which did not appear to be unofficial and he was not very keen on continuing Has been tried on basal bolus insulin regimen for several years. His insulin has been changed continuously without achieving good control. Has been tried on Levemir instead of Lantus without any significant benefit. Also because of tendency to markedly increased blood sugars overnight he has been started on NPH insulin at bedtime along with his twice a day Lantus  INSULIN regimen is described as: LANTUS 6 units at breakfast. Humulin N 3 at bedtime,   Humalog PC. 1:20 carbohydrate coverage, usually 3 in am and 5 at supper;  correction 1:50-100  Recent history:  On his last visit in 6/14 he was getting up relatively low readings and his Lantus was reduced to 6 units once a day Continues to take 3 units of NPH at bedtime However his blood sugars have been out of control and he also has had an episode of ketoacidosis associated with an MI in 7/15 His blood sugars are now continuing to be out of control with marked hyperglycemia most of the day His PCP told him to start drinking Glucerna for weight gain but is not covering this with any Humalog  Glucose patterns and problems identified:  Fasting blood sugars are overall high and only has fairly good readings recently  Blood sugars may be somewhat better about 6 hours after his first meal  But not consistently  Blood sugars tend to be significantly high late at night when he is eating dinner  He now says that his late evening blood sugars may be high because of his  drinking mixed alcoholic drinks, up to 3 a day. He had been previously told to Humalog to cover the drinks but he is not doing so  He is also not covering his Glucerna with any Humalog even though it contains 20 g of carbohydrate  He is still not taking extra insulin for his late evening meal when he has a higher fat intake and blood sugars appear to be higher if checked later at night  A1c is falsely low since his home blood sugar average is 330 compared to A1c of 7%  He takes his Humalog after eating because he can estimate his carbohydrate intake  better, sometimes eating desserts No hypoglycemia recently He is not using his continuous glucose monitor because of the expense  PREMEAL Breakfast  7-10 PM  Dinner  4-6 AM  Overall  Glucose range:  141-321   78-575   116-543   340-526    Mean/median:  260      321    Self-care: The diet that the patient has been following is: Usually eating frozen meal at dinnertime which is relatively high fat  Meals: 2 meals per day.  breakfast maybe high fat with fruit, usually 60 g carb am and 100g at dinner  Physical activity: exercise: No programmed activity.           Wt Readings from Last 3 Encounters:  05/03/14 120 lb (54.432 kg)  04/10/14 123 lb (55.792 kg)  04/03/14 124 lb 11.2 oz (56.564 kg)   Lab Results  Component Value Date   HGBA1C 7.0* 04/26/2014   HGBA1C 6.4* 03/11/2014   HGBA1C 6.5* 03/10/2014   Lab Results  Component Value Date   MICROALBUR 2.0* 11/27/2013   LDLCALC 46 03/11/2014   CREATININE 1.1 04/26/2014       Medication List       This list is accurate as of: 05/03/14  4:06 PM.  Always use your most recent med list.               aspirin 81 MG tablet  Take 81 mg by mouth daily.     clopidogrel 75 MG tablet  Commonly known as:  PLAVIX  Take 75 mg by mouth daily with breakfast. Blood Thinner     diphenoxylate-atropine 2.5-0.025 MG per tablet  Commonly known as:  LOMOTIL  Take 2 tablets by mouth 3 (three) times daily  as needed for diarrhea or loose stools.     escitalopram 20 MG tablet  Commonly known as:  LEXAPRO  TAKE ONE TABLET BY MOUTH ONE TIME DAILY     gabapentin 600 MG tablet  Commonly known as:  NEURONTIN  1/2 by mouth daily     HYDROcodone-acetaminophen 5-325 MG per tablet  Commonly known as:  NORCO/VICODIN  Take 1 tablet by mouth 5 (five) times daily as needed for moderate pain.     Insulin Glargine 100 UNIT/ML Solostar Pen  Commonly known as:  LANTUS  Take 6 units in the morning     insulin lispro 100 UNIT/ML KiwkPen  Commonly known as:  HUMALOG  Inject 1-6 Units into the skin daily as needed (after meals).     Insulin NPH (Human) (Isophane) 100 UNIT/ML Kiwkpen  Commonly known as:  HUMULIN N  Inject 3 Units into the skin every evening.     levothyroxine 75 MCG tablet  Commonly known as:  SYNTHROID, LEVOTHROID  Take 75 mcg by mouth daily before breakfast. For Hypothyroidism     Melatonin 3 MG Tabs  Take by mouth at bedtime. For Sleep     methylphenidate 20 MG tablet  Commonly known as:  RITALIN  Take 20 mg by mouth daily.     metoprolol tartrate 25 MG tablet  Commonly known as:  LOPRESSOR  Take 25 mg by mouth 2 (two) times daily.     ONE TOUCH ULTRA TEST test strip  Generic drug:  glucose blood  TEST 8 TIMES PER DAY FOR 30 DAYS     pantoprazole 40 MG tablet  Commonly known as:  PROTONIX  Take 1 tablet (40 mg total) by mouth daily.     pregabalin 50 MG capsule  Commonly known as:  LYRICA  Take 50-100 mg by mouth at bedtime.     testosterone cypionate 200 MG/ML injection  Commonly known as:  DEPOTESTOTERONE CYPIONATE  Inject 200 mg into the muscle every 14 (fourteen) days.     vitamin B-12 1000 MCG tablet  Commonly known as:  CYANOCOBALAMIN  Take 1,000 mcg by mouth daily.        Allergies:  Allergies  Allergen Reactions  . Ace Inhibitors Other (See Comments)    dizzy  . Angiotensin Receptor Blockers Other (See Comments)    unknown  . Bee Venom  Swelling  . Cymbalta [Duloxetine Hcl] Other (See Comments)    dizzy    Past Medical History  Diagnosis Date  .  Shoulder pain, left   . Malnutrition 11/28/2008  . Right upper lobe pneumonia 11/28/2008  . Anxiety 07/18/2008  . Irritable bowel syndrome 04/27/2008  . Nocturia 03/13/2008  . COPD (chronic obstructive pulmonary disease) 02/27/2008  . B12 deficiency 02/16/2008    in 5/09: B12 262, MMA 1040  . Chronic diarrhea 01/01/2008    s/p EGD 10/06: H pylori + gastritis, duodenal biopsy normal (Dr. Elnoria Howard); s/p colonoscopy 10/06: hemorrhoids (Dr. Elnoria Howard); evaluated by Dr. Yancey Flemings  in 8/09: tissue transglutaminase AB negative, VIP normal, stool fat content normal, urine 5-HIAA normal; normal GES 11/09 (done for "fluctuating sugars")  . 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 Leg pain, right 09/29/2007  . Fatigue 02/28/2007  . 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 Hyperkalemia 02/09/2007  . Orthostatic hypotension 02/09/2007  . Hypertension 02/09/2007  . Spinal stenosis in cervical region 05/03    MRI  . Erectile dysfunction 09/27/2006    s/p penile implant  . Anemia, iron deficiency 09/27/2006    neg colonoscopy 2006 - Dr. Elnoria Howard; ferritin 152; hgb  15.7  on 07/07  . Hypothyroidism 09/27/2006    TSH 2.639  07/07  . Hypersomnia 09/27/2006    evaluated by Dr. Jetty Duhamel (5/08) and Dr. Vickey Huger; PSG 8/09: chronic delayed sleep phase syndrome (patient sleeps during the day and is awake at night), nocturnal myoclonus (eval for RLS and IDA suggested). Pt advised to change sleeping behavior  . Diabetes mellitus type II 09/07/1984    poorly controlled, complicated by peripheral neuropathy, microalbuminuria, mild non proliferative retinopathy, s/p DKA 7/05, on insulin pump x 4/09, started a pump vacation on 11/19/2008  . Hyperlipidemia   . 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 Hemorrhoids   . Chronic kidney disease   . Unspecified hereditary and idiopathic  peripheral neuropathy   . Hyperpotassemia   . Other testicular hypofunction   . Impotence of organic origin   . Other chronic pain   . Other malaise and fatigue   . Unspecified hypothyroidism   . Type II or unspecified type diabetes mellitus with neurological manifestations, not stated as uncontrolled   . Other B-complex deficiencies   . Other organic hypersomnia   . Type I (juvenile type) diabetes mellitus without mention of complication, not stated as uncontrolled   . Depression   . Attention deficit disorder with hyperactivity(314.01)   . Hypertrophy of prostate without urinary obstruction and other lower urinary tract symptoms (LUTS)   . Lumbago   . ADHD, adult residual type 05/31/2013  . Type I (juvenile type) diabetes mellitus with neurological manifestations, uncontrolled 09/27/2006    Annotation: HBA1C 8.2 11/07. microalb. creat ratio 71 on 03/07, decreased  sensation right foot causing cellulitis 09/06 Qualifier: Diagnosis of  By: Renae Fickle MD, Bhakti      Past Surgical History  Procedure Laterality Date  . Penile prosthesis implant    . Tonsillectomy    . Colonoscopy  06/25/2005    Dr. Jeani Hawking    Family History  Problem Relation Age of Onset  . Bone cancer Mother   . Breast cancer Mother   . Cancer Mother   . Lung cancer Father   . Cancer Father   . Breast cancer Sister   . Cancer Sister   . Lung cancer Brother   . Cancer Sister     type unknown    Social History:  reports that he quit smoking about 9 years ago. His smoking use included Cigarettes. He smoked 0.00 packs per day. He has  never used smokeless tobacco. He reports that he does not drink alcohol or use illicit drugs.    Review of Systems:   Weight loss: He had lost about 10 pounds previously and recently has lost 3 pounds  ANEMIA: His hemoglobin  Has been low and this is followed by hematologist   Lab Results  Component Value Date   WBC 7.2 04/03/2014   HGB 10.7* 04/03/2014   HCT 33.1* 04/03/2014    MCV 103.1* 04/03/2014   PLT 199 04/03/2014     He has been diagnosed to have hypogonadism and is  being treated by his urologist with injections every 2 weeks  He has had mild hypertension followed by PCP. Today in the shower he was feeling lightheaded. Blood pressure is lower than usual now  History of chronic diarrhea of unclear etiology   Has had BPH with significant nocturia   History of hypothyroidism, currently taking 75 mcg   Lab Results  Component Value Date   TSH 1.66 04/26/2014   Has good control of his lipids  Lab Results  Component Value Date   CHOL 105 03/11/2014   HDL 43 03/11/2014   LDLCALC 46 03/11/2014   TRIG 78 03/11/2014   CHOLHDL 2.4 03/11/2014    Physical Examination:  BP 142/88  Pulse 64  Temp(Src) 98.2 F (36.8 C)  Resp 14  Ht  (1.651 m)  Wt 120 lb (54.432 kg)  BMI 19.97 kg/m2  SpO2 95%         ASSESSMENT/PLAN:   Diabetes type 1:   His basal insulin requirement appears to be much higher His main difficulty with hyperglycemia is related to having mixed alcoholic drinks that he has started doing again Also is having Glucerna and other carbohydrates not covered by Humalog His meal times are again consistent of high fat frozen foods and appears not to get enough coverage for his evening meals; not often checking postprandial readings because he is eating before bedtime See history of present illness for detailed discussion of his current blood sugar patterns and problems identified His overall control is limited by his tendency to severe hypoglycemia and hypoglycemia unawareness  Since fasting readings are mostly high he probably needs a little more basal insulin also, will start with 7 units Lantus However his major insulin requirement will be for his meals and drinks Advised him to reduce his alcohol and drinks and coverall carbohydrates He can try to estimate the coverage for some of these drinks by doing a blood sugar before and 2 hours after  and noting the amount of rise in blood sugar He is still waiting  to get the insulin pump with a sensor from [employer]   Patient Instructions  Add 1 unit extra Humalog per 10 grams of fat  Cover all alcohol and Glucerna with Humalog 1unit per 15g Carbs  Lantus 7 units in am  Check sugar 4x daily     Counseling time over 50% of today's 25 minute visit  Benjamin Hodge 05/03/2014, 4:06 PM

## 2014-05-04 ENCOUNTER — Other Ambulatory Visit: Payer: Self-pay | Admitting: *Deleted

## 2014-05-04 ENCOUNTER — Telehealth: Payer: Self-pay | Admitting: Endocrinology

## 2014-05-04 MED ORDER — INSULIN PEN NEEDLE 32G X 4 MM MISC: 1.0000 | Freq: Three times a day (TID) | Status: DC

## 2014-05-04 NOTE — Telephone Encounter (Signed)
Pt would like a referral to Dr. Fannie Knee, pulmonology please call pt with status

## 2014-05-07 ENCOUNTER — Other Ambulatory Visit: Payer: Self-pay | Admitting: *Deleted

## 2014-05-07 MED ORDER — HYDROCODONE-ACETAMINOPHEN 5-325 MG PO TABS: 1.0000 | ORAL_TABLET | Freq: Every day | ORAL | Status: DC | PRN

## 2014-05-07 NOTE — Telephone Encounter (Signed)
Patient Requested 

## 2014-05-07 NOTE — Telephone Encounter (Signed)
This needs to be done by PCP.

## 2014-05-07 NOTE — Telephone Encounter (Signed)
Noted, left message on patients vm 

## 2014-05-07 NOTE — Telephone Encounter (Signed)
Please see below and advise.

## 2014-05-10 ENCOUNTER — Telehealth: Payer: Self-pay | Admitting: Endocrinology

## 2014-05-10 ENCOUNTER — Other Ambulatory Visit: Payer: Self-pay | Admitting: Endocrinology

## 2014-05-10 NOTE — Telephone Encounter (Signed)
Patient called stating that Dr. Janna Arch was to refer him to Pulmonary   Please advise patient    Thank You

## 2014-05-11 ENCOUNTER — Telehealth: Payer: Self-pay | Admitting: *Deleted

## 2014-05-11 DIAGNOSIS — G4710 Hypersomnia, unspecified: Secondary | ICD-10-CM

## 2014-05-11 NOTE — Telephone Encounter (Signed)
Patient called and wants a referral to Dr. Levonne Spiller Young-Pulmonologist for sleep disorder. Please Advise.

## 2014-05-12 NOTE — Telephone Encounter (Signed)
This is fine with me.  We will need more details about his sleeping problems to do a referral--I guess he thinks he has sleep apnea?  Does he have trouble falling asleep? Staying asleep? Is he sleepy in the daytime?  Does he snore?

## 2014-05-15 NOTE — Telephone Encounter (Signed)
Patient states that he does not have sleep apnea and does not have trouble falling asleep. Is not sleepy during the day and does not snore. He stated that he just sleeps way too much 12 or more hours a day. Patient doesn't think this is healthy and wants to know what is going on. Patient states he goes to bed at 1am and sleeps till 1pm, he also stated he can go at 10pm and sleep till 10am.  Referral placed and patient understands.

## 2014-05-16 ENCOUNTER — Ambulatory Visit: Payer: Medicare Other | Admitting: Cardiology

## 2014-05-29 ENCOUNTER — Other Ambulatory Visit: Payer: Self-pay | Admitting: Internal Medicine

## 2014-05-29 MED ORDER — METOPROLOL TARTRATE 25 MG PO TABS: 25.0000 mg | ORAL_TABLET | Freq: Two times a day (BID) | ORAL | Status: DC

## 2014-05-29 NOTE — Telephone Encounter (Signed)
[facility]

## 2014-06-04 ENCOUNTER — Other Ambulatory Visit: Payer: Medicare Other

## 2014-06-05 ENCOUNTER — Other Ambulatory Visit: Payer: Medicare Other

## 2014-06-06 ENCOUNTER — Other Ambulatory Visit: Payer: Self-pay | Admitting: *Deleted

## 2014-06-06 ENCOUNTER — Encounter: Payer: Self-pay | Admitting: Internal Medicine

## 2014-06-06 ENCOUNTER — Ambulatory Visit (INDEPENDENT_AMBULATORY_CARE_PROVIDER_SITE_OTHER): Payer: Medicare Other | Admitting: Internal Medicine

## 2014-06-06 VITALS — BP 140/78 | HR 61 | Temp 98.1°F | Resp 10 | Ht 66.5 in | Wt 122.0 lb

## 2014-06-06 DIAGNOSIS — F909 Attention-deficit hyperactivity disorder, unspecified type: Secondary | ICD-10-CM

## 2014-06-06 DIAGNOSIS — F908 Attention-deficit hyperactivity disorder, other type: Secondary | ICD-10-CM

## 2014-06-06 DIAGNOSIS — E039 Hypothyroidism, unspecified: Secondary | ICD-10-CM

## 2014-06-06 DIAGNOSIS — E1142 Type 2 diabetes mellitus with diabetic polyneuropathy: Secondary | ICD-10-CM

## 2014-06-06 DIAGNOSIS — I10 Essential (primary) hypertension: Secondary | ICD-10-CM

## 2014-06-06 DIAGNOSIS — S92902D Unspecified fracture of left foot, subsequent encounter for fracture with routine healing: Secondary | ICD-10-CM

## 2014-06-06 DIAGNOSIS — E1065 Type 1 diabetes mellitus with hyperglycemia: Secondary | ICD-10-CM

## 2014-06-06 DIAGNOSIS — G4710 Hypersomnia, unspecified: Secondary | ICD-10-CM

## 2014-06-06 DIAGNOSIS — E785 Hyperlipidemia, unspecified: Secondary | ICD-10-CM

## 2014-06-06 DIAGNOSIS — E1049 Type 1 diabetes mellitus with other diabetic neurological complication: Secondary | ICD-10-CM

## 2014-06-06 DIAGNOSIS — Z23 Encounter for immunization: Secondary | ICD-10-CM

## 2014-06-06 DIAGNOSIS — M545 Low back pain, unspecified: Secondary | ICD-10-CM

## 2014-06-06 DIAGNOSIS — S8290XD Unspecified fracture of unspecified lower leg, subsequent encounter for closed fracture with routine healing: Secondary | ICD-10-CM

## 2014-06-06 LAB — LIPID PANEL
CHOLESTEROL TOTAL: 137 mg/dL (ref 100–199)
Chol/HDL Ratio: 2.5 ratio units (ref 0.0–5.0)
HDL: 54 mg/dL (ref >39)
LDL Calculated: 69 mg/dL (ref 0–99)
TRIGLYCERIDES: 72 mg/dL (ref 0–149)
VLDL CHOLESTEROL CAL: 14 mg/dL (ref 5–40)

## 2014-06-06 LAB — COMPREHENSIVE METABOLIC PANEL
ALBUMIN: 3.5 g/dL (ref 3.5–4.8)
ALT: 16 IU/L (ref 0–44)
AST: 21 IU/L (ref 0–40)
Albumin/Globulin Ratio: 1.5 (ref 1.1–2.5)
Alkaline Phosphatase: 68 IU/L (ref 39–117)
BUN/Creatinine Ratio: 16 (ref 10–22)
BUN: 16 mg/dL (ref 8–27)
CALCIUM: 8.7 mg/dL (ref 8.6–10.2)
CHLORIDE: 102 mmol/L (ref 97–108)
CO2: 26 mmol/L (ref 18–29)
Creatinine, Ser: 0.98 mg/dL (ref 0.76–1.27)
GFR calc Af Amer: 89 mL/min/{1.73_m2} (ref >59)
GFR calc non Af Amer: 77 mL/min/{1.73_m2} (ref >59)
Globulin, Total: 2.3 g/dL (ref 1.5–4.5)
Glucose: 267 mg/dL — ABNORMAL HIGH (ref 65–99)
POTASSIUM: 5.3 mmol/L — AB (ref 3.5–5.2)
Sodium: 141 mmol/L (ref 134–144)
Total Bilirubin: 0.4 mg/dL (ref 0.0–1.2)
Total Protein: 5.8 g/dL — ABNORMAL LOW (ref 6.0–8.5)

## 2014-06-06 LAB — HEMOGLOBIN A1C
Est. average glucose Bld gHb Est-mCnc: 194 mg/dL
HEMOGLOBIN A1C: 8.4 % — AB (ref 4.8–5.6)

## 2014-06-06 MED ORDER — HYDROCODONE-ACETAMINOPHEN 5-325 MG PO TABS: 1.0000 | ORAL_TABLET | Freq: Every day | ORAL | Status: DC | PRN

## 2014-06-06 NOTE — Progress Notes (Signed)
Patient ID: Benjamin Hodge, male   DOB: 12/30/1940, 73 y.o.   MRN: [id number]      PAM    Place of Service:   OFFICE    Allergies  Allergen Reactions  . Ace Inhibitors Other (See Comments)    dizzy  . Angiotensin Receptor Blockers Other (See Comments)    unknown  . Bee Venom Swelling  . Cymbalta [Duloxetine Hcl] Other (See Comments)    dizzy    Chief Complaint  Patient presents with  . Medical Management of Chronic Issues    2 month follow-up, discuss labs (copy printed)   . Medication Management    Patient is in the doughnut hole, need to discuss medicaitons     HPI:  He thinks his health varies. Pain in the forearm is better, but he is having discomfort in the left upper chest/ pectoral muscle going into the arm pit for the last month. Present daily.   HYPERSOMNIA: Saw Dr. Vickey Huger for excessive drowsiness. Not unusual to sleep 11-14 hours. 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 Lucianne Muss about his diabetes.    Medications: Patient's Medications  New Prescriptions   No medications on file  Previous Medications   ASPIRIN 81 MG TABLET    Take 81 mg by mouth daily.    CLOPIDOGREL (PLAVIX) 75 MG TABLET    Take 75 mg by mouth daily with breakfast. Blood Thinner   DIPHENOXYLATE-ATROPINE (LOMOTIL) 2.5-0.025 MG PER TABLET    Take 2 tablets by mouth 3 (three) times daily as needed for diarrhea or loose stools.   ESCITALOPRAM (LEXAPRO) 20 MG TABLET    TAKE ONE TABLET BY MOUTH ONE TIME DAILY    GABAPENTIN (NEURONTIN) 600 MG TABLET    1/2 by mouth daily   HYDROCODONE-ACETAMINOPHEN (NORCO/VICODIN) 5-325 MG PER TABLET    Take 1 tablet by mouth 5 (five) times daily as needed for moderate pain.   INSULIN GLARGINE (LANTUS) 100 UNIT/ML SOLOSTAR PEN    Take 6 units in the morning   INSULIN ISOPHANE HUMAN (HUMULIN N) 100 UNIT/ML KIWKPEN    Inject 3 Units into the skin every evening.    INSULIN LISPRO (HUMALOG) 100 UNIT/ML KIWKPEN    Inject 1-6 Units into the skin daily as needed (after meals).    INSULIN PEN NEEDLE (BD PEN NEEDLE NANO U/F) 32G X 4 MM MISC    1 each by Does not apply route 3 (three) times daily.   LEVOTHYROXINE (SYNTHROID, LEVOTHROID) 75 MCG TABLET    TAKE ONE TABLET BY MOUTH ONE TIME DAILY FOR THYROID    MELATONIN 3 MG TABS    Take by mouth at bedtime. For Sleep   METHYLPHENIDATE (RITALIN) 20 MG TABLET    Take 20 mg by mouth daily.   METOPROLOL TARTRATE (LOPRESSOR) 25 MG TABLET    Take 1 tablet (25 mg total) by mouth 2 (two) times daily.   ONE TOUCH ULTRA TEST TEST STRIP    TEST 8 TIMES PER DAY FOR 30 DAYS   PANTOPRAZOLE (PROTONIX) 40 MG TABLET  Take 1 tablet (40 mg total) by mouth daily.   PREGABALIN (LYRICA) 50 MG CAPSULE    Take 50-100 mg by mouth at bedtime.   TESTOSTERONE CYPIONATE (DEPOTESTOTERONE CYPIONATE) 200 MG/ML INJECTION    Inject 200 mg into the muscle every 14 (fourteen) days.    VITAMIN B-12 (CYANOCOBALAMIN) 1000 MCG TABLET    Take 1,000 mcg by mouth daily.  Modified Medications   No medications on file  Discontinued Medications   HUMALOG 100 UNIT/ML CARTRIDGE    INJECT 8-10 UNITS THREE TIMES A DAY WITH MEALS   LEVOTHYROXINE (SYNTHROID, LEVOTHROID) 75 MCG TABLET    Take 75 mcg by mouth daily before breakfast. For Hypothyroidism     Review of Systems  Constitutional: Negative for fever, activity change, appetite change, fatigue and unexpected weight change.  HENT: Negative  for ear pain, hearing loss, mouth sores, sore throat and tinnitus.   Eyes: Negative.   Respiratory: Negative for cough, choking, shortness of breath and wheezing.   Cardiovascular: Negative for chest pain, palpitations and leg swelling.  Gastrointestinal: Positive for diarrhea.  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  Musculoskeletal:       Tender right foot Pain in the bend of the left arm.  Skin: Negative.   Allergic/Immunologic: Negative.   Neurological: Positive for weakness and numbness. Negative for dizziness, tremors and headaches.  Psychiatric/Behavioral: Negative for hallucinations, behavioral problems, dysphoric mood and agitation.    Filed Vitals:   06/06/14 1643  BP: 140/78  Pulse: 61  Temp: 98.1 F (36.7 C)  TempSrc: Oral  Resp: 10  Height: 5' 6.5" (1.689 m)  Weight: 122 lb (55.339 kg)  SpO2: 95%   Body mass index is 19.4 kg/(m^2).  Physical Exam  Constitutional: He is oriented to person, place, and time. He appears well-developed and well-nourished. He appears distressed.  HENT:  Head: Normocephalic.  Right Ear: External ear normal.  Left Ear: External ear normal.  Nose: Nose normal.  Mouth/Throat: Oropharynx is clear and moist. No oropharyngeal exudate.  Eyes: Conjunctivae and EOM are normal. Pupils are equal, round, and reactive to light.  Neck: No JVD present. No tracheal deviation present. No thyromegaly present.  Cardiovascular: Normal rate, regular rhythm, normal heart sounds and intact distal pulses.  Exam reveals no friction rub.   No murmur heard. Pulmonary/Chest: No respiratory distress. He has no wheezes. He has no rales. He exhibits no tenderness.  Abdominal: He exhibits no distension and no mass. There is no tenderness.  Genitourinary: Left testis shows tenderness.  Musculoskeletal: Normal range of motion. He exhibits tenderness. He exhibits no edema.  Lymphadenopathy:     He has no cervical adenopathy.  Neurological: He is alert and oriented to person, place, and time. He has normal reflexes. No cranial nerve deficit. Coordination normal.  Skin: No rash noted. No erythema. No pallor.  Psychiatric: He has a normal mood and affect. His behavior is normal. Thought content normal.     Labs reviewed: Appointment on 06/05/2014  Component Date Value Ref Range Status  . Cholesterol, Total 06/05/2014 137  100 - 199 mg/dL Final  . Triglycerides 06/05/2014 72  0 - 149 mg/dL Final  . HDL 16/06/9603 54  >39 mg/dL Final   Comment: According to ATP-III Guidelines, HDL-C >59 mg/dL is considered a  negative risk factor for CHD.  Marland Kitchen. VLDL Cholesterol Cal 06/05/2014 14  5 - 40 mg/dL Final  . LDL Calculated 06/05/2014 69  0 - 99 mg/dL Final  . Chol/HDL Ratio 06/05/2014 2.5  0.0 - 5.0 ratio units Final   Comment:                                   T. Chol/HDL Ratio                                                                      Men  Women                                                        1/2 Avg.Risk  3.4    3.3                                                            Avg.Risk  5.0    4.4                                                         2X Avg.Risk  9.6    7.1                                                         3X Avg.Risk 23.4   11.0  . Hemoglobin A1C 06/05/2014 8.4* 4.8 - 5.6 % Final   Comment:          Increased risk for diabetes: 5.7 - 6.4                                   Diabetes: >6.4                                   Glycemic control for adults with diabetes: <7.0  . Estimated average glucose 06/05/2014 194   Final  . Glucose 06/05/2014 267* 65 - 99 mg/dL Final  . BUN 16/10/960409/29/2015 16  8 - 27 mg/dL Final  . Creatinine, Ser 06/05/2014 0.98  0.76 - 1.27 mg/dL Final  . GFR calc non Af Amer 06/05/2014 77  >59 mL/min/1.73 Final  . GFR calc Af Amer 06/05/2014 89  >59 mL/min/1.73 Final  . BUN/Creatinine Ratio 06/05/2014 16   10 - 22 Final  . Sodium  06/05/2014 141  134 - 144 mmol/L Final  . Potassium 06/05/2014 5.3* 3.5 - 5.2 mmol/L Final  . Chloride 06/05/2014 102  97 - 108 mmol/L Final  . CO2 06/05/2014 26  18 - 29 mmol/L Final  . Calcium 06/05/2014 8.7  8.6 - 10.2 mg/dL Final  . Total Protein 06/05/2014 5.8* 6.0 - 8.5 g/dL Final  . Albumin 13/04/6577 3.5  3.5 - 4.8 g/dL Final  . Globulin, Total 06/05/2014 2.3  1.5 - 4.5 g/dL Final  . Albumin/Globulin Ratio 06/05/2014 1.5  1.1 - 2.5 Final  . Total Bilirubin 06/05/2014 0.4  0.0 - 1.2 mg/dL Final  . Alkaline Phosphatase 06/05/2014 68  39 - 117 IU/L Final  . AST 06/05/2014 21  0 - 40 IU/L Final  . ALT 06/05/2014 16  0 - 44 IU/L Final  Appointment on 04/26/2014  Component Date Value Ref Range Status  . Sodium 04/26/2014 136  135 - 145 mEq/L Final  . Potassium 04/26/2014 4.7  3.5 - 5.1 mEq/L Final  . Chloride 04/26/2014 100  96 - 112 mEq/L Final  . CO2 04/26/2014 29  19 - 32 mEq/L Final  . Glucose, Bld 04/26/2014 289* 70 - 99 mg/dL Final  . BUN 46/96/2952 16  6 - 23 mg/dL Final  . Creatinine, Ser 04/26/2014 1.1  0.4 - 1.5 mg/dL Final  . Total Bilirubin 04/26/2014 0.7  0.2 - 1.2 mg/dL Final  . Alkaline Phosphatase 04/26/2014 66  39 - 117 U/L Final  . AST 04/26/2014 32  0 - 37 U/L Final  . ALT 04/26/2014 24  0 - 53 U/L Final  . Total Protein 04/26/2014 6.4  6.0 - 8.3 g/dL Final  . Albumin 84/13/2440 3.4* 3.5 - 5.2 g/dL Final  . Calcium 07/04/2535 8.8  8.4 - 10.5 mg/dL Final  . GFR 64/40/3474 70.09  >60.00 mL/min Final  . Hemoglobin A1C 04/26/2014 7.0* 4.6 - 6.5 % Final   Glycemic Control Guidelines for People with Diabetes:Non Diabetic:  <6%Goal of Therapy: <7%Additional Action Suggested:  >8%   . TSH 04/26/2014 1.66  0.35 - 4.50 uIU/mL Final  Office Visit on 04/10/2014  Component Date Value Ref Range Status  . Total CK 04/10/2014 52  24 - 204 U/L Final  . Glucose 04/10/2014 116* 65 - 99 mg/dL Final  . BUN 25/95/6387 19  8 - 27 mg/dL Final  . Creatinine,  Ser 04/10/2014 1.21  0.76 - 1.27 mg/dL Final  . GFR calc non Af Amer 04/10/2014 60  >59 mL/min/1.73 Final  . GFR calc Af Amer 04/10/2014 69  >59 mL/min/1.73 Final  . BUN/Creatinine Ratio 04/10/2014 16  10 - 22 Final  . Sodium 04/10/2014 142  134 - 144 mmol/L Final  . Potassium 04/10/2014 5.1  3.5 - 5.2 mmol/L Final  . Chloride 04/10/2014 101  97 - 108 mmol/L Final  . CO2 04/10/2014 27  18 - 29 mmol/L Final  . Calcium 04/10/2014 8.7  8.6 - 10.2 mg/dL Final  . Total Protein 04/10/2014 5.8* 6.0 - 8.5 g/dL Final  . Albumin 56/43/3295 3.8  3.5 - 4.8 g/dL Final  . Globulin, Total 04/10/2014 2.0  1.5 - 4.5 g/dL Final  . Albumin/Globulin Ratio 04/10/2014 1.9  1.1 - 2.5 Final  . Total Bilirubin 04/10/2014 0.4  0.0 - 1.2 mg/dL Final  . Alkaline Phosphatase 04/10/2014 69  39 - 117 IU/L Final  . AST 04/10/2014 21  0 - 40 IU/L Final  . ALT 04/10/2014 24  0 - 44 IU/L  Final  . Sed Rate 04/10/2014 8  0 - 30 mm/hr Final  . TSH 04/10/2014 3.100  0.450 - 4.500 uIU/mL Final  Office Visit on 04/03/2014  Component Date Value Ref Range Status  . WBC 04/03/2014 7.2  4.0 - 10.5 K/uL Final  . RBC 04/03/2014 3.21* 4.22 - 5.81 MIL/uL Final  . Hemoglobin 04/03/2014 10.7* 13.0 - 17.0 g/dL Final  . HCT 97/67/3419 33.1* 39.0 - 52.0 % Final  . MCV 04/03/2014 103.1* 78.0 - 100.0 fL Final  . MCH 04/03/2014 33.3  26.0 - 34.0 pg Final  . MCHC 04/03/2014 32.3  30.0 - 36.0 g/dL Final  . RDW 37/90/2409 13.8  11.5 - 15.5 % Final  . Platelets 04/03/2014 199  150 - 400 K/uL Final  Admission on 03/10/2014, Discharged on 03/13/2014  No results displayed because visit has over 200 results.       Assessment/Plan  1. ADHD, adult residual type Continue methylphenidate  2. Closed fracture of left foot, with routine healing, subsequent encounter Pain under better control  3. HYPERSOMNIA No etiology apparent  4. HYPERTENSION - Comprehensive metabolic panel; Future  5. HYPOTHYROIDISM NOS - TSH; Future  6. Low back  pain potentially associated with spinal stenosis stable  7. POLYNEUROPATHY IN DIABETES unchanged  8. Other and unspecified hyperlipidemia - Lipid panel; Future  9. Type I (juvenile type) diabetes mellitus with neurological manifestations, uncontrolled - Comprehensive metabolic panel; Future - Hemoglobin A1c; Future - Microalbumin / creatinine urine ratio; Future  10. Need for prophylactic vaccination and inoculation against influenza Fluvax

## 2014-06-06 NOTE — Telephone Encounter (Signed)
Patient requested and picked up 

## 2014-06-14 ENCOUNTER — Encounter: Payer: Self-pay | Admitting: Endocrinology

## 2014-06-14 ENCOUNTER — Ambulatory Visit (INDEPENDENT_AMBULATORY_CARE_PROVIDER_SITE_OTHER): Payer: Medicare Other | Admitting: Endocrinology

## 2014-06-14 VITALS — BP 118/64 | HR 68 | Temp 98.2°F | Resp 14 | Ht 66.5 in | Wt 122.8 lb

## 2014-06-14 DIAGNOSIS — E1041 Type 1 diabetes mellitus with diabetic mononeuropathy: Secondary | ICD-10-CM

## 2014-06-14 DIAGNOSIS — IMO0002 Reserved for concepts with insufficient information to code with codable children: Secondary | ICD-10-CM

## 2014-06-14 DIAGNOSIS — E1065 Type 1 diabetes mellitus with hyperglycemia: Principal | ICD-10-CM

## 2014-06-14 DIAGNOSIS — E1049 Type 1 diabetes mellitus with other diabetic neurological complication: Secondary | ICD-10-CM

## 2014-06-14 DIAGNOSIS — E875 Hyperkalemia: Secondary | ICD-10-CM

## 2014-06-14 NOTE — Progress Notes (Signed)
Patient ID: Benjamin Hodge, male   DOB: Sep 26, 1940, 73 y.o.   MRN: [id number]   Reason for Appointment : Follow up for Type 1 Diabetes  History of Present Illness          Diagnosis: Type 1 diabetes mellitus, date of diagnosis: 1986         Past history: Patient has had very labile diabetes and generally difficult to control for several years. He had previously been tried on an Accu-Chek pump also which did not appear to be unofficial and he was not very keen on continuing Has been tried on basal bolus insulin regimen for several years. His insulin has been changed continuously without achieving good control. Has been tried on Levemir instead of Lantus without any significant benefit. Also because of tendency to markedly increased blood sugars overnight he has been started on NPH insulin at bedtime along with his twice a day   INSULIN regimen is described as: LANTUS 7 units at breakfast. Humulin N 3 at bedtime,   Humalog PC. 1:20 carbohydrate coverage, usually 3 in am and 5 at supper;  correction 1:50-100  Recent history:  On his last visit in 8/15 he was advised to start taking Humalog coverage for any snacks and drinks he would be having in the evenings especially his mixed drinks.  Despite written instructions he did not follow these instructions even though he was able to find out how much carbohydrates he needs to cover for these drinks Also has not follow instructions for covering high-fat meals with extra insulin at 1:15 ratio A1c is higher than usual Also Lantus was increased by one unit because of relatively higher fasting readings  Glucose patterns and problems identified:  Fasting blood sugars are mostly high although he has had a few good readings also  Blood sugars are usually variable after breakfast depending on the type of meal but somewhat lower around 6 PM and variable later in the evening  He has had a tendency to low sugars around 10-11 PM before he has a meal or  snack  His blood sugars are generally markedly increased before his dinner and his noticing this when he has 2-3 margaritas  His blood sugars are not as high as late suppertime when he has less alcohol and only scotch whiskey   Overall median blood sugar the last 2 weeks is significantly high at 233 although relatively better than his last visit  He takes his Humalog after eating because he can estimate his carbohydrate intake  better, sometimes eating desserts No hypoglycemia recently He is not using his continuous glucose monitor because of the expense  PREMEAL Breakfast  7-8 PM   ACS   10-11 PM  Overall  Glucose range:  99-299   119-160   127-601   61-421    Mean/median:  180   380  73  233   POST-MEAL PC Breakfast PC Lunch PC Dinner  Glucose range:  68-270     Mean/median:      Self-care: The diet that the patient has been following is: Usually eating frozen meal at dinnertime which is relatively high fat  Meals: 2 meals per day.  breakfast maybe high fat with fruit, usually 60 g carb am and 100g at dinner  Physical activity: exercise: No programmed activity.           Wt Readings from Last 3 Encounters:  06/14/14 122 lb 12.8 oz (55.702 kg)  06/06/14 122 lb (55.339 kg)  05/03/14  120 lb (54.432 kg)   Lab Results  Component Value Date   HGBA1C 8.4* 06/05/2014   HGBA1C 7.0* 04/26/2014   HGBA1C 6.4* 03/11/2014   Lab Results  Component Value Date   MICROALBUR 2.0* 11/27/2013   LDLCALC 69 06/05/2014   CREATININE 0.98 06/05/2014       Medication List       This list is accurate as of: 06/14/14  9:21 PM.  Always use your most recent med list.               aspirin 81 MG tablet  Take 81 mg by mouth daily.     clopidogrel 75 MG tablet  Commonly known as:  PLAVIX  Take 75 mg by mouth daily with breakfast. Blood Thinner     diphenoxylate-atropine 2.5-0.025 MG per tablet  Commonly known as:  LOMOTIL  Take 2 tablets by mouth 3 (three) times daily as needed for diarrhea or  loose stools.     escitalopram 20 MG tablet  Commonly known as:  LEXAPRO     HYDROcodone-acetaminophen 5-325 MG per tablet  Commonly known as:  NORCO/VICODIN  Take 1 tablet by mouth 5 (five) times daily as needed for moderate pain.     Insulin Glargine 100 UNIT/ML Solostar Pen  Commonly known as:  LANTUS  Take 6 units in the morning     insulin lispro 100 UNIT/ML KiwkPen  Commonly known as:  HUMALOG  Inject 1-6 Units into the skin daily as needed (after meals).     Insulin NPH (Human) (Isophane) 100 UNIT/ML Kiwkpen  Commonly known as:  HUMULIN N  Inject 3 Units into the skin every evening.     Insulin Pen Needle 32G X 4 MM Misc  Commonly known as:  BD PEN NEEDLE NANO U/F  1 each by Does not apply route 3 (three) times daily.     levothyroxine 75 MCG tablet  Commonly known as:  SYNTHROID, LEVOTHROID  TAKE ONE TABLET BY MOUTH ONE TIME DAILY FOR THYROID     methylphenidate 20 MG tablet  Commonly known as:  RITALIN  Take 20 mg by mouth daily.     metoprolol tartrate 25 MG tablet  Commonly known as:  LOPRESSOR  Take 1 tablet (25 mg total) by mouth 2 (two) times daily.     ONE TOUCH ULTRA TEST test strip  Generic drug:  glucose blood  TEST 8 TIMES PER DAY FOR 30 DAYS     pantoprazole 40 MG tablet  Commonly known as:  PROTONIX  Take 1 tablet (40 mg total) by mouth daily.     pregabalin 50 MG capsule  Commonly known as:  LYRICA  Take 50-100 mg by mouth at bedtime.     testosterone cypionate 200 MG/ML injection  Commonly known as:  DEPOTESTOTERONE CYPIONATE  Inject 200 mg into the muscle every 14 (fourteen) days.     vitamin B-12 1000 MCG tablet  Commonly known as:  CYANOCOBALAMIN  Take 1,000 mcg by mouth daily.        Allergies:  Allergies  Allergen Reactions  . Ace Inhibitors Other (See Comments)    dizzy  . Angiotensin Receptor Blockers Other (See Comments)    unknown  . Bee Venom Swelling  . Cymbalta [Duloxetine Hcl] Other (See Comments)    dizzy     Past Medical History  Diagnosis Date  . Shoulder pain, left   . Malnutrition 11/28/2008  . Right upper lobe pneumonia 11/28/2008  . Anxiety 07/18/2008  .  Irritable bowel syndrome 04/27/2008  . Nocturia 03/13/2008  . COPD (chronic obstructive pulmonary disease) 02/27/2008  . B12 deficiency 02/16/2008    in 5/09: B12 262, MMA 1040  . Chronic diarrhea 01/01/2008    s/p EGD 10/06: H pylori + gastritis, duodenal biopsy normal (Dr. Elnoria Howard); s/p colonoscopy 10/06: hemorrhoids (Dr. Elnoria Howard); evaluated by Dr. Yancey Flemings  in 8/09: tissue transglutaminase AB negative, VIP normal, stool fat content normal, urine 5-HIAA normal; normal GES 11/09 (done for "fluctuating sugars")  . 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 Leg pain, right 09/29/2007  . Fatigue 02/28/2007  . 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 Hyperkalemia 02/09/2007  . Orthostatic hypotension 02/09/2007  . Hypertension 02/09/2007  . Spinal stenosis in cervical region 05/03    MRI  . Erectile dysfunction 09/27/2006    s/p penile implant  . Anemia, iron deficiency 09/27/2006    neg colonoscopy 2006 - Dr. Elnoria Howard; ferritin 152; hgb  15.7  on 07/07  . Hypothyroidism 09/27/2006    TSH 2.639  07/07  . Hypersomnia 09/27/2006    evaluated by Dr. Jetty Duhamel (5/08) and Dr. Vickey Huger; PSG 8/09: chronic delayed sleep phase syndrome (patient sleeps during the day and is awake at night), nocturnal myoclonus (eval for RLS and IDA suggested). Pt advised to change sleeping behavior  . Diabetes mellitus type II 09/07/1984    poorly controlled, complicated by peripheral neuropathy, microalbuminuria, mild non proliferative retinopathy, s/p DKA 7/05, on insulin pump x 4/09, started a pump vacation on 11/19/2008  . Hyperlipidemia   . 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 Hemorrhoids   . Chronic kidney disease   . Unspecified hereditary and idiopathic peripheral neuropathy   . Hyperpotassemia   . Other testicular hypofunction    . Impotence of organic origin   . Other chronic pain   . Other malaise and fatigue   . Unspecified hypothyroidism   . Type II or unspecified type diabetes mellitus with neurological manifestations, not stated as uncontrolled   . Other B-complex deficiencies   . Other organic hypersomnia   . Type I (juvenile type) diabetes mellitus without mention of complication, not stated as uncontrolled   . Depression   . Attention deficit disorder with hyperactivity(314.01)   . Hypertrophy of prostate without urinary obstruction and other lower urinary tract symptoms (LUTS)   . Lumbago   . ADHD, adult residual type 05/31/2013  . Type I (juvenile type) diabetes mellitus with neurological manifestations, uncontrolled 09/27/2006    Annotation: HBA1C 8.2 11/07. microalb. creat ratio 71 on 03/07, decreased  sensation right foot causing cellulitis 09/06 Qualifier: Diagnosis of  By: Renae Fickle MD, Bhakti      Past Surgical History  Procedure Laterality Date  . Penile prosthesis implant    . Tonsillectomy    . Colonoscopy  06/25/2005    Dr. Jeani Hawking    Family History  Problem Relation Age of Onset  . Bone cancer Mother   . Breast cancer Mother   . Cancer Mother   . Lung cancer Father   . Cancer Father   . Breast cancer Sister   . Cancer Sister   . Lung cancer Brother   . Cancer Sister     type unknown    Social History:  reports that he quit smoking about 9 years ago. His smoking use included Cigarettes. He smoked 0.00 packs per day. He has never used smokeless tobacco. He reports that he does not drink alcohol or use illicit drugs.    Review of  Systems:   Weight loss: He has a gained a couple of pounds  His potassium is higher again. Not on any ACE inhibitors or ARB drugs Does not have any significant hypertension  ANEMIA: His hemoglobin  Has been low and this is followed by hematologist   Lab Results  Component Value Date   WBC 7.2 04/03/2014   HGB 10.7* 04/03/2014   HCT 33.1*  04/03/2014   MCV 103.1* 04/03/2014   PLT 199 04/03/2014    He has been diagnosed to have hypogonadism and is  being treated by his urologist with injections every 2 weeks  History of chronic diarrhea of unclear etiology   Has had BPH with significant nocturia   History of hypothyroidism, currently taking 75 mcg   Lab Results  Component Value Date   TSH 1.66 04/26/2014   Has good control of his lipids  Lab Results  Component Value Date   CHOL 105 03/11/2014   HDL 54 06/05/2014   LDLCALC 69 06/05/2014   TRIG 72 06/05/2014   CHOLHDL 2.5 06/05/2014    Physical Examination:  BP 118/64  Pulse 68  Temp(Src) 98.2 F (36.8 C)  Resp 14  Ht 5' 6.5" (1.689 m)  Wt 122 lb 12.8 oz (55.702 kg)  BMI 19.53 kg/m2  SpO2 97%         ASSESSMENT/PLAN:   Diabetes type 1:   See history of present illness for detailed discussion of his current blood sugar patterns, insulin regimen and problems identified His blood sugars are markedly increased around his dinnertime and is clearly is related to having mixed alcoholic drinks that he has not been covering with extra Humalog as directed and he does not understand how to do this despite knowing how much carbohydrates they contain He also has appeared to have higher fasting readings recently although not consistent Has tendency to relatively low readings about 7-8 hours after his breakfast  A1c is higher than usual  His overall control is limited by his tendency to severe hypoglycemia and hypoglycemia unawareness  Since fasting readings are mostly high  and he had relatively lower readings later in the day he will need to split his Lantus twice a day again Insulin doses as an instructions However his major insulin requirement will be for his meals and  alcoholic drinks Discussed how to cover his drinks and also adjust the dose based on readings before supper  May also recommend that he be referred for counseling by his PCP for inducing alcoholic intake   He is still waiting  to get the insulin pump with a sensor from [employer]   HYPERKALEMIA: this is partly related to hypertension content of his mixed alcoholic drinks and advised him to reduce this. If he has any orthostatic hypotension may consider Florinef  Patient Instructions  LANTUS 5 UNITS IN AM AND 3 UNITS AT 10 PM  Add 1 unit extra Humalog per 10 grams of fat   Cover all alcohol CARBS with Humalog 1unit per 15g Carbs          Counseling time over 50% of today's 25 minute visit  Mariska Daffin 06/14/2014, 9:21 PM

## 2014-06-14 NOTE — Patient Instructions (Addendum)
LANTUS 5 UNITS IN AM AND 3 UNITS AT 10 PM  Add 1 unit extra Humalog per 10 grams of fat   Cover all alcohol CARBS with Humalog 1unit per 15g Carbs

## 2014-06-25 ENCOUNTER — Telehealth: Payer: Self-pay | Admitting: Neurology

## 2014-06-25 MED ORDER — METHYLPHENIDATE HCL 20 MG PO TABS: 20.0000 mg | ORAL_TABLET | Freq: Two times a day (BID) | ORAL | Status: DC

## 2014-06-25 NOTE — Telephone Encounter (Signed)
Request forwarded to provider for approval  

## 2014-06-25 NOTE — Telephone Encounter (Signed)
Patient is calling to get a written Rx for Methylthenida [facility]. Please call when ready for pick up. Thank you.

## 2014-06-25 NOTE — Telephone Encounter (Signed)
I scheduled an appointment  with Dr. Vickey Huger on 07-10-14 for patient. Thank you.

## 2014-06-25 NOTE — Telephone Encounter (Signed)
Patient has not been seen in one year.  I called back, got no answer.  Left message asking that they please call back to schedule appt.

## 2014-06-26 NOTE — Telephone Encounter (Signed)
I called the patient to let them know their Rx for Ritalin 20mg  was ready for pickup. I had to leave a message.  Patient was instructed to bring Photo ID.

## 2014-06-28 ENCOUNTER — Ambulatory Visit: Payer: Medicare Other | Admitting: Internal Medicine

## 2014-07-02 ENCOUNTER — Other Ambulatory Visit: Payer: Self-pay | Admitting: Internal Medicine

## 2014-07-03 ENCOUNTER — Other Ambulatory Visit: Payer: Self-pay | Admitting: Internal Medicine

## 2014-07-05 ENCOUNTER — Other Ambulatory Visit: Payer: Self-pay | Admitting: *Deleted

## 2014-07-05 MED ORDER — HYDROCODONE-ACETAMINOPHEN 5-325 MG PO TABS: 1.0000 | ORAL_TABLET | Freq: Every day | ORAL | Status: DC | PRN

## 2014-07-05 NOTE — Telephone Encounter (Signed)
Patient walked in and requested. 

## 2014-07-06 ENCOUNTER — Ambulatory Visit (INDEPENDENT_AMBULATORY_CARE_PROVIDER_SITE_OTHER): Payer: Medicare Other | Admitting: Cardiology

## 2014-07-06 ENCOUNTER — Encounter: Payer: Self-pay | Admitting: Cardiology

## 2014-07-06 VITALS — BP 142/68 | HR 61 | Ht 66.0 in | Wt 121.0 lb

## 2014-07-06 DIAGNOSIS — I2510 Atherosclerotic heart disease of native coronary artery without angina pectoris: Secondary | ICD-10-CM

## 2014-07-06 DIAGNOSIS — I2583 Coronary atherosclerosis due to lipid rich plaque: Secondary | ICD-10-CM

## 2014-07-06 DIAGNOSIS — I10 Essential (primary) hypertension: Secondary | ICD-10-CM

## 2014-07-06 MED ORDER — PRAVASTATIN SODIUM 20 MG PO TABS: 20.0000 mg | ORAL_TABLET | Freq: Every evening | ORAL | Status: DC

## 2014-07-06 NOTE — Patient Instructions (Signed)
Your physician recommends that you schedule a follow-up appointment in: 2 months with Dr. Antoine Poche  We have put in an order for Cardiac Rehab   Keep a Blood Pressure Diary  Dr. Antoine Poche wants you to see Dr. Sharl Ma as an Endocrinologist we will have them call you with an appt.

## 2014-07-06 NOTE — Progress Notes (Signed)
HPI The patient presents for evaluation of coronary artery disease. He was in the hospital with hyperglycemia this summer and was found to rule in for non-Q-wave myocardial infarction.  He has three-vessel coronary disease as described below this was managed medically. Since going home had no new cardiovascular complaints but he never had chest discomfort. He does have some shortness of breath but this seems to be related to changes in season. There is no association with activity. It is not associated with other symptoms such as not see a vomiting or diaphoresis. He does not have palpitations, presyncope or syncope. He does not have PND or orthopnea. He does have adult  onset type 1 diabetes very difficult to control.   He has had presyncope and near syncope with orthostasis recently.    Allergies  Allergen Reactions  . Ace Inhibitors Other (See Comments)    dizzy  . Angiotensin Receptor Blockers Other (See Comments)    unknown  . Bee Venom Swelling  . Cymbalta [Duloxetine Hcl] Other (See Comments)    dizzy    Current Outpatient Prescriptions  Medication Sig Dispense Refill  . aspirin 81 MG tablet Take 81 mg by mouth daily.       . clopidogrel (PLAVIX) 75 MG tablet Take 75 mg by mouth daily with breakfast. Blood Thinner      . diphenoxylate-atropine (LOMOTIL) 2.5-0.025 MG per tablet TAKE ONE OR TWO TABLETS BY MOUTH TWICE DAILY AS NEEDED   60 tablet  0  . escitalopram (LEXAPRO) 20 MG tablet       . HYDROcodone-acetaminophen (NORCO/VICODIN) 5-325 MG per tablet Take 1 tablet by mouth 5 (five) times daily as needed for moderate pain.  150 tablet  0  . Insulin Glargine (LANTUS) 100 UNIT/ML Solostar Pen Take 6 units in the morning      . Insulin Isophane Human (HUMULIN N) 100 UNIT/ML Kiwkpen Inject 3 Units into the skin every evening.       . insulin lispro (HUMALOG) 100 UNIT/ML KiwkPen Inject 1-6 Units into the skin daily as needed (after meals).       . Insulin Pen Needle (BD PEN NEEDLE  NANO U/F) 32G X 4 MM MISC 1 each by Does not apply route 3 (three) times daily.  100 each  5  . levothyroxine (SYNTHROID, LEVOTHROID) 75 MCG tablet TAKE ONE TABLET BY MOUTH ONE TIME DAILY FOR THYROID   30 tablet  5  . methylphenidate (RITALIN) 20 MG tablet Take 1 tablet (20 mg total) by mouth 2 (two) times daily.  180 tablet  0  . metoprolol tartrate (LOPRESSOR) 25 MG tablet Take 1 tablet (25 mg total) by mouth 2 (two) times daily.  60 tablet  5  . ONE TOUCH ULTRA TEST test strip TEST 8 TIMES PER DAY FOR 30 DAYS  250 each  4  . pantoprazole (PROTONIX) 40 MG tablet Take 1 tablet (40 mg total) by mouth daily.  30 tablet  5  . pregabalin (LYRICA) 50 MG capsule Take 50-100 mg by mouth at bedtime.      . vitamin B-12 (CYANOCOBALAMIN) 1000 MCG tablet Take 1,000 mcg by mouth daily.       No current facility-administered medications for this visit.    Past Medical History  Diagnosis Date  . CAD (coronary artery disease)     LAD 40-50% stenosis, first diagonal small 90% stenosis, circumflex 70% stenosis, OM 80% stenosis, right coronary artery 80-90% stenosis.  . Right upper lobe pneumonia 11/28/2008  .  Anxiety 07/18/2008  . Irritable bowel syndrome 04/27/2008  . COPD (chronic obstructive pulmonary disease) 02/27/2008  . B12 deficiency 02/16/2008    in 5/09: B12 262, MMA 1040  . Chronic diarrhea 01/01/2008    s/p EGD 10/06: H pylori + gastritis, duodenal biopsy normal (Dr. Elnoria Howard); s/p colonoscopy 10/06: hemorrhoids (Dr. Elnoria Howard); evaluated by Dr. Yancey Flemings  in 8/09: tissue transglutaminase AB negative, VIP normal, stool fat content normal, urine 5-HIAA normal; normal GES 11/09 (done for "fluctuating sugars")  . 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 Orthostatic hypotension 02/09/2007  . Hypertension 02/09/2007  . Spinal stenosis in cervical region 05/03    MRI  . Erectile dysfunction 09/27/2006    s/p penile implant  . Anemia, iron deficiency 09/27/2006    neg colonoscopy  2006 - Dr. Elnoria Howard; ferritin 152; hgb  15.7  on 07/07  . Hypothyroidism 09/27/2006    TSH 2.639  07/07  . Hypersomnia 09/27/2006    evaluated by Dr. Jetty Duhamel (5/08) and Dr. Vickey Huger; PSG 8/09: chronic delayed sleep phase syndrome (patient sleeps during the day and is awake at night), nocturnal myoclonus (eval for RLS and IDA suggested). Pt advised to change sleeping behavior  . Diabetes mellitus type II 09/07/1984    poorly controlled, complicated by peripheral neuropathy, microalbuminuria, mild non proliferative retinopathy, s/p DKA 7/05, on insulin pump x 4/09, started a pump vacation on 11/19/2008  . Hyperlipidemia   . 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 Hemorrhoids   . Chronic kidney disease   . Unspecified hereditary and idiopathic peripheral neuropathy   . Other chronic pain   . Other malaise and fatigue   . Depression   . Hypertrophy of prostate without urinary obstruction and other lower urinary tract symptoms (LUTS)   . Lumbago     Past Surgical History  Procedure Laterality Date  . Penile prosthesis implant    . Tonsillectomy    . Colonoscopy  06/25/2005    Dr. Jeani Hawking    ROS:  Orthostatic hypotension.  As stated in the HPI and negative for all other systems.  PHYSICAL EXAM BP 142/68  Pulse 61  Ht 5\' 6"  (1.676 m)  Wt 121 lb (54.885 kg)  BMI 19.54 kg/m2 GENERAL:  Well appearing HEENT:  Pupils equal round and reactive, fundi not visualized, oral mucosa unremarkable NECK:  No jugular venous distention, waveform within normal limits, carotid upstroke brisk and symmetric, right bruits, no thyromegaly LYMPHATICS:  No cervical, inguinal adenopathy LUNGS:  Clear to auscultation bilaterally BACK:  No CVA tenderness CHEST:  Unremarkable HEART:  PMI not displaced or sustained,S1 and S2 within normal limits, no S3, no S4, no clicks, no rubs, no murmurs ABD:  Flat, positive bowel sounds normal in frequency in pitch, no bruits, no rebound, no guarding, no midline pulsatile mass,  no hepatomegaly, no splenomegaly EXT:  2 plus pulses throughout, no edema, no cyanosis no clubbing SKIN:  No rashes no nodules NEURO:  Cranial nerves II through XII grossly intact, motor grossly intact throughout PSYCH:  Cognitively intact, oriented to person place and time  EKG:  Sinus rhythm, rate 61, axis within normal limits, intervals within normal limits, left ventricle hypertrophy by voltage criteria, no acute ST-T wave changes.  ASSESSMENT AND PLAN  CAD:  We will be aggressive with medical management. He still not having any symptoms so I don't think intervention is indicated. I would like him to this being cardiac rehabilitation. Other risk factors are addressed below.  DYSLIPIDEMIA:  The patient has  an excellent lipid profile.  However, he would statistically benefit from a statin.    DIABETES:  We discussed strategies for diabetes control.  Lab Results  Component Value Date   HGBA1C 8.4* 06/05/2014   Lab Results  Component Value Date   CHOL 105 03/11/2014   TRIG 72 06/05/2014   HDL 54 06/05/2014   LDLCALC 69 06/05/2014   ORTHOSTATIC HYPOTENSION:   We discussed strategies to avoid symptoms.

## 2014-07-10 ENCOUNTER — Other Ambulatory Visit: Payer: Self-pay | Admitting: Endocrinology

## 2014-07-10 ENCOUNTER — Ambulatory Visit (INDEPENDENT_AMBULATORY_CARE_PROVIDER_SITE_OTHER): Payer: Medicare Other | Admitting: Neurology

## 2014-07-10 ENCOUNTER — Encounter: Payer: Self-pay | Admitting: Neurology

## 2014-07-10 VITALS — BP 135/68 | HR 64 | Temp 98.0°F | Resp 14 | Ht 66.0 in | Wt 120.0 lb

## 2014-07-10 DIAGNOSIS — G901 Familial dysautonomia [Riley-Day]: Secondary | ICD-10-CM

## 2014-07-10 DIAGNOSIS — G629 Polyneuropathy, unspecified: Secondary | ICD-10-CM

## 2014-07-10 DIAGNOSIS — E1140 Type 2 diabetes mellitus with diabetic neuropathy, unspecified: Secondary | ICD-10-CM

## 2014-07-10 DIAGNOSIS — E1149 Type 2 diabetes mellitus with other diabetic neurological complication: Principal | ICD-10-CM

## 2014-07-10 DIAGNOSIS — G909 Disorder of the autonomic nervous system, unspecified: Secondary | ICD-10-CM

## 2014-07-10 MED ORDER — METHYLPHENIDATE HCL 20 MG PO TABS: 20.0000 mg | ORAL_TABLET | Freq: Two times a day (BID) | ORAL | Status: DC

## 2014-07-10 MED ORDER — METOCLOPRAMIDE HCL 5 MG PO TABS: ORAL_TABLET | ORAL | Status: DC

## 2014-07-10 NOTE — Patient Instructions (Signed)
Metoclopramide tablets What is this medicine? METOCLOPRAMIDE (met oh kloe PRA mide) is used to treat the symptoms of gastroesophageal reflux disease (GERD) like heartburn. It is also used to treat people with slow emptying of the stomach and intestinal tract. This medicine may be used for other purposes; ask your health care provider or pharmacist if you have questions. COMMON BRAND NAME(S): Reglan What should I tell my health care provider before I take this medicine? They need to know if you have any of these conditions: -breast cancer -depression -diabetes -heart failure -high blood pressure -kidney disease -liver disease -Parkinson's disease or a movement disorder -pheochromocytoma -seizures -stomach obstruction, bleeding, or perforation -an unusual or allergic reaction to metoclopramide, procainamide, sulfites, other medicines, foods, dyes, or preservatives -pregnant or trying to get pregnant -breast-feeding How should I use this medicine? Take this medicine by mouth with a glass of water. Follow the directions on the prescription label. Take this medicine on an empty stomach, about 30 minutes before eating. Take your doses at regular intervals. Do not take your medicine more often than directed. Do not stop taking except on the advice of your doctor or health care professional. A special MedGuide will be given to you by the pharmacist with each prescription and refill. Be sure to read this information carefully each time. Talk to your pediatrician regarding the use of this medicine in children. Special care may be needed. Overdosage: If you think you have taken too much of this medicine contact a poison control center or emergency room at once. NOTE: This medicine is only for you. Do not share this medicine with others. What if I miss a dose? If you miss a dose, take it as soon as you can. If it is almost time for your next dose, take only that dose. Do not take double or extra  doses. What may interact with this medicine? -acetaminophen -cyclosporine -digoxin -medicines for blood pressure -medicines for diabetes, including insulin -medicines for hay fever and other allergies -medicines for depression, especially an Monoamine Oxidase Inhibitor (MAOI) -medicines for Parkinson's disease, like levodopa -medicines for sleep or for pain -tetracycline This list may not describe all possible interactions. Give your health care provider a list of all the medicines, herbs, non-prescription drugs, or dietary supplements you use. Also tell them if you smoke, drink alcohol, or use illegal drugs. Some items may interact with your medicine. What should I watch for while using this medicine? It may take a few weeks for your stomach condition to start to get better. However, do not take this medicine for longer than 12 weeks. The longer you take this medicine, and the more you take it, the greater your chances are of developing serious side effects. If you are an elderly patient, a male patient, or you have diabetes, you may be at an increased risk for side effects from this medicine. Contact your doctor immediately if you start having movements you cannot control such as lip smacking, rapid movements of the tongue, involuntary or uncontrollable movements of the eyes, head, arms and legs, or muscle twitches and spasms. Patients and their families should watch out for worsening depression or thoughts of suicide. Also watch out for any sudden or severe changes in feelings such as feeling anxious, agitated, panicky, irritable, hostile, aggressive, impulsive, severely restless, overly excited and hyperactive, or not being able to sleep. If this happens, especially at the beginning of treatment or after a change in dose, call your doctor. Do not treat yourself  for high fever. Ask your doctor or health care professional for advice. °You may get drowsy or dizzy. Do not drive, use machinery, or  do anything that needs mental alertness until you know how this drug affects you. Do not stand or sit up quickly, especially if you are an older patient. This reduces the risk of dizzy or fainting spells. Alcohol can make you more drowsy and dizzy. Avoid alcoholic drinks. °What side effects may I notice from receiving this medicine? °Side effects that you should report to your doctor or health care professional as soon as possible: °-allergic reactions like skin rash, itching or hives, swelling of the face, lips, or tongue °-abnormal production of milk in females °-breast enlargement in both males and females °-change in the way you walk °-difficulty moving, speaking or swallowing °-drooling, lip smacking, or rapid movements of the tongue °-excessive sweating °-fever °-involuntary or uncontrollable movements of the eyes, head, arms and legs °-irregular heartbeat or palpitations °-muscle twitches and spasms °-unusually weak or tired °Side effects that usually do not require medical attention (report to your doctor or health care professional if they continue or are bothersome): °-change in sex drive or performance °-depressed mood °-diarrhea °-difficulty sleeping °-headache °-menstrual changes °-restless or nervous °This list may not describe all possible side effects. Call your doctor for medical advice about side effects. You may report side effects to FDA at 1-800-FDA-1088. °Where should I keep my medicine? °Keep out of the reach of children. °Store at room temperature between 20 and 25 degrees C (68 and 77 degrees F). Protect from light. Keep container tightly closed. Throw away any unused medicine after the expiration date. °NOTE: This sheet is a summary. It may not cover all possible information. If you have questions about this medicine, talk to your doctor, pharmacist, or health care provider. °© 2015, [employer]. (2011-12-22 13:04:38) ° °

## 2014-07-10 NOTE — Progress Notes (Signed)
[facility]  Provider:  Larey Seat, M D  Referring Provider: Gayland Curry, DO Primary Care Physician:  Hollace Kinnier, DO  Chief Complaint  Patient presents with  . RV sleep    Rm 11, Alone    HPI:  Benjamin Hodge is a 73 y.o. male ,seen here as a  revisit  from Dr. Mariea Clonts for hypersomnia, circadian rhythm disorder, inability to stay awake . Marland Kitchen  Last visit ;  Benjamin Hodge has trouble to keep awake, will have irregular sleep and wake rhythm.  We have frequently discussed sleep hygiene, and the need to avoid naps in daytime , but he still drinks caffeine .  He feels that caffeine not longer helps him to stay awake, he will "crash and burn "  and will get  fatigued , cannot safely drive - but will not fall asleep.  He crosses a point of not being able to sleep, but craving sleep. 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 This work does allow him not to fall asleep, since he is moving. By 21 hours He feels often paradoxically stimulated , a second wind" and he has  No trouble to stay awake until 2 AM- sleeps until 10 AM. Delayed sleep phase. He is not a free running circadian patient.  Benjamin Hodge and used a very interesting sentence today "The hardest thing I do in the day is getting up out of bed in the morning " , " the second hardest thing is finding my way to bed in the evening". The patient's past medical history also includes diabetic neuropathy, he had a normal echocardiogram in May 2011 ordered studies and 2012. He was worked up for possible TIA after he developed left facial and arm numbness , which resolved.  at the local emergency room. He has been doing well on Ritalin,  which has been allowing him to control his sleepiness somewhat. His employment situation has given him some freedom and were hours. He has a chronic anemia. The patient had a trial of testosterone replacement, and he seems to recall that  it actually helped his fatigue and he felt more energized after that. He will see the prescribing doctor and the near future. EDS- Epworth score  5-6 points, FSS  24 , GDS 2 points.    Interval history 07-10-14, He works week ends,  enjoys having still a purpose , something to go to.  He would like to work from 10 AM on, has trouble to get up earlier. He has trouble to sleep earlier than midnight.  Has made a conscious effort to advance his sleep time from 2-3 AM , but can function with sleep from midnight to 9 AM.   He would like to work Monday to Friday, but feels to old and impaired by diabetes.    Review of Systems: Out of a complete 14 system review, the patient complains of only the following symptoms, and all other reviewed systems are negative. Sleep and wake rhythm disorder.  Adult ADHD depression   Diabetes, Diabetic neuropathy.    History   Social History  . Marital Status: Divorced    Spouse Name: [name]    Number of Children: 1  . Years of Education: 15   Occupational History  . [employer]    Social History Main Topics  . Smoking status: Former Smoker    Types: Cigarettes    Quit  date: 09/28/2004  . Smokeless tobacco: Never Used  . Alcohol Use: 0.0 oz/week    0 Not specified per week     Comment: occ/social (moderate)  . Drug Use: No  . Sexual Activity: Not on file   Other Topics Concern  . Not on file   Social History Narrative   Patient is right handed, quit caffeinated beverages in 2005.   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 He is divorced.    Family History  Problem Relation Age of Onset  . Bone cancer Mother   . Breast cancer Mother   . Cancer Mother   . Lung cancer Father   . Cancer Father   . Breast cancer Sister   . Cancer Sister   . Lung cancer Brother   . Cancer Sister     type unknown    Past Medical History  Diagnosis Date  . CAD (coronary artery disease)     LAD 40-50% stenosis, first diagonal small 90%  stenosis, circumflex 70% stenosis, OM 80% stenosis, right coronary artery 80-90% stenosis.  . Right upper lobe pneumonia 11/28/2008  . Anxiety 07/18/2008  . Irritable bowel syndrome 04/27/2008  . COPD (chronic obstructive pulmonary disease) 02/27/2008  . B12 deficiency 02/16/2008    in 5/09: B12 262, MMA 1040  . Chronic diarrhea 01/01/2008    s/p EGD 10/06: H pylori + gastritis, duodenal biopsy normal (Dr. Benson Norway); s/p colonoscopy 10/06: hemorrhoids (Dr. Benson Norway); evaluated by Dr. Scarlette Shorts  in 8/09: tissue transglutaminase AB negative, VIP normal, stool fat content normal, urine 5-HIAA normal; normal GES 11/09 (done for "fluctuating sugars")  . 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 Orthostatic hypotension 02/09/2007  . Hypertension 02/09/2007  . Spinal stenosis in cervical region 05/03    MRI  . Erectile dysfunction 09/27/2006    s/p penile implant  . Anemia, iron deficiency 09/27/2006    neg colonoscopy 2006 - Dr. Benson Norway; ferritin 152; hgb  15.7  on 07/07  . Hypothyroidism 09/27/2006    TSH 2.639  07/07  . Hypersomnia 09/27/2006    evaluated by Dr. Baird Lyons (5/08) and Dr. Brett Fairy; PSG 8/09: chronic delayed sleep phase syndrome (patient sleeps during the day and is awake at night), nocturnal myoclonus (eval for RLS and IDA suggested). Pt advised to change sleeping behavior  . Diabetes mellitus type II 09/07/1984    poorly controlled, complicated by peripheral neuropathy, microalbuminuria, mild non proliferative retinopathy, s/p DKA 7/05, on insulin pump x 4/09, started a pump vacation on 11/19/2008  . Hyperlipidemia   . 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 Hemorrhoids   . Chronic kidney disease   . Unspecified hereditary and idiopathic peripheral neuropathy   . Other chronic pain   . Other malaise and fatigue   . Depression   . Hypertrophy of prostate without urinary obstruction and other lower urinary tract symptoms (LUTS)   . Lumbago   . Heart attack 03/2014     mild    Past Surgical History  Procedure Laterality Date  . Penile prosthesis implant    . Tonsillectomy    . Colonoscopy  06/25/2005    Dr. Carol Ada    Current Outpatient Prescriptions  Medication Sig Dispense Refill  . aspirin 81 MG tablet Take 81 mg by mouth daily.     . clopidogrel (PLAVIX) 75 MG tablet Take 75 mg by mouth daily with breakfast. Blood Thinner    . Continuous Glucose Monitor (DEXCOM G4 PLATINUM RECEIVER) KIT USE AS DIRECTED 1  kit 1  . diphenoxylate-atropine (LOMOTIL) 2.5-0.025 MG per tablet TAKE ONE OR TWO TABLETS BY MOUTH TWICE DAILY AS NEEDED  60 tablet 0  . escitalopram (LEXAPRO) 20 MG tablet Take 20 mg by mouth daily.     Marland Kitchen HYDROcodone-acetaminophen (NORCO/VICODIN) 5-325 MG per tablet Take 1 tablet by mouth 5 (five) times daily as needed for moderate pain. 150 tablet 0  . Insulin Glargine (LANTUS) 100 UNIT/ML Solostar Pen Take 6 units in the morning    . Insulin Isophane Human (HUMULIN N) 100 UNIT/ML Kiwkpen Inject 3 Units into the skin every evening.     . insulin lispro (HUMALOG) 100 UNIT/ML KiwkPen Inject 1-6 Units into the skin daily as needed (after meals).     . Insulin Pen Needle (BD PEN NEEDLE NANO U/F) 32G X 4 MM MISC 1 each by Does not apply route 3 (three) times daily. 100 each 5  . levothyroxine (SYNTHROID, LEVOTHROID) 75 MCG tablet TAKE ONE TABLET BY MOUTH ONE TIME DAILY FOR THYROID  30 tablet 5  . methylphenidate (RITALIN) 20 MG tablet Take 1 tablet (20 mg total) by mouth 2 (two) times daily. 180 tablet 0  . metoprolol tartrate (LOPRESSOR) 25 MG tablet Take 1 tablet (25 mg total) by mouth 2 (two) times daily. 60 tablet 5  . ONE TOUCH ULTRA TEST test strip TEST 8 TIMES PER DAY FOR 30 DAYS 250 each 4  . pantoprazole (PROTONIX) 40 MG tablet Take 1 tablet (40 mg total) by mouth daily. 30 tablet 5  . pravastatin (PRAVACHOL) 20 MG tablet Take 1 tablet (20 mg total) by mouth every evening. 90 tablet 3  . pregabalin (LYRICA) 50 MG capsule Take 50-100 mg  by mouth at bedtime.    . vitamin B-12 (CYANOCOBALAMIN) 1000 MCG tablet Take 1,000 mcg by mouth daily.     No current facility-administered medications for this visit.    Allergies as of 07/10/2014 - Review Complete 07/10/2014  Allergen Reaction Noted  . Lipitor [atorvastatin] Other (See Comments) 07/10/2014  . Ace inhibitors Other (See Comments) 01/23/2013  . Angiotensin receptor blockers Other (See Comments) 01/23/2013  . Bee venom Swelling 01/16/2013  . Cymbalta [duloxetine hcl] Other (See Comments) 01/23/2013    Vitals: BP 135/68 mmHg  Pulse 64  Temp(Src) 98 F (36.7 C) (Oral)  Resp 14  Ht 5' 6"  (1.676 m)  Wt 120 lb (54.432 kg)  BMI 19.38 kg/m2 Last Weight:  Wt Readings from Last 1 Encounters:  07/10/14 120 lb (54.432 kg)   Last Height:   Ht Readings from Last 1 Encounters:  07/10/14 5' 6"  (1.676 m)    Physical exam:  General: The patient is awake, alert and appears not in acute distress. The patient is well groomed. Head: Normocephalic, atraumatic.  Pale facial skin, pre-aged , Neck is supple. Mallampati 3 , neck circumference 13.5 .  Cardiovascular:  Regular rate and rhythm , without  murmurs or carotid bruit, and without distended neck veins. Respiratory: Lungs are clear to auscultation. Skin:  Without evidence of edema, or rash Trunk: BMI - he lost weight. Has  normal posture.  Neurologic exam : The patient is awake and alert, oriented to place and time.  Memory subjective   described as intact. There is a normal attention span & concentration ability.  Speech is fluent without dysarthria, dysphonia or aphasia. Mood and affect are appropriate.  Cranial nerves: Pupils are equal and briskly reactive to light. Funduscopic exam without  evidence of pallor or edema.  His right  eye has mild ptosis,  Enophthalmos . his left eyebrow can be lifted higher than his right . Extraocular movements  in vertical and horizontal planes intact and without nystagmus.  Visual  fields by finger perimetry are intact. Hearing to finger rub intact.  Tongue and uvula move midline.  Motor exam:  Normal tone and normal muscle bulk and symmetric normal strength in all extremities.  Sensory:  Fine touch, pinprick and vibration were reduced in both feet- Proprioception is normal.  Coordination: Rapid alternating movements in the fingers/hands is tested and normal.  Finger-to-nose maneuver tested and normal without evidence of ataxia, dysmetria or tremor.  Gait and station: Patient walks without assistive device . Strength within normal limits.  Stance is stable and normal.  Deep tendon reflexes: in the  upper and lower extremities are attenuated - Babinski maneuver equivocal.   Assessment: Refills for stimulant. This patient has been using stimulants for decades and is dependent on these.   Plan:  Treatment plan and additional workup : sleep hygiene implementation- ESTABLISH a BED TIME and a STRICT RISE TIME- adhere to it, even if you didn't sleep. Adhere to diabetic diet .  You eat only 2 meals a day, and feel less hungry- this may be from diabetic gastroparesis, constant diarrhea alternating with constipation. 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  Dr Dwyane Dee. I like for the patient to try only 5 Mg Reglan at lunch time with a snack.  Talk to Dr Arbie Cookey about  Reglan or Zofran ,  Testosterone and how long this treatment should be provided.

## 2014-07-12 ENCOUNTER — Ambulatory Visit (INDEPENDENT_AMBULATORY_CARE_PROVIDER_SITE_OTHER): Payer: Medicare Other | Admitting: Internal Medicine

## 2014-07-12 ENCOUNTER — Encounter: Payer: Self-pay | Admitting: Internal Medicine

## 2014-07-12 VITALS — BP 134/66 | HR 71 | Ht 66.0 in | Wt 123.6 lb

## 2014-07-12 DIAGNOSIS — G4710 Hypersomnia, unspecified: Secondary | ICD-10-CM

## 2014-07-12 NOTE — Patient Instructions (Signed)
Try to avoid caffeine after supper  We would like to reduce total fluid intake in the evening to reduce bathroom trips  Ok to take melatonin in the evening  3-6 mg  Ok to keep bedtime for now at 1:30 AM  We would like to try shortening the total sleep time, so you get up at 10:30 AM  Try taking the Ritalin at your last bathroom trip - about an hour before you intend to get up (so that would mean taking it around 9:30 AM)

## 2014-07-12 NOTE — Progress Notes (Signed)
07/12/14-  32 yoM former smoker referred courtesy of Dr Jonelle Sidle [name]-has had 2 sleep studies in the past-most recent was at [facility] office. He complains of being too sleepy. During the last for 5 years he feels he sleeps "too much". Bedtime around 1 AM with sleep latency a couple of minutes, waking 5 or 6 times for bathroom before up at 1:30 PM. He sleeps most of each night. She does have some restlessness, helped by melatonin. In the last year he feels it takes him too long to get back to sleep after bathroom Past history of hypothyroidism. Takes Ritalin for long-term diagnosis of ADHD. Diabetic. Has lost a few pounds 1 caffeinated soft drink daily, 3 margaritas in the evening. He denies daytime sleepiness and feels he always has energy Works part-time as a Engineer, materials. NPSG Neurology 04/23/08- AHI 0.0/ hr, frequent limb jerks-PLMI 10.9/ hr, especially in REM. 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 Authorizing Provider  aspirin 81 MG tablet Take 81 mg by mouth daily.    Yes Historical Provider, MD  clopidogrel (PLAVIX) 75 MG tablet Take 75 mg by mouth daily with breakfast. Blood Thinner 03/14/14  Yes Ripudeep K Rai, MD  Continuous Glucose Monitor (DEXCOM G4 PLATINUM RECEIVER) KIT USE AS DIRECTED 07/10/14  Yes Elayne Snare, MD  diphenoxylate-atropine (LOMOTIL) 2.5-0.025 MG per tablet TAKE ONE OR TWO TABLETS BY MOUTH TWICE DAILY AS NEEDED  07/04/14  Yes Mahima Pandey, MD  escitalopram (LEXAPRO) 20 MG tablet Take 20 mg by mouth daily.  05/03/14  Yes Historical Provider, MD  Insulin Glargine (LANTUS) 100 UNIT/ML Solostar Pen Take 6 units in the morning 09/21/13  Yes Elayne Snare, MD  Insulin Isophane Human (HUMULIN N) 100 UNIT/ML  Kiwkpen Inject 3 Units into the skin every evening.  10/18/13  Yes Elayne Snare, MD  insulin lispro (HUMALOG) 100 UNIT/ML KiwkPen Inject 1-6 Units into the skin daily as needed (after meals).  03/16/13  Yes Elayne Snare, MD  Insulin Pen Needle (BD PEN NEEDLE NANO U/F) 32G X 4 MM MISC 1 each by Does not apply route 3 (three) times daily. 05/04/14  Yes Elayne Snare, MD  levothyroxine (SYNTHROID, LEVOTHROID) 75 MCG tablet TAKE ONE TABLET BY MOUTH ONE TIME DAILY FOR THYROID  05/29/14  Yes Mahima Bubba Camp, MD  methylphenidate (RITALIN) 20 MG tablet Take 1 tablet (20 mg total) by mouth 2 (two) times daily. 07/10/14  Yes Carmen Dohmeier, MD  metoCLOPramide (REGLAN) 5 MG tablet Use only once a day after lunch tie meal  Po. Trial for 30 days. 07/10/14  Yes Asencion Partridge Dohmeier, MD  metoprolol tartrate (LOPRESSOR) 25 MG tablet Take 1 tablet (25 mg total) by mouth 2 (two) times daily. 05/29/14  Yes Mahima Pandey, MD  ONE TOUCH ULTRA TEST test strip TEST 8 TIMES PER DAY FOR 30 DAYS 04/26/14  Yes Elayne Snare, MD  pantoprazole (PROTONIX) 40 MG tablet Take 1 tablet (40 mg total) by mouth daily. 03/13/14  Yes Ripudeep Krystal Eaton, MD  pravastatin (PRAVACHOL) 20 MG tablet Take 1 tablet (20 mg total) by mouth every evening. 07/06/14  Yes Minus Breeding, MD  pregabalin (LYRICA) 50 MG capsule Take 50-100 mg by mouth at bedtime.   Yes Historical Provider, MD  vitamin B-12 (CYANOCOBALAMIN)  1000 MCG tablet Take 1,000 mcg by mouth daily.   Yes Historical Provider, MD   Past Medical History  Diagnosis Date  . CAD (coronary artery disease)     LAD 40-50% stenosis, first diagonal small 90% stenosis, circumflex 70% stenosis, OM 80% stenosis, right coronary artery 80-90% stenosis.  . Right upper lobe pneumonia 11/28/2008  . Anxiety 07/18/2008  . Irritable bowel syndrome 04/27/2008  . COPD (chronic obstructive pulmonary disease) 02/27/2008  . B12 deficiency 02/16/2008    in 5/09: B12 262, MMA 1040  . Chronic diarrhea 01/01/2008    s/p EGD 10/06: H  pylori + gastritis, duodenal biopsy normal (Dr. Benson Norway); s/p colonoscopy 10/06: hemorrhoids (Dr. Benson Norway); evaluated by Dr. Scarlette Shorts  in 8/09: tissue transglutaminase AB negative, VIP normal, stool fat content normal, urine 5-HIAA normal; normal GES 11/09 (done for "fluctuating sugars")  . 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 Orthostatic hypotension 02/09/2007  . Hypertension 02/09/2007  . Spinal stenosis in cervical region 05/03    MRI  . Erectile dysfunction 09/27/2006    s/p penile implant  . Anemia, iron deficiency 09/27/2006    neg colonoscopy 2006 - Dr. Benson Norway; ferritin 152; hgb  15.7  on 07/07  . Hypothyroidism 09/27/2006    TSH 2.639  07/07  . Hypersomnia 09/27/2006    evaluated by Dr. Baird Lyons (5/08) and Dr. Brett Fairy; PSG 8/09: chronic delayed sleep phase syndrome (patient sleeps during the day and is awake at night), nocturnal myoclonus (eval for RLS and IDA suggested). Pt advised to change sleeping behavior  . Diabetes mellitus type II 09/07/1984    poorly controlled, complicated by peripheral neuropathy, microalbuminuria, mild non proliferative retinopathy, s/p DKA 7/05, on insulin pump x 4/09, started a pump vacation on 11/19/2008  . Hyperlipidemia   . 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 Hemorrhoids   . Chronic kidney disease   . Unspecified hereditary and idiopathic peripheral neuropathy   . Other chronic pain   . Other malaise and fatigue   . Depression   . Hypertrophy of prostate without urinary obstruction and other lower urinary tract symptoms (LUTS)   . Lumbago   . Heart attack 03/2014    mild   Past Surgical History  Procedure Laterality Date  . Penile prosthesis implant    . Tonsillectomy    . Colonoscopy  06/25/2005    Dr. Carol Ada   Family History  Problem Relation Age of Onset  . Bone cancer Mother   . Breast cancer Mother   . Cancer Mother   . Lung cancer Father   . Cancer Father   . Breast cancer Sister   . Cancer Sister    . Lung cancer Brother   . Cancer Sister     type unknown   History   Social History  . Marital Status: Divorced    Spouse Name: [name]    Number of Children: 1  . Years of Education: 15   Occupational History  . [employer]ATCH [employer]ER [employer]    Social History Main Topics  . Smoking status: Former Smoker    Types: Cigarettes    Quit date: 09/28/2004  . Smokeless tobacco: Never Used  . Alcohol Use: 0.0 oz/week    0 Not specified per week     Comment: occ/social (moderate)  . Drug Use: No  . Sexual Activity: Not on file   Other Topics Concern  . Not on file   Social History Narrative   Patient is right handed, quit caffeinated beverages  in 2005.   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 He is divorced.   ROS-see HPI Constitutional:   +weight loss, no-night sweats, fevers, chills, fatigue, lassitude. HEENT:   No-  headaches, difficulty swallowing, tooth/dental problems, sore throat,       No-  sneezing, itching, ear ache, nasal congestion, post nasal drip,  CV:  No-   chest pain, orthopnea, PND, swelling in lower extremities, anasarca, dizziness, palpitations Resp: + shortness of breath with exertion or at rest.              No-   productive cough,  No non-productive cough,  No- coughing up of blood.              No-   change in color of mucus.  No- wheezing.   Skin: No-   rash or lesions. GI:  No-   heartburn, indigestion, abdominal pain, nausea, vomiting, diarrhea,                 change in bowel habits, loss of appetite GU: No-   dysuria, change in color of urine, no urgency or frequency.  No- flank pain. MS:  No-   joint pain or swelling.  No- decreased range of motion.  No- back pain. Neuro-     nothing unusual Psych:  No- change in mood or affect. No depression or anxiety.  No memory loss.  OBJ- Physical Exam General- Alert, Oriented, Affect-appropriate, Distress- none acute, trim Skin- rash-none, lesions- none, excoriation- none Lymphadenopathy- none Head-  atraumatic            Eyes- Gross vision intact, PERRLA, conjunctivae and secretions clear            Ears- Hearing, canals-normal            Nose- Clear, no-Septal dev, mucus, polyps, erosion, perforation             Throat- Mallampati II , mucosa clear , drainage- none, tonsils- atrophic Neck- flexible , trachea midline, no stridor , thyroid nl, carotid no bruit Chest - symmetrical excursion , unlabored           Heart/CV- RRR , no murmur , no gallop  , no rub, nl s1 s2                           - JVD- none , edema- none, stasis changes- none, varices- none           Lung- clear to P&A, wheeze- none, cough- none , dullness-none, rub- none           Chest wall-  Abd- tender-no, distended-no, bowel sounds-present, HSM- no Br/ Gen/ Rectal- Not done, not indicated Extrem- cyanosis- none, clubbing, none, atrophy- none, strength- nl Neuro- grossly intact to observation

## 2014-07-14 NOTE — Assessment & Plan Note (Addendum)
He is not describing daytime sleepiness. He is describing a long sleep time and later then usual bedtime. Nighttime sleep is disturbed by bathroom trips and possibly by limb jerks. Sleep apnea was ruled out Alcohol in the evening and daytime use of Ritalin both may affect nighttime sleep Plan-educated on sleep hygiene. Melatonin at bedtime. Avoid caffeine after supper.  Take Ritalin at last bathroom break near the end of sleep, so that it can  help him get up earlier in the mornings. We will try sleep contraction therapy to consolidate his sleep in 10 hours and set of 12.  I'm not  anticipating an attempt to rotate his sleep time for now.

## 2014-07-17 ENCOUNTER — Other Ambulatory Visit: Payer: Self-pay | Admitting: Internal Medicine

## 2014-07-18 ENCOUNTER — Telehealth: Payer: Self-pay | Admitting: Cardiology

## 2014-07-18 ENCOUNTER — Other Ambulatory Visit: Payer: Self-pay | Admitting: *Deleted

## 2014-07-18 DIAGNOSIS — E109 Type 1 diabetes mellitus without complications: Secondary | ICD-10-CM

## 2014-07-18 NOTE — Telephone Encounter (Signed)
Error

## 2014-07-20 ENCOUNTER — Telehealth: Payer: Self-pay | Admitting: Cardiology

## 2014-07-20 NOTE — Telephone Encounter (Signed)
Records were faxed to Dr. Sharl Ma and f/u phone call was made on 07-19-14.  Spoke with Victorino Dike - Dr. Sharl Ma is booked until March for new patients.  I sent Dr. Antoine Poche a staff message to ask if there is anyone to refer him to.

## 2014-07-24 ENCOUNTER — Other Ambulatory Visit: Payer: Self-pay | Admitting: Internal Medicine

## 2014-07-26 ENCOUNTER — Telehealth: Payer: Self-pay | Admitting: *Deleted

## 2014-07-26 ENCOUNTER — Other Ambulatory Visit: Payer: Medicare Other

## 2014-07-26 ENCOUNTER — Other Ambulatory Visit: Payer: Self-pay | Admitting: *Deleted

## 2014-07-26 DIAGNOSIS — IMO0002 Reserved for concepts with insufficient information to code with codable children: Secondary | ICD-10-CM

## 2014-07-26 DIAGNOSIS — E039 Hypothyroidism, unspecified: Secondary | ICD-10-CM

## 2014-07-26 DIAGNOSIS — E1065 Type 1 diabetes mellitus with hyperglycemia: Secondary | ICD-10-CM

## 2014-07-26 DIAGNOSIS — E1040 Type 1 diabetes mellitus with diabetic neuropathy, unspecified: Secondary | ICD-10-CM

## 2014-07-26 NOTE — Telephone Encounter (Signed)
Order for Phase II cardiac Rehab faxed.

## 2014-08-01 ENCOUNTER — Ambulatory Visit: Payer: Medicare Other | Admitting: Endocrinology

## 2014-08-05 ENCOUNTER — Telehealth: Payer: Self-pay | Admitting: Cardiology

## 2014-08-05 NOTE — Telephone Encounter (Signed)
BP readings reviewed.  Some readings are quite elevated.  I would like to add 2.5 mg of Norvasc in the AM.  I would like for him to keep his BPs twice daily every day for 3 weeks.

## 2014-08-09 ENCOUNTER — Other Ambulatory Visit: Payer: Self-pay | Admitting: *Deleted

## 2014-08-09 MED ORDER — HYDROCODONE-ACETAMINOPHEN 5-325 MG PO TABS: ORAL_TABLET | ORAL | Status: DC

## 2014-08-09 NOTE — Telephone Encounter (Signed)
Patient requested and will pick up.  Medication was taken out of patient's medication list, but Dr. Renato Gails stated to add it back in and refill.

## 2014-08-14 ENCOUNTER — Other Ambulatory Visit: Payer: Self-pay | Admitting: Endocrinology

## 2014-08-15 ENCOUNTER — Other Ambulatory Visit: Payer: Medicare Other

## 2014-08-16 ENCOUNTER — Encounter (HOSPITAL_COMMUNITY): Payer: Self-pay | Admitting: Cardiology

## 2014-08-20 ENCOUNTER — Ambulatory Visit: Payer: Medicare Other | Admitting: Endocrinology

## 2014-08-22 ENCOUNTER — Encounter: Payer: Self-pay | Admitting: Internal Medicine

## 2014-08-24 ENCOUNTER — Telehealth: Payer: Self-pay | Admitting: Cardiology

## 2014-08-24 NOTE — Telephone Encounter (Signed)
Received medical records on Benjamin Hodge on 08/24/2014 he has appointment on 09/14/2014 with Dr. Antoine Poche, I gave records to [facility]. cbr

## 2014-08-27 ENCOUNTER — Other Ambulatory Visit: Payer: Medicare Other

## 2014-08-27 DIAGNOSIS — IMO0002 Reserved for concepts with insufficient information to code with codable children: Secondary | ICD-10-CM

## 2014-08-27 DIAGNOSIS — E1065 Type 1 diabetes mellitus with hyperglycemia: Secondary | ICD-10-CM

## 2014-08-27 DIAGNOSIS — E785 Hyperlipidemia, unspecified: Secondary | ICD-10-CM

## 2014-08-27 DIAGNOSIS — E039 Hypothyroidism, unspecified: Secondary | ICD-10-CM

## 2014-08-27 DIAGNOSIS — D649 Anemia, unspecified: Secondary | ICD-10-CM

## 2014-08-27 DIAGNOSIS — E1040 Type 1 diabetes mellitus with diabetic neuropathy, unspecified: Secondary | ICD-10-CM

## 2014-08-28 ENCOUNTER — Encounter: Payer: Self-pay | Admitting: *Deleted

## 2014-08-28 LAB — COMPREHENSIVE METABOLIC PANEL
A/G RATIO: 1.7 (ref 1.1–2.5)
ALT: 49 IU/L — AB (ref 0–44)
AST: 84 IU/L — AB (ref 0–40)
Albumin: 3.7 g/dL (ref 3.5–4.8)
Alkaline Phosphatase: 80 IU/L (ref 39–117)
BUN/Creatinine Ratio: 25 — ABNORMAL HIGH (ref 10–22)
BUN: 21 mg/dL (ref 8–27)
CO2: 28 mmol/L (ref 18–29)
Calcium: 8.5 mg/dL — ABNORMAL LOW (ref 8.6–10.2)
Chloride: 105 mmol/L (ref 97–108)
Creatinine, Ser: 0.83 mg/dL (ref 0.76–1.27)
GFR, EST AFRICAN AMERICAN: 102 mL/min/{1.73_m2} (ref >59)
GFR, EST NON AFRICAN AMERICAN: 89 mL/min/{1.73_m2} (ref >59)
GLOBULIN, TOTAL: 2.2 g/dL (ref 1.5–4.5)
Glucose: 72 mg/dL (ref 65–99)
POTASSIUM: 5.1 mmol/L (ref 3.5–5.2)
SODIUM: 143 mmol/L (ref 134–144)
Total Bilirubin: 0.2 mg/dL (ref 0.0–1.2)
Total Protein: 5.9 g/dL — ABNORMAL LOW (ref 6.0–8.5)

## 2014-08-28 LAB — LIPID PANEL
CHOLESTEROL TOTAL: 144 mg/dL (ref 100–199)
Chol/HDL Ratio: 1.9 ratio units (ref 0.0–5.0)
HDL: 76 mg/dL (ref >39)
LDL Calculated: 41 mg/dL (ref 0–99)
TRIGLYCERIDES: 135 mg/dL (ref 0–149)
VLDL Cholesterol Cal: 27 mg/dL (ref 5–40)

## 2014-08-28 LAB — TSH: TSH: 2.22 u[IU]/mL (ref 0.450–4.500)

## 2014-08-28 LAB — HEMOGLOBIN A1C
ESTIMATED AVERAGE GLUCOSE: 174 mg/dL
Hgb A1c MFr Bld: 7.7 % — ABNORMAL HIGH (ref 4.8–5.6)

## 2014-08-28 LAB — HM DIABETES EYE EXAM

## 2014-08-28 LAB — MICROALBUMIN, URINE: Microalbumin, Urine: 87.5 ug/mL — ABNORMAL HIGH (ref 0.0–17.0)

## 2014-08-29 ENCOUNTER — Ambulatory Visit (INDEPENDENT_AMBULATORY_CARE_PROVIDER_SITE_OTHER): Payer: Medicare Other | Admitting: Internal Medicine

## 2014-08-29 ENCOUNTER — Encounter: Payer: Self-pay | Admitting: Internal Medicine

## 2014-08-29 VITALS — BP 120/80 | HR 63 | Temp 97.4°F | Resp 20 | Ht 66.0 in | Wt 122.8 lb

## 2014-08-29 DIAGNOSIS — R740 Nonspecific elevation of levels of transaminase and lactic acid dehydrogenase [LDH]: Secondary | ICD-10-CM

## 2014-08-29 DIAGNOSIS — E1065 Type 1 diabetes mellitus with hyperglycemia: Secondary | ICD-10-CM

## 2014-08-29 DIAGNOSIS — E1049 Type 1 diabetes mellitus with other diabetic neurological complication: Secondary | ICD-10-CM

## 2014-08-29 DIAGNOSIS — N183 Chronic kidney disease, stage 3 unspecified: Secondary | ICD-10-CM

## 2014-08-29 DIAGNOSIS — R7401 Elevation of levels of liver transaminase levels: Secondary | ICD-10-CM

## 2014-08-29 DIAGNOSIS — E1043 Type 1 diabetes mellitus with diabetic autonomic (poly)neuropathy: Secondary | ICD-10-CM

## 2014-08-29 DIAGNOSIS — G990 Autonomic neuropathy in diseases classified elsewhere: Secondary | ICD-10-CM

## 2014-08-29 DIAGNOSIS — R58 Hemorrhage, not elsewhere classified: Secondary | ICD-10-CM | POA: Insufficient documentation

## 2014-08-29 DIAGNOSIS — E785 Hyperlipidemia, unspecified: Secondary | ICD-10-CM

## 2014-08-29 DIAGNOSIS — I10 Essential (primary) hypertension: Secondary | ICD-10-CM

## 2014-08-29 DIAGNOSIS — IMO0002 Reserved for concepts with insufficient information to code with codable children: Secondary | ICD-10-CM

## 2014-08-29 DIAGNOSIS — E039 Hypothyroidism, unspecified: Secondary | ICD-10-CM

## 2014-08-29 DIAGNOSIS — E1041 Type 1 diabetes mellitus with diabetic mononeuropathy: Secondary | ICD-10-CM

## 2014-08-29 DIAGNOSIS — E1042 Type 1 diabetes mellitus with diabetic polyneuropathy: Secondary | ICD-10-CM

## 2014-08-29 MED ORDER — METOCLOPRAMIDE HCL 5 MG PO TABS: ORAL_TABLET | ORAL | Status: DC

## 2014-08-29 MED ORDER — PREGABALIN 50 MG PO CAPS
ORAL_CAPSULE | ORAL | Status: DC
Start: 2014-08-29 — End: 2014-11-23

## 2014-08-29 NOTE — Progress Notes (Signed)
Patient ID: Benjamin Hodge, male   DOB: 1940/11/25, 73 y.o.   MRN: [id number]    Facility  [facility]    Place of Service:   OFFICE   Allergies  Allergen Reactions  . Lipitor [Atorvastatin] Other (See Comments)    Caused pain all over body.  . Ace Inhibitors Other (See Comments)    dizzy  . Angiotensin Receptor Blockers Other (See Comments)    unknown  . Bee Venom Swelling  . Cymbalta [Duloxetine Hcl] Other (See Comments)    dizzy    Chief Complaint  Patient presents with  . Medical Management of Chronic Issues    HPI:  1. 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 He has been under the care of Dr. Dwyane Dee. His cardiologist suggested that he see Dr. Buddy Duty and he has an appointment in the near future.  2. Type 1 diabetes mellitus with neurological manifestations, uncontrolled Patient will be reviewing his diabetic management with Dr. Buddy Duty  3. Chronic renal failure, stage 3 (moderate) Stable  4. Dyslipidemia Controlled  5. Elevated AST (SGOT) Unchanged  6. Essential hypertension Controlled  7. Hypothyroidism, unspecified hypothyroidism type Compensated   8. Ecchymosis Multiple on the arms   Medications: Patient's Medications  New Prescriptions   No medications on file  Previous Medications   ASPIRIN 81 MG TABLET    Take 81 mg by mouth daily.    CLOPIDOGREL (PLAVIX) 75 MG TABLET    Take 75 mg by mouth daily with breakfast. Blood Thinner   CONTINUOUS GLUCOSE MONITOR (DEXCOM G4 PLATINUM RECEIVER) KIT    USE AS DIRECTED   DIPHENOXYLATE-ATROPINE (LOMOTIL) 2.5-0.025 MG PER TABLET    TAKE ONE OR TWO TABLETS BY MOUTH TWICE DAILY AS NEEDED    ESCITALOPRAM (LEXAPRO) 20 MG TABLET    TAKE ONE TABLET BY MOUTH ONE TIME DAILY    GABAPENTIN (NEURONTIN) 600 MG TABLET    TAKE ONE TABLET BY MOUTH THREE TIMES DAILY    HYDROCODONE-ACETAMINOPHEN (NORCO/VICODIN) 5-325 MG PER TABLET    Take one tablet by mouth five times daily  as needed for pain   INSULIN GLARGINE (TOUJEO SOLOSTAR) 300 UNIT/ML SOPN    Inject 10 Units into the skin daily.   INSULIN LISPRO (HUMALOG) 100 UNIT/ML KIWKPEN    Inject 1-6 Units into the skin daily as needed (after meals).    INSULIN PEN NEEDLE (BD PEN NEEDLE NANO U/F) 32G X 4 MM MISC    1 each by Does not apply route 3 (three) times daily.   LEVOTHYROXINE (SYNTHROID, LEVOTHROID) 75 MCG TABLET    TAKE ONE TABLET BY MOUTH ONE TIME DAILY FOR THYROID    METHYLPHENIDATE (RITALIN) 20 MG TABLET    Take 1 tablet (20 mg total) by mouth 2 (two) times daily.   METOCLOPRAMIDE (REGLAN) 5 MG TABLET    Use only once a day after lunch tie meal  Po. Trial for 30 days.   METOPROLOL TARTRATE (LOPRESSOR) 25 MG TABLET    Take 1 tablet (25 mg total) by mouth 2 (two) times daily.   ONE TOUCH ULTRA TEST TEST STRIP    TEST 8 TIMES PER DAY FOR 30 DAYS   PANTOPRAZOLE (PROTONIX) 40 MG TABLET    Take 1 tablet (40 mg total) by mouth daily.   PRAVASTATIN (PRAVACHOL) 20 MG TABLET    Take 1 tablet (20 mg total) by mouth every evening.   PREGABALIN (LYRICA) 50 MG CAPSULE    Take  50-100 mg by mouth at bedtime.   VITAMIN B-12 (CYANOCOBALAMIN) 1000 MCG TABLET    Take 1,000 mcg by mouth daily.  Modified Medications   No medications on file  Discontinued Medications   INSULIN GLARGINE (LANTUS) 100 UNIT/ML SOLOSTAR PEN    Take 6 units in the morning   INSULIN ISOPHANE HUMAN (HUMULIN N) 100 UNIT/ML KIWKPEN    Inject 3 Units into the skin every evening.      Review of Systems  Constitutional: Negative for fever, activity change, appetite change, fatigue and unexpected weight change.  HENT: Negative for ear pain, hearing loss, mouth sores, sore throat and tinnitus.   Eyes: Negative.   Respiratory: Negative for cough, choking, shortness of breath and wheezing.   Cardiovascular: Negative for chest pain, palpitations and leg swelling.  Gastrointestinal: Positive for diarrhea.  Endocrine:       Brittle diabetic with autonomic and  peripheral neuropathy  Genitourinary: Positive for frequency.  Musculoskeletal:       Tender right foot Pain in the bend of the left arm.  Skin:        Scattered ecchymoses on the arms and there is an ecchymotic area right foot medially at the arch.  Allergic/Immunologic: Negative.   Neurological: Positive for weakness and numbness. Negative for dizziness, tremors and headaches.  Psychiatric/Behavioral: Negative for hallucinations, behavioral problems, dysphoric mood and agitation. The patient is hyperactive.     Filed Vitals:   08/29/14 1159  BP: 120/80  Pulse: 63  Temp: 97.4 F (36.3 C)  TempSrc: Oral  Resp: 20  Height: 5' 6"  (1.676 m)  Weight: 122 lb 12.8 oz (55.702 kg)  SpO2: 97%   Body mass index is 19.83 kg/(m^2).  Physical Exam  Constitutional: He is oriented to person, place, and time. He appears distressed.  Thin and frail  HENT:  Head: Normocephalic.  Right Ear: External ear normal.  Left Ear: External ear normal.  Nose: Nose normal.  Mouth/Throat: Oropharynx is clear and moist. No oropharyngeal exudate.  Eyes: Conjunctivae and EOM are normal. Pupils are equal, round, and reactive to light.  Neck: No JVD present. No tracheal deviation present. No thyromegaly present.  Cardiovascular: Normal rate, regular rhythm, normal heart sounds and intact distal pulses.  Exam reveals no friction rub.   No murmur heard. Pulmonary/Chest: No respiratory distress. He has no wheezes. He has no rales. He exhibits no tenderness.  Abdominal: He exhibits no distension and no mass. There is no tenderness.  Genitourinary: Left testis shows tenderness.  Musculoskeletal: Normal range of motion. He exhibits tenderness. He exhibits no edema.  Lymphadenopathy:    He has no cervical adenopathy.  Neurological: He is oriented to person, place, and time. He has normal reflexes. No cranial nerve deficit. Coordination normal.  Skin: No rash noted. No erythema. No pallor.  Psychiatric: He has a  normal mood and affect. His behavior is normal. Thought content normal.     Labs reviewed: Lab on 08/27/2014  Component Date Value Ref Range Status  . Hgb A1c MFr Bld 08/27/2014 7.7* 4.8 - 5.6 % Final   Comment:          Pre-diabetes: 5.7 - 6.4          Diabetes: >6.4          Glycemic control for adults with diabetes: <7.0   . Est. average glucose Bld gHb Est-m* 08/27/2014 174   Final  . Cholesterol, Total 08/27/2014 144  100 - 199 mg/dL Final  . Triglycerides  08/27/2014 135  0 - 149 mg/dL Final  . HDL 08/27/2014 76  >39 mg/dL Final   Comment: According to ATP-III Guidelines, HDL-C >59 mg/dL is considered a negative risk factor for CHD.   Marland Kitchen VLDL Cholesterol Cal 08/27/2014 27  5 - 40 mg/dL Final  . LDL Calculated 08/27/2014 41  0 - 99 mg/dL Final  . Chol/HDL Ratio 08/27/2014 1.9  0.0 - 5.0 ratio units Final   Comment:                                   T. Chol/HDL Ratio                                             Men  Women                               1/2 Avg.Risk  3.4    3.3                                   Avg.Risk  5.0    4.4                                2X Avg.Risk  9.6    7.1                                3X Avg.Risk 23.4   11.0   . Glucose 08/27/2014 72  65 - 99 mg/dL Final  . BUN 08/27/2014 21  8 - 27 mg/dL Final  . Creatinine, Ser 08/27/2014 0.83  0.76 - 1.27 mg/dL Final  . GFR calc non Af Amer 08/27/2014 89  >59 mL/min/1.73 Final  . GFR calc Af Amer 08/27/2014 102  >59 mL/min/1.73 Final  . BUN/Creatinine Ratio 08/27/2014 25* 10 - 22 Final  . Sodium 08/27/2014 143  134 - 144 mmol/L Final  . Potassium 08/27/2014 5.1  3.5 - 5.2 mmol/L Final  . Chloride 08/27/2014 105  97 - 108 mmol/L Final  . CO2 08/27/2014 28  18 - 29 mmol/L Final  . Calcium 08/27/2014 8.5* 8.6 - 10.2 mg/dL Final  . Total Protein 08/27/2014 5.9* 6.0 - 8.5 g/dL Final  . Albumin 08/27/2014 3.7  3.5 - 4.8 g/dL Final  . Globulin, Total 08/27/2014 2.2  1.5 - 4.5 g/dL Final  . Albumin/Globulin Ratio  08/27/2014 1.7  1.1 - 2.5 Final  . Total Bilirubin 08/27/2014 0.2  0.0 - 1.2 mg/dL Final  . Alkaline Phosphatase 08/27/2014 80  39 - 117 IU/L Final  . AST 08/27/2014 84* 0 - 40 IU/L Final  . ALT 08/27/2014 49* 0 - 44 IU/L Final  . TSH 08/27/2014 2.220  0.450 - 4.500 uIU/mL Final  . Microalbum.,U,Random 08/27/2014 87.5* 0.0 - 17.0 ug/mL Final  Appointment on 06/05/2014  Component Date Value Ref Range Status  . Cholesterol, Total 06/05/2014 137  100 - 199 mg/dL Final  . Triglycerides 06/05/2014 72  0 - 149 mg/dL Final  . HDL 06/05/2014 54  >39 mg/dL Final   Comment: According to ATP-III  Guidelines, HDL-C >59 mg/dL is considered a                          negative risk factor for CHD.  Marland Kitchen VLDL Cholesterol Cal 06/05/2014 14  5 - 40 mg/dL Final  . LDL Calculated 06/05/2014 69  0 - 99 mg/dL Final  . Chol/HDL Ratio 06/05/2014 2.5  0.0 - 5.0 ratio units Final   Comment:                                   T. Chol/HDL Ratio                                                                      Men  Women                                                        1/2 Avg.Risk  3.4    3.3                                                            Avg.Risk  5.0    4.4                                                         2X Avg.Risk  9.6    7.1                                                         3X Avg.Risk 23.4   11.0  . Hgb A1c MFr Bld 06/05/2014 8.4* 4.8 - 5.6 % Final   Comment:          Increased risk for diabetes: 5.7 - 6.4                                   Diabetes: >6.4                                   Glycemic control for adults with diabetes: <7.0  . Est. average glucose Bld gHb Est-m* 06/05/2014 194   Final  . Glucose 06/05/2014 267* 65 - 99 mg/dL Final  . BUN 06/05/2014 16  8 - 27 mg/dL Final  . Creatinine, Ser 06/05/2014 0.98  0.76 - 1.27 mg/dL Final  . GFR  calc non Af Amer 06/05/2014 77  >59 mL/min/1.73 Final  . GFR calc Af Amer 06/05/2014 89  >59 mL/min/1.73 Final  . BUN/Creatinine  Ratio 06/05/2014 16  10 - 22 Final  . Sodium 06/05/2014 141  134 - 144 mmol/L Final  . Potassium 06/05/2014 5.3* 3.5 - 5.2 mmol/L Final  . Chloride 06/05/2014 102  97 - 108 mmol/L Final  . CO2 06/05/2014 26  18 - 29 mmol/L Final  . Calcium 06/05/2014 8.7  8.6 - 10.2 mg/dL Final  . Total Protein 06/05/2014 5.8* 6.0 - 8.5 g/dL Final  . Albumin 06/05/2014 3.5  3.5 - 4.8 g/dL Final  . Globulin, Total 06/05/2014 2.3  1.5 - 4.5 g/dL Final  . Albumin/Globulin Ratio 06/05/2014 1.5  1.1 - 2.5 Final  . Total Bilirubin 06/05/2014 0.4  0.0 - 1.2 mg/dL Final  . Alkaline Phosphatase 06/05/2014 68  39 - 117 IU/L Final  . AST 06/05/2014 21  0 - 40 IU/L Final  . ALT 06/05/2014 16  0 - 44 IU/L Final     Assessment/Plan  1. 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 Type 1 diabetes mellitus with neurological manifestations, uncontrolled - pregabalin (LYRICA) 50 MG capsule; 1-2 capsules at bed to help neuropathic pain  Dispense: 60 capsule; Refill: 5 - Hemoglobin A1c; Future - Comprehensive metabolic panel; Future - Microalbumin, urine; Future  3. Chronic renal failure, stage 3 (moderate) Stable  4. Dyslipidemia Adequately controlled - Lipid panel; Future  5. Elevated AST (SGOT) Unchanged  6. Essential hypertension Controlled  7. Hypothyroidism, unspecified hypothyroidism type Compensated  8. Ecchymosis - CBC With differential/Platelet; Future

## 2014-09-01 ENCOUNTER — Other Ambulatory Visit: Payer: Self-pay | Admitting: Neurology

## 2014-09-13 ENCOUNTER — Other Ambulatory Visit: Payer: Self-pay | Admitting: *Deleted

## 2014-09-13 ENCOUNTER — Ambulatory Visit: Payer: Medicare Other | Admitting: Internal Medicine

## 2014-09-13 MED ORDER — HYDROCODONE-ACETAMINOPHEN 5-325 MG PO TABS: ORAL_TABLET | ORAL | Status: DC

## 2014-09-13 NOTE — Telephone Encounter (Signed)
Patient requested and will pick up 

## 2014-09-14 ENCOUNTER — Other Ambulatory Visit: Payer: Self-pay

## 2014-09-14 ENCOUNTER — Encounter: Payer: Self-pay | Admitting: Cardiology

## 2014-09-14 ENCOUNTER — Ambulatory Visit (INDEPENDENT_AMBULATORY_CARE_PROVIDER_SITE_OTHER): Payer: 59 | Admitting: Cardiology

## 2014-09-14 VITALS — BP 127/68 | HR 59 | Ht 66.0 in | Wt 123.0 lb

## 2014-09-14 DIAGNOSIS — I951 Orthostatic hypotension: Secondary | ICD-10-CM

## 2014-09-14 DIAGNOSIS — I2510 Atherosclerotic heart disease of native coronary artery without angina pectoris: Secondary | ICD-10-CM | POA: Diagnosis not present

## 2014-09-14 MED ORDER — AMLODIPINE BESYLATE 5 MG PO TABS: 5.0000 mg | ORAL_TABLET | Freq: Every day | ORAL | Status: DC

## 2014-09-14 NOTE — Progress Notes (Signed)
HPI The patient presents for evaluation of coronary artery disease.  He has three-vessel coronary disease as described below this was managed medically. When I first saw him he he was having more orthostatic symptoms. However, his blood pressure diary and his readings demonstrated that he was hypertensive particularly in the morning. I did call and Norvasc but he actually didn't get that filled.  He was to get compression stockings for orthostatic blood pressure changes. In addition I want him to join cardiac rehabilitation. He did followup with his new endocrinologist and has had meds adjusted and his diabetes seems to be slightly better controlled.  Allergies  Allergen Reactions  . Lipitor [Atorvastatin] Other (See Comments)    Caused pain all over body.  . Ace Inhibitors Other (See Comments)    dizzy  . Angiotensin Receptor Blockers Other (See Comments)    unknown  . Bee Venom Swelling  . Cymbalta [Duloxetine Hcl] Other (See Comments)    dizzy    Current Outpatient Prescriptions  Medication Sig Dispense Refill  . aspirin 81 MG tablet Take 81 mg by mouth daily.     . clopidogrel (PLAVIX) 75 MG tablet Take 75 mg by mouth daily with breakfast. Blood Thinner    . Continuous Glucose Monitor (DEXCOM G4 PLATINUM RECEIVER) KIT USE AS DIRECTED 1 kit 1  . diphenoxylate-atropine (LOMOTIL) 2.5-0.025 MG per tablet TAKE ONE OR TWO TABLETS BY MOUTH TWICE DAILY AS NEEDED  60 tablet 0  . escitalopram (LEXAPRO) 20 MG tablet TAKE ONE TABLET BY MOUTH ONE TIME DAILY  30 tablet 5  . gabapentin (NEURONTIN) 600 MG tablet TAKE ONE TABLET BY MOUTH THREE TIMES DAILY  (Patient taking differently: Take 1/2 tablet daily) 90 tablet 1  . HYDROcodone-acetaminophen (NORCO/VICODIN) 5-325 MG per tablet Take one tablet by mouth five times daily as needed for pain 150 tablet 0  . Insulin Glargine (TOUJEO SOLOSTAR) 300 UNIT/ML SOPN Inject 10 Units into the skin daily.    . insulin lispro (HUMALOG) 100 UNIT/ML KiwkPen  Inject 1-6 Units into the skin daily as needed (after meals).     . Insulin Pen Needle (BD PEN NEEDLE NANO U/F) 32G X 4 MM MISC 1 each by Does not apply route 3 (three) times daily. 100 each 5  . levothyroxine (SYNTHROID, LEVOTHROID) 75 MCG tablet TAKE ONE TABLET BY MOUTH ONE TIME DAILY FOR THYROID  30 tablet 5  . methylphenidate (RITALIN) 20 MG tablet Take 1 tablet (20 mg total) by mouth 2 (two) times daily. (Patient taking differently: Take 20 mg by mouth daily. ) 180 tablet 0  . metoprolol tartrate (LOPRESSOR) 25 MG tablet Take 1 tablet (25 mg total) by mouth 2 (two) times daily. 60 tablet 5  . ONE TOUCH ULTRA TEST test strip TEST 8 TIMES PER DAY FOR 30 DAYS 250 each 4  . pantoprazole (PROTONIX) 40 MG tablet Take 1 tablet (40 mg total) by mouth daily. 30 tablet 5  . pravastatin (PRAVACHOL) 20 MG tablet Take 1 tablet (20 mg total) by mouth every evening. 90 tablet 3  . pregabalin (LYRICA) 50 MG capsule 1-2 capsules at bed to help neuropathic pain 60 capsule 5  . vitamin B-12 (CYANOCOBALAMIN) 1000 MCG tablet Take 1,000 mcg by mouth daily.     No current facility-administered medications for this visit.    Past Medical History  Diagnosis Date  . CAD (coronary artery disease)     LAD 40-50% stenosis, first diagonal small 90% stenosis, circumflex 70% stenosis, OM 80%  stenosis, right coronary artery 80-90% stenosis.  . Right upper lobe pneumonia 11/28/2008  . Anxiety 07/18/2008  . Irritable bowel syndrome 04/27/2008  . COPD (chronic obstructive pulmonary disease) 02/27/2008  . B12 deficiency 02/16/2008    in 5/09: B12 262, MMA 1040  . Chronic diarrhea 01/01/2008    s/p EGD 10/06: H pylori + gastritis, duodenal biopsy normal (Dr. Benson Norway); s/p colonoscopy 10/06: hemorrhoids (Dr. Benson Norway); evaluated by Dr. Scarlette Shorts  in 8/09: tissue transglutaminase AB negative, VIP normal, stool fat content normal, urine 5-HIAA normal; normal GES 11/09 (done for "fluctuating sugars")  . Mild nonproliferative  diabetic retinopathy(362.04) 11/18/2007  . Orthostatic hypotension 02/09/2007  . Hypertension 02/09/2007  . Spinal stenosis in cervical region 05/03    MRI  . Erectile dysfunction 09/27/2006    s/p penile implant  . Anemia, iron deficiency 09/27/2006    neg colonoscopy 2006 - Dr. Benson Norway; ferritin 152; hgb  15.7  on 07/07  . Hypothyroidism 09/27/2006    TSH 2.639  07/07  . Hypersomnia 09/27/2006    evaluated by Dr. Baird Lyons (5/08) and Dr. Brett Fairy; PSG 8/09: chronic delayed sleep phase syndrome (patient sleeps during the day and is awake at night), nocturnal myoclonus (eval for RLS and IDA suggested). Pt advised to change sleeping behavior  . Diabetes mellitus type II 09/07/1984    poorly controlled, complicated by peripheral neuropathy, microalbuminuria, mild non proliferative retinopathy, s/p DKA 7/05, on insulin pump x 4/09, started a pump vacation on 11/19/2008  . Hyperlipidemia   . 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 Hemorrhoids   . Chronic kidney disease   . Unspecified hereditary and idiopathic peripheral neuropathy   . Other chronic pain   . Other malaise and fatigue   . Depression   . Hypertrophy of prostate without urinary obstruction and other lower urinary tract symptoms (LUTS)   . Lumbago   . Heart attack 03/2014    mild    Past Surgical History  Procedure Laterality Date  . Penile prosthesis implant    . Tonsillectomy    . Colonoscopy  06/25/2005    Dr. Carol Ada  . Left heart catheterization with coronary angiogram [name] 03/13/2014    Procedure: LEFT HEART CATHETERIZATION WITH CORONARY ANGIOGRAM;  Surgeon: Peter M Martinique, MD;  Location: [facility] CATH LAB;  Service: Cardiovascular;  Laterality: [name];    ROS:  Orthostatic hypotension.  As stated in the HPI and negative for all other systems.  PHYSICAL EXAM BP 127/68 mmHg  Pulse 59  Ht 5' 6"  (1.676 m)  Wt 123 lb (55.792 kg)  BMI 19.86 kg/m2 GENERAL:  Well appearing HEENT:  Pupils equal round and reactive, fundi not  visualized, oral mucosa unremarkable NECK:  No jugular venous distention, waveform within normal limits, carotid upstroke brisk and symmetric, right bruits, no thyromegaly LUNGS:  Clear to auscultation bilaterally BACK:  No CVA tenderness CHEST:  Unremarkable HEART:  PMI not displaced or sustained,S1 and S2 within normal limits, no S3, no S4, no clicks, no rubs, no murmurs ABD:  Flat, positive bowel sounds normal in frequency in pitch, no bruits, no rebound, no guarding, no midline pulsatile mass, no hepatomegaly, no splenomegaly EXT:  2 plus pulses throughout, no edema, no cyanosis no clubbing SKIN:  No rashes no nodules   ASSESSMENT AND PLAN  CAD:  We will be aggressive with medical management. He still not having any symptoms so I don't think intervention is indicated. I would like him to this being cardiac rehabilitation.  He still has  not done this.    DYSLIPIDEMIA:  The patient has an excellent lipid profile.   I did start him on a statin at the last visit.  I will follow up with lipids in the future.    DIABETES:  We discussed strategies for diabetes control.  He will continue to follow with Dr. Buddy Duty.  ORTHOSTATIC HYPOTENSION:  He will get his compression stockings.  HTN:  He will start the Norvasc as prescribed.   We discussed strategies to avoid symptoms.  He was told to get compression stockings.  He has not yet done this.

## 2014-09-14 NOTE — Patient Instructions (Signed)
Your physician recommends that you schedule a follow-up appointment in: 6 months with Dr. Antoine Poche  Start taking Norvasc as directed

## 2014-09-18 ENCOUNTER — Other Ambulatory Visit: Payer: Self-pay | Admitting: Neurology

## 2014-09-18 ENCOUNTER — Other Ambulatory Visit: Payer: Self-pay | Admitting: Internal Medicine

## 2014-09-18 DIAGNOSIS — E1042 Type 1 diabetes mellitus with diabetic polyneuropathy: Secondary | ICD-10-CM | POA: Diagnosis not present

## 2014-09-18 DIAGNOSIS — Z23 Encounter for immunization: Secondary | ICD-10-CM | POA: Diagnosis not present

## 2014-09-22 ENCOUNTER — Other Ambulatory Visit: Payer: Self-pay | Admitting: Internal Medicine

## 2014-09-25 ENCOUNTER — Other Ambulatory Visit: Payer: Self-pay | Admitting: *Deleted

## 2014-09-25 MED ORDER — CLOPIDOGREL BISULFATE 75 MG PO TABS: 75.0000 mg | ORAL_TABLET | Freq: Every day | ORAL | Status: DC

## 2014-09-25 NOTE — Telephone Encounter (Signed)
Patient called and requested to be faxed to pharmacy 

## 2014-10-03 ENCOUNTER — Other Ambulatory Visit: Payer: Self-pay | Admitting: *Deleted

## 2014-10-03 MED ORDER — AMLODIPINE BESYLATE 2.5 MG PO TABS: 2.5000 mg | ORAL_TABLET | Freq: Every day | ORAL | Status: DC

## 2014-10-08 ENCOUNTER — Ambulatory Visit (INDEPENDENT_AMBULATORY_CARE_PROVIDER_SITE_OTHER): Payer: 59 | Admitting: Podiatry

## 2014-10-08 ENCOUNTER — Encounter: Payer: Self-pay | Admitting: Podiatry

## 2014-10-08 VITALS — BP 133/77 | HR 88 | Resp 14 | Ht 66.0 in | Wt 125.0 lb

## 2014-10-08 DIAGNOSIS — G990 Autonomic neuropathy in diseases classified elsewhere: Secondary | ICD-10-CM

## 2014-10-08 DIAGNOSIS — B351 Tinea unguium: Secondary | ICD-10-CM

## 2014-10-08 DIAGNOSIS — E1040 Type 1 diabetes mellitus with diabetic neuropathy, unspecified: Secondary | ICD-10-CM | POA: Diagnosis not present

## 2014-10-08 DIAGNOSIS — E1042 Type 1 diabetes mellitus with diabetic polyneuropathy: Secondary | ICD-10-CM | POA: Diagnosis not present

## 2014-10-08 DIAGNOSIS — E1043 Type 1 diabetes mellitus with diabetic autonomic (poly)neuropathy: Secondary | ICD-10-CM

## 2014-10-08 NOTE — Progress Notes (Signed)
   Subjective:    Patient ID: Benjamin Hodge, male    DOB: Mar 27, 1941, 74 y.o.   MRN: [id number]  HPI Comments: N debridment L 10 toenails D and O long-term C elongated toenails A diabetic pt, difficulty to cut T none  Patient denies any history of foot ulceration or claudication   Review of Systems  All other systems reviewed and are negative.      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 I discussed generalized diabetic foot care with patient today. Gen. information about diabetic foot care provided to patient today in the after visit summary  The toenails debrided without ableeding  Reappoint 3 months

## 2014-10-08 NOTE — Patient Instructions (Signed)
Diabetes and Foot Care Diabetes may cause you to have problems because of poor blood supply (circulation) to your feet and legs. This may cause the skin on your feet to become thinner, break easier, and heal more slowly. Your skin may become dry, and the skin may peel and crack. You may also have nerve damage in your legs and feet causing decreased feeling in them. You may not notice minor injuries to your feet that could lead to infections or more serious problems. Taking care of your feet is one of the most important things you can do for yourself.  HOME CARE INSTRUCTIONS  Wear shoes at all times, even in the house. Do not go barefoot. Bare feet are easily injured.  Check your feet daily for blisters, cuts, and redness. If you cannot see the bottom of your feet, use a mirror or ask someone for help.  Wash your feet with warm water (do not use hot water) and mild soap. Then pat your feet and the areas between your toes until they are completely dry. Do not soak your feet as this can dry your skin.  Apply a moisturizing lotion or petroleum jelly (that does not contain alcohol and is unscented) to the skin on your feet and to dry, brittle toenails. Do not apply lotion between your toes.  Trim your toenails straight across. Do not dig under them or around the cuticle. File the edges of your nails with an emery board or nail file.  Do not cut corns or calluses or try to remove them with medicine.  Wear clean socks or stockings every day. Make sure they are not too tight. Do not wear knee-high stockings since they may decrease blood flow to your legs.  Wear shoes that fit properly and have enough cushioning. To break in new shoes, wear them for just a few hours a day. This prevents you from injuring your feet. Always look in your shoes before you put them on to be sure there are no objects inside.  Do not cross your legs. This may decrease the blood flow to your feet.  If you find a minor scrape,  cut, or break in the skin on your feet, keep it and the skin around it clean and dry. These areas may be cleansed with mild soap and water. Do not cleanse the area with peroxide, alcohol, or iodine.  When you remove an adhesive bandage, be sure not to damage the skin around it.  If you have a wound, look at it several times a day to make sure it is healing.  Do not use heating pads or hot water bottles. They may burn your skin. If you have lost feeling in your feet or legs, you may not know it is happening until it is too late.  Make sure your health care provider performs a complete foot exam at least annually or more often if you have foot problems. Report any cuts, sores, or bruises to your health care provider immediately. SEEK MEDICAL CARE IF:   You have an injury that is not healing.  You have cuts or breaks in the skin.  You have an ingrown nail.  You notice redness on your legs or feet.  You feel burning or tingling in your legs or feet.  You have pain or cramps in your legs and feet.  Your legs or feet are numb.  Your feet always feel cold. SEEK IMMEDIATE MEDICAL CARE IF:   There is increasing redness,   swelling, or pain in or around a wound.  There is a red line that goes up your leg.  Pus is coming from a wound.  You develop a fever or as directed by your health care provider.  You notice a bad smell coming from an ulcer or wound. Document Released: 08/21/2000 Document Revised: 04/26/2013 Document Reviewed: 01/31/2013 Exit[employer] Patient Information 2015 [employer]. This information is not intended to replace advice given to you by your health care provider. Make sure you discuss any questions you have with your health care provider.  

## 2014-10-09 ENCOUNTER — Encounter: Payer: Self-pay | Admitting: Podiatry

## 2014-10-16 ENCOUNTER — Other Ambulatory Visit: Payer: Self-pay | Admitting: *Deleted

## 2014-10-16 ENCOUNTER — Other Ambulatory Visit: Payer: Self-pay | Admitting: Internal Medicine

## 2014-10-16 MED ORDER — HYDROCODONE-ACETAMINOPHEN 5-325 MG PO TABS: ORAL_TABLET | ORAL | Status: DC

## 2014-10-16 NOTE — Telephone Encounter (Signed)
Patient Requested and will pick up 

## 2014-10-18 ENCOUNTER — Other Ambulatory Visit: Payer: Self-pay | Admitting: *Deleted

## 2014-10-18 MED ORDER — PANTOPRAZOLE SODIUM 40 MG PO TBEC: 40.0000 mg | DELAYED_RELEASE_TABLET | Freq: Every day | ORAL | Status: DC

## 2014-10-18 MED ORDER — PANTOPRAZOLE SODIUM 40 MG PO TBEC: DELAYED_RELEASE_TABLET | ORAL | Status: DC

## 2014-10-18 NOTE — Telephone Encounter (Signed)
[facility] 

## 2014-11-01 ENCOUNTER — Ambulatory Visit: Payer: Self-pay | Admitting: Internal Medicine

## 2014-11-02 ENCOUNTER — Ambulatory Visit (INDEPENDENT_AMBULATORY_CARE_PROVIDER_SITE_OTHER): Payer: 59

## 2014-11-02 ENCOUNTER — Ambulatory Visit (INDEPENDENT_AMBULATORY_CARE_PROVIDER_SITE_OTHER): Payer: 59 | Admitting: Podiatry

## 2014-11-02 VITALS — BP 176/93 | HR 74 | Temp 96.3°F | Resp 16

## 2014-11-02 DIAGNOSIS — M10071 Idiopathic gout, right ankle and foot: Secondary | ICD-10-CM | POA: Diagnosis not present

## 2014-11-02 DIAGNOSIS — M7989 Other specified soft tissue disorders: Secondary | ICD-10-CM | POA: Diagnosis not present

## 2014-11-02 DIAGNOSIS — M109 Gout, unspecified: Secondary | ICD-10-CM

## 2014-11-02 DIAGNOSIS — M79675 Pain in left toe(s): Secondary | ICD-10-CM

## 2014-11-02 DIAGNOSIS — S92912A Unspecified fracture of left toe(s), initial encounter for closed fracture: Secondary | ICD-10-CM | POA: Diagnosis not present

## 2014-11-02 DIAGNOSIS — M79672 Pain in left foot: Secondary | ICD-10-CM | POA: Diagnosis not present

## 2014-11-05 ENCOUNTER — Encounter: Payer: Self-pay | Admitting: Podiatry

## 2014-11-05 NOTE — Progress Notes (Signed)
Patient ID: Benjamin Hodge, male   DOB: 04-17-1941, 74 y.o.   MRN: [id number]  Subjective: 74 year old male returns the office today's a walk-in appointment for concerns of left foot swelling and redness. He states his Ms. ongoing for approximately 2 weeks. He states the approximately 2 weeks ago he had a bruised dark area on the left big toe for which he points the lateral aspect of the base of the hallux. Since then he states that the area has remained swollen and he is a small amount of redness overlying the area. He does not recall any injury or trauma on that he doesn't he has neuropathy and cannot feel his feet. He denies any systemic complaints as fevers, chills, nausea, vomiting. No other complaints at this time.  Objective: AAO 3, NAD DP/PT pulses 1/4 bilaterally, CRT less than 3 seconds Protective sensation absent with Derrel Nip monofilament, nonreactive vibratory sensation, Achilles tendon reflex intact. There is edema overlying the left first MTPJ with erythema overlying the joint. There is no increase in warmth. There is no area of tenderness to bilateral lower extremity is have the patient does have neuropathy without sensation. First MTPJ range of motion is intact. There is no other areas of edema, erythema, increase in warmth bilateral lower extremities No open lesions or pre-ulcerative lesions identified bilaterally. Pain with calf compression, swelling, warmth, erythema.   Assessment: 74 year old male left first MTPJ edema, erythema 2 weeks  Plan: -X-rays were obtained and reviewed with the patient. There does agree to be a new area fracture of the base of the hallux laterally which was not present prior x-rays. -Discussed with the patient at the swelling and redness could be due to fracture is changes are to new compared to prior x-ray a year ago versus gout versus infection however this seems unlikely. -Dispensed surgical shoe to wear at all times. -Will obtain CBC,  ESR, CRP, uric acid -Follow-up in 2 weeks or sooner if any problems are to arise. In the meantime, encouraged call the office any questions, concerns, change in symptoms.

## 2014-11-08 DIAGNOSIS — M7989 Other specified soft tissue disorders: Secondary | ICD-10-CM | POA: Diagnosis not present

## 2014-11-08 DIAGNOSIS — M79672 Pain in left foot: Secondary | ICD-10-CM | POA: Diagnosis not present

## 2014-11-08 DIAGNOSIS — M79609 Pain in unspecified limb: Secondary | ICD-10-CM | POA: Diagnosis not present

## 2014-11-09 DIAGNOSIS — M25521 Pain in right elbow: Secondary | ICD-10-CM | POA: Diagnosis not present

## 2014-11-09 DIAGNOSIS — S51001A Unspecified open wound of right elbow, initial encounter: Secondary | ICD-10-CM | POA: Diagnosis not present

## 2014-11-09 LAB — ARTHRITIS PANEL
Anti Nuclear Antibody(ANA): NEGATIVE
Rhuematoid fact SerPl-aCnc: <10 IU/mL (ref <=14)
SED RATE: 44 mm/h — AB (ref 0–20)
Uric Acid, Serum: 5.9 mg/dL (ref 4.0–7.8)

## 2014-11-16 ENCOUNTER — Ambulatory Visit (INDEPENDENT_AMBULATORY_CARE_PROVIDER_SITE_OTHER): Payer: Medicare Other

## 2014-11-16 ENCOUNTER — Encounter: Payer: Self-pay | Admitting: Podiatry

## 2014-11-16 ENCOUNTER — Ambulatory Visit (INDEPENDENT_AMBULATORY_CARE_PROVIDER_SITE_OTHER): Payer: Medicare Other | Admitting: Podiatry

## 2014-11-16 VITALS — BP 144/70 | HR 67 | Resp 12

## 2014-11-16 DIAGNOSIS — R609 Edema, unspecified: Secondary | ICD-10-CM

## 2014-11-16 DIAGNOSIS — M109 Gout, unspecified: Secondary | ICD-10-CM

## 2014-11-16 DIAGNOSIS — M10071 Idiopathic gout, right ankle and foot: Secondary | ICD-10-CM | POA: Diagnosis not present

## 2014-11-16 DIAGNOSIS — S92912D Unspecified fracture of left toe(s), subsequent encounter for fracture with routine healing: Secondary | ICD-10-CM

## 2014-11-16 NOTE — Patient Instructions (Signed)
Continue to wear surgical shoe 

## 2014-11-20 ENCOUNTER — Encounter: Payer: Self-pay | Admitting: Podiatry

## 2014-11-20 ENCOUNTER — Other Ambulatory Visit: Payer: Self-pay | Admitting: *Deleted

## 2014-11-20 DIAGNOSIS — E1042 Type 1 diabetes mellitus with diabetic polyneuropathy: Secondary | ICD-10-CM | POA: Diagnosis not present

## 2014-11-20 DIAGNOSIS — F1010 Alcohol abuse, uncomplicated: Secondary | ICD-10-CM | POA: Diagnosis not present

## 2014-11-20 MED ORDER — HYDROCODONE-ACETAMINOPHEN 5-325 MG PO TABS: ORAL_TABLET | ORAL | Status: DC

## 2014-11-20 NOTE — Progress Notes (Signed)
Patient ID: CORRIE TOMINAGA, male   DOB: 1940/10/30, 74 y.o.   MRN: [id number]  Subjective: Mr. Hocker returns the office for follow-up of left foot pain and swelling. He states that over the last couple of days he has no significant reduction in swelling as well as redness overlying the area. He's been continuing with the surgical shoe. As he has significant neuropathy he has not had any pain throughout the whole course. He denies any open lesions or any areas of drainage. Denies any systemic complaints as fevers, chills, nausea, vomiting. No other complaints at this time in no acute changes since last appointment.  Objective: AAO 3, NAD DP/PT pulses 1/4 bilaterally, CRT less than 3 seconds Protective sensation absent with Dorann Ou monofilament, nonreactive vibratory sensation, Achilles tendon reflex intact. There is decreased edema overlying the left first MTPJ without erythema at this time. There is no increase in warmth of the area. There is no area of tenderness to bilateral lower extremity is have the patient does have significant neuropathy. First MTPJ range of motion is intact. There is no other areas of edema, erythema, increase in warmth bilateral lower extremities No open lesions or pre-ulcerative lesions identified bilaterally. Pain with calf compression, swelling, warmth, erythema.   Assessment: 74 year old male with resolving edema and erythema left foot  Plan: -X-rays were obtained and reviewed with the patient. There does appear to be some healing of that side of fracture on the lateral aspect of the proximal phalanx of the hallux. Given his history of bruising in this area prior to the swelling and erythema I do believe that he had an acute fracture to this area although he cannot recall any specific injury. The site of fracture was not present on prior x-rays. Also lab work was reviewed. Although uric acid is normal could be resolving gout. -Continue to wear surgical  shoe. -Follow-up in 2 weeks or sooner if any problems are to arise. In the meantime, encouraged call the office any questions, concerns, change in symptoms.

## 2014-11-20 NOTE — Telephone Encounter (Signed)
Patient Requested and will pick up 

## 2014-11-23 ENCOUNTER — Other Ambulatory Visit: Payer: Self-pay | Admitting: *Deleted

## 2014-11-23 DIAGNOSIS — E1049 Type 1 diabetes mellitus with other diabetic neurological complication: Secondary | ICD-10-CM

## 2014-11-23 DIAGNOSIS — E1065 Type 1 diabetes mellitus with hyperglycemia: Principal | ICD-10-CM

## 2014-11-23 DIAGNOSIS — IMO0002 Reserved for concepts with insufficient information to code with codable children: Secondary | ICD-10-CM

## 2014-11-23 MED ORDER — PREGABALIN 50 MG PO CAPS: ORAL_CAPSULE | ORAL | Status: DC

## 2014-11-23 NOTE — Telephone Encounter (Signed)
[facility]

## 2014-11-27 ENCOUNTER — Ambulatory Visit (INDEPENDENT_AMBULATORY_CARE_PROVIDER_SITE_OTHER): Payer: Medicare Other | Admitting: Internal Medicine

## 2014-11-27 ENCOUNTER — Encounter: Payer: Self-pay | Admitting: Internal Medicine

## 2014-11-27 VITALS — BP 156/80 | HR 63 | Temp 98.3°F | Ht 66.0 in | Wt 130.0 lb

## 2014-11-27 DIAGNOSIS — S41101A Unspecified open wound of right upper arm, initial encounter: Secondary | ICD-10-CM

## 2014-11-27 DIAGNOSIS — R0789 Other chest pain: Secondary | ICD-10-CM

## 2014-11-27 NOTE — Progress Notes (Signed)
Patient ID: Benjamin Hodge, male   DOB: 07/07/1941, 74 y.o.   MRN: [id number]    Facility  [facility]    Place of Service:   OFFICE   Allergies  Allergen Reactions  . Lipitor [Atorvastatin] Other (See Comments)    Caused pain all over body.  . Ace Inhibitors Other (See Comments)    dizzy  . Angiotensin Receptor Blockers Other (See Comments)    unknown  . Bee Venom Swelling  . Cymbalta [Duloxetine Hcl] Other (See Comments)    dizzy    Chief Complaint  Patient presents with  . Acute Visit    Patient thinks he has hurt/injured his ribs on right side, patient fell x 1 week ago.  . Gait Problem    Patient with balance issues, ? if physical therapy should be considered  . Medication Management    Discuss Protonix, patient not sure he needs to continue with this medication     HPI:  [place] a week ago. Tripped over the vacuum cleaner cord. Hit his right side and has had persistent pain. Difficult to take a deep breath. No bruising.  Also sustained a skin tear at right lower humerus and right upper forearm.  Medications: Patient's Medications  New Prescriptions   No medications on file  Previous Medications   AMLODIPINE (NORVASC) 5 MG TABLET    Take 1 tablet (5 mg total) by mouth daily.   ASPIRIN 81 MG TABLET    Take 81 mg by mouth daily.    CLOPIDOGREL (PLAVIX) 75 MG TABLET    Take 1 tablet (75 mg total) by mouth daily with breakfast. Blood Thinner   CONTINUOUS GLUCOSE MONITOR (DEXCOM G4 PLATINUM RECEIVER) KIT    USE AS DIRECTED   DIPHENOXYLATE-ATROPINE (LOMOTIL) 2.5-0.025 MG PER TABLET    TAKE ONE OR TWO TABLETS BY MOUTH TWICE DAILY AS NEEDED    ESCITALOPRAM (LEXAPRO) 20 MG TABLET    TAKE ONE TABLET BY MOUTH ONE TIME DAILY    GABAPENTIN (NEURONTIN) 600 MG TABLET    TAKE ONE TABLET BY MOUTH THREE TIMES DAILY    HYDROCODONE-ACETAMINOPHEN (NORCO/VICODIN) 5-325 MG PER TABLET    Take one tablet by mouth five times daily as needed for pain   INSULIN GLARGINE (TOUJEO SOLOSTAR) 300  UNIT/ML SOPN    Inject 10 Units into the skin daily.   INSULIN LISPRO (HUMALOG) 100 UNIT/ML KIWKPEN    Inject 1-6 Units into the skin daily as needed (after meals).    INSULIN PEN NEEDLE (BD PEN NEEDLE NANO U/F) 32G X 4 MM MISC    1 each by Does not apply route 3 (three) times daily.   LEVOTHYROXINE (SYNTHROID, LEVOTHROID) 75 MCG TABLET    TAKE ONE TABLET BY MOUTH ONE TIME DAILY FOR THYROID    METHYLPHENIDATE (RITALIN) 20 MG TABLET    Take 1 tablet (20 mg total) by mouth 2 (two) times daily.   METOCLOPRAMIDE (REGLAN) 5 MG TABLET    USE ONCE A DAY AFTER LUCHTIME MEAL   METOPROLOL TARTRATE (LOPRESSOR) 25 MG TABLET    Take 1 tablet (25 mg total) by mouth 2 (two) times daily.   ONE TOUCH ULTRA TEST TEST STRIP    TEST 8 TIMES PER DAY FOR 30 DAYS   PANTOPRAZOLE (PROTONIX) 40 MG TABLET    Take one tablet by mouth once daily for stomach   PRAVASTATIN (PRAVACHOL) 20 MG TABLET    Take 1 tablet (20 mg total) by mouth every evening.   PREGABALIN (LYRICA)  50 MG CAPSULE    1-2 capsules at bed to help neuropathic pain   VITAMIN B-12 (CYANOCOBALAMIN) 1000 MCG TABLET    Take 1,000 mcg by mouth daily.  Modified Medications   No medications on file  Discontinued Medications   AMLODIPINE (NORVASC) 2.5 MG TABLET    Take 1 tablet (2.5 mg total) by mouth daily. in am only with 5 mg     Review of Systems  Constitutional: Negative for fever, activity change, appetite change, fatigue and unexpected weight change.       Recent fall  HENT: Negative for ear pain, hearing loss, mouth sores, sore throat and tinnitus.   Eyes: Negative.   Respiratory: Negative for cough, choking, shortness of breath and wheezing.   Cardiovascular: Negative for chest pain, palpitations and leg swelling.  Gastrointestinal: Positive for diarrhea.  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  Musculoskeletal:       Pain in the right lateral chest wall. Splints  breathing with a deep breath.  Skin:        Scattered ecchymoses on the arms and there is an ecchymotic area right foot medially at the arch. Skin tears of the right arm x 2.  Allergic/Immunologic: Negative.   Neurological: Positive for weakness and numbness. Negative for dizziness, tremors and headaches.  Psychiatric/Behavioral: Negative for hallucinations, behavioral problems, dysphoric mood and agitation. The patient is hyperactive.     Filed Vitals:   11/27/14 1626  BP: 156/80  Pulse: 63  Temp: 98.3 F (36.8 C)  TempSrc: Oral  Height: _0  (1.676 m)  Weight: 130 lb (58.968 kg)  SpO2: 98%   Body mass index is 20.99 kg/(m^2).  Physical Exam  Constitutional: He is oriented to person, place, and time. He appears distressed.  Thin and frail  HENT:  Head: Normocephalic.  Right Ear: External ear normal.  Left Ear: External ear normal.  Nose: Nose normal.  Mouth/Throat: Oropharynx is clear and moist. No oropharyngeal exudate.  Eyes: Conjunctivae and EOM are normal. Pupils are equal, round, and reactive to light.  Neck: No JVD present. No tracheal deviation present. No thyromegaly present.  Cardiovascular: Normal rate, regular rhythm, normal heart sounds and intact distal pulses.  Exam reveals no friction rub.   No murmur heard. Pulmonary/Chest: No respiratory distress. He has no wheezes. He has no rales. He exhibits no tenderness.  Exquisitely tender on the right chest wall about T6-7.  Abdominal: He exhibits no distension and no mass. There is no tenderness.  Genitourinary: Left testis shows tenderness.  Musculoskeletal: Normal range of motion. He exhibits tenderness. He exhibits no edema.  Lymphadenopathy:    He has no cervical adenopathy.  Neurological: He is oriented to person, place, and time. He has normal reflexes. No cranial nerve deficit. Coordination normal.  Skin: No rash noted. No erythema. No pallor.  Skin tear x 2 in the right forearm.  Psychiatric: He has a  normal mood and affect. His behavior is normal. Thought content normal.     Labs reviewed: Office Visit on 11/02/2014  Component Date Value Ref Range Status  . Sed Rate 11/08/2014 44* 0 - 20 mm/hr Final   ** Please note change in reference range(s). **  . Uric Acid, Serum 11/08/2014 5.9  4.0 - 7.8 mg/dL Final  . Rhuematoid fact SerPl-aCnc 11/08/2014 <10  <=14 IU/mL Final   Comment:  Interpretive Table                     Low Positive: 15 - 41 IU/mL                     High Positive:  >= 42 IU/mL    In addition to the RF result, and clinical symptoms including joint  involvement, the 2010 ACR Classification Criteria for  scoring/diagnosing Rheumatoid Arthritis include the results of the  following tests:  CRP (75830), ESR (15010), and CCP (APCA) (74600).  [email or web address]/practice/clinical/classification/ra/ra_2010.asp   . Anit Nuclear Antibody(ANA) 11/08/2014 NEG  NEGATIVE Final     Assessment/Plan  1. Open wound of right upper arm, initial encounter -Cleanse daily with soap and water. Apply dry dressing.  2. Right-sided chest wall pain : Contusion to the right chest wall should resolve without further problems. Use the brace we have provided you as needed for comfort.

## 2014-12-01 MED ORDER — RIB BELT/MENS/ELASTIC REGULAR MISC: Status: DC

## 2014-12-06 ENCOUNTER — Other Ambulatory Visit: Payer: Self-pay | Admitting: Internal Medicine

## 2014-12-07 ENCOUNTER — Encounter: Payer: Medicare Other | Admitting: Podiatry

## 2014-12-10 NOTE — Progress Notes (Signed)
Patient ID: Benjamin Hodge, male   DOB: 1941/08/14, 74 y.o.   MRN: [id number]  No show for appointment.

## 2014-12-12 ENCOUNTER — Other Ambulatory Visit: Payer: Self-pay | Admitting: Internal Medicine

## 2014-12-18 ENCOUNTER — Telehealth: Payer: Self-pay

## 2014-12-18 DIAGNOSIS — H2513 Age-related nuclear cataract, bilateral: Secondary | ICD-10-CM | POA: Diagnosis not present

## 2014-12-18 DIAGNOSIS — E119 Type 2 diabetes mellitus without complications: Secondary | ICD-10-CM | POA: Diagnosis not present

## 2014-12-18 NOTE — Telephone Encounter (Signed)
Called to reschedule appt on  due to change in provider's schedule. No answer.

## 2014-12-21 ENCOUNTER — Other Ambulatory Visit: Payer: Self-pay

## 2014-12-21 MED ORDER — HYDROCODONE-ACETAMINOPHEN 5-325 MG PO TABS: ORAL_TABLET | ORAL | Status: DC

## 2014-12-24 ENCOUNTER — Ambulatory Visit: Payer: Self-pay | Admitting: Internal Medicine

## 2014-12-25 NOTE — Telephone Encounter (Signed)
Called patient back had to leave a voice mail X 2 . When patient call's back please schedule Him with Eber Jones first available in May. CM/Doh. Patient was on the schedule for May 5th. Thanks Annabelle Harman.

## 2014-12-31 ENCOUNTER — Other Ambulatory Visit: Payer: Medicare Other

## 2014-12-31 DIAGNOSIS — F1010 Alcohol abuse, uncomplicated: Secondary | ICD-10-CM | POA: Diagnosis not present

## 2014-12-31 DIAGNOSIS — D649 Anemia, unspecified: Secondary | ICD-10-CM | POA: Diagnosis not present

## 2014-12-31 DIAGNOSIS — F1020 Alcohol dependence, uncomplicated: Secondary | ICD-10-CM | POA: Diagnosis not present

## 2014-12-31 DIAGNOSIS — D539 Nutritional anemia, unspecified: Secondary | ICD-10-CM | POA: Diagnosis not present

## 2014-12-31 DIAGNOSIS — Z9181 History of falling: Secondary | ICD-10-CM | POA: Diagnosis not present

## 2014-12-31 DIAGNOSIS — E1042 Type 1 diabetes mellitus with diabetic polyneuropathy: Secondary | ICD-10-CM | POA: Diagnosis not present

## 2015-01-01 ENCOUNTER — Other Ambulatory Visit: Payer: Medicare Other

## 2015-01-01 DIAGNOSIS — E1042 Type 1 diabetes mellitus with diabetic polyneuropathy: Secondary | ICD-10-CM | POA: Diagnosis not present

## 2015-01-01 DIAGNOSIS — IMO0002 Reserved for concepts with insufficient information to code with codable children: Secondary | ICD-10-CM

## 2015-01-01 DIAGNOSIS — E1049 Type 1 diabetes mellitus with other diabetic neurological complication: Secondary | ICD-10-CM

## 2015-01-01 DIAGNOSIS — R58 Hemorrhage, not elsewhere classified: Secondary | ICD-10-CM

## 2015-01-01 DIAGNOSIS — E1041 Type 1 diabetes mellitus with diabetic mononeuropathy: Secondary | ICD-10-CM | POA: Diagnosis not present

## 2015-01-01 DIAGNOSIS — E1065 Type 1 diabetes mellitus with hyperglycemia: Secondary | ICD-10-CM

## 2015-01-01 DIAGNOSIS — E1043 Type 1 diabetes mellitus with diabetic autonomic (poly)neuropathy: Secondary | ICD-10-CM

## 2015-01-01 DIAGNOSIS — E785 Hyperlipidemia, unspecified: Secondary | ICD-10-CM | POA: Diagnosis not present

## 2015-01-01 DIAGNOSIS — G990 Autonomic neuropathy in diseases classified elsewhere: Secondary | ICD-10-CM | POA: Diagnosis not present

## 2015-01-02 ENCOUNTER — Ambulatory Visit (INDEPENDENT_AMBULATORY_CARE_PROVIDER_SITE_OTHER): Payer: Medicare Other | Admitting: Internal Medicine

## 2015-01-02 ENCOUNTER — Encounter: Payer: Self-pay | Admitting: Internal Medicine

## 2015-01-02 VITALS — BP 130/62 | HR 59 | Temp 97.9°F | Ht 66.0 in | Wt 125.6 lb

## 2015-01-02 DIAGNOSIS — J449 Chronic obstructive pulmonary disease, unspecified: Secondary | ICD-10-CM

## 2015-01-02 DIAGNOSIS — E1049 Type 1 diabetes mellitus with other diabetic neurological complication: Secondary | ICD-10-CM

## 2015-01-02 DIAGNOSIS — I10 Essential (primary) hypertension: Secondary | ICD-10-CM

## 2015-01-02 DIAGNOSIS — N183 Chronic kidney disease, stage 3 unspecified: Secondary | ICD-10-CM

## 2015-01-02 DIAGNOSIS — R7989 Other specified abnormal findings of blood chemistry: Secondary | ICD-10-CM

## 2015-01-02 DIAGNOSIS — E785 Hyperlipidemia, unspecified: Secondary | ICD-10-CM | POA: Diagnosis not present

## 2015-01-02 DIAGNOSIS — K589 Irritable bowel syndrome without diarrhea: Secondary | ICD-10-CM

## 2015-01-02 DIAGNOSIS — E1051 Type 1 diabetes mellitus with diabetic peripheral angiopathy without gangrene: Secondary | ICD-10-CM

## 2015-01-02 DIAGNOSIS — E1041 Type 1 diabetes mellitus with diabetic mononeuropathy: Secondary | ICD-10-CM | POA: Diagnosis not present

## 2015-01-02 DIAGNOSIS — E10329 Type 1 diabetes mellitus with mild nonproliferative diabetic retinopathy without macular edema: Secondary | ICD-10-CM

## 2015-01-02 DIAGNOSIS — F908 Attention-deficit hyperactivity disorder, other type: Secondary | ICD-10-CM

## 2015-01-02 DIAGNOSIS — I739 Peripheral vascular disease, unspecified: Secondary | ICD-10-CM

## 2015-01-02 DIAGNOSIS — E103299 Type 1 diabetes mellitus with mild nonproliferative diabetic retinopathy without macular edema, unspecified eye: Secondary | ICD-10-CM

## 2015-01-02 DIAGNOSIS — E039 Hypothyroidism, unspecified: Secondary | ICD-10-CM

## 2015-01-02 DIAGNOSIS — IMO0002 Reserved for concepts with insufficient information to code with codable children: Secondary | ICD-10-CM

## 2015-01-02 DIAGNOSIS — D649 Anemia, unspecified: Secondary | ICD-10-CM

## 2015-01-02 DIAGNOSIS — E291 Testicular hypofunction: Secondary | ICD-10-CM | POA: Diagnosis not present

## 2015-01-02 DIAGNOSIS — E1059 Type 1 diabetes mellitus with other circulatory complications: Secondary | ICD-10-CM

## 2015-01-02 DIAGNOSIS — F900 Attention-deficit hyperactivity disorder, predominantly inattentive type: Secondary | ICD-10-CM

## 2015-01-02 DIAGNOSIS — I2510 Atherosclerotic heart disease of native coronary artery without angina pectoris: Secondary | ICD-10-CM

## 2015-01-02 DIAGNOSIS — E1065 Type 1 diabetes mellitus with hyperglycemia: Secondary | ICD-10-CM

## 2015-01-02 DIAGNOSIS — R0789 Other chest pain: Secondary | ICD-10-CM | POA: Diagnosis not present

## 2015-01-02 DIAGNOSIS — E349 Endocrine disorder, unspecified: Secondary | ICD-10-CM

## 2015-01-02 HISTORY — DX: Type 1 diabetes mellitus with diabetic peripheral angiopathy without gangrene: E10.51

## 2015-01-02 LAB — CBC WITH DIFFERENTIAL
Basophils Absolute: 0 10*3/uL (ref 0.0–0.2)
Basos: 1 %
EOS (ABSOLUTE): 0.2 10*3/uL (ref 0.0–0.4)
Eos: 3 %
Hematocrit: 32.8 % — ABNORMAL LOW (ref 37.5–51.0)
Hemoglobin: 10.5 g/dL — ABNORMAL LOW (ref 12.6–17.7)
IMMATURE GRANS (ABS): 0 10*3/uL (ref 0.0–0.1)
Immature Granulocytes: 0 %
LYMPHS: 16 %
Lymphocytes Absolute: 1 10*3/uL (ref 0.7–3.1)
MCH: 30.9 pg (ref 26.6–33.0)
MCHC: 32 g/dL (ref 31.5–35.7)
MCV: 97 fL (ref 79–97)
MONOCYTES: 10 %
MONOS ABS: 0.6 10*3/uL (ref 0.1–0.9)
NEUTROS PCT: 70 %
Neutrophils Absolute: 4.2 10*3/uL (ref 1.4–7.0)
RBC: 3.4 x10E6/uL — AB (ref 4.14–5.80)
RDW: 14.3 % (ref 12.3–15.4)
WBC: 6 10*3/uL (ref 3.4–10.8)

## 2015-01-02 LAB — COMPREHENSIVE METABOLIC PANEL
A/G RATIO: 1.9 (ref 1.1–2.5)
ALK PHOS: 87 IU/L (ref 39–117)
ALT: 22 IU/L (ref 0–44)
AST: 29 IU/L (ref 0–40)
Albumin: 3.9 g/dL (ref 3.5–4.8)
BUN / CREAT RATIO: 18 (ref 10–22)
BUN: 20 mg/dL (ref 8–27)
Bilirubin Total: <0.2 mg/dL (ref 0.0–1.2)
CALCIUM: 8.3 mg/dL — AB (ref 8.6–10.2)
CHLORIDE: 102 mmol/L (ref 97–108)
CO2: 25 mmol/L (ref 18–29)
CREATININE: 1.09 mg/dL (ref 0.76–1.27)
GFR calc Af Amer: 79 mL/min/{1.73_m2} (ref >59)
GFR calc non Af Amer: 68 mL/min/{1.73_m2} (ref >59)
GLOBULIN, TOTAL: 2.1 g/dL (ref 1.5–4.5)
Glucose: 368 mg/dL — ABNORMAL HIGH (ref 65–99)
Potassium: 5.7 mmol/L — ABNORMAL HIGH (ref 3.5–5.2)
Sodium: 137 mmol/L (ref 134–144)
Total Protein: 6 g/dL (ref 6.0–8.5)

## 2015-01-02 LAB — LIPID PANEL
Chol/HDL Ratio: 2.3 ratio units (ref 0.0–5.0)
Cholesterol, Total: 192 mg/dL (ref 100–199)
HDL: 84 mg/dL (ref >39)
LDL CALC: 89 mg/dL (ref 0–99)
Triglycerides: 96 mg/dL (ref 0–149)
VLDL Cholesterol Cal: 19 mg/dL (ref 5–40)

## 2015-01-02 LAB — HEMOGLOBIN A1C
ESTIMATED AVERAGE GLUCOSE: 266 mg/dL
HEMOGLOBIN A1C: 10.9 % — AB (ref 4.8–5.6)

## 2015-01-02 LAB — MICROALBUMIN, URINE: MICROALBUM., U, RANDOM: 55.1 ug/mL — AB (ref 0.0–17.0)

## 2015-01-02 MED ORDER — TESTOSTERONE CYPIONATE 200 MG/ML IM SOLN: INTRAMUSCULAR | Status: DC

## 2015-01-02 NOTE — Progress Notes (Signed)
Patient ID: Benjamin Hodge, male   DOB: 1941/08/13, 74 y.o.   MRN: [id number]    Facility  [facility]    Place of Service:   OFFICE    Allergies  Allergen Reactions  . Lipitor [Atorvastatin] Other (See Comments)    Caused pain all over body.  . Ace Inhibitors Other (See Comments)    dizzy  . Angiotensin Receptor Blockers Other (See Comments)    unknown  . Bee Venom Swelling  . Cymbalta [Duloxetine Hcl] Other (See Comments)    dizzy    Chief Complaint  Patient presents with  . Medical Management of Chronic Issues    4 Month Follow Up. 6CIT Dementia test performed for the [employer] and scored 0 (Zero)=Normal -referral not necessary at present.    HPI:  Testosterone deficiency: Patient previously took testosterone injections. Review of previous lab work shows testosterone deficiency 3 years ago at 238.76 ng/dL and 5 years ago at 267.43 ng/dL. It returned to normal range one year ago at 414.87 ng/dL. He was taking testosterone injections at that time. He discontinued the testosterone injections because of the expense, but believes he may be able to find a way to resume these injections if they are justifiable.  Type 1 diabetes mellitus with neurological manifestations, uncontrolled -poorly controlled diabetes with last A1c at 10.2. He is under the care Dr. Buddy Duty, endocrinologist.   Hypothyroidism, unspecified hypothyroidism type - compensated with levothyroxin  Essential hypertension: Controlled  Chronic renal failure, stage 3 (moderate): Previously recorded elevations in BUN and creatinine have returned to normal. GFR is also in a normal range.  Dyslipidemia - controlled  Anemia, unspecified anemia type: Persistent anemia that is stable. Last hemoglobin 10.5.  Coronary artery disease involving native coronary artery of native heart without angina pectoris: Denies angina or palpitations. Stable coronary artery disease.  ADHD, adult residual type: Continues with hyperactive type  behavior. Although he is on antidepressants, he is not on medication specifically for his ADHD.  Chronic obstructive pulmonary disease, unspecified COPD, unspecified chronic bronchitis type: Stable. Denies cough. Has some dyspnea on exertion.  Irritable bowel syndrome: Previously associated with diarrhea. This is now resolved problem. IBS seems quite stable.  Right-sided chest wall pain: Resolved  Diabetes mellitus type 1 with peripheral artery disease: Diminished circulation in legs. Does not seem to have claudication at this time.  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   ASPIRIN 81 MG TABLET    Take 81 mg by mouth daily.    CLOPIDOGREL (PLAVIX) 75 MG TABLET    Take 1 tablet (75 mg total) by mouth daily with breakfast. Blood Thinner   CONTINUOUS GLUCOSE MONITOR (DEXCOM G4 PLATINUM RECEIVER) KIT    USE AS DIRECTED   DIPHENOXYLATE-ATROPINE (LOMOTIL) 2.5-0.025 MG PER TABLET    TAKE ONE OR TWO TABLETS BY MOUTH TWICE DAILY AS NEEDED    ELASTIC BANDAGES & SUPPORTS (RIB BELT/MENS/ELASTIC REGULAR) MISC    Use as needed for discomfort   ESCITALOPRAM (LEXAPRO) 20 MG TABLET    TAKE ONE TABLET BY MOUTH ONE TIME DAILY    GABAPENTIN (NEURONTIN) 600 MG TABLET    TAKE ONE TABLET BY MOUTH THREE TIMES DAILY    HYDROCODONE-ACETAMINOPHEN (NORCO/VICODIN) 5-325 MG PER TABLET  Take one tablet by mouth five times daily as needed for pain   INSULIN GLARGINE (TOUJEO SOLOSTAR) 300 UNIT/ML SOPN    Inject 10 Units into the skin daily.   INSULIN LISPRO (HUMALOG) 100 UNIT/ML KIWKPEN    Inject 1-6 Units into the skin daily as needed (after meals).    INSULIN PEN NEEDLE (BD PEN NEEDLE NANO U/F) 32G X 4 MM MISC    1 each by Does not apply route  3 (three) times daily.   LEVOTHYROXINE (SYNTHROID, LEVOTHROID) 75 MCG TABLET    TAKE ONE TABLET BY MOUTH ONE TIME DAILY    METHYLPHENIDATE (RITALIN) 20 MG TABLET    Take 1 tablet (20 mg total) by mouth 2 (two) times daily.   METOCLOPRAMIDE (REGLAN) 5 MG TABLET    USE ONCE A DAY AFTER LUCHTIME MEAL   METOPROLOL TARTRATE (LOPRESSOR) 25 MG TABLET    TAKE ONE TABLET BY MOUTH TWICE DAILY    ONE TOUCH ULTRA TEST TEST STRIP    TEST 8 TIMES PER DAY FOR 30 DAYS   PANTOPRAZOLE (PROTONIX) 40 MG TABLET    Take one tablet by mouth once daily for stomach   PRAVASTATIN (PRAVACHOL) 20 MG TABLET    Take 1 tablet (20 mg total) by mouth every evening.   PREGABALIN (LYRICA) 50 MG CAPSULE    1-2 capsules at bed to help neuropathic pain   VITAMIN B-12 (CYANOCOBALAMIN) 1000 MCG TABLET    Take 1,000 mcg by mouth daily.  Modified Medications   No medications on file  Discontinued Medications   No medications on file     Review of Systems  Constitutional: Negative for fever, activity change, appetite change, fatigue and unexpected weight change.       Recent fall  HENT: Negative for ear pain, hearing loss, mouth sores, sore throat and tinnitus.   Eyes: Negative.   Respiratory: Negative for cough, choking, shortness of breath and wheezing.   Cardiovascular: Negative for chest pain, palpitations and leg swelling.  Gastrointestinal: Positive for diarrhea.  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  Musculoskeletal:       Pain in the right lateral chest wall. Splints breathing with a deep breath.  Skin:        Scattered ecchymoses on the arms and there is an ecchymotic area right foot medially at the arch. Skin tears of the right arm x 2.  Allergic/Immunologic: Negative.   Neurological: Positive for weakness and numbness. Negative for dizziness, tremors and headaches.  Psychiatric/Behavioral: Negative for hallucinations, behavioral  problems, dysphoric mood and agitation. The patient is hyperactive.     Filed Vitals:   01/02/15 1611  BP: 130/62  Pulse: 59  Temp: 97.9 F (36.6 C)  TempSrc: Oral  Height: 5' 6"  (1.676 m)  Weight: 125 lb 9.6 oz (56.972 kg)   Body mass index is 20.28 kg/(m^2).  Physical Exam  Constitutional: He is oriented to person, place, and time. He appears distressed.  Thin and frail  HENT:  Head: Normocephalic.  Right Ear: External ear normal.  Left Ear: External ear normal.  Nose: Nose normal.  Mouth/Throat: Oropharynx is clear and moist. No oropharyngeal exudate.  Eyes: Conjunctivae and EOM are normal. Pupils are equal, round, and reactive to light.  History of nonproliferative diabetic retinopathy.  Neck: No JVD present. No tracheal deviation present. No thyromegaly present.  Cardiovascular: Normal rate, regular rhythm and normal heart sounds.  Exam reveals  no friction rub.   No murmur heard. Diminished pulsations in dorsalis pedis and posterior tibial arteries bilaterally  Pulmonary/Chest: No respiratory distress. He has no wheezes. He has no rales. He exhibits no tenderness.  Exquisitely tender on the right chest wall about T6-7.  Abdominal: He exhibits no distension and no mass. There is no tenderness.  Genitourinary: Left testis shows tenderness.  Musculoskeletal: Normal range of motion. He exhibits tenderness. He exhibits no edema.  Lymphadenopathy:    He has no cervical adenopathy.  Neurological: He is oriented to person, place, and time. He has normal reflexes. No cranial nerve deficit. Coordination normal.  Diminished sensation to vibration and monofilament testing bilaterally  Skin: No rash noted. No erythema. No pallor.  Skin tear x 2 in the right forearm from a cat scratch.  Psychiatric: He has a normal mood and affect. His behavior is normal. Thought content normal.     Labs reviewed: Appointment on 01/01/2015  Component Date Value Ref Range Status  . Cholesterol,  Total 01/01/2015 192  100 - 199 mg/dL Final  . Triglycerides 01/01/2015 96  0 - 149 mg/dL Final  . HDL 01/01/2015 84  >39 mg/dL Final   Comment: According to ATP-III Guidelines, HDL-C >59 mg/dL is considered a negative risk factor for CHD.   Marland Kitchen VLDL Cholesterol Cal 01/01/2015 19  5 - 40 mg/dL Final  . LDL Calculated 01/01/2015 89  0 - 99 mg/dL Final  . Chol/HDL Ratio 01/01/2015 2.3  0.0 - 5.0 ratio units Final   Comment:                                   T. Chol/HDL Ratio                                             Men  Women                               1/2 Avg.Risk  3.4    3.3                                   Avg.Risk  5.0    4.4                                2X Avg.Risk  9.6    7.1                                3X Avg.Risk 23.4   11.0   . Hgb A1c MFr Bld 01/01/2015 10.9* 4.8 - 5.6 % Final   Comment:          Pre-diabetes: 5.7 - 6.4          Diabetes: >6.4          Glycemic control for adults with diabetes: <7.0   . Est. average glucose Bld gHb Est-m* 01/01/2015 266   Final  . Glucose 01/01/2015 368* 65 - 99 mg/dL Final  . BUN 01/01/2015 20  8 - 27 mg/dL Final  . Creatinine, Ser 01/01/2015 1.09  0.76 - 1.27 mg/dL Final  . GFR calc non Af Amer 01/01/2015 68  >59 mL/min/1.73 Final  . GFR calc Af Amer 01/01/2015 79  >59 mL/min/1.73 Final  . BUN/Creatinine Ratio 01/01/2015 18  10 - 22 Final  . Sodium 01/01/2015 137  134 - 144 mmol/L Final  . Potassium 01/01/2015 5.7* 3.5 - 5.2 mmol/L Final  . Chloride 01/01/2015 102  97 - 108 mmol/L Final  . CO2 01/01/2015 25  18 - 29 mmol/L Final  . Calcium 01/01/2015 8.3* 8.6 - 10.2 mg/dL Final  . Total Protein 01/01/2015 6.0  6.0 - 8.5 g/dL Final  . Albumin 01/01/2015 3.9  3.5 - 4.8 g/dL Final  . Globulin, Total 01/01/2015 2.1  1.5 - 4.5 g/dL Final  . Albumin/Globulin Ratio 01/01/2015 1.9  1.1 - 2.5 Final  . Bilirubin Total 01/01/2015 <0.2  0.0 - 1.2 mg/dL Final  . Alkaline Phosphatase 01/01/2015 87  39 - 117 IU/L Final  . AST 01/01/2015 29   0 - 40 IU/L Final  . ALT 01/01/2015 22  0 - 44 IU/L Final  . Microalbum.,U,Random 01/01/2015 55.1* 0.0 - 17.0 ug/mL Final   Comment: **Effective Jan 14, 2015 the reference interval for**   Microalbumin, Urine will be changing to:                                         Not Estab.   . WBC 01/01/2015 6.0  3.4 - 10.8 x10E3/uL Final  . RBC 01/01/2015 3.40* 4.14 - 5.80 x10E6/uL Final  . Hemoglobin 01/01/2015 10.5* 12.6 - 17.7 g/dL Final  . Hematocrit 01/01/2015 32.8* 37.5 - 51.0 % Final  . MCV 01/01/2015 97  79 - 97 fL Final  . MCH 01/01/2015 30.9  26.6 - 33.0 pg Final  . MCHC 01/01/2015 32.0  31.5 - 35.7 g/dL Final  . RDW 01/01/2015 14.3  12.3 - 15.4 % Final  . NEUTROPHILS 01/01/2015 70   Final  . Lymphs 01/01/2015 16   Final  . Monocytes 01/01/2015 10   Final  . Eos 01/01/2015 3   Final  . Basos 01/01/2015 1   Final  . Neutrophils Absolute 01/01/2015 4.2  1.4 - 7.0 x10E3/uL Final  . Lymphocytes Absolute 01/01/2015 1.0  0.7 - 3.1 x10E3/uL Final  . Monocytes Absolute 01/01/2015 0.6  0.1 - 0.9 x10E3/uL Final  . EOS (ABSOLUTE) 01/01/2015 0.2  0.0 - 0.4 x10E3/uL Final  . Basophils Absolute 01/01/2015 0.0  0.0 - 0.2 x10E3/uL Final  . Immature Granulocytes 01/01/2015 0   Final  . Immature Grans (Abs) 01/01/2015 0.0  0.0 - 0.1 x10E3/uL Final  Office Visit on 11/02/2014  Component Date Value Ref Range Status  . Sed Rate 11/08/2014 44* 0 - 20 mm/hr Final   ** Please note change in reference range(s). **  . Uric Acid, Serum 11/08/2014 5.9  4.0 - 7.8 mg/dL Final  . Rhuematoid fact SerPl-aCnc 11/08/2014 <10  <=14 IU/mL Final   Comment:                            Interpretive Table                     Low Positive: 15 - 41 IU/mL  High Positive:  >= 42 IU/mL    In addition to the RF result, and clinical symptoms including joint  involvement, the 2010 ACR Classification Criteria for  scoring/diagnosing Rheumatoid Arthritis include the results of the  following tests:  CRP  (09470), ESR (15010), and CCP (APCA) (96283).  www.rheumatology.org/practice/clinical/classification/ra/ra_2010.asp   . Anit Nuclear Antibody(ANA) 11/08/2014 NEG  NEGATIVE Final     Assessment/Plan  1. Testosterone deficiency Prescription given patient for testosterone cypionate  2. Type 1 diabetes mellitus with neurological manifestations, uncontrolled - Hemoglobin A1c; Future - Comprehensive metabolic panel; Future - Microalbumin, urine; Future  3. Hypothyroidism, unspecified hypothyroidism type - TSH; Future  4. Essential hypertension Continue current medications  5. Chronic renal failure, stage 3 (moderate) Diagnosis deleted based on current lab.  6. Dyslipidemia Controlled - Lipid panel; Future  7. Anemia, unspecified anemia type Continue to monitor lab  8. Coronary artery disease involving native coronary artery of native heart without angina pectoris Stable  9. ADHD, adult residual type Unchanged  10. Chronic obstructive pulmonary disease, unspecified COPD, unspecified chronic bronchitis type Improved  11. Irritable bowel syndrome Improved  12. Low serum testosterone level Patient was told if he picks up this prescription, we will arrange to give him his injections here at the office - testosterone cypionate (DEPOTESTOTERONE CYPIONATE) 200 MG/ML injection; Inject 200 mg every 14 days  Dispense: 10 mL; Refill: 0  13. Right-sided chest wall pain Resolved  14. Diabetes mellitus type 1 with peripheral artery disease Continue to observe  15. 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

## 2015-01-07 ENCOUNTER — Ambulatory Visit: Payer: 59 | Admitting: Podiatry

## 2015-01-10 ENCOUNTER — Ambulatory Visit: Payer: Medicare Other | Admitting: Nurse Practitioner

## 2015-01-21 ENCOUNTER — Other Ambulatory Visit: Payer: Self-pay | Admitting: *Deleted

## 2015-01-21 MED ORDER — HYDROCODONE-ACETAMINOPHEN 5-325 MG PO TABS: ORAL_TABLET | ORAL | Status: DC

## 2015-01-21 NOTE — Telephone Encounter (Signed)
Patient Requested and will pick up 

## 2015-01-22 ENCOUNTER — Ambulatory Visit: Payer: Medicare Other | Admitting: Physical Therapy

## 2015-01-30 ENCOUNTER — Encounter: Payer: Self-pay | Admitting: Podiatry

## 2015-01-30 ENCOUNTER — Ambulatory Visit (INDEPENDENT_AMBULATORY_CARE_PROVIDER_SITE_OTHER): Payer: Medicare Other

## 2015-01-30 ENCOUNTER — Ambulatory Visit (INDEPENDENT_AMBULATORY_CARE_PROVIDER_SITE_OTHER): Payer: Medicare Other | Admitting: Podiatry

## 2015-01-30 ENCOUNTER — Ambulatory Visit: Payer: Medicare Other

## 2015-01-30 VITALS — BP 179/85 | HR 66 | Temp 96.8°F | Resp 16

## 2015-01-30 DIAGNOSIS — E1065 Type 1 diabetes mellitus with hyperglycemia: Secondary | ICD-10-CM | POA: Diagnosis not present

## 2015-01-30 DIAGNOSIS — S92912D Unspecified fracture of left toe(s), subsequent encounter for fracture with routine healing: Secondary | ICD-10-CM | POA: Diagnosis not present

## 2015-01-30 DIAGNOSIS — L97522 Non-pressure chronic ulcer of other part of left foot with fat layer exposed: Secondary | ICD-10-CM

## 2015-01-30 DIAGNOSIS — Z794 Long term (current) use of insulin: Secondary | ICD-10-CM | POA: Diagnosis not present

## 2015-01-30 DIAGNOSIS — L03032 Cellulitis of left toe: Secondary | ICD-10-CM

## 2015-01-30 DIAGNOSIS — F1010 Alcohol abuse, uncomplicated: Secondary | ICD-10-CM | POA: Diagnosis not present

## 2015-01-30 DIAGNOSIS — E1042 Type 1 diabetes mellitus with diabetic polyneuropathy: Secondary | ICD-10-CM | POA: Diagnosis not present

## 2015-01-30 DIAGNOSIS — R52 Pain, unspecified: Secondary | ICD-10-CM

## 2015-01-30 MED ORDER — DOXYCYCLINE HYCLATE 100 MG PO TABS: 100.0000 mg | ORAL_TABLET | Freq: Two times a day (BID) | ORAL | Status: DC

## 2015-02-01 ENCOUNTER — Other Ambulatory Visit: Payer: Self-pay | Admitting: Internal Medicine

## 2015-02-03 NOTE — Progress Notes (Signed)
Patient ID: Benjamin Hodge, male   DOB: Jan 04, 1941, 74 y.o.   MRN: [id number]  Subjective: 74 year old male presents the office today with concerns of his left big toe is not bending correctly and is at an angle. He states he is also notices left third toe is becoming red and he is concerned for infection. He denies any ulceration or any problems to the toe. Denies any systemic complaints such as fevers, chills, nausea, vomiting. No acute changes since last appointment, and no other complaints at this time.   Objective: AAO x3, NAD DP/PT pulses palpable 1/4 bilaterally, CRT less than 3 seconds Protective sensation absent with Dorann Ou monofilament, vibratory sensation absent, Achilles tendon reflex intact This slight abduction of the hallux and mild decreased range of motion of the first MTPJ in a hallux IPJ. There is no overlying edema, erythema, increased warmth. The left second digit is edematous, erythematous. Upon evaluation the plantar aspect of the digit along the sulcus of the PIPJ there is a laceration appearing type wound transversing the plantar aspect of the digit which probes to tendon. The wound does probe close to bone however does not touch bone. There is no drainage or purulence. No areas of fluctuance or crepitus. No ascending cellulitis, no malodor. No areas of pinpoint bony tenderness or pain with vibratory sensation. No edema, erythema, increase in warmth to bilateral lower extremities.  No open lesions or pre-ulcerative lesions.  No pain with calf compression, swelling, warmth, erythema  Assessment: 74 year old male left third digit cellulitis, ulceration  Plan: -X-rays were obtained and reviewed with the patient.  -All treatment options discussed with the patient including all alternatives, risks, complications.  -Discussed the patient that he is at high risk of amputation of the left third toe to the wound and the infection. I recommended the patient continue  daily dressing changes with antibody ointment and a bandage daily. I also dispensed him a splint for the toe to help keep the toe straight in order to get the wound to heal on the plantar aspect of the toe as it is in the sulcus. Strongly encouraged the patient to call the office immediately if there is any worsening signs or symptoms of infection or to go directly to the emergency room. -Discussed likely etiology of the deformity to the hallux that she recently has broken this toe and it healed in this position. -Follow-up in 1 week or sooner if any problems are to arise. -Patient encouraged to call the office with any questions, concerns, change in symptoms.

## 2015-02-05 ENCOUNTER — Other Ambulatory Visit: Payer: Self-pay | Admitting: Neurology

## 2015-02-05 ENCOUNTER — Telehealth: Payer: Self-pay

## 2015-02-05 DIAGNOSIS — G629 Polyneuropathy, unspecified: Secondary | ICD-10-CM

## 2015-02-05 DIAGNOSIS — G901 Familial dysautonomia [Riley-Day]: Secondary | ICD-10-CM

## 2015-02-05 DIAGNOSIS — E1140 Type 2 diabetes mellitus with diabetic neuropathy, unspecified: Secondary | ICD-10-CM

## 2015-02-05 DIAGNOSIS — E1149 Type 2 diabetes mellitus with other diabetic neurological complication: Principal | ICD-10-CM

## 2015-02-05 MED ORDER — METHYLPHENIDATE HCL 20 MG PO TABS: 20.0000 mg | ORAL_TABLET | Freq: Two times a day (BID) | ORAL | Status: DC

## 2015-02-05 NOTE — Telephone Encounter (Signed)
Request entered, forwarded to provider for approval.  

## 2015-02-05 NOTE — Telephone Encounter (Signed)
Called pt to inform him that his RX is ready for pick up at the front desk. Pt verbalized understanding.

## 2015-02-05 NOTE — Telephone Encounter (Signed)
Patient called requesting a refill for methylphenidate (RITALIN) 20 MG tablet. Patient was advised that the script will be ready for pick up in 24 hrs unless he hears otherwise by the nurse.  Patient can be reached @ [phone]

## 2015-02-06 ENCOUNTER — Ambulatory Visit (INDEPENDENT_AMBULATORY_CARE_PROVIDER_SITE_OTHER): Payer: Medicare Other | Admitting: Podiatry

## 2015-02-06 ENCOUNTER — Encounter: Payer: Self-pay | Admitting: Podiatry

## 2015-02-06 VITALS — BP 113/68 | HR 87 | Resp 12

## 2015-02-06 DIAGNOSIS — L97524 Non-pressure chronic ulcer of other part of left foot with necrosis of bone: Secondary | ICD-10-CM

## 2015-02-06 DIAGNOSIS — R0989 Other specified symptoms and signs involving the circulatory and respiratory systems: Secondary | ICD-10-CM

## 2015-02-06 MED ORDER — CIPROFLOXACIN HCL 500 MG PO TABS: 500.0000 mg | ORAL_TABLET | Freq: Two times a day (BID) | ORAL | Status: DC

## 2015-02-06 MED ORDER — CLINDAMYCIN HCL 300 MG PO CAPS: 300.0000 mg | ORAL_CAPSULE | Freq: Three times a day (TID) | ORAL | Status: DC

## 2015-02-07 ENCOUNTER — Encounter: Payer: Self-pay | Admitting: Podiatry

## 2015-02-07 NOTE — Progress Notes (Signed)
Patient ID: Benjamin Hodge, male   DOB: [birth year], 74 y.o.   MRN: [id number]  Subjective: 74 year old male presents the office they for follow-up evaluation of left third toe swelling, redness. He states he has continued antibiotic although still does remain swollen and red still. He hasn't been to the redness is quite intense is what was previously although it does persist. He has not been wearing the surgical shoe or the brace which was given to him last appointment. He states that he has been trying to apply bandage the toe daily all of the does go times without a bandage. He denies any drainage or purulence from the toe. Denies any systemic complaints such as fevers, chills, nausea, vomiting. No acute changes since last appointment, and no other complaints at this time.   Objective: AAO x3, NAD DP/PT pulses decreased bilaterally, CRT less than 3 seconds Protective sensation absent with Dorann Ou monofilament Left third digit is edematous and erythematous the level of the MTPJ distally. There is no ascending cellulitis. Ulceration on the plantar aspect of the toe at the level of the PIPJ which transverses the digit does probe to bone at this time and the flexor tendon is exposed. There is no undermining or tunneling. There is no drainage or purulence. No malodor. No areas of flexions or crepitus. No areas of tenderness or pain with vibratory sensation. . No edema, erythema, increase in warmth to bilateral lower extremities.  No other open lesions or pre-ulcerative lesions.  No pain with calf compression, swelling, warmth, erythema  Assessment: 74 year old male left third digit cellulitis, ulceration which probes to bone  Plan: -All treatment options discussed with the patient including all alternatives, risks, complications.  -At this, discussed likelihood of amputation of the digit with the patient in the near future. Given decreased vascular status we'll obtain vascular  studies. -Prescribed clindamycin and ciprofloxacin. Discontinue doxycycline.  -Continue with daily dressing changes.  -Monitor closely for any worsening signs or symptoms of infection drilled to call the office immediately if any are to occur or go directly to the emergency room. -Follow-up in 1 week, or sooner should any problems arise.  -Patient encouraged to call the office with any questions, concerns, change in symptoms.

## 2015-02-08 ENCOUNTER — Telehealth (HOSPITAL_COMMUNITY): Payer: Self-pay | Admitting: *Deleted

## 2015-02-12 ENCOUNTER — Other Ambulatory Visit: Payer: Self-pay | Admitting: Internal Medicine

## 2015-02-13 ENCOUNTER — Ambulatory Visit (HOSPITAL_COMMUNITY)
Admission: RE | Admit: 2015-02-13 | Discharge: 2015-02-13 | Disposition: A | Discharge status: Home / Self Care | Payer: Medicare Other | Source: Ambulatory Visit | Attending: Cardiovascular Disease | Admitting: Cardiovascular Disease

## 2015-02-13 DIAGNOSIS — R0989 Other specified symptoms and signs involving the circulatory and respiratory systems: Secondary | ICD-10-CM

## 2015-02-14 ENCOUNTER — Ambulatory Visit: Payer: Medicare Other | Admitting: Nurse Practitioner

## 2015-02-15 ENCOUNTER — Telehealth: Payer: Self-pay

## 2015-02-15 ENCOUNTER — Ambulatory Visit (INDEPENDENT_AMBULATORY_CARE_PROVIDER_SITE_OTHER): Payer: Medicare Other | Admitting: Podiatry

## 2015-02-15 ENCOUNTER — Encounter: Payer: Self-pay | Admitting: Podiatry

## 2015-02-15 ENCOUNTER — Ambulatory Visit (INDEPENDENT_AMBULATORY_CARE_PROVIDER_SITE_OTHER): Payer: Medicare Other

## 2015-02-15 VITALS — BP 112/49 | HR 59 | Resp 12

## 2015-02-15 DIAGNOSIS — I739 Peripheral vascular disease, unspecified: Secondary | ICD-10-CM

## 2015-02-15 DIAGNOSIS — M79672 Pain in left foot: Secondary | ICD-10-CM

## 2015-02-15 DIAGNOSIS — L97524 Non-pressure chronic ulcer of other part of left foot with necrosis of bone: Secondary | ICD-10-CM

## 2015-02-15 MED ORDER — HYDROCODONE-ACETAMINOPHEN 5-325 MG PO TABS: ORAL_TABLET | ORAL | Status: DC

## 2015-02-15 MED ORDER — SILVER SULFADIAZINE 1 % EX CREA: 1.0000 "application " | TOPICAL_CREAM | Freq: Every day | CUTANEOUS | Status: DC

## 2015-02-15 NOTE — Telephone Encounter (Signed)
Called at 4:05, asking for written Rx for Hydrocodone not due until 02/19/15 going out of town today for a week to [place]. Will print on prescription don't fill until 14th.

## 2015-02-15 NOTE — Patient Instructions (Signed)
Continue antibiotics Continue daily dressing changes with silvadene and a dry dressing. Do this everyday If you go to the beach do not get in the water and do not get sand on your feet.   Monitor for any signs/symptoms of infection. Call the office immediately if any occur or go directly to the emergency room. Call with any questions/concerns.

## 2015-02-18 ENCOUNTER — Ambulatory Visit (INDEPENDENT_AMBULATORY_CARE_PROVIDER_SITE_OTHER): Payer: Medicare Other | Admitting: Podiatry

## 2015-02-18 ENCOUNTER — Telehealth: Payer: Self-pay | Admitting: *Deleted

## 2015-02-18 ENCOUNTER — Encounter: Payer: Self-pay | Admitting: Podiatry

## 2015-02-18 ENCOUNTER — Encounter (HOSPITAL_COMMUNITY): Payer: Self-pay | Admitting: Emergency Medicine

## 2015-02-18 ENCOUNTER — Inpatient Hospital Stay (HOSPITAL_COMMUNITY)
Admission: EM | Admit: 2015-02-18 | Discharge: 2015-02-22 | DRG: 630 | Disposition: A | Discharge status: Home / Self Care | Payer: Medicare Other | Attending: Internal Medicine | Admitting: Internal Medicine

## 2015-02-18 VITALS — BP 172/86 | HR 69 | Resp 12

## 2015-02-18 DIAGNOSIS — E785 Hyperlipidemia, unspecified: Secondary | ICD-10-CM | POA: Diagnosis not present

## 2015-02-18 DIAGNOSIS — Z87891 Personal history of nicotine dependence: Secondary | ICD-10-CM

## 2015-02-18 DIAGNOSIS — I10 Essential (primary) hypertension: Secondary | ICD-10-CM | POA: Diagnosis not present

## 2015-02-18 DIAGNOSIS — J449 Chronic obstructive pulmonary disease, unspecified: Secondary | ICD-10-CM

## 2015-02-18 DIAGNOSIS — E1042 Type 1 diabetes mellitus with diabetic polyneuropathy: Secondary | ICD-10-CM | POA: Diagnosis not present

## 2015-02-18 DIAGNOSIS — G609 Hereditary and idiopathic neuropathy, unspecified: Secondary | ICD-10-CM | POA: Diagnosis not present

## 2015-02-18 DIAGNOSIS — E1040 Type 1 diabetes mellitus with diabetic neuropathy, unspecified: Secondary | ICD-10-CM | POA: Diagnosis not present

## 2015-02-18 DIAGNOSIS — K580 Irritable bowel syndrome with diarrhea: Secondary | ICD-10-CM | POA: Diagnosis present

## 2015-02-18 DIAGNOSIS — E1043 Type 1 diabetes mellitus with diabetic autonomic (poly)neuropathy: Secondary | ICD-10-CM | POA: Diagnosis present

## 2015-02-18 DIAGNOSIS — M79674 Pain in right toe(s): Secondary | ICD-10-CM | POA: Diagnosis present

## 2015-02-18 DIAGNOSIS — I129 Hypertensive chronic kidney disease with stage 1 through stage 4 chronic kidney disease, or unspecified chronic kidney disease: Secondary | ICD-10-CM | POA: Diagnosis present

## 2015-02-18 DIAGNOSIS — E1051 Type 1 diabetes mellitus with diabetic peripheral angiopathy without gangrene: Secondary | ICD-10-CM | POA: Diagnosis not present

## 2015-02-18 DIAGNOSIS — F909 Attention-deficit hyperactivity disorder, unspecified type: Secondary | ICD-10-CM | POA: Diagnosis not present

## 2015-02-18 DIAGNOSIS — L03032 Cellulitis of left toe: Secondary | ICD-10-CM

## 2015-02-18 DIAGNOSIS — I70245 Atherosclerosis of native arteries of left leg with ulceration of other part of foot: Secondary | ICD-10-CM | POA: Diagnosis not present

## 2015-02-18 DIAGNOSIS — L97519 Non-pressure chronic ulcer of other part of right foot with unspecified severity: Secondary | ICD-10-CM | POA: Diagnosis present

## 2015-02-18 DIAGNOSIS — L89891 Pressure ulcer of other site, stage 1: Secondary | ICD-10-CM

## 2015-02-18 DIAGNOSIS — E039 Hypothyroidism, unspecified: Secondary | ICD-10-CM | POA: Diagnosis present

## 2015-02-18 DIAGNOSIS — E10329 Type 1 diabetes mellitus with mild nonproliferative diabetic retinopathy without macular edema: Secondary | ICD-10-CM | POA: Diagnosis present

## 2015-02-18 DIAGNOSIS — E1022 Type 1 diabetes mellitus with diabetic chronic kidney disease: Secondary | ICD-10-CM | POA: Diagnosis present

## 2015-02-18 DIAGNOSIS — Z7982 Long term (current) use of aspirin: Secondary | ICD-10-CM

## 2015-02-18 DIAGNOSIS — N189 Chronic kidney disease, unspecified: Secondary | ICD-10-CM | POA: Diagnosis present

## 2015-02-18 DIAGNOSIS — I739 Peripheral vascular disease, unspecified: Secondary | ICD-10-CM | POA: Diagnosis not present

## 2015-02-18 DIAGNOSIS — I252 Old myocardial infarction: Secondary | ICD-10-CM | POA: Diagnosis not present

## 2015-02-18 DIAGNOSIS — F419 Anxiety disorder, unspecified: Secondary | ICD-10-CM | POA: Diagnosis present

## 2015-02-18 DIAGNOSIS — Z794 Long term (current) use of insulin: Secondary | ICD-10-CM

## 2015-02-18 DIAGNOSIS — D649 Anemia, unspecified: Secondary | ICD-10-CM | POA: Diagnosis present

## 2015-02-18 DIAGNOSIS — I2510 Atherosclerotic heart disease of native coronary artery without angina pectoris: Secondary | ICD-10-CM | POA: Diagnosis not present

## 2015-02-18 DIAGNOSIS — E10621 Type 1 diabetes mellitus with foot ulcer: Principal | ICD-10-CM | POA: Diagnosis present

## 2015-02-18 DIAGNOSIS — E291 Testicular hypofunction: Secondary | ICD-10-CM | POA: Diagnosis not present

## 2015-02-18 DIAGNOSIS — J438 Other emphysema: Secondary | ICD-10-CM | POA: Diagnosis not present

## 2015-02-18 DIAGNOSIS — Z7902 Long term (current) use of antithrombotics/antiplatelets: Secondary | ICD-10-CM | POA: Diagnosis not present

## 2015-02-18 DIAGNOSIS — L0390 Cellulitis, unspecified: Secondary | ICD-10-CM | POA: Diagnosis present

## 2015-02-18 DIAGNOSIS — I70244 Atherosclerosis of native arteries of left leg with ulceration of heel and midfoot: Secondary | ICD-10-CM | POA: Diagnosis not present

## 2015-02-18 DIAGNOSIS — L089 Local infection of the skin and subcutaneous tissue, unspecified: Secondary | ICD-10-CM

## 2015-02-18 DIAGNOSIS — L97522 Non-pressure chronic ulcer of other part of left foot with fat layer exposed: Secondary | ICD-10-CM

## 2015-02-18 DIAGNOSIS — E11628 Type 2 diabetes mellitus with other skin complications: Secondary | ICD-10-CM

## 2015-02-18 DIAGNOSIS — L03039 Cellulitis of unspecified toe: Secondary | ICD-10-CM | POA: Diagnosis present

## 2015-02-18 DIAGNOSIS — L97509 Non-pressure chronic ulcer of other part of unspecified foot with unspecified severity: Secondary | ICD-10-CM

## 2015-02-18 DIAGNOSIS — E11621 Type 2 diabetes mellitus with foot ulcer: Secondary | ICD-10-CM

## 2015-02-18 DIAGNOSIS — M7989 Other specified soft tissue disorders: Secondary | ICD-10-CM

## 2015-02-18 LAB — CBC WITH DIFFERENTIAL/PLATELET
Basophils Absolute: 0 10*3/uL (ref 0.0–0.1)
Basophils Relative: 0 % (ref 0–1)
EOS PCT: 2 % (ref 0–5)
Eosinophils Absolute: 0.2 10*3/uL (ref 0.0–0.7)
HCT: 31.3 % — ABNORMAL LOW (ref 39.0–52.0)
HEMOGLOBIN: 10.5 g/dL — AB (ref 13.0–17.0)
Lymphocytes Relative: 10 % — ABNORMAL LOW (ref 12–46)
Lymphs Abs: 0.9 10*3/uL (ref 0.7–4.0)
MCH: 31.5 pg (ref 26.0–34.0)
MCHC: 33.5 g/dL (ref 30.0–36.0)
MCV: 94 fL (ref 78.0–100.0)
MONO ABS: 1 10*3/uL (ref 0.1–1.0)
Monocytes Relative: 11 % (ref 3–12)
Neutro Abs: 6.9 10*3/uL (ref 1.7–7.7)
Neutrophils Relative %: 77 % (ref 43–77)
Platelets: 262 10*3/uL (ref 150–400)
RBC: 3.33 MIL/uL — ABNORMAL LOW (ref 4.22–5.81)
RDW: 13.8 % (ref 11.5–15.5)
WBC: 8.9 10*3/uL (ref 4.0–10.5)

## 2015-02-18 LAB — GLUCOSE, CAPILLARY: GLUCOSE-CAPILLARY: 243 mg/dL — AB (ref 65–99)

## 2015-02-18 LAB — BASIC METABOLIC PANEL
Anion gap: 8 (ref 5–15)
BUN: 24 mg/dL — ABNORMAL HIGH (ref 6–20)
CO2: 27 mmol/L (ref 22–32)
Calcium: 8.8 mg/dL — ABNORMAL LOW (ref 8.9–10.3)
Chloride: 102 mmol/L (ref 101–111)
Creatinine, Ser: 1.35 mg/dL — ABNORMAL HIGH (ref 0.61–1.24)
GFR calc Af Amer: 59 mL/min — ABNORMAL LOW (ref >60)
GFR, EST NON AFRICAN AMERICAN: 51 mL/min — AB (ref >60)
Glucose, Bld: 264 mg/dL — ABNORMAL HIGH (ref 65–99)
Potassium: 5.3 mmol/L — ABNORMAL HIGH (ref 3.5–5.1)
Sodium: 137 mmol/L (ref 135–145)

## 2015-02-18 LAB — COMPREHENSIVE METABOLIC PANEL
ALT: 22 U/L (ref 17–63)
AST: 29 U/L (ref 15–41)
Albumin: 3.3 g/dL — ABNORMAL LOW (ref 3.5–5.0)
Alkaline Phosphatase: 80 U/L (ref 38–126)
Anion gap: 8 (ref 5–15)
BUN: 24 mg/dL — ABNORMAL HIGH (ref 6–20)
CALCIUM: 9 mg/dL (ref 8.9–10.3)
CHLORIDE: 101 mmol/L (ref 101–111)
CO2: 29 mmol/L (ref 22–32)
Creatinine, Ser: 1.43 mg/dL — ABNORMAL HIGH (ref 0.61–1.24)
GFR calc Af Amer: 55 mL/min — ABNORMAL LOW (ref >60)
GFR, EST NON AFRICAN AMERICAN: 47 mL/min — AB (ref >60)
GLUCOSE: 257 mg/dL — AB (ref 65–99)
Potassium: 5.2 mmol/L — ABNORMAL HIGH (ref 3.5–5.1)
SODIUM: 138 mmol/L (ref 135–145)
Total Bilirubin: 0.5 mg/dL (ref 0.3–1.2)
Total Protein: 6.6 g/dL (ref 6.5–8.1)

## 2015-02-18 LAB — C-REACTIVE PROTEIN

## 2015-02-18 LAB — SEDIMENTATION RATE: Sed Rate: 50 mm/hr — ABNORMAL HIGH (ref 0–16)

## 2015-02-18 MED ORDER — ONDANSETRON HCL 4 MG/2ML IJ SOLN
4.0000 mg | Freq: Four times a day (QID) | INTRAMUSCULAR | Status: DC | PRN
  Administered 2015-02-19: 4 mg via INTRAVENOUS
  Filled 2015-02-18: qty 2

## 2015-02-18 MED ORDER — THIAMINE HCL 100 MG/ML IJ SOLN
100.0000 mg | Freq: Every day | INTRAMUSCULAR | Status: DC
  Filled 2015-02-18 (×4): qty 1

## 2015-02-18 MED ORDER — INSULIN ASPART 100 UNIT/ML ~~LOC~~ SOLN
0.0000 [IU] | Freq: Four times a day (QID) | SUBCUTANEOUS | Status: DC
  Administered 2015-02-19: 3 [IU] via SUBCUTANEOUS

## 2015-02-18 MED ORDER — HEPARIN SODIUM (PORCINE) 5000 UNIT/ML IJ SOLN
5000.0000 [IU] | Freq: Three times a day (TID) | INTRAMUSCULAR | Status: DC
  Administered 2015-02-18 – 2015-02-21 (×9): 5000 [IU] via SUBCUTANEOUS
  Filled 2015-02-18 (×10): qty 1

## 2015-02-18 MED ORDER — ADULT MULTIVITAMIN W/MINERALS CH
1.0000 | ORAL_TABLET | Freq: Every day | ORAL | Status: DC
  Administered 2015-02-19 – 2015-02-21 (×3): 1 via ORAL
  Filled 2015-02-18 (×5): qty 1

## 2015-02-18 MED ORDER — ACETAMINOPHEN 325 MG PO TABS
650.0000 mg | ORAL_TABLET | Freq: Four times a day (QID) | ORAL | Status: DC | PRN
  Filled 2015-02-18: qty 2

## 2015-02-18 MED ORDER — SODIUM CHLORIDE 0.9 % IV SOLN
INTRAVENOUS | Status: DC
  Administered 2015-02-18: 23:00:00 via INTRAVENOUS

## 2015-02-18 MED ORDER — LORAZEPAM 2 MG/ML IJ SOLN
1.0000 mg | Freq: Four times a day (QID) | INTRAMUSCULAR | Status: AC | PRN
  Administered 2015-02-19: 1 mg via INTRAVENOUS
  Filled 2015-02-18: qty 1

## 2015-02-18 MED ORDER — ONDANSETRON HCL 4 MG PO TABS: 4.0000 mg | ORAL_TABLET | Freq: Four times a day (QID) | ORAL | Status: DC | PRN

## 2015-02-18 MED ORDER — SODIUM CHLORIDE 0.9 % IJ SOLN
3.0000 mL | Freq: Two times a day (BID) | INTRAMUSCULAR | Status: DC
  Administered 2015-02-19: 22:00:00 3 mL via INTRAVENOUS

## 2015-02-18 MED ORDER — VITAMIN B-1 100 MG PO TABS
100.0000 mg | ORAL_TABLET | Freq: Every day | ORAL | Status: DC
  Administered 2015-02-19 – 2015-02-21 (×3): 100 mg via ORAL
  Filled 2015-02-18 (×4): qty 1

## 2015-02-18 MED ORDER — FOLIC ACID 1 MG PO TABS
1.0000 mg | ORAL_TABLET | Freq: Every day | ORAL | Status: DC
  Administered 2015-02-19 – 2015-02-21 (×3): 1 mg via ORAL
  Filled 2015-02-18 (×4): qty 1

## 2015-02-18 MED ORDER — PIPERACILLIN-TAZOBACTAM 3.375 G IVPB
3.3750 g | Freq: Three times a day (TID) | INTRAVENOUS | Status: DC
  Administered 2015-02-18 – 2015-02-22 (×10): 3.375 g via INTRAVENOUS
  Filled 2015-02-18 (×14): qty 50

## 2015-02-18 MED ORDER — VANCOMYCIN HCL IN DEXTROSE 750-5 MG/150ML-% IV SOLN
750.0000 mg | INTRAVENOUS | Status: DC
  Administered 2015-02-19 – 2015-02-21 (×3): 750 mg via INTRAVENOUS
  Filled 2015-02-18 (×5): qty 150

## 2015-02-18 MED ORDER — INSULIN GLARGINE 100 UNIT/ML ~~LOC~~ SOLN
11.0000 [IU] | Freq: Every day | SUBCUTANEOUS | Status: DC
  Administered 2015-02-18 – 2015-02-21 (×4): 11 [IU] via SUBCUTANEOUS
  Filled 2015-02-18 (×6): qty 0.11

## 2015-02-18 MED ORDER — LORAZEPAM 1 MG PO TABS
1.0000 mg | ORAL_TABLET | Freq: Four times a day (QID) | ORAL | Status: AC | PRN
  Administered 2015-02-21: 1 mg via ORAL
  Filled 2015-02-18: qty 1

## 2015-02-18 MED ORDER — VANCOMYCIN HCL IN DEXTROSE 1-5 GM/200ML-% IV SOLN
1000.0000 mg | Freq: Once | INTRAVENOUS | Status: AC
  Administered 2015-02-18: 1000 mg via INTRAVENOUS
  Filled 2015-02-18: qty 200

## 2015-02-18 MED ORDER — ACETAMINOPHEN 650 MG RE SUPP: 650.0000 mg | Freq: Four times a day (QID) | RECTAL | Status: DC | PRN

## 2015-02-18 NOTE — Progress Notes (Signed)
Patient ID: Benjamin Hodge, male   DOB: [birth year], 74 y.o.   MRN: [id number]  Subjective: 74 year old male presents the office today for follow up evaluation of left third toe ulceration as well as blister to the left fifth digit. He states that since last appointment he is been applying Silvadene to the wound on the third digit. He did call the call service over the weekend stating his left fifth toe become more red. He has been on clindamycin and ciprofloxacin for the left couple weeks. He has started with a surgical shoe more often. He denies any systemic complaints as fevers, chills, nausea, vomiting. Denies any acute changes otherwise since last appointment.   Objective: AAO x 3, NAD DP/PT pulses decreased bilaterally, CRT delayed to the third and fifth digit left foot. Decreased protective sensation with Dorann Ou monofilament On the left third digit on the plantar aspect of the PIPJ there is a linear ulceration in the sulcus of the PIPJ which probes to bone. There is erythema to the digit as well as edema which persists. Although the erythema is not as significant as what it was upon initial evaluation 2 weeks ago but still remains. Overlying the left fifth digit there is a granular ulceration encompassing the distal lateral portion of the toe with a granular wound base. There is no probing to bone, tunneling, undermining. There is erythema to this digit. There is erythema extending to the dorsal aspect of the foot along the metatarsal level which is worsened from last Friday. There is no areas of fluctuance or crepitus. No malodor. No other open lesions or pre-ulcer lesions identified No pain with calf compression, swelling, warmth, erythema.  Assessment: 74 year old male with left third and fifth digit ulceration with worsening cellulitis; has been on clindamycin and ciprofloxacin for 2 weeks  Plan: -Treatment options discussed including all alternatives, risks, and  complications -At this time as the synovitis appears to be worsening despite PO antibiotics I recommended the patient in the emergency room further evaluation. He verbally understood this. -Continue PVD the preliminary report on the arterial duplex his occlusion on the left popliteal 70-99% reduction. I've contacted Dr. Gery Pray for follow-up evaluation if he does not get admitted to the hospital  -Silvadene was applied followed by a DSD -Patient will go directly to the ED.

## 2015-02-18 NOTE — Progress Notes (Signed)
Pt arrived to 5W12. Alert and orietned x 4. VS stable with exception of elevated BP. CBG 243. MD notified, awaiting for pharmacist to reconcile his home medications. No pressure ulcer noted on sacral area. Redness and swelling noted on left 3th and 5th toe. Decreased sensation on left foot. Will continue to monitor.

## 2015-02-18 NOTE — Telephone Encounter (Signed)
Patient needs to see Benjamin Hodge per Benjamin Hodge for ischemic foot.

## 2015-02-18 NOTE — Progress Notes (Signed)
Received report on pt from [place]. RN is to attempt IV access and give Vancomycin if possible before transfer pt.

## 2015-02-18 NOTE — H&P (Signed)
[facility] History and Physical  Patient: Benjamin Hodge  MRN: [id number]  DOB: October 30, 1940  DOS: the patient was seen and examined on 02/18/2015 PCP: Estill Dooms, MD  Referring: [facility] Chief Complaint: Nonhealing ulcer on the left toe  HPI: Benjamin Hodge is a 74 y.o. male with Past medical history of coronary artery disease, peripheral vascular disease, anxiety, COPD, alcohol use, hypertension, chronic kidney disease, diabetic retinopathy, diabetic neuropathy hypothyroidism, erectile dysfunction. The patient is presenting with complaints of nonhealing ulcer of the left foot. Patient has history of peripheral vascular disease as well as recurrent left foot swelling. He had decreased pulse as early as February 2016. He was earlier seen at podiatry on May 29, and was started on doxycycline with wound dressing for third toe redness and swelling. Later on 3 days later he was reevaluated and due to worsening redness, antibiotics were changed to clindamycin and Ciprofloxacin.  Due to decreased pulsation chronically he was referred for arterial Doppler on 02/13/2015, which was showing "right mid SFA demonstrated a 50-69% diameter reduction and the left proximal SFA and left mid popliteal artery demonstrated a 70-99% diameter reduction." On reevaluation as per podiatry note his third toe redness improved, but he developed another fifth toe ulcer, after a nail avulsion. He also developed redness involving the dorsal foot and therefore was sent to ER for IV antibiotics as failure to oral therapy. Collier Salina the time of my evaluation mentions she has chronic neuropathy in his legs, he also has chronic diarrhea. He denies any abdominal pain, chest pain, shortness of breath, fever, chills, nausea, vomiting, burning urination. He complains of generalized fatigue and weakness. Patient mentions he is doing all his medications on his own although does not remember the name and dose of  them.  The patient is coming from home And at his baseline independent for most of his ADL.  Review of Systems: as mentioned in the history of present illness.  A comprehensive review of the other systems is negative.  Past Medical History  Diagnosis Date  . CAD (coronary artery disease)     LAD 40-50% stenosis, first diagonal small 90% stenosis, circumflex 70% stenosis, OM 80% stenosis, right coronary artery 80-90% stenosis.  . Right upper lobe pneumonia 11/28/2008  . Anxiety 07/18/2008  . Irritable bowel syndrome 04/27/2008  . COPD (chronic obstructive pulmonary disease) 02/27/2008  . B12 deficiency 02/16/2008    in 5/09: B12 262, MMA 1040  . Chronic diarrhea 01/01/2008    s/p EGD 10/06: H pylori + gastritis, duodenal biopsy normal (Dr. Benson Norway); s/p colonoscopy 10/06: hemorrhoids (Dr. Benson Norway); evaluated by Dr. Scarlette Shorts  in 8/09: tissue transglutaminase AB negative, VIP normal, stool fat content normal, urine 5-HIAA normal; normal GES 11/09 (done for "fluctuating sugars")  . 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 Orthostatic hypotension 02/09/2007  . Hypertension 02/09/2007  . Spinal stenosis in cervical region 05/03    MRI  . Erectile dysfunction 09/27/2006    s/p penile implant  . Anemia, iron deficiency 09/27/2006    neg colonoscopy 2006 - Dr. Benson Norway; ferritin 152; hgb  15.7  on 07/07  . Hypothyroidism 09/27/2006    TSH 2.639  07/07  . Hypersomnia 09/27/2006    evaluated by Dr. Baird Lyons (5/08) and Dr. Brett Fairy; PSG 8/09: chronic delayed sleep phase syndrome (patient sleeps during the day and is awake at night), nocturnal myoclonus (eval for RLS and IDA suggested). Pt advised to change sleeping behavior  . Diabetes mellitus type  II 09/07/1984    poorly controlled, complicated by peripheral neuropathy, microalbuminuria, mild non proliferative retinopathy, s/p DKA 7/05, on insulin pump x 4/09, started a pump vacation on 11/19/2008  . Hyperlipidemia   .  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 Hemorrhoids   . Chronic kidney disease   . Unspecified hereditary and idiopathic peripheral neuropathy   . Other chronic pain   . Other malaise and fatigue   . Depression   . Hypertrophy of prostate without urinary obstruction and other lower urinary tract symptoms (LUTS)   . Lumbago   . Heart attack 03/2014    mild   Past Surgical History  Procedure Laterality Date  . Penile prosthesis implant    . Tonsillectomy    . Colonoscopy  06/25/2005    Dr. Carol Ada  . Left heart catheterization with coronary angiogram [name] 03/13/2014    Procedure: LEFT HEART CATHETERIZATION WITH CORONARY ANGIOGRAM;  Surgeon: Peter M Martinique, MD;  Location: [facility] CATH LAB;  Service: Cardiovascular;  Laterality: [name];   Social History:  reports that he quit smoking about 10 years ago. His smoking use included Cigarettes. He has never used smokeless tobacco. He reports that he drinks alcohol. He reports that he does not use illicit drugs.  Allergies  Allergen Reactions  . Lipitor [Atorvastatin] Other (See Comments)    Caused pain all over body.  . Ace Inhibitors Other (See Comments)    dizzy  . Angiotensin Receptor Blockers Other (See Comments)    unknown  . Bee Venom Swelling  . Cymbalta [Duloxetine Hcl] Other (See Comments)    dizzy    Family History  Problem Relation Age of Onset  . Bone cancer Mother   . Breast cancer Mother   . Cancer Mother   . Lung cancer Father   . Cancer Father   . Breast cancer Sister   . Cancer Sister   . Lung cancer Brother   . Cancer Sister     type unknown    Prior to Admission medications   Medication Sig Start Date End Date Taking? Authorizing Provider  amLODipine (NORVASC) 2.5 MG tablet Take 2.5 mg by mouth daily. 12/31/14  Yes Historical Provider, MD  aspirin 81 MG tablet Take 81 mg by mouth daily.    Yes Historical Provider, MD  ciprofloxacin (CIPRO) 500 MG tablet Take 1 tablet (500 mg total) by mouth 2 (two) times daily. 02/06/15   Yes Trula Slade, DPM  clindamycin (CLEOCIN) 300 MG capsule Take 1 capsule (300 mg total) by mouth 3 (three) times daily. 02/06/15  Yes Trula Slade, DPM  clopidogrel (PLAVIX) 75 MG tablet TAKE 1 TABLET BY MOUTH DAILY WITH BREAKFAST 02/01/15  Yes Estill Dooms, MD  Continuous Glucose Monitor (DEXCOM G4 PLATINUM RECEIVER) KIT USE AS DIRECTED 07/10/14  Yes Elayne Snare, MD  diphenoxylate-atropine (LOMOTIL) 2.5-0.025 MG per tablet TAKE ONE OR TWO TABLETS BY MOUTH TWICE DAILY AS NEEDED  Patient taking differently: TAKE ONE OR TWO TABLETS BY MOUTH TWICE DAILY AS NEEDED FOR LOOSE STOOLS 07/04/14  Yes Mahima Pandey, MD  escitalopram (LEXAPRO) 20 MG tablet TAKE ONE TABLET BY MOUTH ONE TIME DAILY 02/13/15  Yes Gildardo Cranker, DO  gabapentin (NEURONTIN) 600 MG tablet TAKE ONE TABLET BY MOUTH THREE TIMES DAILY  Patient taking differently: Take 1/2 tablet daily 08/15/14  Yes Elayne Snare, MD  HYDROcodone-acetaminophen (NORCO/VICODIN) 5-325 MG per tablet Take one tablet by mouth five times daily as needed for pain 02/15/15  Yes Gildardo Cranker, DO  Insulin Glargine (TOUJEO SOLOSTAR) 300 UNIT/ML SOPN Inject 11 Units into the skin at bedtime.    Yes Historical Provider, MD  insulin lispro (HUMALOG) 100 UNIT/ML KiwkPen Inject 1-6 Units into the skin daily as needed (100-199 = 1 units, 200-299 = 2 units, 300-399 = 3 units, 400-499 = 4 units, 500-599 = 5 units, 600+ = 6 units).  03/16/13  Yes Elayne Snare, MD  levothyroxine (SYNTHROID, LEVOTHROID) 75 MCG tablet TAKE ONE TABLET BY MOUTH ONE TIME DAILY  12/06/14  Yes Estill Dooms, MD  methylphenidate (RITALIN) 20 MG tablet Take 1 tablet (20 mg total) by mouth 2 (two) times daily. Patient taking differently: Take 20 mg by mouth daily.  02/05/15  Yes Carmen Dohmeier, MD  metoCLOPramide (REGLAN) 5 MG tablet USE ONCE A DAY AFTER LUCHTIME MEAL 09/19/14  Yes Asencion Partridge Dohmeier, MD  metoprolol tartrate (LOPRESSOR) 25 MG tablet TAKE ONE TABLET BY MOUTH TWICE DAILY  12/12/14  Yes Estill Dooms, MD  ONE TOUCH ULTRA TEST test strip TEST 8 TIMES PER DAY FOR 30 DAYS 04/26/14  Yes Elayne Snare, MD  pantoprazole (PROTONIX) 40 MG tablet Take one tablet by mouth once daily for stomach Patient taking differently: Take 40 mg by mouth daily.  10/18/14  Yes Estill Dooms, MD  pravastatin (PRAVACHOL) 20 MG tablet Take 1 tablet (20 mg total) by mouth every evening. 07/06/14  Yes Minus Breeding, MD  pregabalin (LYRICA) 50 MG capsule 1-2 capsules at bed to help neuropathic pain Patient taking differently: Take 100 mg by mouth at bedtime.  11/23/14  Yes Gildardo Cranker, DO  silver sulfADIAZINE (SILVADENE) 1 % cream Apply 1 application topically daily. 02/15/15  Yes Trula Slade, DPM  vitamin B-12 (CYANOCOBALAMIN) 1000 MCG tablet Take 1,000 mcg by mouth daily.   Yes Historical Provider, MD  amLODipine (NORVASC) 5 MG tablet Take 1 tablet (5 mg total) by mouth daily. Patient not taking: Reported on 02/18/2015 09/14/14   Minus Breeding, MD  doxycycline (VIBRA-TABS) 100 MG tablet Take 1 tablet (100 mg total) by mouth 2 (two) times daily. Patient not taking: Reported on 02/18/2015 01/30/15   Trula Slade, DPM  testosterone cypionate (DEPOTESTOTERONE CYPIONATE) 200 MG/ML injection Inject 200 mg every 14 days Patient not taking: Reported on 02/18/2015 01/02/15   Estill Dooms, MD    Physical Exam: Filed Vitals:   02/18/15 1941 02/18/15 2100 02/18/15 2115 02/18/15 2153  BP: 171/65 164/66 174/76 154/88  Pulse: 62 63 60 66  Temp:    97.8 F (36.6 C)  TempSrc:    Oral  Resp: 18   18  Height:    _0  (1.676 m)  Weight:    56.2 kg (123 lb 14.4 oz)  SpO2: 99% 100% 100% 100%    General: Alert, Awake and Oriented to Time, Place and Person. Appear in mild distress Eyes: PERRL ENT: Oral Mucosa clear moist. Neck: no JVD Cardiovascular: S1 and S2 Present, no Murmur, Peripheral Pulses Present Respiratory: Bilateral Air entry equal and Decreased,  Clear to Auscultation, no Crackles, no wheezes Abdomen:  Bowel Sound present, Soft and non tender Skin: no Rash Extremities: left third toe redness and swelling, open ulcer on bottom, probing to Bone per podiatry Left fifth toe avulsed nail, no oozing no bleeding Faintly palpable dorsalis pedis in the left.  No Pedal edema, no calf tenderness Neurologic: Decreased sensations to light touch bilaterally left more than right.   Labs on Admission:  CBC:  Recent Labs Lab 02/18/15 1747  WBC 8.9  NEUTROABS 6.9  HGB 10.5*  HCT 31.3*  MCV 94.0  PLT 262    CMP     Component Value Date/Time   NA 138 02/18/2015 1748   NA 137 01/01/2015 1421   NA 143 11/29/2013 1507   K 5.2* 02/18/2015 1748   K 5.0 11/29/2013 1507   CL 101 02/18/2015 1748   CL 108* 11/07/2012 1507   CO2 29 02/18/2015 1748   CO2 26 11/29/2013 1507   GLUCOSE 257* 02/18/2015 1748   GLUCOSE 368* 01/01/2015 1421   GLUCOSE 156* 11/29/2013 1507   GLUCOSE 256* 11/07/2012 1507   BUN 24* 02/18/2015 1748   BUN 20 01/01/2015 1421   BUN 20.9 11/29/2013 1507   CREATININE 1.43* 02/18/2015 1748   CREATININE 1.1 11/29/2013 1507   CALCIUM 9.0 02/18/2015 1748   CALCIUM 9.3 11/29/2013 1507   PROT 6.6 02/18/2015 1748   PROT 6.0 01/01/2015 1421   PROT 6.6 11/29/2013 1507   ALBUMIN 3.3* 02/18/2015 1748   ALBUMIN 3.9 11/29/2013 1507   AST 29 02/18/2015 1748   AST 22 11/29/2013 1507   ALT 22 02/18/2015 1748   ALT 20 11/29/2013 1507   ALKPHOS 80 02/18/2015 1748   ALKPHOS 65 11/29/2013 1507   BILITOT 0.5 02/18/2015 1748   BILITOT <0.2 01/01/2015 1421   BILITOT 0.46 11/29/2013 1507   GFRNONAA 47* 02/18/2015 1748   GFRAA 55* 02/18/2015 1748   Assessment/Plan Principal Problem:   Cellulitis, toe Active Problems:   Hypothyroidism   Essential hypertension   COPD (chronic obstructive pulmonary disease)   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 Cellulitis, toe  Chronic limb ischemia, peripheral vascular disease.  The patient is presenting with complaints of worsening synovitis of the left toe which is not responding to outpatient clindamycin and ciprofloxacin. Patient has history of diabetes and diabetic neuropathy as well as peripheral vascular disease which is limiting patient's healing. ABI one year ago was normal but arterial Doppler was concerning for left popliteal artery stenosis as well as right SFA stenosis, patient's symptoms has been present for neuropathy since long and he has decreased palpable pulses bilaterally present since February thus it appears that Patient has chronically reduced circulation. Discussed with on call vascular surgery who is following up with the patient in the morning. Increasing dose of aspirin as well as continue Plavix. Monitor pedal pulses. Patient remains nothing by mouth after midnight. Choice of antibiotics vancomycin and Zosyn due to poor response to oral antibiotics clindamycin and ciprofloxacin. Check Blood culture ESR CRP CPK.  2.Diabetes mellitus type 1. The patient is using concentrated glargin insulin at home instead of Lantus. Discussed with pharmacy and conversion will be one is to 1. At present we will use tendons of Lantus at night if the patient remains nothing by mouth after midnight and will place him on sliding scale insulin. Recent hemoglobin A1c was 10.1; 2 months ago.  3.Hypothyroidism. Continue Synthroid.  4.Alcohol use. Patient drinks 3-4 shots of tequila on a daily basis. Last drink was last night. Next and monitor closely for withdrawal. Placed on withdrawal prevention protocol.  5.History of hypertension. Patient was recently prescribed amlodipine and Lopressor by PCP although the patient is unsure of taking them. We will use Lopressor 12.5 twice a day.  Advance goals of care discussion: full code   Consults:  I discussed with Dr Oneida Alar from  vascular surgery.  DVT Prophylaxis: subcutaneous Heparin Nutrition: npo after midnight  Disposition: Admitted as inpatient, telemetry unit.  Author: Berle Mull, MD [facility] Pager: [phone] 02/18/2015  If 7PM-7AM, please contact night-coverage [email or web address] Password [email or web address]

## 2015-02-18 NOTE — ED Notes (Signed)
Pt sent here from pcp. Pt being tx for cellulitis and wounds to L toes for past 2 weeks. pcp feels pt needs iv antibiotics at this time.

## 2015-02-18 NOTE — Patient Instructions (Signed)
I have seen Benjamin Hodge today for wounds on his left 3rd and 5th toe. He has had a wound on the 3rd toe along the plantar PIPJ for a couple of weeks. I put him on antibiotics and the cellulitis resolved some. I saw him on Friday and the redness was somewhat better to the 3rd toe. At that time he had a partially de-roofed bulla and the toenail was mostly off. He came back to the office today because of worsening wounds. There is increased redness on the dorsal foot, edema, and increase in warmth. Because of this, I have referred him to the emergency department. I feel he needs IV antibiotics at this time. He was scheduled to see Dr. Allyson Sabal next week due to circulation.

## 2015-02-18 NOTE — ED Provider Notes (Signed)
CSN: [id number]     Arrival date & time 02/18/15  1732 History   First MD Initiated Contact with Patient 02/18/15 1927     Chief Complaint  Patient presents with  . Wound Infection     (Consider location/radiation/quality/duration/timing/severity/associated sxs/prior Treatment) The history is provided by the patient.   74 year old male comes in because his podiatrist was concerned about infection in his left foot. He has been treated for about the last 3 weeks for infections in his left third and fifth toes. He has received local care with Silvadene cream and oral antibody is with ciprofloxacin and clindamycin but the redness is apparently getting worse. He was sent here by his podiatrist would evaluated in the day and felt that he was a failure of outpatient management and felt he needed intravenous antibody except. He has diabetic neuropathy and has not had any pain in his foot. He denies fever or chills.  Past Medical History  Diagnosis Date  . CAD (coronary artery disease)     LAD 40-50% stenosis, first diagonal small 90% stenosis, circumflex 70% stenosis, OM 80% stenosis, right coronary artery 80-90% stenosis.  . Right upper lobe pneumonia 11/28/2008  . Anxiety 07/18/2008  . Irritable bowel syndrome 04/27/2008  . COPD (chronic obstructive pulmonary disease) 02/27/2008  . B12 deficiency 02/16/2008    in 5/09: B12 262, MMA 1040  . Chronic diarrhea 01/01/2008    s/p EGD 10/06: H pylori + gastritis, duodenal biopsy normal (Dr. Benson Norway); s/p colonoscopy 10/06: hemorrhoids (Dr. Benson Norway); evaluated by Dr. Scarlette Shorts  in 8/09: tissue transglutaminase AB negative, VIP normal, stool fat content normal, urine 5-HIAA normal; normal GES 11/09 (done for "fluctuating sugars")  . 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 Orthostatic hypotension 02/09/2007  . Hypertension 02/09/2007  . Spinal stenosis in cervical region 05/03    MRI  . Erectile dysfunction 09/27/2006    s/p penile  implant  . Anemia, iron deficiency 09/27/2006    neg colonoscopy 2006 - Dr. Benson Norway; ferritin 152; hgb  15.7  on 07/07  . Hypothyroidism 09/27/2006    TSH 2.639  07/07  . Hypersomnia 09/27/2006    evaluated by Dr. Baird Lyons (5/08) and Dr. Brett Fairy; PSG 8/09: chronic delayed sleep phase syndrome (patient sleeps during the day and is awake at night), nocturnal myoclonus (eval for RLS and IDA suggested). Pt advised to change sleeping behavior  . Diabetes mellitus type II 09/07/1984    poorly controlled, complicated by peripheral neuropathy, microalbuminuria, mild non proliferative retinopathy, s/p DKA 7/05, on insulin pump x 4/09, started a pump vacation on 11/19/2008  . Hyperlipidemia   . 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 Hemorrhoids   . Chronic kidney disease   . Unspecified hereditary and idiopathic peripheral neuropathy   . Other chronic pain   . Other malaise and fatigue   . Depression   . Hypertrophy of prostate without urinary obstruction and other lower urinary tract symptoms (LUTS)   . Lumbago   . Heart attack 03/2014    mild   Past Surgical History  Procedure Laterality Date  . Penile prosthesis implant    . Tonsillectomy    . Colonoscopy  06/25/2005    Dr. Carol Ada  . Left heart catheterization with coronary angiogram [name] 03/13/2014    Procedure: LEFT HEART CATHETERIZATION WITH CORONARY ANGIOGRAM;  Surgeon: Peter M Martinique, MD;  Location: [facility] CATH LAB;  Service: Cardiovascular;  Laterality: [name];   Family History  Problem Relation Age of Onset  . Bone  cancer Mother   . Breast cancer Mother   . Cancer Mother   . Lung cancer Father   . Cancer Father   . Breast cancer Sister   . Cancer Sister   . Lung cancer Brother   . Cancer Sister     type unknown   History  Substance Use Topics  . Smoking status: Former Smoker    Types: Cigarettes    Quit date: 09/28/2004  . Smokeless tobacco: Never Used  . Alcohol Use: 0.0 oz/week    0 Standard drinks or equivalent per week      Comment: occ/social (moderate)    Review of Systems  All other systems reviewed and are negative.     Allergies  Lipitor; Ace inhibitors; Angiotensin receptor blockers; Bee venom; and Cymbalta  Home Medications   Prior to Admission medications   Medication Sig Start Date End Date Taking? Authorizing Provider  amLODipine (NORVASC) 5 MG tablet Take 1 tablet (5 mg total) by mouth daily. 09/14/14   Minus Breeding, MD  aspirin 81 MG tablet Take 81 mg by mouth daily.     Historical Provider, MD  ciprofloxacin (CIPRO) 500 MG tablet Take 1 tablet (500 mg total) by mouth 2 (two) times daily. 02/06/15   Trula Slade, DPM  clindamycin (CLEOCIN) 300 MG capsule Take 1 capsule (300 mg total) by mouth 3 (three) times daily. 02/06/15   Trula Slade, DPM  clopidogrel (PLAVIX) 75 MG tablet TAKE 1 TABLET BY MOUTH DAILY WITH BREAKFAST 02/01/15   Estill Dooms, MD  Continuous Glucose Monitor (DEXCOM G4 PLATINUM RECEIVER) KIT USE AS DIRECTED 07/10/14   Elayne Snare, MD  diphenoxylate-atropine (LOMOTIL) 2.5-0.025 MG per tablet TAKE ONE OR TWO TABLETS BY MOUTH TWICE DAILY AS NEEDED  07/04/14   Blanchie Serve, MD  doxycycline (VIBRA-TABS) 100 MG tablet Take 1 tablet (100 mg total) by mouth 2 (two) times daily. 01/30/15   Trula Slade, DPM  Elastic Bandages & Supports (RIB BELT/MENS/ELASTIC REGULAR) MISC Use as needed for discomfort 11/28/14   Estill Dooms, MD  escitalopram (LEXAPRO) 20 MG tablet TAKE ONE TABLET BY MOUTH ONE TIME DAILY 02/13/15   Gildardo Cranker, DO  gabapentin (NEURONTIN) 600 MG tablet TAKE ONE TABLET BY MOUTH THREE TIMES DAILY  Patient taking differently: Take 1/2 tablet daily 08/15/14   Elayne Snare, MD  HYDROcodone-acetaminophen (NORCO/VICODIN) 5-325 MG per tablet Take one tablet by mouth five times daily as needed for pain 02/15/15   Gildardo Cranker, DO  Insulin Glargine (TOUJEO SOLOSTAR) 300 UNIT/ML SOPN Inject 10 Units into the skin daily.    Historical Provider, MD  insulin lispro (HUMALOG)  100 UNIT/ML KiwkPen Inject 1-6 Units into the skin daily as needed (after meals).  03/16/13   Elayne Snare, MD  Insulin Pen Needle (BD PEN NEEDLE NANO U/F) 32G X 4 MM MISC 1 each by Does not apply route 3 (three) times daily. 05/04/14   Elayne Snare, MD  levothyroxine (SYNTHROID, LEVOTHROID) 75 MCG tablet TAKE ONE TABLET BY MOUTH ONE TIME DAILY  12/06/14   Estill Dooms, MD  methylphenidate (RITALIN) 20 MG tablet Take 1 tablet (20 mg total) by mouth 2 (two) times daily. 02/05/15   Asencion Partridge Dohmeier, MD  metoCLOPramide (REGLAN) 5 MG tablet USE ONCE A DAY AFTER LUCHTIME MEAL 09/19/14   Larey Seat, MD  metoprolol tartrate (LOPRESSOR) 25 MG tablet TAKE ONE TABLET BY MOUTH TWICE DAILY  12/12/14   Estill Dooms, MD  ONE TOUCH ULTRA TEST test  strip TEST 8 TIMES PER DAY FOR 30 DAYS 04/26/14   Elayne Snare, MD  pantoprazole (PROTONIX) 40 MG tablet Take one tablet by mouth once daily for stomach 10/18/14   Estill Dooms, MD  pravastatin (PRAVACHOL) 20 MG tablet Take 1 tablet (20 mg total) by mouth every evening. 07/06/14   Minus Breeding, MD  pregabalin (LYRICA) 50 MG capsule 1-2 capsules at bed to help neuropathic pain 11/23/14   Gildardo Cranker, DO  silver sulfADIAZINE (SILVADENE) 1 % cream Apply 1 application topically daily. 02/15/15   Trula Slade, DPM  testosterone cypionate (DEPOTESTOTERONE CYPIONATE) 200 MG/ML injection Inject 200 mg every 14 days 01/02/15   Estill Dooms, MD  vitamin B-12 (CYANOCOBALAMIN) 1000 MCG tablet Take 1,000 mcg by mouth daily.    Historical Provider, MD   BP 165/81 mmHg  Pulse 59  Temp(Src) 97.6 F (36.4 C) (Oral)  Resp 18  Ht 5' 6"  (1.676 m)  Wt 128 lb 6.4 oz (58.242 kg)  BMI 20.73 kg/m2  SpO2 100% Physical Exam  Nursing note and vitals reviewed.  74 year old male, resting comfortably and in no acute distress. Vital signs are significant for hypertension and borderline cardiac. Oxygen saturation is 100%, which is normal. Head is normocephalic and atraumatic. PERRLA, EOMI.  Oropharynx is clear. Neck is nontender and supple without adenopathy or JVD. Back is nontender and there is no CVA tenderness. Lungs are clear without rales, wheezes, or rhonchi. Chest is nontender. Heart has regular rate and rhythm without murmur. Abdomen is soft, flat, nontender without masses or hepatosplenomegaly and peristalsis is normoactive. Extremities: There is erythema of the left third and fifth toes. The nail of the fifth toe is gone and there has been some desquamation of the distal phalanx. There is no warmth of the left foot compared with the right. Capillary refill is 4 seconds on the left and 2 seconds on the right. Skin is warm and dry without rash. Neurologic: Mental status is normal, cranial nerves are intact, there are no motor or sensory deficits.  ED Course  Procedures (including critical care time) Labs Review Results for orders placed or performed during the hospital encounter of 75/17/00  Basic metabolic panel  Result Value Ref Range   Sodium 137 135 - 145 mmol/L   Potassium 5.3 (H) 3.5 - 5.1 mmol/L   Chloride 102 101 - 111 mmol/L   CO2 27 22 - 32 mmol/L   Glucose, Bld 264 (H) 65 - 99 mg/dL   BUN 24 (H) 6 - 20 mg/dL   Creatinine, Ser 1.35 (H) 0.61 - 1.24 mg/dL   Calcium 8.8 (L) 8.9 - 10.3 mg/dL   GFR calc non Af Amer 51 (L) >60 mL/min   GFR calc Af Amer 59 (L) >60 mL/min   Anion gap 8 5 - 15  CBC with Differential  Result Value Ref Range   WBC 8.9 4.0 - 10.5 K/uL   RBC 3.33 (L) 4.22 - 5.81 MIL/uL   Hemoglobin 10.5 (L) 13.0 - 17.0 g/dL   HCT 31.3 (L) 39.0 - 52.0 %   MCV 94.0 78.0 - 100.0 fL   MCH 31.5 26.0 - 34.0 pg   MCHC 33.5 30.0 - 36.0 g/dL   RDW 13.8 11.5 - 15.5 %   Platelets 262 150 - 400 K/uL   Neutrophils Relative % 77 43 - 77 %   Neutro Abs 6.9 1.7 - 7.7 K/uL   Lymphocytes Relative 10 (L) 12 - 46 %   Lymphs  Abs 0.9 0.7 - 4.0 K/uL   Monocytes Relative 11 3 - 12 %   Monocytes Absolute 1.0 0.1 - 1.0 K/uL   Eosinophils Relative 2 0 - 5 %    Eosinophils Absolute 0.2 0.0 - 0.7 K/uL   Basophils Relative 0 0 - 1 %   Basophils Absolute 0.0 0.0 - 0.1 K/uL  Sedimentation rate  Result Value Ref Range   Sed Rate 50 (H) 0 - 16 mm/hr  C-reactive protein  Result Value Ref Range   CRP <0.5 <1.0 mg/dL  Comprehensive metabolic panel  Result Value Ref Range   Sodium 138 135 - 145 mmol/L   Potassium 5.2 (H) 3.5 - 5.1 mmol/L   Chloride 101 101 - 111 mmol/L   CO2 29 22 - 32 mmol/L   Glucose, Bld 257 (H) 65 - 99 mg/dL   BUN 24 (H) 6 - 20 mg/dL   Creatinine, Ser 1.43 (H) 0.61 - 1.24 mg/dL   Calcium 9.0 8.9 - 10.3 mg/dL   Total Protein 6.6 6.5 - 8.1 g/dL   Albumin 3.3 (L) 3.5 - 5.0 g/dL   AST 29 15 - 41 U/L   ALT 22 17 - 63 U/L   Alkaline Phosphatase 80 38 - 126 U/L   Total Bilirubin 0.5 0.3 - 1.2 mg/dL   GFR calc non Af Amer 47 (L) >60 mL/min   GFR calc Af Amer 55 (L) >60 mL/min   Anion gap 8 5 - 15  Prealbumin  Result Value Ref Range   Prealbumin 26.0 18 - 38 mg/dL  Glucose, capillary  Result Value Ref Range   Glucose-Capillary 243 (H) 65 - 99 mg/dL   Comment 1 Notify RN    Comment 2 Document in Chart   Glucose, capillary  Result Value Ref Range   Glucose-Capillary 308 (H) 65 - 99 mg/dL   Comment 1 Notify RN    Comment 2 Document in Chart   Glucose, capillary  Result Value Ref Range   Glucose-Capillary 28 (LL) 65 - 99 mg/dL   Comment 1 Notify RN    Comment 2 Document in Chart   Glucose, capillary  Result Value Ref Range   Glucose-Capillary 29 (LL) 65 - 99 mg/dL   Comment 1 Notify RN    Comment 2 Document in Chart   Glucose, capillary  Result Value Ref Range   Glucose-Capillary 127 (H) 65 - 99 mg/dL  Glucose, capillary  Result Value Ref Range   Glucose-Capillary 110 (H) 65 - 99 mg/dL  Glucose, capillary  Result Value Ref Range   Glucose-Capillary 82 65 - 99 mg/dL  Glucose, capillary  Result Value Ref Range   Glucose-Capillary 129 (H) 65 - 99 mg/dL  Glucose, capillary  Result Value Ref Range    Glucose-Capillary 170 (H) 65 - 99 mg/dL   Comment 1 Notify RN    Comment 2 Document in Chart   POCT Activated clotting time  Result Value Ref Range   Activated Clotting Time 331 seconds  POCT Activated clotting time  Result Value Ref Range   Activated Clotting Time 276 seconds  POCT Activated clotting time  Result Value Ref Range   Activated Clotting Time 233 seconds  POCT Activated clotting time  Result Value Ref Range   Activated Clotting Time 202 seconds  POCT Activated clotting time  Result Value Ref Range   Activated Clotting Time 184 seconds   MDM   Final diagnoses:  Toe swelling  Cellulitis of fifth toe, left  Cellulitis of  middle toe, left    Cellulitis of the left third and fifth toes which has failed outpatient management. He will need to be admitted for intravenous antibiotics.  WBC is normal but sedimentation rate is significantly elevated. He is started on intravenous vancomycin. Case is discussed with Dr. Hal Hope have tried hospitalist agrees to admit the patient.    Delora Fuel, MD 43/32/95 1884

## 2015-02-18 NOTE — Progress Notes (Signed)
Attempted to call ED RN to get report on pt. ED RN is to call back.

## 2015-02-18 NOTE — Progress Notes (Signed)
ANTIBIOTIC CONSULT NOTE - INITIAL  Pharmacy Consult for Vancomycin and Zosyn Indication: cellulitis  Allergies  Allergen Reactions  . Lipitor [Atorvastatin] Other (See Comments)    Caused pain all over body.  . Ace Inhibitors Other (See Comments)    dizzy  . Angiotensin Receptor Blockers Other (See Comments)    unknown  . Bee Venom Swelling  . Cymbalta [Duloxetine Hcl] Other (See Comments)    dizzy    Patient Measurements: Height: _0  (167.6 cm) Weight: 123 lb 14.4 oz (56.2 kg) IBW/kg (Calculated) : 63.8 Adjusted Body Weight:   Vital Signs: Temp: 97.8 F (36.6 C) (06/13 2153) Temp Source: Oral (06/13 2153) BP: 154/88 mmHg (06/13 2153) Pulse Rate: 66 (06/13 2153) Intake/Output from previous day:   Intake/Output from this shift:    Labs:  Recent Labs  02/18/15 1747 02/18/15 1748  WBC 8.9  --   HGB 10.5*  --   PLT 262  --   CREATININE 1.35* 1.43*   Estimated Creatinine Clearance: 37.1 mL/min (by C-G formula based on Cr of 1.43). No results for input(s): VANCOTROUGH, VANCOPEAK, VANCORANDOM, GENTTROUGH, GENTPEAK, GENTRANDOM, TOBRATROUGH, TOBRAPEAK, TOBRARND, AMIKACINPEAK, AMIKACINTROU, AMIKACIN in the last 72 hours.   Microbiology: No results found for this or any previous visit (from the past 720 hour(s)).  Medical History: Past Medical History  Diagnosis Date  . CAD (coronary artery disease)     LAD 40-50% stenosis, first diagonal small 90% stenosis, circumflex 70% stenosis, OM 80% stenosis, right coronary artery 80-90% stenosis.  . Right upper lobe pneumonia 11/28/2008  . Anxiety 07/18/2008  . Irritable bowel syndrome 04/27/2008  . COPD (chronic obstructive pulmonary disease) 02/27/2008  . B12 deficiency 02/16/2008    in 5/09: B12 262, MMA 1040  . Chronic diarrhea 01/01/2008    s/p EGD 10/06: H pylori + gastritis, duodenal biopsy normal (Dr. Benson Norway); s/p colonoscopy 10/06: hemorrhoids (Dr. Benson Norway); evaluated by Dr. Scarlette Shorts  in 8/09: tissue  transglutaminase AB negative, VIP normal, stool fat content normal, urine 5-HIAA normal; normal GES 11/09 (done for "fluctuating sugars")  . 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 Orthostatic hypotension 02/09/2007  . Hypertension 02/09/2007  . Spinal stenosis in cervical region 05/03    MRI  . Erectile dysfunction 09/27/2006    s/p penile implant  . Anemia, iron deficiency 09/27/2006    neg colonoscopy 2006 - Dr. Benson Norway; ferritin 152; hgb  15.7  on 07/07  . Hypothyroidism 09/27/2006    TSH 2.639  07/07  . Hypersomnia 09/27/2006    evaluated by Dr. Baird Lyons (5/08) and Dr. Brett Fairy; PSG 8/09: chronic delayed sleep phase syndrome (patient sleeps during the day and is awake at night), nocturnal myoclonus (eval for RLS and IDA suggested). Pt advised to change sleeping behavior  . Diabetes mellitus type II 09/07/1984    poorly controlled, complicated by peripheral neuropathy, microalbuminuria, mild non proliferative retinopathy, s/p DKA 7/05, on insulin pump x 4/09, started a pump vacation on 11/19/2008  . Hyperlipidemia   . 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 Hemorrhoids   . Chronic kidney disease   . Unspecified hereditary and idiopathic peripheral neuropathy   . Other chronic pain   . Other malaise and fatigue   . Depression   . Hypertrophy of prostate without urinary obstruction and other lower urinary tract symptoms (LUTS)   . Lumbago   . Heart attack 03/2014    mild    Medications:  Prescriptions prior to admission  Medication Sig Dispense Refill Last Dose  .  amLODipine (NORVASC) 2.5 MG tablet Take 2.5 mg by mouth daily.   02/18/2015 at Unknown time  . aspirin 81 MG tablet Take 81 mg by mouth daily.    02/18/2015 at Unknown time  . ciprofloxacin (CIPRO) 500 MG tablet Take 1 tablet (500 mg total) by mouth 2 (two) times daily. 20 tablet 0 02/18/2015 at Unknown time  . clindamycin (CLEOCIN) 300 MG capsule Take 1 capsule (300 mg total) by mouth 3 (three) times daily.  30 capsule 2 02/18/2015 at Unknown time  . clopidogrel (PLAVIX) 75 MG tablet TAKE 1 TABLET BY MOUTH DAILY WITH BREAKFAST 30 tablet 3 02/18/2015 at Unknown time  . Continuous Glucose Monitor (DEXCOM G4 PLATINUM RECEIVER) KIT USE AS DIRECTED 1 kit 1 unknown  . diphenoxylate-atropine (LOMOTIL) 2.5-0.025 MG per tablet TAKE ONE OR TWO TABLETS BY MOUTH TWICE DAILY AS NEEDED  (Patient taking differently: TAKE ONE OR TWO TABLETS BY MOUTH TWICE DAILY AS NEEDED FOR LOOSE STOOLS) 60 tablet 0 1-2 MONTHS AGO  . escitalopram (LEXAPRO) 20 MG tablet TAKE ONE TABLET BY MOUTH ONE TIME DAILY 30 tablet 5 02/18/2015 at Unknown time  . gabapentin (NEURONTIN) 600 MG tablet TAKE ONE TABLET BY MOUTH THREE TIMES DAILY  (Patient taking differently: Take 1/2 tablet daily) 90 tablet 1 02/18/2015 at Unknown time  . HYDROcodone-acetaminophen (NORCO/VICODIN) 5-325 MG per tablet Take one tablet by mouth five times daily as needed for pain 150 tablet 0 02/18/2015 at Unknown time  . Insulin Glargine (TOUJEO SOLOSTAR) 300 UNIT/ML SOPN Inject 11 Units into the skin at bedtime.    02/17/2015 at Unknown time  . insulin lispro (HUMALOG) 100 UNIT/ML KiwkPen Inject 1-6 Units into the skin daily as needed (100-199 = 1 units, 200-299 = 2 units, 300-399 = 3 units, 400-499 = 4 units, 500-599 = 5 units, 600+ = 6 units).    02/18/2015 at Unknown time  . levothyroxine (SYNTHROID, LEVOTHROID) 75 MCG tablet TAKE ONE TABLET BY MOUTH ONE TIME DAILY  30 tablet 5 02/18/2015 at Unknown time  . methylphenidate (RITALIN) 20 MG tablet Take 1 tablet (20 mg total) by mouth 2 (two) times daily. (Patient taking differently: Take 20 mg by mouth daily. ) 180 tablet 0 02/18/2015 at Unknown time  . metoCLOPramide (REGLAN) 5 MG tablet USE ONCE A DAY AFTER LUCHTIME MEAL 90 tablet 1 02/17/2015 at Unknown time  . metoprolol tartrate (LOPRESSOR) 25 MG tablet TAKE ONE TABLET BY MOUTH TWICE DAILY  60 tablet 5 02/18/2015 at 0800  . ONE TOUCH ULTRA TEST test strip TEST 8 TIMES PER DAY FOR  30 DAYS 250 each 4 unknown  . pantoprazole (PROTONIX) 40 MG tablet Take one tablet by mouth once daily for stomach (Patient taking differently: Take 40 mg by mouth daily. ) 30 tablet 5 02/18/2015 at Unknown time  . pravastatin (PRAVACHOL) 20 MG tablet Take 1 tablet (20 mg total) by mouth every evening. 90 tablet 3 02/17/2015 at Unknown time  . pregabalin (LYRICA) 50 MG capsule 1-2 capsules at bed to help neuropathic pain (Patient taking differently: Take 100 mg by mouth at bedtime. ) 60 capsule 5 02/17/2015 at Unknown time  . silver sulfADIAZINE (SILVADENE) 1 % cream Apply 1 application topically daily. 50 g 0 02/18/2015 at Unknown time  . vitamin B-12 (CYANOCOBALAMIN) 1000 MCG tablet Take 1,000 mcg by mouth daily.   02/18/2015 at Unknown time  . amLODipine (NORVASC) 5 MG tablet Take 1 tablet (5 mg total) by mouth daily. (Patient not taking: Reported on 02/18/2015) 180 tablet  3 Not Taking at Unknown time  . doxycycline (VIBRA-TABS) 100 MG tablet Take 1 tablet (100 mg total) by mouth 2 (two) times daily. (Patient not taking: Reported on 02/18/2015) 20 tablet 0 Not Taking at Unknown time  . testosterone cypionate (DEPOTESTOTERONE CYPIONATE) 200 MG/ML injection Inject 200 mg every 14 days (Patient not taking: Reported on 02/18/2015) 10 mL 0 Not Taking at Unknown time   Scheduled:  . [START ON 2/33/0076] folic acid  1 mg Oral Daily  . heparin  5,000 Units Subcutaneous 3 times per day  . insulin aspart  0-15 Units Subcutaneous Q6H  . insulin glargine  11 Units Subcutaneous QHS  . [START ON 02/19/2015] multivitamin with minerals  1 tablet Oral Daily  . sodium chloride  3 mL Intravenous Q12H  . [START ON 02/19/2015] thiamine  100 mg Oral Daily   Or  . [START ON 02/19/2015] thiamine  100 mg Intravenous Daily   Infusions:  . sodium chloride     Assessment: 74yo male presents with suspected infection in L foot. Pharmacy is consulted to dose vancomycin and zosyn for suspected cellulitis. WBC wnl and sCr  1.4.  Goal of Therapy:  Vancomycin trough level 10-15 mcg/ml  Plan:  Vancomycin 746m IV q24h Zosyn 3.375g IV q8h Measure antibiotic drug levels at steady state Follow up culture results, renal function, and clinical course  CAndrey Cota BDiona Foley PharmD Clinical Pharmacist Pager [phone] 23876/13/2016,10:12 PM

## 2015-02-18 NOTE — ED Notes (Signed)
IV attempt x 2 without success; second RN to attempt

## 2015-02-18 NOTE — ED Notes (Signed)
Admitting MD at BS.  

## 2015-02-19 ENCOUNTER — Encounter (HOSPITAL_COMMUNITY)
Admission: EM | Disposition: A | Discharge status: Home / Self Care | Payer: Self-pay | Source: Home / Self Care | Attending: Internal Medicine

## 2015-02-19 ENCOUNTER — Encounter (HOSPITAL_COMMUNITY): Payer: Medicare Other

## 2015-02-19 ENCOUNTER — Inpatient Hospital Stay (HOSPITAL_COMMUNITY): Payer: Medicare Other

## 2015-02-19 ENCOUNTER — Encounter: Payer: Self-pay | Admitting: Podiatry

## 2015-02-19 DIAGNOSIS — J438 Other emphysema: Secondary | ICD-10-CM

## 2015-02-19 DIAGNOSIS — I10 Essential (primary) hypertension: Secondary | ICD-10-CM

## 2015-02-19 DIAGNOSIS — I70244 Atherosclerosis of native arteries of left leg with ulceration of heel and midfoot: Secondary | ICD-10-CM

## 2015-02-19 DIAGNOSIS — I739 Peripheral vascular disease, unspecified: Secondary | ICD-10-CM

## 2015-02-19 HISTORY — PX: PERIPHERAL VASCULAR CATHETERIZATION: SHX172C

## 2015-02-19 LAB — GLUCOSE, CAPILLARY
GLUCOSE-CAPILLARY: 110 mg/dL — AB (ref 65–99)
GLUCOSE-CAPILLARY: 170 mg/dL — AB (ref 65–99)
GLUCOSE-CAPILLARY: 308 mg/dL — AB (ref 65–99)
Glucose-Capillary: 127 mg/dL — ABNORMAL HIGH (ref 65–99)
Glucose-Capillary: 29 mg/dL — CL (ref 65–99)
Glucose-Capillary: 82 mg/dL (ref 65–99)
Glucose-Capillary: 129 mg/dL — ABNORMAL HIGH (ref 65–99)

## 2015-02-19 LAB — POCT ACTIVATED CLOTTING TIME
ACTIVATED CLOTTING TIME: 331 s
ACTIVATED CLOTTING TIME: 276 s
ACTIVATED CLOTTING TIME: 184 s
Activated Clotting Time: 233 seconds
Activated Clotting Time: 202 seconds

## 2015-02-19 LAB — PREALBUMIN: Prealbumin: 26 mg/dL (ref 18–38)

## 2015-02-19 SURGERY — ABDOMINAL AORTOGRAM

## 2015-02-19 MED ORDER — METOPROLOL TARTRATE 12.5 MG HALF TABLET
12.5000 mg | ORAL_TABLET | Freq: Two times a day (BID) | ORAL | Status: DC
  Administered 2015-02-19 – 2015-02-22 (×7): 12.5 mg via ORAL
  Filled 2015-02-19 (×9): qty 1

## 2015-02-19 MED ORDER — PRAVASTATIN SODIUM 40 MG PO TABS
20.0000 mg | ORAL_TABLET | Freq: Every evening | ORAL | Status: DC
  Administered 2015-02-20 – 2015-02-21 (×2): 20 mg via ORAL
  Filled 2015-02-19 (×2): qty 1

## 2015-02-19 MED ORDER — ASPIRIN 81 MG PO TABS: 81.0000 mg | ORAL_TABLET | Freq: Every day | ORAL | Status: DC

## 2015-02-19 MED ORDER — LEVOTHYROXINE SODIUM 75 MCG PO TABS
75.0000 ug | ORAL_TABLET | Freq: Every day | ORAL | Status: DC
  Administered 2015-02-20 – 2015-02-22 (×3): 75 ug via ORAL
  Filled 2015-02-19 (×3): qty 1

## 2015-02-19 MED ORDER — NITROGLYCERIN 1 MG/10 ML FOR IR/CATH LAB
INTRA_ARTERIAL | Status: AC
  Filled 2015-02-19: qty 10

## 2015-02-19 MED ORDER — FENTANYL CITRATE (PF) 100 MCG/2ML IJ SOLN
INTRAMUSCULAR | Status: AC
  Filled 2015-02-19: qty 2

## 2015-02-19 MED ORDER — HYDRALAZINE HCL 20 MG/ML IJ SOLN: 5.0000 mg | INTRAMUSCULAR | Status: DC | PRN

## 2015-02-19 MED ORDER — ATROPINE SULFATE 0.1 MG/ML IJ SOLN
INTRAMUSCULAR | Status: AC
  Filled 2015-02-19: qty 10

## 2015-02-19 MED ORDER — LABETALOL HCL 5 MG/ML IV SOLN
10.0000 mg | INTRAVENOUS | Status: DC | PRN
  Filled 2015-02-19: qty 4

## 2015-02-19 MED ORDER — ESCITALOPRAM OXALATE 20 MG PO TABS
20.0000 mg | ORAL_TABLET | Freq: Every day | ORAL | Status: DC
  Administered 2015-02-20 – 2015-02-22 (×3): 20 mg via ORAL
  Filled 2015-02-19 (×4): qty 1

## 2015-02-19 MED ORDER — CLOPIDOGREL BISULFATE 75 MG PO TABS
75.0000 mg | ORAL_TABLET | Freq: Every day | ORAL | Status: DC
  Administered 2015-02-20 – 2015-02-22 (×3): 75 mg via ORAL
  Filled 2015-02-19 (×4): qty 1

## 2015-02-19 MED ORDER — VITAMIN B-12 1000 MCG PO TABS
1000.0000 ug | ORAL_TABLET | Freq: Every day | ORAL | Status: DC
  Administered 2015-02-19 – 2015-02-21 (×3): 1000 ug via ORAL
  Filled 2015-02-19 (×4): qty 1

## 2015-02-19 MED ORDER — SODIUM CHLORIDE 0.9 % IV SOLN: INTRAVENOUS | Status: DC

## 2015-02-19 MED ORDER — MIDAZOLAM HCL 2 MG/2ML IJ SOLN
INTRAMUSCULAR | Status: DC | PRN
  Administered 2015-02-19 (×3): 1 mg via INTRAVENOUS

## 2015-02-19 MED ORDER — CIPROFLOXACIN HCL 500 MG PO TABS
500.0000 mg | ORAL_TABLET | Freq: Two times a day (BID) | ORAL | Status: DC
  Administered 2015-02-19 – 2015-02-20 (×2): 500 mg via ORAL
  Filled 2015-02-19 (×3): qty 1

## 2015-02-19 MED ORDER — MIDAZOLAM HCL 2 MG/2ML IJ SOLN
INTRAMUSCULAR | Status: AC
  Filled 2015-02-19: qty 2

## 2015-02-19 MED ORDER — FENTANYL CITRATE (PF) 100 MCG/2ML IJ SOLN
INTRAMUSCULAR | Status: DC | PRN
  Administered 2015-02-19 (×3): 25 ug via INTRAVENOUS

## 2015-02-19 MED ORDER — DEXTROSE 50 % IV SOLN
INTRAVENOUS | Status: AC
  Administered 2015-02-19: 25 mL
  Filled 2015-02-19: qty 50

## 2015-02-19 MED ORDER — INSULIN LISPRO 100 UNIT/ML (KWIKPEN)
1.0000 [IU] | PEN_INJECTOR | Freq: Every day | SUBCUTANEOUS | Status: DC | PRN
Start: 2015-02-19 — End: 2015-02-19

## 2015-02-19 MED ORDER — IODIXANOL 320 MG/ML IV SOLN
INTRAVENOUS | Status: DC | PRN
  Administered 2015-02-19: 120 mL via INTRA_ARTERIAL

## 2015-02-19 MED ORDER — HEPARIN SODIUM (PORCINE) 1000 UNIT/ML IJ SOLN
INTRAMUSCULAR | Status: AC
  Filled 2015-02-19: qty 1

## 2015-02-19 MED ORDER — DEXTROSE 50 % IV SOLN: 12.5000 g | Freq: Once | INTRAVENOUS | Status: DC

## 2015-02-19 MED ORDER — NITROGLYCERIN 0.2 MG/ML ON CALL CATH LAB
INTRAVENOUS | Status: AC
  Filled 2015-02-19: qty 1

## 2015-02-19 MED ORDER — AMLODIPINE BESYLATE 5 MG PO TABS
2.5000 mg | ORAL_TABLET | Freq: Every day | ORAL | Status: DC
  Administered 2015-02-20 – 2015-02-22 (×3): 2.5 mg via ORAL
  Filled 2015-02-19 (×3): qty 1

## 2015-02-19 MED ORDER — METOPROLOL TARTRATE 25 MG PO TABS
25.0000 mg | ORAL_TABLET | Freq: Two times a day (BID) | ORAL | Status: DC
  Administered 2015-02-19: 25 mg via ORAL
  Filled 2015-02-19 (×2): qty 1

## 2015-02-19 MED ORDER — HEPARIN SODIUM (PORCINE) 1000 UNIT/ML IJ SOLN
INTRAMUSCULAR | Status: DC | PRN
  Administered 2015-02-19: 7000 [IU] via INTRAVENOUS

## 2015-02-19 MED ORDER — DEXTROSE-NACL 5-0.9 % IV SOLN
INTRAVENOUS | Status: DC
  Administered 2015-02-19: 08:00:00 via INTRAVENOUS

## 2015-02-19 MED ORDER — CLINDAMYCIN HCL 300 MG PO CAPS
300.0000 mg | ORAL_CAPSULE | Freq: Three times a day (TID) | ORAL | Status: DC
  Administered 2015-02-19 – 2015-02-20 (×2): 300 mg via ORAL
  Filled 2015-02-19 (×4): qty 1

## 2015-02-19 SURGICAL SUPPLY — 24 items
BALLN LUTONIX DCB 5X40X130 (BALLOONS) ×4
BALLOON FOX SV 5.0X30 (BALLOONS) ×4 IMPLANT
BALLOON LUTONIX DCB 5X40X130 (BALLOONS) ×2 IMPLANT
CATH OMNI FLUSH 5F 65CM (CATHETERS) ×4 IMPLANT
CATH QUICKCROSS .035X135CM (MICROCATHETER) ×4 IMPLANT
COVER PRB 48X5XTLSCP FOLD TPE (BAG) ×2 IMPLANT
COVER PROBE 5X48 (BAG) ×4
DEVICE TORQUE H2O (MISCELLANEOUS) ×4 IMPLANT
DIAMONDBACK CLASSIC OAS 2.0MM (CATHETERS) ×4
GLIDEWIRE ANGLED SS 035X260CM (WIRE) ×4 IMPLANT
GUIDEWIRE ANGLED .035X260CM (WIRE) ×4 IMPLANT
KIT ENCORE 26 ADVANTAGE (KITS) ×4 IMPLANT
KIT MICROINTRODUCER STIFF 5F (SHEATH) ×4 IMPLANT
KIT PV (KITS) ×4 IMPLANT
LUBRICANT VIPERSLIDE CORONARY (MISCELLANEOUS) ×4 IMPLANT
SHEATH PINNACLE 5F 10CM (SHEATH) ×4 IMPLANT
SHEATH PINNACLE ST 7F 45CM (SHEATH) ×4 IMPLANT
SHIELD RADPAD SCOOP 12X17 (MISCELLANEOUS) ×4 IMPLANT
SYR MEDRAD MARK V 150ML (SYRINGE) ×4 IMPLANT
SYSTEM DIMODBCK CLSC OAS 2.0MM (CATHETERS) ×2 IMPLANT
TRANSDUCER W/STOPCOCK (MISCELLANEOUS) ×4 IMPLANT
TRAY PV CATH (CUSTOM PROCEDURE TRAY) ×4 IMPLANT
WIRE BENTSON .035X145CM (WIRE) ×4 IMPLANT
WIRE VIPER ADVANCE .017X335CM (WIRE) ×4 IMPLANT

## 2015-02-19 NOTE — Progress Notes (Signed)
Pt's CBG 308. Pt refused to take his insulin according to insulin scale, stating "I only need 3 units of insulin. My blood sugar will get down below 50 if I take that much of insulin. It's going to kill me." Schorr MD paged, and notified.  MD called back and instructed to give pt 3 units of insulin as he wanted.

## 2015-02-19 NOTE — Progress Notes (Signed)
Patient ID: Benjamin Hodge, male   DOB: 03-Jul-1941, 74 y.o.   MRN: [id number]  Subjective: 74 year old male presents the office they for follow-up evaluation of left third toe swelling, redness. He states he has continued antibiotic although still does remain swollen and red still. He believes the redness is quite intense is what was previously although it does persist. He has not been wearing the surgical shoe. He states that he has been trying to apply bandage the toe daily all of the does go times without a bandage. He states he has developed a large blister on the left 5th toe. He he states he has been wearing a shoe that is too big purposely to help take pressure off the 3rd toe. He believes the blister started after wearing a different shoe. He denies any drainage or purulence from the toe. Denies any systemic complaints such as fevers, chills, nausea, vomiting. No acute changes since last appointment, and no other complaints at this time.   Objective: AAO x3, NAD DP/PT pulses decreased bilaterally, CRT less than 3 seconds Protective sensation absent with Dorann Ou monofilament Left third digit is edematous and erythematous the level of the MTPJ distally. There is no ascending cellulitis. Ulceration on the plantar aspect of the toe at the level of the PIPJ which transverses the digit does probe to bone at this time and the flexor tendon is exposed. There is no undermining or tunneling. There is no drainage or purulence. No fluctuance or crepitance. Bulla overlying the dorsal lateral aspect of the fifth digit and the toenail has partially already fallen off and is very loose except for along the proximal medial nail border. The bulla encompasses the toenail. There is mild erythema to the digit without any ascending cellulitis or crepitus. No malodor. No areas of tenderness or pain with vibratory sensation. . No edema, erythema, increase in warmth to bilateral lower extremities.  No other  open lesions or pre-ulcerative lesions.  No pain with calf compression, swelling, warmth, erythema  Assessment: 74 year old male left third digit cellulitis, ulceration which probes to bone; bulla/onycholysis of 5th digit toenail.   Plan: -All treatment options discussed with the patient including all alternatives, risks, complications.  -At this, discussed likelihood of amputation of the digit with the patient in the near future. To toe appears to be stable right now. I have contacted Dr. Leretha Pol office for consultation due to abnormal arterial duplex.  -Continue clindamycin and ciprofloxacin.   -Continue with daily dressing changes.  -Wear surgical shoe AT ALL TIMES.  -He states that he is going on vacation next week to the beach. I recommended him not to go to the beach. -Monitor closely for any worsening signs or symptoms of infection and directed to go directly to the ER if any are to occur. -Follow-up in 1 week, or sooner should any problems arise.  -Patient encouraged to call the office with any questions, concerns, change in symptoms.

## 2015-02-19 NOTE — Progress Notes (Signed)
Report given to [facility] RN,Jim.

## 2015-02-19 NOTE — Consult Note (Signed)
WOC reviewed chart.  [facility] has consulted on this patient this am as well as podiatry.  Repeat ABI pending.  See notes from podiatry with notes for daily dressings.  For this reason will not consult at this time.     Re consult if needed, will not follow at this time. Thanks  Estephania Licciardi [employer], CWOCN [phone])

## 2015-02-19 NOTE — Consult Note (Signed)
[facility] [place] HISTORY AND PHYSICAL   History of Present Illness:  Patient is a 74 y.o. year old male who presents for evaluation of non healing wound left foot.  Pt developed ulcer on his left foot approximately 3 weeks ago.  This was followed by Dr Loreta Ave, podiatry but has slowly progressed.  Pt came to ER last night due to increased drainage from foot and skin changes and Dr Loreta Ave wanted him to get IV antibiotics.  Recent non invasive arterial exam but results unavailable for review this morning .  Other medical problems include diabetes, former tobacco abuse, CAD, lumbar stenosis.  All currently stable.  He denies fever chills nausea or vomiting.  Past Medical History  Diagnosis Date  . CAD (coronary artery disease)     LAD 40-50% stenosis, first diagonal small 90% stenosis, circumflex 70% stenosis, OM 80% stenosis, right coronary artery 80-90% stenosis.  . Right upper lobe pneumonia 11/28/2008  . Anxiety 07/18/2008  . Irritable bowel syndrome 04/27/2008  . COPD (chronic obstructive pulmonary disease) 02/27/2008  . B12 deficiency 02/16/2008    in 5/09: B12 262, MMA 1040  . Chronic diarrhea 01/01/2008    s/p EGD 10/06: H pylori + gastritis, duodenal biopsy normal (Dr. Elnoria Howard); s/p colonoscopy 10/06: hemorrhoids (Dr. Elnoria Howard); evaluated by Dr. Yancey Flemings  in 8/09: tissue transglutaminase AB negative, VIP normal, stool fat content normal, urine 5-HIAA normal; normal GES 11/09 (done for "fluctuating sugars")  . 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 Orthostatic hypotension 02/09/2007  . Hypertension 02/09/2007  . Spinal stenosis in cervical region 05/03    MRI  . Erectile dysfunction 09/27/2006    s/p penile implant  . Anemia, iron deficiency 09/27/2006    neg colonoscopy 2006 - Dr. Elnoria Howard; ferritin 152; hgb  15.7  on 07/07  . Hypothyroidism 09/27/2006    TSH 2.639  07/07  . Hypersomnia 09/27/2006    evaluated by Dr. Jetty Duhamel (5/08) and  Dr. Vickey Huger; PSG 8/09: chronic delayed sleep phase syndrome (patient sleeps during the day and is awake at night), nocturnal myoclonus (eval for RLS and IDA suggested). Pt advised to change sleeping behavior  . Diabetes mellitus type II 09/07/1984    poorly controlled, complicated by peripheral neuropathy, microalbuminuria, mild non proliferative retinopathy, s/p DKA 7/05, on insulin pump x 4/09, started a pump vacation on 11/19/2008  . Hyperlipidemia   . 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 Hemorrhoids   . Chronic kidney disease   . Unspecified hereditary and idiopathic peripheral neuropathy   . Other chronic pain   . Other malaise and fatigue   . Depression   . Hypertrophy of prostate without urinary obstruction and other lower urinary tract symptoms (LUTS)   . Lumbago   . Heart attack 03/2014    mild    Past Surgical History  Procedure Laterality Date  . Penile prosthesis implant    . Tonsillectomy    . Colonoscopy  06/25/2005    Dr. Jeani Hawking  . Left heart catheterization with coronary angiogram [name] 03/13/2014    Procedure: LEFT HEART CATHETERIZATION WITH CORONARY ANGIOGRAM;  Surgeon: Peter M Swaziland, MD;  [facility] CATH LAB;  Service: Cardiovascular;  Laterality: [name];    Social History History  Substance Use Topics  . Smoking status: Former Smoker    Types: Cigarettes    Quit date: 09/28/2004  . Smokeless tobacco: Never Used  . Alcohol Use: 0.0 oz/week    0 Standard drinks or equivalent per week  Comment: occ/social (moderate)    Family History Family History  Problem Relation Age of Onset  . Bone cancer Mother   . Breast cancer Mother   . Cancer Mother   . Lung cancer Father   . Cancer Father   . Breast cancer Sister   . Cancer Sister   . Lung cancer Brother   . Cancer Sister     type unknown    Allergies  Allergies  Allergen Reactions  . Lipitor [Atorvastatin] Other (See Comments)    Caused pain all over body.  . Ace Inhibitors Other (See Comments)     dizzy  . Angiotensin Receptor Blockers Other (See Comments)    unknown  . Bee Venom Swelling  . Cymbalta [Duloxetine Hcl] Other (See Comments)    dizzy     Current Facility-Administered Medications  Medication Dose Route Frequency Provider Last Rate Last Dose  . acetaminophen (TYLENOL) tablet 650 mg  650 mg Oral Q6H PRN Rolly Salter, MD       Or  . acetaminophen (TYLENOL) suppository 650 mg  650 mg Rectal Q6H PRN Rolly Salter, MD      . dextrose 5 %-0.9 % sodium chloride infusion   Intravenous Continuous Roma Kayser Schorr, NP      . dextrose 50 % solution 12.5 g  12.5 g Intravenous Once Catarina Hartshorn, MD   12.5 g at 02/19/15 0622  . folic acid (FOLVITE) tablet 1 mg  1 mg Oral Daily Rolly Salter, MD      . heparin injection 5,000 Units  5,000 Units Subcutaneous 3 times per day Rolly Salter, MD   5,000 Units at 02/19/15 0536  . insulin aspart (novoLOG) injection 0-15 Units  0-15 Units Subcutaneous Q6H Rolly Salter, MD   3 Units at 02/19/15 0114  . insulin glargine (LANTUS) injection 11 Units  11 Units Subcutaneous QHS Rolly Salter, MD   11 Units at 02/18/15 2324  . LORazepam (ATIVAN) tablet 1 mg  1 mg Oral Q6H PRN Rolly Salter, MD       Or  . LORazepam (ATIVAN) injection 1 mg  1 mg Intravenous Q6H PRN Rolly Salter, MD      . metoprolol tartrate (LOPRESSOR) tablet 12.5 mg  12.5 mg Oral BID Rolly Salter, MD   12.5 mg at 02/19/15 0113  . multivitamin with minerals tablet 1 tablet  1 tablet Oral Daily Rolly Salter, MD      . ondansetron [facility]) tablet 4 mg  4 mg Oral Q6H PRN Rolly Salter, MD       Or  . ondansetron [facility]) injection 4 mg  4 mg Intravenous Q6H PRN Rolly Salter, MD      . piperacillin-tazobactam (ZOSYN) IVPB 3.375 g  3.375 g Intravenous Q8H Marquita Palms, RPH   3.375 g at 02/19/15 0536  . sodium chloride 0.9 % injection 3 mL  3 mL Intravenous Q12H Rolly Salter, MD   3 mL at 02/18/15 2215  . thiamine (VITAMIN B-1) tablet 100 mg  100 mg Oral Daily Rolly Salter, MD       Or  . thiamine (B-1) injection 100 mg  100 mg Intravenous Daily Rolly Salter, MD      . vancomycin (VANCOCIN) IVPB 750 mg/150 ml premix  750 mg Intravenous Q24H Marquita Palms, RPH        ROS:   General:  No weight loss, Fever, chills  HEENT: No recent headaches, no nasal bleeding, no visual changes, no sore throat  Neurologic: No dizziness, blackouts, seizures. No recent symptoms of stroke or mini- stroke. No recent episodes of slurred speech, or temporary blindness.  Cardiac: No recent episodes of chest pain/pressure, no shortness of breath at rest.  + shortness of breath with exertion.  Denies history of atrial fibrillation or irregular heartbeat  Vascular: No history of rest pain in feet.  No history of claudication.  + history of non-healing ulcer, No history of DVT   Pulmonary: No home oxygen, no productive cough, no hemoptysis,  No asthma or wheezing  Musculoskeletal:  [x ] Arthritis, [x ] Low back pain,  [x ] Joint pain  Hematologic:No history of hypercoagulable state.  No history of easy bleeding.  No history of anemia  Gastrointestinal: No hematochezia or melena,  No gastroesophageal reflux, no trouble swallowing  Urinary:  chronic Kidney disease,  on HD -  MWF or  TTHS,  Burning with urination,  Frequent urination,  Difficulty urinating;   Skin: No rashes  Psychological: No history of anxiety,  No history of depression   Physical Examination  Filed Vitals:   02/18/15 2153 02/19/15 0007 02/19/15 0220 02/19/15 0600  BP: 154/88 188/81 159/76 161/67  Pulse: 66 74 62 60  Temp: 97.8 F (36.6 C) 97.7 F (36.5 C)  97.7 F (36.5 C)  TempSrc: Oral Oral  Oral  Resp: Height:  (1.676 m)     Weight: 123 lb 14.4 oz (56.2 kg)   124 lb 1.9 oz (56.3 kg)  SpO2: 100% 98%  98%    Body mass index is 20.04 kg/(m^2).  General:  Alert and oriented, no acute distress HEENT: Normal Neck: No  JVD Pulmonary: Clear to  auscultation bilaterally Cardiac: Regular Rate and Rhythm  Abdomen: Soft, non-tender, non-distended, no mass Skin: No rash, toe 3 left foot deep crack on plantar aspect most likely extending to bone, early gangrene left 5th toe Extremity Pulses:  2+ radial, brachial, femoral,1-2 + left popliteal pulse, absent dorsalis pedis, posterior tibial pulses bilaterally Musculoskeletal: No deformity or edema  Neurologic: Upper and lower extremity motor 5/5 and symmetric  DATA:   CBC    Component Value Date/Time   WBC 8.9 02/18/2015 1747   WBC 6.0 01/01/2015 1421   WBC 5.3 11/29/2013 1507   RBC 3.33* 02/18/2015 1747   RBC 3.40* 01/01/2015 1421   RBC 3.37* 11/29/2013 1507   RBC 3.02* 11/26/2008 0727   HGB 10.5* 02/18/2015 1747   HGB 11.2* 11/29/2013 1507   HCT 31.3* 02/18/2015 1747   HCT 32.8* 01/01/2015 1421   HCT 34.4* 11/29/2013 1507   PLT 262 02/18/2015 1747   PLT 215 11/29/2013 1507   MCV 94.0 02/18/2015 1747   MCV 102.1* 11/29/2013 1507   MCH 31.5 02/18/2015 1747   MCH 30.9 01/01/2015 1421   MCH 33.2 11/29/2013 1507   MCHC 33.5 02/18/2015 1747   MCHC 32.0 01/01/2015 1421   MCHC 32.5 11/29/2013 1507   RDW 13.8 02/18/2015 1747   RDW 14.3 01/01/2015 1421   RDW 13.4 11/29/2013 1507   LYMPHSABS 0.9 02/18/2015 1747   LYMPHSABS 1.0 01/01/2015 1421   LYMPHSABS 0.9 11/29/2013 1507   MONOABS 1.0 02/18/2015 1747   MONOABS 0.5 11/29/2013 1507   EOSABS 0.2 02/18/2015 1747   EOSABS 0.3 11/29/2013 1507   BASOSABS 0.0 02/18/2015 1747   BASOSABS 0.0 01/01/2015 1421  BASOSABS 0.0 11/29/2013 1507    BMET    Component Value Date/Time   NA 138 02/18/2015 1748   NA 137 01/01/2015 1421   NA 143 11/29/2013 1507   K 5.2* 02/18/2015 1748   K 5.0 11/29/2013 1507   CL 101 02/18/2015 1748   CL 108* 11/07/2012 1507   CO2 29 02/18/2015 1748   CO2 26 11/29/2013 1507   GLUCOSE 257* 02/18/2015 1748   GLUCOSE 368* 01/01/2015 1421   GLUCOSE 156* 11/29/2013 1507   GLUCOSE 256* 11/07/2012 1507    BUN 24* 02/18/2015 1748   BUN 20 01/01/2015 1421   BUN 20.9 11/29/2013 1507   CREATININE 1.43* 02/18/2015 1748   CREATININE 1.1 11/29/2013 1507   CALCIUM 9.0 02/18/2015 1748   CALCIUM 9.3 11/29/2013 1507   GFRNONAA 47* 02/18/2015 1748   GFRAA 55* 02/18/2015 1748    ASSESSMENT:  Left foot with most likely 2 non salvageable toes, at risk of limb loss   PLAN:  1.  Will try to get non invasive results later today 2.  Repeat ABI  3. Agram possible intervention later today by my partner Dr Myra Gianotti, keep NPO  4. Continue antibiotics.  Fabienne Bruns, MD [facility] [place] Office: [phone] Pager: [phone]

## 2015-02-19 NOTE — Progress Notes (Signed)
VASCULAR LAB PRELIMINARY  ARTERIAL  ABI completed:    RIGHT    LEFT    PRESSURE WAVEFORM  PRESSURE WAVEFORM  BRACHIAL 176 Triphasic BRACHIAL 178 Tri[hasic  DP >311 Mono[phasic DP >319 Monophasic  PT >311 Monophasic PT >307 Monophasic  GREAT TOE 60 NA GREAT TOE 20 NA    RIGHT LEFT  ABI/ TBI NA/0.34 NA/0.11   ABIs could not be ascertained due to non compressible vessels. TBIs bilaterally indicate perfusion left less than right  Jayro Mcmath,RVS 02/19/2015, 1:36 PM

## 2015-02-19 NOTE — Progress Notes (Signed)
Inpatient Diabetes Program Recommendations  AACE/ADA: New Consensus Statement on Inpatient Glycemic Control (2013)  Target Ranges:  Prepandial:   less than 140 mg/dL      Peak postprandial:   less than 180 mg/dL (1-2 hours)      Critically ill patients:  140 - 180 mg/dL   Reason for Visit: Hypoglycemia  Diabetes history: DM2 Outpatient Diabetes medications: Lantus 11 units QHS, Humalog s/s Current orders for Inpatient glycemic control: Lantus 11 units QHS, Novolog moderate Q4H  Results for Benjamin Hodge, Benjamin Hodge (MRN [id number]) as of 02/19/2015 15:21  Ref. Range 02/18/2015 21:59 02/19/2015 00:10 02/19/2015 06:03 02/19/2015 06:08 02/19/2015 06:27 02/19/2015 08:07 02/19/2015 12:17  Glucose-Capillary Latest Ref Range: 65-99 mg/dL 962 (H) 836 (H) 28 (LL) 29 (LL) 127 (H) 110 (H) 82   Severe hypoglycemia.  Recommendations: Decrease Novolog to sensitive Q4H. Decrease Lantus to 8 units QHS.  Will follow daily. Thank you. Ailene Ards, RD, LDN, CDE Inpatient Diabetes Coordinator [phone]

## 2015-02-19 NOTE — Progress Notes (Signed)
Utilization Review completed. Amarys Sliwinski RN BSN CM 

## 2015-02-19 NOTE — Progress Notes (Addendum)
PROGRESS NOTE  Benjamin Hodge WVP:[id number] DOB: 08-02-1941 DOA: 02/18/2015 PCP: Estill Dooms, MD  Brief history 74 year old male with history of diabetes mellitus, peripheral vascular disease, coronary artery disease, hypertension, COPD presented with worsening ulceration of his left third toe and fifth toe. This all started back on 02/03/2015 when the patient was seen by podiatry and started on doxycycline for erythema about his right third toe. 72 hours later, the patient was switched to clindamycin and ciprofloxacin secondary to continued worsening. When he followed up with podiatry, there was some improvement in his third toe erythema, but there was a positive probe to bone test. However, the patient had an avulsion of his left fifth digit toenail that was noted by podiatry on 02/15/2015. He followed up again at the podiatry office on the day of admission. Because of worsening wounds on his lower extremities, the patient was advised for admission. Notably, the patient had an arterial duplex on 02/13/2015 which revealed 58-69 percent diameter reduction on the right mid SFA with 70-99 percent diameter reduction in the left proximal SFA. Vascular surgery was consulted at the time of admission.  Assessment/Plan: Diabetic foot infection -The patient has a positive probe to bone test on his left third toe suggestive of clinical osteomyelitis -Appreciate vascular surgery consult--> plans noted for angiogram  -Start the patient on intravenous fluids as he has a degree of renal insufficiency  -I am concerned that his left fifth toe is not salvageable, but will defer to vascular surgery  -I feel that the patient has a primary vascular issue with secondary infection that has compromised his toes -Continue antibiotics for now  -ESR 50, CRP <0.5 Peripheral vascular disease  -Appreciate vascular surgery consult  -Plan started for possible angiogram  -Please obtain any intraoperative  cultures if possible  Hypertension  -Restart metoprolol and amlodipine once the patient is able to take po CAD with hx of NSTEMI -Continue aspirin  -Continue Plavix for now--he was discharged with aspirin and Plavix after his admission in July 2015 -Heart catheterization 03/13/2014 revealed three-vessel disease--medical therapy was recommended  Diabetes mellitus type 2  -The patient appears to have poor insight  -01/01/2015 hemoglobin A1c 10.9  -Continue reduced dose of long-acting insulin (pt was on Toujeo) COPD  -Clinically stable on room air  -Patient has greater than 80-pack-year history  -Quit smoking 10 years ago  ADHD -continue Ritalin  Family Communication:   Pt at beside Disposition Plan:   Home when medically stable       Procedures/Studies: Dg Foot Complete Left  02/03/2015   3 views of a skeletally mature individual were obtained of the left foot.  Study includes AP, oblique, lateral projections.  There does appear to be a healed fracture of the lateral portion of the  proximal phalanx of the hallux however it is slightly malaligned and there  is changes of the first MTPJ. There is old healed fractures of the  metatarsals. Vessel calcifications present. There is no cortical  destruction discussed osteomyelitis at this time there is no soft tissue  emphysema. No definitive evidence of acute fracture.        Subjective: Patient denies fevers, chills, headache, chest pain, dyspnea, nausea, vomiting, diarrhea, abdominal pain, dysuria, hematuria   Objective: Filed Vitals:   02/19/15 0007 02/19/15 0220 02/19/15 0600 02/19/15 1219  BP: 188/81 159/76 161/67 118/69  Pulse: 74 62 60 57  Temp: 97.7 F (36.5 C)  97.7 F (  36.5 C) 98.1 F (36.7 C)  TempSrc: Oral  Oral Oral  Resp: 20  20 14   Height:      Weight:   56.3 kg (124 lb 1.9 oz)   SpO2: 98%  98% 100%    Intake/Output Summary (Last 24 hours) at 02/19/15 1222 Last data filed at 02/19/15 0847  Gross per 24  hour  Intake 478.33 ml  Output   2100 ml  Net -1621.67 ml   Weight change:  Exam:   General:  Pt is alert, follows commands appropriately, not in acute distress  HEENT: No icterus, No thrush, No neck mass, [place]/AT  Cardiovascular: RRR, S1/S2, no rubs, no gallops  Respiratory: CTA bilaterally, no wheezing, no crackles, no rhonchi  Abdomen: Soft/+BS, non tender, non distended, no guarding Extremities: Plantar surface of left third toe positive probe to bone with small amount of yellow drainage. Dusky left fifth toe. There is no crepitance, necrosis of the foot.  Data Reviewed: Basic Metabolic Panel:  Recent Labs Lab 02/18/15 1747 02/18/15 1748  NA 137 138  K 5.3* 5.2*  CL 102 101  CO2 27 29  GLUCOSE 264* 257*  BUN 24* 24*  CREATININE 1.35* 1.43*  CALCIUM 8.8* 9.0   Liver Function Tests:  Recent Labs Lab 02/18/15 1748  AST 29  ALT 22  ALKPHOS 80  BILITOT 0.5  PROT 6.6  ALBUMIN 3.3*   No results for input(s): LIPASE, AMYLASE in the last 168 hours. No results for input(s): AMMONIA in the last 168 hours. CBC:  Recent Labs Lab 02/18/15 1747  WBC 8.9  NEUTROABS 6.9  HGB 10.5*  HCT 31.3*  MCV 94.0  PLT 262   Cardiac Enzymes: No results for input(s): CKTOTAL, CKMB, CKMBINDEX, TROPONINI in the last 168 hours. BNP: Invalid input(s): POCBNP CBG:  Recent Labs Lab 02/19/15 0603 02/19/15 0608 02/19/15 0627 02/19/15 0807 02/19/15 1217  GLUCAP 28* 29* 127* 110* 82    No results found for this or any previous visit (from the past 240 hour(s)).   Scheduled Meds: . dextrose  12.5 g Intravenous Once  . folic acid  1 mg Oral Daily  . heparin  5,000 Units Subcutaneous 3 times per day  . insulin aspart  0-15 Units Subcutaneous Q6H  . insulin glargine  11 Units Subcutaneous QHS  . metoprolol tartrate  12.5 mg Oral BID  . multivitamin with minerals  1 tablet Oral Daily  . piperacillin-tazobactam (ZOSYN)  IV  3.375 g Intravenous Q8H  . sodium chloride  3 mL  Intravenous Q12H  . thiamine  100 mg Oral Daily   Or  . thiamine  100 mg Intravenous Daily  . vancomycin  750 mg Intravenous Q24H   Continuous Infusions: . dextrose 5 % and 0.9% NaCl 50 mL/hr at 02/19/15 0751     Iwao Shamblin, DO  [facility] Pager [phone]  If 7PM-7AM, please contact night-coverage [email or web address] Password [email or web address] 02/19/2015, 12:22 PM   LOS: 1 day

## 2015-02-19 NOTE — Progress Notes (Signed)
Initial Nutrition Assessment  DOCUMENTATION CODES:  Not applicable  INTERVENTION:   (RD to follow for diet advancement, supplement diet as appropriate)  NUTRITION DIAGNOSIS:  Inadequate oral intake related to inability to eat as evidenced by NPO status.   GOAL:  Patient will meet greater than or equal to 90% of their needs   MONITOR:  PO intake, Supplement acceptance, Diet advancement, Weight trends, Labs, I & O's, Skin  REASON FOR ASSESSMENT:  Consult Wound healing  ASSESSMENT: 74 year old male with history of diabetes mellitus, peripheral vascular disease, coronary artery disease, hypertension, COPD presented with worsening ulceration of his left third toe and fifth toe. This all started back on 02/03/2015 when the patient was seen by podiatry and started on doxycycline for erythema about his right third toe.  Pt out of room for procedure at time of visit. Nutrition-focused physical exam unable to be completed at this time.    Wt hx reveals wt stability within the past year.  [facility] note reviewed from 02/19/15. Podiatry and vascular surgery following. Pt with chronic lt toe DM ulcers. Pt for Abdominal Aortogram today.   Labs reviewed: Hgb A1c: 10.9 (from 01/04/15). Pt may benefit from DM education prior to discharge.    Height:  Ht Readings from Last 1 Encounters:  02/18/15 5\' 6"  (1.676 m)    Weight:  Wt Readings from Last 1 Encounters:  02/19/15 124 lb 1.9 oz (56.3 kg)    Ideal Body Weight:  64.5 kg  Wt Readings from Last 10 Encounters:  02/19/15 124 lb 1.9 oz (56.3 kg)  01/02/15 125 lb 9.6 oz (56.972 kg)  11/27/14 130 lb (58.968 kg)  10/08/14 125 lb (56.7 kg)  09/14/14 123 lb (55.792 kg)  08/29/14 122 lb 12.8 oz (55.702 kg)  07/12/14 123 lb 9.6 oz (56.065 kg)  07/10/14 120 lb (54.432 kg)  07/06/14 121 lb (54.885 kg)  06/14/14 122 lb 12.8 oz (55.702 kg)    BMI:  Body mass index is 20.04 kg/(m^2).  Estimated Nutritional Needs:  Kcal:   1700-1900  Protein:  75-85 grams  Fluid:  1.7-1.9 L  Skin:  Wound (see comment) (Dm ulcers on lt 3rd and 5th toes, cellulitis, on lt elbow and lt leg)  Diet Order:  Diet NPO time specified Except for: Sips with Meds  EDUCATION NEEDS:  No education needs identified at this time   Intake/Output Summary (Last 24 hours) at 02/19/15 1704 Last data filed at 02/19/15 0847  Gross per 24 hour  Intake 478.33 ml  Output   2100 ml  Net -1621.67 ml    Last BM:  02/17/15  Hallis Meditz A. Mayford Knife, RD, LDN, CDE Pager: [phone] After hours Pager: [phone]

## 2015-02-19 NOTE — Progress Notes (Signed)
Patient states that his granddaughter lives with him. He denies ever having home health. He denies needing any assistance with obtaining or paying for medication. He states he does sometimes get off balance and has installed a grab bar in his shower but denies any other needs with walking equipment. Patient ambulatory, and able to perform ADLs. Will continue to follow.

## 2015-02-19 NOTE — H&P (View-Only) (Signed)
[facility] [place] HISTORY AND PHYSICAL   History of Present Illness:  Patient is a 74 y.o. year old male who presents for evaluation of non healing wound left foot.  Pt developed ulcer on his left foot approximately 3 weeks ago.  This was followed by Dr Wagner, podiatry but has slowly progressed.  Pt came to ER last night due to increased drainage from foot and skin changes and Dr Wagner wanted him to get IV antibiotics.  Recent non invasive arterial exam but results unavailable for review this morning .  Other medical problems include diabetes, former tobacco abuse, CAD, lumbar stenosis.  All currently stable.  He denies fever chills nausea or vomiting.  Past Medical History  Diagnosis Date  . CAD (coronary artery disease)     LAD 40-50% stenosis, first diagonal small 90% stenosis, circumflex 70% stenosis, OM 80% stenosis, right coronary artery 80-90% stenosis.  . Right upper lobe pneumonia 11/28/2008  . Anxiety 07/18/2008  . Irritable bowel syndrome 04/27/2008  . COPD (chronic obstructive pulmonary disease) 02/27/2008  . B12 deficiency 02/16/2008    in 5/09: B12 262, MMA 1040  . Chronic diarrhea 01/01/2008    s/p EGD 10/06: H pylori + gastritis, duodenal biopsy normal (Dr. Hung); s/p colonoscopy 10/06: hemorrhoids (Dr. Hung); evaluated by Dr. John Perry  in 8/09: tissue transglutaminase AB negative, VIP normal, stool fat content normal, urine 5-HIAA normal; normal GES 11/09 (done for "fluctuating sugars")  . 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 Orthostatic hypotension 02/09/2007  . Hypertension 02/09/2007  . Spinal stenosis in cervical region 05/03    MRI  . Erectile dysfunction 09/27/2006    s/p penile implant  . Anemia, iron deficiency 09/27/2006    neg colonoscopy 2006 - Dr. Hung; ferritin 152; hgb  15.7  on 07/07  . Hypothyroidism 09/27/2006    TSH 2.639  07/07  . Hypersomnia 09/27/2006    evaluated by Dr. Clinton Young (5/08) and  Dr. Dohmeier; PSG 8/09: chronic delayed sleep phase syndrome (patient sleeps during the day and is awake at night), nocturnal myoclonus (eval for RLS and IDA suggested). Pt advised to change sleeping behavior  . Diabetes mellitus type II 09/07/1984    poorly controlled, complicated by peripheral neuropathy, microalbuminuria, mild non proliferative retinopathy, s/p DKA 7/05, on insulin pump x 4/09, started a pump vacation on 11/19/2008  . Hyperlipidemia   . 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 Hemorrhoids   . Chronic kidney disease   . Unspecified hereditary and idiopathic peripheral neuropathy   . Other chronic pain   . Other malaise and fatigue   . Depression   . Hypertrophy of prostate without urinary obstruction and other lower urinary tract symptoms (LUTS)   . Lumbago   . Heart attack 03/2014    mild    Past Surgical History  Procedure Laterality Date  . Penile prosthesis implant    . Tonsillectomy    . Colonoscopy  06/25/2005    Dr. Patrick Hung  . Left heart catheterization with coronary angiogram [name] 03/13/2014    Procedure: LEFT HEART CATHETERIZATION WITH CORONARY ANGIOGRAM;  Surgeon: Peter M Jordan, MD;  Location: [facility] CATH [facility];  Service: Cardiovascular;  Laterality: [name];    Social History History  Substance Use Topics  . Smoking status: Former Smoker    Types: Cigarettes    Quit date: 09/28/2004  . Smokeless tobacco: Never Used  . Alcohol Use: 0.0 oz/week    0 Standard drinks or equivalent per week       Comment: occ/social (moderate)    Family History Family History  Problem Relation Age of Onset  . Bone cancer Mother   . Breast cancer Mother   . Cancer Mother   . Lung cancer Father   . Cancer Father   . Breast cancer Sister   . Cancer Sister   . Lung cancer Brother   . Cancer Sister     type unknown    Allergies  Allergies  Allergen Reactions  . Lipitor [Atorvastatin] Other (See Comments)    Caused pain all over body.  . Ace Inhibitors Other (See Comments)     dizzy  . Angiotensin Receptor Blockers Other (See Comments)    unknown  . Bee Venom Swelling  . Cymbalta [Duloxetine Hcl] Other (See Comments)    dizzy     Current Facility-Administered Medications  Medication Dose Route Frequency Provider Last Rate Last Dose  . acetaminophen (TYLENOL) tablet 650 mg  650 mg Oral Q6H PRN Pranav M Patel, MD       Or  . acetaminophen (TYLENOL) suppository 650 mg  650 mg Rectal Q6H PRN Pranav M Patel, MD      . dextrose 5 %-0.9 % sodium chloride infusion   Intravenous Continuous Katherine P Schorr, NP      . dextrose 50 % solution 12.5 g  12.5 g Intravenous Once David Tat, MD   12.5 g at 02/19/15 0622  . folic acid (FOLVITE) tablet 1 mg  1 mg Oral Daily Pranav M Patel, MD      . heparin injection 5,000 Units  5,000 Units Subcutaneous 3 times per day Pranav M Patel, MD   5,000 Units at 02/19/15 0536  . insulin aspart (novoLOG) injection 0-15 Units  0-15 Units Subcutaneous Q6H Pranav M Patel, MD   3 Units at 02/19/15 0114  . insulin glargine (LANTUS) injection 11 Units  11 Units Subcutaneous QHS Pranav M Patel, MD   11 Units at 02/18/15 2324  . LORazepam (ATIVAN) tablet 1 mg  1 mg Oral Q6H PRN Pranav M Patel, MD       Or  . LORazepam (ATIVAN) injection 1 mg  1 mg Intravenous Q6H PRN Pranav M Patel, MD      . metoprolol tartrate (LOPRESSOR) tablet 12.5 mg  12.5 mg Oral BID Pranav M Patel, MD   12.5 mg at 02/19/15 0113  . multivitamin with minerals tablet 1 tablet  1 tablet Oral Daily Pranav M Patel, MD      . ondansetron (ZOFRAN) tablet 4 mg  4 mg Oral Q6H PRN Pranav M Patel, MD       Or  . ondansetron (ZOFRAN) injection 4 mg  4 mg Intravenous Q6H PRN Pranav M Patel, MD      . piperacillin-tazobactam (ZOSYN) IVPB 3.375 g  3.375 g Intravenous Q8H Corey M Ball, RPH   3.375 g at 02/19/15 0536  . sodium chloride 0.9 % injection 3 mL  3 mL Intravenous Q12H Pranav M Patel, MD   3 mL at 02/18/15 2215  . thiamine (VITAMIN B-1) tablet 100 mg  100 mg Oral Daily Pranav M  Patel, MD       Or  . thiamine (B-1) injection 100 mg  100 mg Intravenous Daily Pranav M Patel, MD      . vancomycin (VANCOCIN) IVPB 750 mg/150 ml premix  750 mg Intravenous Q24H Corey M Ball, RPH        ROS:   General:  No weight loss, Fever, chills    HEENT: No recent headaches, no nasal bleeding, no visual changes, no sore throat  Neurologic: No dizziness, blackouts, seizures. No recent symptoms of stroke or mini- stroke. No recent episodes of slurred speech, or temporary blindness.  Cardiac: No recent episodes of chest pain/pressure, no shortness of breath at rest.  + shortness of breath with exertion.  Denies history of atrial fibrillation or irregular heartbeat  Vascular: No history of rest pain in feet.  No history of claudication.  + history of non-healing ulcer, No history of DVT   Pulmonary: No home oxygen, no productive cough, no hemoptysis,  No asthma or wheezing  Musculoskeletal:  [x ] Arthritis, [x ] Low back pain,  [x ] Joint pain  Hematologic:No history of hypercoagulable state.  No history of easy bleeding.  No history of anemia  Gastrointestinal: No hematochezia or melena,  No gastroesophageal reflux, no trouble swallowing  Urinary: [ ] chronic Kidney disease, [ ] on HD - [ ] MWF or [ ] TTHS, [ ] Burning with urination, [ ] Frequent urination, [ ] Difficulty urinating;   Skin: No rashes  Psychological: No history of anxiety,  No history of depression   Physical Examination  Filed Vitals:   02/18/15 2153 02/19/15 0007 02/19/15 0220 02/19/15 0600  BP: 154/88 188/81 159/76 161/67  Pulse: 66 74 62 60  Temp: 97.8 F (36.6 C) 97.7 F (36.5 C)  97.7 F (36.5 C)  TempSrc: Oral Oral  Oral  Resp: 18 20  20  Height: 5' 6" (1.676 m)     Weight: 123 lb 14.4 oz (56.2 kg)   124 lb 1.9 oz (56.3 kg)  SpO2: 100% 98%  98%    Body mass index is 20.04 kg/(m^2).  General:  Alert and oriented, no acute distress HEENT: Normal Neck: No  JVD Pulmonary: Clear to  auscultation bilaterally Cardiac: Regular Rate and Rhythm  Abdomen: Soft, non-tender, non-distended, no mass Skin: No rash, toe 3 left foot deep crack on plantar aspect most likely extending to bone, early gangrene left 5th toe Extremity Pulses:  2+ radial, brachial, femoral,1-2 + left popliteal pulse, absent dorsalis pedis, posterior tibial pulses bilaterally Musculoskeletal: No deformity or edema  Neurologic: Upper and lower extremity motor 5/5 and symmetric  DATA:   CBC    Component Value Date/Time   WBC 8.9 02/18/2015 1747   WBC 6.0 01/01/2015 1421   WBC 5.3 11/29/2013 1507   RBC 3.33* 02/18/2015 1747   RBC 3.40* 01/01/2015 1421   RBC 3.37* 11/29/2013 1507   RBC 3.02* 11/26/2008 0727   HGB 10.5* 02/18/2015 1747   HGB 11.2* 11/29/2013 1507   HCT 31.3* 02/18/2015 1747   HCT 32.8* 01/01/2015 1421   HCT 34.4* 11/29/2013 1507   PLT 262 02/18/2015 1747   PLT 215 11/29/2013 1507   MCV 94.0 02/18/2015 1747   MCV 102.1* 11/29/2013 1507   MCH 31.5 02/18/2015 1747   MCH 30.9 01/01/2015 1421   MCH 33.2 11/29/2013 1507   MCHC 33.5 02/18/2015 1747   MCHC 32.0 01/01/2015 1421   MCHC 32.5 11/29/2013 1507   RDW 13.8 02/18/2015 1747   RDW 14.3 01/01/2015 1421   RDW 13.4 11/29/2013 1507   LYMPHSABS 0.9 02/18/2015 1747   LYMPHSABS 1.0 01/01/2015 1421   LYMPHSABS 0.9 11/29/2013 1507   MONOABS 1.0 02/18/2015 1747   MONOABS 0.5 11/29/2013 1507   EOSABS 0.2 02/18/2015 1747   EOSABS 0.3 11/29/2013 1507   BASOSABS 0.0 02/18/2015 1747   BASOSABS 0.0 01/01/2015 1421     BASOSABS 0.0 11/29/2013 1507    BMET    Component Value Date/Time   NA 138 02/18/2015 1748   NA 137 01/01/2015 1421   NA 143 11/29/2013 1507   K 5.2* 02/18/2015 1748   K 5.0 11/29/2013 1507   CL 101 02/18/2015 1748   CL 108* 11/07/2012 1507   CO2 29 02/18/2015 1748   CO2 26 11/29/2013 1507   GLUCOSE 257* 02/18/2015 1748   GLUCOSE 368* 01/01/2015 1421   GLUCOSE 156* 11/29/2013 1507   GLUCOSE 256* 11/07/2012 1507    BUN 24* 02/18/2015 1748   BUN 20 01/01/2015 1421   BUN 20.9 11/29/2013 1507   CREATININE 1.43* 02/18/2015 1748   CREATININE 1.1 11/29/2013 1507   CALCIUM 9.0 02/18/2015 1748   CALCIUM 9.3 11/29/2013 1507   GFRNONAA 47* 02/18/2015 1748   GFRAA 55* 02/18/2015 1748    ASSESSMENT:  Left foot with most likely 2 non salvageable toes, at risk of limb loss   PLAN:  1.  Will try to get non invasive results later today 2.  Repeat ABI  3. Agram possible intervention later today by my partner Dr Brabham, keep NPO  4. Continue antibiotics.  Charles Fields, MD [facility] [place] Office: [phone] Pager: [phone]   

## 2015-02-19 NOTE — Progress Notes (Signed)
Pt is a high fall risk with history of multiple fall in the past; supposed to be on bed alarm. However, pt refused to be on bed alarm, stating "I can get up on my own and don't want to be on bed alarm". Pt informed and agreed that he needed to call staff for assistance if he wanted to get up.

## 2015-02-19 NOTE — Progress Notes (Addendum)
Hypoglycemic Event  CBG: 29  @ 0608  Treatment: D50 IV 25 mL  Symptoms: None  Follow-up CBG: Time: 0630 CBG Result:127  Possible Reasons for Event: Unknown  Comments/MD notified:Schorr MD notified.    Kendall Flack  Remember to initiate Hypoglycemia Order Set & complete

## 2015-02-19 NOTE — Op Note (Signed)
Patient name: BENCE TRAPP MRN: [id number] DOB: 05-23-1941 Sex: male  02/18/2015 - 02/19/2015 Pre-operative Diagnosis: Left foot ulcer Post-operative diagnosis:  Same Surgeon:  Annamarie Major Procedure Performed:  1.  Ultrasound-guided access, right femoral artery  2.  Abdominal aortogram  3.  Left lower extremity runoff  4.  Third order catheterization  5.  Atherectomy, left popliteal artery  6.  Drug coated balloon angioplasty, left popliteal artery   Findings: Near occlusive, greater than 95% lesion within the popliteal artery on the left behind the joint space.  This was successfully treated with atherectomy using a CSI 2.0 device and drug coated balloon angioplasty using a 5 x 40 drug coated Lutonix balloon with residual stenosis less than 5%  Indications:  The patient has a nonhealing left toe wound.  Vascular lab studies showed decreased blood flow he is here for further evaluation and possible intervention.  Procedure:  The patient was identified in the holding area and taken to room 8.  The patient was then placed supine on the table and prepped and draped in the usual sterile fashion.  A time out was called.  Ultrasound was used to evaluate the right common femoral artery.  It was patent .  A digital ultrasound image was acquired.  A micropuncture needle was used to access the right common femoral artery under ultrasound guidance.  An 018 wire was advanced without resistance and a micropuncture sheath was placed.  The 018 wire was removed and a benson wire was placed.  The micropuncture sheath was exchanged for a 5 french sheath.  An omniflush catheter was advanced over the wire to the level of L-1.  An abdominal angiogram was obtained.  Next, using the omniflush catheter and a benson wire, the aortic bifurcation was crossed and the catheter was placed into theleft external iliac artery and left runoff was obtained.    Findings:   Aortogram:  No significant renal artery  stenosis is identified.  The infrarenal abdominal aorta is widely patent.  No significant common or external iliac artery stenosis bilaterally  Right Lower Extremity:  Not evaluated  Left Lower Extremity:  The left common femoral profunda femoral artery are widely patent.  There is mild calcification without significant stenosis within the superficial femoral artery at the level of the adductor canal.  There is a high-grade/near occlusive lesion in the popliteal artery at the level of the joint space.  This is a short segment lesion.  The popliteal artery is patent below the lesion.  There is three-vessel runoff to the foot with the anterior tibial and posterior tibial artery forming the plantar arch.  There is a lesion in the tibioperoneal trunk.  Intervention:  After the above images were acquired, the decision was made to proceed with intervention.  A 7 French 45 cm sheath was advanced into the proximal left superficial femoral artery.  The patient was fully heparinized.  I used a 035 Glidewire and a quick cross catheter to successfully cross the lesion in the popliteal artery.  I did have some difficulty navigating through the adductor canal.  A contrast injection was performed with the catheter in the below knee popliteal artery, confirming successful crossing of the lesion.  A Viper was then placed and atherectomy using a CSI 2.0 classic device was performed at low medium and high speed.  I then selected a 5 x 30 Cordis angioplasty balloon and performed angioplasty at nominal pressure for 90 seconds.  I then removed this balloon  and inserted a Lutonix 5 x 40 drug coated balloon.  This was inflated, taking the balloon to 8 atm for 3 minutes.  Completion imaging revealed no residual stenosis, and the runoff was unchanged.  I then reevaluated the adductor canal to see if there was a dissection in this area or a hemodynamically significant stenosis.  Neither appeared present.  Therefore catheters and wires  were removed, and the patient was taken the holding area for sheath pull.  Impression:  #1  near occlusive lesion within the popliteal artery behind the joint space on the left.  This was successfully treated with atherectomy and drug coated balloon angioplasty using a CSI 2.0 classic device and a 5 x 40 Lutonix drug coated balloon  #2  no significant aortoiliac occlusive disease  #3  calcified tibial vessels, however all 3 vessels are patent.  There is mild stenosis within the tibioperoneal trunk     V. Annamarie Major, M.D. [facility] [place] Office: [phone] Pager:  [phone]

## 2015-02-19 NOTE — Interval H&P Note (Signed)
History and Physical Interval Note:  02/19/2015 3:38 PM  Benjamin Hodge  has presented today for surgery, with the diagnosis of pad  The various methods of treatment have been discussed with the patient and family. After consideration of risks, benefits and other options for treatment, the patient has consented to  Procedure(s): Abdominal Aortogram ([name]) as a surgical intervention .  The patient's history has been reviewed, patient examined, no change in status, stable for surgery.  I have reviewed the patient's chart and labs.  Questions were answered to the patient's satisfaction.     Durene Cal

## 2015-02-19 NOTE — Telephone Encounter (Signed)
Patient was sent to the hospital and has a follow up appt with Dr Allyson Sabal

## 2015-02-20 ENCOUNTER — Encounter (HOSPITAL_COMMUNITY): Payer: Self-pay | Admitting: Surgery

## 2015-02-20 DIAGNOSIS — I739 Peripheral vascular disease, unspecified: Secondary | ICD-10-CM

## 2015-02-20 DIAGNOSIS — I2510 Atherosclerotic heart disease of native coronary artery without angina pectoris: Secondary | ICD-10-CM

## 2015-02-20 DIAGNOSIS — G990 Autonomic neuropathy in diseases classified elsewhere: Secondary | ICD-10-CM

## 2015-02-20 DIAGNOSIS — L03032 Cellulitis of left toe: Secondary | ICD-10-CM

## 2015-02-20 DIAGNOSIS — E1042 Type 1 diabetes mellitus with diabetic polyneuropathy: Secondary | ICD-10-CM

## 2015-02-20 LAB — CBC
HCT: 30.3 % — ABNORMAL LOW (ref 39.0–52.0)
Hemoglobin: 10.1 g/dL — ABNORMAL LOW (ref 13.0–17.0)
MCH: 30.9 pg (ref 26.0–34.0)
MCHC: 33.3 g/dL (ref 30.0–36.0)
MCV: 92.7 fL (ref 78.0–100.0)
PLATELETS: 228 10*3/uL (ref 150–400)
RBC: 3.27 MIL/uL — AB (ref 4.22–5.81)
RDW: 13.6 % (ref 11.5–15.5)
WBC: 6.6 10*3/uL (ref 4.0–10.5)

## 2015-02-20 LAB — GLUCOSE, CAPILLARY
GLUCOSE-CAPILLARY: 315 mg/dL — AB (ref 65–99)
GLUCOSE-CAPILLARY: 160 mg/dL — AB (ref 65–99)
Glucose-Capillary: 97 mg/dL (ref 65–99)
Glucose-Capillary: 377 mg/dL — ABNORMAL HIGH (ref 65–99)

## 2015-02-20 LAB — BASIC METABOLIC PANEL
ANION GAP: 7 (ref 5–15)
BUN: 13 mg/dL (ref 6–20)
CHLORIDE: 104 mmol/L (ref 101–111)
CO2: 28 mmol/L (ref 22–32)
CREATININE: 1.31 mg/dL — AB (ref 0.61–1.24)
Calcium: 8.6 mg/dL — ABNORMAL LOW (ref 8.9–10.3)
GFR calc non Af Amer: 53 mL/min — ABNORMAL LOW (ref >60)
Glucose, Bld: 116 mg/dL — ABNORMAL HIGH (ref 65–99)
POTASSIUM: 3.8 mmol/L (ref 3.5–5.1)
SODIUM: 139 mmol/L (ref 135–145)

## 2015-02-20 LAB — HEMOGLOBIN A1C
Hgb A1c MFr Bld: 9.7 % — ABNORMAL HIGH (ref 4.8–5.6)
Mean Plasma Glucose: 232 mg/dL

## 2015-02-20 MED ORDER — SACCHAROMYCES BOULARDII 250 MG PO CAPS
250.0000 mg | ORAL_CAPSULE | Freq: Two times a day (BID) | ORAL | Status: DC
  Administered 2015-02-20 – 2015-02-22 (×5): 250 mg via ORAL
  Filled 2015-02-20 (×6): qty 1

## 2015-02-20 MED ORDER — INSULIN ASPART 100 UNIT/ML ~~LOC~~ SOLN
0.0000 [IU] | Freq: Three times a day (TID) | SUBCUTANEOUS | Status: DC
  Administered 2015-02-20: 13:00:00 3 [IU] via SUBCUTANEOUS
  Administered 2015-02-20: 11 [IU] via SUBCUTANEOUS
  Administered 2015-02-21: 19:00:00 6 [IU] via SUBCUTANEOUS
  Administered 2015-02-22: 07:00:00 3 [IU] via SUBCUTANEOUS

## 2015-02-20 MED ORDER — DIPHENHYDRAMINE HCL 50 MG/ML IJ SOLN
25.0000 mg | Freq: Once | INTRAMUSCULAR | Status: AC
  Administered 2015-02-20: 25 mg via INTRAVENOUS
  Filled 2015-02-20: qty 1

## 2015-02-20 MED ORDER — INSULIN ASPART 100 UNIT/ML ~~LOC~~ SOLN
4.0000 [IU] | Freq: Once | SUBCUTANEOUS | Status: AC
  Administered 2015-02-20: 23:00:00 4 [IU] via SUBCUTANEOUS

## 2015-02-20 NOTE — Progress Notes (Signed)
Writer of this note, called RRT to checked on pt prior to transporting/transferring pt to unit for appropriateness. We will wait RRT notification.

## 2015-02-20 NOTE — Progress Notes (Signed)
Site area: right groin  Site Prior to Removal:  Level 0  Pressure Applied For 20 MINUTES    Minutes Beginning at 23:13  Manual:   Yes.    Patient Status During Pull:  Pt feel nauseous in between sheath pull. Was given Zofran IV.  Post Pull Groin Site:  Level 0  Post Pull Instructions Given:  Yes.    Post Pull Pulses Present:  Yes.    Dressing Applied:  Yes.    Comments:

## 2015-02-20 NOTE — Progress Notes (Signed)
Progress Note    02/20/2015 8:13 AM 1 Day Post-Op  Subjective:  No complaints; wants to go home  afebrile  Filed Vitals:   02/20/15 0414  BP: 117/61  Pulse: 63  Temp: 97.5 F (36.4 C)  Resp: 10    Physical Exam: Extremities:  Left foot is warm; 2+ palpable left popliteal pulse; left 5th toe discolored to just above the base of the toe with necrotic area laterally ; 3rd toe with plantar sore and peeling; right groin is soft without hematoma   CBC    Component Value Date/Time   WBC 8.9 02/18/2015 1747   WBC 6.0 01/01/2015 1421   WBC 5.3 11/29/2013 1507   RBC 3.33* 02/18/2015 1747   RBC 3.40* 01/01/2015 1421   RBC 3.37* 11/29/2013 1507   RBC 3.02* 11/26/2008 0727   HGB 10.5* 02/18/2015 1747   HGB 11.2* 11/29/2013 1507   HCT 31.3* 02/18/2015 1747   HCT 32.8* 01/01/2015 1421   HCT 34.4* 11/29/2013 1507   PLT 262 02/18/2015 1747   PLT 215 11/29/2013 1507   MCV 94.0 02/18/2015 1747   MCV 102.1* 11/29/2013 1507   MCH 31.5 02/18/2015 1747   MCH 30.9 01/01/2015 1421   MCH 33.2 11/29/2013 1507   MCHC 33.5 02/18/2015 1747   MCHC 32.0 01/01/2015 1421   MCHC 32.5 11/29/2013 1507   RDW 13.8 02/18/2015 1747   RDW 14.3 01/01/2015 1421   RDW 13.4 11/29/2013 1507   LYMPHSABS 0.9 02/18/2015 1747   LYMPHSABS 1.0 01/01/2015 1421   LYMPHSABS 0.9 11/29/2013 1507   MONOABS 1.0 02/18/2015 1747   MONOABS 0.5 11/29/2013 1507   EOSABS 0.2 02/18/2015 1747   EOSABS 0.3 11/29/2013 1507   BASOSABS 0.0 02/18/2015 1747   BASOSABS 0.0 01/01/2015 1421   BASOSABS 0.0 11/29/2013 1507    BMET    Component Value Date/Time   NA 138 02/18/2015 1748   NA 137 01/01/2015 1421   NA 143 11/29/2013 1507   K 5.2* 02/18/2015 1748   K 5.0 11/29/2013 1507   CL 101 02/18/2015 1748   CL 108* 11/07/2012 1507   CO2 29 02/18/2015 1748   CO2 26 11/29/2013 1507   GLUCOSE 257* 02/18/2015 1748   GLUCOSE 368* 01/01/2015 1421   GLUCOSE 156* 11/29/2013 1507   GLUCOSE 256* 11/07/2012 1507   BUN 24*  02/18/2015 1748   BUN 20 01/01/2015 1421   BUN 20.9 11/29/2013 1507   CREATININE 1.43* 02/18/2015 1748   CREATININE 1.1 11/29/2013 1507   CALCIUM 9.0 02/18/2015 1748   CALCIUM 9.3 11/29/2013 1507   GFRNONAA 47* 02/18/2015 1748   GFRAA 55* 02/18/2015 1748    INR    Component Value Date/Time   INR 0.91 03/12/2014 1430     Intake/Output Summary (Last 24 hours) at 02/20/15 0813 Last data filed at 02/20/15 0417  Gross per 24 hour  Intake    390 ml  Output   2000 ml  Net  -1610 ml     Assessment:  74 y.o. male is s/p:  1. Ultrasound-guided access, right femoral artery 2. Abdominal aortogram 3. Left lower extremity runoff 4. Third order catheterization 5. Atherectomy, left popliteal artery 6. Drug coated balloon angioplasty, left popliteal artery  1 Day Post-Op  Plan: -pt with palpable left popliteal pulse this morning -toes will need to demarcate -pt to have MRI today of left foot -continue Plavix -f/u with Dr. Darrick Penna in 2-4 weeks   Doreatha Massed, PA-C [facility] [phone] 02/20/2015 8:13  AM    I agree with the above Will let toe demarcate F/u with Dr. Darrick Penna in 1 month Continue abx  Durene Cal

## 2015-02-20 NOTE — Progress Notes (Signed)
[facility] PROGRESS NOTE  Benjamin Hodge EHU:[id number] DOB: 1941/04/16 DOA: 02/18/2015  PCP: Estill Dooms, MD  Brief HPI: 74 year old Caucasian male with a past medical history of diabetes, peripheral vascular disease, coronary artery disease, hypertension, COPD, presented with worsening ulceration of his left third and fifth toes. Patient was placed on oral antibiotics as an outpatient. He was being followed by a podiatrist. Because of the worsening wounds patient was referred for hospitalization. He underwent an arterial duplex study which revealed 58-69% reduction in the right mid SFA 70-99% reduction in the left proximal SFA. Vascular surgery was consulted. Patient underwent arteriogram followed by atherectomy of the left popliteal artery and drug-coated balloon angioplasty of the same vessel. This was done on June 14.  Past medical history:  Past Medical History  Diagnosis Date  . CAD (coronary artery disease)     LAD 40-50% stenosis, first diagonal small 90% stenosis, circumflex 70% stenosis, OM 80% stenosis, right coronary artery 80-90% stenosis.  . Right upper lobe pneumonia 11/28/2008  . Anxiety 07/18/2008  . Irritable bowel syndrome 04/27/2008  . COPD (chronic obstructive pulmonary disease) 02/27/2008  . B12 deficiency 02/16/2008    in 5/09: B12 262, MMA 1040  . Chronic diarrhea 01/01/2008    s/p EGD 10/06: H pylori + gastritis, duodenal biopsy normal (Dr. Benson Norway); s/p colonoscopy 10/06: hemorrhoids (Dr. Benson Norway); evaluated by Dr. Scarlette Shorts  in 8/09: tissue transglutaminase AB negative, VIP normal, stool fat content normal, urine 5-HIAA normal; normal GES 11/09 (done for "fluctuating sugars")  . 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 Orthostatic hypotension 02/09/2007  . Hypertension 02/09/2007  . Spinal stenosis in cervical region 05/03    MRI  . Erectile dysfunction 09/27/2006    s/p penile implant  . Anemia, iron deficiency 09/27/2006      neg colonoscopy 2006 - Dr. Benson Norway; ferritin 152; hgb  15.7  on 07/07  . Hypothyroidism 09/27/2006    TSH 2.639  07/07  . Hypersomnia 09/27/2006    evaluated by Dr. Baird Lyons (5/08) and Dr. Brett Fairy; PSG 8/09: chronic delayed sleep phase syndrome (patient sleeps during the day and is awake at night), nocturnal myoclonus (eval for RLS and IDA suggested). Pt advised to change sleeping behavior  . Diabetes mellitus type II 09/07/1984    poorly controlled, complicated by peripheral neuropathy, microalbuminuria, mild non proliferative retinopathy, s/p DKA 7/05, on insulin pump x 4/09, started a pump vacation on 11/19/2008  . Hyperlipidemia   . 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 Hemorrhoids   . Chronic kidney disease   . Unspecified hereditary and idiopathic peripheral neuropathy   . Other chronic pain   . Other malaise and fatigue   . Depression   . Hypertrophy of prostate without urinary obstruction and other lower urinary tract symptoms (LUTS)   . Lumbago   . Heart attack 03/2014    mild    Consultants: Vascular surgery.  Procedures:   1. Ultrasound-guided access, right femoral artery 2. Abdominal aortogram 3. Left lower extremity runoff 4. Third order catheterization 5. Atherectomy, left popliteal artery 6. Drug coated balloon angioplasty, left popliteal artery  Antibiotics: On oral clindamycin and ciprofloxacin Also on vancomycin and Zosyn  Subjective: Patient feels better this morning. Continues to have some pain in his toes, but denies any pain in his lower extremities otherwise. Denies nausea, vomiting. No chest pain or shortness of breath.  Objective: Vital Signs  Filed Vitals:   02/20/15 1000 02/20/15 1030 02/20/15 1100 02/20/15 1130  BP: 120/57  140/44 95/44 116/47  Pulse:      Temp:      TempSrc:      Resp:      Height:      Weight:      SpO2:        Intake/Output Summary (Last 24  hours) at 02/20/15 1304 Last data filed at 02/20/15 0417  Gross per 24 hour  Intake    390 ml  Output   1200 ml  Net   -810 ml   Filed Weights   02/19/15 0600 02/20/15 0011 02/20/15 0500  Weight: 56.3 kg (124 lb 1.9 oz) 56.8 kg (125 lb 3.5 oz) 56.8 kg (125 lb 3.5 oz)    General appearance: alert, cooperative, appears stated age and no distress Head: Normocephalic, without obvious abnormality, atraumatic Resp: clear to auscultation bilaterally Cardio: regular rate and rhythm, S1, S2 normal, no murmur, click, rub or gallop GI: soft, non-tender; bowel sounds normal; no masses,  no organomegaly Extremities: Gangrenous left third and fifth toes. Pulses: Poorly palpable in the lower extremities Neurologic: No focal deficits  Lab Results:  Basic Metabolic Panel:  Recent Labs Lab 02/18/15 1747 02/18/15 1748 02/20/15 0838  NA 137 138 139  K 5.3* 5.2* 3.8  CL 102 101 104  CO2 _0 GLUCOSE 264* 257* 116*  BUN 24* 24* 13  CREATININE 1.35* 1.43* 1.31*  CALCIUM 8.8* 9.0 8.6*   Liver Function Tests:  Recent Labs Lab 02/18/15 1748  AST 29  ALT 22  ALKPHOS 80  BILITOT 0.5  PROT 6.6  ALBUMIN 3.3*   CBC:  Recent Labs Lab 02/18/15 1747 02/20/15 0838  WBC 8.9 6.6  NEUTROABS 6.9  --   HGB 10.5* 10.1*  HCT 31.3* 30.3*  MCV 94.0 92.7  PLT 262 228   CBG:  Recent Labs Lab 02/19/15 1217 02/19/15 1828 02/19/15 2143 02/20/15 0613 02/20/15 1211  GLUCAP 82 129* 170* 315* 160*    Recent Results (from the past 240 hour(s))  Culture, blood (routine x 2)     Status: None (Preliminary result)   Collection Time: 02/18/15  8:31 PM  Result Value Ref Range Status   Specimen Description BLOOD RIGHT ANTECUBITAL  Final   Special Requests BOTTLES DRAWN AEROBIC AND ANAEROBIC 5CC EA  Final   Culture   Final           BLOOD CULTURE RECEIVED NO GROWTH TO DATE CULTURE WILL BE HELD FOR 5 DAYS BEFORE ISSUING A FINAL NEGATIVE REPORT Performed at [facility]    Report  Status PENDING  Incomplete  Culture, blood (routine x 2)     Status: None (Preliminary result)   Collection Time: 02/18/15  8:50 PM  Result Value Ref Range Status   Specimen Description BLOOD LEFT ANTECUBITAL  Final   Special Requests BOTTLES DRAWN AEROBIC AND ANAEROBIC 5CC EA  Final   Culture   Final           BLOOD CULTURE RECEIVED NO GROWTH TO DATE CULTURE WILL BE HELD FOR 5 DAYS BEFORE ISSUING A FINAL NEGATIVE REPORT Performed at [facility]    Report Status PENDING  Incomplete      Studies/Results: No results found.  Medications:  Scheduled: . amLODipine  2.5 mg Oral Daily  . ciprofloxacin  500 mg Oral BID  . clindamycin  300 mg Oral TID  . clopidogrel  75 mg Oral Q breakfast  . dextrose  12.5 g Intravenous Once  . escitalopram  20  mg Oral Daily  . folic acid  1 mg Oral Daily  . heparin  5,000 Units Subcutaneous 3 times per day  . insulin aspart  0-15 Units Subcutaneous TID WC  . insulin glargine  11 Units Subcutaneous QHS  . levothyroxine  75 mcg Oral Daily  . metoprolol tartrate  12.5 mg Oral BID  . metoprolol tartrate  25 mg Oral BID  . multivitamin with minerals  1 tablet Oral Daily  . piperacillin-tazobactam (ZOSYN)  IV  3.375 g Intravenous Q8H  . pravastatin  20 mg Oral QPM  . saccharomyces boulardii  250 mg Oral BID  . sodium chloride  3 mL Intravenous Q12H  . thiamine  100 mg Oral Daily   Or  . thiamine  100 mg Intravenous Daily  . vancomycin  750 mg Intravenous Q24H  . vitamin B-12  1,000 mcg Oral Daily   Continuous: . sodium chloride 75 mL/hr at 02/19/15 2000  . 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 Patient underwent arteriogram with balloon angioplasty. MRI of the left foot is pending to evaluate for osteomyelitis. Per vascular the toes will need to demarcate. At that point, intervention can be performed. Continue IV antibiotics for now. Stop the oral antibiotics. He is in the hospital. Depending on MRI findings further plans will be outlined. ESR 50, CRP <0.5  Peripheral vascular disease status post angioplasty Stable. Vascular surgery following.  History of essential Hypertension  Blood pressure borderline low. She noted to be on 2 different doses of metoprolol. Adjust.  History of CAD with hx of NSTEMI Continue aspirin. Continue Plavix for now--he was discharged with aspirin and Plavix after his admission in July 2015. Heart catheterization 03/13/2014 revealed three-vessel disease--medical therapy was recommended.  History of Diabetes mellitus type 2 with renal complications The patient appears to have poor insight. 01/01/2015 hemoglobin A1c 10.9. Continue reduced dose of long-acting insulin (pt was on Toujeo).  History of COPD  Clinically stable on room air. Patient has greater than 80-pack-year history. Quit smoking 10 years ago   ADHD Continue Ritalin   DVT Prophylaxis: Subcutaneous heparin    Code Status: Full code  Family Communication: Discussed with the patient  Disposition Plan: PT and OT to evaluate. Await MRI of the foot.  Follow-up Appointment?: Will need to follow-up with vascular surgery in 2 weeks.   LOS: 2 days   [facility] Pager [phone] 02/20/2015, 1:04 PM  If 7PM-7AM, please contact night-coverage at  [email or web address], password [facility]

## 2015-02-20 NOTE — Progress Notes (Signed)
Environmental reported patient fall. Arrived in room at 0900 and found patient standing with environmental staff leaning over and holding IV pump. Patient reported he slid down onto his knees while using the IV pump to help steady himself while walking to the bathroom. Reminded he was to call for help if he needs to get out of bed. Patient denied any pain or discomfort. Patient instructed to call for assist and returned to bed with bed alarm on. No injury noted and texted physician to notify of patient fall. Orders for PT and OT ordered by physician. Patient monitored the remainder of shift with no further incident.

## 2015-02-20 NOTE — Progress Notes (Signed)
This note also relates to the following rows which could not be included: ECG Heart Rate - Cannot attach notes to unvalidated device data   

## 2015-02-20 NOTE — Progress Notes (Signed)
CM to f/u with pt after PT's evaluation/ recommendations for [facility] needs and equipment.

## 2015-02-20 NOTE — Progress Notes (Signed)
Called per 3 East RN at [employer] regarding Pt transferring to telemetry. Unable to see Pt immediately and 3 E RN Roliza made aware. Advised RN to request 6C staff to obtain manual BP,  notify MD of low BP this afternoon and await orders and/or interventions. Call received from 6 C RN at 2035 concerning Pt transfer. Per RN PT resting asymptomatic with soft but improving BP. Advised RN to notify MD of low BP this afternoon and await orders and/or interventions and if appropriate to transfer Pt at this time. At bedside 2100 Pt resting in bed, appears comfortable, BP stable. Per Vernona Rieger RN MD not notified of low BP tonight. RN to continue to monitor Pt on 6 C. Advised RN again to update triad provider on call of VS and current status.

## 2015-02-21 DIAGNOSIS — E039 Hypothyroidism, unspecified: Secondary | ICD-10-CM

## 2015-02-21 LAB — BASIC METABOLIC PANEL
ANION GAP: 7 (ref 5–15)
Anion gap: 10 (ref 5–15)
BUN: 18 mg/dL (ref 6–20)
BUN: 19 mg/dL (ref 6–20)
CHLORIDE: 103 mmol/L (ref 101–111)
CHLORIDE: 99 mmol/L — AB (ref 101–111)
CO2: 27 mmol/L (ref 22–32)
CO2: 27 mmol/L (ref 22–32)
CREATININE: 1.71 mg/dL — AB (ref 0.61–1.24)
Calcium: 8.5 mg/dL — ABNORMAL LOW (ref 8.9–10.3)
Calcium: 8.2 mg/dL — ABNORMAL LOW (ref 8.9–10.3)
Creatinine, Ser: 1.69 mg/dL — ABNORMAL HIGH (ref 0.61–1.24)
GFR calc Af Amer: 45 mL/min — ABNORMAL LOW (ref >60)
GFR calc Af Amer: 44 mL/min — ABNORMAL LOW (ref >60)
GFR calc non Af Amer: 39 mL/min — ABNORMAL LOW (ref >60)
GFR calc non Af Amer: 38 mL/min — ABNORMAL LOW (ref >60)
GLUCOSE: 139 mg/dL — AB (ref 65–99)
Glucose, Bld: 348 mg/dL — ABNORMAL HIGH (ref 65–99)
Potassium: 4.2 mmol/L (ref 3.5–5.1)
Potassium: 4 mmol/L (ref 3.5–5.1)
SODIUM: 137 mmol/L (ref 135–145)
Sodium: 136 mmol/L (ref 135–145)

## 2015-02-21 LAB — GLUCOSE, CAPILLARY
GLUCOSE-CAPILLARY: 336 mg/dL — AB (ref 65–99)
Glucose-Capillary: 28 mg/dL — CL (ref 65–99)
Glucose-Capillary: 103 mg/dL — ABNORMAL HIGH (ref 65–99)
Glucose-Capillary: 471 mg/dL — ABNORMAL HIGH (ref 65–99)
Glucose-Capillary: 402 mg/dL — ABNORMAL HIGH (ref 65–99)
Glucose-Capillary: 160 mg/dL — ABNORMAL HIGH (ref 65–99)

## 2015-02-21 LAB — CBC
HCT: 27.8 % — ABNORMAL LOW (ref 39.0–52.0)
HEMOGLOBIN: 9.6 g/dL — AB (ref 13.0–17.0)
MCH: 31.9 pg (ref 26.0–34.0)
MCHC: 34.5 g/dL (ref 30.0–36.0)
MCV: 92.4 fL (ref 78.0–100.0)
Platelets: 218 10*3/uL (ref 150–400)
RBC: 3.01 MIL/uL — ABNORMAL LOW (ref 4.22–5.81)
RDW: 13.7 % (ref 11.5–15.5)
WBC: 7.7 10*3/uL (ref 4.0–10.5)

## 2015-02-21 LAB — CLOSTRIDIUM DIFFICILE BY PCR: CDIFFPCR: NEGATIVE

## 2015-02-21 MED ORDER — SODIUM CHLORIDE 0.9 % IV SOLN
INTRAVENOUS | Status: DC
  Administered 2015-02-21 (×2): via INTRAVENOUS

## 2015-02-21 MED ORDER — DIPHENOXYLATE-ATROPINE 2.5-0.025 MG PO TABS
1.0000 | ORAL_TABLET | Freq: Four times a day (QID) | ORAL | Status: DC | PRN
  Administered 2015-02-21 – 2015-02-22 (×3): 1 via ORAL
  Filled 2015-02-21 (×3): qty 1

## 2015-02-21 MED ORDER — INSULIN ASPART 100 UNIT/ML ~~LOC~~ SOLN
6.0000 [IU] | Freq: Once | SUBCUTANEOUS | Status: AC
  Administered 2015-02-21: 22:00:00 1 [IU] via SUBCUTANEOUS

## 2015-02-21 MED ORDER — INSULIN ASPART 100 UNIT/ML ~~LOC~~ SOLN: 20.0000 [IU] | Freq: Once | SUBCUTANEOUS | Status: DC

## 2015-02-21 MED ORDER — SODIUM CHLORIDE 0.9 % IV BOLUS (SEPSIS): 500.0000 mL | Freq: Once | INTRAVENOUS | Status: DC

## 2015-02-21 MED ORDER — HYDROCODONE-ACETAMINOPHEN 5-325 MG PO TABS
1.0000 | ORAL_TABLET | Freq: Four times a day (QID) | ORAL | Status: DC | PRN
  Administered 2015-02-21 – 2015-02-22 (×3): 1 via ORAL
  Filled 2015-02-21 (×4): qty 1

## 2015-02-21 MED ORDER — SODIUM CHLORIDE 0.9 % IV SOLN: INTRAVENOUS | Status: DC

## 2015-02-21 NOTE — Progress Notes (Signed)
Patients blood sugar 471, notified Dr. Barnie Del and orders received for 20 units to be given. Patient refused insulin and after discussion agreed to take only 6 units and to have cbg rechecked in one hour. Notified Dr. Barnie Del by text and reported to Toney Sang RN (on coming nurse for night shift).

## 2015-02-21 NOTE — Progress Notes (Signed)
ANTIBIOTIC CONSULT NOTE - FOLLOW UP  Pharmacy Consult for Vancomycin and Zosyn Indication: cellulitis  Allergies  Allergen Reactions  . Lipitor [Atorvastatin] Other (See Comments)    Caused pain all over body.  . Ace Inhibitors Other (See Comments)    dizzy  . Angiotensin Receptor Blockers Other (See Comments)    unknown  . Bee Venom Swelling  . Cymbalta [Duloxetine Hcl] Other (See Comments)    dizzy    Patient Measurements: Height: 5' 6"  (167.6 cm) Weight: 124 lb 12.5 oz (56.6 kg) IBW/kg (Calculated) : 63.8 Adjusted Body Weight:   Vital Signs: Temp: 98 F (36.7 C) (06/16 0826) Temp Source: Oral (06/16 0826) BP: 79/48 mmHg (06/16 0826) Pulse Rate: 65 (06/16 0826) Intake/Output from previous day: 06/15 0701 - 06/16 0700 In: -  Out: 400 [Urine:400] Intake/Output from this shift:    Labs:  Recent Labs  02/18/15 1747 02/18/15 1748 02/20/15 0838 02/21/15 0514  WBC 8.9  --  6.6 7.7  HGB 10.5*  --  10.1* 9.6*  PLT 262  --  228 218  CREATININE 1.35* 1.43* 1.31* 1.69*   Estimated Creatinine Clearance: 31.6 mL/min (by C-G formula based on Cr of 1.69). No results for input(s): VANCOTROUGH, VANCOPEAK, VANCORANDOM, GENTTROUGH, GENTPEAK, GENTRANDOM, TOBRATROUGH, TOBRAPEAK, TOBRARND, AMIKACINPEAK, AMIKACINTROU, AMIKACIN in the last 72 hours.   Microbiology: Recent Results (from the past 720 hour(s))  Culture, blood (routine x 2)     Status: None (Preliminary result)   Collection Time: 02/18/15  8:31 PM  Result Value Ref Range Status   Specimen Description BLOOD RIGHT ANTECUBITAL  Final   Special Requests BOTTLES DRAWN AEROBIC AND ANAEROBIC 5CC EA  Final   Culture   Final           BLOOD CULTURE RECEIVED NO GROWTH TO DATE CULTURE WILL BE HELD FOR 5 DAYS BEFORE ISSUING A FINAL NEGATIVE REPORT Performed at [facility]    Report Status PENDING  Incomplete  Culture, blood (routine x 2)     Status: None (Preliminary result)   Collection Time: 02/18/15  8:50 PM   Result Value Ref Range Status   Specimen Description BLOOD LEFT ANTECUBITAL  Final   Special Requests BOTTLES DRAWN AEROBIC AND ANAEROBIC 5CC EA  Final   Culture   Final           BLOOD CULTURE RECEIVED NO GROWTH TO DATE CULTURE WILL BE HELD FOR 5 DAYS BEFORE ISSUING A FINAL NEGATIVE REPORT Performed at [facility]    Report Status PENDING  Incomplete  Clostridium Difficile by PCR (not at [facility])     Status: None   Collection Time: 02/20/15  5:10 PM  Result Value Ref Range Status   C difficile by pcr NEGATIVE NEGATIVE Final    Medical History: Past Medical History  Diagnosis Date  . CAD (coronary artery disease)     LAD 40-50% stenosis, first diagonal small 90% stenosis, circumflex 70% stenosis, OM 80% stenosis, right coronary artery 80-90% stenosis.  . Right upper lobe pneumonia 11/28/2008  . Anxiety 07/18/2008  . Irritable bowel syndrome 04/27/2008  . COPD (chronic obstructive pulmonary disease) 02/27/2008  . B12 deficiency 02/16/2008    in 5/09: B12 262, MMA 1040  . Chronic diarrhea 01/01/2008    s/p EGD 10/06: H pylori + gastritis, duodenal biopsy normal (Dr. Benson Norway); s/p colonoscopy 10/06: hemorrhoids (Dr. Benson Norway); evaluated by Dr. Scarlette Shorts  in 8/09: tissue transglutaminase AB negative, VIP normal, stool fat content normal, urine 5-HIAA normal; normal GES  11/09 (done for "fluctuating sugars")  . 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 Orthostatic hypotension 02/09/2007  . Hypertension 02/09/2007  . Spinal stenosis in cervical region 05/03    MRI  . Erectile dysfunction 09/27/2006    s/p penile implant  . Anemia, iron deficiency 09/27/2006    neg colonoscopy 2006 - Dr. Benson Norway; ferritin 152; hgb  15.7  on 07/07  . Hypothyroidism 09/27/2006    TSH 2.639  07/07  . Hypersomnia 09/27/2006    evaluated by Dr. Baird Lyons (5/08) and Dr. Brett Fairy; PSG 8/09: chronic delayed sleep phase syndrome (patient sleeps during the day and is awake at night),  nocturnal myoclonus (eval for RLS and IDA suggested). Pt advised to change sleeping behavior  . Diabetes mellitus type II 09/07/1984    poorly controlled, complicated by peripheral neuropathy, microalbuminuria, mild non proliferative retinopathy, s/p DKA 7/05, on insulin pump x 4/09, started a pump vacation on 11/19/2008  . Hyperlipidemia   . 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 Hemorrhoids   . Chronic kidney disease   . Unspecified hereditary and idiopathic peripheral neuropathy   . Other chronic pain   . Other malaise and fatigue   . Depression   . Hypertrophy of prostate without urinary obstruction and other lower urinary tract symptoms (LUTS)   . Lumbago   . Heart attack 03/2014    mild    Medications:  Prescriptions prior to admission  Medication Sig Dispense Refill Last Dose  . amLODipine (NORVASC) 2.5 MG tablet Take 2.5 mg by mouth daily.   02/18/2015 at Unknown time  . aspirin 81 MG tablet Take 81 mg by mouth daily.    02/18/2015 at Unknown time  . ciprofloxacin (CIPRO) 500 MG tablet Take 1 tablet (500 mg total) by mouth 2 (two) times daily. 20 tablet 0 02/18/2015 at Unknown time  . clindamycin (CLEOCIN) 300 MG capsule Take 1 capsule (300 mg total) by mouth 3 (three) times daily. 30 capsule 2 02/18/2015 at Unknown time  . clopidogrel (PLAVIX) 75 MG tablet TAKE 1 TABLET BY MOUTH DAILY WITH BREAKFAST 30 tablet 3 02/18/2015 at Unknown time  . Continuous Glucose Monitor (DEXCOM G4 PLATINUM RECEIVER) KIT USE AS DIRECTED 1 kit 1 unknown  . diphenoxylate-atropine (LOMOTIL) 2.5-0.025 MG per tablet TAKE ONE OR TWO TABLETS BY MOUTH TWICE DAILY AS NEEDED  (Patient taking differently: TAKE ONE OR TWO TABLETS BY MOUTH TWICE DAILY AS NEEDED FOR LOOSE STOOLS) 60 tablet 0 1-2 MONTHS AGO  . escitalopram (LEXAPRO) 20 MG tablet TAKE ONE TABLET BY MOUTH ONE TIME DAILY 30 tablet 5 02/18/2015 at Unknown time  . gabapentin (NEURONTIN) 600 MG tablet TAKE ONE TABLET BY MOUTH THREE TIMES DAILY  (Patient taking  differently: Take 1/2 tablet daily) 90 tablet 1 02/18/2015 at Unknown time  . HYDROcodone-acetaminophen (NORCO/VICODIN) 5-325 MG per tablet Take one tablet by mouth five times daily as needed for pain 150 tablet 0 02/18/2015 at Unknown time  . Insulin Glargine (TOUJEO SOLOSTAR) 300 UNIT/ML SOPN Inject 11 Units into the skin at bedtime.    02/17/2015 at Unknown time  . insulin lispro (HUMALOG) 100 UNIT/ML KiwkPen Inject 1-6 Units into the skin daily as needed (100-199 = 1 units, 200-299 = 2 units, 300-399 = 3 units, 400-499 = 4 units, 500-599 = 5 units, 600+ = 6 units).    02/18/2015 at Unknown time  . levothyroxine (SYNTHROID, LEVOTHROID) 75 MCG tablet TAKE ONE TABLET BY MOUTH ONE TIME DAILY  30 tablet 5 02/18/2015 at Unknown time  .  methylphenidate (RITALIN) 20 MG tablet Take 1 tablet (20 mg total) by mouth 2 (two) times daily. (Patient taking differently: Take 20 mg by mouth daily. ) 180 tablet 0 02/18/2015 at Unknown time  . metoCLOPramide (REGLAN) 5 MG tablet USE ONCE A DAY AFTER LUCHTIME MEAL 90 tablet 1 02/17/2015 at Unknown time  . metoprolol tartrate (LOPRESSOR) 25 MG tablet TAKE ONE TABLET BY MOUTH TWICE DAILY  60 tablet 5 02/18/2015 at 0800  . ONE TOUCH ULTRA TEST test strip TEST 8 TIMES PER DAY FOR 30 DAYS 250 each 4 unknown  . pantoprazole (PROTONIX) 40 MG tablet Take one tablet by mouth once daily for stomach (Patient taking differently: Take 40 mg by mouth daily. ) 30 tablet 5 02/18/2015 at Unknown time  . pravastatin (PRAVACHOL) 20 MG tablet Take 1 tablet (20 mg total) by mouth every evening. 90 tablet 3 02/17/2015 at Unknown time  . pregabalin (LYRICA) 50 MG capsule 1-2 capsules at bed to help neuropathic pain (Patient taking differently: Take 100 mg by mouth at bedtime. ) 60 capsule 5 02/17/2015 at Unknown time  . silver sulfADIAZINE (SILVADENE) 1 % cream Apply 1 application topically daily. 50 g 0 02/18/2015 at Unknown time  . vitamin B-12 (CYANOCOBALAMIN) 1000 MCG tablet Take 1,000 mcg by mouth  daily.   02/18/2015 at Unknown time  . amLODipine (NORVASC) 5 MG tablet Take 1 tablet (5 mg total) by mouth daily. (Patient not taking: Reported on 02/18/2015) 180 tablet 3 Not Taking at Unknown time  . doxycycline (VIBRA-TABS) 100 MG tablet Take 1 tablet (100 mg total) by mouth 2 (two) times daily. (Patient not taking: Reported on 02/18/2015) 20 tablet 0 Not Taking at Unknown time  . testosterone cypionate (DEPOTESTOTERONE CYPIONATE) 200 MG/ML injection Inject 200 mg every 14 days (Patient not taking: Reported on 02/18/2015) 10 mL 0 Not Taking at Unknown time   Scheduled:  . amLODipine  2.5 mg Oral Daily  . clopidogrel  75 mg Oral Q breakfast  . dextrose  12.5 g Intravenous Once  . escitalopram  20 mg Oral Daily  . folic acid  1 mg Oral Daily  . heparin  5,000 Units Subcutaneous 3 times per day  . insulin aspart  0-15 Units Subcutaneous TID WC  . insulin glargine  11 Units Subcutaneous QHS  . levothyroxine  75 mcg Oral Daily  . metoprolol tartrate  12.5 mg Oral BID  . multivitamin with minerals  1 tablet Oral Daily  . piperacillin-tazobactam (ZOSYN)  IV  3.375 g Intravenous Q8H  . pravastatin  20 mg Oral QPM  . saccharomyces boulardii  250 mg Oral BID  . sodium chloride  500 mL Intravenous Once  . sodium chloride  3 mL Intravenous Q12H  . thiamine  100 mg Oral Daily   Or  . thiamine  100 mg Intravenous Daily  . vancomycin  750 mg Intravenous Q24H  . vitamin B-12  1,000 mcg Oral Daily   Infusions:  . sodium chloride     Assessment: 74yo male who presented with suspected infection in L foot. Post Op Day #2. Pharmacy dosing vancomycin and zosyn for suspected cellulitis. WBC remains wnl and sCr has trended up to 1.69. CrCl ~ 32 mL/min   Vanc 6/13 >> Zosyn 6/13 >>   6/13 BCx x2>> NGTD  Goal of Therapy:  Vancomycin trough level 10-15 mcg/ml  Plan:  Decrease Vancomycin to 500 mg IV q24h Zosyn 3.375g IV q8h Measure antibiotic drug levels at steady state Follow up culture  results,  renal function, and clinical course  Albertina Parr, PharmD., BCPS Clinical Pharmacist Pager [phone]

## 2015-02-21 NOTE — Evaluation (Signed)
Occupational Therapy Evaluation Patient Details Name: Benjamin Hodge MRN: [id number] DOB: 1941-01-16 Today's Date: 02/21/2015    History of Present Illness presented with worsening ulceration of his left third toe and fifth toe. Pt with hx of COPD and IBS   Clinical Impression   Pt is at set up/sup - Mod I level with ADLs. Pt is min guard A - sup with functional mobility. Pt minimally unsteady during ambulation to bathroom using no AD; pt reaching out for wall and IV pole. Pt used grab bars at toilet and in shower for transfer safety.Pt with decreased endurance and states,  "I just feel very tired and depleted from all this diarrhea". No further acute OT services indicated at this time, recommend PT consult for functional mobility/balance safety    Follow Up Recommendations  No OT follow up;Supervision/Assistance - 24 hour initially   Equipment Recommendations  Tub/shower seat (grab bars at toilet)    Recommendations for Other Services PT consult     Precautions / Restrictions Precautions Precautions: Fall Restrictions Weight Bearing Restrictions: No      Mobility Bed Mobility Overal bed mobility: Modified Independent                Transfers Overall transfer level: Needs assistance Equipment used: None Transfers: Sit to/from Stand Sit to Stand: Supervision;Min guard         General transfer comment: pt slightly unsteady and reaching out for wall and IV pole during ambualtion to and from bathroom    Balance Overall balance assessment: Needs assistance Sitting-balance support: No upper extremity supported;Feet supported Sitting balance-Leahy Scale: Good     Standing balance support: No upper extremity supported;During functional activity   Standing balance comment: pt slightly unsteady and reaching out for wall and IV pole during ambualtion to and from bathroom                            ADL Overall ADL's : Needs assistance/impaired      Grooming: Wash/dry hands;Wash/dry face;Standing;Supervision/safety   Upper Body Bathing: Sitting;Modified independent;Set up   Lower Body Bathing: Supervison/ safety;Sit to/from stand   Upper Body Dressing : Modified independent;Set up   Lower Body Dressing: Sit to/from stand;Supervision/safety   Toilet Transfer: Min guard;Grab bars;Ambulation       Tub/ Shower Transfer: Min guard;Grab bars;Ambulation   Functional mobility during ADLs: Min guard General ADL Comments: pt slightly unsteady and reaching out for wall and IV pole during ambualtion to and from bathroom     Vision  wears reading glasses, no change from baseline   Perception Perception Perception Tested?: No   Praxis Praxis Praxis tested?: Not tested    Pertinent Vitals/Pain Pain Assessment: No/denies pain     Hand Dominance Right   Extremity/Trunk Assessment Upper Extremity Assessment Upper Extremity Assessment: Overall WFL for tasks assessed;Generalized weakness   Lower Extremity Assessment Lower Extremity Assessment: Defer to PT evaluation   Cervical / Trunk Assessment Cervical / Trunk Assessment: Normal   Communication Communication Communication: No difficulties   Cognition Arousal/Alertness: Awake/alert Behavior During Therapy: WFL for tasks assessed/performed Overall Cognitive Status: Within Functional Limits for tasks assessed                     General Comments   pt very pleasant and cooperative                 Home Living Family/patient expects to be discharged to:: Private residence  Living Arrangements: Other relatives (grand daughter) Available Help at Discharge: Family Type of Home: Other(Comment) (condo) Home Access: Stairs to enter Entrance Stairs-Number of Steps: 1   Home Layout: Two level;Able to live on main level with bedroom/bathroom     Bathroom Shower/Tub: Producer, television/film/video: Standard     Home Equipment: Grab bars - tub/shower           Prior Functioning/Environment Level of Independence: Independent             OT Diagnosis: Generalized weakness   OT Problem List: Decreased activity tolerance;Impaired balance (sitting and/or standing)   OT Treatment/Interventions:      OT Goals(Current goals can be found in the care plan section) Acute Rehab OT Goals Patient Stated Goal: go home  OT Frequency:     Barriers to D/C:  none                        End of Session Equipment Utilized During Treatment: Gait belt Nurse Communication: Mobility status  Activity Tolerance: Patient tolerated treatment well Patient left: in bed;with call bell/phone within reach;with bed alarm set   Time: 0454-0981 OT Time Calculation (min): 32 min Charges:  OT General Charges $OT Visit: 1 Procedure OT Evaluation $Initial OT Evaluation Tier I: 1 Procedure OT Treatments $Self Care/Home Management : 8-22 mins $Therapeutic Activity: 8-22 mins G-Codes:    Benjamin Hodge 02/21/2015, 1:04 PM

## 2015-02-21 NOTE — Progress Notes (Signed)
[facility] PROGRESS NOTE  Benjamin Hodge NUU:[id number] DOB: 1941/05/15 DOA: 02/18/2015  PCP: Estill Dooms, MD  Brief HPI: 74 year old Caucasian male with a past medical history of diabetes, peripheral vascular disease, coronary artery disease, hypertension, COPD, presented with worsening ulceration of his left third and fifth toes. Patient was placed on oral antibiotics as an outpatient. He was being followed by a podiatrist. Because of the worsening wounds patient was referred for hospitalization. He underwent an arterial duplex study which revealed 58-69% reduction in the right mid SFA 70-99% reduction in the left proximal SFA. Vascular surgery was consulted. Patient underwent arteriogram followed by atherectomy of the left popliteal artery and drug-coated balloon angioplasty of the same vessel. This was done on June 14.  Past medical history:  Past Medical History  Diagnosis Date  . CAD (coronary artery disease)     LAD 40-50% stenosis, first diagonal small 90% stenosis, circumflex 70% stenosis, OM 80% stenosis, right coronary artery 80-90% stenosis.  . Right upper lobe pneumonia 11/28/2008  . Anxiety 07/18/2008  . Irritable bowel syndrome 04/27/2008  . COPD (chronic obstructive pulmonary disease) 02/27/2008  . B12 deficiency 02/16/2008    in 5/09: B12 262, MMA 1040  . Chronic diarrhea 01/01/2008    s/p EGD 10/06: H pylori + gastritis, duodenal biopsy normal (Dr. Benson Norway); s/p colonoscopy 10/06: hemorrhoids (Dr. Benson Norway); evaluated by Dr. Scarlette Shorts  in 8/09: tissue transglutaminase AB negative, VIP normal, stool fat content normal, urine 5-HIAA normal; normal GES 11/09 (done for "fluctuating sugars")  . 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 Orthostatic hypotension 02/09/2007  . Hypertension 02/09/2007  . Spinal stenosis in cervical region 05/03    MRI  . Erectile dysfunction 09/27/2006    s/p penile implant  . Anemia, iron deficiency 09/27/2006      neg colonoscopy 2006 - Dr. Benson Norway; ferritin 152; hgb  15.7  on 07/07  . Hypothyroidism 09/27/2006    TSH 2.639  07/07  . Hypersomnia 09/27/2006    evaluated by Dr. Baird Lyons (5/08) and Dr. Brett Fairy; PSG 8/09: chronic delayed sleep phase syndrome (patient sleeps during the day and is awake at night), nocturnal myoclonus (eval for RLS and IDA suggested). Pt advised to change sleeping behavior  . Diabetes mellitus type II 09/07/1984    poorly controlled, complicated by peripheral neuropathy, microalbuminuria, mild non proliferative retinopathy, s/p DKA 7/05, on insulin pump x 4/09, started a pump vacation on 11/19/2008  . Hyperlipidemia   . 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 Hemorrhoids   . Chronic kidney disease   . Unspecified hereditary and idiopathic peripheral neuropathy   . Other chronic pain   . Other malaise and fatigue   . Depression   . Hypertrophy of prostate without urinary obstruction and other lower urinary tract symptoms (LUTS)   . Lumbago   . Heart attack 03/2014    mild    Consultants: Vascular surgery.  Procedures:   1. Ultrasound-guided access, right femoral artery 2. Abdominal aortogram 3. Left lower extremity runoff 4. Third order catheterization 5. Atherectomy, left popliteal artery 6. Drug coated balloon angioplasty, left popliteal artery  Antibiotics: Was on oral clindamycin and ciprofloxacin as outpatient On vancomycin and Zosyn  Subjective: Patient continues to feel well. Occasional pain in his toes. No nausea, vomiting. No chest pain.   Objective: Vital Signs  Filed Vitals:   02/21/15 0000 02/21/15 0037 02/21/15 0314 02/21/15 0339  BP: 145/60   110/67  Pulse:    63  Temp:  98.1 F (36.7 C)  TempSrc:    Oral  Resp:    20  Height:      Weight:  56.6 kg (124 lb 12.5 oz) 56.6 kg (124 lb 12.5 oz)   SpO2:    98%    Intake/Output Summary (Last 24 hours) at 02/21/15  0746 Last data filed at 02/20/15 1900  Gross per 24 hour  Intake      0 ml  Output    400 ml  Net   -400 ml   Filed Weights   02/20/15 0500 02/21/15 0037 02/21/15 0314  Weight: 56.8 kg (125 lb 3.5 oz) 56.6 kg (124 lb 12.5 oz) 56.6 kg (124 lb 12.5 oz)    General appearance: alert, cooperative, appears stated age and no distress Resp: clear to auscultation bilaterally Cardio: regular rate and rhythm, S1, S2 normal, no murmur, click, rub or gallop GI: soft, non-tender; bowel sounds normal; no masses,  no organomegaly Extremities: Gangrenous left third and fifth toes. Pulses: Poorly palpable in the lower extremities but present Neurologic: No focal deficits  Lab Results:  Basic Metabolic Panel:  Recent Labs Lab 02/18/15 1747 02/18/15 1748 02/20/15 0838 02/21/15 0514  NA 137 138 139 137  K 5.3* 5.2* 3.8 4.2  CL 102 101 104 103  CO2 _0 GLUCOSE 264* 257* 116* 139*  BUN 24* 24* 13 18  CREATININE 1.35* 1.43* 1.31* 1.69*  CALCIUM 8.8* 9.0 8.6* 8.5*   Liver Function Tests:  Recent Labs Lab 02/18/15 1748  AST 29  ALT 22  ALKPHOS 80  BILITOT 0.5  PROT 6.6  ALBUMIN 3.3*   CBC:  Recent Labs Lab 02/18/15 1747 02/20/15 0838 02/21/15 0514  WBC 8.9 6.6 7.7  NEUTROABS 6.9  --   --   HGB 10.5* 10.1* 9.6*  HCT 31.3* 30.3* 27.8*  MCV 94.0 92.7 92.4  PLT 262 228 218   CBG:  Recent Labs Lab 02/20/15 0613 02/20/15 1211 02/20/15 1841 02/20/15 2132 02/21/15 0631  GLUCAP 315* 160* 97 377* 103*    Recent Results (from the past 240 hour(s))  Culture, blood (routine x 2)     Status: None (Preliminary result)   Collection Time: 02/18/15  8:31 PM  Result Value Ref Range Status   Specimen Description BLOOD RIGHT ANTECUBITAL  Final   Special Requests BOTTLES DRAWN AEROBIC AND ANAEROBIC 5CC EA  Final   Culture   Final           BLOOD CULTURE RECEIVED NO GROWTH TO DATE CULTURE WILL BE HELD FOR 5 DAYS BEFORE ISSUING A FINAL NEGATIVE REPORT Performed at [facility]    Report Status PENDING  Incomplete  Culture, blood (routine x 2)     Status: None (Preliminary result)   Collection Time: 02/18/15  8:50 PM  Result Value Ref Range Status   Specimen Description BLOOD LEFT ANTECUBITAL  Final   Special Requests BOTTLES DRAWN AEROBIC AND ANAEROBIC 5CC EA  Final   Culture   Final           BLOOD CULTURE RECEIVED NO GROWTH TO DATE CULTURE WILL BE HELD FOR 5 DAYS BEFORE ISSUING A FINAL NEGATIVE REPORT Performed at [facility]    Report Status PENDING  Incomplete      Studies/Results: No results found.  Medications:  Scheduled: . amLODipine  2.5 mg Oral Daily  . clopidogrel  75 mg Oral Q breakfast  . dextrose  12.5 g Intravenous Once  . escitalopram  20 mg Oral Daily  . folic acid  1 mg Oral Daily  . heparin  5,000 Units Subcutaneous 3 times per day  . insulin aspart  0-15 Units Subcutaneous TID WC  . insulin glargine  11 Units Subcutaneous QHS  . levothyroxine  75 mcg Oral Daily  . metoprolol tartrate  12.5 mg Oral BID  . multivitamin with minerals  1 tablet Oral Daily  . piperacillin-tazobactam (ZOSYN)  IV  3.375 g Intravenous Q8H  . pravastatin  20 mg Oral QPM  . saccharomyces boulardii  250 mg Oral BID  . sodium chloride  3 mL Intravenous Q12H  . thiamine  100 mg Oral Daily   Or  . thiamine  100 mg Intravenous Daily  . vancomycin  750 mg Intravenous Q24H  . vitamin B-12  1,000 mcg Oral Daily   Continuous: . sodium chloride 75 mL/hr at 02/19/15 2000  . 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 Patient underwent arteriogram with balloon angioplasty. Per vascular the toes will need to demarcate. At that point, intervention can be performed. Continue IV antibiotics for now. Patient's creatinine was noted to be elevated today. He cannot get contrast with MRI. There is also a question regarding his penile implant. Discussed with Dr. Trula Slade with vascular surgery. There is no clear indication to MRI at this time. He does not feel that it would change management as the patient will likely require toe amputations. So at this point, we will discontinue MRI. Change him to oral antibiotics that he was taking at home at the time of discharge. ESR 50, CRP <0.5. Local wound care.  Peripheral vascular disease status post angioplasty Stable. Vascular surgery following.  Loose Stools Likely due to antibiotics. Continue probiotics. C. difficile is negative. Lomotil as needed. Patient tells me that he chronically gets loose stools.  Acute on Chronic KD Etiology is unclear. It could be related to the episodes of hypotension and some degree of dehydration. He is making urine. We will give him IV fluids and repeat labs later today and tomorrow. Consider vancomycin.  History of essential Hypertension  Patient did have episodes of low blood pressure last night. Blood pressure has stabilized. Continue to monitor.   History of CAD with hx of NSTEMI Continue aspirin. Continue Plavix for now--he was discharged with aspirin and Plavix after his admission in July 2015. Heart catheterization 03/13/2014 revealed three-vessel disease--medical therapy was recommended.  History of Diabetes mellitus type 2 with renal complications The patient appears to have poor insight. 01/01/2015  hemoglobin A1c 10.9. Continue reduced dose of long-acting insulin (pt was on Toujeo).  History of COPD  Clinically stable on room air. Patient has greater than 80-pack-year history. Quit smoking 10 years ago   ADHD Continue Ritalin   DVT Prophylaxis: Subcutaneous heparin    Code Status: Full code  Family Communication: Discussed with the patient. So discussed in detail  with patient's son. Apparently patient lives in a townhouse by himself, but his granddaughter lives on the second floor. His granddaughter is currently out of town. Disposition Plan: PT and OT to evaluate. Will need wound care, preferably via home health. Anticipate discharge tomorrow and his son will be able to arrange for help at home.  Follow-up Appointment?: Will need to follow-up with vascular surgery in one month.   LOS: 3 days   [facility] Pager [phone] 02/21/2015, 7:46 AM  If 7PM-7AM, please contact night-coverage at [email or web address], password [facility]

## 2015-02-21 NOTE — Evaluation (Signed)
Physical Therapy Evaluation Patient Details Name: Benjamin Hodge MRN: [id number] DOB: 10-20-1940 Today's Date: 02/21/2015   History of Present Illness  Patient is a 74 yo male admitted 02/18/15 with worsening ulceration of Lt 3rd and 5th toes.  Patient s/p atherectomy and balloon angioplasty of Lt pop artery.  PMH:  DM, PVD, CAD, HTN, COPD  Clinical Impression  Patient presents with problems listed below.  Will benefit from acute PT to maximize functional independence prior to discharge.  Patient with slight gait instability.  Recommend HHPT f/u for balance training.    Follow Up Recommendations Home health PT;Supervision - Intermittent    Equipment Recommendations  None recommended by PT    Recommendations for Other Services       Precautions / Restrictions Precautions Precautions: Fall Restrictions Weight Bearing Restrictions: No      Mobility  Bed Mobility Overal bed mobility: Modified Independent                Transfers Overall transfer level: Needs assistance Equipment used: None Transfers: Sit to/from Stand Sit to Stand: Supervision         General transfer comment: Supervision for safety only  Ambulation/Gait Ambulation/Gait assistance: Min guard Ambulation Distance (Feet): 110 Feet Assistive device: None Gait Pattern/deviations: Step-through pattern;Decreased stride length;Decreased step length - right;Decreased step length - left;Staggering left;Staggering right   Gait velocity interpretation: at or above normal speed for age/gender General Gait Details: Patient with slightly unsteady gait, staggering to both sides.  Patient able to self-correct without loss of balance.  Stairs            Wheelchair Mobility    Modified Rankin (Stroke Patients Only)       Balance Overall balance assessment: Needs assistance Sitting-balance support: No upper extremity supported;Feet supported Sitting balance-Leahy Scale: Good     Standing  balance support: No upper extremity supported;During functional activity   Standing balance comment: No loss of balance during gait.  Patient slightly unsteady                             Pertinent Vitals/Pain Pain Assessment: No/denies pain    Home Living Family/patient expects to be discharged to:: Private residence Living Arrangements: Other relatives (granddaughter) Available Help at Discharge: Family;Available PRN/intermittently (granddaughter works) Type of Home: Other(Comment) (Townhouse/condo) Home Access: Stairs to enter   Secretary/administrator of Steps: 1 Home Layout: Two level;Able to live on main level with bedroom/bathroom Home Equipment: None      Prior Function Level of Independence: Independent               Hand Dominance   Dominant Hand: Right    Extremity/Trunk Assessment   Upper Extremity Assessment: Defer to OT evaluation           Lower Extremity Assessment: Overall WFL for tasks assessed      Cervical / Trunk Assessment: Normal  Communication   Communication: No difficulties  Cognition Arousal/Alertness: Awake/alert Behavior During Therapy: WFL for tasks assessed/performed Overall Cognitive Status: Within Functional Limits for tasks assessed                      General Comments      Exercises        Assessment/Plan    PT Assessment Patient needs continued PT services  PT Diagnosis Abnormality of gait   PT Problem List Decreased balance;Decreased mobility  PT Treatment Interventions DME instruction;Gait training;Functional mobility  training;Therapeutic activities;Therapeutic exercise;Balance training;Patient/family education   PT Goals (Current goals can be found in the Care Plan section) Acute Rehab PT Goals Patient Stated Goal: go home PT Goal Formulation: With patient Time For Goal Achievement: 02/28/15 Potential to Achieve Goals: Good    Frequency Min 3X/week   Barriers to discharge Decreased  caregiver support Does not have 24 hour assist    Co-evaluation               End of Session Equipment Utilized During Treatment: Gait belt Activity Tolerance: Patient tolerated treatment well Patient left: in bed;with call bell/phone within reach Nurse Communication: Mobility status (Recommend HHPT)         Time: 0454-0981 PT Time Calculation (min) (ACUTE ONLY): 19 min   Charges:   PT Evaluation $Initial PT Evaluation Tier I: 1 Procedure     PT G CodesVena Austria 02-Mar-2015, 4:30 PM Durenda Hurt. Renaldo Fiddler, [facility] Acute Rehab Services Pager [phone]

## 2015-02-22 ENCOUNTER — Telehealth: Payer: Self-pay | Admitting: Vascular Surgery

## 2015-02-22 LAB — CBC
HCT: 27.4 % — ABNORMAL LOW (ref 39.0–52.0)
Hemoglobin: 9.2 g/dL — ABNORMAL LOW (ref 13.0–17.0)
MCH: 31.6 pg (ref 26.0–34.0)
MCHC: 33.6 g/dL (ref 30.0–36.0)
MCV: 94.2 fL (ref 78.0–100.0)
PLATELETS: 198 10*3/uL (ref 150–400)
RBC: 2.91 MIL/uL — AB (ref 4.22–5.81)
RDW: 13.9 % (ref 11.5–15.5)
WBC: 7.8 10*3/uL (ref 4.0–10.5)

## 2015-02-22 LAB — BASIC METABOLIC PANEL
ANION GAP: 7 (ref 5–15)
BUN: 20 mg/dL (ref 6–20)
CO2: 26 mmol/L (ref 22–32)
Calcium: 8.2 mg/dL — ABNORMAL LOW (ref 8.9–10.3)
Chloride: 101 mmol/L (ref 101–111)
Creatinine, Ser: 1.84 mg/dL — ABNORMAL HIGH (ref 0.61–1.24)
GFR calc Af Amer: 41 mL/min — ABNORMAL LOW (ref >60)
GFR, EST NON AFRICAN AMERICAN: 35 mL/min — AB (ref >60)
Glucose, Bld: 206 mg/dL — ABNORMAL HIGH (ref 65–99)
POTASSIUM: 4.2 mmol/L (ref 3.5–5.1)
SODIUM: 134 mmol/L — AB (ref 135–145)

## 2015-02-22 LAB — GLUCOSE, CAPILLARY
Glucose-Capillary: 125 mg/dL — ABNORMAL HIGH (ref 65–99)
Glucose-Capillary: 198 mg/dL — ABNORMAL HIGH (ref 65–99)
Glucose-Capillary: 209 mg/dL — ABNORMAL HIGH (ref 65–99)

## 2015-02-22 MED ORDER — METOPROLOL TARTRATE 25 MG PO TABS: 12.5000 mg | ORAL_TABLET | Freq: Every day | ORAL | Status: DC

## 2015-02-22 MED ORDER — CLINDAMYCIN HCL 300 MG PO CAPS: 300.0000 mg | ORAL_CAPSULE | Freq: Three times a day (TID) | ORAL | Status: DC

## 2015-02-22 MED ORDER — CIPROFLOXACIN HCL 250 MG PO TABS: 250.0000 mg | ORAL_TABLET | Freq: Two times a day (BID) | ORAL | Status: DC

## 2015-02-22 MED ORDER — SACCHAROMYCES BOULARDII 250 MG PO CAPS: 250.0000 mg | ORAL_CAPSULE | Freq: Two times a day (BID) | ORAL | Status: DC

## 2015-02-22 MED ORDER — ADULT MULTIVITAMIN W/MINERALS CH: 1.0000 | ORAL_TABLET | Freq: Every day | ORAL | Status: AC

## 2015-02-22 NOTE — Progress Notes (Signed)
      Patient is being discharged right now.  He will f/u with Dr. Darrick Penna in 4 weeks.  Continue antibiotics and Plavix.   Jin Capote MAUREEN PA-C

## 2015-02-22 NOTE — Discharge Instructions (Signed)

## 2015-02-22 NOTE — Consult Note (Addendum)
WOC wound consult note Reason for Consult: Consult requested for left toe wounds.  Pt has been followed by podiatry; refer to progress notes from 6/14 by Dr Ardelle Anton.  No dressing change orders were provided at that time and bedside nurses have requested assistance with topical treatment. Wound type: Full thickness wounds to 3rd and 5th left toes.  Previously blistered skin referred to in progress note has now ruptured and evolved into full thickness tissue loss; 2X3X.1cm to small toe; 20% yellow slough, 80% red and moist.  No odor, small amt blood-tinged drainage. 5th toe 2X1cm, 100% red and moist, small amt bleeding, no odor.  Bone palpable.   Dressing procedure/placement/frequency: Pt states he will make a follow-up apt with Dr Ardelle Anton next week for follow-up and further plan of care. Antibiotic ointment and gauze dressing Q day until further input is provided by podiatry.  Discussed plan of care and demonstrated dressing change technique and tucking guaze between toes to absorb drainage and reduce pressure. He verbalizes understanding and states he has a walking shoe for offloading. He plans to discharge home today. Please re-consult if further assistance is needed.  Thank-you,  Cammie Mcgee MSN, RN, CWOCN, [facility], CNS [phone]

## 2015-02-22 NOTE — Discharge Summary (Signed)
[facility]  Physician Discharge Summary   Patient ID: Benjamin Hodge MRN: [id number] DOB/AGE: [id number] y.o.  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 Patient to follow-up with vascular surgeon, Dr. Oneida Alar in 1 month. 2. Home health has been arranged for PT and OT as well as RN for wound care   DISCHARGE CONDITION: fair  Diet recommendation: Modified carbohydrate  Filed Weights   02/21/15 0314 02/22/15 0001 02/22/15 0420  Weight: 56.6 kg (124 lb 12.5 oz) 57.8 kg (127 lb 6.8 oz) 57.8 kg (127 lb 6.8 oz)    INITIAL HISTORY: 74 year old Caucasian male with a past medical history of diabetes, peripheral vascular disease, coronary artery disease, hypertension, COPD, presented with worsening ulceration of his left third and fifth toes. Patient was placed on oral antibiotics as an outpatient. He was being followed by a podiatrist. Because of the worsening wounds patient was referred for hospitalization. He underwent an arterial duplex study which revealed 58-69% reduction in the right mid SFA 70-99% reduction in the left proximal SFA. Vascular surgery was consulted. Patient underwent arteriogram followed by atherectomy of the left popliteal artery and drug-coated balloon angioplasty of the same vessel. This was done on June 14.  Consultants: Vascular surgery.  Procedures:   1. Ultrasound-guided access, right femoral artery 2. Abdominal aortogram 3. Left lower extremity runoff 4. Third  order catheterization 5. Atherectomy, left popliteal artery 6. Drug coated balloon angioplasty, left popliteal artery   HOSPITAL COURSE:   Diabetic foot ulcers with vascular insufficiency Patient underwent arteriogram followed by balloon angioplasty. Per vascular the toes will need to demarcate. At that point, intervention can be performed. Patient was placed on IV antibiotics. Initially MRI of the foot was ordered. After discussions with vascular surgery it was felt not to be necessary as it doesn't change end outcome. So MRI was discontinued. Patient was transitioned back to oral antibiotics at the time of discharge. He will continue to need wound care. And had follow-up with vascular surgery. Further decisions regarding the toes will have to be made. ESR 50, CRP <0.5. Local wound care.  Peripheral vascular disease status post angioplasty Stable. Vascular surgery to follow in one month.  Loose Stools Likely due to antibiotics. Continue probiotics. C. difficile is negative. Lomotil as needed. Patient tells me that he chronically gets loose stools.  Acute on Chronic KD Creatinine higher than baseline, but stable. He is making urine. Antibiotics could've contributed. He'll be discharged on renally dosed antibiotics. Repeat blood work next week by home health to make sure her creatinine is stable.   History of essential Hypertension  Patient did have episodes of low blood pressure at times during this hospitalization, although he remained asymptomatic. Dose of metoprolol was reduced. Blood pressure has stabilized.   History of CAD with hx of NSTEMI Continue aspirin. Continue Plavix for now--he was discharged with aspirin and Plavix after his admission in July 2015. Heart catheterization 03/13/2014 revealed three-vessel disease--medical therapy was recommended.  History of Diabetes mellitus type 2 with renal complications The patient appears to have poor insight.  01/01/2015 hemoglobin A1c  10.9. He has very brittle diabetes with blood sugars going from 400 to 100 in a matter of 2 hours. He tells me that this is the case even at home. He is followed by endocrinologist, Dr. Buddy Duty. He should resume his home medication regimen.  History of COPD  Clinically stable on room air. Patient has greater than 80-pack-year history. Quit smoking 10 years ago   ADHD Continue Ritalin  Patient very keen on going home. Discussed with the son yesterday. Patient lives in a town home and his granddaughter lives upstairs. She helps him with chores. Granddaughter is out of town. Son told me that he will be able to arrange for help today and tomorrow. Home health is being arranged. Patient has been told to stay off of work for 2 weeks. Wound care as before. Okay for discharge.   PERTINENT LABS:  The results of significant diagnostics from this hospitalization (including imaging, microbiology, ancillary and laboratory) are listed below for reference.    Microbiology: Recent Results (from the past 240 hour(s))  Culture, blood (routine x 2)     Status: None (Preliminary result)   Collection Time: 02/18/15  8:31 PM  Result Value Ref Range Status   Specimen Description BLOOD RIGHT ANTECUBITAL  Final   Special Requests BOTTLES DRAWN AEROBIC AND ANAEROBIC 5CC EA  Final   Culture   Final           BLOOD CULTURE RECEIVED NO GROWTH TO DATE CULTURE WILL BE HELD FOR 5 DAYS BEFORE ISSUING A FINAL NEGATIVE REPORT Performed at [facility]    Report Status PENDING  Incomplete  Culture, blood (routine x 2)     Status: None (Preliminary result)   Collection Time: 02/18/15  8:50 PM  Result Value Ref Range Status   Specimen Description BLOOD LEFT ANTECUBITAL  Final   Special Requests BOTTLES DRAWN AEROBIC AND ANAEROBIC 5CC EA  Final   Culture   Final           BLOOD CULTURE RECEIVED NO GROWTH TO DATE CULTURE WILL BE HELD FOR 5 DAYS BEFORE ISSUING A FINAL NEGATIVE  REPORT Performed at [facility]    Report Status PENDING  Incomplete  Clostridium Difficile by PCR (not at [facility])     Status: None   Collection Time: 02/20/15  5:10 PM  Result Value Ref Range Status   C difficile by pcr NEGATIVE NEGATIVE Final     Labs: Basic Metabolic Panel:  Recent Labs Lab 02/18/15 1748 02/20/15 0838 02/21/15 0514 02/21/15 1303 02/22/15 0225  NA 138 139 137 136 134*  K 5.2* 3.8 4.2 4.0 4.2  CL 101 104 103 99* 101  CO2 _0 GLUCOSE 257* 116* 139* 348* 206*  BUN 24* _1 CREATININE 1.43* 1.31* 1.69* 1.71* 1.84*  CALCIUM 9.0 8.6* 8.5* 8.2* 8.2*   Liver Function Tests:  Recent Labs Lab 02/18/15 1748  AST 29  ALT 22  ALKPHOS 80  BILITOT 0.5  PROT 6.6  ALBUMIN 3.3*   CBC:  Recent Labs Lab 02/18/15 1747 02/20/15 0838 02/21/15 0514 02/22/15 0225  WBC 8.9 6.6 7.7 7.8  NEUTROABS 6.9  --   --   --   HGB 10.5* 10.1* 9.6* 9.2*  HCT 31.3* 30.3* 27.8* 27.4*  MCV 94.0 92.7 92.4 94.2  PLT 262 228 218 198    CBG:  Recent Labs Lab 02/21/15 2011 02/21/15 2135 02/22/15 0005 02/22/15 0629 02/22/15 1234  GLUCAP 402* 160*  125* 198* 209*     IMAGING STUDIES Dg Foot Complete Left  02/03/2015   3 views of a skeletally mature individual were obtained of the left foot.  Study includes AP, oblique, lateral projections.  There does appear to be a healed fracture of the lateral portion of the  proximal phalanx of the hallux however it is slightly malaligned and there  is changes of the first MTPJ. There is old healed fractures of the  metatarsals. Vessel calcifications present. There is no cortical  destruction discussed osteomyelitis at this time there is no soft tissue  emphysema. No definitive evidence of acute fracture.   DISCHARGE EXAMINATION: Filed Vitals:   02/22/15 0330 02/22/15 0420 02/22/15 0849 02/22/15 1249  BP: 119/58  149/67 161/60  Pulse: 67  64 66  Temp: 97.8 F (36.6 C)  97.7 F (36.5 C) 97.9 F (36.6  C)  TempSrc: Oral  Oral Oral  Resp: _0 Height:      Weight:  57.8 kg (127 lb 6.8 oz)    SpO2: 97%  96% 98%   General appearance: alert, cooperative, appears stated age and no distress Cardio: regular rate and rhythm, S1, S2 normal, no murmur, click, rub or gallop GI: soft, non-tender; bowel sounds normal; no masses,  no organomegaly  DISPOSITION: Home with home health  Discharge Instructions    Call MD for:  difficulty breathing, headache or visual disturbances    Complete by:  As directed      Call MD for:  extreme fatigue    Complete by:  As directed      Call MD for:  persistant dizziness or light-headedness    Complete by:  As directed      Call MD for:  persistant nausea and vomiting    Complete by:  As directed      Call MD for:  severe uncontrolled pain    Complete by:  As directed      Call MD for:  temperature >100.4    Complete by:  As directed      Discharge instructions    Complete by:  As directed   Please stay out of work for at least 2 weeks. Follow up with Dr. Oneida Alar as discussed.  You were cared for by a hospitalist during your hospital stay. If you have any questions about your discharge medications or the care you received while you were in the hospital after you are discharged, you can call the unit and asked to speak with the hospitalist on call if the hospitalist that took care of you is not available. Once you are discharged, your primary care physician will handle any further medical issues. Please note that NO REFILLS for any discharge medications will be authorized once you are discharged, as it is imperative that you return to your primary care physician (or establish a relationship with a primary care physician if you do not have one) for your aftercare needs so that they can reassess your need for medications and monitor your lab values. If you do not have a primary care physician, you can call [phone] for a physician referral.     Discharge wound  care:    Complete by:  As directed   As you were doing before per your podiatrist.     Increase activity slowly    Complete by:  As directed            ALLERGIES:  Allergies  Allergen Reactions  .  Lipitor [Atorvastatin] Other (See Comments)    Caused pain all over body.  . Ace Inhibitors Other (See Comments)    dizzy  . Angiotensin Receptor Blockers Other (See Comments)    unknown  . Bee Venom Swelling  . Cymbalta [Duloxetine Hcl] Other (See Comments)    dizzy     Current Discharge Medication List    START taking these medications   Details  Multiple Vitamin (MULTIVITAMIN WITH MINERALS) TABS tablet Take 1 tablet by mouth daily. Qty: 30 tablet, Refills: 3    saccharomyces boulardii (FLORASTOR) 250 MG capsule Take 1 capsule (250 mg total) by mouth 2 (two) times daily. Qty: 60 capsule, Refills: 0      CONTINUE these medications which have CHANGED   Details  ciprofloxacin (CIPRO) 250 MG tablet Take 1 tablet (250 mg total) by mouth 2 (two) times daily. Qty: 30 tablet, Refills: 0    clindamycin (CLEOCIN) 300 MG capsule Take 1 capsule (300 mg total) by mouth 3 (three) times daily. Qty: 45 capsule, Refills: 0    metoprolol tartrate (LOPRESSOR) 25 MG tablet Take 0.5 tablets (12.5 mg total) by mouth daily. Qty: 30 tablet, Refills: 2      CONTINUE these medications which have NOT CHANGED   Details  amLODipine (NORVASC) 2.5 MG tablet Take 2.5 mg by mouth daily.    aspirin 81 MG tablet Take 81 mg by mouth daily.     clopidogrel (PLAVIX) 75 MG tablet TAKE 1 TABLET BY MOUTH DAILY WITH BREAKFAST Qty: 30 tablet, Refills: 3    Continuous Glucose Monitor (DEXCOM G4 PLATINUM RECEIVER) KIT USE AS DIRECTED Qty: 1 kit, Refills: 1    diphenoxylate-atropine (LOMOTIL) 2.5-0.025 MG per tablet TAKE ONE OR TWO TABLETS BY MOUTH TWICE DAILY AS NEEDED  Qty: 60 tablet, Refills: 0    escitalopram (LEXAPRO) 20 MG tablet TAKE ONE TABLET BY MOUTH ONE TIME DAILY Qty: 30 tablet, Refills: 5     gabapentin (NEURONTIN) 600 MG tablet TAKE ONE TABLET BY MOUTH THREE TIMES DAILY  Qty: 90 tablet, Refills: 1    HYDROcodone-acetaminophen (NORCO/VICODIN) 5-325 MG per tablet Take one tablet by mouth five times daily as needed for pain Qty: 150 tablet, Refills: 0    Insulin Glargine (TOUJEO SOLOSTAR) 300 UNIT/ML SOPN Inject 11 Units into the skin at bedtime.     insulin lispro (HUMALOG) 100 UNIT/ML KiwkPen Inject 1-6 Units into the skin daily as needed (100-199 = 1 units, 200-299 = 2 units, 300-399 = 3 units, 400-499 = 4 units, 500-599 = 5 units, 600+ = 6 units).     levothyroxine (SYNTHROID, LEVOTHROID) 75 MCG tablet TAKE ONE TABLET BY MOUTH ONE TIME DAILY  Qty: 30 tablet, Refills: 5    methylphenidate (RITALIN) 20 MG tablet Take 1 tablet (20 mg total) by mouth 2 (two) times daily. Qty: 180 tablet, Refills: 0   Associated Diagnoses: Diabetic neuropathy with neurologic complication; Dysautonomia; Small fiber neuropathy    metoCLOPramide (REGLAN) 5 MG tablet USE ONCE A DAY AFTER LUCHTIME MEAL Qty: 90 tablet, Refills: 1    ONE TOUCH ULTRA TEST test strip TEST 8 TIMES PER DAY FOR 30 DAYS Qty: 250 each, Refills: 4    pantoprazole (PROTONIX) 40 MG tablet Take one tablet by mouth once daily for stomach Qty: 30 tablet, Refills: 5    pravastatin (PRAVACHOL) 20 MG tablet Take 1 tablet (20 mg total) by mouth every evening. Qty: 90 tablet, Refills: 3    pregabalin (LYRICA) 50 MG capsule 1-2 capsules at bed  to help neuropathic pain Qty: 60 capsule, Refills: 5   Associated Diagnoses: Type 1 diabetes mellitus with neurological manifestations, uncontrolled    silver sulfADIAZINE (SILVADENE) 1 % cream Apply 1 application topically daily. Qty: 50 g, Refills: 0    vitamin B-12 (CYANOCOBALAMIN) 1000 MCG tablet Take 1,000 mcg by mouth daily.    testosterone cypionate (DEPOTESTOTERONE CYPIONATE) 200 MG/ML injection Inject 200 mg every 14 days Qty: 10 mL, Refills: 0   Associated Diagnoses: Low  serum testosterone level      STOP taking these medications     doxycycline (VIBRA-TABS) 100 MG tablet        Follow-up Information    Follow up with Ruta Hinds, MD In 4 weeks.   Specialties:  Vascular Surgery, Cardiology   Why:  Office will call you to arrange your appt (sent)   Contact information:   [address] [place] [address] [phone]       Call GREEN, Viviann Spare, MD.   Specialty:  Internal Medicine   Why:  As needed   Contact information:   [place] [address] [phone]       Follow up with [facility].   Why:  Home Health  RN, PT and OT arranged, nurse to draw  lab- bmp on 02/26/15   Contact information:   [place] [address] [phone]       TOTAL DISCHARGE TIME: 101 mins  [facility] Pager [phone]  02/22/2015, 3:30 PM

## 2015-02-22 NOTE — Telephone Encounter (Addendum)
-----   Message from Sharee Pimple, RN sent at 02/21/2015 11:17 AM EDT ----- Regarding: Schedule   ----- Message -----    From: Dara Lords, PA-C    Sent: 02/21/2015   9:14 AM      To: Vvs Charge Pool  S/p Atherectomy, left popliteal artery  Drug coated balloon angioplasty, left popliteal artery   F/u with Dr. Darrick Penna in 1 month.  Thanks  notified patient of post op appt. on 03-21-15 at 8:45 with dr. Darrick Penna

## 2015-02-25 ENCOUNTER — Ambulatory Visit (INDEPENDENT_AMBULATORY_CARE_PROVIDER_SITE_OTHER): Payer: Medicare Other | Admitting: Podiatry

## 2015-02-25 ENCOUNTER — Ambulatory Visit: Payer: Medicare Other

## 2015-02-25 ENCOUNTER — Encounter: Payer: Self-pay | Admitting: Podiatry

## 2015-02-25 ENCOUNTER — Ambulatory Visit: Payer: Medicare Other | Admitting: Podiatry

## 2015-02-25 VITALS — BP 99/64 | HR 86 | Resp 12

## 2015-02-25 DIAGNOSIS — I739 Peripheral vascular disease, unspecified: Secondary | ICD-10-CM

## 2015-02-25 DIAGNOSIS — L97522 Non-pressure chronic ulcer of other part of left foot with fat layer exposed: Secondary | ICD-10-CM | POA: Diagnosis not present

## 2015-02-25 DIAGNOSIS — R52 Pain, unspecified: Secondary | ICD-10-CM

## 2015-02-25 LAB — CULTURE, BLOOD (ROUTINE X 2)
Culture: NO GROWTH
Culture: NO GROWTH

## 2015-02-26 ENCOUNTER — Ambulatory Visit: Payer: Medicare Other | Admitting: Cardiovascular Disease

## 2015-02-26 DIAGNOSIS — E1029 Type 1 diabetes mellitus with other diabetic kidney complication: Secondary | ICD-10-CM | POA: Diagnosis not present

## 2015-02-26 DIAGNOSIS — I129 Hypertensive chronic kidney disease with stage 1 through stage 4 chronic kidney disease, or unspecified chronic kidney disease: Secondary | ICD-10-CM | POA: Diagnosis not present

## 2015-02-26 DIAGNOSIS — E1021 Type 1 diabetes mellitus with diabetic nephropathy: Secondary | ICD-10-CM | POA: Diagnosis not present

## 2015-02-26 DIAGNOSIS — I739 Peripheral vascular disease, unspecified: Secondary | ICD-10-CM | POA: Diagnosis not present

## 2015-02-26 DIAGNOSIS — N189 Chronic kidney disease, unspecified: Secondary | ICD-10-CM | POA: Diagnosis not present

## 2015-02-26 DIAGNOSIS — D649 Anemia, unspecified: Secondary | ICD-10-CM | POA: Diagnosis not present

## 2015-02-26 DIAGNOSIS — I2510 Atherosclerotic heart disease of native coronary artery without angina pectoris: Secondary | ICD-10-CM | POA: Diagnosis not present

## 2015-02-26 DIAGNOSIS — J449 Chronic obstructive pulmonary disease, unspecified: Secondary | ICD-10-CM | POA: Diagnosis not present

## 2015-02-26 DIAGNOSIS — L03032 Cellulitis of left toe: Secondary | ICD-10-CM | POA: Diagnosis not present

## 2015-02-27 DIAGNOSIS — L03032 Cellulitis of left toe: Secondary | ICD-10-CM | POA: Diagnosis not present

## 2015-02-28 ENCOUNTER — Telehealth (HOSPITAL_COMMUNITY): Payer: Self-pay | Admitting: *Deleted

## 2015-02-28 ENCOUNTER — Telehealth: Payer: Self-pay | Admitting: *Deleted

## 2015-02-28 NOTE — Telephone Encounter (Signed)
Dr. Ardelle Anton reviewed 02/13/2015 lower extremity dopplers and states pt had stent by vascular doctors after this study.

## 2015-03-02 ENCOUNTER — Encounter: Payer: Self-pay | Admitting: Podiatry

## 2015-03-02 NOTE — Progress Notes (Signed)
Patient ID: Benjamin Hodge, male   DOB: 1941-05-30, 74 y.o.   MRN: [id number]  Subjective: 74 year old male presents the office they for follow-up evaluation of left third and fifth toe ulcerations. He was recently discharged from the hospital which is listed place with vascular surgery. Necessary states that the toes demarcate. He continues daily dressing changes with Silvadene followed by dry sterile dressing. He does get some clear bloody drainage from along particularly the third toe however denies any pus. He states that the erythema has decreased. He continues on antibiotic. He denies any systemic complaints as fevers, chills, nausea, vomiting. Denies any calf pain, chest pain, soreness of breath. No other complaints at this time.  Objective: AAO 3, NAD  DP/PT pulses decreased bilaterally, CRT less than 3 seconds Protective sensation decreased with Simms Weinstein monofilament; decreased vibratory sensation On the left third digit there continues to be an ulceration on the plantar aspect of the level of the PIPJ. The wound does appear to be somewhat more superficial however does extend to the deep fat layer. There does not probe to bone at this time. There is mild edema and erythema to the toe however does appear to be improved compared to what was previously. Along the distal aspect of left third toe the distal aspect of the toe appears to be granular ulceration. There is no ascending saline, fluctuance, crepitus, drainage/purulence, malodor to it of the wounds. No other open lesions or pre-ulcerative lesions identified bilaterally. There is no pain with calf compression, swelling, warmth, erythema.  Assessment: 74 year old male left second and fifth toe ulcerations status post stent  Plan: -Treatment options discussed including all alternatives, risks, and complications -Continue daily dressing changes with Silvadene. We'll await the toes demarcate to see if there viable. Continued  antibiotic. -Monitor for any clinical signs or symptoms of infection and directed to call the office immediately should any occur or go to the ER. -Follow-up in 1 weeks or sooner if any problems are to arise. In the meantime I encouraged and call the office with any questions, concerns, changes symptoms. -Follow-up with vascular surgery. He was confused about this. I told him his date and time as well as the location.   Ovid Curd, DPM

## 2015-03-04 ENCOUNTER — Ambulatory Visit: Payer: Self-pay | Admitting: Internal Medicine

## 2015-03-04 ENCOUNTER — Other Ambulatory Visit: Payer: Self-pay | Admitting: Internal Medicine

## 2015-03-04 ENCOUNTER — Telehealth: Payer: Self-pay | Admitting: Podiatry

## 2015-03-04 ENCOUNTER — Ambulatory Visit: Payer: Medicare Other | Admitting: Podiatry

## 2015-03-04 NOTE — Telephone Encounter (Signed)
Patient called the call service Saturday night stating that his foot was more swollen and he had some redness to the top of the foot. Denies any fevers, chills, nausea, vomiting. Discussed going to the ED. He states it is not that bad and will come to the office on Monday. Discussed with him that if it were to worsen, he develops any red streaking, or any systemic complaints he must go to the ED.

## 2015-03-06 ENCOUNTER — Ambulatory Visit (INDEPENDENT_AMBULATORY_CARE_PROVIDER_SITE_OTHER): Payer: Medicare Other | Admitting: Podiatry

## 2015-03-06 ENCOUNTER — Other Ambulatory Visit: Payer: Self-pay | Admitting: Internal Medicine

## 2015-03-06 ENCOUNTER — Encounter: Payer: Self-pay | Admitting: Podiatry

## 2015-03-06 VITALS — BP 167/81 | HR 64 | Temp 96.5°F | Resp 14

## 2015-03-06 DIAGNOSIS — L97522 Non-pressure chronic ulcer of other part of left foot with fat layer exposed: Secondary | ICD-10-CM | POA: Diagnosis not present

## 2015-03-06 DIAGNOSIS — I739 Peripheral vascular disease, unspecified: Secondary | ICD-10-CM

## 2015-03-07 NOTE — Progress Notes (Signed)
Subjective:     Patient ID: Benjamin Hodge, male   DOB: 23-Sep-1940, 74 y.o.   MRN: [id number]  HPI Benjamin Hodge is a patient of Dr. Ardelle Hodge who presents concerned about the appearance of his second and fifth toes left and also the hallux and has had previous stent and states he is supposed to have increased blood flow   Review of Systems     Objective:   Physical Exam Circulatory status indicates there is a palpable DP pulse and there is moderate warmth to the left foot with distal discoloration and demarcation of the second and fifth digits and also some slight changes in the left hallux with no indications of ulceration or breakdown of tissue. He also works with the vascular physicians and monitoring his condition    Assessment:     Ulceration left second and fifth toe and changes in the left hallux with increased circulation possibly creating a change as he heals with no indication of proximal spread    Plan:     Reviewed condition with patient and the fact that never seen this before so it's difficult for me to make a complete determination as to what is normal or what is not normal. At this time I do not see any acute or other indication that he is in crisis mode and he will see Dr. Ardelle Hodge back on Friday for evaluation. He understands there is a good chance long-term that he will end up with amputation

## 2015-03-08 ENCOUNTER — Encounter: Payer: Self-pay | Admitting: Podiatry

## 2015-03-08 ENCOUNTER — Telehealth: Payer: Self-pay | Admitting: *Deleted

## 2015-03-08 ENCOUNTER — Ambulatory Visit (INDEPENDENT_AMBULATORY_CARE_PROVIDER_SITE_OTHER): Payer: Medicare Other | Admitting: Podiatry

## 2015-03-08 VITALS — BP 126/50 | HR 62 | Temp 98.0°F | Resp 12

## 2015-03-08 DIAGNOSIS — I739 Peripheral vascular disease, unspecified: Secondary | ICD-10-CM | POA: Diagnosis not present

## 2015-03-08 DIAGNOSIS — L97521 Non-pressure chronic ulcer of other part of left foot limited to breakdown of skin: Secondary | ICD-10-CM | POA: Diagnosis not present

## 2015-03-08 NOTE — Telephone Encounter (Signed)
I informed pt I had sent multiple faxes and called home health care services and he would be contacted the beginning of the week, that he should continue the wound care as instructed by Dr. Ardelle Anton.  Pt states he can do that.

## 2015-03-08 NOTE — Patient Instructions (Signed)
Monitor for any signs/symptoms of infection. Call the office immediately if any occur or go directly to the emergency room. Call with any questions/concerns.  

## 2015-03-08 NOTE — Telephone Encounter (Signed)
Dr. Ardelle Anton referred pt to [facility] for daily wound saline cleanses, dress with Silvadene and gauze dressing to left 2, 5th toes.  Faxed and left message informing pt of the [facility] referral.

## 2015-03-12 ENCOUNTER — Telehealth: Payer: Self-pay | Admitting: *Deleted

## 2015-03-12 NOTE — Telephone Encounter (Addendum)
I have sent referral to [facility] care - no staff available for new pts, [facility] - pt just released from their care, and would need to be treated as new case and no new case staff available, [facility] - his insurance is out of network, [facility] - no staff available, per [employer]  Frances Furbish states his insurance is not in network, [facility] doesn't answer the phone.  [facility] care [phone] states will review their staff and call again.  I left a message for pt to continue his dressing changes as instructed by Dr. Ardelle Anton, and call with concerns or call on a nurse could check his foot and redress.  Maryruth Hancock - [facility] care called states doesn't have the staff for a new pt wound care.  I left a message informing pt the home health care agencies were not accepting new pts at this time and to please call our office so we could assess his foot and redress or for him to continue the dressing changes as directed by Dr. Ardelle Anton himself.

## 2015-03-14 DIAGNOSIS — Z794 Long term (current) use of insulin: Secondary | ICD-10-CM | POA: Diagnosis not present

## 2015-03-14 DIAGNOSIS — I739 Peripheral vascular disease, unspecified: Secondary | ICD-10-CM | POA: Diagnosis not present

## 2015-03-14 DIAGNOSIS — E1042 Type 1 diabetes mellitus with diabetic polyneuropathy: Secondary | ICD-10-CM | POA: Diagnosis not present

## 2015-03-14 DIAGNOSIS — E1065 Type 1 diabetes mellitus with hyperglycemia: Secondary | ICD-10-CM | POA: Diagnosis not present

## 2015-03-18 ENCOUNTER — Ambulatory Visit (INDEPENDENT_AMBULATORY_CARE_PROVIDER_SITE_OTHER): Payer: Medicare Other | Admitting: Podiatry

## 2015-03-18 ENCOUNTER — Encounter: Payer: Self-pay | Admitting: Podiatry

## 2015-03-18 VITALS — BP 179/82 | HR 101 | Resp 16

## 2015-03-18 DIAGNOSIS — L97521 Non-pressure chronic ulcer of other part of left foot limited to breakdown of skin: Secondary | ICD-10-CM

## 2015-03-18 NOTE — Patient Instructions (Signed)
Continue daily dressing changes with silvadene and nonadherent dressing Finish antibiotics Monitor for any signs/symptoms of infection. Call the office immediately if any occur or go directly to the emergency room. Call with any questions/concerns.

## 2015-03-18 NOTE — Progress Notes (Signed)
Patient ID: Benjamin Hodge, male   DOB: April 19, 1941, 74 y.o.   MRN: [id number]  Subjective: 74 year old male presents the office they for follow-up evaluation of left third and fifth toe ulcerations. He last saw doctor Regal as he missed his last appointment with me. He states that previously he did have increased swelling and redness. At this foot however it has decreased recently. He's been continuing daily dressing changes to the wounds on the third and fifth toe. He states of the redness as well as the swelling has decreased to his foot. He denies any systemic complaints as fevers, chills, nausea, vomiting.  No other complaints at this time.  Objective: AAO 3, NAD  DP/PT pulses decreased bilaterally, CRT less than 3 seconds Protective sensation decreased with Simms Weinstein monofilament; decreased vibratory sensation On the left third digit there continues to be an ulceration on the plantar aspect of the level of the PIPJ although this is appear to be healing. The wound does appear to be more superficial than it was previously. There does not probe to bone at this time. There is mild edema and erythema to the toe however does appear to continue to improve. Along the distal aspect of left third toe the distal aspect of the toe appears to be granular ulceration. There is also now ulceration on the lateral hallux, second digit in the fourth digit. All the wounds are superficial and appeared to be likely due to friction/irritation due to the swelling. There is no ascending cellulitis, fluctuance, crepitus, drainage/purulence, malodor to it of the wounds.There is mild edema to the foot.   There is no pain with calf compression, swelling, warmth, erythema.  Assessment: 74 year old male with now multiple toe ulcerations  Plan: -Treatment options discussed including all alternatives, risks, and complications -Continue daily dressing changes with Silvadene and a nonadherent dressing.   -Due to the  numerous toe ulcers will refer to wound care as well. Discussed with him he is at a high risk of amputation.  -Monitor for any clinical signs or symptoms of infection and directed to call the office immediately should any occur or go to the ER. -Follow-up in 1 weeks or sooner if any problems are to arise. In the meantime I encouraged and call the office with any questions, concerns, changes symptoms. -Follow-up with vascular surgery. He again seems to be confused about why he needs to see them. I explained to him why and again informed him of his date and time as well as the location.   Ovid Curd, DPM

## 2015-03-19 ENCOUNTER — Encounter: Payer: Self-pay | Admitting: Podiatry

## 2015-03-19 ENCOUNTER — Encounter: Payer: Self-pay | Admitting: Vascular Surgery

## 2015-03-19 NOTE — Progress Notes (Signed)
Patient ID: Benjamin Hodge, male   DOB: Jul 10, 1941, 74 y.o.   MRN: [id number]  Subjective: 74 year old male presents the office for follow-up evaluation of multiple ulcerations to left foot digits. He states he has continue with Silvadene dressing changes daily. He has also continue with the surgical shoe. He believes that the swelling and the redness has decreased to his foot since last appointment. He is currently still taking antibiotics, he has 2 more days of this. Denies any systemic complaints as fevers, chills, nausea, vomiting. Denies any calf pain, chest pain, Semmes of breath. No other complaints at this time.  Objective: AAO 3, NAD DP/PT pulses decreased bilaterally Protective sensation decreased with Semmes Weinstein monofilament On the medial aspect of the left hallux is a hyperkeratotic lesion. Upon debridement there is a very small superficial granular ulceration which is significantly decreased in size. There is also ulceration of the second digit which measures approximately 0.6 x 0.2 cm in his superficial with a fibro-granular wound base. Third digit there is a continuation of the transverse ulceration of the plantar aspect of the PIPJ. There is no probe to bone, undermining, tunneling. The wound does appear to be slightly more superficial than last ointment. There is also a superficial wound on the lateral aspect of the third digit. The fifth digit also has a superficial granular ulceration along the distal aspect of the digit. There is no probe to bone, undermining, tunneling to the digits. There is decrease in erythema to the third digit and there is no erythema over the remaining digits. The edema to the dorsal aspect of the forefoot has decreased since last appointment. There is no ascending cellulitis, questions, crepitus, malodor. There is no drainage or purulence expressed and the wound at this time. No open lesions or pre-ulcer lesions identified bilaterally. No pain with  calf compression, swelling, warmth, erythema.  Assessment: 74 year old male with healing left foot ulcerations  Plan: -Treatment options discussed including all alternatives, risks, and complications -Wounds were all shop and debrided to healthy, bleeding, granular wound base. Silvadene and a nonadherent dressing were applied followed by a DSD. Continue daily dressing changes at home. -I had referred the patient to the wound care center however at this time the wounds appear to be healing. If he gets an appointment with them and recommended going however I was told he may not be able to get appointment for some time.  -Finish course of antibiotics -Continue surgical shoe -Follow-up with vascular surgery -Follow-up 2 weeks or sooner if any problems arise. In the meantime, encouraged to call the office with any questions, concerns, change in symptoms.   Ovid Curd, DPM

## 2015-03-21 ENCOUNTER — Ambulatory Visit (INDEPENDENT_AMBULATORY_CARE_PROVIDER_SITE_OTHER): Payer: Medicare Other | Admitting: Vascular Surgery

## 2015-03-21 ENCOUNTER — Encounter: Payer: Self-pay | Admitting: Vascular Surgery

## 2015-03-21 VITALS — BP 118/76 | HR 93 | Temp 97.6°F | Resp 18 | Ht 66.0 in | Wt 124.5 lb

## 2015-03-21 DIAGNOSIS — I739 Peripheral vascular disease, unspecified: Secondary | ICD-10-CM | POA: Diagnosis not present

## 2015-03-21 DIAGNOSIS — L98499 Non-pressure chronic ulcer of skin of other sites with unspecified severity: Secondary | ICD-10-CM

## 2015-03-21 DIAGNOSIS — I70209 Unspecified atherosclerosis of native arteries of extremities, unspecified extremity: Secondary | ICD-10-CM

## 2015-03-21 DIAGNOSIS — R2681 Unsteadiness on feet: Secondary | ICD-10-CM | POA: Diagnosis not present

## 2015-03-21 NOTE — Progress Notes (Signed)
Patient is a 74 year old male who returns for follow-up today. He recently underwent atherectomy of his left popliteal artery as well as angioplasty of left popliteal artery with drug coated balloon. This was done for nonhealing wounds on the left foot. He states that the wounds have been slowly healing. He denies rest pain. He denies claudication symptoms. He does complain of some occasional dizziness and instability of gait. He states that this is not as bad if he gets up slowly. He is on Plavix and aspirin. He has been on multiple courses of any bile duct in the past but currently is off all of these.  Current Outpatient Prescriptions on File Prior to Visit  Medication Sig Dispense Refill  . amLODipine (NORVASC) 2.5 MG tablet Take 2.5 mg by mouth daily.    Marland Kitchen aspirin 81 MG tablet Take 81 mg by mouth daily.     . clopidogrel (PLAVIX) 75 MG tablet TAKE 1 TABLET BY MOUTH DAILY WITH BREAKFAST 30 tablet 3  . Continuous Glucose Monitor (DEXCOM G4 PLATINUM RECEIVER) KIT USE AS DIRECTED 1 kit 1  . diphenoxylate-atropine (LOMOTIL) 2.5-0.025 MG per tablet TAKE ONE OR TWO TABLETS BY MOUTH TWICE DAILY AS NEEDED 60 tablet 0  . escitalopram (LEXAPRO) 20 MG tablet TAKE ONE TABLET BY MOUTH ONE TIME DAILY 30 tablet 5  . gabapentin (NEURONTIN) 600 MG tablet TAKE ONE TABLET BY MOUTH THREE TIMES DAILY  (Patient taking differently: Take 1/2 tablet daily) 90 tablet 1  . HYDROcodone-acetaminophen (NORCO/VICODIN) 5-325 MG per tablet Take one tablet by mouth five times daily as needed for pain 150 tablet 0  . Insulin Glargine (TOUJEO SOLOSTAR) 300 UNIT/ML SOPN Inject 11 Units into the skin at bedtime.     . insulin lispro (HUMALOG) 100 UNIT/ML KiwkPen Inject 1-6 Units into the skin daily as needed (100-199 = 1 units, 200-299 = 2 units, 300-399 = 3 units, 400-499 = 4 units, 500-599 = 5 units, 600+ = 6 units).     Marland Kitchen levothyroxine (SYNTHROID, LEVOTHROID) 75 MCG tablet TAKE ONE TABLET BY MOUTH ONE TIME DAILY  30 tablet 5  .  methylphenidate (RITALIN) 20 MG tablet Take 1 tablet (20 mg total) by mouth 2 (two) times daily. (Patient taking differently: Take 20 mg by mouth daily. ) 180 tablet 0  . metoCLOPramide (REGLAN) 5 MG tablet USE ONCE A DAY AFTER LUCHTIME MEAL 90 tablet 1  . metoprolol tartrate (LOPRESSOR) 25 MG tablet Take 0.5 tablets (12.5 mg total) by mouth daily. 30 tablet 2  . Multiple Vitamin (MULTIVITAMIN WITH MINERALS) TABS tablet Take 1 tablet by mouth daily. 30 tablet 3  . ONE TOUCH ULTRA TEST test strip TEST 8 TIMES PER DAY FOR 30 DAYS 250 each 4  . pantoprazole (PROTONIX) 40 MG tablet Take one tablet by mouth once daily for stomach (Patient taking differently: Take 40 mg by mouth daily. ) 30 tablet 5  . pravastatin (PRAVACHOL) 20 MG tablet Take 1 tablet (20 mg total) by mouth every evening. 90 tablet 3  . pregabalin (LYRICA) 50 MG capsule 1-2 capsules at bed to help neuropathic pain (Patient taking differently: Take 100 mg by mouth at bedtime. ) 60 capsule 5  . saccharomyces boulardii (FLORASTOR) 250 MG capsule Take 1 capsule (250 mg total) by mouth 2 (two) times daily. 60 capsule 0  . silver sulfADIAZINE (SILVADENE) 1 % cream Apply 1 application topically daily. 50 g 0  . testosterone cypionate (DEPOTESTOTERONE CYPIONATE) 200 MG/ML injection Inject 200 mg every 14 days 10 mL  0  . vitamin B-12 (CYANOCOBALAMIN) 1000 MCG tablet Take 1,000 mcg by mouth daily.    . ciprofloxacin (CIPRO) 250 MG tablet Take 1 tablet (250 mg total) by mouth 2 (two) times daily. (Patient not taking: Reported on 03/21/2015) 30 tablet 0  . clindamycin (CLEOCIN) 300 MG capsule Take 1 capsule (300 mg total) by mouth 3 (three) times daily. (Patient not taking: Reported on 03/21/2015) 45 capsule 0  . doxycycline (VIBRA-TABS) 100 MG tablet TAKE 1 TABLET (100 MG TOTAL) BY MOUTH 2 (TWO) TIMES DAILY.  0   No current facility-administered medications on file prior to visit.     Physical exam:  Filed Vitals:   03/21/15 0910  BP: 118/76   Pulse: 93  Temp: 97.6 F (36.4 C)  TempSrc: Oral  Resp: 18  Height: 5' 6"  (1.676 m)  Weight: 124 lb 8 oz (56.473 kg)  SpO2: 100%    Extremities: Absent popliteal and pedal pulses. Left foot fifth toe dry gangrene, kissing ulcer between left second and third toe, superficial debridement was performed on the left third toe today which shows a good bed of healing tissue beneath this  Assessment: Healing left foot wound status post atherectomy and angioplasty of left popliteal artery.  Plan: The patient will follow-up with me in a few weeks to make sure that the wounds in his foot are continuing to heal. Wound care currently we will be dry dressings to try to make sure the moisture between his toes dries out and these ulcers should continue to dry up on their own. He will need a repeat duplex exam in 3 months time to make sure that his popliteal artery is still patent.  Ruta Hinds, MD [facility] Office: [phone] Pager: [phone]

## 2015-03-25 ENCOUNTER — Ambulatory Visit: Payer: Medicare Other | Admitting: Podiatry

## 2015-03-27 ENCOUNTER — Other Ambulatory Visit: Payer: Self-pay

## 2015-03-27 MED ORDER — HYDROCODONE-ACETAMINOPHEN 5-325 MG PO TABS: ORAL_TABLET | ORAL | Status: DC

## 2015-03-28 ENCOUNTER — Other Ambulatory Visit: Payer: Medicare Other

## 2015-04-01 ENCOUNTER — Ambulatory Visit: Payer: Self-pay | Admitting: Internal Medicine

## 2015-04-02 ENCOUNTER — Ambulatory Visit: Payer: Self-pay | Admitting: Internal Medicine

## 2015-04-04 ENCOUNTER — Ambulatory Visit: Payer: Medicare Other | Attending: Vascular Surgery

## 2015-04-09 ENCOUNTER — Ambulatory Visit: Payer: Medicare Other | Admitting: Internal Medicine

## 2015-04-10 ENCOUNTER — Ambulatory Visit (INDEPENDENT_AMBULATORY_CARE_PROVIDER_SITE_OTHER): Payer: Medicare Other | Admitting: Internal Medicine

## 2015-04-10 ENCOUNTER — Encounter: Payer: Self-pay | Admitting: Internal Medicine

## 2015-04-10 VITALS — BP 134/78 | HR 62 | Temp 96.9°F | Ht 66.0 in | Wt 124.0 lb

## 2015-04-10 DIAGNOSIS — I739 Peripheral vascular disease, unspecified: Secondary | ICD-10-CM

## 2015-04-10 DIAGNOSIS — I679 Cerebrovascular disease, unspecified: Secondary | ICD-10-CM | POA: Diagnosis not present

## 2015-04-10 DIAGNOSIS — E1051 Type 1 diabetes mellitus with diabetic peripheral angiopathy without gangrene: Secondary | ICD-10-CM

## 2015-04-10 DIAGNOSIS — D649 Anemia, unspecified: Secondary | ICD-10-CM | POA: Diagnosis not present

## 2015-04-10 DIAGNOSIS — L03032 Cellulitis of left toe: Secondary | ICD-10-CM

## 2015-04-10 DIAGNOSIS — E039 Hypothyroidism, unspecified: Secondary | ICD-10-CM | POA: Diagnosis not present

## 2015-04-10 DIAGNOSIS — E1059 Type 1 diabetes mellitus with other circulatory complications: Secondary | ICD-10-CM | POA: Diagnosis not present

## 2015-04-10 DIAGNOSIS — E785 Hyperlipidemia, unspecified: Secondary | ICD-10-CM

## 2015-04-10 DIAGNOSIS — I10 Essential (primary) hypertension: Secondary | ICD-10-CM | POA: Diagnosis not present

## 2015-04-10 DIAGNOSIS — R569 Unspecified convulsions: Secondary | ICD-10-CM | POA: Insufficient documentation

## 2015-04-10 NOTE — Patient Instructions (Signed)
Increase Toujeo to 14 units nightly.

## 2015-04-10 NOTE — Progress Notes (Signed)
Patient ID: Benjamin Hodge, male   DOB: 12-12-1940, 74 y.o.   MRN: [id number]    Facility  [facility]    Place of Service:   OFFICE    Allergies  Allergen Reactions  . Lipitor [Atorvastatin] Other (See Comments)    Caused pain all over body.  . Ace Inhibitors Other (See Comments)    dizzy  . Angiotensin Receptor Blockers Other (See Comments)    unknown  . Bee Venom Swelling  . Cymbalta [Duloxetine Hcl] Other (See Comments)    dizzy    Chief Complaint  Patient presents with  . Medical Management of Chronic Issues    Medical Management of Chronic Issues. 3 Month follow up    HPI:   Hospitalized 02/18/2015 through 02/22/2015 for cellulitis of the toe. He underwent atherectomy of the left popliteal artery as well as angioplasty of the left popliteal artery with drug-coated balloon. This was done for nonhealing wounds in the left foot. Wounds have been healing, but appear now to have taken a turn for the worse with ulcerations between every toe of his foot, as well as dry gangrene of the fifth toe laterally. He denies claudication symptoms. Has been taking antibiotics in the past, but is currently off all of these. There is a greenish slough over the ulcerations between all toes. He denies pain, but has a known peripheral neuropathy.  Seizures: No prior history of seizures. Son describes an episode at his house where there was some absence episode that lasted somewhere between 2 and 5 minutes. Initial presentation with was with both hands held up rigidly and a rigid torso, and then he suddenly collapsed. During the episode with the hands were held up rigidly he could not be brought to attention and did not answer questions. He appeared to be staring into space. There was no loss of bladder or bowel control. When the patient woke up he did not recall any of this episode. He felt weak, but the family left him drive home because he seemed to have recovered quickly. A previous brain scan in 2005  showed multiple areas of previous infarcts.  Cerebrovascular disease: CT of the head without contrast 06/09/2004 disclose multiple faint periventricular white matter lucencies as well as no lacunar infarct in the left putamen. There was also a vague area of lucency in the right centrum semiovale in the right frontoparietal region which could be a subacute infarct.  Diabetes mellitus type 1 with peripheral artery disease: Poorly controlled. Patient is on both insulin long-acting basal as well as short acting. He is aware that his blood sugars out of control and says they recently were over 400 mg percent. He does not adjust his insulin himself.  Essential hypertension: Controlled  Cellulitis of toe of left foot: Multiple areas of ulceration with green slough in between the toes. Great toe appears to have recovered from previous infection. There is an area of dry gangrene of the left fifth toe.  Dyslipidemia: Needs further follow-up.    Medications: Patient's Medications  New Prescriptions   No medications on file  Previous Medications   AMLODIPINE (NORVASC) 2.5 MG TABLET    Take 2.5 mg by mouth daily.   ASPIRIN 81 MG TABLET    Take 81 mg by mouth daily.    CIPROFLOXACIN (CIPRO) 250 MG TABLET    Take 1 tablet (250 mg total) by mouth 2 (two) times daily.   CLOPIDOGREL (PLAVIX) 75 MG TABLET    TAKE 1 TABLET BY MOUTH  DAILY WITH BREAKFAST   CONTINUOUS GLUCOSE MONITOR (DEXCOM G4 PLATINUM RECEIVER) KIT    USE AS DIRECTED   DIPHENOXYLATE-ATROPINE (LOMOTIL) 2.5-0.025 MG PER TABLET    TAKE ONE OR TWO TABLETS BY MOUTH TWICE DAILY AS NEEDED   DOXYCYCLINE (VIBRA-TABS) 100 MG TABLET    TAKE 1 TABLET (100 MG TOTAL) BY MOUTH 2 (TWO) TIMES DAILY.   ESCITALOPRAM (LEXAPRO) 20 MG TABLET    TAKE ONE TABLET BY MOUTH ONE TIME DAILY   GABAPENTIN (NEURONTIN) 600 MG TABLET    TAKE ONE TABLET BY MOUTH THREE TIMES DAILY    HYDROCODONE-ACETAMINOPHEN (NORCO/VICODIN) 5-325 MG PER TABLET    Take one tablet by mouth five  times daily as needed for pain   INSULIN GLARGINE (TOUJEO SOLOSTAR) 300 UNIT/ML SOPN    Inject 11 Units into the skin at bedtime.    INSULIN LISPRO (HUMALOG) 100 UNIT/ML KIWKPEN    Inject 1-6 Units into the skin daily as needed (100-199 = 1 units, 200-299 = 2 units, 300-399 = 3 units, 400-499 = 4 units, 500-599 = 5 units, 600+ = 6 units).    LEVOTHYROXINE (SYNTHROID, LEVOTHROID) 75 MCG TABLET    TAKE ONE TABLET BY MOUTH ONE TIME DAILY    METHYLPHENIDATE (RITALIN) 20 MG TABLET    Take 1 tablet (20 mg total) by mouth 2 (two) times daily.   METOCLOPRAMIDE (REGLAN) 5 MG TABLET    USE ONCE A DAY AFTER LUCHTIME MEAL   METOPROLOL TARTRATE (LOPRESSOR) 25 MG TABLET    Take 0.5 tablets (12.5 mg total) by mouth daily.   MULTIPLE VITAMIN (MULTIVITAMIN WITH MINERALS) TABS TABLET    Take 1 tablet by mouth daily.   ONE TOUCH ULTRA TEST TEST STRIP    TEST 8 TIMES PER DAY FOR 30 DAYS   PANTOPRAZOLE (PROTONIX) 40 MG TABLET    Take one tablet by mouth once daily for stomach   PRAVASTATIN (PRAVACHOL) 20 MG TABLET    Take 1 tablet (20 mg total) by mouth every evening.   PREGABALIN (LYRICA) 50 MG CAPSULE    1-2 capsules at bed to help neuropathic pain   SACCHAROMYCES BOULARDII (FLORASTOR) 250 MG CAPSULE    Take 1 capsule (250 mg total) by mouth 2 (two) times daily.   SILVER SULFADIAZINE (SILVADENE) 1 % CREAM    Apply 1 application topically daily.   TESTOSTERONE CYPIONATE (DEPOTESTOTERONE CYPIONATE) 200 MG/ML INJECTION    Inject 200 mg every 14 days   VITAMIN B-12 (CYANOCOBALAMIN) 1000 MCG TABLET    Take 1,000 mcg by mouth daily.  Modified Medications   No medications on file  Discontinued Medications   CLINDAMYCIN (CLEOCIN) 300 MG CAPSULE    Take 1 capsule (300 mg total) by mouth 3 (three) times daily.     Review of Systems  Constitutional: Negative for fever, activity change, appetite change, fatigue and unexpected weight change.       Recent fall  HENT: Negative for ear pain, hearing loss, mouth sores, sore  throat and tinnitus.   Eyes: Negative.   Respiratory: Negative for cough, choking, shortness of breath and wheezing.   Cardiovascular: Negative for chest pain, palpitations and leg swelling.  Gastrointestinal: Negative for diarrhea.  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  Skin: Positive for wound (Ulcerations green slough between all toes of the left foot).  Allergic/Immunologic: Negative.   Neurological: Positive for weakness and numbness. Negative for dizziness, tremors and headaches.  Psychiatric/Behavioral: Positive for confusion. Negative for hallucinations, behavioral problems, dysphoric mood and agitation. The patient is hyperactive.     Filed Vitals:   04/10/15 1600  BP: 134/78  Pulse: 62  Temp: 96.9 F (36.1 C)  TempSrc: Oral  Height: 5' 6"  (1.676 m)  Weight: 124 lb (56.246 kg)   Body mass index is 20.02 kg/(m^2).  Physical Exam  Constitutional: He is oriented to person, place, and time. No distress.  Thin and frail  HENT:  Head: Normocephalic.  Right Ear: External ear normal.  Left Ear: External ear normal.  Nose: Nose normal.  Mouth/Throat: Oropharynx is clear and moist. No oropharyngeal exudate.  Eyes: Conjunctivae and EOM are normal. Pupils are equal, round, and reactive to light.  History of nonproliferative diabetic retinopathy.  Neck: No JVD present. No tracheal deviation present. No thyromegaly present.  Cardiovascular: Normal rate, regular rhythm and normal heart sounds.  Exam reveals no friction rub.   No murmur heard. Diminished pulsations in dorsalis pedis and posterior tibial arteries bilaterally  Pulmonary/Chest: No respiratory distress. He has no wheezes. He has no rales. He exhibits no tenderness.  Abdominal: He exhibits no distension and no mass. There is no tenderness.  Genitourinary: Left testis shows tenderness.  Musculoskeletal: Normal range of motion. He exhibits  tenderness. He exhibits no edema.  Lymphadenopathy:    He has no cervical adenopathy.  Neurological: He is oriented to person, place, and time. He has normal reflexes. No cranial nerve deficit. Coordination normal.  Diminished sensation to vibration and monofilament testing bilaterally. Forgetful.  Skin: No rash noted. No erythema. No pallor.  Ulcerations and infection between the toes of the left foot.  Psychiatric: He has a normal mood and affect. His behavior is normal. Thought content normal.     Labs reviewed: Admission on 02/18/2015, Discharged on 02/22/2015  No results displayed because visit has over 200 results.       Assessment/Plan  1. Seizures New onset - CT Head Wo Contrast; Future - EEG; Future -Recommend no driving  2. Cerebrovascular disease Documented on previous CT of the head in 2005. CT should be repeated to see if there are new areas that could contribute to recent onset of seizures.  3. Diabetes mellitus type 1 with peripheral artery disease Increased Toujeo to 14 units nightly - CMP  4. Essential hypertension Controlled - CMP  5. Cellulitis of toe of left foot Resume dressing with SSD cream daily - Aerobic Culture  6. Dyslipidemia Follow-up lipids in the future  7. Hypothyroidism, unspecified hypothyroidism type - TSH  8. PVD (peripheral vascular disease) Continue to see Dr. Oneida Alar  9. Anemia, unspecified anemia type - CBC With Differential

## 2015-04-11 LAB — COMPREHENSIVE METABOLIC PANEL
A/G RATIO: 1.6 (ref 1.1–2.5)
ALK PHOS: 94 IU/L (ref 39–117)
ALT: 12 IU/L (ref 0–44)
AST: 14 IU/L (ref 0–40)
Albumin: 3.9 g/dL (ref 3.5–4.8)
BUN / CREAT RATIO: 29 — AB (ref 10–22)
BUN: 40 mg/dL — ABNORMAL HIGH (ref 8–27)
CHLORIDE: 96 mmol/L — AB (ref 97–108)
CO2: 27 mmol/L (ref 18–29)
Calcium: 9.3 mg/dL (ref 8.6–10.2)
Creatinine, Ser: 1.38 mg/dL — ABNORMAL HIGH (ref 0.76–1.27)
GFR calc Af Amer: 59 mL/min/{1.73_m2} — ABNORMAL LOW (ref >59)
GFR calc non Af Amer: 51 mL/min/{1.73_m2} — ABNORMAL LOW (ref >59)
GLUCOSE: 328 mg/dL — AB (ref 65–99)
Globulin, Total: 2.4 g/dL (ref 1.5–4.5)
Potassium: 6 mmol/L — ABNORMAL HIGH (ref 3.5–5.2)
Sodium: 138 mmol/L (ref 134–144)
TOTAL PROTEIN: 6.3 g/dL (ref 6.0–8.5)

## 2015-04-11 LAB — CBC WITH DIFFERENTIAL
BASOS ABS: 0 10*3/uL (ref 0.0–0.2)
BASOS: 1 %
EOS (ABSOLUTE): 0.2 10*3/uL (ref 0.0–0.4)
Eos: 2 %
Hematocrit: 31.2 % — ABNORMAL LOW (ref 37.5–51.0)
Hemoglobin: 10 g/dL — ABNORMAL LOW (ref 12.6–17.7)
Immature Grans (Abs): 0 10*3/uL (ref 0.0–0.1)
Immature Granulocytes: 0 %
LYMPHS ABS: 0.9 10*3/uL (ref 0.7–3.1)
Lymphs: 11 %
MCH: 31.3 pg (ref 26.6–33.0)
MCHC: 32.1 g/dL (ref 31.5–35.7)
MCV: 98 fL — ABNORMAL HIGH (ref 79–97)
Monocytes Absolute: 0.6 10*3/uL (ref 0.1–0.9)
Monocytes: 8 %
Neutrophils Absolute: 6.3 10*3/uL (ref 1.4–7.0)
Neutrophils: 78 %
RBC: 3.2 x10E6/uL — AB (ref 4.14–5.80)
RDW: 13.5 % (ref 12.3–15.4)
WBC: 8 10*3/uL (ref 3.4–10.8)

## 2015-04-11 LAB — TSH: TSH: 0.964 u[IU]/mL (ref 0.450–4.500)

## 2015-04-12 LAB — AEROBIC CULTURE

## 2015-04-22 ENCOUNTER — Ambulatory Visit (INDEPENDENT_AMBULATORY_CARE_PROVIDER_SITE_OTHER): Payer: Medicare Other | Admitting: Neurology

## 2015-04-22 DIAGNOSIS — R569 Unspecified convulsions: Secondary | ICD-10-CM | POA: Diagnosis not present

## 2015-04-23 ENCOUNTER — Other Ambulatory Visit: Payer: Self-pay | Admitting: Cardiology

## 2015-04-23 NOTE — Telephone Encounter (Signed)
SPOKE TO PHARMACIST PHARMACIST STATES PATIENT DAUGHTER IS ASSISTING HIM WITH MEDICATIONS. QUESTION IF PATIENT SHOULD BE TAKING AMLODIPINE  5 MG AND 2.5 MG TO EQUAL 7.5 MG.  PATIENT HAS ONLY FILLED 2.5 MG OF AMLODIPINE SINCE 09/2014 AND NOT THE 5 MG TABLETS. UNABLE TO CLARIFY MEDICATION DOSAGE FROM PATIENT'S OFFICE NOTES.  WILL DEFER [facility] ? PHARMACIST IS AWARE AND WILL CONTACT WITH INFORMATIONS.

## 2015-04-24 ENCOUNTER — Encounter: Payer: Self-pay | Admitting: Vascular Surgery

## 2015-04-24 NOTE — Telephone Encounter (Signed)
Please restart on 2.5 mg daily and he will need to keep a BP diary.

## 2015-04-25 ENCOUNTER — Ambulatory Visit: Payer: Medicare Other | Admitting: Vascular Surgery

## 2015-04-25 NOTE — Telephone Encounter (Signed)
Thayer Ohm called in wanting to get clarification on the pt's Amlodipine medication. Please call  Thanks

## 2015-04-25 NOTE — Telephone Encounter (Signed)
Pharmacy message still states not open - left message on prescriber line voice mail w/ details & recommendation.

## 2015-04-25 NOTE — Telephone Encounter (Signed)
Called pharmacy to give physician recommendations. Pharmacy not open. Will reattempt later.

## 2015-04-26 NOTE — Procedures (Signed)
[facility]  EEG (ELECTROENCEPHALOGRAM) REPORT   STUDY DATE:   04-22-15  PATIENT NAME: Benjamin Hodge   ORDERING CLINICIAN: Melvyn Novas, MD   TECHNOLOGIST: Yetta Barre  TECHNIQUE: Electroencephalogram was recorded utilizing standard 10-20 system of lead placement and reformatted into average and bipolar montages.      RECORDING TIME:  40 minutes   hyperventilation    CLINICAL INFORMATION: Referral to primary care physician for evaluation of seizures.  [name] reported on 04-10-15 to his  primary care physician Dr. Murray Hodgkins that he seizures, new onset.    FINDINGS: A posterior dominant background rhythm of 8 Hz emitted symmetrically from both occipital hemispheres.  Amplitude was symmetric, there were no focal epileptiform discharges, rhythmic or periodic in nature.  There was frontal polar significant motion artifact noted.  (The patient seemed to have his jaw clenched and his forehead muscles appear tense as well, annotation by Technician) . This may produce such a motion artifact.   The patient became drowsy during this reporting but I did not see him enter sleep. His cardiac rate was 66 bpm.  Photic stimulation had to be deferred and hyperventilation was deferred due to his but this patient's pulmonary health history.    IMPRESSION:  EEG is normal for age and gender.   Melvyn Novas, MD       Melvyn Novas , MD

## 2015-04-29 ENCOUNTER — Other Ambulatory Visit: Payer: Self-pay | Admitting: *Deleted

## 2015-04-29 MED ORDER — HYDROCODONE-ACETAMINOPHEN 5-325 MG PO TABS: ORAL_TABLET | ORAL | Status: DC

## 2015-04-29 NOTE — Telephone Encounter (Signed)
Patient requested and will pick up 

## 2015-04-30 ENCOUNTER — Ambulatory Visit
Admission: RE | Admit: 2015-04-30 | Discharge: 2015-04-30 | Disposition: A | Discharge status: Home / Self Care | Payer: Medicare Other | Source: Ambulatory Visit | Attending: Internal Medicine | Admitting: Internal Medicine

## 2015-04-30 DIAGNOSIS — R569 Unspecified convulsions: Secondary | ICD-10-CM

## 2015-04-30 DIAGNOSIS — R42 Dizziness and giddiness: Secondary | ICD-10-CM | POA: Diagnosis not present

## 2015-04-30 DIAGNOSIS — Z8673 Personal history of transient ischemic attack (TIA), and cerebral infarction without residual deficits: Secondary | ICD-10-CM | POA: Diagnosis not present

## 2015-05-01 ENCOUNTER — Ambulatory Visit (INDEPENDENT_AMBULATORY_CARE_PROVIDER_SITE_OTHER): Payer: Medicare Other | Admitting: Internal Medicine

## 2015-05-01 VITALS — BP 150/80 | HR 68 | Temp 97.4°F | Resp 16 | Ht 66.0 in | Wt 129.6 lb

## 2015-05-01 DIAGNOSIS — G40A09 Absence epileptic syndrome, not intractable, without status epilepticus: Secondary | ICD-10-CM

## 2015-05-01 DIAGNOSIS — L299 Pruritus, unspecified: Secondary | ICD-10-CM | POA: Diagnosis not present

## 2015-05-01 DIAGNOSIS — E538 Deficiency of other specified B group vitamins: Secondary | ICD-10-CM | POA: Diagnosis not present

## 2015-05-01 DIAGNOSIS — E1065 Type 1 diabetes mellitus with hyperglycemia: Principal | ICD-10-CM

## 2015-05-01 DIAGNOSIS — E1051 Type 1 diabetes mellitus with diabetic peripheral angiopathy without gangrene: Secondary | ICD-10-CM

## 2015-05-01 DIAGNOSIS — R195 Other fecal abnormalities: Secondary | ICD-10-CM | POA: Diagnosis not present

## 2015-05-01 DIAGNOSIS — E1042 Type 1 diabetes mellitus with diabetic polyneuropathy: Secondary | ICD-10-CM | POA: Diagnosis not present

## 2015-05-01 DIAGNOSIS — L03032 Cellulitis of left toe: Secondary | ICD-10-CM | POA: Diagnosis not present

## 2015-05-01 DIAGNOSIS — E1059 Type 1 diabetes mellitus with other circulatory complications: Secondary | ICD-10-CM

## 2015-05-01 DIAGNOSIS — E1041 Type 1 diabetes mellitus with diabetic mononeuropathy: Secondary | ICD-10-CM | POA: Diagnosis not present

## 2015-05-01 DIAGNOSIS — G990 Autonomic neuropathy in diseases classified elsewhere: Secondary | ICD-10-CM | POA: Diagnosis not present

## 2015-05-01 DIAGNOSIS — D649 Anemia, unspecified: Secondary | ICD-10-CM

## 2015-05-01 DIAGNOSIS — I10 Essential (primary) hypertension: Secondary | ICD-10-CM

## 2015-05-01 DIAGNOSIS — E1043 Type 1 diabetes mellitus with diabetic autonomic (poly)neuropathy: Secondary | ICD-10-CM

## 2015-05-01 DIAGNOSIS — IMO0002 Reserved for concepts with insufficient information to code with codable children: Secondary | ICD-10-CM

## 2015-05-01 DIAGNOSIS — I739 Peripheral vascular disease, unspecified: Secondary | ICD-10-CM

## 2015-05-01 DIAGNOSIS — I679 Cerebrovascular disease, unspecified: Secondary | ICD-10-CM

## 2015-05-01 DIAGNOSIS — E1049 Type 1 diabetes mellitus with other diabetic neurological complication: Secondary | ICD-10-CM

## 2015-05-04 ENCOUNTER — Encounter: Payer: Self-pay | Admitting: Internal Medicine

## 2015-05-04 DIAGNOSIS — L299 Pruritus, unspecified: Secondary | ICD-10-CM | POA: Insufficient documentation

## 2015-05-04 DIAGNOSIS — R195 Other fecal abnormalities: Secondary | ICD-10-CM | POA: Insufficient documentation

## 2015-05-04 DIAGNOSIS — G40A09 Absence epileptic syndrome, not intractable, without status epilepticus: Secondary | ICD-10-CM | POA: Insufficient documentation

## 2015-05-04 DIAGNOSIS — E538 Deficiency of other specified B group vitamins: Secondary | ICD-10-CM | POA: Insufficient documentation

## 2015-05-04 NOTE — Progress Notes (Signed)
Patient ID: Benjamin Hodge, male   DOB: 09-Nov-1940, 74 y.o.   MRN: [id number]    Facility  [facility]    Place of Service:   OFFICE    Allergies  Allergen Reactions  . Lipitor [Atorvastatin] Other (See Comments)    Caused pain all over body.  . Ace Inhibitors Other (See Comments)    dizzy  . Angiotensin Receptor Blockers Other (See Comments)    unknown  . Bee Venom Swelling  . Cymbalta [Duloxetine Hcl] Other (See Comments)    dizzy    Chief Complaint  Patient presents with  . Medical Management of Chronic Issues    3 week follow-up,for hypertension,DM    HPI:  Patient returns today for review of recent tests including lab, MRI of the brain, and EEG. He has had no further episodes of staring or seizure-like activity. The EEG was normal for age and gender. The MRI of the brain showed an old left basal ganglia lacunar infarct, cerebral atrophy, and small vessel ischemic changes. Lab work disclosed his continuing issues with a chronic anemia of uncertain etiology. Hemoglobin was 10. Glucose was quite elevated on a nonfasting specimen at 328 mg percent . Renal insufficiency was stable with a creatinine 1.38. Potassium was slightly high at 6.0.  Type 1 diabetes mellitus with neurological manifestations, uncontrolled: We continued his current medications.  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 Continue current medications  Anemia, unspecified anemia type: Follow-up lab prior to next visit  Cellulitis of toe of left foot: Steady improvement in the previous cellulitis. There are still some areas to be healed at the fifth toe and between the third and fourth digits.  Pruritus of scalp - generalized itching which is intermittent.  Loose stools - patient would  like advice for loose stools. He does not have diarrhea with multiple stools. There has been no blood, cramping, or abdominal pain.   Medications: Patient's Medications  New Prescriptions   No medications on file  Previous Medications   AMLODIPINE (NORVASC) 2.5 MG TABLET    Take 2.5 mg by mouth daily.   ASPIRIN 81 MG TABLET    Take 81 mg by mouth daily.    CIPROFLOXACIN (CIPRO) 250 MG TABLET    Take 1 tablet (250 mg total) by mouth 2 (two) times daily.   CLOPIDOGREL (PLAVIX) 75 MG TABLET    TAKE 1 TABLET BY MOUTH DAILY WITH BREAKFAST   CONTINUOUS GLUCOSE MONITOR (DEXCOM G4 PLATINUM RECEIVER) KIT    USE AS DIRECTED   DIPHENOXYLATE-ATROPINE (LOMOTIL) 2.5-0.025 MG PER TABLET    TAKE ONE OR TWO TABLETS BY MOUTH TWICE DAILY AS NEEDED   DOXYCYCLINE (VIBRA-TABS) 100 MG TABLET    TAKE 1 TABLET (100 MG TOTAL) BY MOUTH 2 (TWO) TIMES DAILY.   ESCITALOPRAM (LEXAPRO) 20 MG TABLET    TAKE ONE TABLET BY MOUTH ONE TIME DAILY   GABAPENTIN (NEURONTIN) 600 MG TABLET    TAKE ONE TABLET BY MOUTH THREE TIMES DAILY    HYDROCODONE-ACETAMINOPHEN (NORCO/VICODIN) 5-325 MG PER TABLET    Take one tablet by mouth five times daily as needed for pain   INSULIN GLARGINE (TOUJEO SOLOSTAR) 300 UNIT/ML SOPN    Inject 14 Units into the skin at bedtime.   INSULIN LISPRO (HUMALOG) 100 UNIT/ML KIWKPEN    Inject 1-6 Units into the skin daily  as needed (100-199 = 1 units, 200-299 = 2 units, 300-399 = 3 units, 400-499 = 4 units, 500-599 = 5 units, 600+ = 6 units).    LEVOTHYROXINE (SYNTHROID, LEVOTHROID) 75 MCG TABLET    TAKE ONE TABLET BY MOUTH ONE TIME DAILY    METHYLPHENIDATE (RITALIN) 20 MG TABLET    Take 1 tablet (20 mg total) by mouth 2 (two) times daily.   METOCLOPRAMIDE (REGLAN) 5 MG TABLET    USE ONCE A DAY AFTER LUCHTIME MEAL   METOPROLOL TARTRATE (LOPRESSOR) 25 MG TABLET    Take 0.5 tablets (12.5 mg total) by mouth daily.   MULTIPLE VITAMIN (MULTIVITAMIN WITH MINERALS) TABS TABLET    Take 1 tablet by mouth daily.   ONE  TOUCH ULTRA TEST TEST STRIP    TEST 8 TIMES PER DAY FOR 30 DAYS   PANTOPRAZOLE (PROTONIX) 40 MG TABLET    Take one tablet by mouth once daily for stomach   PRAVASTATIN (PRAVACHOL) 20 MG TABLET    Take 1 tablet (20 mg total) by mouth every evening.   PREGABALIN (LYRICA) 50 MG CAPSULE    1-2 capsules at bed to help neuropathic pain   SACCHAROMYCES BOULARDII (FLORASTOR) 250 MG CAPSULE    Take 1 capsule (250 mg total) by mouth 2 (two) times daily.   SILVER SULFADIAZINE (SILVADENE) 1 % CREAM    Apply 1 application topically daily.   TESTOSTERONE CYPIONATE (DEPOTESTOTERONE CYPIONATE) 200 MG/ML INJECTION    Inject 200 mg every 14 days   VITAMIN B-12 (CYANOCOBALAMIN) 1000 MCG TABLET    Take 1,000 mcg by mouth daily.  Modified Medications   No medications on file  Discontinued Medications   No medications on file     Review of Systems  Constitutional: Negative for fever, activity change, appetite change, fatigue and unexpected weight change.       Recent fall  HENT: Negative for ear pain, hearing loss, mouth sores, sore throat and tinnitus.   Eyes: Negative.   Respiratory: Negative for cough, choking, shortness of breath and wheezing.   Cardiovascular: Negative for chest pain, palpitations and leg swelling.  Gastrointestinal: Negative for diarrhea.  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  Skin: Positive for wound (healing or improvement in the ulcerations between the toes of the left foot).  Allergic/Immunologic: Negative.   Neurological: Positive for weakness and numbness. Negative for dizziness, tremors and headaches.       Absence episode with rolling of the eyes and some posturing in early August 2016.  Psychiatric/Behavioral: Positive for confusion. Negative for hallucinations, behavioral problems, dysphoric mood and agitation. The patient is hyperactive.     Filed Vitals:   05/04/15 1112  BP: 150/80  Pulse:  68  Temp: 97.4 F (36.3 C)  Resp: 16  Height: _0  (1.676 m)  Weight: 129 lb 9.6 oz (58.786 kg)  SpO2: 97%   Body mass index is 20.93 kg/(m^2).  Physical Exam  Constitutional: He is oriented to person, place, and time. No distress.  Thin and frail  HENT:  Head: Normocephalic.  Right Ear: External ear normal.  Left Ear: External ear normal.  Nose: Nose normal.  Mouth/Throat: Oropharynx is clear and moist. No oropharyngeal exudate.  Eyes: Conjunctivae and EOM are normal. Pupils are equal, round, and reactive to light.  History of nonproliferative diabetic retinopathy.  Neck: No JVD present. No tracheal deviation present. No thyromegaly present.  Cardiovascular: Normal rate, regular  rhythm and normal heart sounds.  Exam reveals no friction rub.   No murmur heard. Diminished pulsations in dorsalis pedis and posterior tibial arteries bilaterally  Pulmonary/Chest: No respiratory distress. He has no wheezes. He has no rales. He exhibits no tenderness.  Abdominal: He exhibits no distension and no mass. There is no tenderness.  Musculoskeletal: Normal range of motion. He exhibits tenderness. He exhibits no edema.  Lymphadenopathy:    He has no cervical adenopathy.  Neurological: He is oriented to person, place, and time. He has normal reflexes. No cranial nerve deficit. Coordination normal.  Diminished sensation to vibration and monofilament testing bilaterally. Forgetful.  Skin: No rash noted. No erythema. No pallor.  Ulcerations between the toes of the left foot have healed except for a persistent ulceration between the third and fourth toes.  Psychiatric: He has a normal mood and affect. His behavior is normal. Thought content normal.     Labs reviewed: Lab Summary Latest Ref Rng 04/10/2015 02/22/2015 02/21/2015 02/21/2015 02/20/2015  Hemoglobin 12.6 - 17.7 g/dL 10.0(L) 9.2(L) (None) 9.6(L) 10.1(L)  Hematocrit 37.5 - 51.0 % 31.2(L) 27.4(L) (None) 27.8(L) 30.3(L)  White count 3.4 -  10.8 x10E3/uL 8.0 7.8 (None) 7.7 6.6  Platelet count 150 - 400 K/uL (None) 198 (None) 218 228  Sodium 134 - 144 mmol/L 138 134(L) 136 137 139  Potassium 3.5 - 5.2 mmol/L 6.0(H) 4.2 4.0 4.2 3.8  Calcium 8.6 - 10.2 mg/dL 9.3 8.2(L) 8.2(L) 8.5(L) 8.6(L)  Phosphorus - (None) (None) (None) (None) (None)  Creatinine 0.76 - 1.27 mg/dL 1.38(H) 1.84(H) 1.71(H) 1.69(H) 1.31(H)  AST 0 - 40 IU/L 14 (None) (None) (None) (None)  Alk Phos 39 - 117 IU/L 94 (None) (None) (None) (None)  Bilirubin 0.0 - 1.2 mg/dL <0.2 (None) (None) (None) (None)  Glucose 65 - 99 mg/dL 328(H) 206(H) 348(H) 139(H) 116(H)  Cholesterol - (None) (None) (None) (None) (None)  HDL cholesterol - (None) (None) (None) (None) (None)  Triglycerides - (None) (None) (None) (None) (None)  LDL Direct - (None) (None) (None) (None) (None)  LDL Calc - (None) (None) (None) (None) (None)  Total protein - (None) (None) (None) (None) (None)  Albumin 3.5 - 4.8 g/dL 3.9 (None) (None) (None) (None)      Assessment/Plan 1. Type 1 diabetes mellitus with neurological manifestations, uncontrolled Unchanged - Hemoglobin A1c; Future  2. 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 Diabetes mellitus type 1 with peripheral artery disease Unchanged. [facility] - Basic Metabolic Panel - Hemoglobin A1c; Future  4. Essential hypertension Continue current medications - Basic Metabolic Panel  5. Anemia, unspecified anemia type Follow-up with additional lab next visit - CBC with Differential - Reticulocytes - IBC panel; Future  6. Cellulitis of toe of left foot Improved. Continue current treatment with daily washing and application of SSD cream  7. Pruritus of scalp Try Benadryl gel at night or 1% hydrocortisone cream and then shampoo on the following day.  8. Loose stools Take Florastor as previously directed at least once  daily  9. Absence epileptic syndrome, not intractable, without status epilepticus Absence episodes are highly suggestive of seizure-like activity in this patient, but this diagnosis is not proven. I have elected not to give anti-seizure medications at this point.  10. Cerebrovascular disease Documented on MRI of the brain 04/30/2015  11. B12 deficiency -B12 level, future Continue oral supplements 1000 g B12 daily

## 2015-05-06 ENCOUNTER — Other Ambulatory Visit: Payer: Self-pay | Admitting: *Deleted

## 2015-05-09 ENCOUNTER — Other Ambulatory Visit: Payer: Self-pay | Admitting: Internal Medicine

## 2015-05-10 ENCOUNTER — Other Ambulatory Visit: Payer: Self-pay

## 2015-05-10 DIAGNOSIS — Z794 Long term (current) use of insulin: Secondary | ICD-10-CM | POA: Diagnosis not present

## 2015-05-10 DIAGNOSIS — E1042 Type 1 diabetes mellitus with diabetic polyneuropathy: Secondary | ICD-10-CM | POA: Diagnosis not present

## 2015-05-10 DIAGNOSIS — F1020 Alcohol dependence, uncomplicated: Secondary | ICD-10-CM | POA: Diagnosis not present

## 2015-05-10 DIAGNOSIS — I739 Peripheral vascular disease, unspecified: Secondary | ICD-10-CM | POA: Diagnosis not present

## 2015-05-10 DIAGNOSIS — E1065 Type 1 diabetes mellitus with hyperglycemia: Secondary | ICD-10-CM | POA: Diagnosis not present

## 2015-05-10 MED ORDER — LEVOTHYROXINE SODIUM 75 MCG PO TABS: 75.0000 ug | ORAL_TABLET | Freq: Every day | ORAL | Status: AC

## 2015-05-12 ENCOUNTER — Other Ambulatory Visit: Payer: Self-pay | Admitting: Internal Medicine

## 2015-05-14 DIAGNOSIS — E161 Other hypoglycemia: Secondary | ICD-10-CM | POA: Diagnosis not present

## 2015-05-14 DIAGNOSIS — E162 Hypoglycemia, unspecified: Secondary | ICD-10-CM | POA: Diagnosis not present

## 2015-05-16 ENCOUNTER — Other Ambulatory Visit: Payer: Self-pay

## 2015-05-16 MED ORDER — METOCLOPRAMIDE HCL 5 MG PO TABS: ORAL_TABLET | ORAL | Status: DC

## 2015-05-27 ENCOUNTER — Emergency Department (HOSPITAL_COMMUNITY)
Admission: EM | Admit: 2015-05-27 | Discharge: 2015-05-27 | Discharge status: Against Medical Advice | Payer: Medicare Other | Attending: Emergency Medicine | Admitting: Emergency Medicine

## 2015-05-27 ENCOUNTER — Encounter (HOSPITAL_COMMUNITY): Payer: Self-pay | Admitting: *Deleted

## 2015-05-27 DIAGNOSIS — N189 Chronic kidney disease, unspecified: Secondary | ICD-10-CM | POA: Diagnosis not present

## 2015-05-27 DIAGNOSIS — E119 Type 2 diabetes mellitus without complications: Secondary | ICD-10-CM | POA: Diagnosis not present

## 2015-05-27 DIAGNOSIS — J449 Chronic obstructive pulmonary disease, unspecified: Secondary | ICD-10-CM | POA: Insufficient documentation

## 2015-05-27 DIAGNOSIS — I129 Hypertensive chronic kidney disease with stage 1 through stage 4 chronic kidney disease, or unspecified chronic kidney disease: Secondary | ICD-10-CM | POA: Insufficient documentation

## 2015-05-27 DIAGNOSIS — R42 Dizziness and giddiness: Secondary | ICD-10-CM | POA: Insufficient documentation

## 2015-05-27 DIAGNOSIS — G8929 Other chronic pain: Secondary | ICD-10-CM | POA: Insufficient documentation

## 2015-05-27 DIAGNOSIS — I2510 Atherosclerotic heart disease of native coronary artery without angina pectoris: Secondary | ICD-10-CM | POA: Insufficient documentation

## 2015-05-27 DIAGNOSIS — I959 Hypotension, unspecified: Secondary | ICD-10-CM | POA: Insufficient documentation

## 2015-05-27 DIAGNOSIS — I252 Old myocardial infarction: Secondary | ICD-10-CM | POA: Insufficient documentation

## 2015-05-27 LAB — CBC
HEMATOCRIT: 32.8 % — AB (ref 39.0–52.0)
HEMOGLOBIN: 10.6 g/dL — AB (ref 13.0–17.0)
MCH: 31.5 pg (ref 26.0–34.0)
MCHC: 32.3 g/dL (ref 30.0–36.0)
MCV: 97.6 fL (ref 78.0–100.0)
PLATELETS: 205 10*3/uL (ref 150–400)
RBC: 3.36 MIL/uL — AB (ref 4.22–5.81)
RDW: 13.8 % (ref 11.5–15.5)
WBC: 8.9 10*3/uL (ref 4.0–10.5)

## 2015-05-27 LAB — BASIC METABOLIC PANEL
Anion gap: 9 (ref 5–15)
BUN: 30 mg/dL — ABNORMAL HIGH (ref 6–20)
CHLORIDE: 104 mmol/L (ref 101–111)
CO2: 28 mmol/L (ref 22–32)
CREATININE: 1.64 mg/dL — AB (ref 0.61–1.24)
Calcium: 9.5 mg/dL (ref 8.9–10.3)
GFR calc non Af Amer: 40 mL/min — ABNORMAL LOW (ref >60)
GFR, EST AFRICAN AMERICAN: 47 mL/min — AB (ref >60)
Glucose, Bld: 171 mg/dL — ABNORMAL HIGH (ref 65–99)
Potassium: 4.8 mmol/L (ref 3.5–5.1)
Sodium: 141 mmol/L (ref 135–145)

## 2015-05-27 LAB — CBG MONITORING, ED: Glucose-Capillary: 173 mg/dL — ABNORMAL HIGH (ref 65–99)

## 2015-05-27 NOTE — ED Notes (Signed)
Pt states he takes multiple blood pressure medications.

## 2015-05-27 NOTE — ED Notes (Signed)
Pt was at his MD office today. Upon standing pt became very dizzy, office checked his BP and it was 102/54 and advised pt to come to the ED. HR was 59. Pt denies dizziness currently. Daughter in law states that pt has been falling at home.

## 2015-05-27 NOTE — ED Notes (Signed)
Pt's BS check with [facility] and offered a Happy Meal.  Pt. Is a diabetic.

## 2015-05-29 ENCOUNTER — Ambulatory Visit: Payer: Medicare Other | Admitting: Neurology

## 2015-05-29 ENCOUNTER — Ambulatory Visit (INDEPENDENT_AMBULATORY_CARE_PROVIDER_SITE_OTHER): Payer: Medicare Other | Admitting: Internal Medicine

## 2015-05-29 ENCOUNTER — Encounter: Payer: Self-pay | Admitting: Internal Medicine

## 2015-05-29 VITALS — BP 118/66 | HR 68 | Temp 97.3°F | Resp 20 | Ht 66.0 in | Wt 127.2 lb

## 2015-05-29 DIAGNOSIS — L03032 Cellulitis of left toe: Secondary | ICD-10-CM | POA: Diagnosis not present

## 2015-05-29 DIAGNOSIS — G40A09 Absence epileptic syndrome, not intractable, without status epilepticus: Secondary | ICD-10-CM | POA: Diagnosis not present

## 2015-05-29 DIAGNOSIS — E1042 Type 1 diabetes mellitus with diabetic polyneuropathy: Secondary | ICD-10-CM

## 2015-05-29 DIAGNOSIS — D649 Anemia, unspecified: Secondary | ICD-10-CM | POA: Diagnosis not present

## 2015-05-29 DIAGNOSIS — I951 Orthostatic hypotension: Secondary | ICD-10-CM | POA: Diagnosis not present

## 2015-05-29 DIAGNOSIS — G990 Autonomic neuropathy in diseases classified elsewhere: Secondary | ICD-10-CM

## 2015-05-29 DIAGNOSIS — I2510 Atherosclerotic heart disease of native coronary artery without angina pectoris: Secondary | ICD-10-CM

## 2015-05-29 DIAGNOSIS — I10 Essential (primary) hypertension: Secondary | ICD-10-CM

## 2015-05-29 DIAGNOSIS — E1043 Type 1 diabetes mellitus with diabetic autonomic (poly)neuropathy: Secondary | ICD-10-CM

## 2015-05-29 MED ORDER — METOPROLOL TARTRATE 25 MG PO TABS: ORAL_TABLET | ORAL | Status: DC

## 2015-05-29 NOTE — Patient Instructions (Signed)
Stop amlodipine.

## 2015-05-29 NOTE — Progress Notes (Signed)
Patient ID: Benjamin Hodge, male   DOB: 1940/09/26, 74 y.o.   MRN: [id number]    Facility  [facility]    Place of Service:   OFFICE    Allergies  Allergen Reactions  . Lipitor [Atorvastatin] Other (See Comments)    Caused pain all over body.  . Ace Inhibitors Other (See Comments)    dizzy  . Angiotensin Receptor Blockers Other (See Comments)    unknown  . Bee Venom Swelling  . Cymbalta [Duloxetine Hcl] Other (See Comments)    dizzy    Chief Complaint  Patient presents with  . Medical Management of Chronic Issues    HPI:  Patient saw his endocrinologist 9-16. He increased his Humalog slightly. Patient then had a significant hypoglycemic event at home. Capillary glucose was measured at 24. Following this, his Toujeo was decreased to 11 units (previously at 14 units). He was seen again at the endocrinologist's office 05/27/2015 to receive instructions of a new style of Accu-Chek metering device.. He had a very low blood pressure 102/54 with pulse of 59 while at the office. He was sent to the emergency department for evaluation, but never actually got seen. Blood work was drawn, but the wait was prolonged and ultimately he went home prior to being seen. Lab work was unremarkable.  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 Overall blood pressure control seems to indicate that he runs, at best, on the low side.  Coronary artery disease involving native coronary artery of native heart without angina pectoris - denies chest pain or palpitations  Anemia, unspecified anemia type - improving, with latest hemoglobin 10.6 g percent  Absence epileptic syndrome, not intractable, without status epilepticus - no further episodes since the original one in August 2016  Cellulitis of toe of left foot  - cellulitis has resolved and the foot has significantly healed. There is a residual area of scab over the left fifth toe.  HYPOTENSION, ORTHOSTATIC - previously documented episodes of orthostatic hypotension    Medications: Patient's Medications  New Prescriptions   No medications on file  Previous Medications   AMLODIPINE (NORVASC) 2.5 MG TABLET    Take 2.5 mg by mouth daily.   ASPIRIN 81 MG TABLET    Take 81 mg by mouth daily.    CIPROFLOXACIN (CIPRO) 250 MG TABLET    Take 1 tablet (250 mg total) by mouth 2 (two) times daily.   CLOPIDOGREL (PLAVIX) 75 MG TABLET    TAKE 1 TABLET BY MOUTH DAILY WITH BREAKFAST   CONTINUOUS GLUCOSE MONITOR (DEXCOM G4 PLATINUM RECEIVER) KIT    USE AS DIRECTED   DIPHENOXYLATE-ATROPINE (LOMOTIL) 2.5-0.025 MG PER TABLET    TAKE ONE OR TWO TABLETS BY MOUTH TWICE DAILY AS NEEDED   DOXYCYCLINE (VIBRA-TABS) 100 MG TABLET    TAKE 1 TABLET (100 MG TOTAL) BY MOUTH 2 (TWO) TIMES DAILY.   ESCITALOPRAM (LEXAPRO) 20 MG TABLET    TAKE ONE TABLET BY MOUTH ONE TIME DAILY   GABAPENTIN (NEURONTIN) 600 MG TABLET    TAKE ONE TABLET BY MOUTH THREE TIMES DAILY    HYDROCODONE-ACETAMINOPHEN (NORCO/VICODIN) 5-325 MG PER TABLET    Take one tablet by mouth five times daily as needed for pain   INSULIN GLARGINE (TOUJEO SOLOSTAR) 300 UNIT/ML SOPN    Inject 14 Units into the skin at  bedtime.   INSULIN LISPRO (HUMALOG) 100 UNIT/ML KIWKPEN    Inject 1-6 Units into the skin daily as needed (100-199 = 1 units, 200-299 = 2 units, 300-399 = 3 units, 400-499 = 4 units, 500-599 = 5 units, 600+ = 6 units).    LEVOTHYROXINE (SYNTHROID, LEVOTHROID) 75 MCG TABLET    Take 1 tablet (75 mcg total) by mouth daily.   METHYLPHENIDATE (RITALIN) 20 MG TABLET    Take 1 tablet (20 mg total) by mouth 2 (two) times daily.   METOCLOPRAMIDE (REGLAN) 5 MG TABLET    USE ONCE A DAY AFTER LUCHTIME MEAL   METOPROLOL TARTRATE (LOPRESSOR) 25 MG TABLET    Take 0.5 tablets (12.5 mg total) by mouth daily.   MULTIPLE VITAMIN  (MULTIVITAMIN WITH MINERALS) TABS TABLET    Take 1 tablet by mouth daily.   ONE TOUCH ULTRA TEST TEST STRIP    TEST 8 TIMES PER DAY FOR 30 DAYS   PANTOPRAZOLE (PROTONIX) 40 MG TABLET    TAKE ONE TABLET BY MOUTH ONE TIME DAILY   PRAVASTATIN (PRAVACHOL) 20 MG TABLET    Take 1 tablet (20 mg total) by mouth every evening.   PREGABALIN (LYRICA) 50 MG CAPSULE    1-2 capsules at bed to help neuropathic pain   SACCHAROMYCES BOULARDII (FLORASTOR) 250 MG CAPSULE    Take 1 capsule (250 mg total) by mouth 2 (two) times daily.   SILVER SULFADIAZINE (SILVADENE) 1 % CREAM    Apply 1 application topically daily.   TESTOSTERONE CYPIONATE (DEPOTESTOTERONE CYPIONATE) 200 MG/ML INJECTION    Inject 200 mg every 14 days   VITAMIN B-12 (CYANOCOBALAMIN) 1000 MCG TABLET    Take 1,000 mcg by mouth daily.  Modified Medications   No medications on file  Discontinued Medications   No medications on file     Review of Systems  Constitutional: Negative for fever, activity change, appetite change, fatigue and unexpected weight change.       Recent fall  HENT: Negative for ear pain, hearing loss, mouth sores, sore throat and tinnitus.   Eyes: Negative.   Respiratory: Negative for cough, choking, shortness of breath and wheezing.   Cardiovascular: Negative for chest pain, palpitations and leg swelling.  Gastrointestinal: Negative for diarrhea.  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  Skin: Positive for wound (healing left foot).  Allergic/Immunologic: Negative.   Neurological: Positive for weakness and numbness. Negative for dizziness, tremors and headaches.       Absence episode with rolling of the eyes and some posturing in early August 2016. Memory problems with absence of short-term memory at times.  Psychiatric/Behavioral: Positive for confusion. Negative for hallucinations, behavioral problems, dysphoric mood and agitation. The patient is  hyperactive.     Filed Vitals:   05/29/15 1303  BP: 118/66  Pulse: 68  Temp: 97.3 F (36.3 C)  TempSrc: Oral  Resp: 20  Height: 5' 6"  (1.676 m)  Weight: 127 lb 3.2 oz (57.698 kg)  SpO2: 98%   Body mass index is 20.54 kg/(m^2).  Physical Exam  Constitutional: He is oriented to person, place, and time. No distress.  Thin and frail  HENT:  Head: Normocephalic.  Right Ear: External ear normal.  Left Ear: External ear normal.  Nose: Nose normal.  Mouth/Throat: Oropharynx is clear and moist. No oropharyngeal exudate.  Eyes: Conjunctivae and EOM are normal. Pupils are equal, round, and reactive to light.  History of  nonproliferative diabetic retinopathy.  Neck: No JVD present. No tracheal deviation present. No thyromegaly present.  Cardiovascular: Normal rate, regular rhythm and normal heart sounds.  Exam reveals no friction rub.   No murmur heard. Diminished pulsations in dorsalis pedis and posterior tibial arteries bilaterally  Pulmonary/Chest: No respiratory distress. He has no wheezes. He has no rales. He exhibits no tenderness.  Abdominal: He exhibits no distension and no mass. There is no tenderness.  Musculoskeletal: Normal range of motion. He exhibits tenderness. He exhibits no edema.  Lymphadenopathy:    He has no cervical adenopathy.  Neurological: He is oriented to person, place, and time. He has normal reflexes. No cranial nerve deficit. Coordination normal.  Diminished sensation to vibration and monofilament testing bilaterally. Forgetful.  Skin: No rash noted. No erythema. No pallor.  Ulcerations between the toes of the left foot have healed  Psychiatric: He has a normal mood and affect. His behavior is normal. Thought content normal.     Labs reviewed: Lab Summary Latest Ref Rng 05/27/2015 04/10/2015  Hemoglobin 13.0 - 17.0 g/dL 10.6(L) 10.0(L)  Hematocrit 39.0 - 52.0 % 32.8(L) 31.2(L)  White count 4.0 - 10.5 K/uL 8.9 8.0  Platelet count 150 - 400 K/uL 205  (None)  Sodium 135 - 145 mmol/L 141 138  Potassium 3.5 - 5.1 mmol/L 4.8 6.0(H)  Calcium 8.9 - 10.3 mg/dL 9.5 9.3  Phosphorus - (None) (None)  Creatinine 0.61 - 1.24 mg/dL 1.64(H) 1.38(H)  AST 0 - 40 IU/L (None) 14  Alk Phos 39 - 117 IU/L (None) 94  Bilirubin 0.0 - 1.2 mg/dL (None) <0.2  Glucose 65 - 99 mg/dL 171(H) 328(H)  Cholesterol - (None) (None)  HDL cholesterol - (None) (None)  Triglycerides - (None) (None)  LDL Direct - (None) (None)  LDL Calc - (None) (None)  Total protein - (None) (None)  Albumin 3.5 - 4.8 g/dL (None) 3.9   Lab Results  Component Value Date   TSH 0.964 04/10/2015   Lab Results  Component Value Date   BUN 30* 05/27/2015   Lab Results  Component Value Date   HGBA1C 9.7* 02/19/2015       Assessment/Plan  1. 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 Difficult to control swelling between high blood sugars and episodes of hypoglycemia.. Peripheral neuropathy pains seem to respond better to the Lyrica that he was experiencing while on gabapentin. He continues on a low dose of gabapentin. We will discontinue this drug today and keep him on Lyrica. Patient had been put on metoclopramide by Dr. Brett Fairy. This was presumably due to suspicion of gastric retention related to his diabetes. He denies any problems now with significant reflux or discomfort in the chest. He is not sure that the metoclopramide did anything for him. This drug should be used with caution in the elderly. I feel that he can do without it so it is discontinued today. He remains on pantoprazole. Reassess at next visit to see if this drug needs to be continued.  2. Essential hypertension Controlled other than episodes of postural hypotension. Discontinued amlodipine. He remains on metoprolol 25 mg twice daily.  3. Coronary artery disease involving native coronary artery of native heart without angina pectoris No angina or palpitations  4. Anemia,  unspecified anemia type Improving  5. Absence epileptic syndrome, not intractable, without status epilepticus Continue to monitor  6. Cellulitis of toe of left foot Resolved  7. HYPOTENSION, ORTHOSTATIC Discontinued amlodipine

## 2015-06-01 ENCOUNTER — Emergency Department (HOSPITAL_COMMUNITY)
Admission: EM | Admit: 2015-06-01 | Discharge: 2015-06-02 | Disposition: A | Discharge status: Home / Self Care | Payer: Medicare Other | Attending: Emergency Medicine | Admitting: Emergency Medicine

## 2015-06-01 DIAGNOSIS — J449 Chronic obstructive pulmonary disease, unspecified: Secondary | ICD-10-CM | POA: Diagnosis not present

## 2015-06-01 DIAGNOSIS — R531 Weakness: Secondary | ICD-10-CM | POA: Diagnosis not present

## 2015-06-01 DIAGNOSIS — Z87891 Personal history of nicotine dependence: Secondary | ICD-10-CM | POA: Insufficient documentation

## 2015-06-01 DIAGNOSIS — N189 Chronic kidney disease, unspecified: Secondary | ICD-10-CM | POA: Diagnosis not present

## 2015-06-01 DIAGNOSIS — G8929 Other chronic pain: Secondary | ICD-10-CM | POA: Diagnosis not present

## 2015-06-01 DIAGNOSIS — E538 Deficiency of other specified B group vitamins: Secondary | ICD-10-CM | POA: Diagnosis not present

## 2015-06-01 DIAGNOSIS — F419 Anxiety disorder, unspecified: Secondary | ICD-10-CM | POA: Diagnosis not present

## 2015-06-01 DIAGNOSIS — E785 Hyperlipidemia, unspecified: Secondary | ICD-10-CM | POA: Insufficient documentation

## 2015-06-01 DIAGNOSIS — F329 Major depressive disorder, single episode, unspecified: Secondary | ICD-10-CM | POA: Insufficient documentation

## 2015-06-01 DIAGNOSIS — Z79899 Other long term (current) drug therapy: Secondary | ICD-10-CM | POA: Insufficient documentation

## 2015-06-01 DIAGNOSIS — I129 Hypertensive chronic kidney disease with stage 1 through stage 4 chronic kidney disease, or unspecified chronic kidney disease: Secondary | ICD-10-CM | POA: Insufficient documentation

## 2015-06-01 DIAGNOSIS — I252 Old myocardial infarction: Secondary | ICD-10-CM | POA: Diagnosis not present

## 2015-06-01 DIAGNOSIS — J189 Pneumonia, unspecified organism: Secondary | ICD-10-CM | POA: Insufficient documentation

## 2015-06-01 DIAGNOSIS — Z8719 Personal history of other diseases of the digestive system: Secondary | ICD-10-CM | POA: Insufficient documentation

## 2015-06-01 DIAGNOSIS — Z7982 Long term (current) use of aspirin: Secondary | ICD-10-CM | POA: Insufficient documentation

## 2015-06-01 DIAGNOSIS — Z794 Long term (current) use of insulin: Secondary | ICD-10-CM | POA: Insufficient documentation

## 2015-06-01 DIAGNOSIS — Z862 Personal history of diseases of the blood and blood-forming organs and certain disorders involving the immune mechanism: Secondary | ICD-10-CM | POA: Diagnosis not present

## 2015-06-01 DIAGNOSIS — G609 Hereditary and idiopathic neuropathy, unspecified: Secondary | ICD-10-CM | POA: Diagnosis not present

## 2015-06-01 DIAGNOSIS — E11329 Type 2 diabetes mellitus with mild nonproliferative diabetic retinopathy without macular edema: Secondary | ICD-10-CM | POA: Insufficient documentation

## 2015-06-01 DIAGNOSIS — I2510 Atherosclerotic heart disease of native coronary artery without angina pectoris: Secondary | ICD-10-CM | POA: Insufficient documentation

## 2015-06-01 DIAGNOSIS — R404 Transient alteration of awareness: Secondary | ICD-10-CM | POA: Diagnosis not present

## 2015-06-01 LAB — BASIC METABOLIC PANEL
Anion gap: 11 (ref 5–15)
BUN: 38 mg/dL — AB (ref 6–20)
CO2: 27 mmol/L (ref 22–32)
CREATININE: 1.67 mg/dL — AB (ref 0.61–1.24)
Calcium: 9.8 mg/dL (ref 8.9–10.3)
Chloride: 97 mmol/L — ABNORMAL LOW (ref 101–111)
GFR calc Af Amer: 46 mL/min — ABNORMAL LOW (ref >60)
GFR, EST NON AFRICAN AMERICAN: 39 mL/min — AB (ref >60)
Glucose, Bld: 132 mg/dL — ABNORMAL HIGH (ref 65–99)
Potassium: 4.4 mmol/L (ref 3.5–5.1)
SODIUM: 135 mmol/L (ref 135–145)

## 2015-06-01 LAB — CBC WITH DIFFERENTIAL/PLATELET
Basophils Absolute: 0 10*3/uL (ref 0.0–0.1)
Basophils Relative: 1 %
EOS ABS: 0.1 10*3/uL (ref 0.0–0.7)
EOS PCT: 2 %
HCT: 33.1 % — ABNORMAL LOW (ref 39.0–52.0)
Hemoglobin: 11.5 g/dL — ABNORMAL LOW (ref 13.0–17.0)
LYMPHS ABS: 1.1 10*3/uL (ref 0.7–4.0)
Lymphocytes Relative: 17 %
MCH: 32.5 pg (ref 26.0–34.0)
MCHC: 34.7 g/dL (ref 30.0–36.0)
MCV: 93.5 fL (ref 78.0–100.0)
MONOS PCT: 14 %
Monocytes Absolute: 0.9 10*3/uL (ref 0.1–1.0)
Neutro Abs: 4.4 10*3/uL (ref 1.7–7.7)
Neutrophils Relative %: 66 %
PLATELETS: 228 10*3/uL (ref 150–400)
RBC: 3.54 MIL/uL — ABNORMAL LOW (ref 4.22–5.81)
RDW: 13.2 % (ref 11.5–15.5)
WBC: 6.5 10*3/uL (ref 4.0–10.5)

## 2015-06-01 NOTE — ED Notes (Signed)
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

## 2015-06-01 NOTE — ED Provider Notes (Signed)
CSN: [id number]     Arrival date & time 06/01/15  2045 History   First MD Initiated Contact with Patient 06/01/15 2051     Chief Complaint  Patient presents with  . Weakness     (Consider location/radiation/quality/duration/timing/severity/associated sxs/prior Treatment) Patient is a 74 y.o. male presenting with weakness.  Weakness    Benjamin Hodge is a 74 y.o. male with PMH significant for DM, CAD, IBS, COPD, chronic diarrhea, orthostatic hypotension, hypothyroidism, and CKD who presents with 4 month history of sporadic bilateral lower extremity weakness.  Pt reports 6 episodes since June.  Pt states that he will walk 10 paces and will then have to get on all fours and rest for 30 seconds - 1 minute before being able to get up and walk again.  He states that when this happens he feels like his legs are about to give out,so he gets down before he falls.  Endorses b/l lower extremity weakness at times, SOB, diarrhea, and chronic back pain.  Denies falling, LOC, syncope, lower extremity pain, HA, visual changes/disturbances, diplopia, CP, N/V/C, incontinence, melena, hematochezia, or urinary symptoms including hematuria, dysuria, or increased frequency.  Pt reports PCP is Jeanmarie Hubert; however, he has not mentioned this problem to his PCP.  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     Past Medical History  Diagnosis Date  . CAD (coronary artery disease)     LAD 40-50% stenosis, first diagonal small 90% stenosis, circumflex 70% stenosis, OM 80% stenosis, right coronary artery 80-90% stenosis.  . Right upper lobe pneumonia 11/28/2008  . Anxiety 07/18/2008  . Irritable bowel syndrome 04/27/2008  . COPD (chronic obstructive pulmonary disease) 02/27/2008  . B12 deficiency 02/16/2008    in 5/09: B12 262, MMA 1040  . Chronic diarrhea 01/01/2008    s/p EGD 10/06: H pylori + gastritis, duodenal biopsy normal (Dr. Benson Norway); s/p colonoscopy 10/06: hemorrhoids  (Dr. Benson Norway); evaluated by Dr. Scarlette Shorts  in 8/09: tissue transglutaminase AB negative, VIP normal, stool fat content normal, urine 5-HIAA normal; normal GES 11/09 (done for "fluctuating sugars")  . 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 Orthostatic hypotension 02/09/2007  . Hypertension 02/09/2007  . Spinal stenosis in cervical region 05/03    MRI  . Erectile dysfunction 09/27/2006    s/p penile implant  . Anemia, iron deficiency 09/27/2006    neg colonoscopy 2006 - Dr. Benson Norway; ferritin 152; hgb  15.7  on 07/07  . Hypothyroidism 09/27/2006    TSH 2.639  07/07  . Hypersomnia 09/27/2006    evaluated by Dr. Baird Lyons (5/08) and Dr. Brett Fairy; PSG 8/09: chronic delayed sleep phase syndrome (patient sleeps during the day and is awake at night), nocturnal myoclonus (eval for RLS and IDA suggested). Pt advised to change sleeping behavior  . Diabetes mellitus type II 09/07/1984    poorly controlled, complicated by peripheral neuropathy, microalbuminuria, mild non proliferative retinopathy, s/p DKA 7/05, on insulin pump x 4/09, started a pump vacation on 11/19/2008  . Hyperlipidemia   . 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 Hemorrhoids   . Chronic kidney disease   . Unspecified hereditary and idiopathic peripheral neuropathy   . Other chronic pain   . Other malaise and fatigue   . Depression   . Hypertrophy of prostate without urinary obstruction and other lower urinary tract symptoms (LUTS)   . Lumbago   . Heart attack 03/2014    mild   Past Surgical History  Procedure  Laterality Date  . Penile prosthesis implant    . Tonsillectomy    . Colonoscopy  06/25/2005    Dr. Carol Ada  . Left heart catheterization with coronary angiogram [name] 03/13/2014    Procedure: LEFT HEART CATHETERIZATION WITH CORONARY ANGIOGRAM;  Surgeon: Peter M Martinique, MD;  Location: [facility] CATH LAB;  Service: Cardiovascular;  Laterality: [name];  . Peripheral vascular catheterization [name] 02/19/2015     Procedure: Abdominal Aortogram;  Surgeon: Serafina Mitchell, MD;  Location: [place] CV LAB;  Service: Cardiovascular;  Laterality: [name];  . Peripheral vascular catheterization Left 02/19/2015    Procedure: Lower Extremity Angiography;  Surgeon: Serafina Mitchell, MD;  Location: [place] CV LAB;  Service: Cardiovascular;  Laterality: Left;   Family History  Problem Relation Age of Onset  . Bone cancer Mother   . Breast cancer Mother   . Cancer Mother   . Lung cancer Father   . Cancer Father   . Breast cancer Sister   . Cancer Sister   . Lung cancer Brother   . Cancer Sister     type unknown   Social History  Substance Use Topics  . Smoking status: Former Smoker    Types: Cigarettes    Quit date: 09/28/2004  . Smokeless tobacco: Never Used  . Alcohol Use: 0.0 oz/week    0 Standard drinks or equivalent per week     Comment: occ/social (moderate)    Review of Systems   All other systems negative unless otherwise stated in HPI    Allergies  Lipitor; Ace inhibitors; Angiotensin receptor blockers; Bee venom; and Cymbalta  Home Medications   Prior to Admission medications   Medication Sig Start Date End Date Taking? Authorizing Provider  aspirin 81 MG tablet Take 81 mg by mouth daily.    Yes Historical Provider, MD  clopidogrel (PLAVIX) 75 MG tablet TAKE 1 TABLET BY MOUTH DAILY WITH BREAKFAST 02/01/15  Yes Estill Dooms, MD  escitalopram (LEXAPRO) 20 MG tablet TAKE ONE TABLET BY MOUTH ONE TIME DAILY 02/13/15  Yes Gildardo Cranker, DO  HYDROcodone-acetaminophen (NORCO/VICODIN) 5-325 MG per tablet Take one tablet by mouth five times daily as needed for pain 04/29/15  Yes [name] L [name], DO  Insulin Glargine (TOUJEO SOLOSTAR) 300 UNIT/ML SOPN Inject 11 Units into the skin at bedtime.   Yes Historical Provider, MD  insulin lispro (HUMALOG) 100 UNIT/ML KiwkPen Inject 1-6 Units into the skin daily as needed (100-199 = 1 units, 200-299 = 2 units, 300-399 = 3 units, 400-499 = 4 units,  500-599 = 5 units, 600+ = 6 units).  03/16/13  Yes Elayne Snare, MD  levothyroxine (SYNTHROID, LEVOTHROID) 75 MCG tablet Take 1 tablet (75 mcg total) by mouth daily. 05/10/15  Yes Estill Dooms, MD  methylphenidate (RITALIN) 20 MG tablet Take 1 tablet (20 mg total) by mouth 2 (two) times daily. Patient taking differently: Take 20 mg by mouth daily.  02/05/15  Yes Asencion Partridge Dohmeier, MD  metoprolol tartrate (LOPRESSOR) 25 MG tablet One twice daily to control BP Patient taking differently: Take 25 mg by mouth 2 (two) times daily. One twice daily to control BP 05/29/15  Yes Estill Dooms, MD  Multiple Vitamin (MULTIVITAMIN WITH MINERALS) TABS tablet Take 1 tablet by mouth daily. 02/22/15  Yes Bonnielee Haff, MD  pantoprazole (PROTONIX) 40 MG tablet TAKE ONE TABLET BY MOUTH ONE TIME DAILY 05/14/15  Yes Estill Dooms, MD  pravastatin (PRAVACHOL) 20 MG tablet Take 1 tablet (20 mg total)  by mouth every evening. 07/06/14  Yes Minus Breeding, MD  pregabalin (LYRICA) 50 MG capsule 1-2 capsules at bed to help neuropathic pain Patient taking differently: Take 100 mg by mouth at bedtime.  11/23/14  Yes Gildardo Cranker, DO  saccharomyces boulardii (FLORASTOR) 250 MG capsule Take 1 capsule (250 mg total) by mouth 2 (two) times daily. 02/22/15  Yes Bonnielee Haff, MD  vitamin B-12 (CYANOCOBALAMIN) 1000 MCG tablet Take 1,000 mcg by mouth daily.   Yes Historical Provider, MD  Continuous Glucose Monitor (DEXCOM G4 PLATINUM RECEIVER) KIT USE AS DIRECTED 07/10/14   Elayne Snare, MD  diphenoxylate-atropine (LOMOTIL) 2.5-0.025 MG per tablet TAKE ONE OR TWO TABLETS BY MOUTH TWICE DAILY AS NEEDED 03/05/15   Estill Dooms, MD  ONE TOUCH ULTRA TEST test strip TEST 8 TIMES PER DAY FOR 30 DAYS 04/26/14   Elayne Snare, MD  silver sulfADIAZINE (SILVADENE) 1 % cream Apply 1 application topically daily. Patient not taking: Reported on 06/01/2015 02/15/15   Trula Slade, DPM  testosterone cypionate (DEPOTESTOTERONE CYPIONATE) 200 MG/ML injection  Inject 200 mg every 14 days Patient not taking: Reported on 06/01/2015 01/02/15   Estill Dooms, MD   BP 121/56 mmHg  Pulse 60  Resp 20  SpO2 97% Physical Exam  Constitutional: He is oriented to person, place, and time. He appears well-developed and well-nourished.  HENT:  Head: Normocephalic and atraumatic.  Mouth/Throat: Oropharynx is clear and moist.  Eyes: EOM are normal. Pupils are equal, round, and reactive to light. Right conjunctiva is injected. Left conjunctiva is not injected.    Neck: Normal range of motion. Neck supple.  Cardiovascular: Normal rate, regular rhythm, normal heart sounds and intact distal pulses.   No murmur heard. Pulmonary/Chest: Effort normal and breath sounds normal. No respiratory distress. He has no wheezes. He has no rales.  Abdominal: Soft. Bowel sounds are normal. He exhibits no distension. There is no tenderness.  Musculoskeletal: Normal range of motion.  Lymphadenopathy:    He has no cervical adenopathy.  Neurological: He is alert and oriented to person, place, and time.  Mental Status:   AOx3 Cranial Nerves:  I-not tested  II-PERRLA  III, IV, VI-EOMs intact  V-temporal and masseter strength intact  VII-symmetrical facial movements intact, no facial droop  VIII-hearing grossly intact bilaterally  IX, X-gag intact  XI-strength of sternomastoid and trapezius muscles 5/5  XII-tongue midline Motor:   Good muscle bulk and tone  Strength 5/5 bilaterally in upper and lower extremities   Cerebellar--RAMs, finger to nose intact  Romberg--maintains balance with eyes closed  Casual gait slowed without ataxia, baseline  No pronator drift Sensory:  Decreased sensation b/l in lower extremities from midshin down to foot.  It does not appear dermatomal.      Skin: Skin is warm and dry.  Psychiatric: He has a normal mood and affect. His behavior is normal.    ED Course  Procedures (including critical care time) Labs Review Labs Reviewed - No  data to display  Imaging Review No results found. I have personally reviewed and evaluated these images and lab results as part of my medical decision-making.   EKG Interpretation   Date/Time:  Saturday June 01 2015 21:12:38 EDT Ventricular Rate:  61 PR Interval:  136 QRS Duration: 107 QT Interval:  446 QTC Calculation: 449 R Axis:   73 Text Interpretation:  Sinus rhythm Nonspecific T abnrm, anterolateral  leads No acute changes No significant change since last tracing Confirmed  by NANAVATI, MD,  ANKIT [phone]) on 06/01/2015 10:23:44 PM      MDM   Final diagnoses:  None    Pt presents with 4 month history of sporadic b/l LE weakness.  Denies sycope, LOC, fall, leg pain.  VSS, NAD, non-toxic.  On exam, no focal neurological deficits.  Strength 5/5 b/l in lower extremities.  Decreased sensation b/l in lower extremities from midshin down to foot.  No dermatomal pattern.  Could be related to diabetic neuropathy.  Labs include CBC, BMP.  EKG shows no significant changes from previous.  Cr 1.67, baseline.  Doubt acute neurological emergency including cauda equina, epidural abscess, GBS.  DDX is broad at this point.  Follow up with PCP to schedule neurological follow up.  Pt is stable for d/c.  Blood pressure 121/56, pulse 60, resp. rate 20, SpO2 97 %.  Discussed return precautions.  Pt agrees with the above plan.     Gloriann Loan, PA-C 06/01/15 [id number]  Varney Biles, MD 06/02/15 [phone]

## 2015-06-01 NOTE — Discharge Instructions (Signed)
Weakness °Weakness is a lack of strength. You may feel weak all over your body or just in one part of your body. Weakness can be serious. In some cases, you may need more medical tests. °HOME CARE °· Rest. °· Eat a well-balanced diet. °· Try to exercise every day. °· Only take medicines as told by your doctor. °GET HELP RIGHT AWAY IF:  °· You cannot do your normal daily activities. °· You cannot walk up and down stairs, or you feel very tired when you do so. °· You have shortness of breath or chest pain. °· You have trouble moving parts of your body. °· You have weakness in only one body part or on only one side of the body. °· You have a fever. °· You have trouble speaking or swallowing. °· You cannot control when you pee (urinate) or poop (bowel movement). °· You have black or bloody throw up (vomit) or poop. °· Your weakness gets worse or spreads to other body parts. °· You have new aches or pains. °MAKE SURE YOU:  °· Understand these instructions. °· Will watch your condition. °· Will get help right away if you are not doing well or get worse. °Document Released: 08/06/2008 Document Revised: 02/23/2012 Document Reviewed: 10/23/2011 °ExitCare® Patient Information ©2015 [employer]. This information is not intended to replace advice given to you by your health care provider. Make sure you discuss any questions you have with your health care provider. ° °

## 2015-06-01 NOTE — ED Notes (Signed)
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

## 2015-06-04 ENCOUNTER — Encounter: Payer: Self-pay | Admitting: Vascular Surgery

## 2015-06-04 ENCOUNTER — Other Ambulatory Visit: Payer: Self-pay | Admitting: *Deleted

## 2015-06-04 ENCOUNTER — Telehealth: Payer: Self-pay | Admitting: *Deleted

## 2015-06-04 MED ORDER — HYDROCODONE-ACETAMINOPHEN 5-325 MG PO TABS: ORAL_TABLET | ORAL | Status: DC

## 2015-06-04 NOTE — Telephone Encounter (Signed)
Patient daughter in law, Trula Ore called and stated that patient had another "episode" this weekend and went to the ER. Had weakness in his legs.  Went Saturday night. He [place] the [place] with [place] time job and started feeling weak in the legs. Couldn't walk more that 10 feet so he squatted down on all fours. As he did this he cut his elbow and was bleeding. The ambulance was called and took him to ER and they told him that he should get a consult from the Neurologist due to not being able to walk. Has an appointment with Dr. Vickey Huger on 07/16/2015, but if you think he needs to see a Neurologist the caregiver wants someone else because she doesn't think she is taking care of his needs.  Patient stated that he has been battling high blood sugars the last few days but unaware of the numbers. Caregiver stated that he got a new meter last week, but he is afraid to use because it tracks his numbers. Please Advise.  Also stated that you told him to stop the Gabapentin last week and now he is having pain in his legs and wants to go back on the Gabapentin and he cannot afford the Lyrica Rx you wrote for him. Please Advise.

## 2015-06-04 NOTE — Telephone Encounter (Signed)
Patient requested and will pick up 

## 2015-06-05 DIAGNOSIS — H1131 Conjunctival hemorrhage, right eye: Secondary | ICD-10-CM | POA: Diagnosis not present

## 2015-06-05 MED ORDER — GABAPENTIN 100 MG PO CAPS: 100.0000 mg | ORAL_CAPSULE | Freq: Three times a day (TID) | ORAL | Status: DC

## 2015-06-05 NOTE — Telephone Encounter (Signed)
Patient caregiver, Trula Ore notified and agreed. Medication changed in medication list.

## 2015-06-05 NOTE — Telephone Encounter (Signed)
I'm not sure how [facility] feels about switching doctors within their group. Please call and find out if he would be eligible to see someone besides Dr. Vickey Huger in their practice.  Discontinue Lyrica and resume the gabapentin at the previous dosing.

## 2015-06-06 ENCOUNTER — Ambulatory Visit (INDEPENDENT_AMBULATORY_CARE_PROVIDER_SITE_OTHER)
Admission: RE | Admit: 2015-06-06 | Discharge: 2015-06-06 | Disposition: A | Discharge status: Home / Self Care | Payer: Medicare Other | Source: Ambulatory Visit | Attending: Vascular Surgery | Admitting: Vascular Surgery

## 2015-06-06 ENCOUNTER — Encounter: Payer: Self-pay | Admitting: Vascular Surgery

## 2015-06-06 ENCOUNTER — Ambulatory Visit (HOSPITAL_COMMUNITY)
Admission: RE | Admit: 2015-06-06 | Discharge: 2015-06-06 | Disposition: A | Discharge status: Home / Self Care | Payer: Medicare Other | Source: Ambulatory Visit | Attending: Vascular Surgery | Admitting: Vascular Surgery

## 2015-06-06 ENCOUNTER — Ambulatory Visit (INDEPENDENT_AMBULATORY_CARE_PROVIDER_SITE_OTHER): Payer: Medicare Other | Admitting: Vascular Surgery

## 2015-06-06 ENCOUNTER — Other Ambulatory Visit: Payer: Self-pay | Admitting: *Deleted

## 2015-06-06 VITALS — BP 120/73 | HR 60 | Ht 66.0 in | Wt 125.9 lb

## 2015-06-06 DIAGNOSIS — I739 Peripheral vascular disease, unspecified: Secondary | ICD-10-CM | POA: Diagnosis not present

## 2015-06-06 DIAGNOSIS — L98499 Non-pressure chronic ulcer of skin of other sites with unspecified severity: Secondary | ICD-10-CM

## 2015-06-06 DIAGNOSIS — I70209 Unspecified atherosclerosis of native arteries of extremities, unspecified extremity: Secondary | ICD-10-CM

## 2015-06-06 DIAGNOSIS — Z9862 Peripheral vascular angioplasty status: Secondary | ICD-10-CM | POA: Insufficient documentation

## 2015-06-06 DIAGNOSIS — Z48812 Encounter for surgical aftercare following surgery on the circulatory system: Secondary | ICD-10-CM | POA: Diagnosis not present

## 2015-06-06 NOTE — Progress Notes (Signed)
Patient is a 74 year old male who returns for follow-up today.  On 02/21/2015 he underwent atherectomy of his left popliteal artery as well as angioplasty of left popliteal artery with drug coated balloon. This was done for nonhealing wounds on the left foot. He states that the wounds have completely healed. He denies rest pain. He denies claudication symptoms. He does complain of some occasional dizziness and instability of gait. He states that this is not as bad if he gets up slowly. He is on Plavix and aspirin.  He also states he still has difficulty controlling his glucose. He is trying to decide whether or not to quit drinking alcohol to help with this. He apparently drinks about 3 margaritas every evening. He is not smoking. Chronic medical problems remain coronary artery disease, COPD, diabetes and hypertension all of which are currently controlled.  Past Medical History  Diagnosis Date  . CAD (coronary artery disease)     LAD 40-50% stenosis, first diagonal small 90% stenosis, circumflex 70% stenosis, OM 80% stenosis, right coronary artery 80-90% stenosis.  . Right upper lobe pneumonia 11/28/2008  . Anxiety 07/18/2008  . Irritable bowel syndrome 04/27/2008  . COPD (chronic obstructive pulmonary disease) 02/27/2008  . B12 deficiency 02/16/2008    in 5/09: B12 262, MMA 1040  . Chronic diarrhea 01/01/2008    s/p EGD 10/06: H pylori + gastritis, duodenal biopsy normal (Dr. Benson Norway); s/p colonoscopy 10/06: hemorrhoids (Dr. Benson Norway); evaluated by Dr. Scarlette Shorts  in 8/09: tissue transglutaminase AB negative, VIP normal, stool fat content normal, urine 5-HIAA normal; normal GES 11/09 (done for "fluctuating sugars")  . 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 Orthostatic hypotension 02/09/2007  . Hypertension 02/09/2007  . Spinal stenosis in cervical region 05/03    MRI  . Erectile dysfunction 09/27/2006    s/p penile implant  . Anemia, iron deficiency 09/27/2006    neg  colonoscopy 2006 - Dr. Benson Norway; ferritin 152; hgb  15.7  on 07/07  . Hypothyroidism 09/27/2006    TSH 2.639  07/07  . Hypersomnia 09/27/2006    evaluated by Dr. Baird Lyons (5/08) and Dr. Brett Fairy; PSG 8/09: chronic delayed sleep phase syndrome (patient sleeps during the day and is awake at night), nocturnal myoclonus (eval for RLS and IDA suggested). Pt advised to change sleeping behavior  . Diabetes mellitus type II 09/07/1984    poorly controlled, complicated by peripheral neuropathy, microalbuminuria, mild non proliferative retinopathy, s/p DKA 7/05, on insulin pump x 4/09, started a pump vacation on 11/19/2008  . Hyperlipidemia   . 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 Hemorrhoids   . Chronic kidney disease   . Unspecified hereditary and idiopathic peripheral neuropathy   . Other chronic pain   . Other malaise and fatigue   . Depression   . Hypertrophy of prostate without urinary obstruction and other lower urinary tract symptoms (LUTS)   . Lumbago   . Heart attack 03/2014    mild   Current Outpatient Prescriptions on File Prior to Visit  Medication Sig Dispense Refill  . aspirin 81 MG tablet Take 81 mg by mouth daily.     . clopidogrel (PLAVIX) 75 MG tablet TAKE 1 TABLET BY MOUTH DAILY WITH BREAKFAST 30 tablet 3  . Continuous Glucose Monitor (DEXCOM G4 PLATINUM RECEIVER) KIT USE AS DIRECTED 1 kit 1  . diphenoxylate-atropine (LOMOTIL) 2.5-0.025 MG per tablet TAKE ONE OR TWO TABLETS BY MOUTH TWICE DAILY AS NEEDED 60 tablet 0  . escitalopram (LEXAPRO) 20 MG tablet TAKE ONE TABLET  BY MOUTH ONE TIME DAILY 30 tablet 5  . gabapentin (NEURONTIN) 100 MG capsule Take 1 capsule (100 mg total) by mouth 3 (three) times daily. For pains 90 capsule 3  . HYDROcodone-acetaminophen (NORCO/VICODIN) 5-325 MG tablet Take one tablet by mouth five times daily as needed for pain 150 tablet 0  . Insulin Glargine (TOUJEO SOLOSTAR) 300 UNIT/ML SOPN Inject 11 Units into the skin at bedtime.    . insulin lispro (HUMALOG)  100 UNIT/ML KiwkPen Inject 1-6 Units into the skin daily as needed (100-199 = 1 units, 200-299 = 2 units, 300-399 = 3 units, 400-499 = 4 units, 500-599 = 5 units, 600+ = 6 units).     Marland Kitchen levothyroxine (SYNTHROID, LEVOTHROID) 75 MCG tablet Take 1 tablet (75 mcg total) by mouth daily. 30 tablet 5  . methylphenidate (RITALIN) 20 MG tablet Take 1 tablet (20 mg total) by mouth 2 (two) times daily. (Patient taking differently: Take 20 mg by mouth daily. ) 180 tablet 0  . metoprolol tartrate (LOPRESSOR) 25 MG tablet One twice daily to control BP (Patient taking differently: Take 25 mg by mouth 2 (two) times daily. One twice daily to control BP) 60 tablet 5  . Multiple Vitamin (MULTIVITAMIN WITH MINERALS) TABS tablet Take 1 tablet by mouth daily. 30 tablet 3  . ONE TOUCH ULTRA TEST test strip TEST 8 TIMES PER DAY FOR 30 DAYS 250 each 4  . pantoprazole (PROTONIX) 40 MG tablet TAKE ONE TABLET BY MOUTH ONE TIME DAILY 30 tablet 5  . pravastatin (PRAVACHOL) 20 MG tablet Take 1 tablet (20 mg total) by mouth every evening. 90 tablet 3  . saccharomyces boulardii (FLORASTOR) 250 MG capsule Take 1 capsule (250 mg total) by mouth 2 (two) times daily. 60 capsule 0  . silver sulfADIAZINE (SILVADENE) 1 % cream Apply 1 application topically daily. 50 g 0  . testosterone cypionate (DEPOTESTOTERONE CYPIONATE) 200 MG/ML injection Inject 200 mg every 14 days 10 mL 0  . vitamin B-12 (CYANOCOBALAMIN) 1000 MCG tablet Take 1,000 mcg by mouth daily.     No current facility-administered medications on file prior to visit.    Physical exam:     Filed Vitals:   06/06/15 1531  BP: 120/73  Pulse: 60  Height: 5' 6" (1.676 m)  Weight: 125 lb 14.4 oz (57.108 kg)  SpO2: 100%    Extremities: Absent popliteal and pedal pulses. Left foot fifth toe completely healed  Data: Duplex ultrasound today shows a patent left popliteal artery with some mild narrowing of the left superficial femoral artery TBI today was 0.61 compared to 2.11  in June of this year Assessment: Healed left foot wound status post atherectomy and angioplasty of left popliteal artery.  Plan: The patient will follow-up with me in 3 months time to make sure that his popliteal artery is still patent. He will have an arterial duplex scan at that time as well as ABIs.  Ruta Hinds, MD [facility] Office: [phone] Pager: [phone]

## 2015-06-06 NOTE — Addendum Note (Signed)
Addended by: Adria Dill L on: 06/06/2015 04:24 PM   Modules accepted: Orders

## 2015-06-11 ENCOUNTER — Other Ambulatory Visit: Payer: Self-pay | Admitting: Internal Medicine

## 2015-06-12 ENCOUNTER — Other Ambulatory Visit: Payer: Self-pay | Admitting: *Deleted

## 2015-06-12 ENCOUNTER — Telehealth: Payer: Self-pay | Admitting: *Deleted

## 2015-06-12 MED ORDER — PREGABALIN 50 MG PO CAPS: 50.0000 mg | ORAL_CAPSULE | Freq: Three times a day (TID) | ORAL | Status: DC

## 2015-06-12 NOTE — Telephone Encounter (Signed)
Called patient's daughter -in-law regarding what is going on with his medication, she explalned to me that he didn't understand that Dr. Chilton Si was talking about taking him off his Lyrica. He states that he need that for his neuropathy pain, he stated that it works for him and does not want to change. His daughter- in-law stated that she would make sure that he does not take both because she has his medications and she set up his medication boxes.

## 2015-06-12 NOTE — Telephone Encounter (Signed)
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

## 2015-06-28 ENCOUNTER — Other Ambulatory Visit: Payer: Medicare Other

## 2015-06-28 ENCOUNTER — Other Ambulatory Visit: Payer: Self-pay | Admitting: *Deleted

## 2015-06-28 DIAGNOSIS — D649 Anemia, unspecified: Secondary | ICD-10-CM | POA: Diagnosis not present

## 2015-06-28 DIAGNOSIS — E1065 Type 1 diabetes mellitus with hyperglycemia: Secondary | ICD-10-CM | POA: Diagnosis not present

## 2015-06-28 DIAGNOSIS — E1051 Type 1 diabetes mellitus with diabetic peripheral angiopathy without gangrene: Secondary | ICD-10-CM

## 2015-06-28 DIAGNOSIS — E1043 Type 1 diabetes mellitus with diabetic autonomic (poly)neuropathy: Secondary | ICD-10-CM | POA: Diagnosis not present

## 2015-06-28 DIAGNOSIS — E1049 Type 1 diabetes mellitus with other diabetic neurological complication: Secondary | ICD-10-CM

## 2015-06-28 DIAGNOSIS — E538 Deficiency of other specified B group vitamins: Secondary | ICD-10-CM

## 2015-06-28 DIAGNOSIS — IMO0002 Reserved for concepts with insufficient information to code with codable children: Secondary | ICD-10-CM

## 2015-06-29 LAB — CBC WITH DIFFERENTIAL/PLATELET
BASOS: 1 %
Basophils Absolute: 0 10*3/uL (ref 0.0–0.2)
EOS (ABSOLUTE): 0.2 10*3/uL (ref 0.0–0.4)
Eos: 3 %
Hematocrit: 33.8 % — ABNORMAL LOW (ref 37.5–51.0)
Hemoglobin: 11 g/dL — ABNORMAL LOW (ref 12.6–17.7)
IMMATURE GRANULOCYTES: 0 %
Immature Grans (Abs): 0 10*3/uL (ref 0.0–0.1)
LYMPHS ABS: 1.3 10*3/uL (ref 0.7–3.1)
Lymphs: 26 %
MCH: 31.3 pg (ref 26.6–33.0)
MCHC: 32.5 g/dL (ref 31.5–35.7)
MCV: 96 fL (ref 79–97)
MONOS ABS: 0.5 10*3/uL (ref 0.1–0.9)
Monocytes: 10 %
NEUTROS ABS: 3.1 10*3/uL (ref 1.4–7.0)
NEUTROS PCT: 60 %
PLATELETS: 231 10*3/uL (ref 150–379)
RBC: 3.51 x10E6/uL — ABNORMAL LOW (ref 4.14–5.80)
RDW: 14.6 % (ref 12.3–15.4)
WBC: 5 10*3/uL (ref 3.4–10.8)

## 2015-06-29 LAB — COMPREHENSIVE METABOLIC PANEL
ALK PHOS: 95 IU/L (ref 39–117)
ALT: 40 IU/L (ref 0–44)
AST: 31 IU/L (ref 0–40)
Albumin/Globulin Ratio: 1.6 (ref 1.1–2.5)
Albumin: 4.1 g/dL (ref 3.5–4.8)
BILIRUBIN TOTAL: 0.3 mg/dL (ref 0.0–1.2)
BUN/Creatinine Ratio: 24 — ABNORMAL HIGH (ref 10–22)
BUN: 31 mg/dL — AB (ref 8–27)
CHLORIDE: 101 mmol/L (ref 97–106)
CO2: 24 mmol/L (ref 18–29)
CREATININE: 1.31 mg/dL — AB (ref 0.76–1.27)
Calcium: 9.4 mg/dL (ref 8.6–10.2)
GFR calc Af Amer: 62 mL/min/{1.73_m2} (ref >59)
GFR calc non Af Amer: 54 mL/min/{1.73_m2} — ABNORMAL LOW (ref >59)
GLOBULIN, TOTAL: 2.5 g/dL (ref 1.5–4.5)
GLUCOSE: 232 mg/dL — AB (ref 65–99)
Potassium: 4.7 mmol/L (ref 3.5–5.2)
SODIUM: 138 mmol/L (ref 136–144)
Total Protein: 6.6 g/dL (ref 6.0–8.5)

## 2015-06-29 LAB — HEMOGLOBIN A1C
ESTIMATED AVERAGE GLUCOSE: 258 mg/dL
HEMOGLOBIN A1C: 10.6 % — AB (ref 4.8–5.6)

## 2015-06-29 LAB — IRON AND TIBC
IRON: 91 ug/dL (ref 38–169)
Iron Saturation: 28 % (ref 15–55)
Total Iron Binding Capacity: 326 ug/dL (ref 250–450)
UIBC: 235 ug/dL (ref 111–343)

## 2015-06-29 LAB — VITAMIN B12: Vitamin B-12: >2000 pg/mL — ABNORMAL HIGH (ref 211–946)

## 2015-06-29 LAB — RETICULOCYTES: Retic Ct Pct: 1.5 % (ref 0.6–2.6)

## 2015-07-02 ENCOUNTER — Ambulatory Visit (INDEPENDENT_AMBULATORY_CARE_PROVIDER_SITE_OTHER): Payer: Medicare Other | Admitting: Internal Medicine

## 2015-07-02 ENCOUNTER — Encounter: Payer: Self-pay | Admitting: Internal Medicine

## 2015-07-02 VITALS — BP 132/70 | HR 63 | Temp 97.4°F | Resp 20 | Ht 66.0 in | Wt 123.6 lb

## 2015-07-02 DIAGNOSIS — E039 Hypothyroidism, unspecified: Secondary | ICD-10-CM

## 2015-07-02 DIAGNOSIS — I10 Essential (primary) hypertension: Secondary | ICD-10-CM | POA: Diagnosis not present

## 2015-07-02 DIAGNOSIS — E1049 Type 1 diabetes mellitus with other diabetic neurological complication: Secondary | ICD-10-CM

## 2015-07-02 DIAGNOSIS — D649 Anemia, unspecified: Secondary | ICD-10-CM | POA: Diagnosis not present

## 2015-07-02 DIAGNOSIS — IMO0002 Reserved for concepts with insufficient information to code with codable children: Secondary | ICD-10-CM

## 2015-07-02 DIAGNOSIS — G40A09 Absence epileptic syndrome, not intractable, without status epilepticus: Secondary | ICD-10-CM

## 2015-07-02 DIAGNOSIS — E1065 Type 1 diabetes mellitus with hyperglycemia: Secondary | ICD-10-CM | POA: Diagnosis not present

## 2015-07-02 DIAGNOSIS — Z23 Encounter for immunization: Secondary | ICD-10-CM | POA: Diagnosis not present

## 2015-07-02 DIAGNOSIS — E1051 Type 1 diabetes mellitus with diabetic peripheral angiopathy without gangrene: Secondary | ICD-10-CM | POA: Diagnosis not present

## 2015-07-02 MED ORDER — LEVETIRACETAM 500 MG PO TABS: ORAL_TABLET | ORAL | Status: DC

## 2015-07-02 NOTE — Progress Notes (Signed)
Patient ID: Benjamin Hodge, male   DOB: 1941/03/18, 74 y.o.   MRN: [id number]    Facility  [facility]    Place of Service:   OFFICE    Allergies  Allergen Reactions  . Lipitor [Atorvastatin] Other (See Comments)    Caused pain all over body.  . Ace Inhibitors Other (See Comments)    dizzy  . Angiotensin Receptor Blockers Other (See Comments)    unknown  . Bee Venom Swelling  . Cymbalta [Duloxetine Hcl] Other (See Comments)    dizzy    Chief Complaint  Patient presents with  . Medical Management of Chronic Issues    HPI:  Absence attack ([place]) - continues with unpredictable episodes where he become suddenly weak and cannot walk. There are other episodes that he just can't remember, but the family finds him on the floor.  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   Essential hypertension - controlled  Anemia, unspecified anemia type - needs further follow-up  Diabetes mellitus type 1 with peripheral artery disease ([place]) - controlled  Hypothyroidism, unspecified hypothyroidism type - compensated  Encounter for immunization - flu vaccine  Need for vaccination with 13-polyvalent pneumococcal conjugate vaccine - needs Prevnar    Medications: Patient's Medications  New Prescriptions   No medications on file  Previous Medications   ASPIRIN 81 MG TABLET    Take 81 mg by mouth daily.    CLOPIDOGREL (PLAVIX) 75 MG TABLET    TAKE 1 TABLET BY MOUTH DAILY WITH BREAKFAST   CONTINUOUS GLUCOSE MONITOR (DEXCOM G4 PLATINUM RECEIVER) KIT    USE AS DIRECTED   DIPHENOXYLATE-ATROPINE (LOMOTIL) 2.5-0.025 MG PER TABLET    TAKE ONE OR TWO TABLETS BY MOUTH TWICE DAILY AS NEEDED   ESCITALOPRAM (LEXAPRO) 20 MG TABLET    TAKE ONE TABLET BY MOUTH ONE TIME DAILY   HYDROCODONE-ACETAMINOPHEN (NORCO/VICODIN) 5-325 MG TABLET    Take one tablet by mouth five times daily as needed for pain   INSULIN GLARGINE (TOUJEO SOLOSTAR) 300  UNIT/ML SOPN    Inject 12 Units into the skin at bedtime.    INSULIN LISPRO (HUMALOG) 100 UNIT/ML KIWKPEN    Inject 1-6 Units into the skin daily as needed (100-199 = 1 units, 200-299 = 2 units, 300-399 = 3 units, 400-499 = 4 units, 500-599 = 5 units, 600+ = 6 units).    LEVOTHYROXINE (SYNTHROID, LEVOTHROID) 75 MCG TABLET    Take 1 tablet (75 mcg total) by mouth daily.   MELATONIN 3 MG TABS    Take 1 tablet by mouth at bedtime.   METHYLPHENIDATE (RITALIN) 20 MG TABLET    Take 20 mg by mouth every morning.   METOPROLOL TARTRATE (LOPRESSOR) 25 MG TABLET    One twice daily to control BP   MULTIPLE VITAMIN (MULTIVITAMIN WITH MINERALS) TABS TABLET    Take 1 tablet by mouth daily.   ONE TOUCH ULTRA TEST TEST STRIP    TEST 8 TIMES PER DAY FOR 30 DAYS   PANTOPRAZOLE (PROTONIX) 40 MG TABLET    TAKE ONE TABLET BY MOUTH ONE TIME DAILY   PRAVASTATIN (PRAVACHOL) 20 MG TABLET    Take 1 tablet (20 mg total) by mouth every evening.   PREGABALIN (LYRICA) 50 MG CAPSULE    Take 1 capsule (50 mg total) by mouth 3 (three) times daily.   SACCHAROMYCES BOULARDII (FLORASTOR) 250 MG CAPSULE    Take 1 capsule (250 mg total) by  mouth 2 (two) times daily.   SILVER SULFADIAZINE (SILVADENE) 1 % CREAM    Apply 1 application topically daily.   TESTOSTERONE CYPIONATE (DEPOTESTOTERONE CYPIONATE) 200 MG/ML INJECTION    Inject 200 mg every 14 days   VITAMIN B-12 (CYANOCOBALAMIN) 1000 MCG TABLET    Take 1,000 mcg by mouth daily.  Modified Medications   No medications on file  Discontinued Medications   GABAPENTIN (NEURONTIN) 100 MG CAPSULE    Take 1 capsule (100 mg total) by mouth 3 (three) times daily. For pains   METHYLPHENIDATE (RITALIN) 20 MG TABLET    Take 1 tablet (20 mg total) by mouth 2 (two) times daily.    Review of Systems  Constitutional: Negative for fever, activity change, appetite change, fatigue and unexpected weight change.       Recent fall  HENT: Negative for ear pain, hearing loss, mouth sores, sore throat  and tinnitus.   Eyes: Negative.   Respiratory: Negative for cough, choking, shortness of breath and wheezing.   Cardiovascular: Negative for chest pain, palpitations and leg swelling.  Gastrointestinal: Negative for diarrhea.  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  Skin: Positive for wound (healing left foot).  Allergic/Immunologic: Negative.   Neurological: Positive for weakness and numbness. Negative for dizziness, tremors and headaches.       Absence episode with rolling of the eyes and some posturing in early August 2016. Memory problems with absence of short-term memory at times.  Psychiatric/Behavioral: Positive for confusion. Negative for hallucinations, behavioral problems, dysphoric mood and agitation. The patient is hyperactive.     Filed Vitals:   07/02/15 1551  BP: 132/70  Pulse: 63  Temp: 97.4 F (36.3 C)  TempSrc: Oral  Resp: 20  Height: _0  (1.676 m)  Weight: 123 lb 9.6 oz (56.065 kg)  SpO2: 98%   Body mass index is 19.96 kg/(m^2).  Physical Exam  Constitutional: He is oriented to person, place, and time. No distress.  Thin and frail  HENT:  Head: Normocephalic.  Right Ear: External ear normal.  Left Ear: External ear normal.  Nose: Nose normal.  Mouth/Throat: Oropharynx is clear and moist. No oropharyngeal exudate.  Eyes: Conjunctivae and EOM are normal. Pupils are equal, round, and reactive to light.  History of nonproliferative diabetic retinopathy.  Neck: No JVD present. No tracheal deviation present. No thyromegaly present.  Cardiovascular: Normal rate, regular rhythm and normal heart sounds.  Exam reveals no friction rub.   No murmur heard. Diminished pulsations in dorsalis pedis and posterior tibial arteries bilaterally  Pulmonary/Chest: No respiratory distress. He has no wheezes. He has no rales. He exhibits no tenderness.  Abdominal: He exhibits no distension and no  mass. There is no tenderness.  Musculoskeletal: Normal range of motion. He exhibits tenderness. He exhibits no edema.  Lymphadenopathy:    He has no cervical adenopathy.  Neurological: He is oriented to person, place, and time. He has normal reflexes. No cranial nerve deficit. Coordination normal.  Diminished sensation to vibration and monofilament testing bilaterally. Forgetful.  Skin: No rash noted. No erythema. No pallor.  Ulcerations between the toes of the left foot have healed  Psychiatric: He has a normal mood and affect. His behavior is normal. Thought content normal.    Labs reviewed: Lab Summary Latest Ref Rng 06/28/2015 06/01/2015 05/27/2015  Hemoglobin 12.6 - 17.7 g/dL 11.0(L) 11.5(L) 10.6(L)  Hematocrit 37.5 - 51.0 % 33.8(L) 33.1(L) 32.8(L)  White count 3.4 - 10.8 x10E3/uL 5.0 6.5 8.9  Platelet count 150 - 379 x10E3/uL 231 228 205  Sodium 136 - 144 mmol/L 138 135 141  Potassium 3.5 - 5.2 mmol/L 4.7 4.4 4.8  Calcium 8.6 - 10.2 mg/dL 9.4 9.8 9.5  Phosphorus - (None) (None) (None)  Creatinine 0.76 - 1.27 mg/dL 1.31(H) 1.67(H) 1.64(H)  AST 0 - 40 IU/L 31 (None) (None)  Alk Phos 39 - 117 IU/L 95 (None) (None)  Bilirubin 0.0 - 1.2 mg/dL 0.3 (None) (None)  Glucose 65 - 99 mg/dL 232(H) 132(H) 171(H)  Cholesterol - (None) (None) (None)  HDL cholesterol - (None) (None) (None)  Triglycerides - (None) (None) (None)  LDL Direct - (None) (None) (None)  LDL Calc - (None) (None) (None)  Total protein - (None) (None) (None)  Albumin 3.5 - 4.8 g/dL 4.1 (None) (None)   Lab Results  Component Value Date   TSH 0.964 04/10/2015   Lab Results  Component Value Date   BUN 31* 06/28/2015   Lab Results  Component Value Date   HGBA1C 10.6* 06/28/2015    Assessment/Plan  1. Absence attack ([place]) I believe patient is having seizures -Start Keppra 500 mg twice daily  2. 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 He  is followed by endocrinology.  3. Essential hypertension Controlled  4. Anemia, unspecified anemia type -CBC, future  5. Diabetes mellitus type 1 with peripheral artery disease ([place]) -Remains uncontrolled and followed by endocrinology.  6. Hypothyroidism, unspecified hypothyroidism type Compensated  7. Encounter for immunization Flu vaccine  8. Need for vaccination with 13-polyvalent pneumococcal conjugate vaccine - Pneumococcal conjugate vaccine 13-valent

## 2015-07-09 ENCOUNTER — Other Ambulatory Visit: Payer: Self-pay | Admitting: *Deleted

## 2015-07-09 MED ORDER — HYDROCODONE-ACETAMINOPHEN 5-325 MG PO TABS: ORAL_TABLET | ORAL | Status: DC

## 2015-07-09 NOTE — Telephone Encounter (Signed)
Patient requested and will pick up . Narcotic Contract printed.  

## 2015-07-16 ENCOUNTER — Telehealth: Payer: Self-pay

## 2015-07-16 ENCOUNTER — Ambulatory Visit (INDEPENDENT_AMBULATORY_CARE_PROVIDER_SITE_OTHER): Payer: Medicare Other | Admitting: Neurology

## 2015-07-16 ENCOUNTER — Encounter: Payer: Self-pay | Admitting: Neurology

## 2015-07-16 VITALS — BP 132/60 | HR 78 | Resp 20 | Ht 67.0 in | Wt 127.0 lb

## 2015-07-16 DIAGNOSIS — G40209 Localization-related (focal) (partial) symptomatic epilepsy and epileptic syndromes with complex partial seizures, not intractable, without status epilepticus: Secondary | ICD-10-CM | POA: Insufficient documentation

## 2015-07-16 DIAGNOSIS — G4711 Idiopathic hypersomnia with long sleep time: Secondary | ICD-10-CM

## 2015-07-16 DIAGNOSIS — F068 Other specified mental disorders due to known physiological condition: Secondary | ICD-10-CM

## 2015-07-16 DIAGNOSIS — F0390 Unspecified dementia without behavioral disturbance: Secondary | ICD-10-CM

## 2015-07-16 DIAGNOSIS — G40A09 Absence epileptic syndrome, not intractable, without status epilepticus: Secondary | ICD-10-CM | POA: Diagnosis not present

## 2015-07-16 MED ORDER — LEVETIRACETAM 750 MG PO TABS: ORAL_TABLET | ORAL | Status: DC

## 2015-07-16 NOTE — Progress Notes (Signed)
[facility]  Provider:  Melvyn Novas, M D  Referring Provider: Kimber Relic, MD Primary Care Physician:  Kimber Relic, MD  Chief Complaint  Patient presents with  . Follow-up    neuropathy, hypersomnia, rm 11, alone    HPI:  Benjamin Hodge is a 74 y.o. male ,seen here as a  revisit  from Dr. Chilton Si for hypersomnia, circadian rhythm disorder, inability to stay awake . Marland Kitchen  Last visit ;  Benjamin Hodge has trouble to keep awake, will have irregular sleep and wake rhythm.  We have frequently discussed sleep hygiene, and the need to avoid naps in daytime , but he still drinks caffeine .  He feels that caffeine not longer helps him to stay awake, he will "crash and burn "  and will get  fatigued , cannot safely drive - but will not fall asleep.  He crosses a point of not being able to sleep, but craving sleep. 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 This work does allow him not to fall asleep, since he is moving. By 21 hours He feels often paradoxically stimulated , a second wind" and he has  No trouble to stay awake until 2 AM- sleeps until 10 AM. Delayed sleep phase. He is not a free running circadian patient.  Benjamin Hodge and used a very interesting sentence today "The hardest thing I do in the day is getting up out of bed in the morning " , " the second hardest thing is finding my way to bed in the evening". The patient's past medical history also includes diabetic neuropathy, he had a normal echocardiogram in May 2011 ordered studies and 2012. He was worked up for possible TIA after he developed left facial and arm numbness , which resolved.  at the local emergency room. He has been doing well on Ritalin,  which has been allowing him to control his sleepiness somewhat. His employment situation has given him some freedom and were hours. He has a chronic anemia. The patient had a trial of testosterone replacement,  and he seems to recall that it actually helped his fatigue and he felt more energized after that. He will see the prescribing doctor and the near future. EDS- Epworth score  5-6 points, FSS  24 , GDS 2 points.   Interval history 07-10-14, He works week ends,  enjoys having still a purpose , something to go to.  He would like to work from 10 AM on, has trouble to get up earlier. He has trouble to sleep earlier than midnight.  Has made a conscious effort to advance his sleep time from 2-3 AM , but can function with sleep from midnight to 9 AM.   He would like to work Monday to Friday, but feels to old and impaired by diabetes.   Interval history 07-16-15 Benjamin Hodge has for the last 2 weeks been placed on a new antiepileptic medication, there were 2 witnessed seizures since I last saw him last year. There is a interesting observation that his daughter-in-law reports today that she left all his sleep aids out of his pillbox and the patient went to sleep anyway. My feeling is that we should discontinue any sleep aid that is not necessary. As to his daytime sleepiness he has great difficulties to establish a sleep wake rhythm. I would like for him to enjoy his second wind by reading or listening to  relaxing music but my goal would be that he is not exposed to screen lites neither from a laptop or smart phone or TV set at it seems to delay his bedtime further. He usually gets busy late at night starting laundry etc. this kind of delayed sleep cycle is sometimes seen with bipolar disorder.  His daughter-in-law also reports that the patient was 2 months ago seen at the [facility] crawling as he could not get up and walk. Apparently he has had several of these spells where he is unable to ambulate to weakness and losing control over his lower extremities. The next day he is often walking just fine. It is unclear what leads to the sudden loss of tone. Blood sugar or blood pressure seemed not to influence this. An  anatomical abnormality of the CNS spine or brain needs to be ruled out. The patient had undergone an EEG last August which was normal for age and gender. He was diagnosed with seizures this year.  I reviewed the CT scan from August of this year with the patient and his family member it shows significant frontal and temporal atrophy and widening of the sulci. Especially the frontal lobe appears shrunken. This would correlate with the observation in the family that his memory has been declining especially over the last 6 months. I do not see a seizure focus however. She was able to describe the seizure very well apparently Benjamin Hodge set on a barstool in the kitchen slowly slumped over and when his grandson tried to straighten him his arms scissored in tonic clonic convulsions. His eyes were deviated to the right upper field. He was started by Dr. Chilton Si on Keppra 500 mg twice a day after a second seizure occurred. I will increase the dose today to   twice a day I will leave him on Lyrica these 2 doses together should control his seizure disorder.    Review of Systems: Out of a complete 14 system review, the patient complains of only the following symptoms, and all other reviewed systems are negative. 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  Adult ADHD depression  Diabetes, Diabetic neuropathy.  Memory loss, frontal lobe and temporal lobe atrophy.    Social History   Social History  . Marital Status: Divorced    Spouse Name: [name]  . Number of Children: 1  . Years of Education: 15   Occupational History  . [employer]    Social History Main Topics  . Smoking status: Former Smoker    Types: Cigarettes    Quit date: 09/28/2004  . Smokeless tobacco: Never Used  . Alcohol Use: 0.0 oz/week    0 Standard drinks or equivalent per week     Comment: occ/social (moderate)  . Drug Use: No  . Sexual Activity: Not on file    Other Topics Concern  . Not on file   Social History Narrative   Patient is right handed, quit caffeinated beverages in 2005.   Works as a Museum/gallery conservator. One son. He is divorced.    Family History  Problem Relation Age of Onset  . Bone cancer Mother   . Breast cancer Mother   . Cancer Mother   . Lung cancer Father   . Cancer Father   . Breast cancer Sister   . Cancer Sister   . Lung cancer Brother   . Cancer Sister     type unknown  Past Medical History  Diagnosis Date  . CAD (coronary artery disease)     LAD 40-50% stenosis, first diagonal small 90% stenosis, circumflex 70% stenosis, OM 80% stenosis, right coronary artery 80-90% stenosis.  . Right upper lobe pneumonia 11/28/2008  . Anxiety 07/18/2008  . Irritable bowel syndrome 04/27/2008  . COPD (chronic obstructive pulmonary disease) (HCC) 02/27/2008  . B12 deficiency 02/16/2008    in 5/09: B12 262, MMA 1040  . Chronic diarrhea 01/01/2008    s/p EGD 10/06: H pylori + gastritis, duodenal biopsy normal (Dr. Elnoria Howard); s/p colonoscopy 10/06: hemorrhoids (Dr. Elnoria Howard); evaluated by Dr. Yancey Flemings  in 8/09: tissue transglutaminase AB negative, VIP normal, stool fat content normal, urine 5-HIAA normal; normal GES 11/09 (done for "fluctuating sugars")  . 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 Orthostatic hypotension 02/09/2007  . Hypertension 02/09/2007  . Spinal stenosis in cervical region 05/03    MRI  . Erectile dysfunction 09/27/2006    s/p penile implant  . Anemia, iron deficiency 09/27/2006    neg colonoscopy 2006 - Dr. Elnoria Howard; ferritin 152; hgb  15.7  on 07/07  . Hypothyroidism 09/27/2006    TSH 2.639  07/07  . Hypersomnia 09/27/2006    evaluated by Dr. Jetty Duhamel (5/08) and Dr. Vickey Huger; PSG 8/09: chronic delayed sleep phase syndrome (patient sleeps during the day and is awake at night), nocturnal myoclonus (eval for RLS and IDA suggested). Pt advised to change sleeping behavior  .  Diabetes mellitus type II 09/07/1984    poorly controlled, complicated by peripheral neuropathy, microalbuminuria, mild non proliferative retinopathy, s/p DKA 7/05, on insulin pump x 4/09, started a pump vacation on 11/19/2008  . Hyperlipidemia   . 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 Hemorrhoids   . Chronic kidney disease   . Unspecified hereditary and idiopathic peripheral neuropathy   . Other chronic pain   . Other malaise and fatigue   . Depression   . Hypertrophy of prostate without urinary obstruction and other lower urinary tract symptoms (LUTS)   . Lumbago   . Heart attack (HCC) 03/2014    mild    Past Surgical History  Procedure Laterality Date  . Penile prosthesis implant    . Tonsillectomy    . Colonoscopy  06/25/2005    Dr. Jeani Hawking  . Left heart catheterization with coronary angiogram [name] 03/13/2014    Procedure: LEFT HEART CATHETERIZATION WITH CORONARY ANGIOGRAM;  Surgeon: Peter M Swaziland, MD;  Location: [facility] CATH LAB;  Service: Cardiovascular;  Laterality: [name];  . Peripheral vascular catheterization [name] 02/19/2015    Procedure: Abdominal Aortogram;  Surgeon: Nada Libman, MD;  Location: [facility] INVASIVE CV LAB;  Service: Cardiovascular;  Laterality: [name];  . Peripheral vascular catheterization Left 02/19/2015    Procedure: Lower Extremity Angiography;  Surgeon: Nada Libman, MD;  [facility] INVASIVE CV LAB;  Service: Cardiovascular;  Laterality: Left;    Current Outpatient Prescriptions  Medication Sig Dispense Refill  . aspirin 81 MG tablet Take 81 mg by mouth daily.     . clopidogrel (PLAVIX) 75 MG tablet TAKE 1 TABLET BY MOUTH DAILY WITH BREAKFAST 30 tablet 3  . diphenoxylate-atropine (LOMOTIL) 2.5-0.025 MG per tablet TAKE ONE OR TWO TABLETS BY MOUTH TWICE DAILY AS NEEDED 60 tablet 0  . escitalopram (LEXAPRO) 20 MG tablet TAKE ONE TABLET BY MOUTH ONE TIME DAILY 30 tablet 5  . HYDROcodone-acetaminophen (NORCO/VICODIN) 5-325 MG tablet Take one tablet by mouth five times daily  as needed for pain 150 tablet 0  .  Insulin Glargine (TOUJEO SOLOSTAR) 300 UNIT/ML SOPN Inject 12 Units into the skin at bedtime.     . insulin lispro (HUMALOG) 100 UNIT/ML KiwkPen Inject 1-6 Units into the skin daily as needed (100-199 = 1 units, 200-299 = 2 units, 300-399 = 3 units, 400-499 = 4 units, 500-599 = 5 units, 600+ = 6 units).     . levETIRAcetam (KEPPRA) 500 MG tablet One twice daily to control seizures 60 tablet 5  . levothyroxine (SYNTHROID, LEVOTHROID) 75 MCG tablet Take 1 tablet (75 mcg total) by mouth daily. 30 tablet 5  . Melatonin 3 MG TABS Take 1 tablet by mouth at bedtime.    . methylphenidate (RITALIN) 20 MG tablet Take 20 mg by mouth every morning.    . metoprolol tartrate (LOPRESSOR) 25 MG tablet One twice daily to control BP (Patient taking differently: Take 25 mg by mouth 2 (two) times daily. One twice daily to control BP) 60 tablet 5  . Multiple Vitamin (MULTIVITAMIN WITH MINERALS) TABS tablet Take 1 tablet by mouth daily. 30 tablet 3  . ONE TOUCH ULTRA TEST test strip TEST 8 TIMES PER DAY FOR 30 DAYS 250 each 4  . pantoprazole (PROTONIX) 40 MG tablet TAKE ONE TABLET BY MOUTH ONE TIME DAILY 30 tablet 5  . pravastatin (PRAVACHOL) 20 MG tablet Take 1 tablet (20 mg total) by mouth every evening. 90 tablet 3  . pregabalin (LYRICA) 50 MG capsule Take 1 capsule (50 mg total) by mouth 3 (three) times daily. (Patient taking differently: 1 tab every morning) 90 capsule 0  . saccharomyces boulardii (FLORASTOR) 250 MG capsule Take 1 capsule (250 mg total) by mouth 2 (two) times daily. 60 capsule 0  . testosterone cypionate (DEPOTESTOTERONE CYPIONATE) 200 MG/ML injection Inject 200 mg every 14 days 10 mL 0  . vitamin B-12 (CYANOCOBALAMIN) 1000 MCG tablet Take 1,000 mcg by mouth daily.     No current facility-administered medications for this visit.    Allergies as of 07/16/2015 - Review Complete 07/16/2015  Allergen Reaction Noted  . Lipitor [atorvastatin] Other (See Comments)  07/10/2014  . Ace inhibitors Other (See Comments) 01/23/2013  . Angiotensin receptor blockers Other (See Comments) 01/23/2013  . Bee venom Swelling 01/16/2013  . Cymbalta [duloxetine hcl] Other (See Comments) 01/23/2013    Vitals: BP 132/60 mmHg  Pulse 78  Resp 20  Ht 5\' 7"  (1.702 m)  Wt 127 lb (57.607 kg)  BMI 19.89 kg/m2 Last Weight:  Wt Readings from Last 1 Encounters:  07/16/15 127 lb (57.607 kg)   Last Height:   Ht Readings from Last 1 Encounters:  07/16/15 5\' 7"  (1.702 m)    Physical exam:  General: The patient is awake, alert and appears not in acute distress. The patient is well groomed. Head: Normocephalic, atraumatic.  Pale facial skin, pre-aged, Neck is supple. Mallampati 3, neck circumference 13.5 .  Cardiovascular:  Regular rate and rhythm, without  murmurs or carotid bruit, and without distended neck veins. Respiratory: Lungs are clear to auscultation. Skin:  Without evidence of edema, or rash Trunk: BMI - he lost weight. Has  normal posture.  Neurologic exam : The patient is awake and alert, oriented to place and time. Memory subjective described as intact. His family is concerned about cognitive decline.  There is a normal attention span & concentration ability. Speech is fluent without dysarthria, dysphonia or aphasia. Mood and affect are appropriate.  Cranial nerves: Pupils are equal and briskly reactive to light. Funduscopic exam without  evidence  of pallor or edema.  His right eye has mild ptosis,  Enophthalmos . his left eyebrow can be lifted higher than his right . Extraocular movements  in vertical and horizontal planes intact and without nystagmus.  Visual fields by finger perimetry are intact. Hearing to finger rub intact.  Tongue and uvula move midline.  Motor exam:  Normal tone and normal muscle bulk and symmetric normal strength in all extremities.  Sensory:  Fine touch, pinprick and vibration were reduced in both feet- Proprioception is  normal.  Coordination: Rapid alternating movements in the fingers/hands is tested and normal.  Finger-to-nose maneuver tested and normal without evidence of ataxia, dysmetria or tremor.  Gait and station: Patient walks without assistive device . Strength within normal limits.  Stance is stable and normal.  Deep tendon reflexes: in the  upper and lower extremities are attenuated - Babinski maneuver equivocal.   Assessment: 45 minute expanded visit:   Refills for stimulant. This patient has been using stimulants for decades and is dependent on these. He is dependent on Codeine for pain control.   Plan:  Treatment plan and additional workup :  1)Hypersomnia with prolonged sleep time.   sleep hygiene implementation-  Discussed again and again;  ESTABLISH a BED TIME and a STRICT RISE TIME- adhere to it, even if you didn't sleep. Reduce hydrocodone.    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  Gastroparesis is part of  small fiber neuropathy.  Talk to Dr Lucianne Muss.I like for the patient to try only 5 Mg Reglan at lunch time with a snack. Talk to Dr Milinda Pointer about Reglan or Zofran ,    3) dementia : The patients CT scan documented severe atrophy , and he has a cataract . He has had 2 seizures - he is not to drive for 6 month after a seizure , until April. Patient and family accepted this .    4) seizure ; increased Keppra to 750 mg twice a day and keep taking Lyrica.    5) failed back, chronic back pain , followed by Dr Chilton Si.    Danity Schmelzer, MD   Rv in 4 weeks with Np and for MOCA>   Cc Dr. Chilton Si.

## 2015-07-16 NOTE — Telephone Encounter (Signed)
I called the patient to provide contact info for Lyrica Patient Assistance, [phone].  Got no answer.  Left message.

## 2015-07-16 NOTE — Telephone Encounter (Signed)
Pt is inquiring about patient assistance for Lyrica. He takes 50 mg TID. Pt has little to no income. Please call if you have suggestions for him!  Thanks!

## 2015-07-17 ENCOUNTER — Telehealth: Payer: Self-pay

## 2015-07-17 NOTE — Telephone Encounter (Signed)
Patient has a pending appointment with you next week for 3 week follow-up on medication. Patient was seen by the neurologist and Keppra was increased to 750 mg bid vs 500 mg bid. Patient will see neurologist in 4 weeks for a memory screening test. Please advise if patient still needs to keep pending appointment with you.

## 2015-07-18 NOTE — Telephone Encounter (Signed)
I think it would be safe to advance his appt, to Feb 2017.

## 2015-07-18 NOTE — Telephone Encounter (Signed)
Spoke with Thayer Ohm, appointment was rescheduled to 10/16/2015.

## 2015-07-22 ENCOUNTER — Ambulatory Visit: Payer: Self-pay | Admitting: Internal Medicine

## 2015-07-23 ENCOUNTER — Telehealth: Payer: Self-pay | Admitting: Neurology

## 2015-07-23 DIAGNOSIS — H2511 Age-related nuclear cataract, right eye: Secondary | ICD-10-CM | POA: Diagnosis not present

## 2015-07-23 DIAGNOSIS — F0390 Unspecified dementia without behavioral disturbance: Secondary | ICD-10-CM

## 2015-07-23 DIAGNOSIS — G40209 Localization-related (focal) (partial) symptomatic epilepsy and epileptic syndromes with complex partial seizures, not intractable, without status epilepticus: Secondary | ICD-10-CM

## 2015-07-23 DIAGNOSIS — H2512 Age-related nuclear cataract, left eye: Secondary | ICD-10-CM | POA: Diagnosis not present

## 2015-07-23 DIAGNOSIS — G40A09 Absence epileptic syndrome, not intractable, without status epilepticus: Secondary | ICD-10-CM

## 2015-07-23 DIAGNOSIS — H25011 Cortical age-related cataract, right eye: Secondary | ICD-10-CM | POA: Diagnosis not present

## 2015-07-23 DIAGNOSIS — H02839 Dermatochalasis of unspecified eye, unspecified eyelid: Secondary | ICD-10-CM | POA: Diagnosis not present

## 2015-07-23 MED ORDER — LEVETIRACETAM 750 MG PO TABS: ORAL_TABLET | ORAL | Status: DC

## 2015-07-23 NOTE — Telephone Encounter (Signed)
Spoke to pt's daughter in Social worker. Pt has been extremely sleepy after increasing his keppra dose to 750 mg BID. Pt sleeps up to almost 20.5  hours at a time! He went to bed at 12 midnight on Sunday night, and did not wake up until Monday night at 8:30pm. Pt is even sleeping through his alarm, he does not hear it go off.  Pt's daughter in law states that they are encouraging a strict bedtime and rise time, but with him being so sleepy, this has been difficult to maintain. Pt's family is going to buy him an alarm clock with lights to help wake him up.  Pt's daughter in law wants to know if Dr. Vickey Huger has any other suggestions to help reduce his sleepiness from the Keppra. I advised her that either I or Dr. Vickey Huger would call her back with recommendations. I again encouraged a strict bed time and rise time and a consistent schedule.

## 2015-07-23 NOTE — Telephone Encounter (Signed)
Lets reduce the keppra to 750 mg at night and a half tab in AM - about 1100 mg daily. CD

## 2015-07-23 NOTE — Addendum Note (Signed)
Addended by: Geronimo Running A on: 07/23/2015 01:26 PM   Modules accepted: Orders

## 2015-07-23 NOTE — Telephone Encounter (Signed)
Spoke to pt's daughter in Social worker. Advised her that Dr. Vickey Huger reduced the Keppra to 750 mg at night and 1/2 tablet in the morning. Pt's daughter in law said that she will try that and let us know how the pt does with it at their appt with Aundra Millet, NP in December.  Keppra instructions updated in meds & orders.

## 2015-07-23 NOTE — Telephone Encounter (Signed)
Pt's dgt in law says since increasing anit-seizure medication he is very sleepy. She states he slept from 12 midnight till 830pm. Is there anything to do differently. Please call and advise [phone], Trula Ore

## 2015-07-24 ENCOUNTER — Ambulatory Visit: Payer: Medicare Other | Admitting: Internal Medicine

## 2015-08-07 ENCOUNTER — Other Ambulatory Visit: Payer: Self-pay | Admitting: Internal Medicine

## 2015-08-13 ENCOUNTER — Ambulatory Visit: Payer: Medicare Other | Admitting: Adult Health

## 2015-08-13 DIAGNOSIS — E1042 Type 1 diabetes mellitus with diabetic polyneuropathy: Secondary | ICD-10-CM | POA: Diagnosis not present

## 2015-08-13 DIAGNOSIS — E1065 Type 1 diabetes mellitus with hyperglycemia: Secondary | ICD-10-CM | POA: Diagnosis not present

## 2015-08-13 DIAGNOSIS — R634 Abnormal weight loss: Secondary | ICD-10-CM | POA: Diagnosis not present

## 2015-08-13 DIAGNOSIS — I739 Peripheral vascular disease, unspecified: Secondary | ICD-10-CM | POA: Diagnosis not present

## 2015-08-13 DIAGNOSIS — Z794 Long term (current) use of insulin: Secondary | ICD-10-CM | POA: Diagnosis not present

## 2015-08-13 DIAGNOSIS — I959 Hypotension, unspecified: Secondary | ICD-10-CM | POA: Diagnosis not present

## 2015-08-13 DIAGNOSIS — R531 Weakness: Secondary | ICD-10-CM | POA: Diagnosis not present

## 2015-08-14 ENCOUNTER — Telehealth: Payer: Self-pay | Admitting: *Deleted

## 2015-08-14 ENCOUNTER — Telehealth: Payer: Self-pay

## 2015-08-14 MED ORDER — PREGABALIN 50 MG PO CAPS
ORAL_CAPSULE | ORAL | Status: DC
Start: 2015-08-14 — End: 2015-11-19

## 2015-08-14 NOTE — Telephone Encounter (Signed)
Called Zyion Smialek, daughter [phone] to pick up form and rx.

## 2015-08-14 NOTE — Telephone Encounter (Addendum)
Daughter, Lamarius Curro dropped off Enrollment form for Assistance with Lyrica. Needs Dr. Chilton Si to sign form and a Hard copy of Rx for Lyrica and a copy of Medication/Allergy list. Printed Rx and Medication/Allergy list and given to Dr. Chilton Si to review and sign.

## 2015-08-14 NOTE — Telephone Encounter (Signed)
Called son [name] to offer earlier appt time on 08/15/2015. No answer.

## 2015-08-15 ENCOUNTER — Other Ambulatory Visit: Payer: Self-pay | Admitting: *Deleted

## 2015-08-15 ENCOUNTER — Ambulatory Visit: Payer: Medicare Other | Admitting: Adult Health

## 2015-08-15 MED ORDER — HYDROCODONE-ACETAMINOPHEN 5-325 MG PO TABS: ORAL_TABLET | ORAL | Status: DC

## 2015-08-15 NOTE — Telephone Encounter (Signed)
Patient requested and will pick up 

## 2015-08-19 ENCOUNTER — Encounter: Payer: Self-pay | Admitting: Adult Health

## 2015-09-05 ENCOUNTER — Encounter: Payer: Self-pay | Admitting: Vascular Surgery

## 2015-09-06 ENCOUNTER — Inpatient Hospital Stay (HOSPITAL_COMMUNITY): Payer: Medicare Other

## 2015-09-06 ENCOUNTER — Encounter (HOSPITAL_COMMUNITY): Payer: Self-pay | Admitting: *Deleted

## 2015-09-06 ENCOUNTER — Inpatient Hospital Stay (HOSPITAL_COMMUNITY)
Admission: EM | Admit: 2015-09-06 | Discharge: 2015-09-08 | DRG: 638 | Disposition: A | Discharge status: Home / Self Care | Payer: Medicare Other | Attending: Internal Medicine | Admitting: Internal Medicine

## 2015-09-06 DIAGNOSIS — Z888 Allergy status to other drugs, medicaments and biological substances status: Secondary | ICD-10-CM

## 2015-09-06 DIAGNOSIS — Z9641 Presence of insulin pump (external) (internal): Secondary | ICD-10-CM | POA: Diagnosis present

## 2015-09-06 DIAGNOSIS — I129 Hypertensive chronic kidney disease with stage 1 through stage 4 chronic kidney disease, or unspecified chronic kidney disease: Secondary | ICD-10-CM | POA: Diagnosis present

## 2015-09-06 DIAGNOSIS — E103299 Type 1 diabetes mellitus with mild nonproliferative diabetic retinopathy without macular edema, unspecified eye: Secondary | ICD-10-CM | POA: Diagnosis present

## 2015-09-06 DIAGNOSIS — Z87891 Personal history of nicotine dependence: Secondary | ICD-10-CM | POA: Diagnosis not present

## 2015-09-06 DIAGNOSIS — Z9114 Patient's other noncompliance with medication regimen: Secondary | ICD-10-CM | POA: Diagnosis not present

## 2015-09-06 DIAGNOSIS — Z801 Family history of malignant neoplasm of trachea, bronchus and lung: Secondary | ICD-10-CM

## 2015-09-06 DIAGNOSIS — Z7982 Long term (current) use of aspirin: Secondary | ICD-10-CM | POA: Diagnosis not present

## 2015-09-06 DIAGNOSIS — N183 Chronic kidney disease, stage 3 (moderate): Secondary | ICD-10-CM | POA: Diagnosis not present

## 2015-09-06 DIAGNOSIS — R569 Unspecified convulsions: Secondary | ICD-10-CM | POA: Diagnosis not present

## 2015-09-06 DIAGNOSIS — E1042 Type 1 diabetes mellitus with diabetic polyneuropathy: Secondary | ICD-10-CM | POA: Diagnosis present

## 2015-09-06 DIAGNOSIS — Z803 Family history of malignant neoplasm of breast: Secondary | ICD-10-CM

## 2015-09-06 DIAGNOSIS — I959 Hypotension, unspecified: Secondary | ICD-10-CM | POA: Diagnosis not present

## 2015-09-06 DIAGNOSIS — D649 Anemia, unspecified: Secondary | ICD-10-CM | POA: Diagnosis present

## 2015-09-06 DIAGNOSIS — E860 Dehydration: Secondary | ICD-10-CM | POA: Diagnosis not present

## 2015-09-06 DIAGNOSIS — E785 Hyperlipidemia, unspecified: Secondary | ICD-10-CM | POA: Diagnosis present

## 2015-09-06 DIAGNOSIS — E1022 Type 1 diabetes mellitus with diabetic chronic kidney disease: Secondary | ICD-10-CM | POA: Diagnosis present

## 2015-09-06 DIAGNOSIS — K219 Gastro-esophageal reflux disease without esophagitis: Secondary | ICD-10-CM | POA: Diagnosis not present

## 2015-09-06 DIAGNOSIS — E039 Hypothyroidism, unspecified: Secondary | ICD-10-CM | POA: Diagnosis present

## 2015-09-06 DIAGNOSIS — R262 Difficulty in walking, not elsewhere classified: Secondary | ICD-10-CM | POA: Diagnosis not present

## 2015-09-06 DIAGNOSIS — I2510 Atherosclerotic heart disease of native coronary artery without angina pectoris: Secondary | ICD-10-CM | POA: Diagnosis present

## 2015-09-06 DIAGNOSIS — E1051 Type 1 diabetes mellitus with diabetic peripheral angiopathy without gangrene: Secondary | ICD-10-CM | POA: Diagnosis not present

## 2015-09-06 DIAGNOSIS — R059 Cough, unspecified: Secondary | ICD-10-CM

## 2015-09-06 DIAGNOSIS — E1110 Type 2 diabetes mellitus with ketoacidosis without coma: Secondary | ICD-10-CM | POA: Diagnosis present

## 2015-09-06 DIAGNOSIS — G40909 Epilepsy, unspecified, not intractable, without status epilepticus: Secondary | ICD-10-CM | POA: Diagnosis present

## 2015-09-06 DIAGNOSIS — G4700 Insomnia, unspecified: Secondary | ICD-10-CM

## 2015-09-06 DIAGNOSIS — Z79899 Other long term (current) drug therapy: Secondary | ICD-10-CM | POA: Diagnosis not present

## 2015-09-06 DIAGNOSIS — E1010 Type 1 diabetes mellitus with ketoacidosis without coma: Secondary | ICD-10-CM | POA: Diagnosis not present

## 2015-09-06 DIAGNOSIS — F1120 Opioid dependence, uncomplicated: Secondary | ICD-10-CM | POA: Diagnosis present

## 2015-09-06 DIAGNOSIS — I252 Old myocardial infarction: Secondary | ICD-10-CM

## 2015-09-06 DIAGNOSIS — Z9889 Other specified postprocedural states: Secondary | ICD-10-CM

## 2015-09-06 DIAGNOSIS — J449 Chronic obstructive pulmonary disease, unspecified: Secondary | ICD-10-CM | POA: Diagnosis present

## 2015-09-06 DIAGNOSIS — R05 Cough: Secondary | ICD-10-CM

## 2015-09-06 DIAGNOSIS — E038 Other specified hypothyroidism: Secondary | ICD-10-CM | POA: Diagnosis not present

## 2015-09-06 DIAGNOSIS — Z808 Family history of malignant neoplasm of other organs or systems: Secondary | ICD-10-CM

## 2015-09-06 DIAGNOSIS — Z794 Long term (current) use of insulin: Secondary | ICD-10-CM

## 2015-09-06 LAB — CBC
HCT: 25.9 % — ABNORMAL LOW (ref 39.0–52.0)
HEMATOCRIT: 28.8 % — AB (ref 39.0–52.0)
HEMOGLOBIN: 9.4 g/dL — AB (ref 13.0–17.0)
HEMOGLOBIN: 8.9 g/dL — AB (ref 13.0–17.0)
MCH: 31.8 pg (ref 26.0–34.0)
MCH: 31.6 pg (ref 26.0–34.0)
MCHC: 32.6 g/dL (ref 30.0–36.0)
MCHC: 34.4 g/dL (ref 30.0–36.0)
MCV: 97.3 fL (ref 78.0–100.0)
MCV: 91.8 fL (ref 78.0–100.0)
PLATELETS: 218 10*3/uL (ref 150–400)
Platelets: 244 10*3/uL (ref 150–400)
RBC: 2.96 MIL/uL — AB (ref 4.22–5.81)
RBC: 2.82 MIL/uL — ABNORMAL LOW (ref 4.22–5.81)
RDW: 12.7 % (ref 11.5–15.5)
RDW: 12.4 % (ref 11.5–15.5)
WBC: 6.8 10*3/uL (ref 4.0–10.5)
WBC: 6 10*3/uL (ref 4.0–10.5)

## 2015-09-06 LAB — I-STAT TROPONIN, ED: TROPONIN I, POC: 0.06 ng/mL (ref 0.00–0.08)

## 2015-09-06 LAB — BASIC METABOLIC PANEL
ANION GAP: 10 (ref 5–15)
BUN: 29 mg/dL — AB (ref 6–20)
CALCIUM: 8.4 mg/dL — AB (ref 8.9–10.3)
CO2: 26 mmol/L (ref 22–32)
Chloride: 102 mmol/L (ref 101–111)
Creatinine, Ser: 1.41 mg/dL — ABNORMAL HIGH (ref 0.61–1.24)
GFR calc Af Amer: 56 mL/min — ABNORMAL LOW (ref >60)
GFR, EST NON AFRICAN AMERICAN: 48 mL/min — AB (ref >60)
GLUCOSE: 248 mg/dL — AB (ref 65–99)
Potassium: 3.6 mmol/L (ref 3.5–5.1)
Sodium: 138 mmol/L (ref 135–145)

## 2015-09-06 LAB — COMPREHENSIVE METABOLIC PANEL
ALBUMIN: 3.3 g/dL — AB (ref 3.5–5.0)
ALK PHOS: 92 U/L (ref 38–126)
ALT: 27 U/L (ref 17–63)
ANION GAP: 25 — AB (ref 5–15)
AST: 27 U/L (ref 15–41)
BUN: 37 mg/dL — AB (ref 6–20)
CALCIUM: 9 mg/dL (ref 8.9–10.3)
CO2: 15 mmol/L — AB (ref 22–32)
Chloride: 89 mmol/L — ABNORMAL LOW (ref 101–111)
Creatinine, Ser: 2.37 mg/dL — ABNORMAL HIGH (ref 0.61–1.24)
GFR calc Af Amer: 30 mL/min — ABNORMAL LOW (ref >60)
GFR calc non Af Amer: 26 mL/min — ABNORMAL LOW (ref >60)
GLUCOSE: 651 mg/dL — AB (ref 65–99)
Potassium: 4.2 mmol/L (ref 3.5–5.1)
SODIUM: 129 mmol/L — AB (ref 135–145)
TOTAL PROTEIN: 6.3 g/dL — AB (ref 6.5–8.1)
Total Bilirubin: 1.6 mg/dL — ABNORMAL HIGH (ref 0.3–1.2)

## 2015-09-06 LAB — URINE MICROSCOPIC-ADD ON
Bacteria, UA: NONE SEEN
RBC / HPF: NONE SEEN RBC/hpf (ref 0–5)
WBC, UA: NONE SEEN WBC/hpf (ref 0–5)

## 2015-09-06 LAB — URINALYSIS, ROUTINE W REFLEX MICROSCOPIC
BILIRUBIN URINE: NEGATIVE
Bilirubin Urine: NEGATIVE
Glucose, UA: >1000 mg/dL — AB
HGB URINE DIPSTICK: NEGATIVE
Hgb urine dipstick: NEGATIVE
KETONES UR: 40 mg/dL — AB
Ketones, ur: 15 mg/dL — AB
LEUKOCYTES UA: NEGATIVE
Leukocytes, UA: NEGATIVE
NITRITE: NEGATIVE
Nitrite: NEGATIVE
PH: 5 (ref 5.0–8.0)
PROTEIN: NEGATIVE mg/dL
Protein, ur: NEGATIVE mg/dL
SPECIFIC GRAVITY, URINE: 1.024 (ref 1.005–1.030)
Specific Gravity, Urine: 1.018 (ref 1.005–1.030)
pH: 5 (ref 5.0–8.0)

## 2015-09-06 LAB — I-STAT CG4 LACTIC ACID, ED
LACTIC ACID, VENOUS: 4.61 mmol/L — AB (ref 0.5–2.0)
LACTIC ACID, VENOUS: 2.44 mmol/L — AB (ref 0.5–2.0)

## 2015-09-06 LAB — CBG MONITORING, ED
GLUCOSE-CAPILLARY: 496 mg/dL — AB (ref 65–99)
GLUCOSE-CAPILLARY: 405 mg/dL — AB (ref 65–99)
Glucose-Capillary: 493 mg/dL — ABNORMAL HIGH (ref 65–99)
Glucose-Capillary: 500 mg/dL — ABNORMAL HIGH (ref 65–99)
Glucose-Capillary: 368 mg/dL — ABNORMAL HIGH (ref 65–99)

## 2015-09-06 LAB — TSH: TSH: 0.878 u[IU]/mL (ref 0.350–4.500)

## 2015-09-06 LAB — MRSA PCR SCREENING: MRSA BY PCR: NEGATIVE

## 2015-09-06 LAB — MAGNESIUM: MAGNESIUM: 1.6 mg/dL — AB (ref 1.7–2.4)

## 2015-09-06 MED ORDER — DEXTROSE-NACL 5-0.45 % IV SOLN
INTRAVENOUS | Status: DC
  Administered 2015-09-06: 23:00:00 via INTRAVENOUS

## 2015-09-06 MED ORDER — LEVETIRACETAM 750 MG PO TABS
750.0000 mg | ORAL_TABLET | Freq: Every evening | ORAL | Status: DC | PRN
  Administered 2015-09-06 – 2015-09-07 (×2): 750 mg via ORAL
  Filled 2015-09-06 (×2): qty 1

## 2015-09-06 MED ORDER — ADULT MULTIVITAMIN W/MINERALS CH
1.0000 | ORAL_TABLET | Freq: Every day | ORAL | Status: DC
  Administered 2015-09-06 – 2015-09-08 (×3): 1 via ORAL
  Filled 2015-09-06 (×3): qty 1

## 2015-09-06 MED ORDER — ASPIRIN 81 MG PO CHEW
81.0000 mg | CHEWABLE_TABLET | Freq: Every day | ORAL | Status: DC
  Administered 2015-09-06 – 2015-09-08 (×3): 81 mg via ORAL
  Filled 2015-09-06 (×4): qty 1

## 2015-09-06 MED ORDER — SODIUM CHLORIDE 0.9 % IV SOLN
INTRAVENOUS | Status: DC
  Administered 2015-09-06: 4.4 [IU]/h via INTRAVENOUS
  Filled 2015-09-06: qty 2.5

## 2015-09-06 MED ORDER — PANTOPRAZOLE SODIUM 40 MG PO TBEC
40.0000 mg | DELAYED_RELEASE_TABLET | Freq: Every day | ORAL | Status: DC
  Administered 2015-09-06 – 2015-09-08 (×3): 40 mg via ORAL
  Filled 2015-09-06 (×3): qty 1

## 2015-09-06 MED ORDER — SODIUM CHLORIDE 0.9 % IV SOLN: 1000.0000 mL | INTRAVENOUS | Status: DC

## 2015-09-06 MED ORDER — PRAVASTATIN SODIUM 20 MG PO TABS
20.0000 mg | ORAL_TABLET | Freq: Every evening | ORAL | Status: DC
  Administered 2015-09-06 – 2015-09-07 (×2): 20 mg via ORAL
  Filled 2015-09-06 (×2): qty 1

## 2015-09-06 MED ORDER — DEXTROSE 50 % IV SOLN: 25.0000 mL | INTRAVENOUS | Status: DC | PRN

## 2015-09-06 MED ORDER — SODIUM CHLORIDE 0.9 % IV SOLN: INTRAVENOUS | Status: DC

## 2015-09-06 MED ORDER — POTASSIUM CHLORIDE 10 MEQ/100ML IV SOLN
10.0000 meq | INTRAVENOUS | Status: AC
  Administered 2015-09-06 (×2): 10 meq via INTRAVENOUS
  Filled 2015-09-06 (×2): qty 100

## 2015-09-06 MED ORDER — SODIUM CHLORIDE 0.9 % IV SOLN
1000.0000 mL | Freq: Once | INTRAVENOUS | Status: AC
  Administered 2015-09-06: 1000 mL via INTRAVENOUS

## 2015-09-06 MED ORDER — SODIUM CHLORIDE 0.9 % IV SOLN: INTRAVENOUS | Status: AC

## 2015-09-06 MED ORDER — SODIUM CHLORIDE 0.9 % IV SOLN
INTRAVENOUS | Status: DC
  Administered 2015-09-07: 5.4 [IU]/h via INTRAVENOUS
  Administered 2015-09-07: 9.5 [IU]/h via INTRAVENOUS
  Filled 2015-09-06: qty 2.5

## 2015-09-06 MED ORDER — ENOXAPARIN SODIUM 30 MG/0.3ML ~~LOC~~ SOLN
30.0000 mg | SUBCUTANEOUS | Status: DC
  Administered 2015-09-06 – 2015-09-07 (×2): 30 mg via SUBCUTANEOUS
  Filled 2015-09-06 (×2): qty 0.3

## 2015-09-06 MED ORDER — DEXTROSE-NACL 5-0.45 % IV SOLN: INTRAVENOUS | Status: DC

## 2015-09-06 MED ORDER — SODIUM CHLORIDE 0.9 % IV SOLN
INTRAVENOUS | Status: DC
  Administered 2015-09-06 – 2015-09-07 (×2): via INTRAVENOUS

## 2015-09-06 MED ORDER — ESCITALOPRAM OXALATE 10 MG PO TABS
20.0000 mg | ORAL_TABLET | Freq: Every day | ORAL | Status: DC
  Administered 2015-09-06 – 2015-09-08 (×3): 20 mg via ORAL
  Filled 2015-09-06 (×5): qty 2

## 2015-09-06 MED ORDER — CLOPIDOGREL BISULFATE 75 MG PO TABS
75.0000 mg | ORAL_TABLET | Freq: Every day | ORAL | Status: DC
  Administered 2015-09-07 – 2015-09-08 (×2): 75 mg via ORAL
  Filled 2015-09-06 (×2): qty 1

## 2015-09-06 MED ORDER — SODIUM CHLORIDE 0.9 % IV SOLN: 1000.0000 mL | Freq: Once | INTRAVENOUS | Status: DC

## 2015-09-06 MED ORDER — LEVOTHYROXINE SODIUM 75 MCG PO TABS
75.0000 ug | ORAL_TABLET | Freq: Every day | ORAL | Status: DC
  Administered 2015-09-07 – 2015-09-08 (×2): 75 ug via ORAL
  Filled 2015-09-06 (×2): qty 1

## 2015-09-06 MED ORDER — SODIUM CHLORIDE 0.9 % IV BOLUS (SEPSIS)
2000.0000 mL | Freq: Once | INTRAVENOUS | Status: DC
Start: 2015-09-06 — End: 2015-09-06

## 2015-09-06 MED ORDER — INSULIN REGULAR BOLUS VIA INFUSION
0.0000 [IU] | Freq: Three times a day (TID) | INTRAVENOUS | Status: DC
  Filled 2015-09-06: qty 10

## 2015-09-06 MED ORDER — LEVETIRACETAM 100 MG/ML PO SOLN
375.0000 mg | Freq: Every morning | ORAL | Status: DC
  Administered 2015-09-07 – 2015-09-08 (×2): 380 mg via ORAL
  Filled 2015-09-06 (×3): qty 5

## 2015-09-06 MED ORDER — PREGABALIN 25 MG PO CAPS
50.0000 mg | ORAL_CAPSULE | Freq: Every day | ORAL | Status: DC
Start: 2015-09-06 — End: 2015-09-08
  Administered 2015-09-06 – 2015-09-07 (×2): 50 mg via ORAL
  Filled 2015-09-06 (×2): qty 2

## 2015-09-06 NOTE — ED Notes (Signed)
Son, Doreatha Martin, cell number: [phone]

## 2015-09-06 NOTE — ED Notes (Signed)
Pt reports extreme fatigue and weakness x 1 week, difficulty ambulating. Reports hyperglycemia today. Appears pale adn weak at triage, mild confusion. Reports baseline is living independently and ambulatory.

## 2015-09-06 NOTE — ED Notes (Signed)
Benjamin Hodge with case management contacted regarding pt and pt living situation, per son's (POA) request.

## 2015-09-06 NOTE — ED Notes (Signed)
Report attempted, RN to call back. 

## 2015-09-06 NOTE — ED Notes (Signed)
CBG 500 

## 2015-09-06 NOTE — H&P (Signed)
[facility] History and Physical  FIORE DETJEN ZOX:[id number] DOB: 07-21-1941 DOA: 09/06/2015  Referring physician: Dr. Mancel Bale, EDP PCP: Kimber Relic, MD  Specialists:   Chief Complaint: Weakness  HPI: Benjamin Hodge is a 74 y.o. male  With a history of diabetes mellitus, coronary artery disease, hypertension, presented to the emergency department for weakness. Patient states over the last several days he's been feeling weak and has not been using or taking most of his medications including his insulin. She is a poor historian. He stated he had not been taking his medications for proximally 20 days and then changed his story to 2-3 days. Patient states he's had decreased appetite. He is afraid of low blood sugars, therefore he has not taken his insulin. Patient also complains of having trouble walking. He denies any recent illness or travel. Does admit to having a dry cough for the past week. In the emergency department, patient was found to have a blood sugar of 651 with an anion gap 25, creatinine 2.37. Patient was started on insulin/glucose stabilizer. [facility] called for admission.  Review of Systems:  Constitutional: Complains of generalized fatigue and feeling weak. HEENT: Denies photophobia, eye pain, redness, hearing loss, ear pain, congestion, sore throat, rhinorrhea, sneezing, mouth sores, trouble swallowing, neck pain, neck stiffness and tinnitus.   Respiratory: Complains of occasional dry cough Cardiovascular: Denies chest pain, palpitations and leg swelling.  Gastrointestinal: Denies nausea, vomiting, abdominal pain, diarrhea, constipation, blood in stool and abdominal distention.  Genitourinary: Denies dysuria, urgency, frequency, hematuria, flank pain and difficulty urinating.  Musculoskeletal: Denies myalgias, back pain, joint swelling, arthralgias and gait problem.  Skin: Denies pallor, rash and wound.  Neurological: Complains of feeling  weak Hematological: Denies adenopathy. Easy bruising, personal or family bleeding history  Psychiatric/Behavioral: Denies suicidal ideation, mood changes, confusion, nervousness, sleep disturbance and agitation  Past Medical History  Diagnosis Date  . CAD (coronary artery disease)     LAD 40-50% stenosis, first diagonal small 90% stenosis, circumflex 70% stenosis, OM 80% stenosis, right coronary artery 80-90% stenosis.  . Right upper lobe pneumonia 11/28/2008  . Anxiety 07/18/2008  . Irritable bowel syndrome 04/27/2008  . COPD (chronic obstructive pulmonary disease) (HCC) 02/27/2008  . B12 deficiency 02/16/2008    in 5/09: B12 262, MMA 1040  . Chronic diarrhea 01/01/2008    s/p EGD 10/06: H pylori + gastritis, duodenal biopsy normal (Dr. Elnoria Howard); s/p colonoscopy 10/06: hemorrhoids (Dr. Elnoria Howard); evaluated by Dr. Yancey Flemings  in 8/09: tissue transglutaminase AB negative, VIP normal, stool fat content normal, urine 5-HIAA normal; normal GES 11/09 (done for "fluctuating sugars")  . 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 Orthostatic hypotension 02/09/2007  . Hypertension 02/09/2007  . Spinal stenosis in cervical region 05/03    MRI  . Erectile dysfunction 09/27/2006    s/p penile implant  . Anemia, iron deficiency 09/27/2006    neg colonoscopy 2006 - Dr. Elnoria Howard; ferritin 152; hgb  15.7  on 07/07  . Hypothyroidism 09/27/2006    TSH 2.639  07/07  . Hypersomnia 09/27/2006    evaluated by Dr. Jetty Duhamel (5/08) and Dr. Vickey Huger; PSG 8/09: chronic delayed sleep phase syndrome (patient sleeps during the day and is awake at night), nocturnal myoclonus (eval for RLS and IDA suggested). Pt advised to change sleeping behavior  . Diabetes mellitus type II 09/07/1984    poorly controlled, complicated by peripheral neuropathy, microalbuminuria, mild non proliferative retinopathy, s/p DKA 7/05, on insulin pump x 4/09, started a pump  vacation on 11/19/2008  . Hyperlipidemia   .  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 Hemorrhoids   . Chronic kidney disease   . Unspecified hereditary and idiopathic peripheral neuropathy   . Other chronic pain   . Other malaise and fatigue   . Depression   . Hypertrophy of prostate without urinary obstruction and other lower urinary tract symptoms (LUTS)   . Lumbago   . Heart attack (HCC) 03/2014    mild   Past Surgical History  Procedure Laterality Date  . Penile prosthesis implant    . Tonsillectomy    . Colonoscopy  06/25/2005    Dr. Jeani Hawking  . Left heart catheterization with coronary angiogram [name] 03/13/2014    Procedure: LEFT HEART CATHETERIZATION WITH CORONARY ANGIOGRAM;  Surgeon: Peter M Swaziland, MD;  Location: [facility] CATH LAB;  Service: Cardiovascular;  Laterality: [name];  . Peripheral vascular catheterization [name] 02/19/2015    Procedure: Abdominal Aortogram;  Surgeon: Nada Libman, MD;  Location: [facility] INVASIVE CV LAB;  Service: Cardiovascular;  Laterality: [name];  . Peripheral vascular catheterization Left 02/19/2015    Procedure: Lower Extremity Angiography;  Surgeon: Nada Libman, MD;  Location: [facility] INVASIVE CV LAB;  Service: Cardiovascular;  Laterality: Left;   Social History:  reports that he quit smoking about 10 years ago. His smoking use included Cigarettes. He has never used smokeless tobacco. He reports that he drinks alcohol. He reports that he does not use illicit drugs.   Allergies  Allergen Reactions  . Lipitor [Atorvastatin] Other (See Comments)    Caused pain all over body.  . Ace Inhibitors Other (See Comments)    dizzy  . Angiotensin Receptor Blockers Other (See Comments)    unknown  . Bee Venom Swelling  . Cymbalta [Duloxetine Hcl] Other (See Comments)    dizzy    Family History  Problem Relation Age of Onset  . Bone cancer Mother   . Breast cancer Mother   . Cancer Mother   . Lung cancer Father   . Cancer Father   . Breast cancer Sister   . Cancer Sister   . Lung cancer Brother   . Cancer Sister       type unknown   Prior to Admission medications   Medication Sig Start Date End Date Taking? Authorizing Provider  aspirin 81 MG tablet Take 81 mg by mouth daily.    Yes Historical Provider, MD  clopidogrel (PLAVIX) 75 MG tablet TAKE 1 TABLET BY MOUTH DAILY WITH BREAKFAST 06/11/15  Yes Kimber Relic, MD  diphenoxylate-atropine (LOMOTIL) 2.5-0.025 MG per tablet TAKE ONE OR TWO TABLETS BY MOUTH TWICE DAILY AS NEEDED 03/05/15  Yes Kimber Relic, MD  escitalopram (LEXAPRO) 20 MG tablet TAKE ONE TABLET BY MOUTH ONE TIME DAILY 08/07/15  Yes Kimber Relic, MD  HYDROcodone-acetaminophen (NORCO/VICODIN) 5-325 MG tablet Take one tablet by mouth five times daily as needed for pain 08/15/15  Yes [name] L [name], DO  Insulin Glargine (TOUJEO SOLOSTAR) 300 UNIT/ML SOPN Inject 12 Units into the skin at bedtime.    Yes Historical Provider, MD  insulin lispro (HUMALOG) 100 UNIT/ML KiwkPen Inject 1-6 Units into the skin daily as needed (100-199 = 1 units, 200-299 = 2 units, 300-399 = 3 units, 400-499 = 4 units, 500-599 = 5 units, 600+ = 6 units).  03/16/13  Yes Reather Littler, MD  levETIRAcetam (KEPPRA) 750 MG tablet Take 1/2 tablet in morning. Take whole tablet at night. 07/23/15  Yes Melvyn Novas, MD  levothyroxine (SYNTHROID, LEVOTHROID) 75 MCG tablet Take 1 tablet (75 mcg total) by mouth daily. 05/10/15  Yes Kimber Relic, MD  metoprolol tartrate (LOPRESSOR) 25 MG tablet One twice daily to control BP Patient taking differently: Take 25 mg by mouth 2 (two) times daily. One twice daily to control BP 05/29/15  Yes Kimber Relic, MD  Multiple Vitamin (MULTIVITAMIN WITH MINERALS) TABS tablet Take 1 tablet by mouth daily. 02/22/15  Yes Osvaldo Shipper, MD  pantoprazole (PROTONIX) 40 MG tablet TAKE ONE TABLET BY MOUTH ONE TIME DAILY 05/14/15  Yes Kimber Relic, MD  pravastatin (PRAVACHOL) 20 MG tablet Take 1 tablet (20 mg total) by mouth every evening. 07/06/14  Yes Rollene Rotunda, MD  pregabalin (LYRICA) 50 MG capsule Take  one capsule by mouth in the morning and two capsules by mouth in the evening for pains. 08/14/15  Yes Kimber Relic, MD  saccharomyces boulardii (FLORASTOR) 250 MG capsule Take 1 capsule (250 mg total) by mouth 2 (two) times daily. 02/22/15  Yes Osvaldo Shipper, MD  vitamin B-12 (CYANOCOBALAMIN) 1000 MCG tablet Take 1,000 mcg by mouth daily.   Yes Historical Provider, MD  ONE TOUCH ULTRA TEST test strip TEST 8 TIMES PER DAY FOR 30 DAYS 04/26/14   Reather Littler, MD   Physical Exam: Filed Vitals:   09/06/15 1645 09/06/15 1700  BP: 126/52 108/57  Pulse: 69 65  Resp: 14 18     General: Well developed, malnourished, NAD, appears stated age  HEENT: NCAT, PERRLA, EOMI, Anicteic Sclera, mucous membranes dry.   Neck: Supple, no JVD, no masses  Cardiovascular: S1 S2 auscultated, no rubs, murmurs or gallops. Regular rate and rhythm.  Respiratory: Clear to auscultation bilaterally with equal chest rise  Abdomen: Soft, nontender, nondistended, + bowel sounds  Extremities: warm dry without cyanosis clubbing or edema  Neuro: AAOx3, cranial nerves grossly intact. Strength equal and bilateral in upper/lower ext  Skin: Without rashes exudates or nodules  Psych: Normal affect and demeanor  Labs on Admission:  Basic Metabolic Panel:  Recent Labs Lab 09/06/15 1504  NA 129*  K 4.2  CL 89*  CO2 15*  GLUCOSE 651*  BUN 37*  CREATININE 2.37*  CALCIUM 9.0   Liver Function Tests:  Recent Labs Lab 09/06/15 1504  AST 27  ALT 27  ALKPHOS 92  BILITOT 1.6*  PROT 6.3*  ALBUMIN 3.3*   No results for input(s): LIPASE, AMYLASE in the last 168 hours. No results for input(s): AMMONIA in the last 168 hours. CBC:  Recent Labs Lab 09/06/15 1504  WBC 6.8  HGB 9.4*  HCT 28.8*  MCV 97.3  PLT 244   Cardiac Enzymes: No results for input(s): CKTOTAL, CKMB, CKMBINDEX, TROPONINI in the last 168 hours.  BNP (last 3 results) No results for input(s): BNP in the last 8760 hours.  ProBNP (last 3  results) No results for input(s): PROBNP in the last 8760 hours.  CBG:  Recent Labs Lab 09/06/15 1510 09/06/15 1606  GLUCAP 493* 500*    Radiological Exams on Admission: No results found.  EKG: Independently reviewed. Sinus rhythm, rate 73  Assessment/Plan  Diabetic ketoacidosis/Uncontrolled diabetes mellitus, type 1 -Patient will be admitted to stepdown unit -Will continue IV fluids, glucose stabilizer, check BMP every 4 hours, monitor CBGs -Patient will be NPO -Last hemoglobin A1c 10.6 on 06/28/2015 -Will consult diabetes coordinator -Likely secondary to noncompliance with insulin versus possible underlying infection as patient states he has not been feeling well -Will obtain chest x-ray  and urine analysis culture  Acute on chronic kidney disease, stage III -Baseline creatinine 1.6, currently 2.37 -Likely secondary to DKA and dehydration -Continue treatment and plan as above -Will monitor BMP  Lactic acidosis/anion gap metabolic acidosis -Secondary to the above, will continue to monitor lactic acid and BMP   Hypotension -Likely secondary to dehydration -Hold antihypertensives -Continue IV fluids  Dehydration/poor oral intake -Patient states he's been feeling unwell for the last several days -Treatment plan as above  Generalized weakness -Likely secondary to DKA -Will consult PT OT -Obtain TSH  Hypothyroidism -Continue Synthroid, obtain TSH level  GERD -Continue PPI  Coronary artery disease -Currently chest pain-free, continue Plavix, aspirin, statin -Metoprolol held due to hypotension  Normocytic anemia -Baseline hemoglobin between 9 and 10 -Currently 9.6 -Continue to monitor CBC -Suspect hemoglobin may drop due to IV fluids  Seizure disorder -Continue Keppra  DVT prophylaxis:  Lovenox, reduced dose  Code Status: Full  Condition: Guarded  Family Communication: None at bedside. Admission, patients condition and plan of care including tests  being ordered have been discussed with the patient, who indicates understanding and agrees with the plan and Code Status.  Disposition Plan: Admitted  Time spent: 70 minutes  Keairra Bardon D.O. [facility] Pager [phone]  If 7PM-7AM, please contact night-coverage [email or web address] Password [email or web address] 09/06/2015, 5:25 PM

## 2015-09-06 NOTE — ED Provider Notes (Signed)
CSN: [id number]     Arrival date & time 09/06/15  1441 History   First MD Initiated Contact with Patient 09/06/15 1517     Chief Complaint  Patient presents with  . Weakness     (Consider location/radiation/quality/duration/timing/severity/associated sxs/prior Treatment) The history is provided by a relative.     .........Marland KitchenMANUELITO Hodge is a 74 y.o. male who presents for evaluation of weakness, trouble walking, and high blood sugar. He skipped his basal insulin the last 2 nights. He used sliding scale insulin once today, and once yesterday. No fever. He has been having trouble sleeping. His endocrinologist asked him to increase his evening insulin, but he decided not to do that. He states that he is "afraid of low blood sugar".  His son is here to give some history. The patient is unable to give a clear history.There are no other known modifyng factors.      Past Medical History  Diagnosis Date  . CAD (coronary artery disease)     LAD 40-50% stenosis, first diagonal small 90% stenosis, circumflex 70% stenosis, OM 80% stenosis, right coronary artery 80-90% stenosis.  . Right upper lobe pneumonia 11/28/2008  . Anxiety 07/18/2008  . Irritable bowel syndrome 04/27/2008  . COPD (chronic obstructive pulmonary disease) (HCC) 02/27/2008  . B12 deficiency 02/16/2008    in 5/09: B12 262, MMA 1040  . Chronic diarrhea 01/01/2008    s/p EGD 10/06: H pylori + gastritis, duodenal biopsy normal (Dr. Elnoria Howard); s/p colonoscopy 10/06: hemorrhoids (Dr. Elnoria Howard); evaluated by Dr. Yancey Flemings  in 8/09: tissue transglutaminase AB negative, VIP normal, stool fat content normal, urine 5-HIAA normal; normal GES 11/09 (done for "fluctuating sugars")  . 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 Orthostatic hypotension 02/09/2007  . Hypertension 02/09/2007  . Spinal stenosis in cervical region 05/03    MRI  . Erectile dysfunction 09/27/2006    s/p penile implant  . Anemia, iron  deficiency 09/27/2006    neg colonoscopy 2006 - Dr. Elnoria Howard; ferritin 152; hgb  15.7  on 07/07  . Hypothyroidism 09/27/2006    TSH 2.639  07/07  . Hypersomnia 09/27/2006    evaluated by Dr. Jetty Duhamel (5/08) and Dr. Vickey Huger; PSG 8/09: chronic delayed sleep phase syndrome (patient sleeps during the day and is awake at night), nocturnal myoclonus (eval for RLS and IDA suggested). Pt advised to change sleeping behavior  . Diabetes mellitus type II 09/07/1984    poorly controlled, complicated by peripheral neuropathy, microalbuminuria, mild non proliferative retinopathy, s/p DKA 7/05, on insulin pump x 4/09, started a pump vacation on 11/19/2008  . Hyperlipidemia   . 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 Hemorrhoids   . Chronic kidney disease   . Unspecified hereditary and idiopathic peripheral neuropathy   . Other chronic pain   . Other malaise and fatigue   . Depression   . Hypertrophy of prostate without urinary obstruction and other lower urinary tract symptoms (LUTS)   . Lumbago   . Heart attack (HCC) 03/2014    mild   Past Surgical History  Procedure Laterality Date  . Penile prosthesis implant    . Tonsillectomy    . Colonoscopy  06/25/2005    Dr. Jeani Hawking  . Left heart catheterization with coronary angiogram [name] 03/13/2014    Procedure: LEFT HEART CATHETERIZATION WITH CORONARY ANGIOGRAM;  Surgeon: Peter M Swaziland, MD;  Location: [facility] CATH LAB;  Service: Cardiovascular;  Laterality: [name];  . Peripheral vascular catheterization [name] 02/19/2015    Procedure: Abdominal Aortogram;  Surgeon: Nada Libman, MD;  Location: [facility] INVASIVE CV LAB;  Service: Cardiovascular;  Laterality: [name];  . Peripheral vascular catheterization Left 02/19/2015    Procedure: Lower Extremity Angiography;  Surgeon: Nada Libman, MD;  Location: [facility] INVASIVE CV LAB;  Service: Cardiovascular;  Laterality: Left;   Family History  Problem Relation Age of Onset  . Bone cancer Mother   . Breast cancer Mother   . Cancer  Mother   . Lung cancer Father   . Cancer Father   . Breast cancer Sister   . Cancer Sister   . Lung cancer Brother   . Cancer Sister     type unknown   Social History  Substance Use Topics  . Smoking status: Former Smoker    Types: Cigarettes    Quit date: 09/28/2004  . Smokeless tobacco: Never Used  . Alcohol Use: 0.0 oz/week    0 Standard drinks or equivalent per week     Comment: occ/social (moderate)    Review of Systems  All other systems reviewed and are negative.     Allergies  Lipitor; Ace inhibitors; Angiotensin receptor blockers; Bee venom; and Cymbalta  Home Medications   Prior to Admission medications   Medication Sig Start Date End Date Taking? Authorizing Provider  aspirin 81 MG tablet Take 81 mg by mouth daily.     Historical Provider, MD  clopidogrel (PLAVIX) 75 MG tablet TAKE 1 TABLET BY MOUTH DAILY WITH BREAKFAST 06/11/15   Kimber Relic, MD  diphenoxylate-atropine (LOMOTIL) 2.5-0.025 MG per tablet TAKE ONE OR TWO TABLETS BY MOUTH TWICE DAILY AS NEEDED 03/05/15   Kimber Relic, MD  escitalopram (LEXAPRO) 20 MG tablet TAKE ONE TABLET BY MOUTH ONE TIME DAILY 08/07/15   Kimber Relic, MD  HYDROcodone-acetaminophen (NORCO/VICODIN) 5-325 MG tablet Take one tablet by mouth five times daily as needed for pain 08/15/15   [name] L [name], DO  Insulin Glargine (TOUJEO SOLOSTAR) 300 UNIT/ML SOPN Inject 12 Units into the skin at bedtime.     Historical Provider, MD  insulin lispro (HUMALOG) 100 UNIT/ML KiwkPen Inject 1-6 Units into the skin daily as needed (100-199 = 1 units, 200-299 = 2 units, 300-399 = 3 units, 400-499 = 4 units, 500-599 = 5 units, 600+ = 6 units).  03/16/13   Reather Littler, MD  levETIRAcetam (KEPPRA) 750 MG tablet Take 1/2 tablet in morning. Take whole tablet at night. 07/23/15   Porfirio Mylar Dohmeier, MD  levothyroxine (SYNTHROID, LEVOTHROID) 75 MCG tablet Take 1 tablet (75 mcg total) by mouth daily. 05/10/15   Kimber Relic, MD  Melatonin 3 MG TABS Take 1  tablet by mouth at bedtime.    Historical Provider, MD  metoprolol tartrate (LOPRESSOR) 25 MG tablet One twice daily to control BP Patient taking differently: Take 25 mg by mouth 2 (two) times daily. One twice daily to control BP 05/29/15   Kimber Relic, MD  Multiple Vitamin (MULTIVITAMIN WITH MINERALS) TABS tablet Take 1 tablet by mouth daily. 02/22/15   Osvaldo Shipper, MD  ONE TOUCH ULTRA TEST test strip TEST 8 TIMES PER DAY FOR 30 DAYS 04/26/14   Reather Littler, MD  pantoprazole (PROTONIX) 40 MG tablet TAKE ONE TABLET BY MOUTH ONE TIME DAILY 05/14/15   Kimber Relic, MD  pravastatin (PRAVACHOL) 20 MG tablet Take 1 tablet (20 mg total) by mouth every evening. 07/06/14   Rollene Rotunda, MD  pregabalin (LYRICA) 50 MG capsule Take one capsule by mouth in the morning and  two capsules by mouth in the evening for pains. 08/14/15   Kimber Relic, MD  saccharomyces boulardii (FLORASTOR) 250 MG capsule Take 1 capsule (250 mg total) by mouth 2 (two) times daily. 02/22/15   Osvaldo Shipper, MD  testosterone cypionate (DEPOTESTOTERONE CYPIONATE) 200 MG/ML injection Inject 200 mg every 14 days 01/02/15   Kimber Relic, MD  vitamin B-12 (CYANOCOBALAMIN) 1000 MCG tablet Take 1,000 mcg by mouth daily.    Historical Provider, MD   BP 87/56 mmHg  Pulse 74  Resp 22  SpO2 98% Physical Exam  Constitutional: He is oriented to person, place, and time. He appears well-developed.  Elderly, frail  HENT:  Head: Normocephalic and atraumatic.  Right Ear: External ear normal.  Left Ear: External ear normal.  Oral mucous membranes are dry.  Eyes: Conjunctivae and EOM are normal. Pupils are equal, round, and reactive to light.  Neck: Normal range of motion and phonation normal. Neck supple.  Cardiovascular: Normal rate, regular rhythm and normal heart sounds.   Pulmonary/Chest: Effort normal and breath sounds normal. He exhibits no bony tenderness.  Abdominal: Soft. There is no tenderness.  Musculoskeletal: Normal range of  motion.  Neurological: He is alert and oriented to person, place, and time. No cranial nerve deficit or sensory deficit. He exhibits normal muscle tone. Coordination normal.  No dysarthria, aphasia or nystagmus  Skin: Skin is warm, dry and intact.  Psychiatric: He has a normal mood and affect. His behavior is normal.  Nursing note and vitals reviewed.   ED Course  Procedures (including critical care time)  Medications  dextrose 5 %-0.45 % sodium chloride infusion (not administered)  insulin regular (NOVOLIN R,HUMULIN R) 250 Units in sodium chloride 0.9 % 250 mL (1 Units/mL) infusion (4.4 Units/hr Intravenous New Bag/Given 09/06/15 1620)  0.9 %  sodium chloride infusion (1,000 mLs Intravenous New Bag/Given 09/06/15 1620)    Followed by  0.9 %  sodium chloride infusion (not administered)    Followed by  0.9 %  sodium chloride infusion (not administered)    Followed by  0.9 %  sodium chloride infusion (not administered)    Patient Vitals for the past 24 hrs:  BP Pulse Resp SpO2  09/06/15 1502 (!) 87/56 mmHg - - -  09/06/15 1501 (!) 87/53 mmHg 74 22 98 %    4:49 PM Reevaluation with update and discussion. After initial assessment and treatment, an updated evaluation reveals he feels better, is eating and has improved BP. Findings discussed with Pt. And son, all questions answered. Trevan Messman L   4:59 PM-Consult complete with Dr. Aline Brochure. Patient case explained and discussed. She agrees to admit patient for further evaluation and treatment. Call ended at 17:10   CRITICAL CARE Performed by: Flint Melter Total critical care time: 35 minutes minutes Critical care time was exclusive of separately billable procedures and treating other patients. Critical care was necessary to treat or prevent imminent or life-threatening deterioration. Critical care was time spent personally by me on the following activities: development of treatment plan with patient and/or surrogate as well as  nursing, discussions with consultants, evaluation of patient's response to treatment, examination of patient, obtaining history from patient or surrogate, ordering and performing treatments and interventions, ordering and review of laboratory studies, ordering and review of radiographic studies, pulse oximetry and re-evaluation of patient's condition.    Labs Review Labs Reviewed  CBG MONITORING, ED - Abnormal; Notable for the following:    Glucose-Capillary 493 (*)    All other  components within normal limits  CBC  URINALYSIS, ROUTINE W REFLEX MICROSCOPIC (NOT AT [facility])  COMPREHENSIVE METABOLIC PANEL  I-STAT CG4 LACTIC ACID, ED  I-STAT TROPOININ, ED    Imaging Review No results found. I have personally reviewed and evaluated these images and lab results as part of my medical decision-making.   EKG Interpretation None      MDM   Final diagnoses:  Diabetic ketoacidosis without coma associated with type 1 diabetes mellitus (HCC)  Dehydration  Anemia, unspecified anemia type  Insomnia    DKA, secondary to medication noncompliance. Pt is markedly dehydrated with hypotension. He also has incidental anemia, and no known source of blood loss. No overt clinical signs of infection. Suspect hypotension is causing elevated lactate. Pt is tolerating oral nutrition in the ED.  Nursing Notes Reviewed/ Care Coordinated Applicable Imaging Reviewed Interpretation of Laboratory Data incorporated into ED treatment   Plan:  Admit    Mancel Bale, MD 09/07/15 [phone]

## 2015-09-07 ENCOUNTER — Other Ambulatory Visit: Payer: Self-pay | Admitting: Cardiology

## 2015-09-07 LAB — BASIC METABOLIC PANEL
ANION GAP: 10 (ref 5–15)
ANION GAP: 10 (ref 5–15)
Anion gap: 8 (ref 5–15)
BUN: 27 mg/dL — ABNORMAL HIGH (ref 6–20)
BUN: 25 mg/dL — ABNORMAL HIGH (ref 6–20)
BUN: 21 mg/dL — ABNORMAL HIGH (ref 6–20)
CALCIUM: 8.4 mg/dL — AB (ref 8.9–10.3)
CALCIUM: 8.5 mg/dL — AB (ref 8.9–10.3)
CALCIUM: 8.9 mg/dL (ref 8.9–10.3)
CHLORIDE: 103 mmol/L (ref 101–111)
CHLORIDE: 107 mmol/L (ref 101–111)
CO2: 23 mmol/L (ref 22–32)
CO2: 24 mmol/L (ref 22–32)
CO2: 27 mmol/L (ref 22–32)
CREATININE: 1.13 mg/dL (ref 0.61–1.24)
Chloride: 105 mmol/L (ref 101–111)
Creatinine, Ser: 1.26 mg/dL — ABNORMAL HIGH (ref 0.61–1.24)
Creatinine, Ser: 1.18 mg/dL (ref 0.61–1.24)
GFR calc Af Amer: >60 mL/min (ref >60)
GFR calc non Af Amer: 55 mL/min — ABNORMAL LOW (ref >60)
GFR calc non Af Amer: 60 mL/min — ABNORMAL LOW (ref >60)
GFR calc non Af Amer: >60 mL/min (ref >60)
GLUCOSE: 150 mg/dL — AB (ref 65–99)
Glucose, Bld: 147 mg/dL — ABNORMAL HIGH (ref 65–99)
Glucose, Bld: 167 mg/dL — ABNORMAL HIGH (ref 65–99)
POTASSIUM: 4.1 mmol/L (ref 3.5–5.1)
Potassium: 3.9 mmol/L (ref 3.5–5.1)
Potassium: 3.9 mmol/L (ref 3.5–5.1)
Sodium: 137 mmol/L (ref 135–145)
Sodium: 140 mmol/L (ref 135–145)
Sodium: 140 mmol/L (ref 135–145)

## 2015-09-07 LAB — GLUCOSE, CAPILLARY
GLUCOSE-CAPILLARY: 322 mg/dL — AB (ref 65–99)
GLUCOSE-CAPILLARY: 278 mg/dL — AB (ref 65–99)
GLUCOSE-CAPILLARY: 144 mg/dL — AB (ref 65–99)
GLUCOSE-CAPILLARY: 180 mg/dL — AB (ref 65–99)
GLUCOSE-CAPILLARY: 200 mg/dL — AB (ref 65–99)
GLUCOSE-CAPILLARY: 183 mg/dL — AB (ref 65–99)
GLUCOSE-CAPILLARY: 138 mg/dL — AB (ref 65–99)
GLUCOSE-CAPILLARY: 143 mg/dL — AB (ref 65–99)
GLUCOSE-CAPILLARY: 134 mg/dL — AB (ref 65–99)
GLUCOSE-CAPILLARY: 242 mg/dL — AB (ref 65–99)
GLUCOSE-CAPILLARY: 196 mg/dL — AB (ref 65–99)
Glucose-Capillary: 128 mg/dL — ABNORMAL HIGH (ref 65–99)
Glucose-Capillary: 149 mg/dL — ABNORMAL HIGH (ref 65–99)
Glucose-Capillary: 155 mg/dL — ABNORMAL HIGH (ref 65–99)
Glucose-Capillary: 238 mg/dL — ABNORMAL HIGH (ref 65–99)

## 2015-09-07 LAB — LACTIC ACID, PLASMA
Lactic Acid, Venous: 1.2 mmol/L (ref 0.5–2.0)
Lactic Acid, Venous: 2.2 mmol/L (ref 0.5–2.0)

## 2015-09-07 MED ORDER — SODIUM CHLORIDE 0.9 % IV SOLN
1000.0000 mL | INTRAVENOUS | Status: DC
  Administered 2015-09-07 – 2015-09-08 (×2): 1000 mL via INTRAVENOUS

## 2015-09-07 MED ORDER — GLUCERNA SHAKE PO LIQD
237.0000 mL | Freq: Three times a day (TID) | ORAL | Status: DC
  Administered 2015-09-07: 237 mL via ORAL

## 2015-09-07 MED ORDER — INSULIN GLARGINE 100 UNIT/ML ~~LOC~~ SOLN
10.0000 [IU] | Freq: Once | SUBCUTANEOUS | Status: AC
  Administered 2015-09-07: 10 [IU] via SUBCUTANEOUS
  Filled 2015-09-07: qty 0.1

## 2015-09-07 MED ORDER — INSULIN ASPART 100 UNIT/ML ~~LOC~~ SOLN
0.0000 [IU] | Freq: Three times a day (TID) | SUBCUTANEOUS | Status: DC
  Administered 2015-09-07 (×2): 5 [IU] via SUBCUTANEOUS
  Administered 2015-09-08: 8 [IU] via SUBCUTANEOUS

## 2015-09-07 MED ORDER — INSULIN GLARGINE 100 UNIT/ML ~~LOC~~ SOLN
10.0000 [IU] | Freq: Every day | SUBCUTANEOUS | Status: DC
  Filled 2015-09-07: qty 0.1

## 2015-09-07 MED ORDER — HYDROCODONE-ACETAMINOPHEN 5-325 MG PO TABS
1.0000 | ORAL_TABLET | ORAL | Status: DC | PRN
  Administered 2015-09-07 (×2): 1 via ORAL
  Filled 2015-09-07 (×2): qty 1

## 2015-09-07 MED ORDER — MAGNESIUM SULFATE 2 GM/50ML IV SOLN
2.0000 g | Freq: Once | INTRAVENOUS | Status: AC
  Administered 2015-09-07: 2 g via INTRAVENOUS
  Filled 2015-09-07: qty 50

## 2015-09-07 MED ORDER — METOPROLOL TARTRATE 25 MG PO TABS
25.0000 mg | ORAL_TABLET | Freq: Two times a day (BID) | ORAL | Status: DC
  Administered 2015-09-07 – 2015-09-08 (×3): 25 mg via ORAL
  Filled 2015-09-07 (×3): qty 1

## 2015-09-07 MED ORDER — LIVING WELL WITH DIABETES BOOK
Freq: Once | Status: AC
  Administered 2015-09-07: 18:00:00
  Filled 2015-09-07: qty 1

## 2015-09-07 NOTE — Progress Notes (Signed)
[facility]                                                                              Patient Demographics  Benjamin Hodge, is a 74 y.o. male, DOB - 1941/07/17, ASU:[id number]  Admit date - 09/06/2015   Admitting Physician Edsel Petrin, DO  Outpatient Primary MD for the patient is GREEN, Lenon Curt, MD  LOS - 1   Chief Complaint  Patient presents with  . Weakness      HPI on 09/06/2015 Benjamin Hodge is a 74 y.o. male  With a history of diabetes mellitus, coronary artery disease, hypertension, presented to the emergency department for weakness. Patient states over the last several days he's been feeling weak and has not been using or taking most of his medications including his insulin. She is a poor historian. He stated he had not been taking his medications for proximally 20 days and then changed his story to 2-3 days. Patient states he's had decreased appetite. He is afraid of low blood sugars, therefore he has not taken his insulin. Patient also complains of having trouble walking. He denies any recent illness or travel. Does admit to having a dry cough for the past week. In the emergency department, patient was found to have a blood sugar of 651 with an anion gap 25, creatinine 2.37. Patient was started on insulin/glucose stabilizer. [facility] called for admission.  Assessment & Plan   Diabetic ketoacidosis/Uncontrolled diabetes mellitus, type 1 -Initially placed on IV fluids, glucose stabilizer, check BMP every 4 hours, monitor CBGs -Last hemoglobin A1c 10.6 on 06/28/2015 -Diabetes coordinator consulted -Likely secondary to noncompliance with insulin versus possible underlying infection as patient states he has not been feeling well -CXR and UA unremakable -Will transition to lantus and ISS scale today.  Anion gap closed from 25 to 10.    Acute on chronic kidney disease, stage III -Baseline creatinine 1.6, was 2.37 at admission -Currently Cr 1.18 -Likely secondary  to DKA and dehydration -Continue treatment and plan as above -Will monitor BMP  Lactic acidosis/anion gap metabolic acidosis -Secondary to the above, Repeat lactic acid pending -Metabolic acidosis improving  Hypotension -Resolved, Likely secondary to dehydration  Hypertension -Will restart metoprolol   Dehydration/poor oral intake -Patient states he's been feeling unwell for the last several days -Treatment plan as above  Generalized weakness -Likely secondary to DKA -PT and OT pending -TSH 0.878  Hypothyroidism -Continue Synthroid -TSH 0.878  GERD -Continue PPI  Coronary artery disease -Currently chest pain-free, continue Plavix, aspirin, statin -Metoprolol held due to hypotension- will restart today  Normocytic anemia -Baseline hemoglobin between 9 and 10 -Currently 8.9 -Continue to monitor CBC  Hypomagnesemia -Mag 1.6, will replace and continue to monitor  Seizure disorder -Continue Keppra  Code Status: Full  Family Communication: None at bedside  Disposition Plan: Admitted. Continue treatment for DKA, PT and OT consulted.  If stable, will transfer to medical floor later today  Time Spent in minutes   30 minutes  Procedures  None  Consults   None  DVT Prophylaxis  Lovenox  Lab Results  Component Value Date   PLT 218 09/06/2015    Medications  Scheduled Meds: .  sodium chloride  1,000 mL Intravenous Once  . aspirin  81 mg Oral Daily  . clopidogrel  75 mg Oral Q breakfast  . enoxaparin (LOVENOX) injection  30 mg Subcutaneous Q24H  . escitalopram  20 mg Oral Daily  . feeding supplement (GLUCERNA SHAKE)  237 mL Oral TID BM  . insulin aspart  0-15 Units Subcutaneous TID WC  . insulin regular  0-10 Units Intravenous TID WC  . levETIRAcetam  380 mg Oral q morning - 10a  . levETIRAcetam  750 mg Oral QHS,MR X 1  . levothyroxine  75 mcg Oral QAC breakfast  . multivitamin with minerals  1 tablet Oral Daily  . pantoprazole  40 mg Oral Daily  .  pravastatin  20 mg Oral QPM  . pregabalin  50 mg Oral Daily   Continuous Infusions: . sodium chloride    . sodium chloride    . sodium chloride Stopped (09/06/15 2318)  . dextrose 5 % and 0.45% NaCl    . dextrose 5 % and 0.45% NaCl    . dextrose 5 % and 0.45% NaCl 75 mL/hr at 09/06/15 2318  . insulin (NOVOLIN-R) infusion 5.4 Units/hr (09/07/15 0949)   PRN Meds:.dextrose  Antibiotics    Anti-infectives    None      Subjective:   Benjamin Hodge seen and examined today.  Patient states he is feeling much better today. Denies any nausea or vomiting, chest pain, shortness of breath. Inquires about going home. Does continue to feel some weakness.  Objective:   Filed Vitals:   09/06/15 2030 09/07/15 0131 09/07/15 0401 09/07/15 0826  BP: 152/62 113/94 127/53 158/68  Pulse: 70 68 65 64  Temp:  97.9 F (36.6 C) 97.6 F (36.4 C) 97 F (36.1 C)  TempSrc:  Oral Oral Axillary  Resp: Height:  (1.676 m)     Weight: 57.3 kg (126 lb 5.2 oz)     SpO2: 100% 100% 99% 100%    Wt Readings from Last 3 Encounters:  09/06/15 57.3 kg (126 lb 5.2 oz)  07/16/15 57.607 kg (127 lb)  07/02/15 56.065 kg (123 lb 9.6 oz)     Intake/Output Summary (Last 24 hours) at 09/07/15 1006 Last data filed at 09/07/15 0600  Gross per 24 hour  Intake 825.87 ml  Output   1200 ml  Net -374.13 ml    Exam  General: Well developed, malnourished, NAD, appears stated age  HEENT: NCAT, mucous membranes moist  Cardiovascular: S1 S2 auscultated, RRR, no mumurs  Respiratory: Clear to auscultation bilaterally   Abdomen: Soft, nontender, nondistended, + bowel sounds  Extremities: warm dry without cyanosis clubbing or edema  Neuro: AAOx3, nonfocal  Psych: Normal affect and demeanor  Data Review   Micro Results Recent Results (from the past 240 hour(s))  MRSA PCR Screening     Status: None   Collection Time: 09/06/15 10:06 PM  Result Value Ref Range Status   MRSA by PCR  NEGATIVE NEGATIVE Final    Comment:        The GeneXpert MRSA Assay (FDA approved for NASAL specimens only), is one component of a comprehensive MRSA colonization surveillance program. It is not intended to diagnose MRSA infection nor to guide or monitor treatment for MRSA infections.     Radiology Reports Dg Chest Port 1 View  09/07/2015  CLINICAL DATA:  Acute onset of cough.  Initial encounter. EXAM: PORTABLE CHEST 1 VIEW COMPARISON:  Chest radiograph performed 03/10/2014 FINDINGS:  The lungs are well-aerated. There is mild elevation of the left hemidiaphragm. Minimal bilateral atelectasis is noted. There is no evidence of pleural effusion or pneumothorax. The cardiomediastinal silhouette is within normal limits. No acute osseous abnormalities are seen. IMPRESSION: Mild elevation of the left hemidiaphragm. Minimal bilateral atelectasis noted. Electronically Signed   By: Roanna Raider M.D.   On: 09/07/2015 00:40    CBC  Recent Labs Lab 09/06/15 1504 09/06/15 2151  WBC 6.8 6.0  HGB 9.4* 8.9*  HCT 28.8* 25.9*  PLT 244 218  MCV 97.3 91.8  MCH 31.8 31.6  MCHC 32.6 34.4  RDW 12.7 12.4    Chemistries   Recent Labs Lab 09/06/15 1504 09/06/15 2151 09/07/15 0021 09/07/15 0445  NA 129* 138 137 140  K 4.2 3.6 4.1 3.9  CL 89* 102 103 107  CO2 15* GLUCOSE 651* 248* 147* 167*  BUN 37* 29* 27* 25*  CREATININE 2.37* 1.41* 1.26* 1.18  CALCIUM 9.0 8.4* 8.4* 8.5*  MG  --  1.6*  --   --   AST 27  --   --   --   ALT 27  --   --   --   ALKPHOS 92  --   --   --   BILITOT 1.6*  --   --   --    ------------------------------------------------------------------------------------------------------------------ estimated creatinine clearance is 45.9 mL/min (by C-G formula based on Cr of 1.18). ------------------------------------------------------------------------------------------------------------------ No results for input(s): HGBA1C in the last 72  hours. ------------------------------------------------------------------------------------------------------------------ No results for input(s): CHOL, HDL, LDLCALC, TRIG, CHOLHDL, LDLDIRECT in the last 72 hours. ------------------------------------------------------------------------------------------------------------------  Recent Labs  09/06/15 2151  TSH 0.878   ------------------------------------------------------------------------------------------------------------------ No results for input(s): VITAMINB12, FOLATE, FERRITIN, TIBC, IRON, RETICCTPCT in the last 72 hours.  Coagulation profile No results for input(s): INR, PROTIME in the last 168 hours.  No results for input(s): DDIMER in the last 72 hours.  Cardiac Enzymes No results for input(s): CKMB, TROPONINI, MYOGLOBIN in the last 168 hours.  Invalid input(s): CK ------------------------------------------------------------------------------------------------------------------ Invalid input(s): POCBNP    Benjamin Hodge D.O. on 09/07/2015 at 10:06 AM  Between 7am to 7pm - Pager - [phone]  After 7pm go to [email or web address] - password [email or web address]  And look for the night coverage person covering for me after hours  [facility] Office  [phone]

## 2015-09-07 NOTE — Telephone Encounter (Signed)
Rx(s) sent to pharmacy electronically.  

## 2015-09-07 NOTE — Progress Notes (Signed)
CRITICAL VALUE ALERT  Critical value received:  Lactic Acid, Venous (2.2)  Date of notification:  09/07/2015  Time of notification:  1322  Critical value read back:Yes.    Nurse who received alert:  Lovie Macadamia RN  MD notified (1st page):  Dr Catha Gosselin  Time of first page:  1240  MD notified (2nd page):  Time of second page:  Responding MD:  Dr Catha Gosselin  Time MD responded:  1250

## 2015-09-07 NOTE — Progress Notes (Signed)
Initial Nutrition Assessment  DOCUMENTATION CODES:   Not applicable  INTERVENTION:   Glucerna Shake po TID, each supplement provides 220 kcal and 10 grams of protein  NUTRITION DIAGNOSIS:   Increased nutrient needs related to chronic illness as evidenced by estimated needs  GOAL:   Patient will meet greater than or equal to 90% of their needs  MONITOR:   PO intake, Supplement acceptance, Labs, Weight trends, I & O's  REASON FOR ASSESSMENT:   Consult Assessment of nutrition requirement/status  ASSESSMENT:   74 y.o. Male with PMH of diabetes mellitus, coronary artery disease, hypertension, presented to the emergency department for weakness. Patient states over the last several days he's been feeling weak and has not been using or taking most of his medications including his insulin. She is a poor historian. He stated he had not been taking his medications for proximally 20 days and then changed his story to 2-3 days. Patient states he's had decreased appetite. He is afraid of low blood sugars, therefore he has not taken his insulin. Patient also complains of having trouble walking. He denies any recent illness or travel. Does admit to having a dry cough for the past week. In the emergency department, patient was found to have a blood sugar of 651 with an anion gap 25, creatinine 2.37. Patient was started on insulin/glucose stabilizer. TRH called for admission.  Patient sleeping upon RD visit.  Not answering interview questions.  Per H&P pt with decreased appetite.  No % PO intake available per flowsheet records.  Per wt readings below, pt's weight has been stable.  Would benefit form oral nutrition supplements.  RD to order.  RD unable to complete Nutrition Focused Physical Exam at this time.  Diet Order:  Diet Carb Modified Fluid consistency:: Thin; Room service appropriate?: Yes  Skin:  Reviewed, no issues  Last BM:  12/31  Height:   Ht Readings from Last 1 Encounters:   09/06/15 5\' 6"  (1.676 m)    Weight:   Wt Readings from Last 1 Encounters:  09/06/15 126 lb 5.2 oz (57.3 kg)    Wt Readings from Last 20 Encounters:  09/06/15 126 lb 5.2 oz (57.3 kg)  07/16/15 127 lb (57.607 kg)  07/02/15 123 lb 9.6 oz (56.065 kg)  06/06/15 125 lb 14.4 oz (57.108 kg)  05/29/15 127 lb 3.2 oz (57.698 kg)  05/27/15 127 lb (57.607 kg)  05/04/15 129 lb 9.6 oz (58.786 kg)  04/10/15 124 lb (56.246 kg)  03/21/15 124 lb 8 oz (56.473 kg)  02/22/15 127 lb 6.8 oz (57.8 kg)  01/02/15 125 lb 9.6 oz (56.972 kg)  11/27/14 130 lb (58.968 kg)  10/08/14 125 lb (56.7 kg)  09/14/14 123 lb (55.792 kg)  08/29/14 122 lb 12.8 oz (55.702 kg)  07/12/14 123 lb 9.6 oz (56.065 kg)  07/10/14 120 lb (54.432 kg)  07/06/14 121 lb (54.885 kg)  06/14/14 122 lb 12.8 oz (55.702 kg)  06/06/14 122 lb (55.339 kg)    Ideal Body Weight:  64.5 kg  BMI:  Body mass index is 20.4 kg/(m^2).  Estimated Nutritional Needs:   Kcal:  1700-1900  Protein:  80-90 gm  Fluid:  1.7-1.9 L  EDUCATION NEEDS:   No education needs identified at this time  Maureen Chatters, RD, LDN Pager #: [phone] After-Hours Pager #: [phone]

## 2015-09-08 DIAGNOSIS — E038 Other specified hypothyroidism: Secondary | ICD-10-CM

## 2015-09-08 DIAGNOSIS — E1010 Type 1 diabetes mellitus with ketoacidosis without coma: Principal | ICD-10-CM

## 2015-09-08 DIAGNOSIS — I2510 Atherosclerotic heart disease of native coronary artery without angina pectoris: Secondary | ICD-10-CM

## 2015-09-08 DIAGNOSIS — R569 Unspecified convulsions: Secondary | ICD-10-CM

## 2015-09-08 LAB — URINE CULTURE: Culture: NO GROWTH

## 2015-09-08 LAB — BASIC METABOLIC PANEL
Anion gap: 9 (ref 5–15)
BUN: 25 mg/dL — AB (ref 6–20)
CO2: 24 mmol/L (ref 22–32)
CREATININE: 1.3 mg/dL — AB (ref 0.61–1.24)
Calcium: 8.6 mg/dL — ABNORMAL LOW (ref 8.9–10.3)
Chloride: 103 mmol/L (ref 101–111)
GFR calc Af Amer: >60 mL/min (ref >60)
GFR, EST NON AFRICAN AMERICAN: 53 mL/min — AB (ref >60)
Glucose, Bld: 318 mg/dL — ABNORMAL HIGH (ref 65–99)
Potassium: 4.5 mmol/L (ref 3.5–5.1)
SODIUM: 136 mmol/L (ref 135–145)

## 2015-09-08 LAB — CBC
HCT: 27 % — ABNORMAL LOW (ref 39.0–52.0)
Hemoglobin: 9 g/dL — ABNORMAL LOW (ref 13.0–17.0)
MCH: 31.6 pg (ref 26.0–34.0)
MCHC: 33.3 g/dL (ref 30.0–36.0)
MCV: 94.7 fL (ref 78.0–100.0)
PLATELETS: 197 10*3/uL (ref 150–400)
RBC: 2.85 MIL/uL — ABNORMAL LOW (ref 4.22–5.81)
RDW: 13.2 % (ref 11.5–15.5)
WBC: 5.5 10*3/uL (ref 4.0–10.5)

## 2015-09-08 LAB — MAGNESIUM: MAGNESIUM: 2 mg/dL (ref 1.7–2.4)

## 2015-09-08 LAB — GLUCOSE, CAPILLARY: Glucose-Capillary: 231 mg/dL — ABNORMAL HIGH (ref 65–99)

## 2015-09-08 MED ORDER — INSULIN GLARGINE 100 UNIT/ML ~~LOC~~ SOLN
15.0000 [IU] | Freq: Every day | SUBCUTANEOUS | Status: DC
  Administered 2015-09-08: 15 [IU] via SUBCUTANEOUS
  Filled 2015-09-08: qty 0.15

## 2015-09-08 MED ORDER — GLUCERNA SHAKE PO LIQD: 237.0000 mL | Freq: Three times a day (TID) | ORAL | Status: DC

## 2015-09-08 MED ORDER — INSULIN ASPART 100 UNIT/ML ~~LOC~~ SOLN: 4.0000 [IU] | Freq: Three times a day (TID) | SUBCUTANEOUS | Status: DC

## 2015-09-08 MED ORDER — INSULIN GLARGINE 300 UNIT/ML ~~LOC~~ SOPN
14.0000 [IU] | PEN_INJECTOR | Freq: Every day | SUBCUTANEOUS | Status: DC
Start: 2015-09-08 — End: 2015-10-03

## 2015-09-08 MED ORDER — PREGABALIN 25 MG PO CAPS
50.0000 mg | ORAL_CAPSULE | Freq: Every day | ORAL | Status: DC
  Administered 2015-09-08: 50 mg via ORAL
  Filled 2015-09-08 (×2): qty 2

## 2015-09-08 NOTE — Care Management Note (Signed)
Case Management Note  Patient Details  Name: LAVI NITKA MRN: [id number] Date of Birth: 03-Sep-1941  Subjective/Objective:                    Action/Plan: Anticipate discharge home today with HHPT RN provided by [facility]. No further CM needs but will be available should additional discharge needs arise.   Expected Discharge Date:                  Expected Discharge Plan:  Home w Home Health Services  In-House Referral:     Discharge planning Services  CM Consult  Post Acute Care Choice:    Choice offered to:  Patient  DME Arranged:    DME Agency:     HH Arranged:  RN, PT HH Agency:  [facility]  Status of Service:  Completed, signed off  Medicare Important Message Given:    Date Medicare IM Given:    Medicare IM give by:    Date Additional Medicare IM Given:    Additional Medicare Important Message give by:     If discussed at Long Length of Stay Meetings, dates discussed:    Additional Comments:  Yvone Neu, RN 09/08/2015, 11:27 AM

## 2015-09-08 NOTE — Care Management Note (Signed)
Case Management Note  Patient Details  Name: Benjamin Hodge MRN: [id number] Date of Birth: 1941-05-21  Subjective/Objective:  75 y.o. M  with a history of diabetes mellitus, coronary artery disease, hypertension, presented to the emergency department for weakness on 09/06/2015.                 Action/Plan:   Anticipate discharge home today with HHRN and HHPT provided by [facility] confirmed by [place]. . No further CM needs but will be available should additional discharge needs arise.   Expected Discharge Date:                  Expected Discharge Plan:  Home w Home Health Services  In-House Referral:     Discharge planning Services  CM Consult  Post Acute Care Choice:    Choice offered to:  Patient  DME Arranged:    DME Agency:     HH Arranged:  RN, PT HH Agency:  [facility]  Status of Service:  Completed, signed off  Medicare Important Message Given:    Date Medicare IM Given:    Medicare IM give by:    Date Additional Medicare IM Given:    Additional Medicare Important Message give by:     If discussed at Long Length of Stay Meetings, dates discussed:    Additional Comments:  Yvone Neu, RN 09/08/2015, 11:54 AM

## 2015-09-08 NOTE — Discharge Summary (Signed)
Physician Discharge Summary  Benjamin Hodge GNF:[id number] DOB: [birth year] DOA: 09/06/2015  PCP: Kimber Relic, MD  Admit date: 09/06/2015 Discharge date: 09/08/2015  Recommendations for Outpatient Follow-up:  1. Pt will need to follow up with PCP in 2 weeks post discharge 2. Please obtain BMP on 09/11/15  Discharge Diagnoses:  Diabetic ketoacidosis/Uncontrolled diabetes mellitus, type 1 -Initially placed on IV fluids, glucose stabilizer, check BMP every 4 hours, monitor CBGs -Last hemoglobin A1c 10.6 on 06/28/2015 -Diabetes coordinator consulted -Likely secondary to noncompliance -Long discussion with the patient's son states the patient is in denial and is noncompliant with his medications partly due to his fear of hypoglycemia -CXR and UA unremakable -Will transition to lantus and ISS scale today. Anion gap closed -home with Toujeo 14 units daily  Acute on chronic kidney disease, stage III -Baseline creatinine 1.0-1.3, was 2.37 at admission -Serum creatinine 1.30 on the day of discharge -Likely secondary to DKA and dehydration -Continue treatment and plan as above -Will monitor BMP  Lactic acidosis/anion gap metabolic acidosis -Secondary to the above, Repeat lactic acid pending -Metabolic acidosis improving  Hypotension -Resolved, Likely secondary to dehydration -Improved  Opioid dependence/overuse -Patient's son states that the patient has been overusing his hydrocodone -will not give any Rx   Hypertension -Will restart metoprolol    Dehydration/poor oral intake -Patient states he's been feeling unwell for the last several days -Treatment plan as above  Generalized weakness -Likely secondary to DKA -set up home health RN and PT -TSH 0.878 -pt states he absolutely refuses any type of SNF and pt has refused his son's offer to move in with his son for help--pt adamantly wants to stay by himself  Hypothyroidism -Continue Synthroid -TSH  0.878  GERD -Continue PPI  Coronary artery disease -Currently chest pain-free, continue Plavix, aspirin, statin -Metoprolol held due to hypotension- will restart today  Normocytic anemia -Baseline hemoglobin between 9 and 10 -Currently 8.9 -Continue to monitor CBC  Hypomagnesemia -Mag 1.6-->2.0 -Repleted  Seizure disorder -Continue Keppra  Discharge Condition: stable  Disposition: home  Diet:carb modified Wt Readings from Last 3 Encounters:  09/06/15 57.3 kg (126 lb 5.2 oz)  07/16/15 57.607 kg (127 lb)  07/02/15 56.065 kg (123 lb 9.6 oz)    History of present illness:  75 y.o. male  With a history of diabetes mellitus, coronary artery disease, hypertension, presented to the emergency department for weakness. Patient states over the last several days he's been feeling weak and has not been using or taking most of his medications including his insulin. She is a poor historian. He stated he had not been taking his medications for proximally 20 days and then changed his story to 2-3 days. Patient states he's had decreased appetite. He is afraid of low blood sugars, therefore he has not taken his insulin. Patient also complains of having trouble walking. He denies any recent illness or travel. Does admit to having a dry cough for the past week. In the emergency department, patient was found to have a blood sugar of 651 with an anion gap 25, creatinine 2.37. Patient was started on insulin/glucose stabilizer. TR[facility] called for admission. The patient was started on intravenous fluids and transitioned to subcutaneous insulin. The patient improved clinically. Further history provided by the patient's son reveals that the patient has been noncompliant with his insulin and has been over using his hydrocodone. The patient absolutely refuses to move in with his son despite his son's offer to help. In fact, the patient refuses any  kind of skilled nursing facility. Home health RN and PT will be set  up.    Discharge Exam: Filed Vitals:   09/08/15 0700 09/08/15 0852  BP: 154/65 156/97  Pulse: 58 59  Temp: 98.7 F (37.1 C) 97.7 F (36.5 C)  Resp: 11 15   Filed Vitals:   09/08/15 0023 09/08/15 0426 09/08/15 0700 09/08/15 0852  BP: 155/61 165/68 154/65 156/97  Pulse: 58 62 58 59  Temp: 97.9 F (36.6 C) 98.3 F (36.8 C) 98.7 F (37.1 C) 97.7 F (36.5 C)  TempSrc: Oral Oral Oral Oral  Resp: Height:      Weight:      SpO2: 100% 98% 98% 97%   General: A&O x 3, NAD, pleasant, cooperative Cardiovascular: RRR, no rub, no gallop, no S3 Respiratory: CTAB, no wheeze, no rhonchi Abdomen:soft, nontender, nondistended, positive bowel sounds Extremities: No edema, No lymphangitis, no petechiae  Discharge Instructions      Discharge Instructions    Diet - low sodium heart healthy    Complete by:  As directed      Increase activity slowly    Complete by:  As directed             Medication List    STOP taking these medications        diphenoxylate-atropine 2.5-0.025 MG tablet  Commonly known as:  LOMOTIL     HYDROcodone-acetaminophen 5-325 MG tablet  Commonly known as:  NORCO/VICODIN      TAKE these medications        aspirin 81 MG tablet  Take 81 mg by mouth daily.     clopidogrel 75 MG tablet  Commonly known as:  PLAVIX  TAKE 1 TABLET BY MOUTH DAILY WITH BREAKFAST     escitalopram 20 MG tablet  Commonly known as:  LEXAPRO  TAKE ONE TABLET BY MOUTH ONE TIME DAILY     feeding supplement (GLUCERNA SHAKE) Liqd  Take 237 mLs by mouth 3 (three) times daily between meals.     Insulin Glargine 300 UNIT/ML Sopn  Commonly known as:  TOUJEO SOLOSTAR  Inject 14 Units into the skin at bedtime.     insulin lispro 100 UNIT/ML KiwkPen  Commonly known as:  HUMALOG  Inject 1-6 Units into the skin daily as needed (100-199 = 1 units, 200-299 = 2 units, 300-399 = 3 units, 400-499 = 4 units, 500-599 = 5 units, 600+ = 6 units).     levETIRAcetam 750 MG  tablet  Commonly known as:  KEPPRA  Take 1/2 tablet in morning. Take whole tablet at night.     levothyroxine 75 MCG tablet  Commonly known as:  SYNTHROID, LEVOTHROID  Take 1 tablet (75 mcg total) by mouth daily.     metoprolol tartrate 25 MG tablet  Commonly known as:  LOPRESSOR  One twice daily to control BP     multivitamin with minerals Tabs tablet  Take 1 tablet by mouth daily.     ONE TOUCH ULTRA TEST test strip  Generic drug:  glucose blood  TEST 8 TIMES PER DAY FOR 30 DAYS     pantoprazole 40 MG tablet  Commonly known as:  PROTONIX  TAKE ONE TABLET BY MOUTH ONE TIME DAILY     pravastatin 20 MG tablet  Commonly known as:  PRAVACHOL  Take 1 tablet (20 mg total) by mouth every evening. NEEDS APPOINTMENT FOR FUTURE REFILLS OR 90-DAY SUPPLY     pregabalin 50 MG capsule  Commonly known as:  LYRICA  Take one capsule by mouth in the morning and two capsules by mouth in the evening for pains.     saccharomyces boulardii 250 MG capsule  Commonly known as:  FLORASTOR  Take 1 capsule (250 mg total) by mouth 2 (two) times daily.     vitamin B-12 1000 MCG tablet  Commonly known as:  CYANOCOBALAMIN  Take 1,000 mcg by mouth daily.         The results of significant diagnostics from this hospitalization (including imaging, microbiology, ancillary and laboratory) are listed below for reference.    Significant Diagnostic Studies: Dg Chest Port 1 View  09/07/2015  CLINICAL DATA:  Acute onset of cough.  Initial encounter. EXAM: PORTABLE CHEST 1 VIEW COMPARISON:  Chest radiograph performed 03/10/2014 FINDINGS: The lungs are well-aerated. There is mild elevation of the left hemidiaphragm. Minimal bilateral atelectasis is noted. There is no evidence of pleural effusion or pneumothorax. The cardiomediastinal silhouette is within normal limits. No acute osseous abnormalities are seen. IMPRESSION: Mild elevation of the left hemidiaphragm. Minimal bilateral atelectasis noted.  Electronically Signed   By: Roanna Raider M.D.   On: 09/07/2015 00:40     Microbiology: Recent Results (from the past 240 hour(s))  MRSA PCR Screening     Status: None   Collection Time: 09/06/15 10:06 PM  Result Value Ref Range Status   MRSA by PCR NEGATIVE NEGATIVE Final    Comment:        The GeneXpert MRSA Assay (FDA approved for NASAL specimens only), is one component of a comprehensive MRSA colonization surveillance program. It is not intended to diagnose MRSA infection nor to guide or monitor treatment for MRSA infections.      Labs: Basic Metabolic Panel:  Recent Labs Lab 09/06/15 2151 09/07/15 0021 09/07/15 0445 09/07/15 0930 09/08/15 0449  NA 138 137 140 140 136  K 3.6 4.1 3.9 3.9 4.5  CL 102 103 107 105 103  CO2 26 24 23 27 24   GLUCOSE 248* 147* 167* 150* 318*  BUN 29* 27* 25* 21* 25*  CREATININE 1.41* 1.26* 1.18 1.13 1.30*  CALCIUM 8.4* 8.4* 8.5* 8.9 8.6*  MG 1.6*  --   --   --  2.0   Liver Function Tests:  Recent Labs Lab 09/06/15 1504  AST 27  ALT 27  ALKPHOS 92  BILITOT 1.6*  PROT 6.3*  ALBUMIN 3.3*   No results for input(s): LIPASE, AMYLASE in the last 168 hours. No results for input(s): AMMONIA in the last 168 hours. CBC:  Recent Labs Lab 09/06/15 1504 09/06/15 2151 09/08/15 0449  WBC 6.8 6.0 5.5  HGB 9.4* 8.9* 9.0*  HCT 28.8* 25.9* 27.0*  MCV 97.3 91.8 94.7  PLT 244 218 197   Cardiac Enzymes: No results for input(s): CKTOTAL, CKMB, CKMBINDEX, TROPONINI in the last 168 hours. BNP: Invalid input(s): POCBNP CBG:  Recent Labs Lab 09/07/15 0947 09/07/15 1204 09/07/15 1732 09/07/15 2116 09/08/15 0850  GLUCAP 128* 238* 242* 196* 231*    Time coordinating discharge:  Greater than 30 minutes  Signed:  Cordai Rodrigue, DO [facility] Pager: [phone] 09/08/2015, 10:51 AM

## 2015-09-09 LAB — LEVETIRACETAM LEVEL: Levetiracetam Lvl: 4.1 ug/mL — ABNORMAL LOW (ref 10.0–40.0)

## 2015-09-12 ENCOUNTER — Encounter (HOSPITAL_COMMUNITY): Payer: Medicare Other

## 2015-09-12 ENCOUNTER — Telehealth: Payer: Self-pay | Admitting: *Deleted

## 2015-09-12 ENCOUNTER — Ambulatory Visit: Payer: Medicare Other | Admitting: Vascular Surgery

## 2015-09-12 NOTE — Telephone Encounter (Signed)
Diane with [facility] called and stated that she went to see patient yesterday and there are a lot of issues going on. Son wants patient to come live with him but patient does not want to go there, patient is ok with someone coming to the home. Patient does not want to give up his job and Homecare explained to him that he has to be homebound in order to get Home Care and nurse tried to talk with him about at least taking some time off. Patient has already had 3 falls since being back from the hospital. Patient sleeps very long hours and patient is still in the bed at 2pm. Blood sugars have been running low. Home Health concerned about patient safety. They will be following up with patient today and they wanted to make you aware of whats going on. She will call back with an update after seeing him today.

## 2015-09-13 ENCOUNTER — Inpatient Hospital Stay (HOSPITAL_COMMUNITY)
Admission: EM | Admit: 2015-09-13 | Discharge: 2015-09-15 | DRG: 639 | Disposition: A | Discharge status: Home / Self Care | Payer: Medicare Other | Attending: Internal Medicine | Admitting: Internal Medicine

## 2015-09-13 ENCOUNTER — Encounter (HOSPITAL_COMMUNITY): Payer: Self-pay | Admitting: Emergency Medicine

## 2015-09-13 DIAGNOSIS — E1169 Type 2 diabetes mellitus with other specified complication: Secondary | ICD-10-CM | POA: Diagnosis not present

## 2015-09-13 DIAGNOSIS — E291 Testicular hypofunction: Secondary | ICD-10-CM | POA: Diagnosis present

## 2015-09-13 DIAGNOSIS — E1165 Type 2 diabetes mellitus with hyperglycemia: Secondary | ICD-10-CM | POA: Diagnosis not present

## 2015-09-13 DIAGNOSIS — Z888 Allergy status to other drugs, medicaments and biological substances status: Secondary | ICD-10-CM

## 2015-09-13 DIAGNOSIS — I2510 Atherosclerotic heart disease of native coronary artery without angina pectoris: Secondary | ICD-10-CM | POA: Diagnosis present

## 2015-09-13 DIAGNOSIS — N189 Chronic kidney disease, unspecified: Secondary | ICD-10-CM | POA: Diagnosis not present

## 2015-09-13 DIAGNOSIS — Z794 Long term (current) use of insulin: Secondary | ICD-10-CM | POA: Diagnosis not present

## 2015-09-13 DIAGNOSIS — E785 Hyperlipidemia, unspecified: Secondary | ICD-10-CM | POA: Diagnosis present

## 2015-09-13 DIAGNOSIS — E1110 Type 2 diabetes mellitus with ketoacidosis without coma: Secondary | ICD-10-CM | POA: Diagnosis present

## 2015-09-13 DIAGNOSIS — Z7902 Long term (current) use of antithrombotics/antiplatelets: Secondary | ICD-10-CM

## 2015-09-13 DIAGNOSIS — E1142 Type 2 diabetes mellitus with diabetic polyneuropathy: Secondary | ICD-10-CM | POA: Diagnosis present

## 2015-09-13 DIAGNOSIS — D508 Other iron deficiency anemias: Secondary | ICD-10-CM | POA: Diagnosis not present

## 2015-09-13 DIAGNOSIS — F1110 Opioid abuse, uncomplicated: Secondary | ICD-10-CM | POA: Insufficient documentation

## 2015-09-13 DIAGNOSIS — G8929 Other chronic pain: Secondary | ICD-10-CM | POA: Diagnosis present

## 2015-09-13 DIAGNOSIS — Z79899 Other long term (current) drug therapy: Secondary | ICD-10-CM

## 2015-09-13 DIAGNOSIS — J449 Chronic obstructive pulmonary disease, unspecified: Secondary | ICD-10-CM | POA: Diagnosis present

## 2015-09-13 DIAGNOSIS — E1310 Other specified diabetes mellitus with ketoacidosis without coma: Secondary | ICD-10-CM | POA: Diagnosis not present

## 2015-09-13 DIAGNOSIS — Z7982 Long term (current) use of aspirin: Secondary | ICD-10-CM | POA: Diagnosis not present

## 2015-09-13 DIAGNOSIS — D649 Anemia, unspecified: Secondary | ICD-10-CM | POA: Diagnosis present

## 2015-09-13 DIAGNOSIS — E0810 Diabetes mellitus due to underlying condition with ketoacidosis without coma: Secondary | ICD-10-CM | POA: Diagnosis not present

## 2015-09-13 DIAGNOSIS — I129 Hypertensive chronic kidney disease with stage 1 through stage 4 chronic kidney disease, or unspecified chronic kidney disease: Secondary | ICD-10-CM | POA: Diagnosis present

## 2015-09-13 DIAGNOSIS — E1051 Type 1 diabetes mellitus with diabetic peripheral angiopathy without gangrene: Secondary | ICD-10-CM | POA: Diagnosis present

## 2015-09-13 DIAGNOSIS — E113299 Type 2 diabetes mellitus with mild nonproliferative diabetic retinopathy without macular edema, unspecified eye: Secondary | ICD-10-CM | POA: Diagnosis not present

## 2015-09-13 DIAGNOSIS — Z79891 Long term (current) use of opiate analgesic: Secondary | ICD-10-CM

## 2015-09-13 DIAGNOSIS — Z91030 Bee allergy status: Secondary | ICD-10-CM | POA: Diagnosis not present

## 2015-09-13 DIAGNOSIS — I252 Old myocardial infarction: Secondary | ICD-10-CM

## 2015-09-13 DIAGNOSIS — N400 Enlarged prostate without lower urinary tract symptoms: Secondary | ICD-10-CM | POA: Diagnosis not present

## 2015-09-13 DIAGNOSIS — Z87891 Personal history of nicotine dependence: Secondary | ICD-10-CM | POA: Diagnosis not present

## 2015-09-13 DIAGNOSIS — R739 Hyperglycemia, unspecified: Secondary | ICD-10-CM

## 2015-09-13 DIAGNOSIS — G40209 Localization-related (focal) (partial) symptomatic epilepsy and epileptic syndromes with complex partial seizures, not intractable, without status epilepticus: Secondary | ICD-10-CM | POA: Diagnosis present

## 2015-09-13 LAB — BASIC METABOLIC PANEL
ANION GAP: 20 — AB (ref 5–15)
BUN: 30 mg/dL — ABNORMAL HIGH (ref 6–20)
CHLORIDE: 87 mmol/L — AB (ref 101–111)
CO2: 22 mmol/L (ref 22–32)
Calcium: 9.7 mg/dL (ref 8.9–10.3)
Creatinine, Ser: 1.94 mg/dL — ABNORMAL HIGH (ref 0.61–1.24)
GFR, EST AFRICAN AMERICAN: 38 mL/min — AB (ref >60)
GFR, EST NON AFRICAN AMERICAN: 33 mL/min — AB (ref >60)
Glucose, Bld: 738 mg/dL (ref 65–99)
POTASSIUM: 5.4 mmol/L — AB (ref 3.5–5.1)
SODIUM: 129 mmol/L — AB (ref 135–145)

## 2015-09-13 LAB — CBC
HEMATOCRIT: 31.3 % — AB (ref 39.0–52.0)
Hemoglobin: 10 g/dL — ABNORMAL LOW (ref 13.0–17.0)
MCH: 30.7 pg (ref 26.0–34.0)
MCHC: 31.9 g/dL (ref 30.0–36.0)
MCV: 96 fL (ref 78.0–100.0)
Platelets: 295 10*3/uL (ref 150–400)
RBC: 3.26 MIL/uL — AB (ref 4.22–5.81)
RDW: 12.8 % (ref 11.5–15.5)
WBC: 7.5 10*3/uL (ref 4.0–10.5)

## 2015-09-13 LAB — I-STAT VENOUS BLOOD GAS, ED
Acid-base deficit: 5 mmol/L — ABNORMAL HIGH (ref 0.0–2.0)
Bicarbonate: 22.2 mEq/L (ref 20.0–24.0)
O2 Saturation: 60 %
PH VEN: 7.275 (ref 7.250–7.300)
TCO2: 24 mmol/L (ref 0–100)
pCO2, Ven: 47.8 mmHg (ref 45.0–50.0)
pO2, Ven: 36 mmHg (ref 30.0–45.0)

## 2015-09-13 LAB — CBG MONITORING, ED
Glucose-Capillary: >600 mg/dL (ref 65–99)
Glucose-Capillary: >600 mg/dL (ref 65–99)

## 2015-09-13 LAB — URINALYSIS, ROUTINE W REFLEX MICROSCOPIC
Hgb urine dipstick: NEGATIVE
Ketones, ur: 15 mg/dL — AB
LEUKOCYTES UA: NEGATIVE
NITRITE: NEGATIVE
PH: 5 (ref 5.0–8.0)
PROTEIN: NEGATIVE mg/dL
Specific Gravity, Urine: 1.026 (ref 1.005–1.030)

## 2015-09-13 LAB — URINE MICROSCOPIC-ADD ON
RBC / HPF: NONE SEEN RBC/hpf (ref 0–5)
WBC, UA: NONE SEEN WBC/hpf (ref 0–5)

## 2015-09-13 LAB — I-STAT CG4 LACTIC ACID, ED: Lactic Acid, Venous: 1.49 mmol/L (ref 0.5–2.0)

## 2015-09-13 MED ORDER — SODIUM CHLORIDE 0.9 % IV SOLN
INTRAVENOUS | Status: DC
  Administered 2015-09-13: 5.4 [IU]/h via INTRAVENOUS
  Filled 2015-09-13: qty 2.5

## 2015-09-13 MED ORDER — DEXTROSE-NACL 5-0.45 % IV SOLN: INTRAVENOUS | Status: DC

## 2015-09-13 MED ORDER — SODIUM CHLORIDE 0.9 % IV BOLUS (SEPSIS)
1000.0000 mL | Freq: Once | INTRAVENOUS | Status: AC
  Administered 2015-09-13: 1000 mL via INTRAVENOUS

## 2015-09-13 NOTE — ED Notes (Signed)
Daughter-in-law reports pt lives by himself.  She went to get him today for a Dr's appt and he has not ate or had insulin in 2 days.  States he only wanted to sleep.  Recently seen for same.

## 2015-09-13 NOTE — ED Provider Notes (Signed)
CSN: [id number]     Arrival date & time 09/13/15  1939 History   First MD Initiated Contact with Patient 09/13/15 2200     Chief Complaint  Patient presents with  . Hyperglycemia     (Consider location/radiation/quality/duration/timing/severity/associated sxs/prior Treatment) HPI   Benjamin Hodge is a 75 y.o. male, with a history of poorly controlled diabetes mellitus, COPD, CAD, MI, and hypertension, presenting to the ED with hyperglycemia. Hasn't taken his long acting for two days, no insulin yesterday, and hasn't eaten for two days. Pt states his blood sugar at home read "Hi" on his home meter at 3pm, he took 7 units of Humalog. Checked it again and got 500. Lives alone and family requests case management consult. Pt had one visit from home health, but then they haven't been able to get a hold of him by phone to set up future appointments. Patient's daughter-in-law, Gaylyn Rong, voices concern about the patient possibly overusing his hydrocodone. Patient denies chest pain, shortness of breath, abdominal pain, nausea/vomiting/diarrhea, dizziness, LOC, or any other complaints.   Past Medical History  Diagnosis Date  . CAD (coronary artery disease)     LAD 40-50% stenosis, first diagonal small 90% stenosis, circumflex 70% stenosis, OM 80% stenosis, right coronary artery 80-90% stenosis.  . Right upper lobe pneumonia 11/28/2008  . Anxiety 07/18/2008  . Irritable bowel syndrome 04/27/2008  . COPD (chronic obstructive pulmonary disease) (HCC) 02/27/2008  . B12 deficiency 02/16/2008    in 5/09: B12 262, MMA 1040  . Chronic diarrhea 01/01/2008    s/p EGD 10/06: H pylori + gastritis, duodenal biopsy normal (Dr. Elnoria Howard); s/p colonoscopy 10/06: hemorrhoids (Dr. Elnoria Howard); evaluated by Dr. Yancey Flemings  in 8/09: tissue transglutaminase AB negative, VIP normal, stool fat content normal, urine 5-HIAA normal; normal GES 11/09 (done for "fluctuating sugars")  . Mild nonproliferative diabetic retinopathy(362.04)  11/18/2007  . Orthostatic hypotension 02/09/2007  . Hypertension 02/09/2007  . Spinal stenosis in cervical region 05/03    MRI  . Erectile dysfunction 09/27/2006    s/p penile implant  . Anemia, iron deficiency 09/27/2006    neg colonoscopy 2006 - Dr. Elnoria Howard; ferritin 152; hgb  15.7  on 07/07  . Hypothyroidism 09/27/2006    TSH 2.639  07/07  . Hypersomnia 09/27/2006    evaluated by Dr. Jetty Duhamel (5/08) and Dr. Vickey Huger; PSG 8/09: chronic delayed sleep phase syndrome (patient sleeps during the day and is awake at night), nocturnal myoclonus (eval for RLS and IDA suggested). Pt advised to change sleeping behavior  . Diabetes mellitus type II 09/07/1984    poorly controlled, complicated by peripheral neuropathy, microalbuminuria, mild non proliferative retinopathy, s/p DKA 7/05, on insulin pump x 4/09, started a pump vacation on 11/19/2008  . Hyperlipidemia   . 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 Hemorrhoids   . Chronic kidney disease   . Unspecified hereditary and idiopathic peripheral neuropathy   . Other chronic pain   . Other malaise and fatigue   . Depression   . Hypertrophy of prostate without urinary obstruction and other lower urinary tract symptoms (LUTS)   . Lumbago   . Heart attack (HCC) 03/2014    mild   Past Surgical History  Procedure Laterality Date  . Penile prosthesis implant    . Tonsillectomy    . Colonoscopy  06/25/2005    Dr. Jeani Hawking  . Left heart catheterization with coronary angiogram [name] 03/13/2014    Procedure: LEFT HEART CATHETERIZATION WITH CORONARY ANGIOGRAM;  Surgeon: Peter M Swaziland,  MD;  Location: [facility] CATH LAB;  Service: Cardiovascular;  Laterality: [name];  . Peripheral vascular catheterization [name] 02/19/2015    Procedure: Abdominal Aortogram;  Surgeon: Nada Libman, MD;  Location: [facility] INVASIVE CV LAB;  Service: Cardiovascular;  Laterality: [name];  . Peripheral vascular catheterization Left 02/19/2015    Procedure: Lower Extremity Angiography;  Surgeon: Nada Libman, MD;  Location: [facility] INVASIVE CV LAB;  Service: Cardiovascular;  Laterality: Left;   Family History  Problem Relation Age of Onset  . Bone cancer Mother   . Breast cancer Mother   . Cancer Mother   . Lung cancer Father   . Cancer Father   . Breast cancer Sister   . Cancer Sister   . Lung cancer Brother   . Cancer Sister     type unknown   Social History  Substance Use Topics  . Smoking status: Former Smoker    Types: Cigarettes    Quit date: 09/28/2004  . Smokeless tobacco: Never Used  . Alcohol Use: 0.0 oz/week    0 Standard drinks or equivalent per week     Comment: occ/social (moderate)    Review of Systems  Constitutional: Negative for fever, chills and diaphoresis.  Respiratory: Negative for shortness of breath.   Gastrointestinal: Negative for nausea and vomiting.  Endocrine: Positive for polydipsia and polyuria.       Hyperglycemia  All other systems reviewed and are negative.     Allergies  Ace inhibitors; Cymbalta; Lipitor; Bee venom; Glucerna; and Angiotensin receptor blockers  Home Medications   Prior to Admission medications   Medication Sig Start Date End Date Taking? Authorizing Provider  aspirin 81 MG tablet Take 81 mg by mouth at bedtime.    Yes Historical Provider, MD  clopidogrel (PLAVIX) 75 MG tablet TAKE 1 TABLET BY MOUTH DAILY WITH BREAKFAST 06/11/15  Yes Kimber Relic, MD  escitalopram (LEXAPRO) 20 MG tablet TAKE ONE TABLET BY MOUTH ONE TIME DAILY 08/07/15  Yes Kimber Relic, MD  HYDROcodone-acetaminophen (NORCO/VICODIN) 5-325 MG tablet Take 1 tablet by mouth 5 (five) times daily. 08/16/15  Yes Historical Provider, MD  Insulin Glargine (TOUJEO SOLOSTAR) 300 UNIT/ML SOPN Inject 14 Units into the skin at bedtime. 09/08/15  Yes Catarina Hartshorn, MD  insulin lispro (HUMALOG) 100 UNIT/ML KiwkPen Inject 1-6 Units into the skin daily as needed (100-199 = 1 units, 200-299 = 2 units, 300-399 = 3 units, 400-499 = 4 units, 500-599 = 5 units, 600+ = 6 units).   03/16/13  Yes Reather Littler, MD  levETIRAcetam (KEPPRA) 750 MG tablet Take 1/2 tablet in morning. Take whole tablet at night. Patient taking differently: Take 375-750 mg by mouth 2 (two) times daily. Take 1/2 tablet in morning. Take whole tablet at night. 07/23/15  Yes Carmen Dohmeier, MD  levothyroxine (SYNTHROID, LEVOTHROID) 75 MCG tablet Take 1 tablet (75 mcg total) by mouth daily. 05/10/15  Yes Kimber Relic, MD  metoprolol tartrate (LOPRESSOR) 25 MG tablet One twice daily to control BP Patient taking differently: Take 25 mg by mouth 2 (two) times daily. One twice daily to control BP 05/29/15  Yes Kimber Relic, MD  pantoprazole (PROTONIX) 40 MG tablet TAKE ONE TABLET BY MOUTH ONE TIME DAILY 05/14/15  Yes Kimber Relic, MD  pravastatin (PRAVACHOL) 20 MG tablet Take 1 tablet (20 mg total) by mouth every evening. NEEDS APPOINTMENT FOR FUTURE REFILLS OR 90-DAY SUPPLY 09/07/15  Yes Rollene Rotunda, MD  pregabalin (LYRICA) 50 MG capsule Take one capsule  by mouth in the morning and two capsules by mouth in the evening for pains. 08/14/15  Yes Kimber Relic, MD  saccharomyces boulardii (FLORASTOR) 250 MG capsule Take 1 capsule (250 mg total) by mouth 2 (two) times daily. Patient taking differently: Take 250-500 mg by mouth every evening.  02/22/15  Yes Osvaldo Shipper, MD  vitamin B-12 (CYANOCOBALAMIN) 1000 MCG tablet Take 1,000 mcg by mouth daily.   Yes Historical Provider, MD  feeding supplement, GLUCERNA SHAKE, (GLUCERNA SHAKE) LIQD Take 237 mLs by mouth 3 (three) times daily between meals. 09/08/15   Catarina Hartshorn, MD  Multiple Vitamin (MULTIVITAMIN WITH MINERALS) TABS tablet Take 1 tablet by mouth daily. 02/22/15   Osvaldo Shipper, MD  ONE TOUCH ULTRA TEST test strip TEST 8 TIMES PER DAY FOR 30 DAYS 04/26/14   Reather Littler, MD   BP 110/46 mmHg  Pulse 62  Temp(Src) 97.4 F (36.3 C) (Oral)  Resp 18  SpO2 97% Physical Exam  Constitutional: He is oriented to person, place, and time. He appears well-developed and  well-nourished. No distress.  HENT:  Head: Normocephalic and atraumatic.  Eyes: Conjunctivae are normal. Pupils are equal, round, and reactive to light.  Neck: Normal range of motion. Neck supple.  Cardiovascular: Normal rate, regular rhythm and normal heart sounds.   Pulmonary/Chest: Effort normal and breath sounds normal. No respiratory distress.  Abdominal: Soft. Bowel sounds are normal.  Musculoskeletal: He exhibits no edema or tenderness.  Lymphadenopathy:    He has no cervical adenopathy.  Neurological: He is alert and oriented to person, place, and time. He has normal reflexes.  Skin: Skin is warm and dry. He is not diaphoretic.  Nursing note and vitals reviewed.   ED Course  Procedures (including critical care time) Labs Review Labs Reviewed  BASIC METABOLIC PANEL - Abnormal; Notable for the following:    Sodium 129 (*)    Potassium 5.4 (*)    Chloride 87 (*)    Glucose, Bld 738 (*)    BUN 30 (*)    Creatinine, Ser 1.94 (*)    GFR calc non Af Amer 33 (*)    GFR calc Af Amer 38 (*)    Anion gap 20 (*)    All other components within normal limits  CBC - Abnormal; Notable for the following:    RBC 3.26 (*)    Hemoglobin 10.0 (*)    HCT 31.3 (*)    All other components within normal limits  URINALYSIS, ROUTINE W REFLEX MICROSCOPIC (NOT AT [facility]) - Abnormal; Notable for the following:    APPearance CLOUDY (*)    Glucose, UA >1000 (*)    Bilirubin Urine SMALL (*)    Ketones, ur 15 (*)    All other components within normal limits  URINE MICROSCOPIC-ADD ON - Abnormal; Notable for the following:    Squamous Epithelial / LPF 0-5 (*)    Bacteria, UA RARE (*)    All other components within normal limits  CBG MONITORING, ED - Abnormal; Notable for the following:    Glucose-Capillary >600 (*)    All other components within normal limits  CBG MONITORING, ED - Abnormal; Notable for the following:    Glucose-Capillary >600 (*)    All other components within normal limits   I-STAT VENOUS BLOOD GAS, ED - Abnormal; Notable for the following:    Acid-base deficit 5.0 (*)    All other components within normal limits  BLOOD GAS, VENOUS  I-STAT CG4 LACTIC ACID, ED  I have personally reviewed and evaluated these lab results as part of my medical decision-making.   EKG Interpretation None       MDM   Final diagnoses:  Hyperglycemia    CAMEO SHEWELL presents with hyperglycemia last 2 days at least.  Findings and plan of care discussed with Lyndal Pulley, MD.  This patient has classical presentation for hyperglycemia with an anion gap. Patient is not yet acidotic, not tachycardic, not ill-appearing, and shows no apparent distress. Patient was started on IV insulin and IV fluid boluses under the glucose management order set. 11:38 PM Spoke with Dr. Antionette Char, who agreed to admit the patient to telemetry. No further instructions.  Filed Vitals:   09/13/15 2008 09/13/15 2249  BP: 138/120 110/46  Pulse: 56 62  Temp: 97.4 F (36.3 C)   TempSrc: Oral   Resp: 18 18  SpO2: 92% 97%     Anselm Pancoast, PA-C 09/13/15 2355  Lyndal Pulley, MD 09/15/15 1222

## 2015-09-13 NOTE — ED Notes (Signed)
Pt here for elevated cbg, recent admit for same.

## 2015-09-13 NOTE — ED Notes (Signed)
CHECKED CBG READS HI, RN KAREN INFORMED

## 2015-09-14 ENCOUNTER — Inpatient Hospital Stay (HOSPITAL_COMMUNITY): Payer: Medicare Other

## 2015-09-14 DIAGNOSIS — F1110 Opioid abuse, uncomplicated: Secondary | ICD-10-CM | POA: Insufficient documentation

## 2015-09-14 DIAGNOSIS — E1310 Other specified diabetes mellitus with ketoacidosis without coma: Principal | ICD-10-CM

## 2015-09-14 DIAGNOSIS — J449 Chronic obstructive pulmonary disease, unspecified: Secondary | ICD-10-CM

## 2015-09-14 DIAGNOSIS — D508 Other iron deficiency anemias: Secondary | ICD-10-CM

## 2015-09-14 DIAGNOSIS — E1110 Type 2 diabetes mellitus with ketoacidosis without coma: Secondary | ICD-10-CM | POA: Diagnosis present

## 2015-09-14 DIAGNOSIS — E0810 Diabetes mellitus due to underlying condition with ketoacidosis without coma: Secondary | ICD-10-CM

## 2015-09-14 LAB — GLUCOSE, CAPILLARY
GLUCOSE-CAPILLARY: 280 mg/dL — AB (ref 65–99)
GLUCOSE-CAPILLARY: 88 mg/dL (ref 65–99)
GLUCOSE-CAPILLARY: 124 mg/dL — AB (ref 65–99)
GLUCOSE-CAPILLARY: 309 mg/dL — AB (ref 65–99)
GLUCOSE-CAPILLARY: 414 mg/dL — AB (ref 65–99)
Glucose-Capillary: 413 mg/dL — ABNORMAL HIGH (ref 65–99)
Glucose-Capillary: 355 mg/dL — ABNORMAL HIGH (ref 65–99)
Glucose-Capillary: 153 mg/dL — ABNORMAL HIGH (ref 65–99)
Glucose-Capillary: 92 mg/dL (ref 65–99)
Glucose-Capillary: 122 mg/dL — ABNORMAL HIGH (ref 65–99)
Glucose-Capillary: 296 mg/dL — ABNORMAL HIGH (ref 65–99)

## 2015-09-14 LAB — BASIC METABOLIC PANEL
ANION GAP: 10 (ref 5–15)
ANION GAP: 14 (ref 5–15)
BUN: 27 mg/dL — AB (ref 6–20)
BUN: 30 mg/dL — AB (ref 6–20)
CHLORIDE: 101 mmol/L (ref 101–111)
CHLORIDE: 95 mmol/L — AB (ref 101–111)
CO2: 23 mmol/L (ref 22–32)
CO2: 28 mmol/L (ref 22–32)
Calcium: 9 mg/dL (ref 8.9–10.3)
Calcium: 9.4 mg/dL (ref 8.9–10.3)
Creatinine, Ser: 1.72 mg/dL — ABNORMAL HIGH (ref 0.61–1.24)
Creatinine, Ser: 1.55 mg/dL — ABNORMAL HIGH (ref 0.61–1.24)
GFR calc Af Amer: 44 mL/min — ABNORMAL LOW (ref >60)
GFR calc Af Amer: 50 mL/min — ABNORMAL LOW (ref >60)
GFR, EST NON AFRICAN AMERICAN: 38 mL/min — AB (ref >60)
GFR, EST NON AFRICAN AMERICAN: 43 mL/min — AB (ref >60)
GLUCOSE: 585 mg/dL — AB (ref 65–99)
Glucose, Bld: 75 mg/dL (ref 65–99)
POTASSIUM: 3.6 mmol/L (ref 3.5–5.1)
POTASSIUM: 4.5 mmol/L (ref 3.5–5.1)
SODIUM: 132 mmol/L — AB (ref 135–145)
SODIUM: 139 mmol/L (ref 135–145)

## 2015-09-14 LAB — MAGNESIUM: MAGNESIUM: 1.8 mg/dL (ref 1.7–2.4)

## 2015-09-14 LAB — GLUCOSE, RANDOM: Glucose, Bld: 569 mg/dL (ref 65–99)

## 2015-09-14 LAB — TROPONIN I
TROPONIN I: 0.03 ng/mL (ref <0.031)
TROPONIN I: 0.03 ng/mL (ref <0.031)

## 2015-09-14 LAB — BETA-HYDROXYBUTYRIC ACID: BETA-HYDROXYBUTYRIC ACID: 3.18 mmol/L — AB (ref 0.05–0.27)

## 2015-09-14 LAB — CBG MONITORING, ED: Glucose-Capillary: 538 mg/dL — ABNORMAL HIGH (ref 65–99)

## 2015-09-14 LAB — PHOSPHORUS: PHOSPHORUS: 3.3 mg/dL (ref 2.5–4.6)

## 2015-09-14 MED ORDER — CLOPIDOGREL BISULFATE 75 MG PO TABS
75.0000 mg | ORAL_TABLET | Freq: Every day | ORAL | Status: DC
  Administered 2015-09-14 – 2015-09-15 (×2): 75 mg via ORAL
  Filled 2015-09-14 (×2): qty 1

## 2015-09-14 MED ORDER — DEXTROSE-NACL 5-0.45 % IV SOLN
INTRAVENOUS | Status: DC
Start: 2015-09-14 — End: 2015-09-14
  Administered 2015-09-14: 04:00:00 via INTRAVENOUS

## 2015-09-14 MED ORDER — HEPARIN SODIUM (PORCINE) 5000 UNIT/ML IJ SOLN
5000.0000 [IU] | Freq: Three times a day (TID) | INTRAMUSCULAR | Status: DC
Start: 2015-09-14 — End: 2015-09-15
  Administered 2015-09-14 – 2015-09-15 (×4): 5000 [IU] via SUBCUTANEOUS
  Filled 2015-09-14 (×5): qty 1

## 2015-09-14 MED ORDER — SODIUM CHLORIDE 0.9 % IV BOLUS (SEPSIS)
500.0000 mL | Freq: Once | INTRAVENOUS | Status: AC
  Administered 2015-09-14: 500 mL via INTRAVENOUS

## 2015-09-14 MED ORDER — INSULIN REGULAR HUMAN 100 UNIT/ML IJ SOLN
INTRAMUSCULAR | Status: DC
  Administered 2015-09-14: 6.6 [IU]/h via INTRAVENOUS
  Administered 2015-09-14: 2.8 [IU]/h via INTRAVENOUS
  Administered 2015-09-14: 8.9 [IU]/h via INTRAVENOUS
  Administered 2015-09-14: 0.3 [IU]/h via INTRAVENOUS

## 2015-09-14 MED ORDER — ASPIRIN EC 81 MG PO TBEC
81.0000 mg | DELAYED_RELEASE_TABLET | Freq: Every day | ORAL | Status: DC
  Administered 2015-09-14 (×2): 81 mg via ORAL
  Filled 2015-09-14 (×2): qty 1

## 2015-09-14 MED ORDER — LEVETIRACETAM 750 MG PO TABS
750.0000 mg | ORAL_TABLET | Freq: Every day | ORAL | Status: DC
  Administered 2015-09-14 (×2): 750 mg via ORAL
  Filled 2015-09-14 (×3): qty 1

## 2015-09-14 MED ORDER — VITAMIN B-12 1000 MCG PO TABS
1000.0000 ug | ORAL_TABLET | Freq: Every day | ORAL | Status: DC
  Administered 2015-09-14 – 2015-09-15 (×2): 1000 ug via ORAL
  Filled 2015-09-14 (×2): qty 1

## 2015-09-14 MED ORDER — INSULIN ASPART 100 UNIT/ML ~~LOC~~ SOLN
0.0000 [IU] | Freq: Every day | SUBCUTANEOUS | Status: DC
  Administered 2015-09-14: 2 [IU] via SUBCUTANEOUS

## 2015-09-14 MED ORDER — PANTOPRAZOLE SODIUM 40 MG PO TBEC
40.0000 mg | DELAYED_RELEASE_TABLET | Freq: Every day | ORAL | Status: DC
  Administered 2015-09-14 – 2015-09-15 (×2): 40 mg via ORAL
  Filled 2015-09-14 (×2): qty 1

## 2015-09-14 MED ORDER — SODIUM CHLORIDE 0.9 % IV SOLN: INTRAVENOUS | Status: AC

## 2015-09-14 MED ORDER — ESCITALOPRAM OXALATE 20 MG PO TABS
20.0000 mg | ORAL_TABLET | Freq: Every day | ORAL | Status: DC
  Administered 2015-09-14 – 2015-09-15 (×2): 20 mg via ORAL
  Filled 2015-09-14 (×2): qty 1

## 2015-09-14 MED ORDER — PREGABALIN 25 MG PO CAPS
50.0000 mg | ORAL_CAPSULE | Freq: Two times a day (BID) | ORAL | Status: DC
  Administered 2015-09-14 – 2015-09-15 (×4): 50 mg via ORAL
  Filled 2015-09-14 (×4): qty 2

## 2015-09-14 MED ORDER — INSULIN GLARGINE 100 UNIT/ML ~~LOC~~ SOLN
12.0000 [IU] | Freq: Every day | SUBCUTANEOUS | Status: DC
  Administered 2015-09-15: 12 [IU] via SUBCUTANEOUS
  Filled 2015-09-14 (×2): qty 0.12

## 2015-09-14 MED ORDER — SODIUM CHLORIDE 0.9 % IV SOLN
INTRAVENOUS | Status: DC
  Administered 2015-09-14 (×2): via INTRAVENOUS

## 2015-09-14 MED ORDER — METOPROLOL TARTRATE 12.5 MG HALF TABLET
12.5000 mg | ORAL_TABLET | Freq: Two times a day (BID) | ORAL | Status: DC
  Filled 2015-09-14: qty 1

## 2015-09-14 MED ORDER — HYDROCODONE-ACETAMINOPHEN 5-325 MG PO TABS
1.0000 | ORAL_TABLET | ORAL | Status: DC | PRN
  Administered 2015-09-14 – 2015-09-15 (×3): 1 via ORAL
  Filled 2015-09-14 (×3): qty 1

## 2015-09-14 MED ORDER — PRAVASTATIN SODIUM 20 MG PO TABS
20.0000 mg | ORAL_TABLET | Freq: Every evening | ORAL | Status: DC
  Administered 2015-09-15: 20 mg via ORAL
  Filled 2015-09-14: qty 1

## 2015-09-14 MED ORDER — INSULIN ASPART 100 UNIT/ML ~~LOC~~ SOLN
8.0000 [IU] | Freq: Once | SUBCUTANEOUS | Status: AC
Start: 2015-09-14 — End: 2015-09-14
  Administered 2015-09-14: 8 [IU] via SUBCUTANEOUS

## 2015-09-14 MED ORDER — INSULIN ASPART 100 UNIT/ML ~~LOC~~ SOLN
0.0000 [IU] | Freq: Three times a day (TID) | SUBCUTANEOUS | Status: DC
  Administered 2015-09-14: 9 [IU] via SUBCUTANEOUS
  Administered 2015-09-14 – 2015-09-15 (×2): 7 [IU] via SUBCUTANEOUS

## 2015-09-14 MED ORDER — INSULIN GLARGINE 100 UNIT/ML ~~LOC~~ SOLN
8.0000 [IU] | Freq: Every day | SUBCUTANEOUS | Status: DC
  Administered 2015-09-14: 8 [IU] via SUBCUTANEOUS
  Filled 2015-09-14 (×2): qty 0.08

## 2015-09-14 MED ORDER — LEVOTHYROXINE SODIUM 75 MCG PO TABS
75.0000 ug | ORAL_TABLET | Freq: Every day | ORAL | Status: DC
  Administered 2015-09-14 – 2015-09-15 (×2): 75 ug via ORAL
  Filled 2015-09-14 (×2): qty 1

## 2015-09-14 MED ORDER — INSULIN ASPART 100 UNIT/ML ~~LOC~~ SOLN
15.0000 [IU] | Freq: Once | SUBCUTANEOUS | Status: AC
  Administered 2015-09-14: 15 [IU] via SUBCUTANEOUS

## 2015-09-14 NOTE — H&P (Signed)
[facility] History and Physical  Benjamin Hodge WUJ:[id number] DOB: 1941-03-07 DOA: 09/13/2015  Referring physician: ED physician PCP: Kimber Relic, MD  Specialists:   Chief Complaint: Sugars out of control   HPI: Benjamin Hodge is a 75 y.o. male with PMH of insulin-dependent diabetes mellitus, chronic pain with narcotic dependency, CAD, COPD, and depression who presents to the ED when he was not able to control the sugar with insulin at home. Mr. Benjamin Hodge had not taking his insulin for 2 days, explaining that he skipped it because he wasn't eating. When he checked his glucose earlier in the day, the glucometer read "high." He states that he give himself 7 units of Humalog and was able to obtain a reading in the 500s. He denies any recent fevers, chills, cough, chest pains, palpitations, headaches, nausea, vomiting, or diarrhea. States that he's been quite well aside from his chronic back pain that he describes as severe.  In ED, patient was found to be afebrile, saturating well on room air with bradycardia, but vitals otherwise stable. Serum glucose returned elevated in the 700s with the electrolyte derangements including hyponatremia, hyperkalemia, and a AKI. Patient was acidotic on the blood gas and ketones are noted in both serum and urine. He was bolused normal saline and started on glucose stabilizer. He remained stable in the emergency department and was admitted for further evaluation and management of DKA.  Where does patient live?   At home     Can patient participate in ADLs?  Yes        Review of Systems:   General: no fevers, chills, sweats, or weight change, + poor appetite, fatigue HEENT: no blurry vision, hearing changes or sore throat Pulm: no dyspnea, cough, or wheeze CV: no chest pain or palpitations Abd:  + nausea, no vomiting, abdominal pain, diarrhea, or constipation GU: no dysuria, hematuria, increased urinary frequency, or urgency  Ext: no leg  edema Neuro: no focal weakness, numbness, or tingling, no vision change or hearing loss Skin: no rash, no wounds MSK: No muscle spasm, no deformity, no red, hot, or swollen joint Heme: No easy bruising or bleeding Travel history: No recent long distant travel    Allergy:  Allergies  Allergen Reactions  . Ace Inhibitors Other (See Comments)    dizzy  . Cymbalta [Duloxetine Hcl] Other (See Comments)    dizzy  . Lipitor [Atorvastatin] Other (See Comments)    Caused pain all over body.  . Bee Venom Swelling  . Glucerna [Nutritional Supplements] Diarrhea  . Angiotensin Receptor Blockers Other (See Comments)    unknown    Past Medical History  Diagnosis Date  . CAD (coronary artery disease)     LAD 40-50% stenosis, first diagonal small 90% stenosis, circumflex 70% stenosis, OM 80% stenosis, right coronary artery 80-90% stenosis.  . Right upper lobe pneumonia 11/28/2008  . Anxiety 07/18/2008  . Irritable bowel syndrome 04/27/2008  . COPD (chronic obstructive pulmonary disease) (HCC) 02/27/2008  . B12 deficiency 02/16/2008    in 5/09: B12 262, MMA 1040  . Chronic diarrhea 01/01/2008    s/p EGD 10/06: H pylori + gastritis, duodenal biopsy normal (Dr. Elnoria Howard); s/p colonoscopy 10/06: hemorrhoids (Dr. Elnoria Howard); evaluated by Dr. Yancey Flemings  in 8/09: tissue transglutaminase AB negative, VIP normal, stool fat content normal, urine 5-HIAA normal; normal GES 11/09 (done for "fluctuating sugars")  . 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 Orthostatic hypotension 02/09/2007  . Hypertension 02/09/2007  . Spinal stenosis in cervical  region 05/03    MRI  . Erectile dysfunction 09/27/2006    s/p penile implant  . Anemia, iron deficiency 09/27/2006    neg colonoscopy 2006 - Dr. Elnoria Howard; ferritin 152; hgb  15.7  on 07/07  . Hypothyroidism 09/27/2006    TSH 2.639  07/07  . Hypersomnia 09/27/2006    evaluated by Dr. Jetty Duhamel (5/08) and Dr. Vickey Huger; PSG 8/09: chronic delayed  sleep phase syndrome (patient sleeps during the day and is awake at night), nocturnal myoclonus (eval for RLS and IDA suggested). Pt advised to change sleeping behavior  . Diabetes mellitus type II 09/07/1984    poorly controlled, complicated by peripheral neuropathy, microalbuminuria, mild non proliferative retinopathy, s/p DKA 7/05, on insulin pump x 4/09, started a pump vacation on 11/19/2008  . Hyperlipidemia   . 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 Hemorrhoids   . Chronic kidney disease   . Unspecified hereditary and idiopathic peripheral neuropathy   . Other chronic pain   . Other malaise and fatigue   . Depression   . Hypertrophy of prostate without urinary obstruction and other lower urinary tract symptoms (LUTS)   . Lumbago   . Heart attack (HCC) 03/2014    mild    Past Surgical History  Procedure Laterality Date  . Penile prosthesis implant    . Tonsillectomy    . Colonoscopy  06/25/2005    Dr. Jeani Hawking  . Left heart catheterization with coronary angiogram [name] 03/13/2014    Procedure: LEFT HEART CATHETERIZATION WITH CORONARY ANGIOGRAM;  Surgeon: Peter M Swaziland, MD;  Location: [facility] CATH LAB;  Service: Cardiovascular;  Laterality: [name];  . Peripheral vascular catheterization [name] 02/19/2015    Procedure: Abdominal Aortogram;  Surgeon: Nada Libman, MD;  Location: [facility] INVASIVE CV LAB;  Service: Cardiovascular;  Laterality: [name];  . Peripheral vascular catheterization Left 02/19/2015    Procedure: Lower Extremity Angiography;  Surgeon: Nada Libman, MD;  Location: [facility] INVASIVE CV LAB;  Service: Cardiovascular;  Laterality: Left;    Social History:  reports that he quit smoking about 10 years ago. His smoking use included Cigarettes. He has never used smokeless tobacco. He reports that he drinks alcohol. He reports that he does not use illicit drugs.  Family History:  Family History  Problem Relation Age of Onset  . Bone cancer Mother   . Breast cancer Mother   . Cancer Mother   .  Lung cancer Father   . Cancer Father   . Breast cancer Sister   . Cancer Sister   . Lung cancer Brother   . Cancer Sister     type unknown     Prior to Admission medications   Medication Sig Start Date End Date Taking? Authorizing Provider  aspirin 81 MG tablet Take 81 mg by mouth at bedtime.    Yes Historical Provider, MD  clopidogrel (PLAVIX) 75 MG tablet TAKE 1 TABLET BY MOUTH DAILY WITH BREAKFAST 06/11/15  Yes Kimber Relic, MD  escitalopram (LEXAPRO) 20 MG tablet TAKE ONE TABLET BY MOUTH ONE TIME DAILY 08/07/15  Yes Kimber Relic, MD  HYDROcodone-acetaminophen (NORCO/VICODIN) 5-325 MG tablet Take 1 tablet by mouth 5 (five) times daily. 08/16/15  Yes Historical Provider, MD  Insulin Glargine (TOUJEO SOLOSTAR) 300 UNIT/ML SOPN Inject 14 Units into the skin at bedtime. 09/08/15  Yes Catarina Hartshorn, MD  insulin lispro (HUMALOG) 100 UNIT/ML KiwkPen Inject 1-6 Units into the skin daily as needed (100-199 = 1 units, 200-299 = 2 units, 300-399 =  3 units, 400-499 = 4 units, 500-599 = 5 units, 600+ = 6 units).  03/16/13  Yes Reather Littler, MD  levETIRAcetam (KEPPRA) 750 MG tablet Take 1/2 tablet in morning. Take whole tablet at night. Patient taking differently: Take 375-750 mg by mouth 2 (two) times daily. Take 1/2 tablet in morning. Take whole tablet at night. 07/23/15  Yes Carmen Dohmeier, MD  levothyroxine (SYNTHROID, LEVOTHROID) 75 MCG tablet Take 1 tablet (75 mcg total) by mouth daily. 05/10/15  Yes Kimber Relic, MD  metoprolol tartrate (LOPRESSOR) 25 MG tablet One twice daily to control BP Patient taking differently: Take 25 mg by mouth 2 (two) times daily. One twice daily to control BP 05/29/15  Yes Kimber Relic, MD  pantoprazole (PROTONIX) 40 MG tablet TAKE ONE TABLET BY MOUTH ONE TIME DAILY 05/14/15  Yes Kimber Relic, MD  pravastatin (PRAVACHOL) 20 MG tablet Take 1 tablet (20 mg total) by mouth every evening. NEEDS APPOINTMENT FOR FUTURE REFILLS OR 90-DAY SUPPLY 09/07/15  Yes Rollene Rotunda, MD   pregabalin (LYRICA) 50 MG capsule Take one capsule by mouth in the morning and two capsules by mouth in the evening for pains. 08/14/15  Yes Kimber Relic, MD  saccharomyces boulardii (FLORASTOR) 250 MG capsule Take 1 capsule (250 mg total) by mouth 2 (two) times daily. Patient taking differently: Take 250-500 mg by mouth every evening.  02/22/15  Yes Osvaldo Shipper, MD  vitamin B-12 (CYANOCOBALAMIN) 1000 MCG tablet Take 1,000 mcg by mouth daily.   Yes Historical Provider, MD  feeding supplement, GLUCERNA SHAKE, (GLUCERNA SHAKE) LIQD Take 237 mLs by mouth 3 (three) times daily between meals. 09/08/15   Catarina Hartshorn, MD  Multiple Vitamin (MULTIVITAMIN WITH MINERALS) TABS tablet Take 1 tablet by mouth daily. 02/22/15   Osvaldo Shipper, MD  ONE TOUCH ULTRA TEST test strip TEST 8 TIMES PER DAY FOR 30 DAYS 04/26/14   Reather Littler, MD    Physical Exam: Filed Vitals:   09/13/15 2008 09/13/15 2249  BP: 138/120 110/46  Pulse: 56 62  Temp: 97.4 F (36.3 C)   TempSrc: Oral   Resp: 18 18  SpO2: 92% 97%   General: Not in acute distress HEENT:       Eyes:  Pinpoint pupils, EOMI, no scleral icterus or conjunctival pallor.       ENT: No discharge from the ears or nose, no pharyngeal ulcers, petechiae or exudate, no tonsillar enlargement.        Neck: No JVD, no bruit, no appreciable mass Heme: No cervical adenopathy, no pallor Cardiac: S1/S2, grade II SEM, No gallops or rubs. Pulm: Tachypneic. Good air movement bilaterally. No rales, wheezing, rhonchi or rubs. Abd: Soft, nondistended, nontender, no rebound pain or gaurding, no mass or organomegaly, BS present. Ext: No LE edema bilaterally. 2+DP/PT pulse bilaterally. Musculoskeletal: No gross deformity, no red, hot, swollen joints   Skin: No rashes or wounds on exposed surfaces  Neuro: Somnolent, arousable, oriented X3, cranial nerves II-XII grossly intact, muscle strength 5/5 in all extremities, sensation to light touch intact. Brachial reflex 2+ bilaterally.  Knee reflex 2+ bilaterally. Negative Babinski's sign. Normal finger to nose test. No focal findings Psych: Patient is not overtly psychotic, denies suicidal or homocidal ideation, no active hallucinations.  Labs on Admission:  Basic Metabolic Panel:  Recent Labs Lab 09/07/15 0021 09/07/15 0445 09/07/15 0930 09/08/15 0449 09/13/15 2028  NA 137 140 140 136 129*  K 4.1 3.9 3.9 4.5 5.4*  CL 103 107 105 103  87*  CO2 24 23 27 24 22   GLUCOSE 147* 167* 150* 318* 738*  BUN 27* 25* 21* 25* 30*  CREATININE 1.26* 1.18 1.13 1.30* 1.94*  CALCIUM 8.4* 8.5* 8.9 8.6* 9.7  MG  --   --   --  2.0  --    Liver Function Tests: No results for input(s): AST, ALT, ALKPHOS, BILITOT, PROT, ALBUMIN in the last 168 hours. No results for input(s): LIPASE, AMYLASE in the last 168 hours. No results for input(s): AMMONIA in the last 168 hours. CBC:  Recent Labs Lab 09/08/15 0449 09/13/15 2028  WBC 5.5 7.5  HGB 9.0* 10.0*  HCT 27.0* 31.3*  MCV 94.7 96.0  PLT 197 295   Cardiac Enzymes: No results for input(s): CKTOTAL, CKMB, CKMBINDEX, TROPONINI in the last 168 hours.  BNP (last 3 results) No results for input(s): BNP in the last 8760 hours.  ProBNP (last 3 results) No results for input(s): PROBNP in the last 8760 hours.  CBG:  Recent Labs Lab 09/07/15 2116 09/08/15 0850 09/13/15 1949 09/13/15 2252 09/14/15 0001  GLUCAP 196* 231* >600* >600* 538*    Radiological Exams on Admission: No results found.  EKG: Independently reviewed.  Abnormal findings:     Sinus with intraventricular conduction delay    Assessment/Plan  1. DKA  - In setting of not taking insulin for 2 days  - Initial serum glucose in 700s, ketones present in urine and serum, acidotic on blood gas - Giving aggressive IVF resuscitation  - Glucose stabilizer infusing  - Will keep NPO until sugar <250 and gap closed, at which time, basal insulin can be given, he can eat, and fluids changed to D5-1/2 NS, d/c insulin gtt  2hrs later - Follow BMP q4h overnight - Pt lives alone and self-administers meds; daughter-in-law had suggested that he abuses his oxycodone and does not take the insulin; pupils pinpoint on exam and pt confirms that he takes more than intended by prescribers, will ask social work to see   2. COPD  - Not on home O2, appears to be stable  - Neb treatments prn  - Supplemental O2 prn for sat <92%    3. Anemia - Hgb 10.0 on admission, apparently stable relative to priors  - Prior labs suggest iron-deficiency  - Colonoscopy neg in 2006 with Dr. Elnoria Howard  - Monitor   4. AKI  - SCr 1.94 on admit, was <1 a yr ago  - Likely a prerenal azotemia secondary to DKA and associated intravascular volume depletion - Expect will improve with IVF, will trend    DVT ppx:   SQ heparin    Code Status: Full code Family Communication: None at bed side.      Disposition Plan: Admit to inpatient   Date of Service 09/14/2015    Briscoe Deutscher, MD [facility] Pager [phone]  If 7PM-7AM, please contact night-coverage [email or web address] Password [email or web address] 09/14/2015, 12:08 AM

## 2015-09-14 NOTE — Progress Notes (Signed)
While given report received critical glucose 569. New orders given for Novolog 15 units. RN will follow orders and continue to monitor.  Roselie Awkward, RN

## 2015-09-14 NOTE — Progress Notes (Addendum)
Inpatient Diabetes Program Recommendations  AACE/ADA: New Consensus Statement on Inpatient Glycemic Control (2015)  Target Ranges:  Prepandial:   less than 140 mg/dL      Peak postprandial:   less than 180 mg/dL (1-2 hours)      Critically ill patients:  140 - 180 mg/dL   Review of Glycemic Control  Results for Benjamin Hodge, Benjamin Hodge (MRN [id number]) as of 09/14/2015 13:15  Ref. Range 09/14/2015 05:12 09/14/2015 05:58 09/14/2015 07:33 09/14/2015 09:36 09/14/2015 12:11  Glucose-Capillary Latest Ref Range: 65-99 mg/dL 92 88 683 (H) 419 (H) 622 (H)    Diabetes history: Type 1 per notes- A1C 10.6% on 06/28/15 Outpatient Diabetes medications: Toujeo 14 units qhs, Humalog 1-6 units as needed (note says he last took this 2-3 days ago) Current orders for Inpatient glycemic control: Lantus 8 units qday, Novolog 0-9 units tid, Novolog 0-5 units qhs  Inpatient Diabetes Program Recommendations: Blood sugars elevated now, likely as a result of no correction Novolog insulin given when IV insulin was stopped and insulin infusion stopped just 1 hour after Lantus was given.   Consider increasing Lantus insulin to 12 units qday.   Consider having the patient watch patient education network videos 501-510.    Susette Racer, RN, BA, MHA, CDE Diabetes Coordinator Inpatient Diabetes Program  [phone] (Team Pager) [phone] [facility] Office) 09/14/2015 1:24 PM

## 2015-09-14 NOTE — Progress Notes (Signed)
Utilization review completed.  

## 2015-09-14 NOTE — Progress Notes (Signed)
Patient admitted after midnight. Chart reviewed. Patient examined. AG closed. Transition to [place] insulin. Patient educated about the importance of compliance with insulin regimen. He was just discharged a few days ago with DKA. Blood pressure borderline, BUN and creatinine are still elevated, though improved. Will bolus 500 mL saline. Repeat BMET tomorrow. Possibly home tomorrow if stable. Patient currently denying abusing his oxycodones.  Crista Curb, MD [facility] [email or web address] password [facility]

## 2015-09-14 NOTE — Progress Notes (Signed)
It has been 1 hour since Lantus was given. Insulin drip has been stooped per MD order.  Berdine Dance RN, BSN

## 2015-09-15 LAB — GLUCOSE, CAPILLARY
GLUCOSE-CAPILLARY: 106 mg/dL — AB (ref 65–99)
GLUCOSE-CAPILLARY: 339 mg/dL — AB (ref 65–99)
GLUCOSE-CAPILLARY: 438 mg/dL — AB (ref 65–99)

## 2015-09-15 LAB — BASIC METABOLIC PANEL
ANION GAP: 10 (ref 5–15)
BUN: 23 mg/dL — ABNORMAL HIGH (ref 6–20)
CALCIUM: 9 mg/dL (ref 8.9–10.3)
CO2: 25 mmol/L (ref 22–32)
Chloride: 105 mmol/L (ref 101–111)
Creatinine, Ser: 1.29 mg/dL — ABNORMAL HIGH (ref 0.61–1.24)
GFR, EST NON AFRICAN AMERICAN: 54 mL/min — AB (ref >60)
GLUCOSE: 58 mg/dL — AB (ref 65–99)
Potassium: 3.6 mmol/L (ref 3.5–5.1)
SODIUM: 140 mmol/L (ref 135–145)

## 2015-09-15 MED ORDER — INSULIN ASPART 100 UNIT/ML ~~LOC~~ SOLN
11.0000 [IU] | Freq: Once | SUBCUTANEOUS | Status: AC
  Administered 2015-09-15: 11 [IU] via SUBCUTANEOUS

## 2015-09-15 NOTE — Evaluation (Signed)
Physical Therapy Evaluation Patient Details Name: Benjamin Hodge MRN: [id number] DOB: 25-Feb-1941 Today's Date: 09/15/2015   History of Present Illness  75 y.o. male with PMH of insulin-dependent diabetes mellitus, chronic pain with narcotic dependency, CAD, COPD, and depression who presents to the ED when he was not able to control the sugar with insulin at home.  Clinical Impression  Pt is mod I to supervision with all functional mobility. He is A and O x 4 and answers all questions appropriately. He does appear forgetful without full understanding of the importance of managing his diabetes. Recommend family assist with medication schedule. No PT services indicated. Pt does present with mild generalized weakness. Strength should return to normal state with his return to home environment and regular activity. PT signing off.    Follow Up Recommendations No PT follow up;Supervision - Intermittent (assist from family for insulin schedule)    Equipment Recommendations  None recommended by PT    Recommendations for Other Services       Precautions / Restrictions Precautions Precautions: None      Mobility  Bed Mobility Overal bed mobility: Independent                Transfers Overall transfer level: Independent Equipment used: None                Ambulation/Gait Ambulation/Gait assistance: Supervision Ambulation Distance (Feet): 250 Feet Assistive device: None Gait Pattern/deviations: WFL(Within Functional Limits) Gait velocity: mildly decreased   General Gait Details: LOB x 1 but able to self correct  Stairs            Wheelchair Mobility    Modified Rankin (Stroke Patients Only)       Balance Overall balance assessment: Modified Independent                                           Pertinent Vitals/Pain Pain Assessment: No/denies pain    Home Living Family/patient expects to be discharged to:: Private residence Living  Arrangements: Alone Available Help at Discharge: Family;Available PRN/intermittently Type of Home: Other(Comment) (townhouse/condo) Home Access: Stairs to enter   Entrance Stairs-Number of Steps: 1 Home Layout: Two level;Able to live on main level with bedroom/bathroom Home Equipment: None      Prior Function Level of Independence: Independent               Hand Dominance   Dominant Hand: Right    Extremity/Trunk Assessment                         Communication   Communication: No difficulties  Cognition Arousal/Alertness: Awake/alert Behavior During Therapy: WFL for tasks assessed/performed Overall Cognitive Status: Within Functional Limits for tasks assessed                      General Comments      Exercises        Assessment/Plan    PT Assessment Patent does not need any further PT services  PT Diagnosis Difficulty walking   PT Problem List    PT Treatment Interventions     PT Goals (Current goals can be found in the Care Plan section) Acute Rehab PT Goals Patient Stated Goal: home PT Goal Formulation: All assessment and education complete, DC therapy    Frequency     Barriers to  discharge        Co-evaluation               End of Session   Activity Tolerance: Patient tolerated treatment well Patient left: in chair;with call bell/phone within reach Nurse Communication: Mobility status         Time: 1212-1233 PT Time Calculation (min) (ACUTE ONLY): 21 min   Charges:   PT Evaluation $PT Eval Low Complexity: 1 Procedure     PT G Codes:        Ilda Foil 09/15/2015, 1:52 PM

## 2015-09-15 NOTE — Progress Notes (Signed)
Notified Dr. Lendell Caprice of patient's blood sugar of 339; no new orders received.  PT evaluated patient and spoke with Dr. Lendell Caprice about findings.  Advised MD about the son's concerns after I spoke with him a second time.  MD to speak to son on the phone.  Will continue to monitor.

## 2015-09-15 NOTE — Progress Notes (Signed)
Discharge instructions given to patient and son; all questions answered.  Patient discharged home.  Patient escorted via wheelchair by Eugenie Norrie, NT to son's vehicle.

## 2015-09-15 NOTE — Progress Notes (Signed)
Physician Discharge Summary  Benjamin Hodge EEF:[id number] DOB: 06/05/1941 DOA: 09/13/2015  PCP: Kimber Relic, MD  Admit date: 09/13/2015 Discharge date: 09/15/2015  Time spent: greater than 30 minutes  Recommendations for Outpatient Follow-up:  1. 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  History of present illness:  75 y.o. male with PMH of insulin-dependent diabetes mellitus, chronic pain with narcotic dependency, CAD, COPD, and depression who presents to the ED when he was not able to control the sugar with insulin at home. Benjamin Hodge had not taking his insulin for 2 days, explaining that he skipped it because he wasn't eating. When he checked his glucose earlier in the day, the glucometer read "high." He states that he give himself 7 units of Humalog and was able to obtain a reading in the 500s. He denies any recent fevers, chills, cough, chest pains, palpitations, headaches, nausea, vomiting, or diarrhea. States that he's been quite well aside from his chronic back pain that he describes as severe.  In ED, patient was found to be afebrile, saturating well on room air with bradycardia, but vitals otherwise stable. Serum glucose returned elevated in the 700s with the electrolyte derangements including hyponatremia, hyperkalemia, and a AKI. Patient was acidotic on the blood gas and ketones are noted in both serum and urine. He was bolused normal saline and started on glucose stabilizer. He remained stable in the emergency  department and was admitted for further evaluation and management of DKA.  Hospital Course:  Insulin gtt and fluids continued. Transitioned to SQ insulin once anion gap normalized. Discussed compliance extensively with patient. He worked with PT. Family concerned about noncompliance and living alone. Walked over 200 feet with PT, does not meet criteria for SNF. Arranged home health and SW consulted to assist with ALF if patient and family interested.    Procedures:  none  Consultations:  PT, social work  Discharge Exam: Filed Vitals:   09/14/15 2057 09/15/15 0425  BP: 122/53 160/60  Pulse: 59 58  Temp: 97.7 F (36.5 C)   Resp: 19 18    General: a and o, cooperative. Cardiovascular: RRR Respiratory: cTA  Discharge Instructions   Discharge Instructions    Activity as tolerated - No restrictions    Complete by:  As directed      Diet - low sodium heart healthy    Complete by:  As directed      Diet Carb Modified    Complete by:  As directed           Current Discharge Medication List    CONTINUE these medications which have NOT CHANGED   Details  aspirin 81 MG tablet Take 81 mg by mouth at bedtime.     clopidogrel (PLAVIX) 75 MG tablet TAKE 1 TABLET BY MOUTH DAILY WITH BREAKFAST Qty: 30 tablet, Refills: 3    escitalopram (LEXAPRO) 20 MG tablet TAKE ONE TABLET BY MOUTH ONE TIME DAILY Qty: 30 tablet, Refills: 5    HYDROcodone-acetaminophen (NORCO/VICODIN) 5-325 MG tablet Take 1 tablet by mouth 5 (five) times daily. Refills: 0  Insulin Glargine (TOUJEO SOLOSTAR) 300 UNIT/ML SOPN Inject 14 Units into the skin at bedtime. Qty: 1 pen, Refills: 0    insulin lispro (HUMALOG) 100 UNIT/ML KiwkPen Inject 1-6 Units into the skin daily as needed (100-199 = 1 units, 200-299 = 2 units, 300-399 = 3 units, 400-499 = 4 units, 500-599 = 5 units, 600+ = 6 units).     levETIRAcetam (KEPPRA) 750 MG tablet Take 1/2 tablet in morning. Take whole tablet at night. Qty: 60 tablet,  Refills: 3   Associated Diagnoses: Absence attack (HCC); Partial symptomatic epilepsy with complex partial seizures, not intractable, without status epilepticus (HCC); Dementia arising in the senium and presenium    levothyroxine (SYNTHROID, LEVOTHROID) 75 MCG tablet Take 1 tablet (75 mcg total) by mouth daily. Qty: 30 tablet, Refills: 5    metoprolol tartrate (LOPRESSOR) 25 MG tablet One twice daily to control BP Qty: 60 tablet, Refills: 5   Associated Diagnoses: Essential hypertension    pantoprazole (PROTONIX) 40 MG tablet TAKE ONE TABLET BY MOUTH ONE TIME DAILY Qty: 30 tablet, Refills: 5    pravastatin (PRAVACHOL) 20 MG tablet Take 1 tablet (20 mg total) by mouth every evening. NEEDS APPOINTMENT FOR FUTURE REFILLS OR 90-DAY SUPPLY Qty: 30 tablet, Refills: 0    pregabalin (LYRICA) 50 MG capsule Take one capsule by mouth in the morning and two capsules by mouth in the evening for pains. Qty: 90 capsule, Refills: 5    saccharomyces boulardii (FLORASTOR) 250 MG capsule Take 1 capsule (250 mg total) by mouth 2 (two) times daily. Qty: 60 capsule, Refills: 0    vitamin B-12 (CYANOCOBALAMIN) 1000 MCG tablet Take 1,000 mcg by mouth daily.    feeding supplement, GLUCERNA SHAKE, (GLUCERNA SHAKE) LIQD Take 237 mLs by mouth 3 (three) times daily between meals. Qty: 90 Can, Refills: 0    Multiple Vitamin (MULTIVITAMIN WITH MINERALS) TABS tablet Take 1 tablet by mouth daily. Qty: 30 tablet, Refills: 3    ONE TOUCH ULTRA TEST test strip TEST 8 TIMES PER DAY FOR 30 DAYS Qty: 250 each, Refills: 4       Allergies  Allergen Reactions  . Ace Inhibitors Other (See Comments)    dizzy  . Cymbalta [Duloxetine Hcl] Other (See Comments)    dizzy  . Lipitor [Atorvastatin] Other (See Comments)    Caused pain all over body.  . Bee Venom Swelling  . Glucerna [Nutritional Supplements] Diarrhea  . Angiotensin Receptor Blockers Other (See Comments)    unknown   Follow-up Information    Follow up  with GREEN, Lenon Curt, MD.   Specialty:  Internal Medicine   Contact information:   [address] [place] [address] [phone]        The results of significant diagnostics from this hospitalization (including imaging, microbiology, ancillary and laboratory) are listed below for reference.    Significant Diagnostic Studies: Portable Chest X-ray (1 View)  09/14/2015  CLINICAL DATA:  Diabetic ketoacidosis, acute onset. Patient has not eaten or had insulin in 2 days. Initial encounter. EXAM: PORTABLE CHEST 1 VIEW COMPARISON:  Chest radiograph performed 09/06/2015 FINDINGS: The lungs are well-aerated. There is elevation of the left hemidiaphragm. There is no evidence of focal opacification, pleural effusion or pneumothorax. The cardiomediastinal silhouette is within normal limits. No acute osseous abnormalities are seen. IMPRESSION: Elevation of the left hemidiaphragm.  Lungs remain grossly clear. Electronically Signed   By: Roanna Raider M.D.   On: 09/14/2015 00:47   Dg Chest [facility]  09/07/2015  CLINICAL DATA:  Acute onset of cough.  Initial encounter. EXAM: PORTABLE CHEST 1 VIEW COMPARISON:  Chest radiograph performed 03/10/2014 FINDINGS: The lungs are well-aerated. There is mild elevation of the left hemidiaphragm. Minimal bilateral atelectasis is noted. There is no evidence of pleural effusion or pneumothorax. The cardiomediastinal silhouette is within normal limits. No acute osseous abnormalities are seen. IMPRESSION: Mild elevation of the left hemidiaphragm. Minimal bilateral atelectasis noted. Electronically Signed   By: Roanna Raider M.D.   On: 09/07/2015 00:40    Microbiology: Recent Results (from the past 240 hour(s))  Culture, Urine     Status: None   Collection Time: 09/06/15  9:54 PM  Result Value Ref Range Status   Specimen Description URINE, RANDOM  Final   Special Requests NONE  Final   Culture NO GROWTH 1 DAY  Final   Report Status 09/08/2015 FINAL  Final   MRSA PCR Screening     Status: None   Collection Time: 09/06/15 10:06 PM  Result Value Ref Range Status   MRSA by PCR NEGATIVE NEGATIVE Final    Comment:        The GeneXpert MRSA Assay (FDA approved for NASAL specimens only), is one component of a comprehensive MRSA colonization surveillance program. It is not intended to diagnose MRSA infection nor to guide or monitor treatment for MRSA infections.      Labs: Basic Metabolic Panel:  Recent Labs Lab 09/13/15 2028 09/14/15 0030 09/14/15 0505 09/14/15 1809 09/15/15 0344  NA 129* 132* 139  --  140  K 5.4* 4.5 3.6  --  3.6  CL 87* 95* 101  --  105  CO2 --  25  GLUCOSE 738* 585* 75 569* 58*  BUN 30* 30* 27*  --  23*  CREATININE 1.94* 1.72* 1.55*  --  1.29*  CALCIUM 9.7 9.0 9.4  --  9.0  MG  --  1.8  --   --   --   PHOS  --  3.3  --   --   --    Liver Function Tests: No results for input(s): AST, ALT, ALKPHOS, BILITOT, PROT, ALBUMIN in the last 168 hours. No results for input(s): LIPASE, AMYLASE in the last 168 hours. No results for input(s): AMMONIA in the last 168 hours. CBC:  Recent Labs Lab 09/13/15 2028  WBC 7.5  HGB 10.0*  HCT 31.3*  MCV 96.0  PLT 295   Cardiac Enzymes:  Recent Labs Lab 09/14/15 0030 09/14/15 0617  TROPONINI 0.03 0.03   BNP: BNP (last 3 results) No results for input(s): BNP in the last 8760 hours.  ProBNP (last 3 results) No results for input(s): PROBNP in the last 8760 hours.  CBG:  Recent Labs Lab 09/14/15 0936 09/14/15 1211 09/14/15 1652 09/14/15 2054 09/15/15 0634  GLUCAP 124* 309* 414* 296* 106*       Signed:  Christiane Ha MD.  [facility] 09/15/2015, 8:48 AM

## 2015-09-15 NOTE — Progress Notes (Signed)
Patient's blood sugar 438.  Patient had already started eating dinner.  Dr. Lendell Caprice notified; new order to give one time dose of 11 units in place of SSI.  Will continue to monitor.

## 2015-09-15 NOTE — Progress Notes (Signed)
Patient has a discharge order to home; patient's son, Jeffrey Wurdeman, called the unit requesting that his father be placed in a facility because he does not feel that his father can adequately take care of himself.  Per son, patient is noncompliant with medication, falls frequently, and has been found unresponsive.  Dr. Lendell Caprice notified of concerns; MD ordered a PT evaluation prior to discharge.  MD states patient does not qualify for placement and has spoken with the patient about taking his medication properly.  Will continue to monitor.

## 2015-09-15 NOTE — Progress Notes (Signed)
LCSW called and spoke with patient son: Cleone Slim [phone] and his wife regarding patient. [name] reports he is power of attorney and plan will be to bring patient home to his house.    Son and wife report batters regarding care for patient along with emotional frustration of patient not being able to be placed. LCSW allowed time for both to process, but also explained procedure, recommendation and clinical need for a skilled reason to enter into SNF.  At this time patient is not meeting criteria. Patient already has Home health involved with [facility] and son is willing to call [facility] agency to have someone come back out and assist patient with PT/OT needs.  Reviewed case with MD and RN. Plan is for son to come and pick patient up and take patient back to son's home in effort to intervene with patient not adequately and safely taking care of himself.  Deretha Emory, MSW Clinical Social Work: Emergency Room [phone]

## 2015-09-15 NOTE — Progress Notes (Signed)
Dr. Lendell Caprice advises to discharge patient per order.

## 2015-09-15 NOTE — Care Management Note (Signed)
Case Management Note  Patient Details  Name: Benjamin Hodge MRN: [id number] Date of Birth: May 08, 1941  Subjective/Objective:                  "Sugars out of control"  Action/Plan: CM spoke with patient. He lives at home with his granddaughter. He requests [facility] for [facility] and SW. States he knows he needs to be homebound to receive home health services. 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 Clydie Braun at [facility] notified of the referral and discharge date of today.   Expected Discharge Date:   09/15/15               Expected Discharge Plan:  Home w Home Health Services  In-House Referral:     Discharge planning Services  CM Consult  Post Acute Care Choice:  Home Health Choice offered to:  Patient  DME Arranged:  [name] DME Agency:  NA  HH Arranged:  RN, Social Work [employer] Agency:  [facility]  Status of Service:  Completed, signed off  Medicare Important Message Given:    Date Medicare IM Given:    Medicare IM give by:    Date Additional Medicare IM Given:    Additional Medicare Important Message give by:     If discussed at Long Length of Stay Meetings, dates discussed:    Additional Comments:  Antony Haste, RN 09/15/2015, 3:21 PM

## 2015-09-15 NOTE — Progress Notes (Signed)
Notified Dr. Lendell Caprice that patient's blood pressure was elevated this morning.  MD aware; no new orders received.  Will continue to monitor.

## 2015-09-16 LAB — HEMOGLOBIN A1C
HEMOGLOBIN A1C: 12.4 % — AB (ref 4.8–5.6)
MEAN PLASMA GLUCOSE: 309 mg/dL

## 2015-09-19 NOTE — Discharge Summary (Signed)
Benjamin Ha, Hodge Physician Signed Internal Medicine Progress Notes 09/15/2015 8:48 AM    Expand All Collapse All   Physician Discharge Summary  Benjamin Hodge:[id number] DOB: Dec 24, 1940 DOA: 09/13/2015  PCP: Benjamin Relic, Hodge  Admit date: 09/13/2015 Discharge date: 09/15/2015  Time spent: greater than 30 minutes  Recommendations for Outpatient Follow-up:  1. Home with home RN, PT, social work   Discharge Diagnoses:  Principal Problem:  DKA (diabetic ketoacidoses) (HCC) Active Problems:  COPD (chronic obstructive pulmonary disease) (HCC)  Anemia- chronic  Diabetes mellitus type 1 with peripheral artery disease (HCC)  Partial symptomatic epilepsy with complex partial seizures, not intractable, without status epilepticus (HCC)   Discharge Condition: stable  Diet recommendation: carbohydrate modified  There were no vitals filed for this visit.  History of present illness:  75 y.o. male with PMH of insulin-dependent diabetes mellitus, chronic pain with narcotic dependency, CAD, COPD, and depression who presents to the ED when he was not able to control the sugar with insulin at home. Benjamin Hodge had not taking his insulin for 2 days, explaining that he skipped it because he wasn't eating. When he checked his glucose earlier in the day, the glucometer read "high." He states that he give himself 7 units of Humalog and was able to obtain a reading in the 500s. He denies any recent fevers, chills, cough, chest pains, palpitations, headaches, nausea, vomiting, or diarrhea. States that he's been quite well aside from his chronic back pain that he describes as severe.  In ED, patient was found to be afebrile, saturating well on room air with bradycardia, but vitals otherwise stable. Serum glucose returned elevated in the 700s with the electrolyte derangements including hyponatremia, hyperkalemia, and a AKI. Patient was acidotic on the blood gas and ketones are noted in both  serum and urine. He was bolused normal saline and started on glucose stabilizer. He remained stable in the emergency department and was admitted for further evaluation and management of DKA.  Hospital Course:  Insulin gtt and fluids continued. Transitioned to SQ insulin once anion gap normalized. Discussed compliance extensively with patient. He worked with PT. Family concerned about noncompliance and living alone. Walked over 200 feet with PT, does not meet criteria for SNF. Arranged home health and SW consulted to assist with ALF if patient and family interested.   Procedures:  none  Consultations:  PT, social work  Discharge Exam: Filed Vitals:   09/14/15 2057 09/15/15 0425  BP: 122/53 160/60  Pulse: 59 58  Temp: 97.7 F (36.5 C)   Resp: 19 18    General: a and o, cooperative. Cardiovascular: RRR Respiratory: cTA  Discharge Instructions   Discharge Instructions    Activity as tolerated - No restrictions  Complete by: As directed      Diet - low sodium heart healthy  Complete by: As directed      Diet Carb Modified  Complete by: As directed           Current Discharge Medication List    CONTINUE these medications which have NOT CHANGED   Details  aspirin 81 MG tablet Take 81 mg by mouth at bedtime.     clopidogrel (PLAVIX) 75 MG tablet TAKE 1 TABLET BY MOUTH DAILY WITH BREAKFAST Qty: 30 tablet, Refills: 3    escitalopram (LEXAPRO) 20 MG tablet TAKE ONE TABLET BY MOUTH ONE TIME DAILY Qty: 30 tablet, Refills: 5    HYDROcodone-acetaminophen (NORCO/VICODIN) 5-325 MG tablet Take 1 tablet by mouth  5 (five) times daily. Refills: 0    Insulin Glargine (TOUJEO SOLOSTAR) 300 UNIT/ML SOPN Inject 14 Units into the skin at bedtime. Qty: 1 pen, Refills: 0    insulin lispro (HUMALOG) 100 UNIT/ML KiwkPen Inject 1-6 Units into the skin daily as needed (100-199 = 1 units, 200-299 = 2 units, 300-399 = 3  units, 400-499 = 4 units, 500-599 = 5 units, 600+ = 6 units).     levETIRAcetam (KEPPRA) 750 MG tablet Take 1/2 tablet in morning. Take whole tablet at night. Qty: 60 tablet, Refills: 3   Associated Diagnoses: Absence attack (HCC); Partial symptomatic epilepsy with complex partial seizures, not intractable, without status epilepticus (HCC); Dementia arising in the senium and presenium    levothyroxine (SYNTHROID, LEVOTHROID) 75 MCG tablet Take 1 tablet (75 mcg total) by mouth daily. Qty: 30 tablet, Refills: 5    metoprolol tartrate (LOPRESSOR) 25 MG tablet One twice daily to control BP Qty: 60 tablet, Refills: 5   Associated Diagnoses: Essential hypertension    pantoprazole (PROTONIX) 40 MG tablet TAKE ONE TABLET BY MOUTH ONE TIME DAILY Qty: 30 tablet, Refills: 5    pravastatin (PRAVACHOL) 20 MG tablet Take 1 tablet (20 mg total) by mouth every evening. NEEDS APPOINTMENT FOR FUTURE REFILLS OR 90-DAY SUPPLY Qty: 30 tablet, Refills: 0    pregabalin (LYRICA) 50 MG capsule Take one capsule by mouth in the morning and two capsules by mouth in the evening for pains. Qty: 90 capsule, Refills: 5    saccharomyces boulardii (FLORASTOR) 250 MG capsule Take 1 capsule (250 mg total) by mouth 2 (two) times daily. Qty: 60 capsule, Refills: 0    vitamin B-12 (CYANOCOBALAMIN) 1000 MCG tablet Take 1,000 mcg by mouth daily.    feeding supplement, GLUCERNA SHAKE, (GLUCERNA SHAKE) LIQD Take 237 mLs by mouth 3 (three) times daily between meals. Qty: 90 Can, Refills: 0    Multiple Vitamin (MULTIVITAMIN WITH MINERALS) TABS tablet Take 1 tablet by mouth daily. Qty: 30 tablet, Refills: 3    ONE TOUCH ULTRA TEST test strip TEST 8 TIMES PER DAY FOR 30 DAYS Qty: 250 each, Refills: 4       Allergies  Allergen Reactions  . Ace Inhibitors Other (See Comments)    dizzy  . Cymbalta [Duloxetine Hcl] Other (See Comments)    dizzy  . Lipitor  [Atorvastatin] Other (See Comments)    Caused pain all over body.  . Bee Venom Swelling  . Glucerna [Nutritional Supplements] Diarrhea  . Angiotensin Receptor Blockers Other (See Comments)    unknown   Follow-up Information    Follow up with GREEN, Lenon Curt, Hodge.   Specialty: Internal Medicine   Contact information:   [address] [place] [address] [phone]         The results of significant diagnostics from this hospitalization (including imaging, microbiology, ancillary and laboratory) are listed below for reference.    Significant Diagnostic Studies:  Imaging Results    Portable Chest X-ray (1 View)  09/14/2015 CLINICAL DATA: Diabetic ketoacidosis, acute onset. Patient has not eaten or had insulin in 2 days. Initial encounter. EXAM: PORTABLE CHEST 1 VIEW COMPARISON: Chest radiograph performed 09/06/2015 FINDINGS: The lungs are well-aerated. There is elevation of the left hemidiaphragm. There is no evidence of focal opacification, pleural effusion or pneumothorax. The cardiomediastinal silhouette is within normal limits. No acute osseous abnormalities are seen. IMPRESSION: Elevation of the left hemidiaphragm. Lungs remain grossly clear. Electronically Signed By: Roanna Raider M.D. On: 09/14/2015 00:47  Dg Chest Port 1 View  09/07/2015 CLINICAL DATA: Acute onset of cough. Initial encounter. EXAM: PORTABLE CHEST 1 VIEW COMPARISON: Chest radiograph performed 03/10/2014 FINDINGS: The lungs are well-aerated. There is mild elevation of the left hemidiaphragm. Minimal bilateral atelectasis is noted. There is no evidence of pleural effusion or pneumothorax. The cardiomediastinal silhouette is within normal limits. No acute osseous abnormalities are seen. IMPRESSION: Mild elevation of the left hemidiaphragm. Minimal bilateral atelectasis noted. Electronically Signed By: Roanna Raider M.D. On: 09/07/2015 00:40      Microbiology: Recent Results (from the past 240 hour(s))  Culture, Urine Status: None   Collection Time: 09/06/15 9:54 PM  Result Value Ref Range Status   Specimen Description URINE, RANDOM  Final   Special Requests NONE  Final   Culture NO GROWTH 1 DAY  Final   Report Status 09/08/2015 FINAL  Final  MRSA PCR Screening Status: None   Collection Time: 09/06/15 10:06 PM  Result Value Ref Range Status   MRSA by PCR NEGATIVE NEGATIVE Final    Comment:   The GeneXpert MRSA Assay (FDA approved for NASAL specimens only), is one component of a comprehensive MRSA colonization surveillance program. It is not intended to diagnose MRSA infection nor to guide or monitor treatment for MRSA infections.      Labs: Basic Metabolic Panel:  Last Labs      Recent Labs Lab 09/13/15 2028 09/14/15 0030 09/14/15 0505 09/14/15 1809 09/15/15 0344  NA 129* 132* 139 --  140  K 5.4* 4.5 3.6 --  3.6  CL 87* 95* 101 --  105  CO2 22 23 28  --  25  GLUCOSE 738* 585* 75 569* 58*  BUN 30* 30* 27* --  23*  CREATININE 1.94* 1.72* 1.55* --  1.29*  CALCIUM 9.7 9.0 9.4 --  9.0  MG --  1.8 --  --  --   PHOS --  3.3 --  --  --      Liver Function Tests:  Last Labs     No results for input(s): AST, ALT, ALKPHOS, BILITOT, PROT, ALBUMIN in the last 168 hours.    Last Labs     No results for input(s): LIPASE, AMYLASE in the last 168 hours.    Last Labs     No results for input(s): AMMONIA in the last 168 hours.   CBC:  Last Labs      Recent Labs Lab 09/13/15 2028  WBC 7.5  HGB 10.0*  HCT 31.3*  MCV 96.0  PLT 295     Cardiac Enzymes:  Last Labs      Recent Labs Lab 09/14/15 0030 09/14/15 0617  TROPONINI 0.03 0.03     BNP: BNP (last 3 results)  Recent Labs (within last 365 days)    No results for input(s): BNP in the  last 8760 hours.    ProBNP (last 3 results)  Recent Labs (within last 365 days)    No results for input(s): PROBNP in the last 8760 hours.    CBG:  Last Labs      Recent Labs Lab 09/14/15 0936 09/14/15 1211 09/14/15 1652 09/14/15 2054 09/15/15 0634  GLUCAP 124* 309* 414* 296* 106*         Signed:  Christiane Ha Hodge.  [facility] 09/15/2015, 8:48 AM

## 2015-09-23 ENCOUNTER — Other Ambulatory Visit: Payer: Self-pay | Admitting: *Deleted

## 2015-09-23 DIAGNOSIS — I739 Peripheral vascular disease, unspecified: Secondary | ICD-10-CM | POA: Diagnosis not present

## 2015-09-23 DIAGNOSIS — I10 Essential (primary) hypertension: Secondary | ICD-10-CM

## 2015-09-23 DIAGNOSIS — E1065 Type 1 diabetes mellitus with hyperglycemia: Secondary | ICD-10-CM | POA: Diagnosis not present

## 2015-09-23 DIAGNOSIS — Z794 Long term (current) use of insulin: Secondary | ICD-10-CM | POA: Diagnosis not present

## 2015-09-23 DIAGNOSIS — E1042 Type 1 diabetes mellitus with diabetic polyneuropathy: Secondary | ICD-10-CM | POA: Diagnosis not present

## 2015-09-23 MED ORDER — METOPROLOL TARTRATE 25 MG PO TABS: ORAL_TABLET | ORAL | Status: DC

## 2015-09-23 MED ORDER — HYDROCODONE-ACETAMINOPHEN 5-325 MG PO TABS: 1.0000 | ORAL_TABLET | Freq: Every day | ORAL | Status: DC

## 2015-09-23 NOTE — Telephone Encounter (Signed)
[facility] [place]

## 2015-09-23 NOTE — Telephone Encounter (Signed)
Noted  

## 2015-09-23 NOTE — Telephone Encounter (Signed)
Patient requested and will pick up 

## 2015-09-25 ENCOUNTER — Telehealth: Payer: Self-pay | Admitting: Neurology

## 2015-09-25 NOTE — Telephone Encounter (Signed)
Spoke with pt's daughter in law, per DPR, for over 15 minutes.  At last visit with Dr. Vickey Huger, pt promised Dr. Vickey Huger that he would not drive his car because of his seizures, at least until April, but not before Dr. Vickey Huger cleared him to drive.  Pt's daughter in law reports that pt has been driving his car. Pt's family took his car keys because of this. Pt now has become almost violent with the family, telling them that he will get a replacement key to his car so he will drive.  Pt's daughter in law also reports that pt has been hoarding hydrocodone and she believes that he is overdosing on his hydrocodone. He has been hospitalized twice in the past couple weeks, and his family found him smuggling hydrocodone pills into the hospital to take during his hospitalization. Pt's daughter in law believes that the pt's hypersomnolence is related to the overuse of the hydrocodone.  Pt saw an opthamologist yesterday to address his vision. Opthamologist advised pt not to drive because of his poor vision, hydrocodone dependence/overuse, and pt's seizures. Pt reported to this doctor that he does not have a history of seizures and that he would have remembered having them if he did indeed have seizures. Pt's daughter in law reports that the pt almost became violent with this doctor when the doctor advised him not to drive.  Pt's daughter in law is extremely concerned about pt's driving and potentially injuring himself or others. I offered another follow up appt with Dr. Vickey Huger to see if a face to face encounter with Dr. Vickey Huger could convince pt that he did indeed have seizures and he MUST NOT DRIVE. Pt's daughter in law reports that she does not think the pt will be amenable to this, because he blames Dr. Vickey Huger for making him unable to drive. Pt's daughter in law thinks that we must contact the DMV to get his license revoked. I advised her that I would send this message to Dr Vickey Huger and get her advice.  Pt's daughter in law verbalized understanding.

## 2015-09-25 NOTE — Telephone Encounter (Signed)
I agree that the patient needs to be announced to the [facility] for his noncompliance with medical management and his unsafe habits of driving within months of having a seizure. The hydrocodone abuse is a very helpful observation for me and it indeed explains his hypersomnolence.  I will be happy to assist with the DMV recording , if needed.  I understand that Mr. Hutchinson is not amendable to see me again and indeed him this may not be a bad option. If he is noncompliant with medical regimen I do not want to continue being his neurologist.    C. Nawal Burling.

## 2015-09-25 NOTE — Telephone Encounter (Signed)
Pt's daughter-in-law called said she has some concerns that are going on with the pt that she needs to discuss. She did not want to go into detail

## 2015-09-26 ENCOUNTER — Telehealth: Payer: Self-pay | Admitting: *Deleted

## 2015-09-26 NOTE — Telephone Encounter (Signed)
Benjamin Hodge returned Benjamin Hodge's phone call

## 2015-09-26 NOTE — Telephone Encounter (Signed)
Called to discuss with Ronney Lion, daughter in law, per DPR, that Dr. Vickey Huger agrees that pt needs to be reported to [facility] for his noncompliance with medical management and his unsafe driving within months of having a seizure.   Left a message asking her to call me back.

## 2015-09-26 NOTE — Telephone Encounter (Signed)
Patient's daughter in law, Trula Ore called with concerns that are going on with the patient. He is scheduled to see Dr. Chilton Si 10/16/15. In hospital twice 09/06/15-09/08/15 and then again 09/13/15-09/15/15 and both he was in for super high blood sugars. The second time he was admitted he was demanding that he get all of his medications. He was concerned with taking his Hydrocodone with him and filled his pockets with the pills to take.  He is hoarding Hydrocodone, daughter in law has found 3 full bottles filled with Hydrocodone. Daughter in law took them while patient was in hospital and not sure what to do with them. She states that patient takes 3 pills sometimes at once.  He is sleeping a majority of the day and only awake about 8 hours and takes about 5 Hydrocodone's throughout that time period. Also reports patient is drinking alcohol while taking pain medication.   Saw the eye dr this week and could barely keep his eyes open and the eye dr is very concerned he is taking way too much pain medication. Has advanced cataracts and has put off eye surgery due to medical concerns. Vision is 20/170. Opthamologist suggested patient not to drive because of his poor vision and overuse of Hydrocodone and seizures and patient got upset. Patient had told Dr. Vickey Huger, Neurologist he would not drive his car because of his seizures, at least until April due to the seizures but patient has been driving. Now Dr. Vickey Huger has been informed of patient's driving and is calling DMV due to his non compliance. Family has taken patient's care keys and patient is very angry with this.   Patient has no energy and doesn't want to get out of bed, then stops his medications and sleeps all day long. Has trouble eating regular meals. Family has to make him get out of bed and eat, if they don't he won't get up and eat.  The hospital ordered home health for the patient on the 2nd discharge and the patient had 1 nurse visit but spent the whole  time discussing with the nurse that he had a job which he lost.     Daughter in [place] is Highly discouraging Dr. Chilton Si allowing patient to have anymore Hydrocodone.  Family is concerned with his safety and well being and needs some direction, they don't  know where to go from here. Please advise Patient does have an appointment on 10/16/2015.

## 2015-09-26 NOTE — Telephone Encounter (Signed)
I will see him as planned 10/16/2015. Please mark his chart that he should not have any further refills of hydrocodone until he has been seen by me.

## 2015-09-26 NOTE — Telephone Encounter (Signed)
Spoke to [employer], daughter in law, per DPR. She is asking that we please fax a letter to the [facility] asking them to re-evaluate pt's ability to drive and possibly revoke license. I advised her that Dr. Vickey Huger agrees that pt needs to be reported to the [facility] for his noncompliance with driving.  Will fax a letter to [facility].

## 2015-09-27 ENCOUNTER — Emergency Department (HOSPITAL_COMMUNITY): Payer: Medicare Other

## 2015-09-27 ENCOUNTER — Inpatient Hospital Stay (HOSPITAL_COMMUNITY)
Admission: EM | Admit: 2015-09-27 | Discharge: 2015-10-03 | DRG: 637 | Disposition: A | Discharge status: Skilled Nursing Facility | Payer: Medicare Other | Attending: Internal Medicine | Admitting: Internal Medicine

## 2015-09-27 ENCOUNTER — Encounter (HOSPITAL_COMMUNITY): Payer: Self-pay | Admitting: *Deleted

## 2015-09-27 DIAGNOSIS — Z87891 Personal history of nicotine dependence: Secondary | ICD-10-CM

## 2015-09-27 DIAGNOSIS — R978 Other abnormal tumor markers: Secondary | ICD-10-CM | POA: Diagnosis present

## 2015-09-27 DIAGNOSIS — G40909 Epilepsy, unspecified, not intractable, without status epilepticus: Secondary | ICD-10-CM | POA: Diagnosis present

## 2015-09-27 DIAGNOSIS — E109 Type 1 diabetes mellitus without complications: Secondary | ICD-10-CM | POA: Diagnosis not present

## 2015-09-27 DIAGNOSIS — E860 Dehydration: Secondary | ICD-10-CM

## 2015-09-27 DIAGNOSIS — D638 Anemia in other chronic diseases classified elsewhere: Secondary | ICD-10-CM | POA: Diagnosis not present

## 2015-09-27 DIAGNOSIS — E1101 Type 2 diabetes mellitus with hyperosmolarity with coma: Secondary | ICD-10-CM | POA: Diagnosis not present

## 2015-09-27 DIAGNOSIS — D649 Anemia, unspecified: Secondary | ICD-10-CM | POA: Diagnosis present

## 2015-09-27 DIAGNOSIS — E291 Testicular hypofunction: Secondary | ICD-10-CM | POA: Diagnosis not present

## 2015-09-27 DIAGNOSIS — Z7982 Long term (current) use of aspirin: Secondary | ICD-10-CM | POA: Diagnosis not present

## 2015-09-27 DIAGNOSIS — E10649 Type 1 diabetes mellitus with hypoglycemia without coma: Secondary | ICD-10-CM | POA: Diagnosis not present

## 2015-09-27 DIAGNOSIS — E1022 Type 1 diabetes mellitus with diabetic chronic kidney disease: Secondary | ICD-10-CM | POA: Diagnosis present

## 2015-09-27 DIAGNOSIS — E039 Hypothyroidism, unspecified: Secondary | ICD-10-CM | POA: Diagnosis not present

## 2015-09-27 DIAGNOSIS — G9341 Metabolic encephalopathy: Secondary | ICD-10-CM | POA: Diagnosis not present

## 2015-09-27 DIAGNOSIS — F1120 Opioid dependence, uncomplicated: Secondary | ICD-10-CM | POA: Diagnosis present

## 2015-09-27 DIAGNOSIS — M6281 Muscle weakness (generalized): Secondary | ICD-10-CM | POA: Diagnosis not present

## 2015-09-27 DIAGNOSIS — E1140 Type 2 diabetes mellitus with diabetic neuropathy, unspecified: Secondary | ICD-10-CM | POA: Diagnosis not present

## 2015-09-27 DIAGNOSIS — R195 Other fecal abnormalities: Secondary | ICD-10-CM | POA: Diagnosis present

## 2015-09-27 DIAGNOSIS — R410 Disorientation, unspecified: Secondary | ICD-10-CM | POA: Diagnosis not present

## 2015-09-27 DIAGNOSIS — R2681 Unsteadiness on feet: Secondary | ICD-10-CM | POA: Diagnosis not present

## 2015-09-27 DIAGNOSIS — Z9861 Coronary angioplasty status: Secondary | ICD-10-CM

## 2015-09-27 DIAGNOSIS — R634 Abnormal weight loss: Secondary | ICD-10-CM

## 2015-09-27 DIAGNOSIS — M545 Low back pain, unspecified: Secondary | ICD-10-CM | POA: Diagnosis present

## 2015-09-27 DIAGNOSIS — E785 Hyperlipidemia, unspecified: Secondary | ICD-10-CM | POA: Diagnosis not present

## 2015-09-27 DIAGNOSIS — R970 Elevated carcinoembryonic antigen [CEA]: Secondary | ICD-10-CM | POA: Diagnosis not present

## 2015-09-27 DIAGNOSIS — R404 Transient alteration of awareness: Secondary | ICD-10-CM | POA: Diagnosis not present

## 2015-09-27 DIAGNOSIS — M4806 Spinal stenosis, lumbar region: Secondary | ICD-10-CM | POA: Diagnosis not present

## 2015-09-27 DIAGNOSIS — D510 Vitamin B12 deficiency anemia due to intrinsic factor deficiency: Secondary | ICD-10-CM | POA: Diagnosis not present

## 2015-09-27 DIAGNOSIS — Z7902 Long term (current) use of antithrombotics/antiplatelets: Secondary | ICD-10-CM

## 2015-09-27 DIAGNOSIS — I252 Old myocardial infarction: Secondary | ICD-10-CM

## 2015-09-27 DIAGNOSIS — I2510 Atherosclerotic heart disease of native coronary artery without angina pectoris: Secondary | ICD-10-CM | POA: Diagnosis not present

## 2015-09-27 DIAGNOSIS — I129 Hypertensive chronic kidney disease with stage 1 through stage 4 chronic kidney disease, or unspecified chronic kidney disease: Secondary | ICD-10-CM | POA: Diagnosis not present

## 2015-09-27 DIAGNOSIS — E1065 Type 1 diabetes mellitus with hyperglycemia: Principal | ICD-10-CM | POA: Diagnosis present

## 2015-09-27 DIAGNOSIS — I10 Essential (primary) hypertension: Secondary | ICD-10-CM

## 2015-09-27 DIAGNOSIS — N182 Chronic kidney disease, stage 2 (mild): Secondary | ICD-10-CM | POA: Diagnosis not present

## 2015-09-27 DIAGNOSIS — N2889 Other specified disorders of kidney and ureter: Secondary | ICD-10-CM | POA: Diagnosis not present

## 2015-09-27 DIAGNOSIS — Z794 Long term (current) use of insulin: Secondary | ICD-10-CM | POA: Diagnosis not present

## 2015-09-27 DIAGNOSIS — E1042 Type 1 diabetes mellitus with diabetic polyneuropathy: Secondary | ICD-10-CM | POA: Diagnosis not present

## 2015-09-27 DIAGNOSIS — E538 Deficiency of other specified B group vitamins: Secondary | ICD-10-CM | POA: Diagnosis present

## 2015-09-27 DIAGNOSIS — E1021 Type 1 diabetes mellitus with diabetic nephropathy: Secondary | ICD-10-CM | POA: Diagnosis not present

## 2015-09-27 DIAGNOSIS — D631 Anemia in chronic kidney disease: Secondary | ICD-10-CM | POA: Diagnosis not present

## 2015-09-27 DIAGNOSIS — E1165 Type 2 diabetes mellitus with hyperglycemia: Secondary | ICD-10-CM | POA: Diagnosis not present

## 2015-09-27 DIAGNOSIS — J441 Chronic obstructive pulmonary disease with (acute) exacerbation: Secondary | ICD-10-CM | POA: Diagnosis not present

## 2015-09-27 DIAGNOSIS — E1051 Type 1 diabetes mellitus with diabetic peripheral angiopathy without gangrene: Secondary | ICD-10-CM | POA: Diagnosis present

## 2015-09-27 DIAGNOSIS — R7309 Other abnormal glucose: Secondary | ICD-10-CM | POA: Diagnosis not present

## 2015-09-27 DIAGNOSIS — R1310 Dysphagia, unspecified: Secondary | ICD-10-CM | POA: Diagnosis not present

## 2015-09-27 DIAGNOSIS — R55 Syncope and collapse: Secondary | ICD-10-CM | POA: Diagnosis not present

## 2015-09-27 DIAGNOSIS — J449 Chronic obstructive pulmonary disease, unspecified: Secondary | ICD-10-CM | POA: Diagnosis not present

## 2015-09-27 DIAGNOSIS — K219 Gastro-esophageal reflux disease without esophagitis: Secondary | ICD-10-CM | POA: Diagnosis present

## 2015-09-27 DIAGNOSIS — R739 Hyperglycemia, unspecified: Secondary | ICD-10-CM

## 2015-09-27 DIAGNOSIS — N179 Acute kidney failure, unspecified: Secondary | ICD-10-CM | POA: Diagnosis not present

## 2015-09-27 DIAGNOSIS — R627 Adult failure to thrive: Secondary | ICD-10-CM | POA: Diagnosis present

## 2015-09-27 DIAGNOSIS — E1043 Type 1 diabetes mellitus with diabetic autonomic (poly)neuropathy: Secondary | ICD-10-CM | POA: Diagnosis present

## 2015-09-27 DIAGNOSIS — R1314 Dysphagia, pharyngoesophageal phase: Secondary | ICD-10-CM | POA: Diagnosis not present

## 2015-09-27 DIAGNOSIS — N189 Chronic kidney disease, unspecified: Secondary | ICD-10-CM | POA: Diagnosis not present

## 2015-09-27 DIAGNOSIS — E871 Hypo-osmolality and hyponatremia: Secondary | ICD-10-CM | POA: Diagnosis present

## 2015-09-27 DIAGNOSIS — I6529 Occlusion and stenosis of unspecified carotid artery: Secondary | ICD-10-CM | POA: Diagnosis not present

## 2015-09-27 DIAGNOSIS — G458 Other transient cerebral ischemic attacks and related syndromes: Secondary | ICD-10-CM | POA: Diagnosis not present

## 2015-09-27 DIAGNOSIS — R935 Abnormal findings on diagnostic imaging of other abdominal regions, including retroperitoneum: Secondary | ICD-10-CM | POA: Diagnosis not present

## 2015-09-27 DIAGNOSIS — R4182 Altered mental status, unspecified: Secondary | ICD-10-CM | POA: Diagnosis not present

## 2015-09-27 DIAGNOSIS — R278 Other lack of coordination: Secondary | ICD-10-CM | POA: Diagnosis not present

## 2015-09-27 HISTORY — DX: Other specified disorders of kidney and ureter: N28.89

## 2015-09-27 HISTORY — DX: Testicular hypofunction: E29.1

## 2015-09-27 LAB — COMPREHENSIVE METABOLIC PANEL
ALBUMIN: 3.4 g/dL — AB (ref 3.5–5.0)
ALK PHOS: 77 U/L (ref 38–126)
ALT: 39 U/L (ref 17–63)
AST: 29 U/L (ref 15–41)
Anion gap: 13 (ref 5–15)
BILIRUBIN TOTAL: 1.1 mg/dL (ref 0.3–1.2)
BUN: 43 mg/dL — AB (ref 6–20)
CALCIUM: 8.7 mg/dL — AB (ref 8.9–10.3)
CO2: 20 mmol/L — ABNORMAL LOW (ref 22–32)
CREATININE: 1.43 mg/dL — AB (ref 0.61–1.24)
Chloride: 93 mmol/L — ABNORMAL LOW (ref 101–111)
GFR calc Af Amer: 55 mL/min — ABNORMAL LOW (ref >60)
GFR, EST NON AFRICAN AMERICAN: 47 mL/min — AB (ref >60)
GLUCOSE: 673 mg/dL — AB (ref 65–99)
POTASSIUM: 4.5 mmol/L (ref 3.5–5.1)
Sodium: 126 mmol/L — ABNORMAL LOW (ref 135–145)
TOTAL PROTEIN: 6.2 g/dL — AB (ref 6.5–8.1)

## 2015-09-27 LAB — URINE MICROSCOPIC-ADD ON
RBC / HPF: NONE SEEN RBC/hpf (ref 0–5)
Squamous Epithelial / LPF: NONE SEEN

## 2015-09-27 LAB — RAPID URINE DRUG SCREEN, HOSP PERFORMED
Amphetamines: NOT DETECTED
BENZODIAZEPINES: NOT DETECTED
Barbiturates: NOT DETECTED
Cocaine: NOT DETECTED
Opiates: NOT DETECTED
Tetrahydrocannabinol: NOT DETECTED

## 2015-09-27 LAB — URINALYSIS, ROUTINE W REFLEX MICROSCOPIC
BILIRUBIN URINE: NEGATIVE
Glucose, UA: >1000 mg/dL — AB
HGB URINE DIPSTICK: NEGATIVE
KETONES UR: 15 mg/dL — AB
Leukocytes, UA: NEGATIVE
Nitrite: NEGATIVE
Protein, ur: NEGATIVE mg/dL
SPECIFIC GRAVITY, URINE: 1.028 (ref 1.005–1.030)
pH: 5 (ref 5.0–8.0)

## 2015-09-27 LAB — CBC
HEMATOCRIT: 28.6 % — AB (ref 39.0–52.0)
Hemoglobin: 9.8 g/dL — ABNORMAL LOW (ref 13.0–17.0)
MCH: 31.4 pg (ref 26.0–34.0)
MCHC: 34.3 g/dL (ref 30.0–36.0)
MCV: 91.7 fL (ref 78.0–100.0)
PLATELETS: 217 10*3/uL (ref 150–400)
RBC: 3.12 MIL/uL — ABNORMAL LOW (ref 4.22–5.81)
RDW: 12.4 % (ref 11.5–15.5)
WBC: 7.4 10*3/uL (ref 4.0–10.5)

## 2015-09-27 LAB — BASIC METABOLIC PANEL
ANION GAP: 8 (ref 5–15)
BUN: 39 mg/dL — ABNORMAL HIGH (ref 6–20)
CALCIUM: 8.9 mg/dL (ref 8.9–10.3)
CO2: 25 mmol/L (ref 22–32)
CREATININE: 1.22 mg/dL (ref 0.61–1.24)
Chloride: 105 mmol/L (ref 101–111)
GFR, EST NON AFRICAN AMERICAN: 57 mL/min — AB (ref >60)
GLUCOSE: 82 mg/dL (ref 65–99)
Potassium: 4.2 mmol/L (ref 3.5–5.1)
Sodium: 138 mmol/L (ref 135–145)

## 2015-09-27 LAB — CBG MONITORING, ED
GLUCOSE-CAPILLARY: 260 mg/dL — AB (ref 65–99)
Glucose-Capillary: >600 mg/dL (ref 65–99)
Glucose-Capillary: >600 mg/dL (ref 65–99)

## 2015-09-27 MED ORDER — LEVETIRACETAM 750 MG PO TABS
750.0000 mg | ORAL_TABLET | Freq: Every day | ORAL | Status: DC
  Administered 2015-09-27 – 2015-10-02 (×6): 750 mg via ORAL
  Filled 2015-09-27 (×9): qty 1

## 2015-09-27 MED ORDER — POTASSIUM CHLORIDE IN NACL 20-0.9 MEQ/L-% IV SOLN
INTRAVENOUS | Status: DC
Start: 2015-09-27 — End: 2015-09-27

## 2015-09-27 MED ORDER — INSULIN GLARGINE 100 UNIT/ML ~~LOC~~ SOLN
11.0000 [IU] | Freq: Every day | SUBCUTANEOUS | Status: DC
  Administered 2015-09-27 – 2015-10-02 (×6): 11 [IU] via SUBCUTANEOUS
  Filled 2015-09-27 (×7): qty 0.11

## 2015-09-27 MED ORDER — SODIUM CHLORIDE 0.9 % IV BOLUS (SEPSIS)
250.0000 mL | Freq: Once | INTRAVENOUS | Status: AC
  Administered 2015-09-27: 250 mL via INTRAVENOUS

## 2015-09-27 MED ORDER — DEXTROSE-NACL 5-0.45 % IV SOLN
INTRAVENOUS | Status: DC
  Administered 2015-09-27: 21:00:00 via INTRAVENOUS

## 2015-09-27 MED ORDER — DEXTROSE 50 % IV SOLN
INTRAVENOUS | Status: AC
  Filled 2015-09-27: qty 50

## 2015-09-27 MED ORDER — INSULIN REGULAR HUMAN 100 UNIT/ML IJ SOLN
INTRAMUSCULAR | Status: DC
  Administered 2015-09-27: 5.4 [IU]/h via INTRAVENOUS
  Filled 2015-09-27: qty 2.5

## 2015-09-27 MED ORDER — ACETAMINOPHEN 325 MG PO TABS
650.0000 mg | ORAL_TABLET | Freq: Four times a day (QID) | ORAL | Status: DC | PRN
Start: 2015-09-27 — End: 2015-10-03

## 2015-09-27 MED ORDER — SACCHAROMYCES BOULARDII 250 MG PO CAPS
250.0000 mg | ORAL_CAPSULE | Freq: Two times a day (BID) | ORAL | Status: DC
  Administered 2015-09-27 – 2015-10-03 (×12): 250 mg via ORAL
  Filled 2015-09-27 (×12): qty 1

## 2015-09-27 MED ORDER — VITAMIN B-12 1000 MCG PO TABS
1000.0000 ug | ORAL_TABLET | Freq: Every day | ORAL | Status: DC
  Administered 2015-09-27 – 2015-10-03 (×7): 1000 ug via ORAL
  Filled 2015-09-27 (×7): qty 1

## 2015-09-27 MED ORDER — PREGABALIN 50 MG PO CAPS
50.0000 mg | ORAL_CAPSULE | Freq: Every day | ORAL | Status: DC
  Administered 2015-09-28 – 2015-10-03 (×6): 50 mg via ORAL
  Filled 2015-09-27 (×6): qty 1

## 2015-09-27 MED ORDER — ENOXAPARIN SODIUM 40 MG/0.4ML ~~LOC~~ SOLN
40.0000 mg | SUBCUTANEOUS | Status: DC
  Administered 2015-09-27 – 2015-09-30 (×4): 40 mg via SUBCUTANEOUS
  Filled 2015-09-27 (×4): qty 0.4

## 2015-09-27 MED ORDER — ALUM & MAG HYDROXIDE-SIMETH 200-200-20 MG/5ML PO SUSP: 30.0000 mL | Freq: Four times a day (QID) | ORAL | Status: DC | PRN

## 2015-09-27 MED ORDER — LEVOFLOXACIN IN D5W 750 MG/150ML IV SOLN
750.0000 mg | INTRAVENOUS | Status: DC
  Administered 2015-09-27 – 2015-09-30 (×2): 750 mg via INTRAVENOUS
  Filled 2015-09-27 (×2): qty 150

## 2015-09-27 MED ORDER — PREGABALIN 100 MG PO CAPS
100.0000 mg | ORAL_CAPSULE | Freq: Every day | ORAL | Status: DC
  Administered 2015-09-27 – 2015-10-02 (×6): 100 mg via ORAL
  Filled 2015-09-27 (×7): qty 1

## 2015-09-27 MED ORDER — LEVETIRACETAM 100 MG/ML PO SOLN
375.0000 mg | Freq: Every day | ORAL | Status: DC
  Administered 2015-09-28 – 2015-10-03 (×6): 380 mg via ORAL
  Filled 2015-09-27 (×8): qty 5

## 2015-09-27 MED ORDER — SODIUM CHLORIDE 0.9 % IV BOLUS (SEPSIS)
2000.0000 mL | Freq: Once | INTRAVENOUS | Status: AC
  Administered 2015-09-27: 2000 mL via INTRAVENOUS

## 2015-09-27 MED ORDER — MORPHINE SULFATE (PF) 2 MG/ML IV SOLN: 2.0000 mg | INTRAVENOUS | Status: DC | PRN

## 2015-09-27 MED ORDER — ONDANSETRON HCL 4 MG PO TABS: 4.0000 mg | ORAL_TABLET | Freq: Four times a day (QID) | ORAL | Status: DC | PRN

## 2015-09-27 MED ORDER — SODIUM CHLORIDE 0.9 % IJ SOLN
3.0000 mL | Freq: Two times a day (BID) | INTRAMUSCULAR | Status: DC
  Administered 2015-09-27 – 2015-10-02 (×7): 3 mL via INTRAVENOUS

## 2015-09-27 MED ORDER — ONDANSETRON HCL 4 MG/2ML IJ SOLN: 4.0000 mg | Freq: Four times a day (QID) | INTRAMUSCULAR | Status: DC | PRN

## 2015-09-27 MED ORDER — METOPROLOL TARTRATE 25 MG PO TABS
25.0000 mg | ORAL_TABLET | Freq: Two times a day (BID) | ORAL | Status: DC
  Administered 2015-09-28: 25 mg via ORAL
  Filled 2015-09-27: qty 1

## 2015-09-27 MED ORDER — INSULIN ASPART 100 UNIT/ML ~~LOC~~ SOLN: 0.0000 [IU] | Freq: Every day | SUBCUTANEOUS | Status: DC

## 2015-09-27 MED ORDER — ESCITALOPRAM OXALATE 10 MG PO TABS
20.0000 mg | ORAL_TABLET | Freq: Every day | ORAL | Status: DC
  Administered 2015-09-28 – 2015-10-03 (×7): 20 mg via ORAL
  Filled 2015-09-27 (×2): qty 2
  Filled 2015-09-27: qty 1
  Filled 2015-09-27: qty 2
  Filled 2015-09-27: qty 1
  Filled 2015-09-27 (×2): qty 2

## 2015-09-27 MED ORDER — ACETAMINOPHEN 650 MG RE SUPP: 650.0000 mg | Freq: Four times a day (QID) | RECTAL | Status: DC | PRN

## 2015-09-27 MED ORDER — INSULIN ASPART 100 UNIT/ML ~~LOC~~ SOLN
0.0000 [IU] | Freq: Three times a day (TID) | SUBCUTANEOUS | Status: DC
  Administered 2015-09-28: 5 [IU] via SUBCUTANEOUS
  Administered 2015-09-28: 8 [IU] via SUBCUTANEOUS

## 2015-09-27 MED ORDER — POLYETHYLENE GLYCOL 3350 17 G PO PACK: 17.0000 g | PACK | Freq: Every day | ORAL | Status: DC | PRN

## 2015-09-27 MED ORDER — ALBUTEROL SULFATE (2.5 MG/3ML) 0.083% IN NEBU: 2.5000 mg | INHALATION_SOLUTION | RESPIRATORY_TRACT | Status: DC | PRN

## 2015-09-27 MED ORDER — SODIUM CHLORIDE 0.9 % IV SOLN
INTRAVENOUS | Status: DC
  Administered 2015-09-27 – 2015-09-30 (×5): via INTRAVENOUS
  Administered 2015-10-01: 1000 mL via INTRAVENOUS
  Administered 2015-10-02: 19:00:00 via INTRAVENOUS
  Administered 2015-10-02: 1000 mL via INTRAVENOUS

## 2015-09-27 MED ORDER — CETYLPYRIDINIUM CHLORIDE 0.05 % MT LIQD
7.0000 mL | Freq: Two times a day (BID) | OROMUCOSAL | Status: DC
  Administered 2015-09-28 – 2015-10-02 (×10): 7 mL via OROMUCOSAL

## 2015-09-27 MED ORDER — LEVOTHYROXINE SODIUM 50 MCG PO TABS
75.0000 ug | ORAL_TABLET | Freq: Every day | ORAL | Status: DC
  Administered 2015-09-28 – 2015-10-03 (×6): 75 ug via ORAL
  Filled 2015-09-27 (×6): qty 1

## 2015-09-27 MED ORDER — DEXTROSE 50 % IV SOLN
1.0000 | Freq: Once | INTRAVENOUS | Status: AC
  Administered 2015-09-27: 50 mL via INTRAVENOUS

## 2015-09-27 MED ORDER — CLOPIDOGREL BISULFATE 75 MG PO TABS
75.0000 mg | ORAL_TABLET | Freq: Every day | ORAL | Status: DC
  Administered 2015-09-28 – 2015-10-03 (×5): 75 mg via ORAL
  Filled 2015-09-27 (×6): qty 1

## 2015-09-27 MED ORDER — HYDROCODONE-ACETAMINOPHEN 5-325 MG PO TABS
1.0000 | ORAL_TABLET | ORAL | Status: DC | PRN
  Administered 2015-09-28 (×3): 1 via ORAL
  Administered 2015-09-29: 2 via ORAL
  Administered 2015-09-30 – 2015-10-03 (×9): 1 via ORAL
  Filled 2015-09-27 (×8): qty 1
  Filled 2015-09-27: qty 2
  Filled 2015-09-27 (×4): qty 1

## 2015-09-27 MED ORDER — PANTOPRAZOLE SODIUM 40 MG PO TBEC
40.0000 mg | DELAYED_RELEASE_TABLET | Freq: Every day | ORAL | Status: DC
  Administered 2015-09-28 – 2015-10-03 (×6): 40 mg via ORAL
  Filled 2015-09-27 (×7): qty 1

## 2015-09-27 MED ORDER — PRAVASTATIN SODIUM 20 MG PO TABS
20.0000 mg | ORAL_TABLET | Freq: Every evening | ORAL | Status: DC
  Administered 2015-09-28 – 2015-10-02 (×5): 20 mg via ORAL
  Filled 2015-09-27 (×6): qty 1

## 2015-09-27 MED ORDER — ASPIRIN 81 MG PO CHEW
81.0000 mg | CHEWABLE_TABLET | Freq: Every day | ORAL | Status: DC
  Administered 2015-09-27 – 2015-10-02 (×6): 81 mg via ORAL
  Filled 2015-09-27 (×7): qty 1

## 2015-09-27 MED ORDER — PREGABALIN 50 MG PO CAPS: 50.0000 mg | ORAL_CAPSULE | Freq: Every day | ORAL | Status: DC
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
## 2015-09-27 NOTE — ED Notes (Signed)
Per EMS pt from home with c/o hyperglycemia and altered mental status, CBG >600 per family, was recently hospitalized for the same

## 2015-09-27 NOTE — Telephone Encounter (Signed)
Noted in FYI
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
## 2015-09-27 NOTE — H&P (Signed)
Patient Demographics  Benjamin Hodge, is a 75 y.o. male  MRN: [id number]   DOB - Oct 10, 1940  Admit Date - 09/27/2015  Outpatient Primary MD for the patient is GREEN, Lenon Curt, MD   Assessment Benjamin Hodge is a pleasant 75 year male with multiple medical problems, including Insulin dependent Diabetes Mellitus, poorly controlled HbA1C 12.4, COPD, Essential Htn, Hypothyroidism who comes in with reduced level of consciousness associated with high sugars, and he is found to have Acute Metabolic encephalopathy in setting of nonketotic hyperosmolar hyperglycemia with hyponatremia/acute kidney injury and narcotics. He also has acute exacerbation of COPD. Unfortunately, his history is rather limited as he remains disoriented and there is no family member at bedside. Patient has had multiple admissions in the last month or two and it is apparent that he is failing to cope at home. Today he is not sure if he forgot to take his insulin but he admits to increasing cough. Labs are significant for wbc 7400, bun/cr 43/1.43, sodium 126 glucose 673 mg/dl. UA does not suggest UTI. CXR does not show acute infiltrate.  He will be admitted to the Step Down Unit for IVF/glucose stabilizer/empiric antibiotics, and bronchodilators. He will probably require short term rehab placement once clinically stable. His condition is closely guarded. He is full code. 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  Expect renal function/hyponatremia to improve with hydration, and sensorium to clear as metabolic issues and infection addressed. COPD with exacerbation (HCC)  Levaquin  O2 supplementaion/bronchodilators as needed. No  need for steroids as seems mild at present. Hypothyroidism/Essential hypertension/Low back pain potentially associated with spinal stenosis/Seizure disorder  No acute changes.  Resume home meds. DVT/GI Prophylaxis  Lovenox  PPI Family Communication: Admission, patients condition and plan of care including tests being ordered have been discussed with the patient and he indicates understanding and agrees with the plan and Code Status.  Code Status   Full Code  Likely DC  To SNF when medically ready  Condition GUARDED    Time spent in minutes : 55  Chief Complaint Chief Complaint  Patient presents with  . Hyperglycemia  . Altered Mental Status     HPI Benjamin Hodge  is a 75 y.o. male who came in after he was apparenmtly found unresponsive at home, soiled in feces, and he was found to have high sugars. He says he probably forgot to take his insulin. He has also been coughing. He can't give me much more detail.   Review of Systems   As in the HPI above.   Past Medical History  Diagnosis Date  . CAD (coronary artery disease)     LAD 40-50% stenosis, first diagonal small 90% stenosis, circumflex 70% stenosis, OM 80% stenosis, right coronary artery 80-90% stenosis.  . Right upper lobe pneumonia 11/28/2008  . Anxiety 07/18/2008  . Irritable bowel syndrome 04/27/2008  . COPD (chronic obstructive pulmonary disease) (HCC) 02/27/2008  . B12 deficiency 02/16/2008    in 5/09: B12 262, MMA 1040  . Chronic diarrhea 01/01/2008    s/p EGD 10/06: H pylori + gastritis, duodenal biopsy normal (Dr. Elnoria Howard); s/p colonoscopy 10/06: hemorrhoids (Dr. Elnoria Howard); evaluated by Dr. Yancey Flemings  in 8/09: tissue transglutaminase AB negative, VIP normal, stool fat content normal, urine 5-HIAA normal; normal GES 11/09 (  done for "fluctuating sugars")  . 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 Orthostatic hypotension 02/09/2007  . Hypertension 02/09/2007  . Spinal stenosis in  cervical region 05/03    MRI  . Erectile dysfunction 09/27/2006    s/p penile implant  . Anemia, iron deficiency 09/27/2006    neg colonoscopy 2006 - Dr. Elnoria Howard; ferritin 152; hgb  15.7  on 07/07  . Hypothyroidism 09/27/2006    TSH 2.639  07/07  . Hypersomnia 09/27/2006    evaluated by Dr. Jetty Duhamel (5/08) and Dr. Vickey Huger; PSG 8/09: chronic delayed sleep phase syndrome (patient sleeps during the day and is awake at night), nocturnal myoclonus (eval for RLS and IDA suggested). Pt advised to change sleeping behavior  . Diabetes mellitus type II 09/07/1984    poorly controlled, complicated by peripheral neuropathy, microalbuminuria, mild non proliferative retinopathy, s/p DKA 7/05, on insulin pump x 4/09, started a pump vacation on 11/19/2008  . Hyperlipidemia   . 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 Hemorrhoids   . Chronic kidney disease   . Unspecified hereditary and idiopathic peripheral neuropathy   . Other chronic pain   . Other malaise and fatigue   . Depression   . Hypertrophy of prostate without urinary obstruction and other lower urinary tract symptoms (LUTS)   . Lumbago   . Heart attack (HCC) 03/2014    mild      Past Surgical History  Procedure Laterality Date  . Penile prosthesis implant    . Tonsillectomy    . Colonoscopy  06/25/2005    Dr. Jeani Hawking  . Left heart catheterization with coronary angiogram [name] 03/13/2014    Procedure: LEFT HEART CATHETERIZATION WITH CORONARY ANGIOGRAM;  Surgeon: Peter M Swaziland, MD;  Location: [facility] CATH LAB;  Service: Cardiovascular;  Laterality: [name];  . Peripheral vascular catheterization [name] 02/19/2015    Procedure: Abdominal Aortogram;  Surgeon: Nada Libman, MD;  Location: [facility] INVASIVE CV LAB;  Service: Cardiovascular;  Laterality: [name];  . Peripheral vascular catheterization Left 02/19/2015    Procedure: Lower Extremity Angiography;  Surgeon: Nada Libman, MD;  Location: [facility] INVASIVE CV LAB;  Service: Cardiovascular;  Laterality: Left;     Social History Social History  Substance Use Topics  . Smoking status: Former Smoker    Types: Cigarettes    Quit date: 09/28/2004  . Smokeless tobacco: Never Used  . Alcohol Use: 0.0 oz/week    0 Standard drinks or equivalent per week     Comment: occ/social (moderate)    Family History Family History  Problem Relation Age of Onset  . Bone cancer Mother   . Breast cancer Mother   . Cancer Mother   . Lung cancer Father   . Cancer Father   . Breast cancer Sister   . Cancer Sister   . Lung cancer Brother   . Cancer Sister     type unknown    Prior to Admission medications   Medication Sig Start Date End Date Taking? Authorizing Provider  clopidogrel (PLAVIX) 75 MG tablet TAKE 1 TABLET BY MOUTH DAILY WITH BREAKFAST 06/11/15  Yes Kimber Relic, MD  escitalopram (LEXAPRO) 20 MG tablet TAKE ONE TABLET BY MOUTH ONE TIME DAILY 08/07/15  Yes Kimber Relic, MD  HYDROcodone-acetaminophen (NORCO/VICODIN) 5-325 MG tablet Take 1 tablet by mouth 5 (five) times daily. Patient taking differently: Take 1 tablet by mouth 4 (four) times daily as needed for severe pain.  09/23/15  Yes [name] L [name], DO  Insulin Glargine (TOUJEO  SOLOSTAR) 300 UNIT/ML SOPN Inject 14 Units into the skin at bedtime. Patient taking differently: Inject 11 Units into the skin at bedtime.  09/08/15  Yes Catarina Hartshorn, MD  levETIRAcetam (KEPPRA) 750 MG tablet Take 1/2 tablet in morning. Take whole tablet at night. Patient taking differently: Take 375-750 mg by mouth 2 (two) times daily. Take 1/2 tablet in morning. Take whole tablet at night. 07/23/15  Yes Carmen Dohmeier, MD  levothyroxine (SYNTHROID, LEVOTHROID) 75 MCG tablet Take 1 tablet (75 mcg total) by mouth daily. 05/10/15  Yes Kimber Relic, MD  aspirin 81 MG tablet Take 81 mg by mouth at bedtime.     Historical Provider, MD  feeding supplement, GLUCERNA SHAKE, (GLUCERNA SHAKE) LIQD Take 237 mLs by mouth 3 (three) times daily between meals. Patient not taking:  Reported on 09/27/2015 09/08/15   Catarina Hartshorn, MD  insulin lispro (HUMALOG) 100 UNIT/ML KiwkPen Inject into the skin as directed. Pt checks BC QID. Glucose Dosage Formula Per Physician Instructions. Current Blood Sugar-100/60 +  # of Carbs per meal/15=Amount of Insulin To Take. 03/16/13   Reather Littler, MD  metoprolol tartrate (LOPRESSOR) 25 MG tablet Take One tablet by mouth twice daily to control BP 09/23/15   Kimber Relic, MD  Multiple Vitamin (MULTIVITAMIN WITH MINERALS) TABS tablet Take 1 tablet by mouth daily. Patient not taking: Reported on 09/27/2015 02/22/15   Osvaldo Shipper, MD  ONE TOUCH ULTRA TEST test strip TEST 8 TIMES PER DAY FOR 30 DAYS 04/26/14   Reather Littler, MD  pantoprazole (PROTONIX) 40 MG tablet TAKE ONE TABLET BY MOUTH ONE TIME DAILY 05/14/15   Kimber Relic, MD  pravastatin (PRAVACHOL) 20 MG tablet Take 1 tablet (20 mg total) by mouth every evening. NEEDS APPOINTMENT FOR FUTURE REFILLS OR 90-DAY SUPPLY 09/07/15   Rollene Rotunda, MD  pregabalin (LYRICA) 50 MG capsule Take one capsule by mouth in the morning and two capsules by mouth in the evening for pains. 08/14/15   Kimber Relic, MD  saccharomyces boulardii (FLORASTOR) 250 MG capsule Take 1 capsule (250 mg total) by mouth 2 (two) times daily. Patient taking differently: Take 250-500 mg by mouth every evening.  02/22/15   Osvaldo Shipper, MD  vitamin B-12 (CYANOCOBALAMIN) 1000 MCG tablet Take 1,000 mcg by mouth daily.    Historical Provider, MD    Allergies  Allergen Reactions  . Ace Inhibitors Other (See Comments)    dizzy  . Cymbalta [Duloxetine Hcl] Other (See Comments)    dizzy  . Lipitor [Atorvastatin] Other (See Comments)    Caused pain all over body.  . Bee Venom Swelling  . Glucerna [Nutritional Supplements] Diarrhea  . Angiotensin Receptor Blockers Other (See Comments)    unknown    Physical Exam  Vitals  Blood pressure 112/53, pulse 34, temperature 97.5 F (36.4 C), temperature source Oral, resp. rate 16, SpO2 100  %.   1. General: lying in bed in NAD.Somnolent  2. Normal affect and insight, Not Suicidal or Homicidal, Awake Alert, Oriented X 3.  3. No F.N deficits, ALL C.Nerves Intact, Strength 5/5 all 4 extremities, Sensation intact all 4 extremities, Plantars down going.  4. Ears and Eyes appear Normal, Conjunctivae clear, PERRLA. Moist Oral Mucosa.  5. Supple Neck, No JVD, No cervical lymphadenopathy appriciated, No Carotid Bruits.  6. Symmetrical Chest wall movement, Good air movement bilaterally, CTAB.  7. RRR, No Gallops, Rubs or Murmurs, No Parasternal Heave.  8. Positive Bowel Sounds, Abdomen Soft, Non tender, No organomegaly appriciated,No  rebound -guarding or rigidity.  9.  No Cyanosis, Normal Skin Turgor, No Skin Rash or Bruise.  10. Good muscle tone,  joints appear normal , no effusions, Normal ROM.  11. No Palpable Lymph Nodes in Neck or Axillae  Data Review CBC  Recent Labs Lab 09/27/15 1543  WBC 7.4  HGB 9.8*  HCT 28.6*  PLT 217  MCV 91.7  MCH 31.4  MCHC 34.3  RDW 12.4   ------------------------------------------------------------------------------------------------------------------  Chemistries   Recent Labs Lab 09/27/15 1543 09/27/15 2110  NA 126* 138  K 4.5 4.2  CL 93* 105  CO2 20* 25  GLUCOSE 673* 82  BUN 43* 39*  CREATININE 1.43* 1.22  CALCIUM 8.7* 8.9  AST 29  --   ALT 39  --   ALKPHOS 77  --   BILITOT 1.1  --    ------------------------------------------------------------------------------------------------------------------ CrCl cannot be calculated (Unknown ideal weight.). ------------------------------------------------------------------------------------------------------------------ No results for input(s): TSH, T4TOTAL, T3FREE, THYROIDAB in the last 72 hours.  Invalid input(s): FREET3   Coagulation profile No results for input(s): INR, PROTIME in the last 168  hours. ------------------------------------------------------------------------------------------------------------------- No results for input(s): DDIMER in the last 72 hours. -------------------------------------------------------------------------------------------------------------------  Cardiac Enzymes No results for input(s): CKMB, TROPONINI, MYOGLOBIN in the last 168 hours.  Invalid input(s): CK ------------------------------------------------------------------------------------------------------------------ Invalid input(s): POCBNP   ---------------------------------------------------------------------------------------------------------------  Urinalysis    Component Value Date/Time   COLORURINE YELLOW 09/27/2015 1657   APPEARANCEUR CLEAR 09/27/2015 1657   LABSPEC 1.028 09/27/2015 1657   PHURINE 5.0 09/27/2015 1657   GLUCOSEU >1000* 09/27/2015 1657   HGBUR NEGATIVE 09/27/2015 1657   BILIRUBINUR NEGATIVE 09/27/2015 1657   KETONESUR 15* 09/27/2015 1657   PROTEINUR NEGATIVE 09/27/2015 1657   UROBILINOGEN 0.2 03/10/2014 1711   NITRITE NEGATIVE 09/27/2015 1657   LEUKOCYTESUR NEGATIVE 09/27/2015 1657    ----------------------------------------------------------------------------------------------------------------  Imaging results  Dg Chest 1 View  09/27/2015  CLINICAL DATA:  Hyperglycemia and altered mental status, COPD EXAM: CHEST  1 VIEW COMPARISON:  09/14/2015 FINDINGS: Cardiac shadow is within normal limits. The lungs are well aerated bilaterally. Elevation of the left hemidiaphragm is again seen. No focal infiltrate or sizable effusion is noted. IMPRESSION: No acute abnormality noted. Electronically Signed   By: Alcide Clever M.D.   On: 09/27/2015 18:56   Ct Head Wo Contrast  09/27/2015  CLINICAL DATA:  Acute onset of confusion and altered mental status. Hyperglycemia. Recent fall at home. Initial encounter. EXAM: CT HEAD WITHOUT CONTRAST TECHNIQUE: Contiguous axial  images were obtained from the base of the skull through the vertex without intravenous contrast. COMPARISON:  CT of the head performed 04/30/2015 FINDINGS: There is no evidence of acute infarction, mass lesion, or intra- or extra-axial hemorrhage on CT. Prominence of the ventricles and sulci reflects mild to moderate cortical volume loss. Scattered periventricular white matter change likely reflects small vessel ischemic microangiopathy. Small chronic lacunar infarcts are suggested within the basal ganglia bilaterally. The brainstem and fourth ventricle are within normal limits. The cerebral hemispheres demonstrate grossly normal gray-white differentiation. No mass effect or midline shift is seen. There is no evidence of fracture; visualized osseous structures are unremarkable in appearance. The orbits are within normal limits. The paranasal sinuses and mastoid air cells are well-aerated. No significant soft tissue abnormalities are seen. IMPRESSION: 1. No acute intracranial pathology seen on CT. 2. Mild to moderate cortical volume loss and scattered small vessel ischemic microangiopathy. 3. Small chronic lacunar infarcts suggested within the basal ganglia bilaterally. Electronically Signed   By: Beryle Beams.D.  On: 09/27/2015 18:42   Portable Chest X-ray (1 View)  09/14/2015  CLINICAL DATA:  Diabetic ketoacidosis, acute onset. Patient has not eaten or had insulin in 2 days. Initial encounter. EXAM: PORTABLE CHEST 1 VIEW COMPARISON:  Chest radiograph performed 09/06/2015 FINDINGS: The lungs are well-aerated. There is elevation of the left hemidiaphragm. There is no evidence of focal opacification, pleural effusion or pneumothorax. The cardiomediastinal silhouette is within normal limits. No acute osseous abnormalities are seen. IMPRESSION: Elevation of the left hemidiaphragm.  Lungs remain grossly clear. Electronically Signed   By: Roanna Raider M.D.   On: 09/14/2015 00:47   Dg Chest Port 1  View  09/07/2015  CLINICAL DATA:  Acute onset of cough.  Initial encounter. EXAM: PORTABLE CHEST 1 VIEW COMPARISON:  Chest radiograph performed 03/10/2014 FINDINGS: The lungs are well-aerated. There is mild elevation of the left hemidiaphragm. Minimal bilateral atelectasis is noted. There is no evidence of pleural effusion or pneumothorax. The cardiomediastinal silhouette is within normal limits. No acute osseous abnormalities are seen. IMPRESSION: Mild elevation of the left hemidiaphragm. Minimal bilateral atelectasis noted. Electronically Signed   By: Roanna Raider M.D.   On: 09/07/2015 00:40        Trinisha Paget M.D on 09/27/2015 at 9:53 PM  Between 7am to 7pm - Pager - [phone]  After 7pm go to [email or web address] - password [email or web address]  And look for the night coverage person covering me after hours  [facility] Office  [phone]

## 2015-09-27 NOTE — ED Notes (Signed)
Called floor, spoke to charge advised still getting report and bed may change.  Charge to call back in 10-15 minutes.
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
## 2015-09-27 NOTE — ED Notes (Signed)
Nurse collecting labs at bedside 

## 2015-09-27 NOTE — ED Notes (Signed)
Bed: WA07 Expected date:  Expected time:  Means of arrival:  Comments: Hyperglycemia

## 2015-09-27 NOTE — ED Notes (Signed)
Charge RN to confirm bed.  Stated wants patient in 5  But, will call back to confirm.

## 2015-09-27 NOTE — Progress Notes (Signed)
Hypoglycemic Event  CBG: 50  (22:12)  Treatment: D50 50 ML- (22:19)  Finished IV push at 22:30  Symptoms: NONE  Follow-up CBG: Time:2245 CBG Result:159  Possible Reasons for Event: INSULIN DRIP--PT VERY SENSITIVE  Comments/MD notified: Insulin drip stopped--diet ordered and sliding scale    Bayard Hugger
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
## 2015-09-28 ENCOUNTER — Inpatient Hospital Stay (HOSPITAL_COMMUNITY): Payer: Medicare Other

## 2015-09-28 DIAGNOSIS — I6529 Occlusion and stenosis of unspecified carotid artery: Secondary | ICD-10-CM

## 2015-09-28 LAB — BASIC METABOLIC PANEL
ANION GAP: 10 (ref 5–15)
BUN: 36 mg/dL — ABNORMAL HIGH (ref 6–20)
CHLORIDE: 105 mmol/L (ref 101–111)
CO2: 24 mmol/L (ref 22–32)
Calcium: 8.9 mg/dL (ref 8.9–10.3)
Creatinine, Ser: 1.19 mg/dL (ref 0.61–1.24)
GFR calc non Af Amer: 59 mL/min — ABNORMAL LOW (ref >60)
GLUCOSE: 258 mg/dL — AB (ref 65–99)
POTASSIUM: 4.2 mmol/L (ref 3.5–5.1)
Sodium: 139 mmol/L (ref 135–145)

## 2015-09-28 LAB — COMPREHENSIVE METABOLIC PANEL
ALBUMIN: 2.9 g/dL — AB (ref 3.5–5.0)
ALK PHOS: 68 U/L (ref 38–126)
ALT: 32 U/L (ref 17–63)
AST: 27 U/L (ref 15–41)
Anion gap: 9 (ref 5–15)
BILIRUBIN TOTAL: 0.8 mg/dL (ref 0.3–1.2)
BUN: 32 mg/dL — AB (ref 6–20)
CALCIUM: 8.7 mg/dL — AB (ref 8.9–10.3)
CO2: 24 mmol/L (ref 22–32)
Chloride: 105 mmol/L (ref 101–111)
Creatinine, Ser: 1.24 mg/dL (ref 0.61–1.24)
GFR calc Af Amer: >60 mL/min (ref >60)
GFR, EST NON AFRICAN AMERICAN: 56 mL/min — AB (ref >60)
GLUCOSE: 316 mg/dL — AB (ref 65–99)
Potassium: 5.5 mmol/L — ABNORMAL HIGH (ref 3.5–5.1)
Sodium: 138 mmol/L (ref 135–145)
TOTAL PROTEIN: 5.7 g/dL — AB (ref 6.5–8.1)

## 2015-09-28 LAB — CBC
HEMATOCRIT: 28.5 % — AB (ref 39.0–52.0)
HEMOGLOBIN: 9.5 g/dL — AB (ref 13.0–17.0)
MCH: 30.8 pg (ref 26.0–34.0)
MCHC: 33.3 g/dL (ref 30.0–36.0)
MCV: 92.5 fL (ref 78.0–100.0)
Platelets: 216 10*3/uL (ref 150–400)
RBC: 3.08 MIL/uL — ABNORMAL LOW (ref 4.22–5.81)
RDW: 12.7 % (ref 11.5–15.5)
WBC: 7 10*3/uL (ref 4.0–10.5)

## 2015-09-28 LAB — GLUCOSE, CAPILLARY
GLUCOSE-CAPILLARY: 50 mg/dL — AB (ref 65–99)
GLUCOSE-CAPILLARY: 159 mg/dL — AB (ref 65–99)
GLUCOSE-CAPILLARY: 266 mg/dL — AB (ref 65–99)
GLUCOSE-CAPILLARY: 219 mg/dL — AB (ref 65–99)
GLUCOSE-CAPILLARY: 76 mg/dL (ref 65–99)
GLUCOSE-CAPILLARY: 148 mg/dL — AB (ref 65–99)
Glucose-Capillary: 191 mg/dL — ABNORMAL HIGH (ref 65–99)
Glucose-Capillary: 267 mg/dL — ABNORMAL HIGH (ref 65–99)
Glucose-Capillary: 46 mg/dL — ABNORMAL LOW (ref 65–99)
Glucose-Capillary: 50 mg/dL — ABNORMAL LOW (ref 65–99)
Glucose-Capillary: 93 mg/dL (ref 65–99)

## 2015-09-28 LAB — MAGNESIUM: Magnesium: 1.6 mg/dL — ABNORMAL LOW (ref 1.7–2.4)

## 2015-09-28 LAB — PHOSPHORUS: Phosphorus: 2.9 mg/dL (ref 2.5–4.6)

## 2015-09-28 LAB — PROTIME-INR
INR: 1.01 (ref 0.00–1.49)
PROTHROMBIN TIME: 13.5 s (ref 11.6–15.2)

## 2015-09-28 LAB — VITAMIN B12: Vitamin B-12: 1841 pg/mL — ABNORMAL HIGH (ref 180–914)

## 2015-09-28 LAB — MRSA PCR SCREENING: MRSA by PCR: INVALID — AB

## 2015-09-28 LAB — RPR: RPR: NONREACTIVE

## 2015-09-28 LAB — RAPID HIV SCREEN (HIV 1/2 AB+AG)
HIV 1/2 Antibodies: NONREACTIVE
HIV-1 P24 ANTIGEN - HIV24: NONREACTIVE

## 2015-09-28 LAB — APTT: aPTT: 35 seconds (ref 24–37)

## 2015-09-28 MED ORDER — SODIUM CHLORIDE 0.9 % IV BOLUS (SEPSIS)
250.0000 mL | Freq: Once | INTRAVENOUS | Status: AC
  Administered 2015-09-28: 250 mL via INTRAVENOUS

## 2015-09-28 MED ORDER — SODIUM CHLORIDE 0.9 % IV BOLUS (SEPSIS)
500.0000 mL | Freq: Once | INTRAVENOUS | Status: AC
  Administered 2015-09-28: 500 mL via INTRAVENOUS

## 2015-09-28 MED ORDER — INSULIN ASPART 100 UNIT/ML ~~LOC~~ SOLN
0.0000 [IU] | SUBCUTANEOUS | Status: DC
  Administered 2015-09-28: 2 [IU] via SUBCUTANEOUS
  Administered 2015-09-29 (×2): 3 [IU] via SUBCUTANEOUS
  Administered 2015-09-29: 2 [IU] via SUBCUTANEOUS
  Administered 2015-09-29 – 2015-09-30 (×3): 3 [IU] via SUBCUTANEOUS
  Administered 2015-09-30: 5 [IU] via SUBCUTANEOUS
  Administered 2015-09-30: 3 [IU] via SUBCUTANEOUS
  Administered 2015-09-30 (×2): 2 [IU] via SUBCUTANEOUS
  Administered 2015-10-01: 5 [IU] via SUBCUTANEOUS
  Administered 2015-10-01: 3 [IU] via SUBCUTANEOUS
  Administered 2015-10-01: 2 [IU] via SUBCUTANEOUS
  Administered 2015-10-01: 3 [IU] via SUBCUTANEOUS
  Administered 2015-10-02 (×2): 8 [IU] via SUBCUTANEOUS
  Administered 2015-10-02: 5 [IU] via SUBCUTANEOUS
  Administered 2015-10-03 (×2): 3 [IU] via SUBCUTANEOUS
  Administered 2015-10-03: 2 [IU] via SUBCUTANEOUS

## 2015-09-28 MED ORDER — MAGNESIUM SULFATE 2 GM/50ML IV SOLN
2.0000 g | Freq: Once | INTRAVENOUS | Status: AC
  Administered 2015-09-28: 2 g via INTRAVENOUS
  Filled 2015-09-28: qty 50

## 2015-09-28 MED ORDER — GADOBENATE DIMEGLUMINE 529 MG/ML IV SOLN
15.0000 mL | Freq: Once | INTRAVENOUS | Status: AC | PRN
  Administered 2015-09-28: 11 mL via INTRAVENOUS

## 2015-09-28 MED ORDER — SODIUM CHLORIDE 0.9 % IV BOLUS (SEPSIS)
1000.0000 mL | Freq: Once | INTRAVENOUS | Status: AC
Start: 2015-09-28 — End: 2015-09-28
  Administered 2015-09-28: 1000 mL via INTRAVENOUS

## 2015-09-28 MED ORDER — METRONIDAZOLE IN NACL 5-0.79 MG/ML-% IV SOLN
500.0000 mg | Freq: Three times a day (TID) | INTRAVENOUS | Status: DC
  Administered 2015-09-28 – 2015-09-30 (×6): 500 mg via INTRAVENOUS
  Filled 2015-09-28 (×6): qty 100

## 2015-09-28 NOTE — Progress Notes (Signed)
VASCULAR LAB PRELIMINARY  PRELIMINARY  PRELIMINARY  PRELIMINARY  Carotid duplex completed.    Preliminary report:  Bilateral:  1-39% ICA stenosis.  Vertebral artery flow is antegrade.     Rahmel Nedved, RVS 09/28/2015, 1:26 PM

## 2015-09-28 NOTE — Evaluation (Signed)
Physical Therapy Evaluation Patient Details Name: Benjamin Hodge MRN: [id number] DOB: 02/07/1941 Today's Date: 09/28/2015   History of Present Illness  75 y.o. male with PMH of insulin-dependent diabetes mellitus, chronic pain with narcotic dependency, CAD, COPD, and depression who presents to the ED when he was not able to control the sugar with insulin at home, found to have hyperglycemia and confusion. family reports patient is not able to care for self.  Clinical Impression  Patient is pleasant, has low BP so did not pursue ambulation as RN reported that patient was very weak walking to/from BR, chair had to be brought up.. Patient will benefit from PT to address problems listed in note below to Dc to next venue if indicated.  Follow Up Recommendations SNF;Supervision/Assistance - 24 hour    Equipment Recommendations  Rolling walker with 5" wheels    Recommendations for Other Services       Precautions / Restrictions Precautions Precautions: Fall Restrictions Weight Bearing Restrictions: No      Mobility  Bed Mobility               General bed mobility comments: in recliner  Transfers Overall transfer level: Needs assistance Equipment used: Rolling walker (2 wheeled) Transfers: Sit to/from Stand Sit to Stand: Supervision         General transfer comment: cus for safwty.  Ambulation/Gait Ambulation/Gait assistance: Min assist Ambulation Distance (Feet): 6 Feet Assistive device: Rolling walker (2 wheeled) Gait Pattern/deviations: Step-to pattern;Shuffle     General Gait Details: limited due to recent low BP taken by RN 5 minutes ago: 72/40  Stairs            Wheelchair Mobility    Modified Rankin (Stroke Patients Only)       Balance Overall balance assessment: History of Falls;Needs assistance         Standing balance support: During functional activity;Bilateral upper extremity supported Standing balance-Leahy Scale: Poor                                Pertinent Vitals/Pain Pain Assessment: No/denies pain    Home Living Family/patient expects to be discharged to:: Skilled nursing facility Living Arrangements: Other relatives   Type of Home: House Home Access: Stairs to enter   Entrance Stairs-Number of Steps: 1 Home Layout: Two level;Able to live on main level with bedroom/bathroom Home Equipment: None Additional Comments: granddaughter lives upstairs but not abvailable 24/7    Prior Function Level of Independence: Independent               Hand Dominance        Extremity/Trunk Assessment   Upper Extremity Assessment: Generalized weakness           Lower Extremity Assessment: Generalized weakness         Communication   Communication: No difficulties  Cognition Arousal/Alertness: Awake/alert Behavior During Therapy: WFL for tasks assessed/performed Overall Cognitive Status: Impaired/Different from baseline Area of Impairment: Following commands;Safety/judgement;Problem solving         Safety/Judgement: Decreased awareness of safety          General Comments      Exercises        Assessment/Plan    PT Assessment Patient needs continued PT services  PT Diagnosis Difficulty walking   PT Problem List Decreased strength;Decreased activity tolerance;Decreased mobility;Cardiopulmonary status limiting activity;Decreased knowledge of precautions;Decreased safety awareness;Decreased knowledge of use of DME  PT Treatment  Interventions DME instruction;Gait training;Functional mobility training;Therapeutic activities;Therapeutic exercise;Patient/family education   PT Goals (Current goals can be found in the Care Plan section) Acute Rehab PT Goals Patient Stated Goal: to use a walker if I need it. PT Goal Formulation: With patient Time For Goal Achievement: 10/12/15 Potential to Achieve Goals: Good    Frequency Min 3X/week   Barriers to discharge Decreased  caregiver support      Co-evaluation               End of Session   Activity Tolerance: Patient limited by fatigue Patient left: in chair;with call bell/phone within reach;with chair alarm set;with nursing/sitter in room Nurse Communication: Mobility status         Time: 1610-9604 PT Time Calculation (min) (ACUTE ONLY): 21 min   Charges:   PT Evaluation $PT Eval Low Complexity: 1 Procedure     PT G CodesRada Hay 09/28/2015, 3:43 PM Blanchard Kelch PT [phone]

## 2015-09-28 NOTE — Significant Event (Signed)
Met with son Leonor Liv and conference called with daughter Terence Lux. They say patient is more forgetful lately, sleeps long hours, and probably takes too much pain medication hence not taking meals and insulin consistently. Things changed in the last few months. Question is whether patient has early dementia, had silent CVA, etc. Will therefore obtain brain MRI/Carotid doppler/2D Echo. Returning home does not seem like a safe option at present.

## 2015-09-28 NOTE — Progress Notes (Signed)
Report called and given to Herbert Seta, rn on 3 [address] and pt now transferred to 1338. Left unit in wheelchair pushed by nurse tech. Left in good/stable condition. Vwilliams,rn .

## 2015-09-28 NOTE — Progress Notes (Signed)
Benjamin Hodge UJW:[id number] DOB: 04-23-1941 DOA: 09/27/2015 PCP: Kimber Relic, MD  Assessment&Care Plan Day of admission 09/27/15 Benjamin Hodge is a pleasant 75 year male with multiple medical problems, including Insulin dependent Diabetes Mellitus, poorly controlled HbA1C 12.4, COPD, Essential Htn, Hypothyroidism who comes in with reduced level of consciousness associated with high sugars, and he is found to have Acute Metabolic encephalopathy in setting of nonketotic hyperosmolar hyperglycemia with hyponatremia/acute kidney injury and narcotics. He also has acute exacerbation of COPD. Unfortunately, his history is rather limited as he remains disoriented and there is no family member at bedside. Patient has had multiple admissions in the last month or two and it is apparent that he is failing to cope at home. Today he is not sure if he forgot to take his insulin but he admits to increasing cough. Labs are significant for wbc 7400, bun/cr 43/1.43, sodium 126 glucose 673 mg/dl. UA does not suggest UTI. CXR does not show acute infiltrate. He will be admitted to the Step Down Unit for IVF/glucose stabilizer/empiric antibiotics, and bronchodilators. He will probably require short term rehab placement once clinically stable. His condition is closely guarded. He is full code. Summary&Daily Progress Notes 09/28/15:  Patient more with it. Hyperglycemia has resolved-in fact had episode of hypoglycemia earlier today. It appears that sleep hygiene is contributing to the brittle sugars as patient tells me that he goes to bed at 12 midnight at which point he takes Lantus then wakes up after 12:30 in the afternoon(more than 12 hours of sleep). It is conceivable that patient would have relative hypoglycemia related to prolonged period of no food intake, causing sugar readings to give patient false sense of security about sugar control when he eventually wakes up. Likely this would cause him to take a smaller amounts  of short acting insulin when he has his first meal, probably creating a vicious cycle of hyperglycemia/hypoglycemia. Patient tells me that things have changed in the last few months with him losing energy(he attributes this to aging). A contributing factor is likely narcotics which he uses for his lower back. I wonder if anything else has changed-?weight loss etc. His son Carolyn Stare is concerned whether he is able to cope by himself at home. I have called [name] and we will meet with his father later today and explore options to address the social situation so there is no revolving window of admissions and potential catastrophic outcome at home if things don't change. Meanwhile, will check CEA/CA-19-9, continue Lantus/ssi/Levaquin, replenish magnesium, follow physical therapy/case management and transfer to MedSurg when bed becomes available. 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 Will discuss with patient and his son as to the best way forward for discharge. It is not clear what option will become most optimal. Consultants:  None Procedures:  None Antibiotics:  Levaquin 09/27/2015>  HPI/Subjective: Feels better  and is hungry.  Objective: Filed Vitals:   09/28/15 0400 09/28/15 0442  BP: 123/47   Pulse: 61   Temp:   97.8 F (36.6 C)  Resp: 17     Intake/Output Summary (Last 24 hours) at 09/28/15 0843 Last data filed at 09/28/15 0600  Gross per 24 hour  Intake 2113.39 ml  Output   1000 ml  Net 1113.39 ml   Filed Weights   09/27/15 2100 09/28/15 0442  Weight: 55.3 kg (121 lb 14.6 oz) 55.9 kg (123 lb 3.8 oz)    Exam:   General:  Comfortable at rest.  Cardiovascular: S1-S2 normal. No murmurs. Pulse regular.  Respiratory: Good air entry bilaterally. No rhonchi or rales.  Abdomen: Soft and nontender. Normal bowel sounds. No organomegaly.  Musculoskeletal: No pedal edema   Neurological: Intact  Data Reviewed: Basic Metabolic Panel:  Recent Labs Lab 09/27/15 1543 09/27/15 2110 09/28/15 0110 09/28/15 0511  NA 126* 138 139 138  K 4.5 4.2 4.2 5.5*  CL 93* 105 105 105  CO2 20* 25 24 24   GLUCOSE 673* 82 258* 316*  BUN 43* 39* 36* 32*  CREATININE 1.43* 1.22 1.19 1.24  CALCIUM 8.7* 8.9 8.9 8.7*  MG  --   --   --  1.6*  PHOS  --   --   --  2.9   Liver Function Tests:  Recent Labs Lab 09/27/15 1543 09/28/15 0511  AST 29 27  ALT 39 32  ALKPHOS 77 68  BILITOT 1.1 0.8  PROT 6.2* 5.7*  ALBUMIN 3.4* 2.9*   No results for input(s): LIPASE, AMYLASE in the last 168 hours. No results for input(s): AMMONIA in the last 168 hours. CBC:  Recent Labs Lab 09/27/15 1543 09/28/15 0511  WBC 7.4 7.0  HGB 9.8* 9.5*  HCT 28.6* 28.5*  MCV 91.7 92.5  PLT 217 216   Cardiac Enzymes: No results for input(s): CKTOTAL, CKMB, CKMBINDEX, TROPONINI in the last 168 hours. BNP (last 3 results) No results for input(s): BNP in the last 8760 hours.  ProBNP (last 3 results) No results for input(s): PROBNP in the last 8760 hours.  CBG:  Recent Labs Lab 09/27/15 2106 09/27/15 2210 09/27/15 2212 09/27/15 2249 09/28/15 0611  GLUCAP 93 38* 50* 159* 266*    Recent Results (from the past 240 hour(s))  MRSA PCR Screening     Status: Abnormal   Collection Time: 09/27/15 10:02 PM  Result  Value Ref Range Status   MRSA by PCR INVALID RESULTS, SPECIMEN SENT FOR CULTURE (A) NEGATIVE Final    Comment:        The GeneXpert MRSA Assay (FDA approved for NASAL specimens only), is one component of a comprehensive MRSA colonization surveillance program. It is not intended to diagnose MRSA infection nor to guide or monitor treatment for MRSA infections. Brayton Layman RN 1610 09/28/15 A NAVARRO      Studies: Dg Chest 1 View  09/27/2015  CLINICAL DATA:  Hyperglycemia and altered mental status, COPD EXAM: CHEST  1 VIEW COMPARISON:  09/14/2015 FINDINGS: Cardiac shadow is within normal limits. The lungs are well aerated bilaterally. Elevation of the left hemidiaphragm is again seen. No focal infiltrate or sizable effusion is noted. IMPRESSION: No acute abnormality noted. Electronically Signed   By: Alcide Clever M.D.   On: 09/27/2015 18:56   Ct Head Wo Contrast  09/27/2015  CLINICAL DATA:  Acute onset of confusion and altered mental status. Hyperglycemia. Recent fall at home. Initial encounter. EXAM: CT  HEAD WITHOUT CONTRAST TECHNIQUE: Contiguous axial images were obtained from the base of the skull through the vertex without intravenous contrast. COMPARISON:  CT of the head performed 04/30/2015 FINDINGS: There is no evidence of acute infarction, mass lesion, or intra- or extra-axial hemorrhage on CT. Prominence of the ventricles and sulci reflects mild to moderate cortical volume loss. Scattered periventricular white matter change likely reflects small vessel ischemic microangiopathy. Small chronic lacunar infarcts are suggested within the basal ganglia bilaterally. The brainstem and fourth ventricle are within normal limits. The cerebral hemispheres demonstrate grossly normal gray-white differentiation. No mass effect or midline shift is seen. There is no evidence of fracture; visualized osseous structures are unremarkable in appearance. The orbits are within normal limits. The paranasal  sinuses and mastoid air cells are well-aerated. No significant soft tissue abnormalities are seen. IMPRESSION: 1. No acute intracranial pathology seen on CT. 2. Mild to moderate cortical volume loss and scattered small vessel ischemic microangiopathy. 3. Small chronic lacunar infarcts suggested within the basal ganglia bilaterally. Electronically Signed   By: Roanna Raider M.D.   On: 09/27/2015 18:42    Scheduled Meds: . antiseptic oral rinse  7 mL Mouth Rinse BID  . aspirin  81 mg Oral QHS  . clopidogrel  75 mg Oral Q breakfast  . enoxaparin (LOVENOX) injection  40 mg Subcutaneous Q24H  . escitalopram  20 mg Oral Daily  . insulin aspart  0-15 Units Subcutaneous TID WC  . insulin aspart  0-5 Units Subcutaneous QHS  . insulin glargine  11 Units Subcutaneous QHS  . levETIRAcetam  380 mg Oral Daily  . levETIRAcetam  750 mg Oral QHS  . levofloxacin (LEVAQUIN) IV  750 mg Intravenous Q48H  . levothyroxine  75 mcg Oral QAC breakfast  . magnesium sulfate 1 - 4 g bolus IVPB  2 g Intravenous Once  . metoprolol tartrate  25 mg Oral BID  . pantoprazole  40 mg Oral Daily  . pravastatin  20 mg Oral QPM  . pregabalin  100 mg Oral QHS  . pregabalin  50 mg Oral Daily  . saccharomyces boulardii  250 mg Oral BID  . sodium chloride  3 mL Intravenous Q12H  . vitamin B-12  1,000 mcg Oral Daily   Continuous Infusions: . sodium chloride 100 mL/hr at 09/27/15 2309     Time spent: 25 minutes    Zynasia Burklow  [facility] Pager [phone]. If 7PM-7AM, please contact night-coverage at [email or web address], password [facility] 09/28/2015, 8:43 AM  LOS: 1 day

## 2015-09-29 ENCOUNTER — Inpatient Hospital Stay (HOSPITAL_COMMUNITY): Payer: Medicare Other

## 2015-09-29 LAB — COMPREHENSIVE METABOLIC PANEL
ALBUMIN: 3.1 g/dL — AB (ref 3.5–5.0)
ALT: 27 U/L (ref 17–63)
ANION GAP: 7 (ref 5–15)
AST: 24 U/L (ref 15–41)
Alkaline Phosphatase: 68 U/L (ref 38–126)
BUN: 26 mg/dL — AB (ref 6–20)
CHLORIDE: 110 mmol/L (ref 101–111)
CO2: 22 mmol/L (ref 22–32)
Calcium: 8.5 mg/dL — ABNORMAL LOW (ref 8.9–10.3)
Creatinine, Ser: 1.21 mg/dL (ref 0.61–1.24)
GFR calc Af Amer: >60 mL/min (ref >60)
GFR, EST NON AFRICAN AMERICAN: 58 mL/min — AB (ref >60)
GLUCOSE: 159 mg/dL — AB (ref 65–99)
POTASSIUM: 3.7 mmol/L (ref 3.5–5.1)
Sodium: 139 mmol/L (ref 135–145)
Total Bilirubin: 0.2 mg/dL — ABNORMAL LOW (ref 0.3–1.2)
Total Protein: 5.7 g/dL — ABNORMAL LOW (ref 6.5–8.1)

## 2015-09-29 LAB — CANCER ANTIGEN 19-9: CA 19 9: 113 U/mL — AB (ref 0–35)

## 2015-09-29 LAB — CBC
HCT: 30.2 % — ABNORMAL LOW (ref 39.0–52.0)
Hemoglobin: 10 g/dL — ABNORMAL LOW (ref 13.0–17.0)
MCH: 31.6 pg (ref 26.0–34.0)
MCHC: 33.1 g/dL (ref 30.0–36.0)
MCV: 95.6 fL (ref 78.0–100.0)
PLATELETS: 192 10*3/uL (ref 150–400)
RBC: 3.16 MIL/uL — ABNORMAL LOW (ref 4.22–5.81)
RDW: 13 % (ref 11.5–15.5)
WBC: 5.2 10*3/uL (ref 4.0–10.5)

## 2015-09-29 LAB — GLUCOSE, CAPILLARY
GLUCOSE-CAPILLARY: 152 mg/dL — AB (ref 65–99)
GLUCOSE-CAPILLARY: 95 mg/dL (ref 65–99)
GLUCOSE-CAPILLARY: 139 mg/dL — AB (ref 65–99)
Glucose-Capillary: 182 mg/dL — ABNORMAL HIGH (ref 65–99)
Glucose-Capillary: 155 mg/dL — ABNORMAL HIGH (ref 65–99)
Glucose-Capillary: 122 mg/dL — ABNORMAL HIGH (ref 65–99)

## 2015-09-29 LAB — MRSA CULTURE

## 2015-09-29 LAB — CEA: CEA: 12.2 ng/mL — AB (ref 0.0–4.7)

## 2015-09-29 NOTE — Progress Notes (Signed)
*  PRELIMINARY RESULTS* Echocardiogram 2D Echocardiogram has been performed.  Doristine Section 09/29/2015, 1:08 PM

## 2015-09-29 NOTE — Progress Notes (Signed)
Pt sat up in chair for several hours today. Family in to visit. Pt needs a lot of support with his diabetes and education. States he was diagnosed in 66.

## 2015-09-29 NOTE — Progress Notes (Signed)
Nutrition Brief Note  Patient identified on the Malnutrition Screening Tool (MST) Report  Pt eating well, wt has remained stable.   Wt Readings from Last 15 Encounters:  09/29/15 125 lb 3.5 oz (56.8 kg)  09/06/15 126 lb 5.2 oz (57.3 kg)  07/16/15 127 lb (57.607 kg)  07/02/15 123 lb 9.6 oz (56.065 kg)  06/06/15 125 lb 14.4 oz (57.108 kg)  05/29/15 127 lb 3.2 oz (57.698 kg)  05/27/15 127 lb (57.607 kg)  05/04/15 129 lb 9.6 oz (58.786 kg)  04/10/15 124 lb (56.246 kg)  03/21/15 124 lb 8 oz (56.473 kg)  02/22/15 127 lb 6.8 oz (57.8 kg)  01/02/15 125 lb 9.6 oz (56.972 kg)  11/27/14 130 lb (58.968 kg)  10/08/14 125 lb (56.7 kg)  09/14/14 123 lb (55.792 kg)    Body mass index is 19.61 kg/(m^2). Patient meets criteria for normal range based on current BMI.   Current diet order is HH/CHO modified, patient is consuming approximately 100% of meals at this time. Labs and medications reviewed.   No nutrition interventions warranted at this time. If nutrition issues arise, please consult RD.   Tilda Franco, MS, RD, LDN Pager: [phone] After Hours Pager: [phone]

## 2015-09-29 NOTE — Progress Notes (Signed)
BREES HOUNSHELL EPP:[id number] DOB: 11-06-1940 DOA: 09/27/2015 PCP: Estill Dooms, MD  Assessment&Care Plan Day of admission 09/27/15 Benjamin Hodge is a pleasant 75 year male with multiple medical problems, including Insulin dependent Diabetes Mellitus, poorly controlled HbA1C 12.4, COPD, Essential Htn, Hypothyroidism who comes in with reduced level of consciousness associated with high sugars, and he is found to have Acute Metabolic encephalopathy in setting of nonketotic hyperosmolar hyperglycemia with hyponatremia/acute kidney injury and narcotics. He also has acute exacerbation of COPD. Unfortunately, his history is rather limited as he remains disoriented and there is no family member at bedside. Patient has had multiple admissions in the last month or two and it is apparent that he is failing to cope at home. Today he is not sure if he forgot to take his insulin but he admits to increasing cough. Labs are significant for wbc 7400, bun/cr 43/1.43, sodium 126 glucose 673 mg/dl. UA does not suggest UTI. CXR does not show acute infiltrate. He will be admitted to the Step Down Unit for IVF/glucose stabilizer/empiric antibiotics, and bronchodilators. He will probably require short term rehab placement once clinically stable. His condition is closely guarded. He is full code. Summary&Daily Progress Notes 09/28/15: Patient more with it. Hyperglycemia has resolved-in fact had episode of hypoglycemia earlier today. It appears that sleep hygiene is contributing to the brittle sugars as patient tells me that he goes to bed at 12 midnight at which point he takes Lantus then wakes up after 12:30 in the afternoon(more than 12 hours of sleep). It is conceivable that patient would have relative hypoglycemia related to prolonged period of no food intake, causing sugar readings to give patient false sense of security about sugar control when he eventually wakes up. Likely this would cause him to take a smaller amounts  of short acting insulin when he has his first meal, probably creating a vicious cycle of hyperglycemia/hypoglycemia. Patient tells me that things have changed in the last few months with him losing energy(he attributes this to aging). A contributing factor is likely narcotics which he uses for his lower back. I wonder if anything else has changed-?weight loss etc. His son Benjamin Hodge is concerned whether he is able to cope by himself at home. I have called Benjamin Hodge and we will meet with his father later today and explore options to address the social situation so there is no revolving window of admissions and potential catastrophic outcome at home if things don't change. Meanwhile, will check CEA/CA-19-9, continue Lantus/ssi/Levaquin, replenish magnesium, follow physical therapy/case management and transfer to [place] when bed becomes available. 1223p: Met with son Benjamin Hodge and conference called with daughter Benjamin Hodge. They say patient is more forgetful lately, sleeps long hours, and probably takes too much pain medication hence not taking meals and insulin consistently. Things changed in the last few months. Question is whether patient has early dementia, had silent CVA, etc. Will therefore obtain brain MRI/Carotid doppler/2D Echo. Returning home does not seem like a safe option at present. 09/29/15: Patient developed diarrhea and hypotension yesterday but responded to IV fluids. There is concern for C. difficile colitis as patient apparently had diarrhea at home out of the norm. He was therefore started on Flagyl pending stool studies which were requested on 09/28/2015. Brain MRI/carotid Doppler/2-D echocardiogram unrevealing. Sugars better controlled, so is her blood pressure. Noted recommendations for skilled nursing facility placement. Appreciate physical therapy. Will continue Levaquin/Flagyl pending septic workup, and request social work input for short-term rehabilitation placement. Patient in agreement with  this  plan. 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 Consultants:  None Procedures:  None Antibiotics:  Levaquin 09/27/2015>  Flagyl 09/28/15>  HPI/Subjective: Coughing, diarrhea improved. Good appetite.  Objective: Filed Vitals:   09/29/15 1315 09/29/15 1957  BP: 152/63 133/62  Pulse: 70 65  Temp: 98.7 F (37.1 C) 97.2 F (36.2 C)  Resp: 18 16    Intake/Output Summary (Last 24 hours) at 09/29/15 2052 Last data filed at 09/29/15 1835  Gross per 24 hour  Intake 5415.47 ml  Output   2625 ml  Net 2790.47 ml   Filed Weights   09/28/15 0442 09/28/15 1430 09/29/15 0415  Weight: 55.9 kg (123 lb 3.8 oz) 55.877 kg (123 lb 3 oz) 56.8 kg (125 lb 3.5 oz)    Exam:   General:  Comfortable at rest.  Cardiovascular: S1-S2 normal. No murmurs. Pulse regular.  Respiratory: Good air entry bilaterally. No rhonchi or rales.  Abdomen: Soft and nontender. Normal bowel sounds. No organomegaly.  Musculoskeletal: No pedal edema   Neurological: Intact  Data Reviewed: Basic Metabolic Panel:  Recent Labs Lab 09/27/15 1543 09/27/15 2110 09/28/15 0110 09/28/15 0511 09/29/15 0450  NA 126* 138 139 138 139  K 4.5 4.2 4.2 5.5* 3.7  CL 93* 105 105 105 110  CO2 20* 25 24 24 22   GLUCOSE 673* 82 258* 316* 159*  BUN 43* 39* 36* 32* 26*  CREATININE  1.43* 1.22 1.19 1.24 1.21  CALCIUM 8.7* 8.9 8.9 8.7* 8.5*  MG  --   --   --  1.6*  --   PHOS  --   --   --  2.9  --    Liver Function Tests:  Recent Labs Lab 09/27/15 1543 09/28/15 0511 09/29/15 0450  AST 29 27 24   ALT 39 32 27  ALKPHOS 77 68 68  BILITOT 1.1 0.8 0.2*  PROT 6.2* 5.7* 5.7*  ALBUMIN 3.4* 2.9* 3.1*   No results for input(s): LIPASE, AMYLASE in the last 168 hours. No results for input(s): AMMONIA in the last 168 hours. CBC:  Recent Labs Lab 09/27/15 1543 09/28/15 0511 09/29/15 0450  WBC 7.4 7.0 5.2  HGB 9.8* 9.5* 10.0*  HCT 28.6* 28.5* 30.2*  MCV 91.7 92.5 95.6  PLT 217 216 192   Cardiac Enzymes: No results for input(s): CKTOTAL, CKMB, CKMBINDEX, TROPONINI in the last 168 hours. BNP (last 3 results) No results for input(s): BNP in the last 8760 hours.  ProBNP (last 3 results) No results for input(s): PROBNP in the last 8760 hours.  CBG:  Recent Labs Lab 09/29/15 0400 09/29/15 0737 09/29/15 1142 09/29/15 1655 09/29/15 1959  GLUCAP 152* 95 182* 155* 122*    Recent Results (from the past 240 hour(s))  Urine culture     Status: None (Preliminary result)   Collection Time: 09/27/15  4:57 PM  Result Value Ref Range Status   Specimen  Description URINE, CLEAN CATCH  Final   Special Requests NONE  Final   Culture   Final    10,000 COLONIES/mL STAPHYLOCOCCUS SPECIES (COAGULASE NEGATIVE) Performed at [facility]    Report Status PENDING  Incomplete  MRSA PCR Screening     Status: Abnormal   Collection Time: 09/27/15 10:02 PM  Result Value Ref Range Status   MRSA by PCR INVALID RESULTS, SPECIMEN SENT FOR CULTURE (A) NEGATIVE Final    Comment:        The GeneXpert MRSA Assay (FDA approved for NASAL specimens only), is one component of a comprehensive MRSA colonization surveillance program. It is not intended to diagnose MRSA infection nor to guide or monitor treatment for MRSA infections. Dennis Bast RN [id number] 10/21/2015 A  NAVARRO      Studies: Mr Kizzie Fantasia Contrast  10-21-2015  CLINICAL DATA:  75 year old hypertensive diabetic male with hyperlipidemia presenting with reduced level of consciousness. Subsequent encounter. EXAM: MRI HEAD WITHOUT AND WITH CONTRAST TECHNIQUE: Multiplanar, multiecho pulse sequences of the brain and surrounding structures were obtained without and with intravenous contrast. CONTRAST:  98m MULTIHANCE GADOBENATE DIMEGLUMINE 529 MG/ML IV SOLN COMPARISON:  09/27/2015 CT. FINDINGS: No acute infarct or intracranial hemorrhage. Mild small vessel disease type changes. Moderate global atrophy without hydrocephalus. No intracranial mass or abnormal enhancement. 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 Minimal exophthalmos. Cervical medullary junction, pituitary region and pineal region unremarkable. Major intracranial vascular structures are patent with ectatic vertebral arteries and basilar artery. Minimal partial opacification left mastoid air cells. Polypoid opacification left maxillary sinus with minimal mucosal thickening right maxillary sinus and ethmoid sinus air cells. IMPRESSION: No acute infarct or intracranial hemorrhage. Mild small vessel disease type changes. Moderate global atrophy without hydrocephalus. No intracranial mass or abnormal enhancement. 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 Electronically Signed   By: SGenia DelM.D.   On: 02017-10-1313:11    Scheduled Meds: . antiseptic oral rinse  7 mL Mouth Rinse BID  . aspirin  81 mg Oral QHS  . clopidogrel  75 mg Oral Q breakfast  . enoxaparin (LOVENOX) injection  40 mg Subcutaneous Q24H  . escitalopram  20 mg Oral Daily  . insulin aspart  0-15 Units Subcutaneous 6 times per day  . insulin glargine  11 Units Subcutaneous QHS  . levETIRAcetam  380 mg Oral Daily  . levETIRAcetam  750 mg Oral QHS  . levofloxacin (LEVAQUIN) IV  750 mg Intravenous Q48H   . levothyroxine  75 mcg Oral QAC breakfast  . metronidazole  500 mg Intravenous Q8H  . pantoprazole  40 mg Oral Daily  . pravastatin  20 mg Oral QPM  . pregabalin  100 mg Oral QHS  . pregabalin  50 mg Oral Daily  . saccharomyces boulardii  250 mg Oral BID  . sodium chloride  3 mL Intravenous Q12H  . vitamin B-12  1,000 mcg Oral Daily   Continuous Infusions: . sodium chloride 100 mL/hr at [id number]     Time spent: 25 minutes    Corderius Saraceni  [facility] Pager [phone] If 7PM-7AM, please contact night-coverage at [email or web address], password [facility]1/22/2017, 8:52 PM  LOS: 2 days

## 2015-09-30 DIAGNOSIS — R970 Elevated carcinoembryonic antigen [CEA]: Secondary | ICD-10-CM | POA: Diagnosis present

## 2015-09-30 DIAGNOSIS — R978 Other abnormal tumor markers: Secondary | ICD-10-CM | POA: Diagnosis present

## 2015-09-30 DIAGNOSIS — N182 Chronic kidney disease, stage 2 (mild): Secondary | ICD-10-CM | POA: Diagnosis present

## 2015-09-30 LAB — C DIFFICILE QUICK SCREEN W PCR REFLEX
C DIFFICILE (CDIFF) INTERP: NEGATIVE
C DIFFICILE (CDIFF) TOXIN: NEGATIVE
C DIFFICLE (CDIFF) ANTIGEN: NEGATIVE

## 2015-09-30 LAB — GLUCOSE, CAPILLARY
GLUCOSE-CAPILLARY: 38 mg/dL — AB (ref 65–99)
GLUCOSE-CAPILLARY: 186 mg/dL — AB (ref 65–99)
Glucose-Capillary: 115 mg/dL — ABNORMAL HIGH (ref 65–99)
Glucose-Capillary: 239 mg/dL — ABNORMAL HIGH (ref 65–99)
Glucose-Capillary: 139 mg/dL — ABNORMAL HIGH (ref 65–99)
Glucose-Capillary: 183 mg/dL — ABNORMAL HIGH (ref 65–99)

## 2015-09-30 LAB — BASIC METABOLIC PANEL
ANION GAP: 10 (ref 5–15)
BUN: 16 mg/dL (ref 6–20)
CALCIUM: 8.5 mg/dL — AB (ref 8.9–10.3)
CO2: 24 mmol/L (ref 22–32)
CREATININE: 0.9 mg/dL (ref 0.61–1.24)
Chloride: 108 mmol/L (ref 101–111)
GFR calc Af Amer: >60 mL/min (ref >60)
GLUCOSE: 126 mg/dL — AB (ref 65–99)
Potassium: 4.3 mmol/L (ref 3.5–5.1)
Sodium: 142 mmol/L (ref 135–145)

## 2015-09-30 LAB — CORTISOL-AM, BLOOD: Cortisol - AM: 12.7 ug/dL (ref 6.7–22.6)

## 2015-09-30 LAB — CBC
HCT: 28.4 % — ABNORMAL LOW (ref 39.0–52.0)
Hemoglobin: 9.3 g/dL — ABNORMAL LOW (ref 13.0–17.0)
MCH: 30.8 pg (ref 26.0–34.0)
MCHC: 32.7 g/dL (ref 30.0–36.0)
MCV: 94 fL (ref 78.0–100.0)
PLATELETS: 183 10*3/uL (ref 150–400)
RBC: 3.02 MIL/uL — ABNORMAL LOW (ref 4.22–5.81)
RDW: 13.3 % (ref 11.5–15.5)
WBC: 5.3 10*3/uL (ref 4.0–10.5)

## 2015-09-30 LAB — URINE CULTURE: Culture: 10000

## 2015-09-30 MED ORDER — LEVOFLOXACIN IN D5W 750 MG/150ML IV SOLN
750.0000 mg | INTRAVENOUS | Status: DC
  Administered 2015-10-01 – 2015-10-02 (×2): 750 mg via INTRAVENOUS
  Filled 2015-09-30 (×2): qty 150

## 2015-09-30 NOTE — Clinical Social Work Placement (Signed)
   CLINICAL SOCIAL WORK PLACEMENT  NOTE  Date:  09/30/2015  Patient Details  Name: ESPIRIDION CLAEYS MRN: [id number] Date of Birth: 1940-11-04  Clinical Social Work is seeking post-discharge placement for this patient at the Skilled  Nursing Facility level of care (*CSW will initial, date and re-position this form in  chart as items are completed):  Yes   Patient/family provided with [facility] Clinical Social Work Department's list of facilities offering this level of care within the geographic area requested by the patient (or if unable, by the patient's family).  Yes   Patient/family informed of their freedom to choose among providers that offer the needed level of care, that participate in Medicare, Medicaid or managed care program needed by the patient, have an available bed and are willing to accept the patient.  Yes   Patient/family informed of [facility]'s ownership interest in [facility], as well as of the fact that they are under no obligation to receive care at these facilities.  PASRR submitted to EDS on 09/30/15     PASRR number received on 09/30/15     Existing PASRR number confirmed on       FL2 transmitted to all facilities in geographic area requested by pt/family on 09/30/15     FL2 transmitted to all facilities within larger geographic area on       Patient informed that his/her managed care company has contracts with or will negotiate with certain facilities, including the following:            Patient/family informed of bed offers received.  Patient chooses bed at       Physician recommends and patient chooses bed at      Patient to be transferred to   on  .  Patient to be transferred to facility by       Patient family notified on   of transfer.  Name of family member notified:        PHYSICIAN Please sign FL2     Additional Comment:    _______________________________________________ Orson Eva, LCSW 09/30/2015,  11:08 AM

## 2015-09-30 NOTE — Progress Notes (Signed)
CSW continuing to follow.  BSW Intern met with pt at bedside with bed offers from [facility] search. Pt reviewed bed offers. Pt requested BSW Intern to contact his son via telephone regarding bed offers. Pt is agreeable to SNF choice son makes. Pt is hopeful and ready to get into a routine at a SNF. Pt pleased with SW care and responsiveness to help pt.   BSW Intern contacted pt son, [name], via telephone. Pt son requested BSW Intern to e-mail the bed offers to look over. Pt son to notify CSW with SNF decision via e-mail or telephone.   Pt not yet medically ready for dc. CSW continuing to follow.   Harlon Flor, [place] Intern Clinical Social Work Department  [phone]

## 2015-09-30 NOTE — Progress Notes (Signed)
Pharmacy Antibiotic Follow-up Note  Benjamin Hodge is a 75 y.o. year-old male admitted on 09/27/2015.  The patient is currently on day 4 of levaquin for PNA  Assessment/Plan: Increase levaquin dose to 750mg  IV q24h for CrCl > 18mls/min  Temp (24hrs), Avg:97.4 F (36.3 C), Min:97.2 F (36.2 C), Max:97.6 F (36.4 C)   Recent Labs Lab 09/27/15 1543 09/28/15 0511 09/29/15 0450 09/30/15 0420  WBC 7.4 7.0 5.2 5.3    Recent Labs Lab 09/27/15 2110 09/28/15 0110 09/28/15 0511 09/29/15 0450 09/30/15 0420  CREATININE 1.22 1.19 1.24 1.21 0.90   Estimated Creatinine Clearance: 59.6 mL/min (by C-G formula based on Cr of 0.9).    Allergies  Allergen Reactions  . Ace Inhibitors Other (See Comments)    dizzy  . Cymbalta [Duloxetine Hcl] Other (See Comments)    dizzy  . Lipitor [Atorvastatin] Other (See Comments)    Caused pain all over body.  . Bee Venom Swelling  . Glucerna [Nutritional Supplements] Diarrhea  . Angiotensin Receptor Blockers Other (See Comments)    unknown    Antimicrobials this admission: 1/20 >>levaquin >> 1/21 >>flagy for rule out Cdiff  Microbiology results: 1/20 urine:coag neg staph 1/20 MRSA: invalid results 1/21 Cdiff: Neg  Thank you for allowing pharmacy to be a part of this patient's care.  Arley Phenix RPh 09/30/2015, 1:24 PM Pager [phone]

## 2015-09-30 NOTE — Telephone Encounter (Signed)
Spoke to Aguilar at [facility] records review. I confirmed with her that it is ok to send a letter from the physician documenting pt's medical problems and history with physician's concerns regarding driving. In fact, the DMV prefers a letter to accompany the driving review request. I will fax both Dr. Oliva Bustard letter and the medical review request.

## 2015-09-30 NOTE — [place] FL2 (Signed)
[place] MEDICAID FL2 LEVEL OF CARE SCREENING TOOL     IDENTIFICATION  Patient Name: Benjamin Hodge Birthdate: Jan 17, 1941 Sex: male Admission Date (Current Location): 09/27/2015  [facility] and [place] Number:  Producer, television/film/video and Address:  [facility],  [address] [place] [address]      Provider Number: [id number]  Attending Physician Name and Address:  Conley Canal, MD  Relative Name and Phone Number:       Current Level of Care: Hospital Recommended Level of Care: Skilled Nursing Facility Prior Approval Number:    Date Approved/Denied:   PASRR Number: [id number]  Discharge Plan: SNF    Current Diagnoses: Patient Active Problem List   Diagnosis Date Noted  . Hyperglycemia 09/27/2015  . Coma, hyperosmolar nonketotic (HCC) 09/27/2015  . Metabolic encephalopathy 09/27/2015  . COPD with exacerbation (HCC) 09/27/2015  . Acute hyponatremia 09/27/2015  . Narcotic dependency, continuous (HCC) 09/27/2015  . AKI (acute kidney injury) (HCC) 09/27/2015  . DKA (diabetic ketoacidosis) (HCC) 09/14/2015  . Narcotic abuse   . Hyperglycemia without ketosis 09/13/2015  . DKA (diabetic ketoacidoses) (HCC) 09/06/2015  . Partial symptomatic epilepsy with complex partial seizures, not intractable, without status epilepticus (HCC) 07/16/2015  . Dementia arising in the senium and presenium 07/16/2015  . Hypersomnia with long sleep time, idiopathic 07/16/2015  . Pruritus of scalp 05/04/2015  . Loose stools 05/04/2015  . Absence attack (HCC) 05/04/2015  . B12 deficiency 05/04/2015  . Seizures (HCC) 04/10/2015  . Cerebrovascular disease 04/10/2015  . Cellulitis, toe 02/18/2015  . Cellulitis 02/18/2015  . PVD (peripheral vascular disease) (HCC) 02/18/2015  . Testosterone deficiency 01/02/2015  . 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 Right-sided chest wall pain 11/27/2014  . Ecchymosis 08/29/2014  . Anemia- chronic  04/03/2014  . CAD- moderate3V disease at cath 7/15- medical Rx 03/28/2014  . Dyslipidemia 03/13/2014  . Non-STEMI (non-ST elevated myocardial infarction) (HCC) 03/10/2014  . 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 Low back pain potentially associated with spinal stenosis 02/26/2014  . ADHD, adult residual type 05/31/2013  . Elevated AST (SGOT) 01/23/2013  . SHOULDER PAIN, LEFT 03/04/2009  . ANXIETY 07/18/2008  . IRRITABLE BOWEL SYNDROME 04/27/2008  . NOCTURIA 03/13/2008  . COPD (chronic obstructive pulmonary disease) (HCC) 02/27/2008  . Mild nonproliferative diabetic retinopathy (HCC) 11/18/2007  . LEG PAIN, RIGHT 09/29/2007  . POLYNEUROPATHY IN DIABETES 02/09/2007  . HYPOTENSION, ORTHOSTATIC 02/09/2007  . Hypothyroidism 09/27/2006  . Type 1 diabetes mellitus with neurological manifestations, uncontrolled (HCC) 09/27/2006  . ERECTILE DYSFUNCTION 09/27/2006  . Essential hypertension 09/27/2006  . SPINAL STENOSIS, CERVICAL 09/27/2006  . Hypersomnia 09/27/2006    Orientation RESPIRATION BLADDER Height & Weight    Self, Time, Situation, Place  Normal Continent 5\' 7"  (170.2 cm) 125 lbs.  BEHAVIORAL SYMPTOMS/MOOD NEUROLOGICAL BOWEL NUTRITION STATUS   (no behaviors)  (no seizures) Continent Diet (Diet Heart Healthy/Carb Modified)  AMBULATORY STATUS COMMUNICATION OF NEEDS Skin   Limited Assist Verbally Normal                       Personal Care Assistance Level of Assistance  Bathing, Feeding, Dressing Bathing Assistance: Limited assistance Feeding assistance: Independent Dressing Assistance: Limited assistance     Functional Limitations Info  Sight, Hearing, Speech Sight Info: Adequate Hearing Info: Adequate Speech Info: Adequate    SPECIAL CARE FACTORS FREQUENCY  PT (By licensed PT), OT (By licensed OT)  PT Frequency: 5 x a week OT Frequency: 5 x a week            Contractures Contractures Info: Not present     Additional Factors Info  Code Status, Allergies, Insulin Sliding Scale, Isolation Precautions Code Status Info: FULL code status Allergies Info: Ace Inhibitors, Cymbalta, Lipitor, Bee Venom, Glucerna, Angiotensin Receptor Blockers   Insulin Sliding Scale Info: 6 x a day Isolation Precautions Info: Enteric precautions (UV disinfection): rule out CDIFF     Current Medications (09/30/2015):  This is the current hospital active medication list Current Facility-Administered Medications  Medication Dose Route Frequency Provider Last Rate Last Dose  . 0.9 %  sodium chloride infusion   Intravenous Continuous Rolan Lipa, NP 100 mL/hr at 09/30/15 0342    . acetaminophen (TYLENOL) tablet 650 mg  650 mg Oral Q6H PRN Simbiso Ranga, MD       Or  . acetaminophen (TYLENOL) suppository 650 mg  650 mg Rectal Q6H PRN Simbiso Ranga, MD      . albuterol (PROVENTIL) (2.5 MG/3ML) 0.083% nebulizer solution 2.5 mg  2.5 mg Nebulization Q2H PRN Simbiso Ranga, MD      . alum & mag hydroxide-simeth (MAALOX/MYLANTA) 200-200-20 MG/5ML suspension 30 mL  30 mL Oral Q6H PRN Simbiso Ranga, MD      . antiseptic oral rinse (CPC / CETYLPYRIDINIUM CHLORIDE 0.05%) solution 7 mL  7 mL Mouth Rinse BID Rolan Lipa, NP   7 mL at 09/30/15 1017  . aspirin chewable tablet 81 mg  81 mg Oral QHS Simbiso Ranga, MD   81 mg at 09/29/15 2107  . clopidogrel (PLAVIX) tablet 75 mg  75 mg Oral Q breakfast Simbiso Ranga, MD   75 mg at 09/30/15 1025  . enoxaparin (LOVENOX) injection 40 mg  40 mg Subcutaneous Q24H Simbiso Ranga, MD   40 mg at 09/29/15 2108  . escitalopram (LEXAPRO) tablet 20 mg  20 mg Oral Daily Simbiso Ranga, MD   20 mg at 09/30/15 1014  . HYDROcodone-acetaminophen (NORCO/VICODIN) 5-325 MG per tablet 1-2 tablet  1-2 tablet Oral Q4H PRN Conley Canal, MD   1 tablet at 09/30/15 1025  . insulin aspart (novoLOG) injection 0-15 Units  0-15 Units Subcutaneous 6 times per day Conley Canal, MD   5 Units at  09/30/15 1015  . insulin glargine (LANTUS) injection 11 Units  11 Units Subcutaneous QHS Rolan Lipa, NP   11 Units at 09/29/15 2110  . levETIRAcetam (KEPPRA) 100 MG/ML solution 380 mg  380 mg Oral Daily Simbiso Ranga, MD   380 mg at 09/30/15 1015  . levETIRAcetam (KEPPRA) tablet 750 mg  750 mg Oral QHS Simbiso Ranga, MD   750 mg at 09/29/15 2125  . levofloxacin (LEVAQUIN) IVPB 750 mg  750 mg Intravenous Q48H Chales [place], RPH   750 mg at 09/30/15 0000  . levothyroxine (SYNTHROID, LEVOTHROID) tablet 75 mcg  75 mcg Oral QAC breakfast Simbiso Ranga, MD   75 mcg at 09/30/15 1013  . metroNIDAZOLE (FLAGYL) IVPB 500 mg  500 mg Intravenous Q8H Simbiso Ranga, MD   500 mg at 09/30/15 0342  . morphine 2 MG/ML injection 2 mg  2 mg Intravenous Q4H PRN Simbiso Ranga, MD      . ondansetron (ZOFRAN) tablet 4 mg  4 mg Oral Q6H PRN Simbiso Ranga, MD       Or  . ondansetron (ZOFRAN) injection 4 mg  4 mg Intravenous Q6H PRN Conley Canal, MD      .  pantoprazole (PROTONIX) EC tablet 40 mg  40 mg Oral Daily Simbiso Ranga, MD   40 mg at 09/30/15 1014  . polyethylene glycol (MIRALAX / GLYCOLAX) packet 17 g  17 g Oral Daily PRN Simbiso Ranga, MD      . pravastatin (PRAVACHOL) tablet 20 mg  20 mg Oral QPM Simbiso Ranga, MD   20 mg at 09/29/15 1729  . pregabalin (LYRICA) capsule 100 mg  100 mg Oral QHS Rolan Lipa, NP   100 mg at 09/29/15 2107  . pregabalin (LYRICA) capsule 50 mg  50 mg Oral Daily Rolan Lipa, NP   50 mg at 09/30/15 1014  . saccharomyces boulardii (FLORASTOR) capsule 250 mg  250 mg Oral BID Simbiso Ranga, MD   250 mg at 09/30/15 1014  . sodium chloride 0.9 % injection 3 mL  3 mL Intravenous Q12H Simbiso Ranga, MD   3 mL at 09/30/15 1016  . vitamin B-12 (CYANOCOBALAMIN) tablet 1,000 mcg  1,000 mcg Oral Daily Simbiso Ranga, MD   1,000 mcg at 09/30/15 1014     Discharge Medications: Please see discharge summary for a list of discharge medications.  Relevant Imaging  Results:  Relevant Lab Results:   Additional Information SSN: [id number]  Demetra Moya, Selena Lesser A, LCSW

## 2015-09-30 NOTE — Progress Notes (Signed)
Physical Therapy Treatment Patient Details Name: Benjamin Hodge MRN: [id number] DOB: [birth year] Today's Date: 09/30/2015    History of Present Illness 75 y.o. male with PMH of insulin-dependent diabetes mellitus, chronic pain with narcotic dependency, CAD, COPD, and depression who presents to the ED when he was not able to control the sugar with insulin at home, found to have hyperglycemia and confusion. family reports patient is not able to care for self.    PT Comments    Patient reports feeling better  And stronger and that he needs support of the RW for ambulation. Patient will benefit from [facility] for rehab. Patient agreeable.  Follow Up Recommendations  SNF;Supervision/Assistance - 24 hour     Equipment Recommendations  Rolling walker with 5" wheels    Recommendations for Other Services       Precautions / Restrictions Precautions Precautions: Fall    Mobility  Bed Mobility               General bed mobility comments: in recliner  Transfers Overall transfer level: Needs assistance Equipment used: Rolling walker (2 wheeled) Transfers: Sit to/from Stand Sit to Stand: Supervision         General transfer comment: cues for safety.  Ambulation/Gait Ambulation/Gait assistance: Min assist Ambulation Distance (Feet): 200 Feet Assistive device: Rolling walker (2 wheeled) Gait Pattern/deviations: Drifts right/left;Shuffle;Decreased stride length;Trunk flexed Gait velocity: mildly decreased   General Gait Details: cues for posture inside of RW, pushes it  forward.   Stairs            Wheelchair Mobility    Modified Rankin (Stroke Patients Only)       Balance Overall balance assessment: History of Falls Sitting-balance support: No upper extremity supported;Feet supported Sitting balance-Leahy Scale: Good     Standing balance support: No upper extremity supported Standing balance-Leahy Scale: Poor                      Cognition  Arousal/Alertness: Awake/alert   Overall Cognitive Status: Impaired/Different from baseline Area of Impairment: Safety/judgement       Following Commands: Follows multi-step commands with increased time     Problem Solving: Difficulty sequencing, difficulty problem solving, concerned for IV etc and what to do with it to ambulate.      Exercises      General Comments        Pertinent Vitals/Pain Pain Assessment: No/denies pain    Home Living                      Prior Function            PT Goals (current goals can now be found in the care plan section) Progress towards PT goals: Progressing toward goals    Frequency  Min 3X/week    PT Plan Current plan remains appropriate    Co-evaluation             End of Session   Activity Tolerance: Patient tolerated treatment well Patient left: in chair;with call bell/phone within reach;with chair alarm set     Time: 9983-3825 PT Time Calculation (min) (ACUTE ONLY): 18 min  Charges:  $Gait Training: 8-22 mins                    G Codes:      Rada Hay 09/30/2015, 1:58 PM Blanchard Kelch PT [phone]

## 2015-09-30 NOTE — Clinical Documentation Improvement (Addendum)
Hospitalist  (Query responses must be documented in the current medical record, not on the CDI BPA form.)  Query 1 of 2  "Type 1" and "Type 2 DM" are documented in the current medical record.  Please clarify and document which type of DM the patient actually has.  Query 2 of 2  If known or able to determine, please document the STAGE of the patient's CKD.   CKD Stage I - GFR greater than or equal to 90  CKD Stage II - GFR 60-89  CKD Stage III - GFR 30-59  CKD Stage IV - GFR 15-29  CKD Stage V - GFR < 15  ESRD (End Stage Renal Disease)  Other condition  Unable to clinically determine   Please exercise your independent, professional judgment when responding. A specific answer is not anticipated or expected.   Thank You, Jerral Ralph  RN BSN CCDS [phone] Health Information Management 

## 2015-09-30 NOTE — Care Management Important Message (Signed)
Important Message  Patient Details Important Message  Patient Details IM Letter given to Nora/Case Manager to present to Patient Name: Benjamin Hodge MRN: [id number] Date of Birth: June 23, 1941   Medicare Important Message Given:  Yes    Haskell Flirt 09/30/2015, 12:12 PM Name: Benjamin Hodge MRN: [id number] Date of Birth: 1941-02-05   Medicare Important Message Given:  Yes    Haskell Flirt 09/30/2015, 12:12 PM

## 2015-09-30 NOTE — Clinical Social Work Note (Signed)
Clinical Social Work Assessment  Patient Details  Name: Benjamin Hodge MRN: [id number] Date of Birth: 02/05/1941  Date of referral:  09/30/15               Reason for consult:  Discharge Planning                Permission sought to share information with:  Family Supports Permission granted to share information::   (pt agreeable to CSW involving pt son once SNF bed offers available)  Name::     Benjamin Hodge  Agency::     Relationship::  son  Contact Information:  [phone]  Housing/Transportation Living arrangements for the past 2 months:  Single Family Home Source of Information:  Patient Patient Interpreter Needed:  None Criminal Activity/Legal Involvement Pertinent to Current Situation/Hospitalization:  No - Comment as needed Significant Relationships:  Adult Children Lives with:  Self Do you feel safe going back to the place where you live?  No Need for family participation in patient care:  Yes (Comment) (pt agreeable to involving pt son when bed offers available)  Care giving concerns:  Pt admitted from home. Pt granddaughter lives in upstairs of home, but pt essentially lives alone as pt lives in downstairs of home and does not have someone assisting him with daily medications and needs.    Social Worker assessment / plan:  CSW received referral for New SNF.  CSW discussed with pt at bedside. Pt is agreeable to short term rehab at [facility]. Pt expressed eagerness to go to short term rehab to get medications managed and get stronger. CSW discussed process of SNF search and pt agreeable to [facility] search. CSW clarified pt questions and concerns. Pt reports that he has support from pt son and will be agreeable to CSW contacting pt son once SNF bed offers available.   CSW completed FL2 and initiated SNF search to [facility].  CSW to follow up with pt re: SNF bed offers.  CSW to continue to follow to assist with pt discharge planning needs.   Employment  status:  Part-Time Database administrator PT Recommendations:  Skilled Nursing Facility Information / Referral to community resources:  Skilled Nursing Facility  Patient/Family's Response to care:  Pt alert and oriented x 4. Pt states that he wants to develop better habits at rehab at SNF in order to take those habits home following rehab.   Patient/Family's Understanding of and Emotional Response to Diagnosis, Current Treatment, and Prognosis:  Pt was able to discuss his medication needs with this CSW and the things that he feels that he has been lacking doing at home in order to keep as healthy as possible. Pt agreeable to short term rehab.   Emotional Assessment Appearance:  Appears stated age Attitude/Demeanor/Rapport:  Other (pt appropriate) Affect (typically observed):  Appropriate Orientation:  Oriented to Self, Oriented to Place, Oriented to  Time, Oriented to Situation Alcohol / Substance use:  Not Applicable Psych involvement (Current and /or in the community):  No (Comment)  Discharge Needs  Concerns to be addressed:  Discharge Planning Concerns Readmission within the last 30 days:  No Current discharge risk:  Lives alone, Chronically ill Barriers to Discharge:  Continued Medical Work up   Orson Eva, LCSW 09/30/2015, 11:04 AM  [phone]

## 2015-09-30 NOTE — Progress Notes (Signed)
Benjamin Hodge:[id number] DOB: 1941-09-05 DOA: 09/27/2015 PCP: Estill Dooms, MD Assessment&Care Plan Day of admission 09/27/15 Benjamin Hodge is a pleasant 75 year male with multiple medical problems, including Insulin dependent Diabetes Mellitus, poorly controlled HbA1C 12.4, COPD, Essential Htn, Hypothyroidism who comes in with reduced level of consciousness associated with high sugars, and he is found to have Acute Metabolic encephalopathy in setting of nonketotic hyperosmolar hyperglycemia with hyponatremia/acute kidney injury and narcotics. He also has acute exacerbation of COPD. Unfortunately, his history is rather limited as he remains disoriented and there is no family member at bedside. Patient has had multiple admissions in the last month or two and it is apparent that he is failing to cope at home. Today he is not sure if he forgot to take his insulin but he admits to increasing cough. Labs are significant for wbc 7400, bun/cr 43/1.43, sodium 126 glucose 673 mg/dl. UA does not suggest UTI. CXR does not show acute infiltrate. He will be admitted to the Step Down Unit for IVF/glucose stabilizer/empiric antibiotics, and bronchodilators. He will probably require short term rehab placement once clinically stable. His condition is closely guarded. He is full code. Summary&Daily Progress Notes 09/28/15: Patient more with it. Hyperglycemia has resolved-in fact had episode of hypoglycemia earlier today. It appears that sleep hygiene is contributing to the brittle sugars as patient tells me that he goes to bed at 12 midnight at which point he takes Lantus then wakes up after 12:30 in the afternoon(more than 12 hours of sleep). It is conceivable that patient would have relative hypoglycemia related to prolonged period of no food intake, causing sugar readings to give patient false sense of security about sugar control when he eventually wakes up. Likely this would cause him to take a smaller amounts  of short acting insulin when he has his first meal, probably creating a vicious cycle of hyperglycemia/hypoglycemia. Patient tells me that things have changed in the last few months with him losing energy(he attributes this to aging). A contributing factor is likely narcotics which he uses for his lower back. I wonder if anything else has changed-?weight loss etc. His son Benjamin Hodge is concerned whether he is able to cope by himself at home. I have called Benjamin Hodge and we will meet with his father later today and explore options to address the social situation so there is no revolving window of admissions and potential catastrophic outcome at home if things don't change. Meanwhile, will check CEA/CA-19-9, continue Lantus/ssi/Levaquin, replenish magnesium, follow physical therapy/case management and transfer to [facility] when bed becomes available. 1223p: Met with son Benjamin Hodge and conference called with daughter Benjamin Hodge. They say patient is more forgetful lately, sleeps long hours, and probably takes too much pain medication hence not taking meals and insulin consistently. Things changed in the last few months. Question is whether patient has early dementia, had silent CVA, etc. Will therefore obtain brain MRI/Carotid doppler/2D Echo. Returning home does not seem like a safe option at present. 09/29/15: Patient developed diarrhea and hypotension yesterday but responded to IV fluids. There is concern for C. difficile colitis as patient apparently had diarrhea at home out of the norm. He was therefore started on Flagyl pending stool studies which were requested on 09/28/2015. Brain MRI/carotid Doppler/2-D echocardiogram unrevealing. Sugars better controlled, so is her blood pressure. Noted recommendations for skilled nursing facility placement. Appreciate physical therapy. Will continue Levaquin/Flagyl pending septic workup, and request social work input for short-term rehabilitation placement. Patient in agreement with this  plan. 09/29/14: CEA/Ca 19-9 elevated. This is concerning. Will obtain MRCP. C. Difficile pcr negative. D/c Flagyl, continue Levaquin, and check stool hemoccult. Noted plans for SNF. Appreciate case management. 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 Consultants:  None Procedures:  None Antibiotics:  Levaquin 09/27/2015>  Flagyl 09/28/15>09/30/15  HPI/Subjective: No complaints. Diarrhea gone.  Objective: Filed Vitals:   09/29/15 1957 09/30/15 0424  BP: 133/62 163/75  Pulse: 65 67  Temp: 97.2 F (36.2 C) 97.6 F (36.4 C)  Resp: 16 16    Intake/Output Summary (Last 24 hours) at 09/30/15 0930 Last data filed at 09/30/15 0800  Gross per 24 hour  Intake 3396.47 ml  Output   2500 ml  Net 896.47 ml   Filed Weights   09/28/15 0442 09/28/15 1430 09/29/15 0415  Weight: 55.9 kg (123 lb 3.8 oz) 55.877 kg (123 lb 3 oz) 56.8 kg (125 lb 3.5 oz)    Exam:   General:  Comfortable at rest.  Cardiovascular: S1-S2 normal. No murmurs. Pulse regular.  Respiratory: Good air entry bilaterally. No rhonchi or rales.  Abdomen: Soft and nontender. Normal bowel sounds. No organomegaly.  Musculoskeletal: No pedal edema   Neurological: Intact  Data Reviewed: Basic Metabolic Panel:  Recent Labs Lab 09/27/15 2110 09/28/15 0110 09/28/15 0511 09/29/15 0450 09/30/15 0420  NA 138 139 138 139 142  K 4.2 4.2 5.5* 3.7  4.3  CL 105 105 105 110 108  CO2 25 24 24 22 24   GLUCOSE 82 258* 316* 159* 126*  BUN 39* 36* 32* 26* 16  CREATININE 1.22 1.19 1.24 1.21 0.90  CALCIUM 8.9 8.9 8.7* 8.5* 8.5*  MG  --   --  1.6*  --   --   PHOS  --   --  2.9  --   --    Liver Function Tests:  Recent Labs Lab 09/27/15 1543 09/28/15 0511 09/29/15 0450  AST 29 27 24   ALT 39 32 27  ALKPHOS 77 68 68  BILITOT 1.1 0.8 0.2*  PROT 6.2* 5.7* 5.7*  ALBUMIN 3.4* 2.9* 3.1*   No results for input(s): LIPASE, AMYLASE in the last 168 hours. No results for input(s): AMMONIA in the last 168 hours. CBC:  Recent Labs Lab 09/27/15 1543 09/28/15 0511 09/29/15 0450 09/30/15 0420  WBC 7.4 7.0 5.2 5.3  HGB 9.8* 9.5* 10.0* 9.3*  HCT 28.6* 28.5* 30.2* 28.4*  MCV 91.7 92.5 95.6 94.0  PLT 217 216 192 183   Cardiac Enzymes: No results for input(s): CKTOTAL, CKMB, CKMBINDEX, TROPONINI in the last 168 hours. BNP (last 3 results) No results for input(s): BNP in the last 8760 hours.  ProBNP (last 3 results) No results for input(s): PROBNP in the last 8760 hours.  CBG:  Recent Labs Lab 09/29/15 1655 09/29/15 1959 09/29/15 2318 09/30/15 0422 09/30/15 0847  GLUCAP 155* 122* 139* 115* 239*    Recent Results (from the past 240 hour(s))  Urine culture     Status: None (Preliminary result)   Collection Time: 09/27/15  4:57 PM  Result Value Ref Range Status   Specimen Description URINE, CLEAN CATCH  Final   Special Requests NONE  Final   Culture   Final    10,000 COLONIES/mL STAPHYLOCOCCUS SPECIES (COAGULASE NEGATIVE) Performed at [facility]    Report Status PENDING  Incomplete  MRSA PCR Screening     Status: Abnormal   Collection Time: 09/27/15 10:02 PM  Result Value Ref Range Status   MRSA by PCR INVALID RESULTS, SPECIMEN SENT FOR CULTURE (A) NEGATIVE Final    Comment:        The GeneXpert MRSA Assay (FDA approved for NASAL specimens only), is one component of a comprehensive MRSA  colonization surveillance program. It is not intended to diagnose MRSA infection nor to guide or monitor treatment for MRSA infections. Dennis Bast RN [id number] 10-11-15 A NAVARRO   MRSA culture     Status: None   Collection Time: 09/27/15 10:02 PM  Result Value Ref Range Status   Specimen Description NOSE  Final   Special Requests NONE  Final   Culture NOMRSA Performed at [facility]   Final   Report Status 09/29/2015 FINAL  Final     Studies: Mr Kizzie Fantasia Contrast  October 11, 2015  CLINICAL DATA:  76 year old hypertensive diabetic male with hyperlipidemia presenting with reduced level of consciousness. Subsequent encounter. EXAM: MRI HEAD WITHOUT AND WITH CONTRAST TECHNIQUE: Multiplanar, multiecho pulse sequences of the brain and surrounding structures were obtained without and with intravenous contrast. CONTRAST:  29m MULTIHANCE GADOBENATE DIMEGLUMINE 529 MG/ML IV SOLN COMPARISON:  09/27/2015 CT. FINDINGS: No acute infarct or intracranial hemorrhage. Mild small vessel disease type changes. Moderate global atrophy without hydrocephalus. No intracranial mass or abnormal enhancement. 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 Minimal exophthalmos. Cervical medullary junction, pituitary region and pineal region unremarkable. Major intracranial vascular structures are patent with ectatic vertebral arteries and basilar artery. Minimal partial opacification left mastoid air cells. Polypoid opacification left maxillary sinus with minimal mucosal thickening right maxillary sinus and ethmoid sinus air cells. IMPRESSION: No acute infarct or intracranial hemorrhage. Mild small vessel disease type changes. Moderate global atrophy without hydrocephalus. No intracranial mass or abnormal enhancement. 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 Electronically Signed   By: SGenia DelM.D.   On: 0February 03, 201715:11    Scheduled  Meds: . antiseptic oral rinse  7 mL Mouth Rinse BID  . aspirin  81 mg Oral QHS  . clopidogrel  75 mg Oral Q breakfast  . enoxaparin (LOVENOX) injection  40 mg Subcutaneous Q24H  . escitalopram  20 mg Oral Daily  . insulin aspart  0-15 Units Subcutaneous 6 times per day  . insulin glargine  11 Units Subcutaneous QHS  . levETIRAcetam  380 mg Oral Daily  . levETIRAcetam  750 mg Oral QHS  . levofloxacin (LEVAQUIN) IV  750 mg Intravenous Q48H  . levothyroxine  75 mcg Oral QAC breakfast  . metronidazole  500 mg Intravenous Q8H  . pantoprazole  40 mg Oral Daily  . pravastatin  20 mg Oral QPM  . pregabalin  100 mg Oral QHS  . pregabalin  50 mg Oral Daily  . saccharomyces boulardii  250 mg Oral BID  . sodium chloride  3 mL Intravenous Q12H  . vitamin B-12  1,000 mcg Oral Daily   Continuous Infusions: . sodium  chloride 100 mL/hr at 09/30/15 0342     Time spent: 25 minutes    Nil Xiong  [facility] Pager [phone]. If 7PM-7AM, please contact night-coverage at [email or web address], password [facility] 09/30/2015, 9:30 AM  LOS: 3 days

## 2015-10-01 ENCOUNTER — Other Ambulatory Visit: Payer: Self-pay | Admitting: *Deleted

## 2015-10-01 ENCOUNTER — Inpatient Hospital Stay (HOSPITAL_COMMUNITY): Payer: Medicare Other

## 2015-10-01 DIAGNOSIS — R195 Other fecal abnormalities: Secondary | ICD-10-CM | POA: Diagnosis present

## 2015-10-01 LAB — GLUCOSE, CAPILLARY
GLUCOSE-CAPILLARY: 63 mg/dL — AB (ref 65–99)
Glucose-Capillary: 127 mg/dL — ABNORMAL HIGH (ref 65–99)
Glucose-Capillary: 104 mg/dL — ABNORMAL HIGH (ref 65–99)
Glucose-Capillary: 156 mg/dL — ABNORMAL HIGH (ref 65–99)
Glucose-Capillary: 221 mg/dL — ABNORMAL HIGH (ref 65–99)
Glucose-Capillary: 131 mg/dL — ABNORMAL HIGH (ref 65–99)
Glucose-Capillary: 147 mg/dL — ABNORMAL HIGH (ref 65–99)

## 2015-10-01 LAB — BASIC METABOLIC PANEL
Anion gap: 8 (ref 5–15)
BUN: 17 mg/dL (ref 6–20)
CHLORIDE: 110 mmol/L (ref 101–111)
CO2: 26 mmol/L (ref 22–32)
CREATININE: 1.11 mg/dL (ref 0.61–1.24)
Calcium: 8.7 mg/dL — ABNORMAL LOW (ref 8.9–10.3)
GFR calc non Af Amer: >60 mL/min (ref >60)
Glucose, Bld: 118 mg/dL — ABNORMAL HIGH (ref 65–99)
POTASSIUM: 4.2 mmol/L (ref 3.5–5.1)
Sodium: 144 mmol/L (ref 135–145)

## 2015-10-01 LAB — CBC
HEMATOCRIT: 27.2 % — AB (ref 39.0–52.0)
HEMOGLOBIN: 9 g/dL — AB (ref 13.0–17.0)
MCH: 32.3 pg (ref 26.0–34.0)
MCHC: 33.1 g/dL (ref 30.0–36.0)
MCV: 97.5 fL (ref 78.0–100.0)
Platelets: 164 10*3/uL (ref 150–400)
RBC: 2.79 MIL/uL — ABNORMAL LOW (ref 4.22–5.81)
RDW: 13.4 % (ref 11.5–15.5)
WBC: 5.1 10*3/uL (ref 4.0–10.5)

## 2015-10-01 LAB — OCCULT BLOOD X 1 CARD TO LAB, STOOL: Fecal Occult Bld: POSITIVE — AB

## 2015-10-01 MED ORDER — DEXTROSE 50 % IV SOLN
25.0000 mL | Freq: Once | INTRAVENOUS | Status: AC
  Administered 2015-10-01: 25 mL via INTRAVENOUS
  Filled 2015-10-01: qty 50

## 2015-10-01 NOTE — Clinical Documentation Improvement (Addendum)
Hospitalist  (Query responses must be documented in the current medical record, not on the CDI BPA form.)  "Type 1 diabetes mellitus" and "Type 2 diabetes mellitus" are both documented in the current medical record.  To help with accurate code assignment, please clarify and document which type of DM the patient actually has:   - Type 1 DM  - Type 2 DM  - Combined, Type 1 and Type 2 DM   Please exercise your independent, professional judgment when responding. A specific answer is not anticipated or expected.   Thank You, Jerral Ralph  RN BSN CCDS [phone] Health Information Management [place]

## 2015-10-01 NOTE — Progress Notes (Signed)
Dr. Venetia Constable aware of positive occult blood.

## 2015-10-01 NOTE — Progress Notes (Signed)
CSW continuing to follow.   SNF bed offers provided to pt and pt son, Ardelia Mems yesterday.   CSW received notification from pt son, Ardelia Mems that pt and pt family agreeable to [facility].  CSW notified [facility] of pt acceptance of bed offer. Pt family plans to complete admission paperwork today.  CSW discussed with pt at bedside and pt agreeable to plan and eager for rehab to get stronger.   Per RN, pt awaiting MRI which will not be able to be completed until this evening.   CSW updated Joetta Manners.  CSW to continue to follow.   Loletta Specter, MSW, LCSW Clinical Social Work [phone]

## 2015-10-01 NOTE — Progress Notes (Signed)
Benjamin Hodge MBW:[id number] DOB: Mar 25, 1941 DOA: 09/27/2015 PCP: Estill Dooms, MD Assessment&Care Plan Day of admission 09/27/15 Benjamin Hodge is a pleasant 75 year male with multiple medical problems, including Insulin dependent Diabetes Mellitus, poorly controlled HbA1C 12.4, COPD, Essential Htn, Hypothyroidism who comes in with reduced level of consciousness associated with high sugars, and he is found to have Acute Metabolic encephalopathy in setting of nonketotic hyperosmolar hyperglycemia with hyponatremia/acute kidney injury and narcotics. He also has acute exacerbation of COPD. Unfortunately, his history is rather limited as he remains disoriented and there is no family member at bedside. Patient has had multiple admissions in the last month or two and it is apparent that he is failing to cope at home. Today he is not sure if he forgot to take his insulin but he admits to increasing cough. Labs are significant for wbc 7400, bun/cr 43/1.43, sodium 126 glucose 673 mg/dl. UA does not suggest UTI. CXR does not show acute infiltrate. He will be admitted to the Step Down Unit for IVF/glucose stabilizer/empiric antibiotics, and bronchodilators. He will probably require short term rehab placement once clinically stable. His condition is closely guarded. He is full code. Summary&Daily Progress Notes 09/28/15: Patient more with it. Hyperglycemia has resolved-in fact had episode of hypoglycemia earlier today. It appears that sleep hygiene is contributing to the brittle sugars as patient tells me that he goes to bed at 12 midnight at which point he takes Lantus then wakes up after 12:30 in the afternoon(more than 12 hours of sleep). It is conceivable that patient would have relative hypoglycemia related to prolonged period of no food intake, causing sugar readings to give patient false sense of security about sugar control when he eventually wakes up. Likely this would cause him to take a smaller amounts  of short acting insulin when he has his first meal, probably creating a vicious cycle of hyperglycemia/hypoglycemia. Patient tells me that things have changed in the last few months with him losing energy(he attributes this to aging). A contributing factor is likely narcotics which he uses for his lower back. I wonder if anything else has changed-?weight loss etc. His son Benjamin Hodge is concerned whether he is able to cope by himself at home. I have called Benjamin Hodge and we will meet with his father later today and explore options to address the social situation so there is no revolving window of admissions and potential catastrophic outcome at home if things don't change. Meanwhile, will check CEA/CA-19-9, continue Lantus/ssi/Levaquin, replenish magnesium, follow physical therapy/case management and transfer to [facility] when bed becomes available. 1223p: Met with son Benjamin Hodge and conference called with daughter Benjamin Hodge. They say patient is more forgetful lately, sleeps long hours, and probably takes too much pain medication hence not taking meals and insulin consistently. Things changed in the last few months. Question is whether patient has early dementia, had silent CVA, etc. Will therefore obtain brain MRI/Carotid doppler/2D Echo. Returning home does not seem like a safe option at present. 09/29/15: Patient developed diarrhea and hypotension yesterday but responded to IV fluids. There is concern for C. difficile colitis as patient apparently had diarrhea at home out of the norm. He was therefore started on Flagyl pending stool studies which were requested on 09/28/2015. Brain MRI/carotid Doppler/2-D echocardiogram unrevealing. Sugars better controlled, so is her blood pressure. Noted recommendations for skilled nursing facility placement. Appreciate physical therapy. Will continue Levaquin/Flagyl pending septic workup, and request social work input for short-term rehabilitation placement. Patient in agreement with this  plan. 09/29/14: CEA/Ca 19-9 elevated. This is concerning. Will obtain MRCP. C. Difficile pcr negative. D/c Flagyl, continue Levaquin, and check stool hemoccult. Noted plans for SNF. Appreciate case management. 10/01/15: Stool Hemoccult positive. Await MRCP. Consulted  GI to consider endoscopy. Patient to eventually transition to skilled nursing facility-?[facility]. He hasn't been able to cope at home in the last few months and it is not clear what changed-wide for malignancy, volatile insulin control lately, excessive sleep-more than 12 hours daily after taking long acting insulin. Will discontinue Lovenox in view of process to stool Hemoccult, continue Levaquin to complete course for COPD exacerbation, follow MRCP, follow GI recommendations and plan for discharge to skilled nursing facility when medically ready. Patient otherwise feels great. 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 Consultants:  None Procedures:  None Antibiotics:  Levaquin 09/27/2015>  Flagyl 09/28/15>09/30/15   HPI/Subjective: Feels okay, no complaints. He says this is the best he has felt in days.  Objective: Filed Vitals:    10/01/15 0500 10/01/15 1337  BP: 153/72 124/62  Pulse: 73 76  Temp: 97.6 F (36.4 C) 97.4 F (36.3 C)  Resp: 20 16    Intake/Output Summary (Last 24 hours) at 10/01/15 2050 Last data filed at 10/01/15 1400  Gross per 24 hour  Intake   1420 ml  Output   1750 ml  Net   -330 ml   Filed Weights   09/28/15 1430 09/29/15 0415 10/01/15 0500  Weight: 55.877 kg (123 lb 3 oz) 56.8 kg (125 lb 3.5 oz) 58.6 kg (129 lb 3 oz)    Exam:   General:  Comfortable at rest.  Cardiovascular: S1-S2 normal. No murmurs. Pulse regular.  Respiratory: Good air entry bilaterally. No rhonchi or rales.  Abdomen: Soft and nontender. Normal bowel sounds. No organomegaly.  Musculoskeletal: No pedal edema   Neurological: Intact  Data Reviewed: Basic Metabolic Panel:  Recent Labs Lab 09/28/15 0110 09/28/15 0511 09/29/15 0450 09/30/15 0420 10/01/15 0414  NA 139 138 139 142 144  K 4.2 5.5* 3.7 4.3 4.2  CL 105 105 110 108 110  CO2 24 24 22 24 26   GLUCOSE 258* 316* 159* 126* 118*  BUN 36* 32* 26* 16 17  CREATININE 1.19 1.24 1.21 0.90 1.11  CALCIUM 8.9 8.7* 8.5* 8.5* 8.7*  MG  --  1.6*  --   --   --   PHOS  --  2.9  --   --   --    Liver Function Tests:  Recent Labs Lab 09/27/15 1543 09/28/15 0511 09/29/15 0450  AST 29 27 24   ALT 39 32 27  ALKPHOS 77 68 68  BILITOT 1.1 0.8 0.2*  PROT 6.2* 5.7* 5.7*  ALBUMIN 3.4* 2.9* 3.1*   No results for input(s): LIPASE, AMYLASE in the last 168 hours. No results for input(s): AMMONIA in the last 168 hours. CBC:  Recent Labs Lab 09/27/15 1543 09/28/15 0511 09/29/15 0450 09/30/15 0420 10/01/15 0414  WBC 7.4 7.0 5.2 5.3 5.1  HGB 9.8* 9.5* 10.0* 9.3* 9.0*  HCT 28.6* 28.5* 30.2* 28.4* 27.2*  MCV 91.7 92.5 95.6 94.0 97.5  PLT 217 216 192 183 164   Cardiac Enzymes: No results for input(s): CKTOTAL, CKMB, CKMBINDEX, TROPONINI in the last 168 hours. BNP (last 3 results) No results for input(s): BNP in the last 8760 hours.  ProBNP (last 3  results) No results for input(s): PROBNP in the last 8760 hours.  CBG:  Recent Labs Lab 09/30/15 2109 10/01/15 0055 10/01/15 0408 10/01/15 0805 10/01/15 1204  GLUCAP 183* 127* 104* 156* 221*    Recent Results (from the past 240 hour(s))  Urine culture     Status: None   Collection Time: 09/27/15  4:57 PM  Result Value Ref Range Status   Specimen Description URINE, CLEAN CATCH  Final   Special Requests NONE  Final   Culture   Final    10,000 COLONIES/mL STAPHYLOCOCCUS SPECIES (COAGULASE NEGATIVE) Performed at [facility]    Report Status 09/30/2015 FINAL  Final   Organism ID, Bacteria STAPHYLOCOCCUS SPECIES (COAGULASE NEGATIVE)  Final      Susceptibility   Staphylococcus species (coagulase negative) - MIC*    CIPROFLOXACIN >=8 RESISTANT Resistant     GENTAMICIN <=0.5 SENSITIVE Sensitive     NITROFURANTOIN <=16 SENSITIVE Sensitive     OXACILLIN >=4 RESISTANT Resistant     TETRACYCLINE >=16 RESISTANT Resistant     VANCOMYCIN 2 SENSITIVE Sensitive     TRIMETH/SULFA <=10 SENSITIVE Sensitive     CLINDAMYCIN >=8 RESISTANT Resistant     RIFAMPIN <=0.5 SENSITIVE Sensitive     Inducible Clindamycin NEGATIVE Sensitive     * 10,000 COLONIES/mL STAPHYLOCOCCUS SPECIES (COAGULASE NEGATIVE)  MRSA PCR Screening     Status: Abnormal   Collection Time: 09/27/15 10:02 PM  Result Value Ref Range Status   MRSA by PCR INVALID RESULTS, SPECIMEN SENT FOR CULTURE (A) NEGATIVE Final    Comment:        The GeneXpert MRSA Assay (FDA approved for NASAL specimens only), is one component of a comprehensive MRSA colonization surveillance program. It is not intended to diagnose MRSA infection nor to guide or monitor treatment for MRSA infections. Dennis Bast RN 0630 09/28/15 A NAVARRO   MRSA culture     Status: None   Collection Time: 09/27/15 10:02 PM  Result Value Ref Range Status   Specimen Description NOSE  Final   Special Requests NONE  Final   Culture NOMRSA Performed at  [facility]   Final   Report Status 09/29/2015 FINAL  Final  C difficile quick scan w PCR reflex     Status: None   Collection Time: 09/30/15  8:38 AM  Result Value Ref Range Status   C Diff antigen NEGATIVE NEGATIVE Final   C Diff toxin NEGATIVE NEGATIVE Final   C Diff interpretation Negative for toxigenic C. difficile  Final     Studies: No results found.  Scheduled Meds: . antiseptic oral rinse  7 mL Mouth Rinse BID  . aspirin  81 mg Oral QHS  . clopidogrel  75 mg Oral Q breakfast  . escitalopram  20 mg Oral Daily  .  insulin aspart  0-15 Units Subcutaneous 6 times per day  . insulin glargine  11 Units Subcutaneous QHS  . levETIRAcetam  380 mg Oral Daily  . levETIRAcetam  750 mg Oral QHS  . levofloxacin (LEVAQUIN) IV  750 mg Intravenous Q24H  . levothyroxine  75 mcg Oral QAC breakfast  . pantoprazole  40 mg Oral Daily  . pravastatin  20 mg Oral QPM  . pregabalin  100 mg Oral QHS  . pregabalin  50 mg Oral Daily  . saccharomyces boulardii  250 mg Oral BID  . sodium chloride  3 mL Intravenous Q12H  . vitamin B-12  1,000 mcg Oral Daily   Continuous Infusions: . sodium chloride 1,000 mL (10/01/15 1209)     Time spent: 25 minutes    Allyssa Abruzzese  [facility] Pager [phone]. If 7PM-7AM, please contact night-coverage at [email or web address], password [facility] 10/01/2015, 8:50 PM  LOS: 4 days

## 2015-10-01 NOTE — Clinical Social Work Placement (Signed)
   CLINICAL SOCIAL WORK PLACEMENT  NOTE  Date:  10/01/2015  Patient Details  Name: Benjamin Hodge MRN: [id number] Date of Birth: 1941/06/19  Clinical Social Work is seeking post-discharge placement for this patient at the Skilled  Nursing Facility level of care (*CSW will initial, date and re-position this form in  chart as items are completed):  Yes   Patient/family provided with [facility] Clinical Social Work Department's list of facilities offering this level of care within the geographic area requested by the patient (or if unable, by the patient's family).  Yes   Patient/family informed of their freedom to choose among providers that offer the needed level of care, that participate in Medicare, Medicaid or managed care program needed by the patient, have an available bed and are willing to accept the patient.  Yes   Patient/family informed of [facility]'s ownership interest in [facility] and [facility], as well as of the fact that they are under no obligation to receive care at these facilities.  PASRR submitted to EDS on 09/30/15     PASRR number received on 09/30/15     Existing PASRR number confirmed on       FL2 transmitted to all facilities in geographic area requested by pt/family on 09/30/15     FL2 transmitted to all facilities within larger geographic area on       Patient informed that his/her managed care company has contracts with or will negotiate with certain facilities, including the following:        Yes   Patient/family informed of bed offers received.  Patient chooses bed at [facility]     Physician recommends and patient chooses bed at      Patient to be transferred to [facility] on  .  Patient to be transferred to facility by       Patient family notified on   of transfer.  Name of family member notified:        PHYSICIAN Please sign FL2     Additional Comment:     _______________________________________________ Orson Eva, LCSW 10/01/2015, 2:16 PM
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
## 2015-10-02 ENCOUNTER — Inpatient Hospital Stay (HOSPITAL_COMMUNITY): Payer: Medicare Other

## 2015-10-02 ENCOUNTER — Other Ambulatory Visit: Payer: Self-pay | Admitting: Physician Assistant

## 2015-10-02 DIAGNOSIS — E1065 Type 1 diabetes mellitus with hyperglycemia: Principal | ICD-10-CM

## 2015-10-02 DIAGNOSIS — D638 Anemia in other chronic diseases classified elsewhere: Secondary | ICD-10-CM

## 2015-10-02 DIAGNOSIS — R970 Elevated carcinoembryonic antigen [CEA]: Secondary | ICD-10-CM

## 2015-10-02 DIAGNOSIS — E1043 Type 1 diabetes mellitus with diabetic autonomic (poly)neuropathy: Secondary | ICD-10-CM

## 2015-10-02 DIAGNOSIS — J441 Chronic obstructive pulmonary disease with (acute) exacerbation: Secondary | ICD-10-CM

## 2015-10-02 DIAGNOSIS — R978 Other abnormal tumor markers: Secondary | ICD-10-CM

## 2015-10-02 DIAGNOSIS — M545 Low back pain: Secondary | ICD-10-CM

## 2015-10-02 DIAGNOSIS — R195 Other fecal abnormalities: Secondary | ICD-10-CM

## 2015-10-02 DIAGNOSIS — E039 Hypothyroidism, unspecified: Secondary | ICD-10-CM

## 2015-10-02 DIAGNOSIS — G9341 Metabolic encephalopathy: Secondary | ICD-10-CM

## 2015-10-02 LAB — GLUCOSE, CAPILLARY
GLUCOSE-CAPILLARY: 112 mg/dL — AB (ref 65–99)
GLUCOSE-CAPILLARY: 221 mg/dL — AB (ref 65–99)
GLUCOSE-CAPILLARY: 27 mg/dL — AB (ref 65–99)
GLUCOSE-CAPILLARY: 423 mg/dL — AB (ref 65–99)
Glucose-Capillary: 186 mg/dL — ABNORMAL HIGH (ref 65–99)
Glucose-Capillary: 256 mg/dL — ABNORMAL HIGH (ref 65–99)

## 2015-10-02 LAB — BASIC METABOLIC PANEL
Anion gap: 7 (ref 5–15)
BUN: 18 mg/dL (ref 6–20)
CALCIUM: 8.9 mg/dL (ref 8.9–10.3)
CO2: 26 mmol/L (ref 22–32)
CREATININE: 0.94 mg/dL (ref 0.61–1.24)
Chloride: 107 mmol/L (ref 101–111)
GFR calc Af Amer: >60 mL/min (ref >60)
Glucose, Bld: 225 mg/dL — ABNORMAL HIGH (ref 65–99)
Potassium: 4.3 mmol/L (ref 3.5–5.1)
SODIUM: 140 mmol/L (ref 135–145)

## 2015-10-02 LAB — CBC
HCT: 29 % — ABNORMAL LOW (ref 39.0–52.0)
Hemoglobin: 9.4 g/dL — ABNORMAL LOW (ref 13.0–17.0)
MCH: 31.5 pg (ref 26.0–34.0)
MCHC: 32.4 g/dL (ref 30.0–36.0)
MCV: 97.3 fL (ref 78.0–100.0)
PLATELETS: 193 10*3/uL (ref 150–400)
RBC: 2.98 MIL/uL — AB (ref 4.22–5.81)
RDW: 13.3 % (ref 11.5–15.5)
WBC: 5.6 10*3/uL (ref 4.0–10.5)

## 2015-10-02 LAB — GLUCOSE, RANDOM: Glucose, Bld: 463 mg/dL — ABNORMAL HIGH (ref 65–99)

## 2015-10-02 MED ORDER — INSULIN ASPART 100 UNIT/ML ~~LOC~~ SOLN
18.0000 [IU] | Freq: Once | SUBCUTANEOUS | Status: AC
  Administered 2015-10-02: 18 [IU] via SUBCUTANEOUS

## 2015-10-02 MED ORDER — LEVOFLOXACIN 750 MG PO TABS
750.0000 mg | ORAL_TABLET | Freq: Every day | ORAL | Status: DC
  Administered 2015-10-02 – 2015-10-03 (×2): 750 mg via ORAL
  Filled 2015-10-02 (×2): qty 1

## 2015-10-02 MED ORDER — DEXTROSE 50 % IV SOLN
INTRAVENOUS | Status: AC
  Filled 2015-10-02: qty 50

## 2015-10-02 MED ORDER — TIOTROPIUM BROMIDE MONOHYDRATE 18 MCG IN CAPS
18.0000 ug | ORAL_CAPSULE | Freq: Every day | RESPIRATORY_TRACT | Status: DC
  Administered 2015-10-03: 18 ug via RESPIRATORY_TRACT
  Filled 2015-10-02: qty 5

## 2015-10-02 MED ORDER — INSULIN ASPART 100 UNIT/ML ~~LOC~~ SOLN
3.0000 [IU] | Freq: Three times a day (TID) | SUBCUTANEOUS | Status: DC
  Administered 2015-10-03 (×2): 3 [IU] via SUBCUTANEOUS

## 2015-10-02 MED ORDER — DEXTROSE 50 % IV SOLN
25.0000 mL | Freq: Once | INTRAVENOUS | Status: AC
  Administered 2015-10-02: 25 mL via INTRAVENOUS

## 2015-10-02 MED ORDER — BUDESONIDE-FORMOTEROL FUMARATE 160-4.5 MCG/ACT IN AERO
2.0000 | INHALATION_SPRAY | Freq: Two times a day (BID) | RESPIRATORY_TRACT | Status: DC
  Administered 2015-10-02 – 2015-10-03 (×2): 2 via RESPIRATORY_TRACT
  Filled 2015-10-02: qty 6

## 2015-10-02 MED ORDER — GADOBENATE DIMEGLUMINE 529 MG/ML IV SOLN
15.0000 mL | Freq: Once | INTRAVENOUS | Status: AC | PRN
  Administered 2015-10-02: 11 mL via INTRAVENOUS

## 2015-10-02 NOTE — Progress Notes (Signed)
Patient CBG at  Around 1143 down to 27,patient appears pale when I went to his room,verbalized weakness and was confused. Patient was given orange juice,and also D50 25 ml per hypoglycemic protocol. Dr. Janee Morn aware of this event. CBG rechecked at around 1200, result was 112, patient appears better,alert and oriented x4, no confusion noted. Will continue to monitor.

## 2015-10-02 NOTE — Progress Notes (Signed)
Pharmacy Antibiotic Follow-up Note  Benjamin Hodge is a 75 y.o. year-old male admitted on 09/27/2015.  The patient is currently on day 6 of levaquin for AECOPD vs PNA,  Assessment/Plan: Improved renal fxn. Continue levaquin PO 750mg  IV q24h for CrCl > 27mls/min.  F/u duration of therapy of antibiotics.   Temp (24hrs), Avg:97.5 F (36.4 C), Min:97.4 F (36.3 C), Max:97.6 F (36.4 C)   Recent Labs Lab 09/28/15 0511 09/29/15 0450 09/30/15 0420 10/01/15 0414 10/02/15 0405  WBC 7.0 5.2 5.3 5.1 5.6     Recent Labs Lab 09/28/15 0511 09/29/15 0450 09/30/15 0420 10/01/15 0414 10/02/15 0405  CREATININE 1.24 1.21 0.90 1.11 0.94   Estimated Creatinine Clearance: 57.4 mL/min (by C-G formula based on Cr of 0.94).    Allergies  Allergen Reactions  . Ace Inhibitors Other (See Comments)    dizzy  . Cymbalta [Duloxetine Hcl] Other (See Comments)    dizzy  . Lipitor [Atorvastatin] Other (See Comments)    Caused pain all over body.  . Bee Venom Swelling  . Glucerna [Nutritional Supplements] Diarrhea  . Angiotensin Receptor Blockers Other (See Comments)    unknown    Antimicrobials this admission: 1/20 >>levaquin >> 1/21 >>flagy for rule out Cdiff  Microbiology results: 1/20 urine:coag neg staph 1/20 MRSA: invalid results 1/21 Cdiff: Neg  Thank you for allowing pharmacy to be a part of this patient's care.  Haynes Hoehn, PharmD, BCPS 10/02/2015, 1:17 PM  Pager: [phone]

## 2015-10-02 NOTE — Progress Notes (Signed)
[facility] PROGRESS NOTE  TAREQ FERRAR LFY:[id number] DOB: 01/24/1941 DOA: 09/27/2015 PCP: Kimber Relic, MD  Brief interval history Jamarea Kalafut is a pleasant 75 year male with multiple medical problems, including Insulin dependent Diabetes Mellitus, poorly controlled HbA1C 12.4, COPD, Essential Htn, Hypothyroidism who comes in with reduced level of consciousness associated with high sugars, and he is found to have Acute Metabolic encephalopathy in setting of nonketotic hyperosmolar hyperglycemia with hyponatremia/acute kidney injury and narcotics. He also has acute exacerbation of COPD. Unfortunately, his history is rather limited as he remains disoriented and there is no family member at bedside. Patient has had multiple admissions in the last month or two and it is apparent that he is failing to cope at home. Today he is not sure if he forgot to take his insulin but he admits to increasing cough. Labs are significant for wbc 7400, bun/cr 43/1.43, sodium 126 glucose 673 mg/dl. UA does not suggest UTI. CXR does not show acute infiltrate. He will be admitted to the Step Down Unit for IVF/glucose stabilizer/empiric antibiotics, and bronchodilators. He will probably require short term rehab placement once clinically stable. His condition is closely guarded. He is full code.   Assessment/Plan: #1 hyperglycemia and type 1 diabetes/type 1 diabetes with peripheral neuropathy  Questionable etiology. Patient with no signs or symptoms of infection. Chest x-ray was negative for any acute infiltrate. Urinalysis did not suggest a UTI. Patient noted to have a A1c of 12.4 and poorly controlled insulin-dependent type 1 diabetes mellitus. Patient with poor sleep hygiene which likely contributed to his brittle diabetes. Patient stated to Dr. Herbie Drape that he usually goes to bed around midnight when he takes his Lantus and then wakes up around 12:30 in the afternoon more than 12 hours of sleep and could  potentially be going through a relative hypoglycemia related. Due to no food intake with readings given patient is false sense of security about sugar control when he eventually wakes up. Likely causing patient to take short amounts of insulin with his first meal results in a vicious cycle of hyper and hypoglycemia. Patient was placed on the glucose stabilizer and has currently been transitioned to subcutaneous insulin. CBGs have ranged from 27-256.  patient had been nothing by mouth prior to MRI and a such CBGs were 27. Patient given some D50, orange juice, crackers with improvement with his CBGs. Continue current Lantus. Continue sliding scale insulin. Follow.  #2 elevated CEA/ CA19-9 Patient noted to have a positive FOBT. MRCP abdomen and pelvis pending. GI consultation pending. Follow. Will hold Plavix for now while GI evaluation is pending.  #3 COPD Stable. Improved. Patient on IV Levaquin will change to oral Levaquin. Placed on Spiriva and Symbicort. Nebs as needed.  #4 acute metabolic encephalopathy Likely secondary to problem #1. Patient with no signs or symptoms of infection. MRI head was negative for any acute abnormalities. Carotid Dopplers with no significant ICA stenosis. 2-D echo with EF of 55-60% with no wall motion abnormalities, grade 1 diastolic dysfunction. Patient with some clinical improvement and likely at baseline. Follow.  #5 hypothyroidism TSH was 0.878 on 09/06/2015. Continue Synthroid. Outpatient follow-up.  #6 chronic low back pain Continue pain management  #7 prophylaxis PPI for GI prophylaxis. SCDs for DVT prophylaxis.   Code Status: Full Family Communication: Updated patient. No family at bedside. Disposition Plan: Likely skilled nursing facility when medically stable.   Consultants:  Gastroenterology pending  Procedures:  MRCP 10/02/2015 pending  CT head 09/27/2015  Chest x-ray  09/27/2015  2-D echo 09/29/2015  MRI head 09/28/2015  Carotid  Dopplers 09/28/2015  Antibiotics:  IV Levaquin 09/27/2015  IV Flagyl 09/28/2015>>>>>> 10/01/2015  HPI/Subjective: Patient patient denies any chest pain. Patient states he feels his breath is short. No nausea no emesis. No overt bleeding. No wheezing. Per nursing patient noted to have a CBG of 27 after coming back from MRCP. Patient alert and following commands and eating a cracker.  Objective: Filed Vitals:   10/01/15 2055 10/02/15 0520  BP: 151/75 157/64  Pulse: 67 71  Temp: 97.6 F (36.4 C) 97.4 F (36.3 C)  Resp: 16 16    Intake/Output Summary (Last 24 hours) at 10/02/15 1158 Last data filed at 10/02/15 1116  Gross per 24 hour  Intake    940 ml  Output   2450 ml  Net  -1510 ml   Filed Weights   09/29/15 0415 10/01/15 0500 10/02/15 0520  Weight: 56.8 kg (125 lb 3.5 oz) 58.6 kg (129 lb 3 oz) 57.1 kg (125 lb 14.1 oz)    Exam:   General:  NAD  Cardiovascular: RRR  Respiratory: CTAB  Abdomen: Soft, nontender, nondistended, positive bowel sounds.  Musculoskeletal: No clubbing cyanosis or edema.  Data Reviewed: Basic Metabolic Panel:  Recent Labs Lab 09/28/15 0511 09/29/15 0450 09/30/15 0420 10/01/15 0414 10/02/15 0405  NA 138 139 142 144 140  K 5.5* 3.7 4.3 4.2 4.3  CL 105 110 108 110 107  CO2 24 22 24 26 26   GLUCOSE 316* 159* 126* 118* 225*  BUN 32* 26* 16 17 18   CREATININE 1.24 1.21 0.90 1.11 0.94  CALCIUM 8.7* 8.5* 8.5* 8.7* 8.9  MG 1.6*  --   --   --   --   PHOS 2.9  --   --   --   --    Liver Function Tests:  Recent Labs Lab 09/27/15 1543 09/28/15 0511 09/29/15 0450  AST 29 27 24   ALT 39 32 27  ALKPHOS 77 68 68  BILITOT 1.1 0.8 0.2*  PROT 6.2* 5.7* 5.7*  ALBUMIN 3.4* 2.9* 3.1*   No results for input(s): LIPASE, AMYLASE in the last 168 hours. No results for input(s): AMMONIA in the last 168 hours. CBC:  Recent Labs Lab 09/28/15 0511 09/29/15 0450 09/30/15 0420 10/01/15 0414 10/02/15 0405  WBC 7.0 5.2 5.3 5.1 5.6  HGB 9.5*  10.0* 9.3* 9.0* 9.4*  HCT 28.5* 30.2* 28.4* 27.2* 29.0*  MCV 92.5 95.6 94.0 97.5 97.3  PLT 216 192 183 164 193   Cardiac Enzymes: No results for input(s): CKTOTAL, CKMB, CKMBINDEX, TROPONINI in the last 168 hours. BNP (last 3 results) No results for input(s): BNP in the last 8760 hours.  ProBNP (last 3 results) No results for input(s): PROBNP in the last 8760 hours.  CBG:  Recent Labs Lab 10/01/15 2018 10/01/15 2138 10/02/15 0020 10/02/15 0421 10/02/15 0743  GLUCAP 63* 147* 221* 186* 256*    Recent Results (from the past 240 hour(s))  Urine culture     Status: None   Collection Time: 09/27/15  4:57 PM  Result Value Ref Range Status   Specimen Description URINE, CLEAN CATCH  Final   Special Requests NONE  Final   Culture   Final    10,000 COLONIES/mL STAPHYLOCOCCUS SPECIES (COAGULASE NEGATIVE) Performed at [facility]    Report Status 09/30/2015 FINAL  Final   Organism ID, Bacteria STAPHYLOCOCCUS SPECIES (COAGULASE NEGATIVE)  Final      Susceptibility   Staphylococcus  species (coagulase negative) - MIC*    CIPROFLOXACIN >=8 RESISTANT Resistant     GENTAMICIN <=0.5 SENSITIVE Sensitive     NITROFURANTOIN <=16 SENSITIVE Sensitive     OXACILLIN >=4 RESISTANT Resistant     TETRACYCLINE >=16 RESISTANT Resistant     VANCOMYCIN 2 SENSITIVE Sensitive     TRIMETH/SULFA <=10 SENSITIVE Sensitive     CLINDAMYCIN >=8 RESISTANT Resistant     RIFAMPIN <=0.5 SENSITIVE Sensitive     Inducible Clindamycin NEGATIVE Sensitive     * 10,000 COLONIES/mL STAPHYLOCOCCUS SPECIES (COAGULASE NEGATIVE)  MRSA PCR Screening     Status: Abnormal   Collection Time: 09/27/15 10:02 PM  Result Value Ref Range Status   MRSA by PCR INVALID RESULTS, SPECIMEN SENT FOR CULTURE (A) NEGATIVE Final    Comment:        The GeneXpert MRSA Assay (FDA approved for NASAL specimens only), is one component of a comprehensive MRSA colonization surveillance program. It is not intended to diagnose  MRSA infection nor to guide or monitor treatment for MRSA infections. Brayton Layman RN 1610 09/28/15 A NAVARRO   MRSA culture     Status: None   Collection Time: 09/27/15 10:02 PM  Result Value Ref Range Status   Specimen Description NOSE  Final   Special Requests NONE  Final   Culture NOMRSA Performed at [facility]   Final   Report Status 09/29/2015 FINAL  Final  C difficile quick scan w PCR reflex     Status: None   Collection Time: 09/30/15  8:38 AM  Result Value Ref Range Status   C Diff antigen NEGATIVE NEGATIVE Final   C Diff toxin NEGATIVE NEGATIVE Final   C Diff interpretation Negative for toxigenic C. difficile  Final     Studies: Mr 3d Recon At Scanner  10-08-2015  CLINICAL DATA:  Weight loss. Hypertension. Hyperlipidemia. CEA and CA 19 elevation. Chronic kidney disease. EXAM: MRI ABDOMEN WITHOUT AND WITH CONTRAST (INCLUDING MRCP) TECHNIQUE: Multiplanar multisequence MR imaging of the abdomen was performed both before and after the administration of intravenous contrast. Heavily T2-weighted images of the biliary and pancreatic ducts were obtained, and three-dimensional MRCP images were rendered by post processing. CONTRAST:  11mL MULTIHANCE GADOBENATE DIMEGLUMINE 529 MG/ML IV SOLN COMPARISON:  Aortic ultrasound of 03/11/2014.  No prior CT or MRI. FINDINGS: Mild motion degradation throughout. Lower chest: Small left pleural effusion. Fluid level in a distal esophagus, including on image 7/ series 3. Hepatobiliary: Normal liver. Normal gallbladder. No intra or extrahepatic biliary duct dilatation. Normal common duct, 5 mm on image 44/series 4. Periampullary duodenal diverticulum. No evidence of obstructive mass or stone. Pancreas: Mild pancreatic atrophy, without mass or duct dilatation, given mild motion degradation. Spleen: Normal in size, without focal abnormality. Adrenals/Urinary Tract: Normal adrenal glands. Normal left kidney. Posterior interpolar right renal cyst  of 11 mm. Anterior interpolar right renal lesion demonstrates T2 hypointensity on image 27/ series 3 and measures 1.7 x 1.6 cm on image 47 of series 96045. Precontrast hypointense relative to the remainder of the kidney, with mild post-contrast enhancement, including on image 51/1105. No hydronephrosis. Stomach/Bowel: Normal stomach and abdominal bowel loops. Vascular/Lymphatic: Patent right renal vein. Aortic and branch vessel atherosclerosis. No retroperitoneal or retrocrural adenopathy. Other: No ascites. Musculoskeletal: No acute osseous abnormality. IMPRESSION: 1. Motion degraded exam. 2. No explanation for weight loss. Normal appearance of the liver and biliary tree. 3. 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 Lipid poor  angiomyolipoma could look similar. 4. Trace left pleural fluid. 5. Esophageal air fluid level suggests dysmotility or gastroesophageal reflux. These results will be called to the ordering clinician or representative by the Radiologist Assistant, and communication documented in the PACS or zVision Dashboard. Electronically Signed   By: Jeronimo Greaves M.D.   On: 10/02/2015 11:48   Mr Abd W/wo Cm/mrcp  10/02/2015  CLINICAL DATA:  Weight loss. Hypertension. Hyperlipidemia. CEA and CA 19 elevation. Chronic kidney disease. EXAM: MRI ABDOMEN WITHOUT AND WITH CONTRAST (INCLUDING MRCP) TECHNIQUE: Multiplanar multisequence MR imaging of the abdomen was performed both before and after the administration of intravenous contrast. Heavily T2-weighted images of the biliary and pancreatic ducts were obtained, and three-dimensional MRCP images were rendered by post processing. CONTRAST:  11mL MULTIHANCE GADOBENATE DIMEGLUMINE 529 MG/ML IV SOLN COMPARISON:  Aortic ultrasound of 03/11/2014.  No prior CT or MRI. FINDINGS: Mild motion degradation throughout. Lower chest: Small left pleural effusion. Fluid level in a distal esophagus, including on image 7/ series  3. Hepatobiliary: Normal liver. Normal gallbladder. No intra or extrahepatic biliary duct dilatation. Normal common duct, 5 mm on image 44/series 4. Periampullary duodenal diverticulum. No evidence of obstructive mass or stone. Pancreas: Mild pancreatic atrophy, without mass or duct dilatation, given mild motion degradation. Spleen: Normal in size, without focal abnormality. Adrenals/Urinary Tract: Normal adrenal glands. Normal left kidney. Posterior interpolar right renal cyst of 11 mm. Anterior interpolar right renal lesion demonstrates T2 hypointensity on image 27/ series 3 and measures 1.7 x 1.6 cm on image 47 of series 40981. Precontrast hypointense relative to the remainder of the kidney, with mild post-contrast enhancement, including on image 51/1105. No hydronephrosis. Stomach/Bowel: Normal stomach and abdominal bowel loops. Vascular/Lymphatic: Patent right renal vein. Aortic and branch vessel atherosclerosis. No retroperitoneal or retrocrural adenopathy. Other: No ascites. Musculoskeletal: No acute osseous abnormality. IMPRESSION: 1. Motion degraded exam. 2. No explanation for weight loss. Normal appearance of the liver and biliary tree. 3. 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 Lipid poor angiomyolipoma could look similar. 4. Trace left pleural fluid. 5. Esophageal air fluid level suggests dysmotility or gastroesophageal reflux. These results will be called to the ordering clinician or representative by the Radiologist Assistant, and communication documented in the PACS or zVision Dashboard. Electronically Signed   By: Jeronimo Greaves M.D.   On: 10/02/2015 11:48    Scheduled Meds: . antiseptic oral rinse  7 mL Mouth Rinse BID  . aspirin  81 mg Oral QHS  . budesonide-formoterol  2 puff Inhalation BID  . clopidogrel  75 mg Oral Q breakfast  . dextrose      . escitalopram  20 mg Oral Daily  . insulin aspart  0-15 Units Subcutaneous 6 times per day  .  insulin glargine  11 Units Subcutaneous QHS  . levETIRAcetam  380 mg Oral Daily  . levETIRAcetam  750 mg Oral QHS  . levofloxacin (LEVAQUIN) IV  750 mg Intravenous Q24H  . levothyroxine  75 mcg Oral QAC breakfast  . pantoprazole  40 mg Oral Daily  . pravastatin  20 mg Oral QPM  . pregabalin  100 mg Oral QHS  . pregabalin  50 mg Oral Daily  . saccharomyces boulardii  250 mg Oral BID  . sodium chloride  3 mL Intravenous Q12H  . tiotropium  18 mcg Inhalation Daily  . vitamin B-12  1,000 mcg Oral Daily   Continuous Infusions: . sodium chloride 1,000 mL (10/02/15 0748)  Principal Problem:   Hyperglycemia due to type 1 diabetes mellitus (HCC) Active Problems:   Hypothyroidism   Essential hypertension   Type I diabetes mellitus with peripheral autonomic neuropathy (HCC)   Low back pain potentially associated with spinal stenosis   Anemia- chronic   B12 deficiency   COPD with exacerbation (HCC)   Narcotic dependency, continuous (HCC)   AKI (acute kidney injury) (HCC)   Elevated CEA   Elevated CA 19-9 level   CKD (chronic kidney disease), stage II   Heme positive stool    Time spent: 40 mins    [facility] MD [facility] Pager [phone]. If 7PM-7AM, please contact night-coverage at [email or web address], password [facility] 10/02/2015, 11:58 AM  LOS: 5 days

## 2015-10-02 NOTE — Progress Notes (Signed)
Dr. Janee Morn aware of MRI results.

## 2015-10-02 NOTE — Progress Notes (Cosign Needed)
Pt has CEA and CA 19-9 ordered at [place] [facility] for 3/17.  ROV with Dr Leone Payor of GI on 12/02/15.  Noted in discharge provider navigator portal.   Jennye Moccasin PA-C

## 2015-10-02 NOTE — Progress Notes (Signed)
Physical Therapy Treatment Patient Details Name: Benjamin Hodge MRN: [id number] DOB: 09-Nov-1940 Today's Date: 10/02/2015    History of Present Illness 75 y.o. male with PMH of insulin-dependent diabetes mellitus, chronic pain with narcotic dependency, CAD, COPD, and depression who presents to the ED when he was not able to control the sugar with insulin at home, found to have hyperglycemia and confusion. family reports patient is not able to care for self.    PT Comments    Progressing with mobility. Remains unsteady and continues to require assistance. Worked on challenging balance today (used 1 point of support-IV pole vs 2 points of support-RW). Pt reported increased back pain at end of session so encouraged pt to speak with RN to see if he was able to have something for pain. Continue to recommend ST rehab at SNF to maximize independence and safety with functional mobility.   Follow Up Recommendations  SNF     Equipment Recommendations  Rolling walker with 5" wheels    Recommendations for Other Services       Precautions / Restrictions Precautions Precautions: Fall Restrictions Weight Bearing Restrictions: No    Mobility  Bed Mobility Overal bed mobility: Needs Assistance Bed Mobility: Supine to Sit;Sit to Supine     Supine to sit: Modified independent (Device/Increase time) Sit to supine: Modified independent (Device/Increase time)      Transfers Overall transfer level: Needs assistance   Transfers: Sit to/from Stand Sit to Stand: Min guard         General transfer comment: close guard for safety.   Ambulation/Gait Ambulation/Gait assistance: Min assist Ambulation Distance (Feet): 250 Feet Assistive device:  (IV pole ) Gait Pattern/deviations: Step-through pattern;Decreased stride length     General Gait Details: Assist to stabilize throughout ambulation. Pt asked to try a walk without walker-used IV pole to allow for at least 1 point of UE support.  Unsteady. Noted some fatigued after ~100 feet.    Stairs            Wheelchair Mobility    Modified Rankin (Stroke Patients Only)       Balance           Standing balance support: Single extremity supported;During functional activity Standing balance-Leahy Scale: Poor                      Cognition Arousal/Alertness: Awake/alert Behavior During Therapy: WFL for tasks assessed/performed Overall Cognitive Status: Within Functional Limits for tasks assessed                      Exercises      General Comments        Pertinent Vitals/Pain Pain Assessment: No/denies pain    Home Living                      Prior Function            PT Goals (current goals can now be found in the care plan section) Progress towards PT goals: Progressing toward goals    Frequency  Min 3X/week    PT Plan Current plan remains appropriate    Co-evaluation             End of Session Equipment Utilized During Treatment: Gait belt Activity Tolerance: Patient limited by pain Patient left: in bed;with call bell/phone within reach;with bed alarm set     Time: 1419-1445 PT Time Calculation (min) (ACUTE ONLY): 26 min  Charges:  $Gait Training: 23-37 mins                    G Codes:      Rebeca Alert, MPT Pager: [phone]

## 2015-10-02 NOTE — Progress Notes (Signed)
Inpatient Diabetes Program Recommendations  AACE/ADA: New Consensus Statement on Inpatient Glycemic Control (2015)  Target Ranges:  Prepandial:   less than 140 mg/dL      Peak postprandial:   less than 180 mg/dL (1-2 hours)      Critically ill patients:  140 - 180 mg/dL   Review of Glycemic Control  Diabetes history: DM1 Outpatient Diabetes medications: Lantus 11 units QHS, Humalog s/s Current orders for Inpatient glycemic control: Novolog moderate Q4H, Lantus 11 units QHS.  Results for WAKE, ACRE (MRN [id number]) as of 10/02/2015 13:48  Ref. Range 10/01/2015 08:05 10/01/2015 12:04 10/01/2015 16:52 10/01/2015 20:18 10/01/2015 21:38 10/02/2015 00:20 10/02/2015 04:21 10/02/2015 07:43  Glucose-Capillary Latest Ref Range: 65-99 mg/dL 443 (H) 154 (H) 008 (H) 63 (L) 147 (H) 221 (H) 186 (H) 256 (H)  Results for MAICOL, BOBICK (MRN [id number]) as of 10/02/2015 13:48  Ref. Range 09/14/2015 00:32  Hemoglobin A1C Latest Ref Range: 4.8-5.6 % 12.4 (H)   HgbA1C likely not accurate with low H/H. Mild hypoglycemia on 1/24.  Inpatient Diabetes Program Recommendations:    Decrease Novolog to sensitive tidwc since pt is Type 1 and sensitive to insulin. Add Novolog 3 units tidwc for meal coverage insulin if pt eats > 50% meal  Will continue to follow. Thank you. Ailene Ards, RD, LDN, CDE Inpatient Diabetes Coordinator [phone]

## 2015-10-02 NOTE — Consult Note (Signed)
[facility] Consult: 12:13 PM 10/02/2015  LOS: 5 days    Referring Provider: Dr Janee Morn.   Primary Care Physician:  Kimber Relic, MD Primary Gastroenterologist:  Dr. Elnoria Howard in 2006. Dr Marina Goodell OV 01/2013.  Switched to [employer] 09/2013: last GI OV.  Endocrinologist: Dr. Talmage Coin   Reason for Consultation:  Weight loss, elevated CEA.     HPI: Benjamin Hodge is a 75 y.o. male.  Many medical issues.  IDDM, poor control. COPD, hypothyroid, htn, spinal stenosis with narcotic pain control.  NSTEMI 03/2014.  03/2014 Cath: 3 vessel CAD, normal LV fx, rec was medical mgt.  EF 55 to 60%, grade 1 diastolic dysfx as of 09/29/15 2D echo.  ASPVD with left LE ischemic ulcer; s/p left popliteal atherectomy/balloon angioplasty 02/2015.  Diffuse atherosclerosis of abdominal aorta and proximal iliacs per 03/2014 dopplers.  Non-occlusive (1 to 39%) bil carotid stenosis.  On chronic Plavix  Peripheral neuropathy.  Diabetic retinopathy, nephropathy.  ADHD. Chronic disease/B12 deficiency anemia, seen by Dr Bertis Ruddy 12/2013 who stopped po iron and added oral B12, felt testosterone he was taking would benefit anemia. Hypothyroidism.   06/2005 Colonoscopy for IDA, diarrhea.  Internal/external rrhoids, otherwise normal: rescreen 06/2015.  06/2005 gastric biopsy: chronic active gastritis with H pylori.  SB biopsy: no villous atrophy.  EGD op report not in Epic.  Pt does not recall if H Pylori was treated.  07/2008 Gastric emptying study normal. TTG, VIP, stool fat, urine 5-HIAA: normal.    Felt to have IBS-D as well as Diabetic gut neuropathy.   Frequent admissions in last few months.  Admitted last week with AMS/metabollic encephalopathy and nonketotic, hyperosmolar hyperglycemia with hyponatremia; AKI; COPD flare. Narcotics for pain control  contributing to AMS. Admission notes and family input relay poor, erratic po intake, pt endorses this and intermittent anorexia along with intermittent liquid/solid dysphagia.  Chronic loose stools every 2 to 3 days, intervening days has no stools. No bleeding PR.  No abdominal pain. C diff negative in 02/2015, 09/29/14.  FOBT + on 10/01/15.  Sugars difficult to manage at home due to anorexia, poor intake.  Since admission his appetite has greatly improved and eating 100% of meals.   CA 19-9 113, CEA 12.2.  LFTs normal.  Hgb 9 to 10, c/w 10 to 11 3 to 4 months back. MCV ok at 97.  Platelets, coags normal.  Iron studies, B12, Folate normal.  MR head with atrophy, small vessel dz.  No masses or stroke. MR/MRCP abdomen: liver, biliary tree normal. Periampulary diverticulum.  5 mm CBD.  No GB or CBD stones or sludge.  Right renal lesion suspicious for papillary type renal cell carcinoma vs angiomyolipoma.  AFL of esophagus suggest GERD/dysmotility  133# in 09/2013.  Currently 125 to 126#.   Admits to drinking 3 Margaritas (at home using a bottled mix) 3 days per week.  Used to drink similar quantities of Scotch but it got too expensive. No NSAIDs.   Takes Protonix 40 mg daily and oral B12 .     Past Medical History  Diagnosis Date  . CAD (coronary artery disease)     LAD 40-50% stenosis, first diagonal small 90% stenosis, circumflex 70% stenosis, OM 80% stenosis, right coronary artery 80-90% stenosis.  . Right upper lobe pneumonia 11/28/2008  . Anxiety 07/18/2008  . Irritable bowel syndrome 04/27/2008  . COPD (chronic obstructive pulmonary disease) (HCC) 02/27/2008  . B12 deficiency 02/16/2008    in 5/09: B12 262, MMA 1040  . Chronic diarrhea 01/01/2008    s/p EGD 10/06: H pylori + gastritis, duodenal biopsy normal (Dr. Elnoria Howard); s/p colonoscopy 10/06: hemorrhoids (Dr. Elnoria Howard); evaluated by Dr. Yancey Flemings  in 8/09: tissue transglutaminase AB negative, VIP normal, stool fat content normal, urine 5-HIAA  normal; normal GES 11/09 (done for "fluctuating sugars")  . 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 Orthostatic hypotension 02/09/2007  . Hypertension 02/09/2007  . Spinal stenosis in cervical region 05/03    MRI  . Erectile dysfunction 09/27/2006    s/p penile implant  . Anemia, iron deficiency 09/27/2006    neg colonoscopy 2006 - Dr. Elnoria Howard; ferritin 152; hgb  15.7  on 07/07  . Hypothyroidism 09/27/2006    TSH 2.639  07/07  . Hypersomnia 09/27/2006    evaluated by Dr. Jetty Duhamel (5/08) and Dr. Vickey Huger; PSG 8/09: chronic delayed sleep phase syndrome (patient sleeps during the day and is awake at night), nocturnal myoclonus (eval for RLS and IDA suggested). Pt advised to change sleeping behavior  . Diabetes mellitus type II 09/07/1984    poorly controlled, complicated by peripheral neuropathy, microalbuminuria, mild non proliferative retinopathy, s/p DKA 7/05, on insulin pump x 4/09, started a pump vacation on 11/19/2008  . Hyperlipidemia   . 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 Hemorrhoids   . Chronic kidney disease   . Unspecified hereditary and idiopathic peripheral neuropathy   . Other chronic pain   . Other malaise and fatigue   . Depression   . Hypertrophy of prostate without urinary obstruction and other lower urinary tract symptoms (LUTS)   . Lumbago   . Heart attack (HCC) 03/2014    mild    Past Surgical History  Procedure Laterality Date  . Penile prosthesis implant    . Tonsillectomy    . Colonoscopy  06/25/2005    Dr. Jeani Hawking  . Left heart catheterization with coronary angiogram [name] 03/13/2014    Procedure: LEFT HEART CATHETERIZATION WITH CORONARY ANGIOGRAM;  Surgeon: Peter M Swaziland, MD;  [facility];  Service: Cardiovascular;  Laterality: [name];  . Peripheral vascular catheterization [name] 02/19/2015    Procedure: Abdominal Aortogram;  Surgeon: Nada Libman, MD;  Location: [facility] INVASIVE CV LAB;  Service: Cardiovascular;  Laterality: [name];    . Peripheral vascular catheterization Left 02/19/2015    Procedure: Lower Extremity Angiography;  Surgeon: Nada Libman, MD;  Location: [facility] INVASIVE CV LAB;  Service: Cardiovascular;  Laterality: Left;    Prior to Admission medications   Medication Sig Start Date End Date Taking? Authorizing Provider  clopidogrel (PLAVIX) 75 MG tablet TAKE 1 TABLET BY MOUTH DAILY WITH BREAKFAST 06/11/15  Yes Kimber Relic, MD  escitalopram (LEXAPRO) 20 MG tablet TAKE ONE TABLET BY MOUTH ONE TIME DAILY 08/07/15  Yes Kimber Relic, MD  HYDROcodone-acetaminophen (NORCO/VICODIN) 5-325 MG tablet Take 1 tablet by mouth 5 (five) times daily. Patient taking differently: Take 1 tablet by mouth 4 (four) times daily as needed for severe pain.  09/23/15  Yes [name] L [name], DO  Insulin Glargine (TOUJEO SOLOSTAR) 300 UNIT/ML SOPN Inject  14 Units into the skin at bedtime. Patient taking differently: Inject 11 Units into the skin at bedtime.  09/08/15  Yes Catarina Hartshorn, MD  levETIRAcetam (KEPPRA) 750 MG tablet Take 1/2 tablet in morning. Take whole tablet at night. Patient taking differently: Take 375-750 mg by mouth 2 (two) times daily. Take 1/2 tablet in morning. Take whole tablet at night. 07/23/15  Yes Carmen Dohmeier, MD  levothyroxine (SYNTHROID, LEVOTHROID) 75 MCG tablet Take 1 tablet (75 mcg total) by mouth daily. 05/10/15  Yes Kimber Relic, MD  aspirin 81 MG tablet Take 81 mg by mouth at bedtime.     Historical Provider, MD  feeding supplement, GLUCERNA SHAKE, (GLUCERNA SHAKE) LIQD Take 237 mLs by mouth 3 (three) times daily between meals. Patient not taking: Reported on 09/27/2015 09/08/15   Catarina Hartshorn, MD  insulin lispro (HUMALOG) 100 UNIT/ML KiwkPen Inject into the skin as directed. Pt checks BC QID. Glucose Dosage Formula Per Physician Instructions. Current Blood Sugar-100/60 +  # of Carbs per meal/15=Amount of Insulin To Take. 03/16/13   Reather Littler, MD  metoprolol tartrate (LOPRESSOR) 25 MG tablet Take One tablet by mouth  twice daily to control BP 09/23/15   Kimber Relic, MD  Multiple Vitamin (MULTIVITAMIN WITH MINERALS) TABS tablet Take 1 tablet by mouth daily. Patient not taking: Reported on 09/27/2015 02/22/15   Osvaldo Shipper, MD  ONE TOUCH ULTRA TEST test strip TEST 8 TIMES PER DAY FOR 30 DAYS 04/26/14   Reather Littler, MD  pantoprazole (PROTONIX) 40 MG tablet TAKE ONE TABLET BY MOUTH ONE TIME DAILY 05/14/15   Kimber Relic, MD  pravastatin (PRAVACHOL) 20 MG tablet Take 1 tablet (20 mg total) by mouth every evening. NEEDS APPOINTMENT FOR FUTURE REFILLS OR 90-DAY SUPPLY 09/07/15   Rollene Rotunda, MD  pregabalin (LYRICA) 50 MG capsule Take one capsule by mouth in the morning and two capsules by mouth in the evening for pains. 08/14/15   Kimber Relic, MD  saccharomyces boulardii (FLORASTOR) 250 MG capsule Take 1 capsule (250 mg total) by mouth 2 (two) times daily. Patient taking differently: Take 250-500 mg by mouth every evening.  02/22/15   Osvaldo Shipper, MD  vitamin B-12 (CYANOCOBALAMIN) 1000 MCG tablet Take 1,000 mcg by mouth daily.    Historical Provider, MD    Scheduled Meds: . antiseptic oral rinse  7 mL Mouth Rinse BID  . aspirin  81 mg Oral QHS  . budesonide-formoterol  2 puff Inhalation BID  . clopidogrel  75 mg Oral Q breakfast  . dextrose      . escitalopram  20 mg Oral Daily  . insulin aspart  0-15 Units Subcutaneous 6 times per day  . insulin glargine  11 Units Subcutaneous QHS  . levETIRAcetam  380 mg Oral Daily  . levETIRAcetam  750 mg Oral QHS  . levofloxacin (LEVAQUIN) IV  750 mg Intravenous Q24H  . levothyroxine  75 mcg Oral QAC breakfast  . pantoprazole  40 mg Oral Daily  . pravastatin  20 mg Oral QPM  . pregabalin  100 mg Oral QHS  . pregabalin  50 mg Oral Daily  . saccharomyces boulardii  250 mg Oral BID  . sodium chloride  3 mL Intravenous Q12H  . tiotropium  18 mcg Inhalation Daily  . vitamin B-12  1,000 mcg Oral Daily   Infusions: . sodium chloride 1,000 mL (10/02/15 0748)    PRN Meds: acetaminophen **OR** acetaminophen, albuterol, alum & mag hydroxide-simeth, HYDROcodone-acetaminophen, morphine injection, ondansetron **OR**  ondansetron (ZOFRAN) IV, polyethylene glycol   Allergies as of 09/27/2015 - Review Complete 09/27/2015  Allergen Reaction Noted  . Ace inhibitors Other (See Comments) 01/23/2013  . Cymbalta [duloxetine hcl] Other (See Comments) 01/23/2013  . Lipitor [atorvastatin] Other (See Comments) 07/10/2014  . Bee venom Swelling 01/16/2013  . Glucerna [nutritional supplements] Diarrhea 09/13/2015  . Angiotensin receptor blockers Other (See Comments) 01/23/2013    Family History  Problem Relation Age of Onset  . Bone cancer Mother   . Breast cancer Mother   . Cancer Mother   . Lung cancer Father   . Cancer Father   . Breast cancer Sister   . Cancer Sister   . Lung cancer Brother   . Cancer Sister     type unknown    Social History   Social History  . Marital Status: Divorced    Spouse Name: [name]  . Number of Children: 1  . Years of Education: 15   Occupational History  . [employer]ATCH [employer]ER [employer]    Social History Main Topics  . Smoking status: Former Smoker    Types: Cigarettes    Quit date: 09/28/2004  . Smokeless tobacco: Never Used  . Alcohol Use: 0.0 oz/week    0 Standard drinks or equivalent per week     Comment: occ/social (moderate)  . Drug Use: No  . Sexual Activity: Not on file   Other Topics Concern  . Not on file   Social History Narrative   Patient is right handed, quit caffeinated beverages in 2005.   Works as a Museum/gallery conservator. One son. He is divorced.    REVIEW OF SYSTEMS: Constitutional:  Gets dizzy at times.  Not unsteady on feet.  ENT:  No nose bleeds Pulm:  + cough.  No dyspnea.  CV:  No palpitations, no LE edema.  GU:  No hematuria, no frequency GI:  Per HPI Heme:  No issues with excessive bleeding or bruising.    Transfusions:  none Neuro:  No headaches, no peripheral tingling or  numbness Derm:  No itching, no rash or sores.  Endocrine:  No sweats or chills.  No polyuria or dysuria Immunization:  Reviewed and up to date on flu, pneumovax.  Travel:  None beyond local counties in last few months.    PHYSICAL EXAM: Vital signs in last 24 hours: Filed Vitals:   10/01/15 2055 10/02/15 0520  BP: 151/75 157/64  Pulse: 67 71  Temp: 97.6 F (36.4 C) 97.4 F (36.3 C)  Resp: 16 16   Wt Readings from Last 3 Encounters:  10/02/15 57.1 kg (125 lb 14.1 oz)  09/06/15 57.3 kg (126 lb 5.2 oz)  07/16/15 57.607 kg (127 lb)   General: pleasant, chronically ill looking, comfortable Head:  No swelling or assymetry  Eyes:  No icterus or pallor Ears:  Not HOH  Nose:  No congestion or discharged.  Mouth:  No sores.  Mucosa pink and clear.   Neck:  No mass, TMG, JVD Lungs:  Clear bil.  No SOB.  Heart: RRR.  No MRG.  S1/S2 audible Abdomen:  Soft, NT, ND, no HSM.  Active BS.   Rectal: deferred   Musc/Skeltl: arthritis in fingers Extremities:  No CCE  Neurologic:  Oriented x 3.  No tremor or limb weakness.   Skin:  No rash, sores or telangectasia.  Tattoos:  None seen. Nodes:  No cervical adenopathy.    Psych:  Pleasant, cooperative. A bit anxious  Intake/Output from previous day: 01/24  0701 - Oct 23, 2022 0700 In: 1180 [P.O.:480; I.V.:700] Out: 2400 [Urine:2400] Intake/Output this shift: Total I/O In: -  Out: 650 [Urine:650]  LAB RESULTS:  Recent Labs  09/30/15 0420 10/01/15 0414 October 24, 2015 0405  WBC 5.3 5.1 5.6  HGB 9.3* 9.0* 9.4*  HCT 28.4* 27.2* 29.0*  PLT 183 164 193   BMET Lab Results  Component Value Date   NA 140 10-24-2015   NA 144 10/01/2015   NA 142 09/30/2015   K 4.3 2015/10/24   K 4.2 10/01/2015   K 4.3 09/30/2015   CL 107 2015/10/24   CL 110 10/01/2015   CL 108 09/30/2015   CO2 26 10-24-15   CO2 26 10/01/2015   CO2 24 09/30/2015   GLUCOSE 225* 10-24-2015   GLUCOSE 118* 10/01/2015   GLUCOSE 126* 09/30/2015   BUN 18 2015-10-24   BUN 17  10/01/2015   BUN 16 09/30/2015   CREATININE 0.94 10/24/15   CREATININE 1.11 10/01/2015   CREATININE 0.90 09/30/2015   CALCIUM 8.9 2015/10/24   CALCIUM 8.7* 10/01/2015   CALCIUM 8.5* 09/30/2015   LFT No results for input(s): PROT, ALBUMIN, AST, ALT, ALKPHOS, BILITOT, BILIDIR, IBILI in the last 72 hours. PT/INR Lab Results  Component Value Date   INR 1.01 09/28/2015   INR 0.91 03/12/2014   INR 0.89 08/06/2010   Hepatitis Panel No results for input(s): HEPBSAG, HCVAB, HEPAIGM, HEPBIGM in the last 72 hours. C-Diff No components found for: CDIFF Lipase  No results found for: LIPASE  Drugs of Abuse     Component Value Date/Time   LABOPIA NONE DETECTED 09/27/2015 2138   LABOPIA * 11/26/2008 0500    POSITIVE (NOTE) Result repeated and verified. Sent for confirmatory testing   COCAINSCRNUR NONE DETECTED 09/27/2015 2138   COCAINSCRNUR NEGATIVE 11/26/2008 0500   LABBENZ NONE DETECTED 09/27/2015 2138   LABBENZ NEGATIVE 11/26/2008 0500   AMPHETMU NONE DETECTED 09/27/2015 2138   AMPHETMU NEGATIVE 11/26/2008 0500   THCU NONE DETECTED 09/27/2015 2138   LABBARB NONE DETECTED 09/27/2015 2138     RADIOLOGY STUDIES: Mr 3d Recon At Scanner  24-Oct-2015  CLINICAL DATA:  Weight loss. Hypertension. Hyperlipidemia. CEA and CA 19 elevation. Chronic kidney disease. EXAM: MRI ABDOMEN WITHOUT AND WITH CONTRAST (INCLUDING MRCP) TECHNIQUE: Multiplanar multisequence MR imaging of the abdomen was performed both before and after the administration of intravenous contrast. Heavily T2-weighted images of the biliary and pancreatic ducts were obtained, and three-dimensional MRCP images were rendered by post processing. CONTRAST:  11mL MULTIHANCE GADOBENATE DIMEGLUMINE 529 MG/ML IV SOLN COMPARISON:  Aortic ultrasound of 03/11/2014.  No prior CT or MRI. FINDINGS: Mild motion degradation throughout. Lower chest: Small left pleural effusion. Fluid level in a distal esophagus, including on image 7/ series 3.  Hepatobiliary: Normal liver. Normal gallbladder. No intra or extrahepatic biliary duct dilatation. Normal common duct, 5 mm on image 44/series 4. Periampullary duodenal diverticulum. No evidence of obstructive mass or stone. Pancreas: Mild pancreatic atrophy, without mass or duct dilatation, given mild motion degradation. Spleen: Normal in size, without focal abnormality. Adrenals/Urinary Tract: Normal adrenal glands. Normal left kidney. Posterior interpolar right renal cyst of 11 mm. Anterior interpolar right renal lesion demonstrates T2 hypointensity on image 27/ series 3 and measures 1.7 x 1.6 cm on image 47 of series 65784. Precontrast hypointense relative to the remainder of the kidney, with mild post-contrast enhancement, including on image 51/1105. No hydronephrosis. Stomach/Bowel: Normal stomach and abdominal bowel loops. Vascular/Lymphatic: Patent right renal vein. Aortic and branch vessel atherosclerosis. No retroperitoneal  or retrocrural adenopathy. Other: No ascites. Musculoskeletal: No acute osseous abnormality. IMPRESSION: 1. Motion degraded exam. 2. No explanation for weight loss. Normal appearance of the liver and biliary tree. 3. 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 Lipid poor angiomyolipoma could look similar. 4. Trace left pleural fluid. 5. Esophageal air fluid level suggests dysmotility or gastroesophageal reflux. These results will be called to the ordering clinician or representative by the Radiologist Assistant, and communication documented in the PACS or zVision Dashboard. Electronically Signed   By: Jeronimo Greaves M.D.   On: 10/02/2015 11:48   Mr Abd W/wo Cm/mrcp  10/02/2015  CLINICAL DATA:  Weight loss. Hypertension. Hyperlipidemia. CEA and CA 19 elevation. Chronic kidney disease. EXAM: MRI ABDOMEN WITHOUT AND WITH CONTRAST (INCLUDING MRCP) TECHNIQUE: Multiplanar multisequence MR imaging of the abdomen was performed both before and  after the administration of intravenous contrast. Heavily T2-weighted images of the biliary and pancreatic ducts were obtained, and three-dimensional MRCP images were rendered by post processing. CONTRAST:  11mL MULTIHANCE GADOBENATE DIMEGLUMINE 529 MG/ML IV SOLN COMPARISON:  Aortic ultrasound of 03/11/2014.  No prior CT or MRI. FINDINGS: Mild motion degradation throughout. Lower chest: Small left pleural effusion. Fluid level in a distal esophagus, including on image 7/ series 3. Hepatobiliary: Normal liver. Normal gallbladder. No intra or extrahepatic biliary duct dilatation. Normal common duct, 5 mm on image 44/series 4. Periampullary duodenal diverticulum. No evidence of obstructive mass or stone. Pancreas: Mild pancreatic atrophy, without mass or duct dilatation, given mild motion degradation. Spleen: Normal in size, without focal abnormality. Adrenals/Urinary Tract: Normal adrenal glands. Normal left kidney. Posterior interpolar right renal cyst of 11 mm. Anterior interpolar right renal lesion demonstrates T2 hypointensity on image 27/ series 3 and measures 1.7 x 1.6 cm on image 47 of series 21308. Precontrast hypointense relative to the remainder of the kidney, with mild post-contrast enhancement, including on image 51/1105. No hydronephrosis. Stomach/Bowel: Normal stomach and abdominal bowel loops. Vascular/Lymphatic: Patent right renal vein. Aortic and branch vessel atherosclerosis. No retroperitoneal or retrocrural adenopathy. Other: No ascites. Musculoskeletal: No acute osseous abnormality. IMPRESSION: 1. Motion degraded exam. 2. No explanation for weight loss. Normal appearance of the liver and biliary tree. 3. 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 Lipid poor angiomyolipoma could look similar. 4. Trace left pleural fluid. 5. Esophageal air fluid level suggests dysmotility or gastroesophageal reflux. These results will be called to the ordering  clinician or representative by the Radiologist Assistant, and communication documented in the PACS or zVision Dashboard. Electronically Signed   By: Jeronimo Greaves M.D.   On: 10/02/2015 11:48    ENDOSCOPIC STUDIES: Per HPI  IMPRESSION:   *  Weight loss, failure to thrive: multifactorial.    *  Elevated CA 19-9, lesser elevation CEA.  No pancreatic, biliary, liver issues on MRCP. Right renal mass on MR.   *  Heme + stool   *  IBS-D.  *  GERD hx, on daily PPI.  H pylori per biopsy 2009, not clear it was ever treated. GERD confirmed on today's MRI.   Intermittent dysphagia.  *  Anemia of chronic disease.  Oral B12 at home.  Iron discontinued by Dr Bertis Ruddy in 2015.    *  Labile IDDM.   nonketotic, hyperosmolar hyperglycemia at admission, now with hypoglycemia.   *  AMS, encephalopathy, resolved.   *  CKD  *  Right renal mass, needs further investigation.  PLAN:     *  Per Dr Russella Dar.    Jennye Moccasin  10/02/2015, 12:13 PM Pager: [phone]     Attending physician's note   I have taken a history, examined the patient and reviewed the chart. I agree with the Advanced Practitioner's note, impression and recommendations. Mild weight loss, intermittently has poor appetite and good appetite, DM not well controlled and this is likely responsible for his appetite variations and mild weight loss. Elevated CEA and CA19-9 with an abd MRI/MRCP negative for GI disease however a right renal lesion was noted. No clear cause for elevated CEA and CA19-9. Heme positive stool noted without overt bleeding. Anemia of chronic disease.  Recommend optimizing DM control in hospital with very close outpatient follow up with Dr. Sharl Ma and Dr. Chilton Si and further evaluation of right renal lesion as first priorities. Further evaluation of heme positive stool can be further considered as an outpatient with Dr. Leone Payor. Consider repeating CEA and CA19-9 in several weeks. F/U with Dr. Leone Payor in a few weeks. GI  signing off.    Claudette Head, MD Clementeen Graham [phone] Mon-Fri 8a-5p [phone] after 5p, weekends, holidays

## 2015-10-02 NOTE — Progress Notes (Signed)
CRITICAL VALUE ALERT  Critical value received:  CBG 27  Date of notification:  10/02/15  Time of notification:  1143  Critical value read back:Yes.    Nurse who received alert:  Hulda Marin  MD notified (1st page):  Dr. Janee Morn  Time of first page:  1143  MD notified (2nd page):  Time of second page:  Responding MD:   Time MD responded:   Date of notification

## 2015-10-02 NOTE — Progress Notes (Signed)
CBG taken at 1702 was 423, Md notified earlier, received orders from Dr. Janee Morn, Will continue to monitor.

## 2015-10-02 NOTE — Consult Note (Signed)
Urology Consult   Physician requesting consult: Ramiro Harvest, M.D.  Reason for consult: Renal mass  History of Present Illness: Benjamin Hodge is a 75 y.o. male with a newly diagnosed 1.6 cm endophytic, enhancing RIGHT renal mass. This was discovered on MRCP obtained in the setting of elevated CEA/CA19-9 and constitutional symptoms of acute metabolic encephalopathy in the setting of non-ketotic hyperosmolar hyperglycemia with hyponatremia/AKI and narcotic use in addition to acute exacerbation of COPD. He had a CT head which was negative for brain lesions.He has had multiple admissions in the last 2 months and has had difficulty keeping up with his insulin regimen (history of IDDM). Negative UA without microscopic hematuria. CXR negative for infiltrate or suspicious masses. He lives at home but may require temporary rehab placement following discharge.  He has a history of COPD and CAD s/p angioplasty 02/19/2015 now managed on Plavix.  His urologic history is notable for hypogonadism, previously on injectable testosterone (testosterone cypionate 200 mg every 2 weeks) managed by Dr. Isabel Caprice. He was last seen in May 2015. He reports he stopped taking testosterone for unknown reasons 6 months ago and has been feeling increasingly fatigued with decreased libido. He has a history of IPP placement in the 90s with Dr. Patsi Sears which is no longer functional. He is not currently sexually active.  He had a contrasted chest CT in 2011 where the mass is subtle but visible in the upper pole of the RIGHT kidney and measures roughly 1.6 cm indicating that it has not grown significantly in the last 6 years.  He denies a history of voiding or storage urinary symptoms, hematuria, UTIs, STDs, urolithiasis, GU malignancy/trauma/surgery.  Past Medical History  Diagnosis Date  . CAD (coronary artery disease)     LAD 40-50% stenosis, first diagonal small 90% stenosis, circumflex 70% stenosis, OM 80% stenosis,  right coronary artery 80-90% stenosis.  . Right upper lobe pneumonia 11/28/2008  . Anxiety 07/18/2008  . Irritable bowel syndrome 04/27/2008  . COPD (chronic obstructive pulmonary disease) (HCC) 02/27/2008  . B12 deficiency 02/16/2008    in 5/09: B12 262, MMA 1040  . Chronic diarrhea 01/01/2008    s/p EGD 10/06: H pylori + gastritis, duodenal biopsy normal (Dr. Elnoria Howard); s/p colonoscopy 10/06: hemorrhoids (Dr. Elnoria Howard); evaluated by Dr. Yancey Flemings  in 8/09: tissue transglutaminase AB negative, VIP normal, stool fat content normal, urine 5-HIAA normal; normal GES 11/09 (done for "fluctuating sugars")  . 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 Orthostatic hypotension 02/09/2007  . Hypertension 02/09/2007  . Spinal stenosis in cervical region 05/03    MRI  . Erectile dysfunction 09/27/2006    s/p penile implant  . Anemia, iron deficiency 09/27/2006    neg colonoscopy 2006 - Dr. Elnoria Howard; ferritin 152; hgb  15.7  on 07/07  . Hypothyroidism 09/27/2006    TSH 2.639  07/07  . Hypersomnia 09/27/2006    evaluated by Dr. Jetty Duhamel (5/08) and Dr. Vickey Huger; PSG 8/09: chronic delayed sleep phase syndrome (patient sleeps during the day and is awake at night), nocturnal myoclonus (eval for RLS and IDA suggested). Pt advised to change sleeping behavior  . Diabetes mellitus type II 09/07/1984    poorly controlled, complicated by peripheral neuropathy, microalbuminuria, mild non proliferative retinopathy, s/p DKA 7/05, on insulin pump x 4/09, started a pump vacation on 11/19/2008  . Hyperlipidemia   . 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 Hemorrhoids   . Chronic kidney disease   . Unspecified hereditary and idiopathic peripheral neuropathy   .  Other chronic pain   . Other malaise and fatigue   . Depression   . Hypertrophy of prostate without urinary obstruction and other lower urinary tract symptoms (LUTS)   . Lumbago   . Heart attack (HCC) 03/2014    mild    Past Surgical History   Procedure Laterality Date  . Penile prosthesis implant    . Tonsillectomy    . Colonoscopy  06/25/2005    Dr. Jeani Hawking  . Left heart catheterization with coronary angiogram [name] 03/13/2014    Procedure: LEFT HEART CATHETERIZATION WITH CORONARY ANGIOGRAM;  Surgeon: Peter M Swaziland, MD;  Location: [facility] CATH LAB;  Service: Cardiovascular;  Laterality: [name];  . Peripheral vascular catheterization [name] 02/19/2015    Procedure: Abdominal Aortogram;  Surgeon: Nada Libman, MD;  Location: [facility] INVASIVE CV LAB;  Service: Cardiovascular;  Laterality: [name];  . Peripheral vascular catheterization Left 02/19/2015    Procedure: Lower Extremity Angiography;  Surgeon: Nada Libman, MD;  Location: [facility] INVASIVE CV LAB;  Service: Cardiovascular;  Laterality: Left;    Current Hospital Medications:  Home Meds:    Medication List    ASK your doctor about these medications        aspirin 81 MG tablet  Take 81 mg by mouth at bedtime.     clopidogrel 75 MG tablet  Commonly known as:  PLAVIX  TAKE 1 TABLET BY MOUTH DAILY WITH BREAKFAST     escitalopram 20 MG tablet  Commonly known as:  LEXAPRO  TAKE ONE TABLET BY MOUTH ONE TIME DAILY     feeding supplement (GLUCERNA SHAKE) Liqd  Take 237 mLs by mouth 3 (three) times daily between meals.     HYDROcodone-acetaminophen 5-325 MG tablet  Commonly known as:  NORCO/VICODIN  Take 1 tablet by mouth 5 (five) times daily.     Insulin Glargine 300 UNIT/ML Sopn  Commonly known as:  TOUJEO SOLOSTAR  Inject 14 Units into the skin at bedtime.     insulin lispro 100 UNIT/ML KiwkPen  Commonly known as:  HUMALOG  Inject into the skin as directed. Pt checks BC QID. Glucose Dosage Formula Per Physician Instructions. Current Blood Sugar-100/60 +  # of Carbs per meal/15=Amount of Insulin To Take.     levETIRAcetam 750 MG tablet  Commonly known as:  KEPPRA  Take 1/2 tablet in morning. Take whole tablet at night.     levothyroxine 75 MCG tablet  Commonly known as:   SYNTHROID, LEVOTHROID  Take 1 tablet (75 mcg total) by mouth daily.     metoprolol tartrate 25 MG tablet  Commonly known as:  LOPRESSOR  Take One tablet by mouth twice daily to control BP     multivitamin with minerals Tabs tablet  Take 1 tablet by mouth daily.     ONE TOUCH ULTRA TEST test strip  Generic drug:  glucose blood  TEST 8 TIMES PER DAY FOR 30 DAYS     pantoprazole 40 MG tablet  Commonly known as:  PROTONIX  TAKE ONE TABLET BY MOUTH ONE TIME DAILY     pravastatin 20 MG tablet  Commonly known as:  PRAVACHOL  Take 1 tablet (20 mg total) by mouth every evening. NEEDS APPOINTMENT FOR FUTURE REFILLS OR 90-DAY SUPPLY     pregabalin 50 MG capsule  Commonly known as:  LYRICA  Take one capsule by mouth in the morning and two capsules by mouth in the evening for pains.     saccharomyces boulardii 250 MG capsule  Commonly known as:  FLORASTOR  Take 1 capsule (250 mg total) by mouth 2 (two) times daily.     vitamin B-12 1000 MCG tablet  Commonly known as:  CYANOCOBALAMIN  Take 1,000 mcg by mouth daily.        Scheduled Meds: . antiseptic oral rinse  7 mL Mouth Rinse BID  . aspirin  81 mg Oral QHS  . budesonide-formoterol  2 puff Inhalation BID  . clopidogrel  75 mg Oral Q breakfast  . dextrose      . escitalopram  20 mg Oral Daily  . insulin aspart  0-15 Units Subcutaneous 6 times per day  . insulin glargine  11 Units Subcutaneous QHS  . levETIRAcetam  380 mg Oral Daily  . levETIRAcetam  750 mg Oral QHS  . levofloxacin  750 mg Oral Daily  . levothyroxine  75 mcg Oral QAC breakfast  . pantoprazole  40 mg Oral Daily  . pravastatin  20 mg Oral QPM  . pregabalin  100 mg Oral QHS  . pregabalin  50 mg Oral Daily  . saccharomyces boulardii  250 mg Oral BID  . sodium chloride  3 mL Intravenous Q12H  . tiotropium  18 mcg Inhalation Daily  . vitamin B-12  1,000 mcg Oral Daily   Continuous Infusions: . sodium chloride 1,000 mL (10/02/15 0748)   PRN  Meds:.acetaminophen **OR** acetaminophen, albuterol, alum & mag hydroxide-simeth, HYDROcodone-acetaminophen, morphine injection, ondansetron **OR** ondansetron (ZOFRAN) IV, polyethylene glycol  Allergies:  Allergies  Allergen Reactions  . Ace Inhibitors Other (See Comments)    dizzy  . Cymbalta [Duloxetine Hcl] Other (See Comments)    dizzy  . Lipitor [Atorvastatin] Other (See Comments)    Caused pain all over body.  . Bee Venom Swelling  . Glucerna [Nutritional Supplements] Diarrhea  . Angiotensin Receptor Blockers Other (See Comments)    unknown    Family History  Problem Relation Age of Onset  . Bone cancer Mother   . Breast cancer Mother   . Cancer Mother   . Lung cancer Father   . Cancer Father   . Breast cancer Sister   . Cancer Sister   . Lung cancer Brother   . Cancer Sister     type unknown    Social History:  reports that he quit smoking about 11 years ago. His smoking use included Cigarettes. He has never used smokeless tobacco. He reports that he drinks alcohol. He reports that he does not use illicit drugs.  ROS: A complete review of systems was performed.  All systems are negative except for pertinent findings as noted.  Physical Exam:  Vital signs in last 24 hours: Temp:  [97.4 F (36.3 C)-98 F (36.7 C)] 98 F (36.7 C) (01/25 1450) Pulse Rate:  [67-75] 75 (01/25 1450) Resp:  [16-18] 18 (01/25 1450) BP: (128-157)/(53-75) 128/53 mmHg (01/25 1450) SpO2:  [99 %-100 %] 99 % (01/25 1450) Weight:  [57.1 kg (125 lb 14.1 oz)] 57.1 kg (125 lb 14.1 oz) (01/25 0520) Constitutional:  Alert and oriented, No acute distress Cardiovascular: Regular rate and rhythm, No JVD Respiratory: Normal respiratory effort, Lungs clear bilaterally GI: Abdomen is soft, nontender, nondistended, no abdominal masses GU: No CVA tenderness, normal testes bilaterally, 3-piece IPP pump palpable in the RIGHT hemiscrotum, non-functional, no erosion, circumcised, normal phallus Lymphatic:  No lymphadenopathy Neurologic: Grossly intact, no focal deficits Psychiatric: Normal mood and affect  Laboratory Data:   Recent Labs  09/30/15 0420 10/01/15 0414 10/02/15 0405  WBC  5.3 5.1 5.6  HGB 9.3* 9.0* 9.4*  HCT 28.4* 27.2* 29.0*  PLT 183 164 193     Recent Labs  09/30/15 0420 10/01/15 0414 10/02/15 0405  NA 142 144 140  K 4.3 4.2 4.3  CL 108 110 107  GLUCOSE 126* 118* 225*  BUN 16 17 18   CALCIUM 8.5* 8.7* 8.9  CREATININE 0.90 1.11 0.94     Results for orders placed or performed during the hospital encounter of 09/27/15 (from the past 24 hour(s))  Glucose, capillary     Status: Abnormal   Collection Time: 10/01/15  4:52 PM  Result Value Ref Range   Glucose-Capillary 131 (H) 65 - 99 mg/dL   Comment 1 Notify RN    Comment 2 Document in Chart   Glucose, capillary     Status: Abnormal   Collection Time: 10/01/15  8:18 PM  Result Value Ref Range   Glucose-Capillary 63 (L) 65 - 99 mg/dL   Comment 1 Notify RN   Glucose, capillary     Status: Abnormal   Collection Time: 10/01/15  9:38 PM  Result Value Ref Range   Glucose-Capillary 147 (H) 65 - 99 mg/dL   Comment 1 Notify RN   Glucose, capillary     Status: Abnormal   Collection Time: 10/02/15 12:20 AM  Result Value Ref Range   Glucose-Capillary 221 (H) 65 - 99 mg/dL   Comment 1 Notify RN   CBC     Status: Abnormal   Collection Time: 10/02/15  4:05 AM  Result Value Ref Range   WBC 5.6 4.0 - 10.5 K/uL   RBC 2.98 (L) 4.22 - 5.81 MIL/uL   Hemoglobin 9.4 (L) 13.0 - 17.0 g/dL   HCT 16.1 (L) 09.6 - 04.5 %   MCV 97.3 78.0 - 100.0 fL   MCH 31.5 26.0 - 34.0 pg   MCHC 32.4 30.0 - 36.0 g/dL   RDW 40.9 81.1 - 91.4 %   Platelets 193 150 - 400 K/uL  Basic metabolic panel     Status: Abnormal   Collection Time: 10/02/15  4:05 AM  Result Value Ref Range   Sodium 140 135 - 145 mmol/L   Potassium 4.3 3.5 - 5.1 mmol/L   Chloride 107 101 - 111 mmol/L   CO2 26 22 - 32 mmol/L   Glucose, Bld 225 (H) 65 - 99 mg/dL    BUN 18 6 - 20 mg/dL   Creatinine, Ser 7.82 0.61 - 1.24 mg/dL   Calcium 8.9 8.9 - 95.6 mg/dL   GFR calc non Af Amer >60 >60 mL/min   GFR calc Af Amer >60 >60 mL/min   Anion gap 7 5 - 15  Glucose, capillary     Status: Abnormal   Collection Time: 10/02/15  4:21 AM  Result Value Ref Range   Glucose-Capillary 186 (H) 65 - 99 mg/dL   Comment 1 Notify RN   Glucose, capillary     Status: Abnormal   Collection Time: 10/02/15  7:43 AM  Result Value Ref Range   Glucose-Capillary 256 (H) 65 - 99 mg/dL   Comment 1 Notify RN    Comment 2 Document in Chart   Glucose, capillary     Status: Abnormal   Collection Time: 10/02/15 11:40 AM  Result Value Ref Range   Glucose-Capillary 27 (LL) 65 - 99 mg/dL  Glucose, capillary     Status: Abnormal   Collection Time: 10/02/15 12:00 PM  Result Value Ref Range   Glucose-Capillary 112 (H)  65 - 99 mg/dL   Recent Results (from the past 240 hour(s))  Urine culture     Status: None   Collection Time: 09/27/15  4:57 PM  Result Value Ref Range Status   Specimen Description URINE, CLEAN CATCH  Final   Special Requests NONE  Final   Culture   Final    10,000 COLONIES/mL STAPHYLOCOCCUS SPECIES (COAGULASE NEGATIVE) Performed at [facility]    Report Status 09/30/2015 FINAL  Final   Organism ID, Bacteria STAPHYLOCOCCUS SPECIES (COAGULASE NEGATIVE)  Final      Susceptibility   Staphylococcus species (coagulase negative) - MIC*    CIPROFLOXACIN >=8 RESISTANT Resistant     GENTAMICIN <=0.5 SENSITIVE Sensitive     NITROFURANTOIN <=16 SENSITIVE Sensitive     OXACILLIN >=4 RESISTANT Resistant     TETRACYCLINE >=16 RESISTANT Resistant     VANCOMYCIN 2 SENSITIVE Sensitive     TRIMETH/SULFA <=10 SENSITIVE Sensitive     CLINDAMYCIN >=8 RESISTANT Resistant     RIFAMPIN <=0.5 SENSITIVE Sensitive     Inducible Clindamycin NEGATIVE Sensitive     * 10,000 COLONIES/mL STAPHYLOCOCCUS SPECIES (COAGULASE NEGATIVE)  MRSA PCR Screening     Status: Abnormal    Collection Time: 09/27/15 10:02 PM  Result Value Ref Range Status   MRSA by PCR INVALID RESULTS, SPECIMEN SENT FOR CULTURE (A) NEGATIVE Final    Comment:        The GeneXpert MRSA Assay (FDA approved for NASAL specimens only), is one component of a comprehensive MRSA colonization surveillance program. It is not intended to diagnose MRSA infection nor to guide or monitor treatment for MRSA infections. Brayton Layman RN 1610 09/28/15 A NAVARRO   MRSA culture     Status: None   Collection Time: 09/27/15 10:02 PM  Result Value Ref Range Status   Specimen Description NOSE  Final   Special Requests NONE  Final   Culture NOMRSA Performed at [facility]   Final   Report Status 09/29/2015 FINAL  Final  C difficile quick scan w PCR reflex     Status: None   Collection Time: 09/30/15  8:38 AM  Result Value Ref Range Status   C Diff antigen NEGATIVE NEGATIVE Final   C Diff toxin NEGATIVE NEGATIVE Final   C Diff interpretation Negative for toxigenic C. difficile  Final    Renal Function:  Recent Labs  09/27/15 2110 09/28/15 0110 09/28/15 0511 09/29/15 0450 09/30/15 0420 10/01/15 0414 10/02/15 0405  CREATININE 1.22 1.19 1.24 1.21 0.90 1.11 0.94   Estimated Creatinine Clearance: 57.4 mL/min (by C-G formula based on Cr of 0.94).  Radiologic Imaging: Mr 3d Recon At Scanner  10/02/2015  CLINICAL DATA:  Weight loss. Hypertension. Hyperlipidemia. CEA and CA 19 elevation. Chronic kidney disease. EXAM: MRI ABDOMEN WITHOUT AND WITH CONTRAST (INCLUDING MRCP) TECHNIQUE: Multiplanar multisequence MR imaging of the abdomen was performed both before and after the administration of intravenous contrast. Heavily T2-weighted images of the biliary and pancreatic ducts were obtained, and three-dimensional MRCP images were rendered by post processing. CONTRAST:  11mL MULTIHANCE GADOBENATE DIMEGLUMINE 529 MG/ML IV SOLN COMPARISON:  Aortic ultrasound of 03/11/2014.  No prior CT or MRI.  FINDINGS: Mild motion degradation throughout. Lower chest: Small left pleural effusion. Fluid level in a distal esophagus, including on image 7/ series 3. Hepatobiliary: Normal liver. Normal gallbladder. No intra or extrahepatic biliary duct dilatation. Normal common duct, 5 mm on image 44/series 4. Periampullary duodenal diverticulum. No evidence of obstructive mass or  stone. Pancreas: Mild pancreatic atrophy, without mass or duct dilatation, given mild motion degradation. Spleen: Normal in size, without focal abnormality. Adrenals/Urinary Tract: Normal adrenal glands. Normal left kidney. Posterior interpolar right renal cyst of 11 mm. Anterior interpolar right renal lesion demonstrates T2 hypointensity on image 27/ series 3 and measures 1.7 x 1.6 cm on image 47 of series 29021. Precontrast hypointense relative to the remainder of the kidney, with mild post-contrast enhancement, including on image 51/1105. No hydronephrosis. Stomach/Bowel: Normal stomach and abdominal bowel loops. Vascular/Lymphatic: Patent right renal vein. Aortic and branch vessel atherosclerosis. No retroperitoneal or retrocrural adenopathy. Other: No ascites. Musculoskeletal: No acute osseous abnormality. IMPRESSION: 1. Motion degraded exam. 2. No explanation for weight loss. Normal appearance of the liver and biliary tree. 3. 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 Lipid poor angiomyolipoma could look similar. 4. Trace left pleural fluid. 5. Esophageal air fluid level suggests dysmotility or gastroesophageal reflux. These results will be called to the ordering clinician or representative by the Radiologist Assistant, and communication documented in the PACS or zVision Dashboard. Electronically Signed   By: Jeronimo Greaves M.D.   On: 10/02/2015 11:48   Mr Abd W/wo Cm/mrcp  10/02/2015  CLINICAL DATA:  Weight loss. Hypertension. Hyperlipidemia. CEA and CA 19 elevation. Chronic kidney disease.  EXAM: MRI ABDOMEN WITHOUT AND WITH CONTRAST (INCLUDING MRCP) TECHNIQUE: Multiplanar multisequence MR imaging of the abdomen was performed both before and after the administration of intravenous contrast. Heavily T2-weighted images of the biliary and pancreatic ducts were obtained, and three-dimensional MRCP images were rendered by post processing. CONTRAST:  1mL MULTIHANCE GADOBENATE DIMEGLUMINE 529 MG/ML IV SOLN COMPARISON:  Aortic ultrasound of 03/11/2014.  No prior CT or MRI. FINDINGS: Mild motion degradation throughout. Lower chest: Small left pleural effusion. Fluid level in a distal esophagus, including on image 7/ series 3. Hepatobiliary: Normal liver. Normal gallbladder. No intra or extrahepatic biliary duct dilatation. Normal common duct, 5 mm on image 44/series 4. Periampullary duodenal diverticulum. No evidence of obstructive mass or stone. Pancreas: Mild pancreatic atrophy, without mass or duct dilatation, given mild motion degradation. Spleen: Normal in size, without focal abnormality. Adrenals/Urinary Tract: Normal adrenal glands. Normal left kidney. Posterior interpolar right renal cyst of 11 mm. Anterior interpolar right renal lesion demonstrates T2 hypointensity on image 27/ series 3 and measures 1.7 x 1.6 cm on image 47 of series 11552. Precontrast hypointense relative to the remainder of the kidney, with mild post-contrast enhancement, including on image 51/1105. No hydronephrosis. Stomach/Bowel: Normal stomach and abdominal bowel loops. Vascular/Lymphatic: Patent right renal vein. Aortic and branch vessel atherosclerosis. No retroperitoneal or retrocrural adenopathy. Other: No ascites. Musculoskeletal: No acute osseous abnormality. IMPRESSION: 1. Motion degraded exam. 2. No explanation for weight loss. Normal appearance of the liver and biliary tree. 3. 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 Lipid poor angiomyolipoma could look similar. 4.  Trace left pleural fluid. 5. Esophageal air fluid level suggests dysmotility or gastroesophageal reflux. These results will be called to the ordering clinician or representative by the Radiologist Assistant, and communication documented in the PACS or zVision Dashboard. Electronically Signed   By: Jeronimo Greaves M.D.   On: 10/02/2015 11:48    I independently reviewed the above imaging studies.  Impression/Recommendation 74yM with history of hypogonadism as well as a newly diagnosed 1.6 cm endophytic RIGHT renal mass. Found incidentally during MRCP for work-up of elevated CEA and CA 19-9.  I had a  long discussion with the patient concerning his renal mass as well as his hypogonadism  1-RIGHT Renal Mass  I discussed the natural history of these small masses and the risk of malignancy. I also discussed the role of percutaneous biopsy as well as treatment options including active surveillance, robotic or open partial nephrectomy, robotic, open or laparoscopic radical nephrectomy, and percutaneous cryoablation.    We discussed that the location of the mass makes percutaneous biopsy and cryoablation/RFA poor treatment options. Thus, we discussed that nephron sparing surgical resection or surveillance would be appropriate management strategies.  We discussed that it was our recommendation that we plan on surveillance with repeat imaging in 3 month to characterize growth of the mass and to allow him to be stabilized medically and to sort out the reason for CEA/Ca 19-9 elevation.   This renal mass is almost certainly not contributing to his recent decline and current symptoms and represents and incidental finding.  We will plan to see him back in the outpatient setting with repeat imaging in 3 months as part of a surveillance protocol.  2-Hypogonadism  With respect to his hypogonadism, he has certainly been feeling more fatigued with notable decrease in libido since discontinuing therapy 6 months ago.  He was previously on testosterone cypionate 200 mg injections once every 2 weeks. His last testosterone in the office in May 2015 was 178. He has never had issues with elevated PSA.  We would be happy to re-visit restarting testosterone replacement therapy should his current health situation permit it. He will contact our office an arrange a follow-up visit with Dr. Isabel Caprice at his convenience. We discussed that we could also address his malfunctioned IPP device at that visit as well.  Patient seen and discussed with Dr. Laverle Patter.  Loura Pardon 10/02/2015, 4:12 PM    I have seen and examined the patient and agree with the above assessment and plan.  Patient with incidental small right renal mass that appears stable in retrospect dating back to 2011 indicating a potentially indolent lesion. This lesion is unrelated to his current medical issues that have required hospitalization.  Especially considering other current issues, he should follow up in 3 months with Dr. Isabel Caprice to undergo repeat imaging of the kidneys for further definitive renal imaging and evaluation.  This will also allow him to discuss the potential risks/benefits of resuming testosterone replacement therapy.

## 2015-10-03 ENCOUNTER — Encounter (HOSPITAL_COMMUNITY): Payer: Self-pay | Admitting: Internal Medicine

## 2015-10-03 ENCOUNTER — Encounter: Payer: Self-pay | Admitting: *Deleted

## 2015-10-03 DIAGNOSIS — R1314 Dysphagia, pharyngoesophageal phase: Secondary | ICD-10-CM | POA: Diagnosis not present

## 2015-10-03 DIAGNOSIS — I10 Essential (primary) hypertension: Secondary | ICD-10-CM | POA: Diagnosis not present

## 2015-10-03 DIAGNOSIS — E039 Hypothyroidism, unspecified: Secondary | ICD-10-CM | POA: Diagnosis not present

## 2015-10-03 DIAGNOSIS — R739 Hyperglycemia, unspecified: Secondary | ICD-10-CM | POA: Diagnosis not present

## 2015-10-03 DIAGNOSIS — R278 Other lack of coordination: Secondary | ICD-10-CM | POA: Diagnosis not present

## 2015-10-03 DIAGNOSIS — M549 Dorsalgia, unspecified: Secondary | ICD-10-CM | POA: Diagnosis not present

## 2015-10-03 DIAGNOSIS — E860 Dehydration: Secondary | ICD-10-CM | POA: Diagnosis not present

## 2015-10-03 DIAGNOSIS — R2681 Unsteadiness on feet: Secondary | ICD-10-CM | POA: Diagnosis not present

## 2015-10-03 DIAGNOSIS — R569 Unspecified convulsions: Secondary | ICD-10-CM | POA: Diagnosis not present

## 2015-10-03 DIAGNOSIS — E291 Testicular hypofunction: Secondary | ICD-10-CM

## 2015-10-03 DIAGNOSIS — J449 Chronic obstructive pulmonary disease, unspecified: Secondary | ICD-10-CM | POA: Diagnosis not present

## 2015-10-03 DIAGNOSIS — M4806 Spinal stenosis, lumbar region: Secondary | ICD-10-CM | POA: Diagnosis not present

## 2015-10-03 DIAGNOSIS — Z794 Long term (current) use of insulin: Secondary | ICD-10-CM | POA: Diagnosis not present

## 2015-10-03 DIAGNOSIS — N182 Chronic kidney disease, stage 2 (mild): Secondary | ICD-10-CM | POA: Diagnosis not present

## 2015-10-03 DIAGNOSIS — E1065 Type 1 diabetes mellitus with hyperglycemia: Secondary | ICD-10-CM | POA: Diagnosis not present

## 2015-10-03 DIAGNOSIS — N179 Acute kidney failure, unspecified: Secondary | ICD-10-CM | POA: Diagnosis not present

## 2015-10-03 DIAGNOSIS — D510 Vitamin B12 deficiency anemia due to intrinsic factor deficiency: Secondary | ICD-10-CM | POA: Diagnosis not present

## 2015-10-03 DIAGNOSIS — N2889 Other specified disorders of kidney and ureter: Secondary | ICD-10-CM

## 2015-10-03 DIAGNOSIS — E109 Type 1 diabetes mellitus without complications: Secondary | ICD-10-CM | POA: Diagnosis not present

## 2015-10-03 DIAGNOSIS — M6281 Muscle weakness (generalized): Secondary | ICD-10-CM | POA: Diagnosis not present

## 2015-10-03 DIAGNOSIS — E1140 Type 2 diabetes mellitus with diabetic neuropathy, unspecified: Secondary | ICD-10-CM | POA: Diagnosis not present

## 2015-10-03 DIAGNOSIS — J441 Chronic obstructive pulmonary disease with (acute) exacerbation: Secondary | ICD-10-CM | POA: Diagnosis not present

## 2015-10-03 DIAGNOSIS — D631 Anemia in chronic kidney disease: Secondary | ICD-10-CM | POA: Diagnosis not present

## 2015-10-03 HISTORY — DX: Testicular hypofunction: E29.1

## 2015-10-03 HISTORY — DX: Other specified disorders of kidney and ureter: N28.89

## 2015-10-03 LAB — CBC
HCT: 26.8 % — ABNORMAL LOW (ref 39.0–52.0)
Hemoglobin: 8.6 g/dL — ABNORMAL LOW (ref 13.0–17.0)
MCH: 31.5 pg (ref 26.0–34.0)
MCHC: 32.1 g/dL (ref 30.0–36.0)
MCV: 98.2 fL (ref 78.0–100.0)
PLATELETS: 192 10*3/uL (ref 150–400)
RBC: 2.73 MIL/uL — ABNORMAL LOW (ref 4.22–5.81)
RDW: 13.4 % (ref 11.5–15.5)
WBC: 5.6 10*3/uL (ref 4.0–10.5)

## 2015-10-03 LAB — BASIC METABOLIC PANEL
Anion gap: 8 (ref 5–15)
BUN: 19 mg/dL (ref 6–20)
CO2: 26 mmol/L (ref 22–32)
CREATININE: 1.16 mg/dL (ref 0.61–1.24)
Calcium: 9 mg/dL (ref 8.9–10.3)
Chloride: 109 mmol/L (ref 101–111)
GFR calc Af Amer: >60 mL/min (ref >60)
GLUCOSE: 87 mg/dL (ref 65–99)
Potassium: 4 mmol/L (ref 3.5–5.1)
Sodium: 143 mmol/L (ref 135–145)

## 2015-10-03 LAB — GLUCOSE, CAPILLARY
GLUCOSE-CAPILLARY: 74 mg/dL (ref 65–99)
GLUCOSE-CAPILLARY: 140 mg/dL — AB (ref 65–99)
GLUCOSE-CAPILLARY: 193 mg/dL — AB (ref 65–99)
Glucose-Capillary: 166 mg/dL — ABNORMAL HIGH (ref 65–99)
Glucose-Capillary: 266 mg/dL — ABNORMAL HIGH (ref 65–99)

## 2015-10-03 MED ORDER — HYDROCODONE-ACETAMINOPHEN 5-325 MG PO TABS: 1.0000 | ORAL_TABLET | Freq: Four times a day (QID) | ORAL | Status: DC | PRN

## 2015-10-03 MED ORDER — INSULIN ASPART 100 UNIT/ML ~~LOC~~ SOLN: 3.0000 [IU] | Freq: Three times a day (TID) | SUBCUTANEOUS | Status: DC

## 2015-10-03 MED ORDER — BUDESONIDE-FORMOTEROL FUMARATE 160-4.5 MCG/ACT IN AERO: 2.0000 | INHALATION_SPRAY | Freq: Two times a day (BID) | RESPIRATORY_TRACT | Status: DC

## 2015-10-03 MED ORDER — TIOTROPIUM BROMIDE MONOHYDRATE 18 MCG IN CAPS: 18.0000 ug | ORAL_CAPSULE | Freq: Every day | RESPIRATORY_TRACT | Status: DC

## 2015-10-03 MED ORDER — INSULIN GLARGINE 300 UNIT/ML ~~LOC~~ SOPN: 11.0000 [IU] | PEN_INJECTOR | Freq: Every day | SUBCUTANEOUS | Status: DC

## 2015-10-03 NOTE — Discharge Summary (Signed)
Physician Discharge Summary  Benjamin Hodge ZOX:[id number] DOB: 1941-01-08 DOA: 09/27/2015  PCP: Kimber Relic, MD  Admit date: 09/27/2015 Discharge date: 10/03/2015  Time spent: 65 minutes  Recommendations for Outpatient Follow-up:  1. Follow-up with Dr. Leone Payor of gastroenterology as scheduled. 2. Follow-up with Dr. Isabel Caprice, Urology in 3 months for follow-up on repeat imaging of the kidneys for definitive renal imaging and evaluation of right renal mass seen during this hospitalization as well as to discuss resumption of patient's testosterone replacement therapy. 3. Follow-up with Dr. Sharl Ma, endocrinology in 2-3 weeks. 4. Follow-up with M.D. at skilled nursing facility.   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  Filed Weights   10/01/15 0500 10/02/15 0520 10/03/15 0429  Weight: 58.6 kg (129 lb 3 oz) 57.1 kg (125 lb 14.1 oz) 57.017 kg (125 lb 11.2 oz)    History of present illness:  Benjamin Hodge is a pleasant 75 year male with multiple medical problems, including Insulin dependent Diabetes Mellitus, poorly controlled HbA1C 12.4, COPD, Essential Htn, Hypothyroidism who came in with reduced level of consciousness associated with high sugars, and he is found to have Acute Metabolic encephalopathy in  setting of nonketotic hyperosmolar hyperglycemia with hyponatremia/acute kidney injury and narcotics. He also had acute exacerbation of COPD. Unfortunately, his history was rather limited as he remained disoriented and there is no family member at bedside. Patient has had multiple admissions in the last month or two and it is apparent that he is failing to cope at home. On day of admission, he was not sure if he forgot to take his insulin but he admitted to increasing cough. Labs were significant for wbc 7400, bun/cr 43/1.43, sodium 126 glucose 673 mg/dl. UA does not suggest UTI. CXR did not show acute infiltrate. He was admitted to the Step Down Unit for IVF/glucose stabilizer/empiric antibiotics, and bronchodilators. He will probably require short term rehab placement once clinically stable.   Hospital Course:  #1 hyperglycemia and type 1 diabetes/type 1 diabetes with peripheral neuropathy  Questionable etiology. Patient with no signs or symptoms of infection. Chest x-ray was negative for any acute infiltrate. Urinalysis did not suggest a UTI. Patient noted to have a A1c of 12.4 and poorly controlled insulin-dependent type 1 diabetes mellitus. Patient with poor sleep hygiene which likely contributed to his brittle diabetes. Patient stated to Dr. Venetia Constable that he usually goes to bed around midnight when he takes his Lantus and then wakes up around 12:30 in the afternoon more than 12 hours of sleep and could potentially be going through a relative hypoglycemia related. Due to no food intake with readings given patient is false sense of security about sugar control when he eventually wakes up. Likely causing patient to take short amounts of insulin with his first meal results in a vicious cycle of  hyper and hypoglycemia.  Patient was admitted and placed in the step down unit as he was significantly hyperglycemic. Patient was placed on the glucose stabilizer and his blood sugars monitored as well as his  electrolytes. Patient was subsequently transitioned to subcutaneous insulin. Patient's CBGs improved patient was maintained on Lantus 11 units daily and meal coverage insulin of 3 units NovoLog was added. Patient's blood sugars remained in stable condition and he was maintained on a sliding scale insulin. Patient was followed by diabetes coordinator throughout the hospitalization. She will follow-up with his endocrinologist as outpatient.  #2 elevated CEA/ CA19-9 Patient noted to have a positive FOBT. MRCP abdomen and pelvis was done on 10/02/2015 that showed normal appearance of liver and biliary tree. 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 Trace left pleural effusion. Esophageal air-fluid levels suggests dysmotility and gastroesophageal reflux. Patient was seen in consultation by gastroenterology who had no clear cause for patient's elevated CEA and CA-19-9. It was recommended to optimize patient's diabetes control in the hospital follow-up with PCP and endocrinologist as outpatient. It was also recommended that patient follow-up with GI, Dr. Leone Payor in the outpatient setting in a few weeks. Patient will follow-up with GI.  #3 COPD exacerbation Patient was placed empirically on IV Levaquin as well as nebulizer treatments. Patient improved clinically. Patient subsequently started on Spiriva and Symbicort. Patient received a one-week course of antibiotic treatment. Outpatient follow-up.  #4 acute metabolic encephalopathy Likely secondary to problem #1. Patient with no signs or symptoms of infection. MRI head was negative for any acute abnormalities. Carotid Dopplers with no significant ICA stenosis. 2-D echo with EF of 55-60% with no wall motion abnormalities, grade 1 diastolic dysfunction. Patient with clinical improvement and likely at baseline. Follow.  #5 hypothyroidism TSH was 0.878 on 09/06/2015. Maintained on home regimen of Synthroid. Outpatient  follow-up.  #6 chronic low back pain Patient maintained on home regimen of pain management  #7 right renal mass Patient was seen by urology and recommended outpatient follow up, in urology clinic with surveillance repeat imaging in 3 months to characterize growth of mass..  #8 Hypogonadism Patient is to follow-up with urology in the outpatient setting to discuss resumption of patient's testosterone replacement therapy.     Procedures:  MRCP 10/02/2015 pending  CT head 09/27/2015  Chest x-ray 09/27/2015  2-D echo 09/29/2015  MRI head 09/28/2015  Carotid Dopplers 09/28/2015  Consultations:  Gastroenterology: Dr. Russella Dar 10/02/2015  Urology: Dr. Laverle Patter 10/02/2015  Discharge Exam: Filed Vitals:   10/02/15 2120 10/03/15 0429  BP: 144/99 139/58  Pulse: 74 65  Temp: 97.5 F (36.4 C) 97.9 F (36.6 C)  Resp: 18 16    General: NAD Cardiovascular: RRR Respiratory: CTAB  Discharge Instructions   Discharge Instructions    AMB Referral to [facility] Care Management    Complete by:  As directed   Please assign to [facility] LCSW for follow up at [facility]. Will make aware name of SNF facility once known. Written consent signed. Will need follow up with son regarding applying for Medicaid. Will need [facility] RNCM for DM symptom and disease management when patient discharges home. Hx of COPD as well. SNF will be ST. Has had x3 admits in past 6 months. Please call with questions. Thanks. Raiford Noble, MSN-Ed, [facility] Liaison-[phone]  Reason for consult:  Please assign to [facility] LCSW  Diagnoses of:   Diabetes COPD/ Pneumonia    Expected date of contact:  1-3 days (reserved  for hospital discharges)     Diet Carb Modified    Complete by:  As directed      Discharge instructions    Complete by:  As directed   Follow up with Dr Leone Payor, Gastroenterology as scheduled. Follow up with urology, Dr Isabel Caprice in 3 months.     Increase activity slowly    Complete by:  As directed            Current Discharge Medication List    START taking these medications   Details  budesonide-formoterol (SYMBICORT) 160-4.5 MCG/ACT inhaler Inhale 2 puffs into the lungs 2 (two) times daily. Qty: 1 Inhaler, Refills: 6    insulin aspart (NOVOLOG) 100 UNIT/ML injection Inject 3 Units into the skin 3 (three) times daily with meals. Qty: 10 mL, Refills: 6    tiotropium (SPIRIVA) 18 MCG inhalation capsule Place 1 capsule (18 mcg total) into inhaler and inhale daily. Qty: 30 capsule, Refills: 6      CONTINUE these medications which have CHANGED   Details  HYDROcodone-acetaminophen (NORCO/VICODIN) 5-325 MG tablet Take 1 tablet by mouth 4 (four) times daily as needed for severe pain. Qty: 20 tablet, Refills: 0    Insulin Glargine (TOUJEO SOLOSTAR) 300 UNIT/ML SOPN Inject 11 Units into the skin at bedtime. Qty: 1 pen, Refills: 0      CONTINUE these medications which have NOT CHANGED   Details  clopidogrel (PLAVIX) 75 MG tablet TAKE 1 TABLET BY MOUTH DAILY WITH BREAKFAST Qty: 30 tablet, Refills: 3    escitalopram (LEXAPRO) 20 MG tablet TAKE ONE TABLET BY MOUTH ONE TIME DAILY Qty: 30 tablet, Refills: 5    levETIRAcetam (KEPPRA) 750 MG tablet Take 1/2 tablet in morning. Take whole tablet at night. Qty: 60 tablet, Refills: 3   Associated Diagnoses: Absence attack (HCC); Partial symptomatic epilepsy with complex partial seizures, not intractable, without status epilepticus (HCC); Dementia arising in the senium and presenium    levothyroxine (SYNTHROID, LEVOTHROID) 75 MCG tablet Take 1 tablet (75 mcg total) by mouth daily. Qty: 30 tablet, Refills: 5    aspirin 81 MG tablet Take 81 mg by mouth at bedtime.     insulin lispro (HUMALOG) 100 UNIT/ML KiwkPen Inject into the skin as directed. Pt checks BC QID. Glucose Dosage Formula Per Physician Instructions. Current Blood Sugar-100/60 +  # of Carbs per meal/15=Amount of Insulin To Take.    metoprolol tartrate (LOPRESSOR) 25 MG  tablet Take One tablet by mouth twice daily to control BP Qty: 180 tablet, Refills: 0   Associated Diagnoses: Essential hypertension    Multiple Vitamin (MULTIVITAMIN WITH MINERALS) TABS tablet Take 1 tablet by mouth daily. Qty: 30 tablet, Refills: 3    ONE TOUCH ULTRA TEST test strip TEST 8 TIMES PER DAY FOR 30 DAYS Qty: 250 each, Refills: 4    pantoprazole (PROTONIX) 40 MG tablet TAKE ONE TABLET BY MOUTH ONE TIME DAILY Qty: 30 tablet, Refills: 5    pravastatin (PRAVACHOL) 20 MG tablet Take 1 tablet (20 mg total) by mouth every evening. NEEDS APPOINTMENT FOR FUTURE REFILLS OR 90-DAY SUPPLY Qty: 30 tablet, Refills: 0    pregabalin (LYRICA) 50 MG capsule Take one capsule by mouth in the morning and two capsules by mouth in the evening for pains. Qty: 90 capsule, Refills: 5    saccharomyces boulardii (FLORASTOR) 250 MG capsule Take 1 capsule (250 mg total) by mouth 2 (two) times daily. Qty: 60 capsule, Refills: 0    vitamin B-12 (CYANOCOBALAMIN) 1000 MCG tablet  Take 1,000 mcg by mouth daily.      STOP taking these medications     feeding supplement, GLUCERNA SHAKE, (GLUCERNA SHAKE) LIQD        Allergies  Allergen Reactions  . Ace Inhibitors Other (See Comments)    dizzy  . Cymbalta [Duloxetine Hcl] Other (See Comments)    dizzy  . Lipitor [Atorvastatin] Other (See Comments)    Caused pain all over body.  . Bee Venom Swelling  . Glucerna [Nutritional Supplements] Diarrhea  . Angiotensin Receptor Blockers Other (See Comments)    unknown   Follow-up Information    Follow up with Stan Head, MD On 12/02/2015.   Specialty:  Gastroenterology   Why:  1:30 PM to follow up with GI doctor.    Contact information:   [address] [place] [address] [phone]       Follow up with [facility] On 11/22/2015.   Why:  go to lab the week before the MD visit with Dr Leone Payor for lab work. open 8 to 5 weekdays.  in [place] basement.    Contact information:   [address] [place] [address]       Follow up with Valetta Fuller, MD. Schedule an appointment as soon as possible for a visit in 3 months.   Specialty:  Urology   Contact information:   [address] [place] [address] [phone]       Follow up with KERR,JEFFREY, MD. Schedule an appointment as soon as possible for a visit in 2 weeks.   Specialty:  Endocrinology   Why:  f/u in 2-3 weeks   Contact information:   [address] [place] [address] [phone]       Please follow up.   Why:  f/u with MD at SNF       The results of significant diagnostics from this hospitalization (including imaging, microbiology, ancillary and laboratory) are listed below for reference.    Significant Diagnostic Studies: Dg Chest 1 View  09/27/2015  CLINICAL DATA:  Hyperglycemia and altered mental status, COPD EXAM: CHEST  1 VIEW COMPARISON:  09/14/2015 FINDINGS: Cardiac shadow is within normal limits. The lungs are well aerated bilaterally. Elevation of the left hemidiaphragm is again seen. No focal infiltrate or sizable effusion is noted. IMPRESSION: No acute abnormality noted. Electronically Signed   By: Alcide Clever M.D.   On: 09/27/2015 18:56   Ct Head Wo Contrast  09/27/2015  CLINICAL DATA:  Acute onset of confusion and altered mental status. Hyperglycemia. Recent fall at home. Initial encounter. EXAM: CT HEAD WITHOUT CONTRAST TECHNIQUE: Contiguous axial images were obtained from the base of the skull through the vertex without intravenous contrast. COMPARISON:  CT of the head performed 04/30/2015 FINDINGS: There is no evidence of acute infarction, mass lesion, or intra- or extra-axial hemorrhage on CT. Prominence of the ventricles and sulci reflects mild to moderate cortical volume loss. Scattered periventricular white matter change likely reflects small vessel ischemic microangiopathy. Small chronic lacunar infarcts are suggested within the basal  ganglia bilaterally. The brainstem and fourth ventricle are within normal limits. The cerebral hemispheres demonstrate grossly normal gray-white differentiation. No mass effect or midline shift is seen. There is no evidence of fracture; visualized osseous structures are unremarkable in appearance. The orbits are within normal limits. The paranasal sinuses and mastoid air cells are well-aerated. No significant soft tissue abnormalities are seen. IMPRESSION: 1. No acute intracranial pathology seen on  CT. 2. Mild to moderate cortical volume loss and scattered small vessel ischemic microangiopathy. 3. Small chronic lacunar infarcts suggested within the basal ganglia bilaterally. Electronically Signed   By: Roanna Raider M.D.   On: 09/27/2015 18:42   Mr Laqueta Jean ZO Contrast  09/28/2015  CLINICAL DATA:  75 year old hypertensive diabetic male with hyperlipidemia presenting with reduced level of consciousness. Subsequent encounter. EXAM: MRI HEAD WITHOUT AND WITH CONTRAST TECHNIQUE: Multiplanar, multiecho pulse sequences of the brain and surrounding structures were obtained without and with intravenous contrast. CONTRAST:  11mL MULTIHANCE GADOBENATE DIMEGLUMINE 529 MG/ML IV SOLN COMPARISON:  09/27/2015 CT. FINDINGS: No acute infarct or intracranial hemorrhage. Mild small vessel disease type changes. Moderate global atrophy without hydrocephalus. No intracranial mass or abnormal enhancement. 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 Minimal exophthalmos. Cervical medullary junction, pituitary region and pineal region unremarkable. Major intracranial vascular structures are patent with ectatic vertebral arteries and basilar artery. Minimal partial opacification left mastoid air cells. Polypoid opacification left maxillary sinus with minimal mucosal thickening right maxillary sinus and ethmoid sinus air cells. IMPRESSION: No acute infarct or intracranial hemorrhage. Mild small vessel  disease type changes. Moderate global atrophy without hydrocephalus. No intracranial mass or abnormal enhancement. 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 Electronically Signed   By: Lacy Duverney M.D.   On: 09/28/2015 15:11   Mr 3d Recon At Scanner  10/02/2015  CLINICAL DATA:  Weight loss. Hypertension. Hyperlipidemia. CEA and CA 19 elevation. Chronic kidney disease. EXAM: MRI ABDOMEN WITHOUT AND WITH CONTRAST (INCLUDING MRCP) TECHNIQUE: Multiplanar multisequence MR imaging of the abdomen was performed both before and after the administration of intravenous contrast. Heavily T2-weighted images of the biliary and pancreatic ducts were obtained, and three-dimensional MRCP images were rendered by post processing. CONTRAST:  11mL MULTIHANCE GADOBENATE DIMEGLUMINE 529 MG/ML IV SOLN COMPARISON:  Aortic ultrasound of 03/11/2014.  No prior CT or MRI. FINDINGS: Mild motion degradation throughout. Lower chest: Small left pleural effusion. Fluid level in a distal esophagus, including on image 7/ series 3. Hepatobiliary: Normal liver. Normal gallbladder. No intra or extrahepatic biliary duct dilatation. Normal common duct, 5 mm on image 44/series 4. Periampullary duodenal diverticulum. No evidence of obstructive mass or stone. Pancreas: Mild pancreatic atrophy, without mass or duct dilatation, given mild motion degradation. Spleen: Normal in size, without focal abnormality. Adrenals/Urinary Tract: Normal adrenal glands. Normal left kidney. Posterior interpolar right renal cyst of 11 mm. Anterior interpolar right renal lesion demonstrates T2 hypointensity on image 27/ series 3 and measures 1.7 x 1.6 cm on image 47 of series 10960. Precontrast hypointense relative to the remainder of the kidney, with mild post-contrast enhancement, including on image 51/1105. No hydronephrosis. Stomach/Bowel: Normal stomach and abdominal bowel loops. Vascular/Lymphatic: Patent right renal vein. Aortic  and branch vessel atherosclerosis. No retroperitoneal or retrocrural adenopathy. Other: No ascites. Musculoskeletal: No acute osseous abnormality. IMPRESSION: 1. Motion degraded exam. 2. No explanation for weight loss. Normal appearance of the liver and biliary tree. 3. 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 Lipid poor angiomyolipoma could look similar. 4. Trace left pleural fluid. 5. Esophageal air fluid level suggests dysmotility or gastroesophageal reflux. These results will be called to the ordering clinician or representative by the Radiologist Assistant, and communication documented in the PACS or zVision Dashboard. Electronically Signed   By: Jeronimo Greaves M.D.   On: 10/02/2015 11:48   Portable Chest X-ray (1 View)  09/14/2015  CLINICAL DATA:  Diabetic ketoacidosis, acute onset. Patient has not eaten or had insulin in 2 days. Initial encounter. EXAM: PORTABLE CHEST 1 VIEW COMPARISON:  Chest radiograph performed 09/06/2015 FINDINGS: The lungs are well-aerated. There is elevation of the left hemidiaphragm. There is no evidence of focal opacification, pleural effusion or pneumothorax. The cardiomediastinal silhouette is within normal limits. No acute osseous abnormalities are seen. IMPRESSION: Elevation of the left hemidiaphragm.  Lungs remain grossly clear. Electronically Signed   By: Roanna Raider M.D.   On: 09/14/2015 00:47   Dg Chest Port 1 View  09/07/2015  CLINICAL DATA:  Acute onset of cough.  Initial encounter. EXAM: PORTABLE CHEST 1 VIEW COMPARISON:  Chest radiograph performed 03/10/2014 FINDINGS: The lungs are well-aerated. There is mild elevation of the left hemidiaphragm. Minimal bilateral atelectasis is noted. There is no evidence of pleural effusion or pneumothorax. The cardiomediastinal silhouette is within normal limits. No acute osseous abnormalities are seen. IMPRESSION: Mild elevation of the left hemidiaphragm. Minimal bilateral  atelectasis noted. Electronically Signed   By: Roanna Raider M.D.   On: 09/07/2015 00:40   Mr Abd W/wo Cm/mrcp  10/02/2015  CLINICAL DATA:  Weight loss. Hypertension. Hyperlipidemia. CEA and CA 19 elevation. Chronic kidney disease. EXAM: MRI ABDOMEN WITHOUT AND WITH CONTRAST (INCLUDING MRCP) TECHNIQUE: Multiplanar multisequence MR imaging of the abdomen was performed both before and after the administration of intravenous contrast. Heavily T2-weighted images of the biliary and pancreatic ducts were obtained, and three-dimensional MRCP images were rendered by post processing. CONTRAST:  11mL MULTIHANCE GADOBENATE DIMEGLUMINE 529 MG/ML IV SOLN COMPARISON:  Aortic ultrasound of 03/11/2014.  No prior CT or MRI. FINDINGS: Mild motion degradation throughout. Lower chest: Small left pleural effusion. Fluid level in a distal esophagus, including on image 7/ series 3. Hepatobiliary: Normal liver. Normal gallbladder. No intra or extrahepatic biliary duct dilatation. Normal common duct, 5 mm on image 44/series 4. Periampullary duodenal diverticulum. No evidence of obstructive mass or stone. Pancreas: Mild pancreatic atrophy, without mass or duct dilatation, given mild motion degradation. Spleen: Normal in size, without focal abnormality. Adrenals/Urinary Tract: Normal adrenal glands. Normal left kidney. Posterior interpolar right renal cyst of 11 mm. Anterior interpolar right renal lesion demonstrates T2 hypointensity on image 27/ series 3 and measures 1.7 x 1.6 cm on image 47 of series 16109. Precontrast hypointense relative to the remainder of the kidney, with mild post-contrast enhancement, including on image 51/1105. No hydronephrosis. Stomach/Bowel: Normal stomach and abdominal bowel loops. Vascular/Lymphatic: Patent right renal vein. Aortic and branch vessel atherosclerosis. No retroperitoneal or retrocrural adenopathy. Other: No ascites. Musculoskeletal: No acute osseous abnormality. IMPRESSION: 1. Motion degraded  exam. 2. No explanation for weight loss. Normal appearance of the liver and biliary tree. 3. 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 Lipid poor angiomyolipoma could look similar. 4. Trace left pleural fluid. 5. Esophageal air fluid level suggests dysmotility or gastroesophageal reflux. These results will be called to the ordering clinician or representative by the Radiologist Assistant, and communication documented in the PACS or zVision Dashboard. Electronically Signed   By: Jeronimo Greaves M.D.   On: 10/02/2015 11:48    Microbiology: Recent Results (from the past 240 hour(s))  Urine culture     Status: None   Collection Time: 09/27/15  4:57 PM  Result Value Ref Range Status   Specimen Description URINE, CLEAN CATCH  Final   Special Requests NONE  Final   Culture   Final    10,000 COLONIES/mL  STAPHYLOCOCCUS SPECIES (COAGULASE NEGATIVE) Performed at [facility]    Report Status 09/30/2015 FINAL  Final   Organism ID, Bacteria STAPHYLOCOCCUS SPECIES (COAGULASE NEGATIVE)  Final      Susceptibility   Staphylococcus species (coagulase negative) - MIC*    CIPROFLOXACIN >=8 RESISTANT Resistant     GENTAMICIN <=0.5 SENSITIVE Sensitive     NITROFURANTOIN <=16 SENSITIVE Sensitive     OXACILLIN >=4 RESISTANT Resistant     TETRACYCLINE >=16 RESISTANT Resistant     VANCOMYCIN 2 SENSITIVE Sensitive     TRIMETH/SULFA <=10 SENSITIVE Sensitive     CLINDAMYCIN >=8 RESISTANT Resistant     RIFAMPIN <=0.5 SENSITIVE Sensitive     Inducible Clindamycin NEGATIVE Sensitive     * 10,000 COLONIES/mL STAPHYLOCOCCUS SPECIES (COAGULASE NEGATIVE)  MRSA PCR Screening     Status: Abnormal   Collection Time: 09/27/15 10:02 PM  Result Value Ref Range Status   MRSA by PCR INVALID RESULTS, SPECIMEN SENT FOR CULTURE (A) NEGATIVE Final    Comment:        The GeneXpert MRSA Assay (FDA approved for NASAL specimens only), is one component of a comprehensive MRSA  colonization surveillance program. It is not intended to diagnose MRSA infection nor to guide or monitor treatment for MRSA infections. Brayton Layman RN 1610 09/28/15 A NAVARRO   MRSA culture     Status: None   Collection Time: 09/27/15 10:02 PM  Result Value Ref Range Status   Specimen Description NOSE  Final   Special Requests NONE  Final   Culture NOMRSA Performed at [facility]   Final   Report Status 09/29/2015 FINAL  Final  C difficile quick scan w PCR reflex     Status: None   Collection Time: 09/30/15  8:38 AM  Result Value Ref Range Status   C Diff antigen NEGATIVE NEGATIVE Final   C Diff toxin NEGATIVE NEGATIVE Final   C Diff interpretation Negative for toxigenic C. difficile  Final     Labs: Basic Metabolic Panel:  Recent Labs Lab 09/28/15 0511 09/29/15 0450 09/30/15 0420 10/01/15 0414 10/02/15 0405 10/02/15 1749 10/03/15 0410  NA 138 139 142 144 140  --  143  K 5.5* 3.7 4.3 4.2 4.3  --  4.0  CL 105 110 108 110 107  --  109  CO2 --  26  GLUCOSE 316* 159* 126* 118* 225* 463* 87  BUN 32* 26* --  19  CREATININE 1.24 1.21 0.90 1.11 0.94  --  1.16  CALCIUM 8.7* 8.5* 8.5* 8.7* 8.9  --  9.0  MG 1.6*  --   --   --   --   --   --   PHOS 2.9  --   --   --   --   --   --    Liver Function Tests:  Recent Labs Lab 09/27/15 1543 09/28/15 0511 09/29/15 0450  AST ALT 39 32 27  ALKPHOS 77 68 68  BILITOT 1.1 0.8 0.2*  PROT 6.2* 5.7* 5.7*  ALBUMIN 3.4* 2.9* 3.1*   No results for input(s): LIPASE, AMYLASE in the last 168 hours. No results for input(s): AMMONIA in the last 168 hours. CBC:  Recent Labs Lab 09/29/15 0450 09/30/15 0420 10/01/15 0414 10/02/15 0405 10/03/15 0410  WBC 5.2 5.3 5.1 5.6 5.6  HGB 10.0* 9.3* 9.0* 9.4* 8.6*  HCT 30.2* 28.4* 27.2* 29.0* 26.8*  MCV  95.6 94.0 97.5 97.3 98.2  PLT 192 183 164 193 192   Cardiac Enzymes: No results for input(s): CKTOTAL, CKMB, CKMBINDEX, TROPONINI in  the last 168 hours. BNP: BNP (last 3 results) No results for input(s): BNP in the last 8760 hours.  ProBNP (last 3 results) No results for input(s): PROBNP in the last 8760 hours.  CBG:  Recent Labs Lab 10/02/15 2119 10/03/15 0044 10/03/15 0427 10/03/15 0732 10/03/15 1142  GLUCAP 266* 166* 74 140* 193*       Signed:  Alaster Asfaw MD.  [facility] 10/03/2015, 12:48 PM

## 2015-10-03 NOTE — Progress Notes (Signed)
Pt for discharge to [facility].  CSW facilitated pt discharge needs including contacting facility, faxing pt discharge information via [employer], discussing with pt at bedside and pt son, Ardelia Mems via telephone, providing RN phone number to call report, providing discharge packet to pt at bedside to provide to [place] upon arrival. Pt son, Ardelia Mems plans to transport via private vehicle.  Pt and pt son, Ardelia Mems appreciative of CSW support and assistance.   No further social work needs identified at this time.  CSW signing off.   Loletta Specter, MSW, LCSW Clinical Social Work [phone]

## 2015-10-03 NOTE — Clinical Social Work Placement (Signed)
   CLINICAL SOCIAL WORK PLACEMENT  NOTE  Date:  10/03/2015  Patient Details  Name: Benjamin Hodge MRN: [id number] Date of Birth: May 07, 1941  Clinical Social Work is seeking post-discharge placement for this patient at the Skilled  Nursing Facility level of care (*CSW will initial, date and re-position this form in  chart as items are completed):  Yes   Patient/family provided with [facility] Clinical Social Work Department's list of facilities offering this level of care within the geographic area requested by the patient (or if unable, by the patient's family).  Yes   Patient/family informed of their freedom to choose among providers that offer the needed level of care, that participate in Medicare, Medicaid or managed care program needed by the patient, have an available bed and are willing to accept the patient.  Yes   Patient/family informed of [facility]'s ownership interest in [facility] and [facility], as well as of the fact that they are under no obligation to receive care at these facilities.  PASRR submitted to EDS on 09/30/15     PASRR number received on 09/30/15     Existing PASRR number confirmed on       FL2 transmitted to all facilities in geographic area requested by pt/family on 09/30/15     FL2 transmitted to all facilities within larger geographic area on       Patient informed that his/her managed care company has contracts with or will negotiate with certain facilities, including the following:        Yes   Patient/family informed of bed offers received.  Patient chooses bed at [facility]     Physician recommends and patient chooses bed at      Patient to be transferred to [facility] on 10/03/15.  Patient to be transferred to facility by pt son via private vehicle      Patient family notified on 10/03/15 of transfer.  Name of family member notified:  pt and pt son, Ardelia Mems notified     PHYSICIAN Please  sign FL2     Additional Comment:    _______________________________________________ Orson Eva, LCSW 10/03/2015, 2:46 PM

## 2015-10-04 ENCOUNTER — Encounter: Payer: Self-pay | Admitting: Internal Medicine

## 2015-10-04 DIAGNOSIS — N179 Acute kidney failure, unspecified: Secondary | ICD-10-CM | POA: Diagnosis not present

## 2015-10-04 DIAGNOSIS — M549 Dorsalgia, unspecified: Secondary | ICD-10-CM | POA: Diagnosis not present

## 2015-10-04 DIAGNOSIS — E1140 Type 2 diabetes mellitus with diabetic neuropathy, unspecified: Secondary | ICD-10-CM | POA: Diagnosis not present

## 2015-10-04 DIAGNOSIS — R739 Hyperglycemia, unspecified: Secondary | ICD-10-CM | POA: Diagnosis not present

## 2015-10-05 DIAGNOSIS — I10 Essential (primary) hypertension: Secondary | ICD-10-CM | POA: Diagnosis not present

## 2015-10-05 DIAGNOSIS — J449 Chronic obstructive pulmonary disease, unspecified: Secondary | ICD-10-CM | POA: Diagnosis not present

## 2015-10-05 DIAGNOSIS — E039 Hypothyroidism, unspecified: Secondary | ICD-10-CM | POA: Diagnosis not present

## 2015-10-05 DIAGNOSIS — E109 Type 1 diabetes mellitus without complications: Secondary | ICD-10-CM | POA: Diagnosis not present

## 2015-10-08 ENCOUNTER — Ambulatory Visit: Payer: Self-pay | Admitting: *Deleted

## 2015-10-08 ENCOUNTER — Other Ambulatory Visit: Payer: Self-pay | Admitting: Internal Medicine

## 2015-10-09 DIAGNOSIS — R569 Unspecified convulsions: Secondary | ICD-10-CM | POA: Diagnosis not present

## 2015-10-09 DIAGNOSIS — J449 Chronic obstructive pulmonary disease, unspecified: Secondary | ICD-10-CM | POA: Diagnosis not present

## 2015-10-09 DIAGNOSIS — N2889 Other specified disorders of kidney and ureter: Secondary | ICD-10-CM | POA: Diagnosis not present

## 2015-10-09 DIAGNOSIS — E109 Type 1 diabetes mellitus without complications: Secondary | ICD-10-CM | POA: Diagnosis not present

## 2015-10-11 ENCOUNTER — Telehealth: Payer: Self-pay | Admitting: *Deleted

## 2015-10-11 ENCOUNTER — Other Ambulatory Visit: Payer: Self-pay | Admitting: *Deleted
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
## 2015-10-14 ENCOUNTER — Telehealth: Payer: Self-pay | Admitting: Neurology
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
## 2015-10-14 NOTE — Telephone Encounter (Signed)
Discussed the situation with his daughter-in-law. Patient was discharged from [facility]. He is being cared for, at the present time, at his son's house. His daughter-in-law says that blood sugars are coming under better control. He remains very weak. They're seeking assisted living facilities to place him in the future. He is to be seen to [phone].

## 2015-10-14 NOTE — Telephone Encounter (Signed)
Pt's daughter in law Trula Ore called and states pt has had an increase in seizure activity. He is currently living at home with them and will be going to an assisted living soon. She has made and appt for [facility]nd for the pt. She is requesting a call to talk about other changes she has noticed with him as well. She said it is lengthy and detailed and would rather speak with the nurse or Dr. Vickey Huger. Please call and advise [phone]

## 2015-10-14 NOTE — Patient Outreach (Signed)
[facility] Customer service manager [facility]) Care Management  10/14/2015  REAKWON BLESSINGER [birth year] [id number]   Erroneous encounter and scheduled patient visit.  Danford Bad, BSW, MSW, LCSW  Licensed Restaurant manager, fast food Health System  Mailing [place] [address] [place], [place] [address] Physical Address-[address], [place], [place] [address] Toll Free Main # [phone] Fax # [phone] Cell # [phone]  Fax # [phone]  [email or web address]    [facility] [employer] complies with [employer] civil rights laws and does not discriminate on the basis of race, color, national origin, age, disability, or sex.  Espaol (Spanish)  [facility] Customer service manager cumple con las leyes federales de derechos civiles aplicables y no discrimina por motivos de raza, color, nacionalidad, edad, discapacidad o sexo.     [place] ([place]  [facility] Art gallery manager th? lu?t dn quy?n hi?n hnh c?a Lin bang v khng phn bi?t ?i x? d?a trn ch?ng t?c, mu da, ngu?n g?c qu?c gia, ? tu?i, khuy?t t?t, ho?c gi?i tnh.     (Arabic)    [facility] Customer service manager

## 2015-10-16 ENCOUNTER — Encounter: Payer: Self-pay | Admitting: Internal Medicine

## 2015-10-16 ENCOUNTER — Encounter: Payer: Self-pay | Admitting: *Deleted

## 2015-10-16 ENCOUNTER — Ambulatory Visit (INDEPENDENT_AMBULATORY_CARE_PROVIDER_SITE_OTHER): Payer: Medicare Other | Admitting: Internal Medicine

## 2015-10-16 ENCOUNTER — Other Ambulatory Visit: Payer: Self-pay | Admitting: *Deleted

## 2015-10-16 VITALS — BP 114/60 | HR 65 | Temp 97.6°F | Ht 67.0 in | Wt 126.0 lb

## 2015-10-16 DIAGNOSIS — R978 Other abnormal tumor markers: Secondary | ICD-10-CM | POA: Diagnosis not present

## 2015-10-16 DIAGNOSIS — N2889 Other specified disorders of kidney and ureter: Secondary | ICD-10-CM

## 2015-10-16 DIAGNOSIS — I10 Essential (primary) hypertension: Secondary | ICD-10-CM | POA: Diagnosis not present

## 2015-10-16 DIAGNOSIS — F068 Other specified mental disorders due to known physiological condition: Secondary | ICD-10-CM | POA: Diagnosis not present

## 2015-10-16 DIAGNOSIS — R197 Diarrhea, unspecified: Secondary | ICD-10-CM

## 2015-10-16 DIAGNOSIS — E1051 Type 1 diabetes mellitus with diabetic peripheral angiopathy without gangrene: Secondary | ICD-10-CM

## 2015-10-16 DIAGNOSIS — D638 Anemia in other chronic diseases classified elsewhere: Secondary | ICD-10-CM

## 2015-10-16 DIAGNOSIS — E039 Hypothyroidism, unspecified: Secondary | ICD-10-CM | POA: Diagnosis not present

## 2015-10-16 DIAGNOSIS — M545 Low back pain, unspecified: Secondary | ICD-10-CM

## 2015-10-16 DIAGNOSIS — R970 Elevated carcinoembryonic antigen [CEA]: Secondary | ICD-10-CM | POA: Diagnosis not present

## 2015-10-16 DIAGNOSIS — F0390 Unspecified dementia without behavioral disturbance: Secondary | ICD-10-CM

## 2015-10-16 DIAGNOSIS — E291 Testicular hypofunction: Secondary | ICD-10-CM

## 2015-10-16 MED ORDER — DIPHENOXYLATE-ATROPINE 2.5-0.025 MG PO TABS: ORAL_TABLET | ORAL | Status: DC

## 2015-10-16 MED ORDER — ZOSTER VACCINE LIVE 19400 UNT/0.65ML ~~LOC~~ SOLR: 0.6500 mL | Freq: Once | SUBCUTANEOUS | Status: DC

## 2015-10-16 MED ORDER — BUPRENORPHINE 7.5 MCG/HR TD PTWK: MEDICATED_PATCH | TRANSDERMAL | Status: DC

## 2015-10-16 NOTE — Patient Outreach (Signed)
[facility]) Care Management  [facility] Social Work  10/16/2015  Benjamin Hodge 12/26/1940 [id number]  Subjective:    "Talk to my son, Benjamin Hodge.  He knows what the doctors recommended while I was in the hospital.  I just know that I'm here to get some therapy before I go home".  Objective:   CSW agreed to follow patient while at [facility] where patient currently resides to receive short-term rehabilitative services, to assess and assist with possible discharge planning needs and services.  Current Medications:  Current Outpatient Prescriptions  Medication Sig Dispense Refill  . aspirin 81 MG tablet Take 81 mg by mouth at bedtime.     . budesonide-formoterol (SYMBICORT) 160-4.5 MCG/ACT inhaler Inhale 2 puffs into the lungs 2 (two) times daily. 1 Inhaler 6  . clopidogrel (PLAVIX) 75 MG tablet TAKE 1 TABLET BY MOUTH DAILY WITH BREAKFAST 30 tablet 3  . escitalopram (LEXAPRO) 20 MG tablet TAKE ONE TABLET BY MOUTH ONE TIME DAILY 30 tablet 5  . HYDROcodone-acetaminophen (NORCO/VICODIN) 5-325 MG tablet Take 1 tablet by mouth 4 (four) times daily as needed for severe pain. 20 tablet 0  . insulin aspart (NOVOLOG) 100 UNIT/ML injection Inject 3 Units into the skin 3 (three) times daily with meals. 10 mL 6  . Insulin Glargine (TOUJEO SOLOSTAR) 300 UNIT/ML SOPN Inject 11 Units into the skin at bedtime. 1 pen 0  . insulin lispro (HUMALOG) 100 UNIT/ML KiwkPen Inject into the skin as directed. Pt checks BC QID. Glucose Dosage Formula Per Physician Instructions. Current Blood Sugar-100/60 +  # of Carbs per meal/15=Amount of Insulin To Take.    . levETIRAcetam (KEPPRA) 750 MG tablet Take 1/2 tablet in morning. Take whole tablet at night. (Patient taking differently: Take 375-750 mg by mouth 2 (two) times daily. Take 1/2 tablet in morning. Take whole tablet at night.) 60 tablet 3  . levothyroxine (SYNTHROID, LEVOTHROID) 75 MCG  tablet Take 1 tablet (75 mcg total) by mouth daily. 30 tablet 5  . metoprolol tartrate (LOPRESSOR) 25 MG tablet Take One tablet by mouth twice daily to control BP 180 tablet 0  . Multiple Vitamin (MULTIVITAMIN WITH MINERALS) TABS tablet Take 1 tablet by mouth daily. (Patient not taking: Reported on 09/27/2015) 30 tablet 3  . ONE TOUCH ULTRA TEST test strip TEST 8 TIMES PER DAY FOR 30 DAYS 250 each 4  . pantoprazole (PROTONIX) 40 MG tablet TAKE ONE TABLET BY MOUTH ONE TIME DAILY 30 tablet 5  . pravastatin (PRAVACHOL) 20 MG tablet Take 1 tablet (20 mg total) by mouth every evening. NEEDS APPOINTMENT FOR FUTURE REFILLS OR 90-DAY SUPPLY 30 tablet 0  . pregabalin (LYRICA) 50 MG capsule Take one capsule by mouth in the morning and two capsules by mouth in the evening for pains. 90 capsule 5  . saccharomyces boulardii (FLORASTOR) 250 MG capsule Take 1 capsule (250 mg total) by mouth 2 (two) times daily. (Patient taking differently: Take 250-500 mg by mouth every evening. ) 60 capsule 0  . tiotropium (SPIRIVA) 18 MCG inhalation capsule Place 1 capsule (18 mcg total) into inhaler and inhale daily. 30 capsule 6  . vitamin B-12 (CYANOCOBALAMIN) 1000 MCG tablet Take 1,000 mcg by mouth daily.     No current facility-administered medications for this visit.    Functional Status:  In your present state of health, do you have any difficulty performing the following activities: 10/16/2015 09/27/2015  Hearing? N N  Vision? N N  Difficulty concentrating or making decisions? N N  Walking or climbing stairs? Y Y  Dressing or bathing? Y N  Doing errands, shopping? [name]  Preparing Food and eating ? Y -  Using the Toilet? Y -  In the past six months, have you accidently leaked urine? N -  Do you have problems with loss of bowel control? N -  Managing your Medications? Y -  Managing your Finances? Y -  Housekeeping or managing your Housekeeping? Y -    Fall/Depression Screening:  PHQ 2/9 Scores 10/16/2015 01/02/2015  08/13/2014 03/28/2014 10/10/2013 04/20/2013  PHQ - 2 Score 2 3 0 0 0 0  PHQ- 9 Score 5 12 - - - -    Assessment:   CSW was able to make contact with patient today to perform the initial assessment, as well as assess and assist with social work needs and services.  CSW met with patient at [facility] [place] where patient currently resides to receive short-term rehabilitative services.  CSW introduced self, explained role and types of services provided through [facility] ([facility]).  CSW further explained to patient that CSW works with patient's RNCM, also with [facility], [name]. CSW then explained the reason for the call, indicating that [name] thought that patient would benefit from social work services and resources to assist with discharge planning needs and services from the skilled facility.  CSW obtained two HIPAA compliant identifiers from patient, which included patient's name and date of birth. Patient admitted that he essentially lives at home alone, even though his granddaughter lives one floor above him in his home.  Patient reported that his granddaughter "comes and goes", and that half the time he does not even know whether or not she is even there.  Patient's granddaughter does not assist patient with activities of daily living, nor is patient's granddaughter a source of transportation for patient to and from his physician appointments.  Patient encouraged CSW to contact his son, Benjamin Hodge to discuss patient's plan of care while at [facility], as well as patient's discharge plans when he returns home. CSW voiced understanding and was agreeable to this plan.  Plan:   CSW will plan to attend patient's Discharge Planning Meeting at [facility] [place] where patient currently resides to receive short-term  rehabilitative services, scheduled for Wednesday, October 30, 2015 at 9:00am.   CSW will converse with patient's RNCM with [facility], [name] to report findings of initial visit with patient today.   CSW will fax a barriers letter and correspondence letter to patient's Primary Care Physician, Dr. Jeanmarie Hubert to ensure that Dr. Nyoka Cowden is aware of CSW's involvement with patient's care. CSW will prescribe and print EMMI information to review with patient at the next scheduled visit.  Nat Christen, BSW, MSW, LCSW  Licensed Education officer, environmental [facility] System  Mailing [place] [address], [place], [place] [address] Physical Address-[address], [place], [place] [address] Toll Free Main # [phone] Fax # [phone] Cell # [phone]  Fax # [phone]  [email or web address] complies with [employer] civil rights laws and does not discriminate on the basis of race, color, national origin, age, disability, or sex.  Espaol (Spanish)  [facility] cumple con las leyes federales de derechos civiles aplicables y no discrimina por motivos  de raza, color, nacionalidad, edad, discapacidad o sexo.     [place] ([place]  [place] tun th? lu?t dn quy?n hi?n hnh c?a Lin bang v khng phn bi?t ?i x? d?a trn ch?ng t?c, mu da, ngu?n g?c qu?c gia, ? tu?i, khuy?t t?t, ho?c gi?i tnh.     (Arabic)    [place]
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
## 2015-10-17 ENCOUNTER — Other Ambulatory Visit: Payer: Self-pay | Admitting: *Deleted

## 2015-10-17 ENCOUNTER — Encounter: Payer: Self-pay | Admitting: *Deleted

## 2015-10-18 ENCOUNTER — Inpatient Hospital Stay (HOSPITAL_COMMUNITY): Payer: Medicare Other

## 2015-10-18 ENCOUNTER — Inpatient Hospital Stay (HOSPITAL_COMMUNITY)
Admission: EM | Admit: 2015-10-18 | Discharge: 2015-10-21 | DRG: 637 | Disposition: A | Discharge status: Nursing Home (Includes ICF) | Payer: Medicare Other | Attending: Internal Medicine | Admitting: Internal Medicine

## 2015-10-18 ENCOUNTER — Encounter (HOSPITAL_COMMUNITY): Payer: Self-pay | Admitting: Emergency Medicine

## 2015-10-18 DIAGNOSIS — Y95 Nosocomial condition: Secondary | ICD-10-CM | POA: Diagnosis present

## 2015-10-18 DIAGNOSIS — R7309 Other abnormal glucose: Secondary | ICD-10-CM | POA: Diagnosis not present

## 2015-10-18 DIAGNOSIS — R569 Unspecified convulsions: Secondary | ICD-10-CM | POA: Diagnosis not present

## 2015-10-18 DIAGNOSIS — F0390 Unspecified dementia without behavioral disturbance: Secondary | ICD-10-CM | POA: Diagnosis present

## 2015-10-18 DIAGNOSIS — D638 Anemia in other chronic diseases classified elsewhere: Secondary | ICD-10-CM | POA: Diagnosis not present

## 2015-10-18 DIAGNOSIS — I252 Old myocardial infarction: Secondary | ICD-10-CM | POA: Diagnosis not present

## 2015-10-18 DIAGNOSIS — J690 Pneumonitis due to inhalation of food and vomit: Secondary | ICD-10-CM | POA: Diagnosis present

## 2015-10-18 DIAGNOSIS — Z9181 History of falling: Secondary | ICD-10-CM

## 2015-10-18 DIAGNOSIS — I129 Hypertensive chronic kidney disease with stage 1 through stage 4 chronic kidney disease, or unspecified chronic kidney disease: Secondary | ICD-10-CM | POA: Diagnosis not present

## 2015-10-18 DIAGNOSIS — F329 Major depressive disorder, single episode, unspecified: Secondary | ICD-10-CM | POA: Diagnosis present

## 2015-10-18 DIAGNOSIS — J189 Pneumonia, unspecified organism: Secondary | ICD-10-CM | POA: Diagnosis not present

## 2015-10-18 DIAGNOSIS — Z6822 Body mass index (BMI) 22.0-22.9, adult: Secondary | ICD-10-CM | POA: Diagnosis not present

## 2015-10-18 DIAGNOSIS — E1049 Type 1 diabetes mellitus with other diabetic neurological complication: Secondary | ICD-10-CM | POA: Diagnosis not present

## 2015-10-18 DIAGNOSIS — E1051 Type 1 diabetes mellitus with diabetic peripheral angiopathy without gangrene: Secondary | ICD-10-CM | POA: Diagnosis present

## 2015-10-18 DIAGNOSIS — D509 Iron deficiency anemia, unspecified: Secondary | ICD-10-CM | POA: Diagnosis not present

## 2015-10-18 DIAGNOSIS — E860 Dehydration: Secondary | ICD-10-CM | POA: Diagnosis present

## 2015-10-18 DIAGNOSIS — R64 Cachexia: Secondary | ICD-10-CM | POA: Diagnosis present

## 2015-10-18 DIAGNOSIS — I2510 Atherosclerotic heart disease of native coronary artery without angina pectoris: Secondary | ICD-10-CM | POA: Diagnosis present

## 2015-10-18 DIAGNOSIS — F068 Other specified mental disorders due to known physiological condition: Secondary | ICD-10-CM | POA: Diagnosis not present

## 2015-10-18 DIAGNOSIS — J440 Chronic obstructive pulmonary disease with acute lower respiratory infection: Secondary | ICD-10-CM | POA: Diagnosis not present

## 2015-10-18 DIAGNOSIS — E103299 Type 1 diabetes mellitus with mild nonproliferative diabetic retinopathy without macular edema, unspecified eye: Secondary | ICD-10-CM | POA: Diagnosis present

## 2015-10-18 DIAGNOSIS — Z7902 Long term (current) use of antithrombotics/antiplatelets: Secondary | ICD-10-CM

## 2015-10-18 DIAGNOSIS — E785 Hyperlipidemia, unspecified: Secondary | ICD-10-CM | POA: Diagnosis not present

## 2015-10-18 DIAGNOSIS — R739 Hyperglycemia, unspecified: Secondary | ICD-10-CM | POA: Diagnosis not present

## 2015-10-18 DIAGNOSIS — Z794 Long term (current) use of insulin: Secondary | ICD-10-CM

## 2015-10-18 DIAGNOSIS — I959 Hypotension, unspecified: Secondary | ICD-10-CM | POA: Diagnosis present

## 2015-10-18 DIAGNOSIS — F419 Anxiety disorder, unspecified: Secondary | ICD-10-CM | POA: Diagnosis present

## 2015-10-18 DIAGNOSIS — E039 Hypothyroidism, unspecified: Secondary | ICD-10-CM | POA: Diagnosis present

## 2015-10-18 DIAGNOSIS — J449 Chronic obstructive pulmonary disease, unspecified: Secondary | ICD-10-CM | POA: Diagnosis not present

## 2015-10-18 DIAGNOSIS — G9340 Encephalopathy, unspecified: Secondary | ICD-10-CM | POA: Diagnosis not present

## 2015-10-18 DIAGNOSIS — E1042 Type 1 diabetes mellitus with diabetic polyneuropathy: Secondary | ICD-10-CM | POA: Diagnosis present

## 2015-10-18 DIAGNOSIS — G8929 Other chronic pain: Secondary | ICD-10-CM | POA: Diagnosis not present

## 2015-10-18 DIAGNOSIS — E1022 Type 1 diabetes mellitus with diabetic chronic kidney disease: Secondary | ICD-10-CM | POA: Diagnosis not present

## 2015-10-18 DIAGNOSIS — Z888 Allergy status to other drugs, medicaments and biological substances status: Secondary | ICD-10-CM

## 2015-10-18 DIAGNOSIS — Z7982 Long term (current) use of aspirin: Secondary | ICD-10-CM

## 2015-10-18 DIAGNOSIS — R001 Bradycardia, unspecified: Secondary | ICD-10-CM | POA: Diagnosis not present

## 2015-10-18 DIAGNOSIS — N182 Chronic kidney disease, stage 2 (mild): Secondary | ICD-10-CM | POA: Diagnosis not present

## 2015-10-18 DIAGNOSIS — G40209 Localization-related (focal) (partial) symptomatic epilepsy and epileptic syndromes with complex partial seizures, not intractable, without status epilepticus: Secondary | ICD-10-CM | POA: Diagnosis present

## 2015-10-18 DIAGNOSIS — E1165 Type 2 diabetes mellitus with hyperglycemia: Secondary | ICD-10-CM | POA: Diagnosis present

## 2015-10-18 DIAGNOSIS — I739 Peripheral vascular disease, unspecified: Secondary | ICD-10-CM | POA: Diagnosis present

## 2015-10-18 DIAGNOSIS — IMO0002 Reserved for concepts with insufficient information to code with codable children: Secondary | ICD-10-CM | POA: Diagnosis present

## 2015-10-18 DIAGNOSIS — E1065 Type 1 diabetes mellitus with hyperglycemia: Secondary | ICD-10-CM | POA: Diagnosis not present

## 2015-10-18 DIAGNOSIS — I10 Essential (primary) hypertension: Secondary | ICD-10-CM | POA: Diagnosis present

## 2015-10-18 DIAGNOSIS — R531 Weakness: Secondary | ICD-10-CM | POA: Diagnosis not present

## 2015-10-18 DIAGNOSIS — D649 Anemia, unspecified: Secondary | ICD-10-CM | POA: Diagnosis present

## 2015-10-18 LAB — CBC WITH DIFFERENTIAL/PLATELET
Basophils Absolute: 0 10*3/uL (ref 0.0–0.1)
Basophils Relative: 1 %
EOS ABS: 0.1 10*3/uL (ref 0.0–0.7)
EOS PCT: 2 %
HCT: 30 % — ABNORMAL LOW (ref 39.0–52.0)
Hemoglobin: 10.2 g/dL — ABNORMAL LOW (ref 13.0–17.0)
LYMPHS ABS: 1.2 10*3/uL (ref 0.7–4.0)
Lymphocytes Relative: 18 %
MCH: 31.2 pg (ref 26.0–34.0)
MCHC: 34 g/dL (ref 30.0–36.0)
MCV: 91.7 fL (ref 78.0–100.0)
MONO ABS: 0.6 10*3/uL (ref 0.1–1.0)
Monocytes Relative: 9 %
Neutro Abs: 4.7 10*3/uL (ref 1.7–7.7)
Neutrophils Relative %: 70 %
PLATELETS: 258 10*3/uL (ref 150–400)
RBC: 3.27 MIL/uL — AB (ref 4.22–5.81)
RDW: 12.4 % (ref 11.5–15.5)
WBC: 6.6 10*3/uL (ref 4.0–10.5)

## 2015-10-18 LAB — HEPATIC FUNCTION PANEL
ALBUMIN: 4.1 g/dL (ref 3.5–5.0)
ALK PHOS: 100 U/L (ref 38–126)
ALT: 40 U/L (ref 17–63)
AST: 32 U/L (ref 15–41)
Bilirubin, Direct: <0.1 mg/dL — ABNORMAL LOW (ref 0.1–0.5)
TOTAL PROTEIN: 7.2 g/dL (ref 6.5–8.1)
Total Bilirubin: 0.4 mg/dL (ref 0.3–1.2)

## 2015-10-18 LAB — BASIC METABOLIC PANEL
Anion gap: 13 (ref 5–15)
BUN: 44 mg/dL — ABNORMAL HIGH (ref 6–20)
CO2: 21 mmol/L — ABNORMAL LOW (ref 22–32)
Calcium: 9.7 mg/dL (ref 8.9–10.3)
Chloride: 99 mmol/L — ABNORMAL LOW (ref 101–111)
Creatinine, Ser: 1.62 mg/dL — ABNORMAL HIGH (ref 0.61–1.24)
GFR calc Af Amer: 47 mL/min — ABNORMAL LOW (ref >60)
GFR calc non Af Amer: 41 mL/min — ABNORMAL LOW (ref >60)
Glucose, Bld: 519 mg/dL — ABNORMAL HIGH (ref 65–99)
Potassium: 4.8 mmol/L (ref 3.5–5.1)
Sodium: 133 mmol/L — ABNORMAL LOW (ref 135–145)

## 2015-10-18 LAB — BLOOD GAS, VENOUS
Acid-base deficit: 4.1 mmol/L — ABNORMAL HIGH (ref 0.0–2.0)
Bicarbonate: 21 mEq/L (ref 20.0–24.0)
Drawn by: 336101
FIO2: 0.21
O2 Saturation: 85 %
Patient temperature: 97.5
TCO2: 19.8 mmol/L (ref 0–100)
pCO2, Ven: 39.7 mmHg — ABNORMAL LOW (ref 45.0–50.0)
pH, Ven: 7.339 — ABNORMAL HIGH (ref 7.250–7.300)
pO2, Ven: 51.5 mmHg — ABNORMAL HIGH (ref 30.0–45.0)

## 2015-10-18 LAB — CBG MONITORING, ED
GLUCOSE-CAPILLARY: 464 mg/dL — AB (ref 65–99)
GLUCOSE-CAPILLARY: 202 mg/dL — AB (ref 65–99)
Glucose-Capillary: 423 mg/dL — ABNORMAL HIGH (ref 65–99)
Glucose-Capillary: 326 mg/dL — ABNORMAL HIGH (ref 65–99)

## 2015-10-18 LAB — I-STAT CG4 LACTIC ACID, ED
LACTIC ACID, VENOUS: 3.64 mmol/L — AB (ref 0.5–2.0)
Lactic Acid, Venous: 2.28 mmol/L (ref 0.5–2.0)

## 2015-10-18 MED ORDER — PIPERACILLIN-TAZOBACTAM 3.375 G IVPB
3.3750 g | Freq: Once | INTRAVENOUS | Status: AC
  Administered 2015-10-18: 3.375 g via INTRAVENOUS
  Filled 2015-10-18: qty 50

## 2015-10-18 MED ORDER — DEXTROSE-NACL 5-0.45 % IV SOLN
INTRAVENOUS | Status: DC
Start: 2015-10-18 — End: 2015-10-19
  Administered 2015-10-18: 50 mL/h via INTRAVENOUS

## 2015-10-18 MED ORDER — SODIUM CHLORIDE 0.9 % IV SOLN
INTRAVENOUS | Status: DC
  Administered 2015-10-18: 21:00:00 via INTRAVENOUS

## 2015-10-18 MED ORDER — SODIUM CHLORIDE 0.9 % IV BOLUS (SEPSIS)
1000.0000 mL | Freq: Once | INTRAVENOUS | Status: AC
  Administered 2015-10-18: 1000 mL via INTRAVENOUS

## 2015-10-18 MED ORDER — SODIUM CHLORIDE 0.9 % IV SOLN
INTRAVENOUS | Status: DC
  Administered 2015-10-18: 1.4 [IU]/h via INTRAVENOUS
  Administered 2015-10-18: 2.7 [IU]/h via INTRAVENOUS
  Administered 2015-10-18: 2.6 [IU]/h via INTRAVENOUS
  Administered 2015-10-18: 3.6 [IU]/h via INTRAVENOUS
  Administered 2015-10-19: 0.9 [IU]/h via INTRAVENOUS
  Administered 2015-10-19: 1.2 [IU]/h via INTRAVENOUS
  Administered 2015-10-19: 0.9 [IU]/h via INTRAVENOUS
  Filled 2015-10-18: qty 2.5

## 2015-10-18 MED ORDER — INSULIN ASPART 100 UNIT/ML ~~LOC~~ SOLN: SUBCUTANEOUS | Status: DC

## 2015-10-18 MED ORDER — SODIUM CHLORIDE 0.9 % IV SOLN
500.0000 mg | Freq: Once | INTRAVENOUS | Status: AC
  Administered 2015-10-18: 500 mg via INTRAVENOUS
  Filled 2015-10-18: qty 5

## 2015-10-18 NOTE — ED Notes (Signed)
Hospitalist at bedside 

## 2015-10-18 NOTE — ED Notes (Signed)
Bed: WA02 Expected date:  Expected time:  Means of arrival:  Comments: EMS 

## 2015-10-18 NOTE — ED Notes (Signed)
Allen, EDP made aware of patient lactic result. 

## 2015-10-18 NOTE — Clinical Social Work Note (Signed)
Clinical Social Work Assessment  Patient Details  Name: Benjamin Hodge MRN: [id number] Date of Birth: May 14, 1941  Date of referral:  10/18/15               Reason for consult:   (Patient is from [facility].)                Permission sought to share information with:   (None.) Permission granted to share information::  No  Name::        Agency::     Relationship::     Contact Information:     Housing/Transportation Living arrangements for the past 2 months:  [place] (Daughter in law states patient was admitted to [facility]. However, she states priot to being at [facility] the patient was at [employer].) Source of Information:  Adult Children (Daughter in Law/Chris [phone]) Patient Interpreter Needed:  None Criminal Activity/Legal Involvement Pertinent to Current Situation/Hospitalization:  No - Comment as needed Significant Relationships:  Adult Children Lives with:  Facility Resident [facility].) Do you feel safe going back to the place where you live?  Yes Need for family participation in patient care:  Yes (Comment)  Care giving concerns:  There are no care giving concerns at this time. The patient is currently a resident at [facility].   Social Worker assessment / plan:  CSW met with patient at bedside. Daughter in law was present. She confirms patient presents to [facility] due to hyperglycemia. Also, she states patient needs assistance with completing ADL's and medication management; she states while at facility patient receives both. Daughter in law states she and patient's son are a great support to patient.  Daughter in law states patient falls often. Also, she says that patient sometimes appears to have memory issues.  Employment status:  Retired Forensic scientist:   Production designer, theatre/television/film.) PT Recommendations:  Not assessed at this time Information / Referral to community resources:   (No rescources needed  at this time. Daughter in law states upon discharge she would feel safe with patient to return to [facility].)  Patient/Family's Response to care:  Patient and family are aware of admission and have no complaints at this time.  Patient/Family's Understanding of and Emotional Response to Diagnosis, Current Treatment, and Prognosis:  Patient and daughter in law state no questions at this time.  Emotional Assessment Appearance:  Appears stated age Attitude/Demeanor/Rapport:   (Appropriate. Patient was getting blood drawn at bedside.) Affect (typically observed):  Accepting, Appropriate Orientation:  Oriented to Self, Oriented to Place, Oriented to  Time, Oriented to Situation (Daughter in law states that patient sometimes has issues with memory loss. However, she states that he has not been dx with anything.) Alcohol / Substance use:  Not Applicable Psych involvement (Current and /or in the community):  No (Comment)  Discharge Needs  Concerns to be addressed:  Adjustment to Illness Readmission within the last 30 days:  Yes Current discharge risk:  None Barriers to Discharge:  No Barriers Identified   Bernita Buffy, LCSW 10/18/2015, 9:22 PM

## 2015-10-18 NOTE — Progress Notes (Signed)
Pharmacy Antibiotic Note  Benjamin Hodge is a 75 y.o. male admitted on 10/18/2015 with Aspiration PNA.  Pharmacy has been consulted for Zosyn dosing.  Plan: Zosyn 3.375g IV q8h (4 hour infusion).  Height: 5\' 6"  (167.6 cm) Weight: 125 lb (56.7 kg) IBW/kg (Calculated) : 63.8  Temp (24hrs), Avg:97.5 F (36.4 C), Min:97.5 F (36.4 C), Max:97.5 F (36.4 C)   Recent Labs Lab 10/18/15 1940 10/18/15 1955 10/18/15 2121  WBC 6.6  --   --   CREATININE 1.62*  --   --   LATICACIDVEN  --  3.64* 2.28*    Estimated Creatinine Clearance: 33.1 mL/min (by C-G formula based on Cr of 1.62).    Allergies  Allergen Reactions  . Ace Inhibitors Other (See Comments)    dizzy  . Cymbalta [Duloxetine Hcl] Other (See Comments)    dizzy  . Lipitor [Atorvastatin] Other (See Comments)    Caused pain all over body.  . Bee Venom Swelling  . Glucerna [Nutritional Supplements] Diarrhea  . Angiotensin Receptor Blockers Other (See Comments)    unknown    Antimicrobials this admission: 2/10 zosyn >>    Dose adjustments this admission:   Microbiology results:  BCx:   UCx:   Sputum:  MRSA PCR:  Thank you for allowing pharmacy to be a part of this patient's care.  Lorenza Evangelist 10/18/2015 10:48 PM

## 2015-10-18 NOTE — ED Notes (Signed)
Per EMS, pt. From home with complaint of hyperglycemia, 496mg /dl., checked by EMS, denies pain, nor SOB, alert and oriented x3. Per family pt. Has been running high sugar for the whole day today, at 630 this evening pt. Was noted to be weak to even take a step on stairs, sugar was "hi" .

## 2015-10-18 NOTE — ED Provider Notes (Signed)
CSN: [id number]     Arrival date & time 10/18/15  1905 History   First MD Initiated Contact with Patient 10/18/15 1916     Chief Complaint  Patient presents with  . Hyperglycemia     (Consider location/radiation/quality/duration/timing/severity/associated sxs/prior Treatment) HPI Comments: Patient here complaining of hyperglycemia. He was with his son and also had a seizure prior to arrival As a generalized tonic-clonic with some urinary incontinence. He does have a history of seizure disorder and takes Her. Patient is a brittle diabetic and has not had his usual doses of insulin. He denies any chest or abdominal pain. No headache. He was initially postictal but has improved. Denies any recent fever or chills. EMS was called and patient transported here  Patient is a 75 y.o. male presenting with hyperglycemia. The history is provided by the patient and a relative.  Hyperglycemia   Past Medical History  Diagnosis Date  . CAD (coronary artery disease)     LAD 40-50% stenosis, first diagonal small 90% stenosis, circumflex 70% stenosis, OM 80% stenosis, right coronary artery 80-90% stenosis.  . Right upper lobe pneumonia 11/28/2008  . Anxiety 07/18/2008  . Irritable bowel syndrome 04/27/2008  . COPD (chronic obstructive pulmonary disease) (HCC) 02/27/2008  . B12 deficiency 02/16/2008    in 5/09: B12 262, MMA 1040  . Chronic diarrhea 01/01/2008    s/p EGD 10/06: H pylori + gastritis, duodenal biopsy normal (Dr. Elnoria Howard); s/p colonoscopy 10/06: hemorrhoids (Dr. Elnoria Howard); evaluated by Dr. Yancey Flemings  in 8/09: tissue transglutaminase AB negative, VIP normal, stool fat content normal, urine 5-HIAA normal; normal GES 11/09 (done for "fluctuating sugars")  . 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 Orthostatic hypotension 02/09/2007  . Hypertension 02/09/2007  . Spinal stenosis in cervical region 05/03    MRI  . Erectile dysfunction 09/27/2006    s/p penile implant  .  Anemia, iron deficiency 09/27/2006    neg colonoscopy 2006 - Dr. Elnoria Howard; ferritin 152; hgb  15.7  on 07/07  . Hypothyroidism 09/27/2006    TSH 2.639  07/07  . Hypersomnia 09/27/2006    evaluated by Dr. Jetty Duhamel (5/08) and Dr. Vickey Huger; PSG 8/09: chronic delayed sleep phase syndrome (patient sleeps during the day and is awake at night), nocturnal myoclonus (eval for RLS and IDA suggested). Pt advised to change sleeping behavior  . Diabetes mellitus type II 09/07/1984    poorly controlled, complicated by peripheral neuropathy, microalbuminuria, mild non proliferative retinopathy, s/p DKA 7/05, on insulin pump x 4/09, started a pump vacation on 11/19/2008  . Hyperlipidemia   . 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 Hemorrhoids   . Chronic kidney disease   . Unspecified hereditary and idiopathic peripheral neuropathy   . Other chronic pain   . Other malaise and fatigue   . Depression   . Hypertrophy of prostate without urinary obstruction and other lower urinary tract symptoms (LUTS)   . Lumbago   . Heart attack (HCC) 03/2014    mild  . Renal mass, right: Incentendal finding MRI 10/02/2015 10/03/2015  . Hypogonadism in male 10/03/2015   Past Surgical History  Procedure Laterality Date  . Penile prosthesis implant    . Tonsillectomy    . Colonoscopy  06/25/2005    Dr. Jeani Hawking  . Left heart catheterization with coronary angiogram [name] 03/13/2014    Procedure: LEFT HEART CATHETERIZATION WITH CORONARY ANGIOGRAM;  Surgeon: Peter M Swaziland, MD;  Location: [facility] CATH LAB;  Service: Cardiovascular;  Laterality: [name];  . Peripheral vascular  catheterization [name] 02/19/2015    Procedure: Abdominal Aortogram;  Surgeon: Nada Libman, MD;  Location: [facility] INVASIVE CV LAB;  Service: Cardiovascular;  Laterality: [name];  . Peripheral vascular catheterization Left 02/19/2015    Procedure: Lower Extremity Angiography;  Surgeon: Nada Libman, MD;  Location: [facility] INVASIVE CV LAB;  Service: Cardiovascular;  Laterality: Left;    Family History  Problem Relation Age of Onset  . Bone cancer Mother   . Breast cancer Mother   . Cancer Mother   . Lung cancer Father   . Cancer Father   . Breast cancer Sister   . Cancer Sister   . Lung cancer Brother   . Cancer Sister     type unknown   Social History  Substance Use Topics  . Smoking status: Former Smoker    Types: Cigarettes    Quit date: 09/28/2004  . Smokeless tobacco: Never Used  . Alcohol Use: 0.0 oz/week    0 Standard drinks or equivalent per week     Comment: occ/social (moderate)    Review of Systems  All other systems reviewed and are negative.     Allergies  Ace inhibitors; Cymbalta; Lipitor; Bee venom; Glucerna; and Angiotensin receptor blockers  Home Medications   Prior to Admission medications   Medication Sig Start Date End Date Taking? Authorizing Provider  aspirin 81 MG tablet Take 81 mg by mouth at bedtime.     Historical Provider, MD  budesonide-formoterol (SYMBICORT) 160-4.5 MCG/ACT inhaler Inhale 2 puffs into the lungs 2 (two) times daily. 10/03/15   Rodolph Bong, MD  Buprenorphine Lavera Guise) 7.5 MCG/HR PTWK Apply fresh patch on skin every 7 days and remove old patch 10/16/15   Kimber Relic, MD  clopidogrel (PLAVIX) 75 MG tablet TAKE 1 TABLET BY MOUTH DAILY WITH BREAKFAST 06/11/15   Kimber Relic, MD  diphenoxylate-atropine (LOMOTIL) 2.5-0.025 MG tablet One up to 4 times daily if needed for diarrhea 10/16/15   Kimber Relic, MD  escitalopram (LEXAPRO) 20 MG tablet TAKE ONE TABLET BY MOUTH ONE TIME DAILY 08/07/15   Kimber Relic, MD  glucose blood (ONE TOUCH TEST STRIPS) test strip 1 each by Other route 4 (four) times daily. Use as instructed    Historical Provider, MD  insulin aspart (NOVOLOG) 100 UNIT/ML injection Inject 5 units subcutaneously if capillary glucose is greater or equal to 250 mg percent 10/18/15   Kimber Relic, MD  Insulin Glargine (TOUJEO SOLOSTAR) 300 UNIT/ML SOPN Inject 14 Units into the skin at bedtime.      Historical Provider, MD  levETIRAcetam (KEPPRA) 750 MG tablet Take 750 mg by mouth 2 (two) times daily. For seizures    Historical Provider, MD  levothyroxine (SYNTHROID, LEVOTHROID) 75 MCG tablet Take 1 tablet (75 mcg total) by mouth daily. 05/10/15   Kimber Relic, MD  methocarbamol (ROBAXIN) 500 MG tablet Take 500 mg by mouth every 4 (four) hours.     Historical Provider, MD  metoprolol tartrate (LOPRESSOR) 25 MG tablet Take One tablet by mouth twice daily to control BP 09/23/15   Kimber Relic, MD  Multiple Vitamin (MULTIVITAMIN WITH MINERALS) TABS tablet Take 1 tablet by mouth daily. 02/22/15   Osvaldo Shipper, MD  pantoprazole (PROTONIX) 40 MG tablet TAKE ONE TABLET BY MOUTH ONE TIME DAILY 05/14/15   Kimber Relic, MD  pravastatin (PRAVACHOL) 20 MG tablet Take 1 tablet (20 mg total) by mouth every evening. NEEDS APPOINTMENT FOR FUTURE REFILLS OR 90-DAY SUPPLY 09/07/15  Rollene Rotunda, MD  pregabalin (LYRICA) 50 MG capsule Take one capsule by mouth in the morning and two capsules by mouth in the evening for pains. 08/14/15   Kimber Relic, MD  saccharomyces boulardii (FLORASTOR) 250 MG capsule Take 1 capsule (250 mg total) by mouth 2 (two) times daily. Patient taking differently: Take 250-500 mg by mouth every evening.  02/22/15   Osvaldo Shipper, MD  tiotropium (SPIRIVA) 18 MCG inhalation capsule Place 1 capsule (18 mcg total) into inhaler and inhale daily. 10/03/15   Rodolph Bong, MD  vitamin B-12 (CYANOCOBALAMIN) 1000 MCG tablet Take 1,000 mcg by mouth daily.    Historical Provider, MD  zoster vaccine live, PF, (ZOSTAVAX) 94503 UNT/0.65ML injection Inject 19,400 Units into the skin once. 10/16/15   Kimber Relic, MD   BP 76/45 mmHg  Pulse 88  Temp(Src) 97.5 F (36.4 C) (Oral)  Resp 14  SpO2 96% Physical Exam  Constitutional: He is oriented to person, place, and time. He appears cachectic.  Non-toxic appearance. He has a sickly appearance. He appears ill. No distress.  HENT:  Head:  Normocephalic and atraumatic.  Eyes: Conjunctivae, EOM and lids are normal. Pupils are equal, round, and reactive to light.  Neck: Normal range of motion. Neck supple. No tracheal deviation present. No thyroid mass present.  Cardiovascular: Normal rate, regular rhythm and normal heart sounds.  Exam reveals no gallop.   No murmur heard. Pulmonary/Chest: Effort normal and breath sounds normal. No stridor. No respiratory distress. He has no decreased breath sounds. He has no wheezes. He has no rhonchi. He has no rales.  Abdominal: Soft. Normal appearance and bowel sounds are normal. He exhibits no distension. There is no tenderness. There is no rebound and no CVA tenderness.  Musculoskeletal: Normal range of motion. He exhibits no edema or tenderness.  Neurological: He is alert and oriented to person, place, and time. He has normal strength. No cranial nerve deficit or sensory deficit. GCS eye subscore is 4. GCS verbal subscore is 5. GCS motor subscore is 6.  Skin: Skin is warm and dry. No abrasion and no rash noted.  Psychiatric: He has a normal mood and affect. His speech is normal and behavior is normal.  Nursing note and vitals reviewed.   ED Course  Procedures (including critical care time) Labs Review Labs Reviewed  CBG MONITORING, ED - Abnormal; Notable for the following:    Glucose-Capillary 464 (*)    All other components within normal limits  BLOOD GAS, VENOUS  CBC WITH DIFFERENTIAL/PLATELET  URINALYSIS, ROUTINE W REFLEX MICROSCOPIC (NOT AT [facility])  BASIC METABOLIC PANEL  I-STAT CG4 LACTIC ACID, ED    Imaging Review No results found. I have personally reviewed and evaluated these images and lab results as part of my medical decision-making.   EKG Interpretation   Date/Time:  Friday October 18 2015 19:57:17 EST Ventricular Rate:  52 PR Interval:  152 QRS Duration: 114 QT Interval:  458 QTC Calculation: 426 R Axis:   80 Text Interpretation:  Sinus rhythm Borderline  intraventricular conduction  delay Nonspecific T abnormalities, lateral leads No significant change  since last tracing Confirmed by Kendan Cornforth  MD, Rimas Gilham ([id number]) on 10/18/2015  8:46:43 PM      MDM   Final diagnoses:  None    Patient given IV fluid bolus with saline as well as started on insulin drip. His mentation has improved. His elevated lactate was noted. He is afebrile here and Aleve that his current lactic acidosis  is likely from his dehydration and acidosis creatinine has gone from 1 to 1.6. He'll be admitted to the stepdown unit   CRITICAL CARE Performed by: Toy Baker Total critical care time: 60 minutes Critical care time was exclusive of separately billable procedures and treating other patients. Critical care was necessary to treat or prevent imminent or life-threatening deterioration. Critical care was time spent personally by me on the following activities: development of treatment plan with patient and/or surrogate as well as nursing, discussions with consultants, evaluation of patient's response to treatment, examination of patient, obtaining history from patient or surrogate, ordering and performing treatments and interventions, ordering and review of laboratory studies, ordering and review of radiographic studies, pulse oximetry and re-evaluation of patient's condition.     Lorre Nick, MD 10/18/15 2050

## 2015-10-18 NOTE — ED Notes (Signed)
Freida Busman, EDP made aware of patient lactic results.
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
## 2015-10-19 ENCOUNTER — Inpatient Hospital Stay (HOSPITAL_COMMUNITY): Payer: Medicare Other

## 2015-10-19 DIAGNOSIS — E1065 Type 1 diabetes mellitus with hyperglycemia: Principal | ICD-10-CM

## 2015-10-19 DIAGNOSIS — F068 Other specified mental disorders due to known physiological condition: Secondary | ICD-10-CM

## 2015-10-19 DIAGNOSIS — E1042 Type 1 diabetes mellitus with diabetic polyneuropathy: Secondary | ICD-10-CM

## 2015-10-19 DIAGNOSIS — N182 Chronic kidney disease, stage 2 (mild): Secondary | ICD-10-CM

## 2015-10-19 DIAGNOSIS — G40209 Localization-related (focal) (partial) symptomatic epilepsy and epileptic syndromes with complex partial seizures, not intractable, without status epilepticus: Secondary | ICD-10-CM

## 2015-10-19 LAB — URINALYSIS, ROUTINE W REFLEX MICROSCOPIC
BILIRUBIN URINE: NEGATIVE
Hgb urine dipstick: NEGATIVE
KETONES UR: NEGATIVE mg/dL
LEUKOCYTES UA: NEGATIVE
Nitrite: NEGATIVE
PROTEIN: NEGATIVE mg/dL
Specific Gravity, Urine: 1.018 (ref 1.005–1.030)
pH: 5 (ref 5.0–8.0)

## 2015-10-19 LAB — COMPREHENSIVE METABOLIC PANEL
ALBUMIN: 3 g/dL — AB (ref 3.5–5.0)
ALK PHOS: 75 U/L (ref 38–126)
ALT: 31 U/L (ref 17–63)
ANION GAP: 9 (ref 5–15)
AST: 22 U/L (ref 15–41)
BUN: 33 mg/dL — ABNORMAL HIGH (ref 6–20)
CALCIUM: 8.9 mg/dL (ref 8.9–10.3)
CHLORIDE: 108 mmol/L (ref 101–111)
CO2: 23 mmol/L (ref 22–32)
CREATININE: 1.22 mg/dL (ref 0.61–1.24)
GFR calc Af Amer: >60 mL/min (ref >60)
GFR calc non Af Amer: 57 mL/min — ABNORMAL LOW (ref >60)
GLUCOSE: 168 mg/dL — AB (ref 65–99)
Potassium: 3.8 mmol/L (ref 3.5–5.1)
SODIUM: 140 mmol/L (ref 135–145)
Total Bilirubin: 0.4 mg/dL (ref 0.3–1.2)
Total Protein: 5.7 g/dL — ABNORMAL LOW (ref 6.5–8.1)

## 2015-10-19 LAB — PHOSPHORUS: Phosphorus: 3.8 mg/dL (ref 2.5–4.6)

## 2015-10-19 LAB — CBC
HCT: 26.2 % — ABNORMAL LOW (ref 39.0–52.0)
HEMOGLOBIN: 8.7 g/dL — AB (ref 13.0–17.0)
MCH: 30.4 pg (ref 26.0–34.0)
MCHC: 33.2 g/dL (ref 30.0–36.0)
MCV: 91.6 fL (ref 78.0–100.0)
Platelets: 214 10*3/uL (ref 150–400)
RBC: 2.86 MIL/uL — ABNORMAL LOW (ref 4.22–5.81)
RDW: 12.6 % (ref 11.5–15.5)
WBC: 5.6 10*3/uL (ref 4.0–10.5)

## 2015-10-19 LAB — BASIC METABOLIC PANEL
ANION GAP: 9 (ref 5–15)
BUN: 30 mg/dL — ABNORMAL HIGH (ref 6–20)
CALCIUM: 8.9 mg/dL (ref 8.9–10.3)
CO2: 19 mmol/L — AB (ref 22–32)
Chloride: 108 mmol/L (ref 101–111)
Creatinine, Ser: 1.22 mg/dL (ref 0.61–1.24)
GFR calc Af Amer: >60 mL/min (ref >60)
GFR, EST NON AFRICAN AMERICAN: 57 mL/min — AB (ref >60)
Glucose, Bld: 147 mg/dL — ABNORMAL HIGH (ref 65–99)
POTASSIUM: 4.5 mmol/L (ref 3.5–5.1)
Sodium: 136 mmol/L (ref 135–145)

## 2015-10-19 LAB — URINE MICROSCOPIC-ADD ON
Bacteria, UA: NONE SEEN
RBC / HPF: NONE SEEN RBC/hpf (ref 0–5)
SQUAMOUS EPITHELIAL / LPF: NONE SEEN
WBC UA: NONE SEEN WBC/hpf (ref 0–5)

## 2015-10-19 LAB — CBG MONITORING, ED
Glucose-Capillary: 147 mg/dL — ABNORMAL HIGH (ref 65–99)
Glucose-Capillary: 148 mg/dL — ABNORMAL HIGH (ref 65–99)
Glucose-Capillary: 180 mg/dL — ABNORMAL HIGH (ref 65–99)

## 2015-10-19 LAB — GLUCOSE, CAPILLARY
GLUCOSE-CAPILLARY: 151 mg/dL — AB (ref 65–99)
GLUCOSE-CAPILLARY: 299 mg/dL — AB (ref 65–99)

## 2015-10-19 LAB — I-STAT CG4 LACTIC ACID, ED: LACTIC ACID, VENOUS: 1.24 mmol/L (ref 0.5–2.0)

## 2015-10-19 LAB — MRSA PCR SCREENING: MRSA BY PCR: NEGATIVE

## 2015-10-19 LAB — TROPONIN I
Troponin I: <0.03 ng/mL (ref <0.031)
Troponin I: <0.03 ng/mL (ref <0.031)

## 2015-10-19 LAB — STREP PNEUMONIAE URINARY ANTIGEN: Strep Pneumo Urinary Antigen: NEGATIVE

## 2015-10-19 LAB — MAGNESIUM: Magnesium: 1.9 mg/dL (ref 1.7–2.4)

## 2015-10-19 LAB — TSH: TSH: 1.783 u[IU]/mL (ref 0.350–4.500)

## 2015-10-19 LAB — PROCALCITONIN: Procalcitonin: <0.1 ng/mL

## 2015-10-19 MED ORDER — DEXTROSE 50 % IV SOLN: 25.0000 mL | INTRAVENOUS | Status: DC | PRN

## 2015-10-19 MED ORDER — PREGABALIN 50 MG PO CAPS
50.0000 mg | ORAL_CAPSULE | Freq: Every day | ORAL | Status: DC
  Administered 2015-10-19 – 2015-10-21 (×3): 50 mg via ORAL
  Filled 2015-10-19 (×3): qty 1

## 2015-10-19 MED ORDER — ACETAMINOPHEN 650 MG RE SUPP: 650.0000 mg | Freq: Four times a day (QID) | RECTAL | Status: DC | PRN

## 2015-10-19 MED ORDER — METOPROLOL TARTRATE 12.5 MG HALF TABLET
12.5000 mg | ORAL_TABLET | Freq: Two times a day (BID) | ORAL | Status: DC
  Administered 2015-10-19 – 2015-10-21 (×4): 12.5 mg via ORAL
  Filled 2015-10-19 (×5): qty 1

## 2015-10-19 MED ORDER — PIPERACILLIN-TAZOBACTAM 3.375 G IVPB
3.3750 g | Freq: Three times a day (TID) | INTRAVENOUS | Status: DC
  Administered 2015-10-19: 3.375 g via INTRAVENOUS
  Filled 2015-10-19 (×2): qty 50

## 2015-10-19 MED ORDER — ENOXAPARIN SODIUM 40 MG/0.4ML ~~LOC~~ SOLN
40.0000 mg | SUBCUTANEOUS | Status: DC
  Administered 2015-10-20 – 2015-10-21 (×2): 40 mg via SUBCUTANEOUS
  Filled 2015-10-19 (×2): qty 0.4

## 2015-10-19 MED ORDER — BUDESONIDE-FORMOTEROL FUMARATE 160-4.5 MCG/ACT IN AERO
2.0000 | INHALATION_SPRAY | Freq: Two times a day (BID) | RESPIRATORY_TRACT | Status: DC
  Administered 2015-10-20 – 2015-10-21 (×2): 2 via RESPIRATORY_TRACT
  Filled 2015-10-19 (×3): qty 6

## 2015-10-19 MED ORDER — LORAZEPAM 2 MG/ML IJ SOLN
1.0000 mg | INTRAMUSCULAR | Status: DC | PRN
Start: 2015-10-19 — End: 2015-10-21

## 2015-10-19 MED ORDER — INSULIN REGULAR BOLUS VIA INFUSION
0.0000 [IU] | Freq: Three times a day (TID) | INTRAVENOUS | Status: DC
  Administered 2015-10-19: 7.8 [IU] via INTRAVENOUS
  Filled 2015-10-19: qty 10

## 2015-10-19 MED ORDER — HYDROCODONE-ACETAMINOPHEN 5-325 MG PO TABS
1.0000 | ORAL_TABLET | ORAL | Status: DC | PRN
  Administered 2015-10-19: 1 via ORAL
  Filled 2015-10-19: qty 1

## 2015-10-19 MED ORDER — SODIUM CHLORIDE 0.9 % IV SOLN
INTRAVENOUS | Status: DC
  Administered 2015-10-19 – 2015-10-20 (×3): via INTRAVENOUS

## 2015-10-19 MED ORDER — INSULIN ASPART 100 UNIT/ML ~~LOC~~ SOLN: 0.0000 [IU] | Freq: Three times a day (TID) | SUBCUTANEOUS | Status: DC

## 2015-10-19 MED ORDER — SACCHAROMYCES BOULARDII 250 MG PO CAPS
250.0000 mg | ORAL_CAPSULE | Freq: Every evening | ORAL | Status: DC
  Administered 2015-10-19 – 2015-10-21 (×3): 250 mg via ORAL
  Filled 2015-10-19 (×3): qty 1

## 2015-10-19 MED ORDER — PREGABALIN 50 MG PO CAPS: 50.0000 mg | ORAL_CAPSULE | Freq: Two times a day (BID) | ORAL | Status: DC

## 2015-10-19 MED ORDER — ONDANSETRON HCL 4 MG/2ML IJ SOLN: 4.0000 mg | Freq: Four times a day (QID) | INTRAMUSCULAR | Status: DC | PRN

## 2015-10-19 MED ORDER — PRAVASTATIN SODIUM 20 MG PO TABS
20.0000 mg | ORAL_TABLET | Freq: Every evening | ORAL | Status: DC
  Administered 2015-10-19 – 2015-10-21 (×3): 20 mg via ORAL
  Filled 2015-10-19 (×3): qty 1

## 2015-10-19 MED ORDER — INSULIN REGULAR HUMAN 100 UNIT/ML IJ SOLN
INTRAMUSCULAR | Status: DC
  Administered 2015-10-19: 1.2 [IU]/h via INTRAVENOUS
  Filled 2015-10-19: qty 2.5

## 2015-10-19 MED ORDER — LEVOTHYROXINE SODIUM 75 MCG PO TABS
75.0000 ug | ORAL_TABLET | Freq: Every day | ORAL | Status: DC
  Administered 2015-10-19 – 2015-10-21 (×3): 75 ug via ORAL
  Filled 2015-10-19 (×3): qty 1

## 2015-10-19 MED ORDER — VANCOMYCIN HCL IN DEXTROSE 750-5 MG/150ML-% IV SOLN
750.0000 mg | INTRAVENOUS | Status: DC
  Administered 2015-10-19 – 2015-10-20 (×2): 750 mg via INTRAVENOUS
  Filled 2015-10-19 (×3): qty 150

## 2015-10-19 MED ORDER — GUAIFENESIN ER 600 MG PO TB12
600.0000 mg | ORAL_TABLET | Freq: Two times a day (BID) | ORAL | Status: DC
  Administered 2015-10-19 – 2015-10-21 (×6): 600 mg via ORAL
  Filled 2015-10-19 (×6): qty 1

## 2015-10-19 MED ORDER — METHOCARBAMOL 500 MG PO TABS
500.0000 mg | ORAL_TABLET | ORAL | Status: DC
  Administered 2015-10-19 – 2015-10-21 (×15): 500 mg via ORAL
  Filled 2015-10-19 (×15): qty 1

## 2015-10-19 MED ORDER — ASPIRIN 81 MG PO CHEW
81.0000 mg | CHEWABLE_TABLET | Freq: Every day | ORAL | Status: DC
  Administered 2015-10-19 – 2015-10-20 (×3): 81 mg via ORAL
  Filled 2015-10-19 (×3): qty 1

## 2015-10-19 MED ORDER — SODIUM CHLORIDE 0.9% FLUSH
3.0000 mL | Freq: Two times a day (BID) | INTRAVENOUS | Status: DC
  Administered 2015-10-19 – 2015-10-21 (×4): 3 mL via INTRAVENOUS

## 2015-10-19 MED ORDER — DEXTROSE-NACL 5-0.45 % IV SOLN
INTRAVENOUS | Status: DC
  Administered 2015-10-19: 03:00:00 via INTRAVENOUS

## 2015-10-19 MED ORDER — LEVETIRACETAM 500 MG PO TABS
750.0000 mg | ORAL_TABLET | Freq: Two times a day (BID) | ORAL | Status: DC
  Administered 2015-10-19 – 2015-10-21 (×5): 750 mg via ORAL
  Filled 2015-10-19 (×6): qty 1

## 2015-10-19 MED ORDER — INSULIN ASPART 100 UNIT/ML ~~LOC~~ SOLN
0.0000 [IU] | Freq: Three times a day (TID) | SUBCUTANEOUS | Status: DC
  Administered 2015-10-19: 4 [IU] via SUBCUTANEOUS
  Administered 2015-10-19: 3 [IU] via SUBCUTANEOUS
  Administered 2015-10-20: 4 [IU] via SUBCUTANEOUS
  Administered 2015-10-20 – 2015-10-21 (×2): 20 [IU] via SUBCUTANEOUS
  Administered 2015-10-21: 7 [IU] via SUBCUTANEOUS

## 2015-10-19 MED ORDER — ESCITALOPRAM OXALATE 20 MG PO TABS
20.0000 mg | ORAL_TABLET | Freq: Every day | ORAL | Status: DC
  Administered 2015-10-19 – 2015-10-21 (×3): 20 mg via ORAL
  Filled 2015-10-19 (×3): qty 1

## 2015-10-19 MED ORDER — PREGABALIN 50 MG PO CAPS
100.0000 mg | ORAL_CAPSULE | Freq: Every day | ORAL | Status: DC
  Administered 2015-10-19 – 2015-10-20 (×2): 100 mg via ORAL
  Filled 2015-10-19 (×2): qty 2

## 2015-10-19 MED ORDER — ALBUTEROL SULFATE (2.5 MG/3ML) 0.083% IN NEBU: 2.5000 mg | INHALATION_SOLUTION | RESPIRATORY_TRACT | Status: DC | PRN

## 2015-10-19 MED ORDER — PANTOPRAZOLE SODIUM 40 MG PO TBEC
40.0000 mg | DELAYED_RELEASE_TABLET | Freq: Every day | ORAL | Status: DC
  Administered 2015-10-19 – 2015-10-21 (×3): 40 mg via ORAL
  Filled 2015-10-19 (×3): qty 1

## 2015-10-19 MED ORDER — PIPERACILLIN-TAZOBACTAM 3.375 G IVPB
3.3750 g | Freq: Three times a day (TID) | INTRAVENOUS | Status: DC
Start: 2015-10-19 — End: 2015-10-20
  Administered 2015-10-19 – 2015-10-20 (×2): 3.375 g via INTRAVENOUS
  Filled 2015-10-19 (×6): qty 50

## 2015-10-19 MED ORDER — CLOPIDOGREL BISULFATE 75 MG PO TABS
75.0000 mg | ORAL_TABLET | Freq: Every day | ORAL | Status: DC
  Administered 2015-10-19 – 2015-10-21 (×3): 75 mg via ORAL
  Filled 2015-10-19 (×3): qty 1

## 2015-10-19 MED ORDER — INSULIN GLARGINE 100 UNIT/ML ~~LOC~~ SOLN
10.0000 [IU] | Freq: Every day | SUBCUTANEOUS | Status: DC
  Administered 2015-10-19: 10 [IU] via SUBCUTANEOUS
  Filled 2015-10-19 (×2): qty 0.1

## 2015-10-19 MED ORDER — ENOXAPARIN SODIUM 30 MG/0.3ML ~~LOC~~ SOLN
30.0000 mg | SUBCUTANEOUS | Status: DC
Start: 2015-10-19 — End: 2015-10-19
  Administered 2015-10-19: 30 mg via SUBCUTANEOUS
  Filled 2015-10-19 (×2): qty 0.3

## 2015-10-19 MED ORDER — IPRATROPIUM BROMIDE 0.02 % IN SOLN
0.5000 mg | Freq: Four times a day (QID) | RESPIRATORY_TRACT | Status: DC
  Filled 2015-10-19: qty 2.5

## 2015-10-19 MED ORDER — ONDANSETRON HCL 4 MG PO TABS: 4.0000 mg | ORAL_TABLET | Freq: Four times a day (QID) | ORAL | Status: DC | PRN

## 2015-10-19 MED ORDER — ACETAMINOPHEN 325 MG PO TABS: 650.0000 mg | ORAL_TABLET | Freq: Four times a day (QID) | ORAL | Status: DC | PRN

## 2015-10-19 NOTE — Progress Notes (Addendum)
PROGRESS NOTE  JEN CADOTTE NOI:[id number] DOB: 07-15-1941 DOA: 10/18/2015 PCP: Kimber Relic, MD  HPI/Recap of past 24 hours: Patient is a 75 year old male with past medical history of hypothyroidism, diabetes mellitus on insulin, CAD, COPD and seizure disorder who is had a number of admissions in the past few months for recurrent seizures as well as hyperglycemia. He has been at nursing facilities and there have been concerns as to whether or not he is getting his medication. Patient brought in on 2/10 patient had another seizure as well as significant hyperglycemia after family brought patient home from assisted living facility. In the emergency room, negative sugars as high as 500-6 100s although not in DKA, hypotension. Other labs and vitals stable. Patient initially started on insulin drip plus IV Keppra. Admitted to hospitalist service and placed in stepdown unit. X-ray overnight noted new finding as compared to a few weeks ago patchy infiltrate and started on broad-spectrum antibiotics  Today, patient awake and alert. Oriented 2. Does not recall any seizure activity or previous history of seizures. He says he is feeling fine. No complaints. EEG done noted diffuse slow wave findings consistent with encephalopathy.  Assessment/Plan: Active Problems:   Type 1 diabetes mellitus with neurological manifestations, uncontrolled (HCC) with hyperglycemia In stage I chronic kidney disease: Now that he is able to take by mouth, have started Lantus plus sliding scale. Monitor CBGs closely.    Essential hypertension: Blood pressure stable, initially hypotensive likely from diuresis from hyperglycemia    COPD (chronic obstructive pulmonary disease) (HCC)Respiratory status stable.    CAD- moderate3V disease at cath 7/15- medical Rx: Stable.   Anemia- chronic    Partial symptomatic epilepsy with complex partial seizures, not intractable, without status epilepticus (HCC): On IV Keppra. EEG  unrevealing. Neurology following.    Dementia arising in the senium and presenium: No acute behavioral disturbance    Aspiration pneumonia (HCC)/healthcare associated pneumonia: On IV antibiotics   Code Status: Full code   Family Communication: Spoke with son by phone.   Disposition Plan: Stabilizing, transfer to telemetry bed    Consultants:  Neurology  Procedures:  EEG done 2/11: Generalized slow wave findings consistent with encephalopathy  Antibiotics:  IV Zosyn and Vanco 2/11-present   Objective: BP 135/59 mmHg  Pulse 55  Temp(Src) 97.5 F (36.4 C) (Oral)  Resp 10  Ht 5\' 6"  (1.676 m)  Wt 58.4 kg (128 lb 12 oz)  BMI 20.79 kg/m2  SpO2 98%  Intake/Output Summary (Last 24 hours) at 10/19/15 1417 Last data filed at 10/19/15 1300  Gross per 24 hour  Intake 4451.97 ml  Output   1150 ml  Net 3301.97 ml   Filed Weights   10/18/15 2131 10/19/15 0323  Weight: 56.7 kg (125 lb) 58.4 kg (128 lb 12 oz)    Exam:   General:  Alert and oriented 2, no acute distress   Cardiovascular: Regular rate and rhythm, S1-S2   Respiratory: Clear to auscultation bilaterally   Abdomen: Soft, nontender, nondistended, hypoactive bowel sounds   Musculoskeletal: No clubbing or cyanosis or edema    Data Reviewed: Basic Metabolic Panel:  Recent Labs Lab 10/18/15 1940 10/19/15 0415 10/19/15 0846  NA 133* 140 136  K 4.8 3.8 4.5  CL 99* 108 108  CO2 21* 23 19*  GLUCOSE 519* 168* 147*  BUN 44* 33* 30*  CREATININE 1.62* 1.22 1.22  CALCIUM 9.7 8.9 8.9  MG  --  1.9  --   PHOS  --  3.8  --    Liver Function Tests:  Recent Labs Lab 10/18/15 1940 10/19/15 0415  AST 32 22  ALT 40 31  ALKPHOS 100 75  BILITOT 0.4 0.4  PROT 7.2 5.7*  ALBUMIN 4.1 3.0*   No results for input(s): LIPASE, AMYLASE in the last 168 hours. No results for input(s): AMMONIA in the last 168 hours. CBC:  Recent Labs Lab 10/18/15 1940 10/19/15 0415  WBC 6.6 5.6  NEUTROABS 4.7  --   HGB  10.2* 8.7*  HCT 30.0* 26.2*  MCV 91.7 91.6  PLT 258 214   Cardiac Enzymes:    Recent Labs Lab 10/19/15 0415 10/19/15 0846  TROPONINI <0.03 <0.03   BNP (last 3 results) No results for input(s): BNP in the last 8760 hours.  ProBNP (last 3 results) No results for input(s): PROBNP in the last 8760 hours.  CBG:  Recent Labs Lab 10/18/15 2128 10/18/15 2246 10/19/15 0002 10/19/15 0126 10/19/15 0236  GLUCAP 326* 202* 148* 147* 180*    Recent Results (from the past 240 hour(s))  Blood Culture (routine x 2)     Status: None (Preliminary result)   Collection Time: 10/18/15  8:54 PM  Result Value Ref Range Status   Specimen Description BLOOD RIGHT HAND  Final   Special Requests IN PEDIATRIC BOTTLE 3CC  Final   Culture PENDING  Incomplete   Report Status PENDING  Incomplete  Blood Culture (routine x 2)     Status: None (Preliminary result)   Collection Time: 10/18/15  8:55 PM  Result Value Ref Range Status   Specimen Description BLOOD RIGHT HAND  Final   Special Requests IN PEDIATRIC BOTTLE 2CC  Final   Culture PENDING  Incomplete   Report Status PENDING  Incomplete  MRSA PCR Screening     Status: None   Collection Time: 10/19/15  5:26 AM  Result Value Ref Range Status   MRSA by PCR NEGATIVE NEGATIVE Final    Comment:        The GeneXpert MRSA Assay (FDA approved for NASAL specimens only), is one component of a comprehensive MRSA colonization surveillance program. It is not intended to diagnose MRSA infection nor to guide or monitor treatment for MRSA infections.      Studies: Ct Head Wo Contrast  10/18/2015  CLINICAL DATA:  Acute onset of hyperglycemia. Generalized weakness. Initial encounter. EXAM: CT HEAD WITHOUT CONTRAST TECHNIQUE: Contiguous axial images were obtained from the base of the skull through the vertex without intravenous contrast. COMPARISON:  CT of the head performed 09/27/2015, and MRI of the brain performed 09/28/2015 FINDINGS: There is no  evidence of acute infarction, mass lesion, or intra- or extra-axial hemorrhage on CT. Prominence of the ventricles and sulci suggest mild cortical volume loss. Mild periventricular and subcortical white matter change likely reflects small vessel ischemic microangiopathy. A chronic lacunar infarct is noted at the left basal ganglia. The brainstem and fourth ventricle are within normal limits. The cerebral hemispheres demonstrate grossly normal gray-white differentiation. No mass effect or midline shift is seen. There is no evidence of fracture; visualized osseous structures are unremarkable in appearance. The orbits are within normal limits. The paranasal sinuses and mastoid air cells are well-aerated. No significant soft tissue abnormalities are seen. IMPRESSION: 1. No acute intracranial pathology seen on CT. 2. Mild cortical volume loss and scattered small vessel ischemic microangiopathy. 3. Chronic lacunar infarct at the left basal ganglia. Electronically Signed   By: Roanna Raider M.D.   On: 10/18/2015 22:28  Dg Chest Port 1 View  10/18/2015  CLINICAL DATA:  75 year old presenting with acute encephalopathy, hyperglycemia and generalized weakness which began earlier today. EXAM: PORTABLE CHEST 1 VIEW COMPARISON:  09/27/2015 and earlier. FINDINGS: Cardiac silhouette mildly to moderately enlarged, unchanged. Chronic elevation of the left hemidiaphragm. Linear scarring at the left lung base, unchanged. Patchy airspace opacities at the right lung base, new since the examination 2-3 weeks ago. No visible pleural effusions. IMPRESSION: 1. Patchy bronchopneumonia involving the right lung base. 2. Stable cardiomegaly without pulmonary edema. Electronically Signed   By: Hulan Saas M.D.   On: 10/18/2015 22:17    Scheduled Meds: . aspirin  81 mg Oral QHS  . budesonide-formoterol  2 puff Inhalation BID  . clopidogrel  75 mg Oral Q breakfast  . [START ON 10/20/2015] enoxaparin (LOVENOX) injection  40 mg  Subcutaneous Q24H  . escitalopram  20 mg Oral Daily  . guaiFENesin  600 mg Oral BID  . insulin aspart  0-20 Units Subcutaneous TID WC  . insulin glargine  10 Units Subcutaneous Daily  . levETIRAcetam  750 mg Oral BID  . levothyroxine  75 mcg Oral QAC breakfast  . methocarbamol  500 mg Oral 6 times per day  . metoprolol tartrate  12.5 mg Oral BID  . pantoprazole  40 mg Oral Daily  . piperacillin-tazobactam (ZOSYN)  IV  3.375 g Intravenous Q8H  . pravastatin  20 mg Oral QPM  . pregabalin  100 mg Oral QHS  . pregabalin  50 mg Oral Daily  . saccharomyces boulardii  250 mg Oral QPM  . sodium chloride flush  3 mL Intravenous Q12H  . vancomycin  750 mg Intravenous Q24H    Continuous Infusions: . sodium chloride Stopped (10/19/15 0323)  . sodium chloride 125 mL/hr at 10/19/15 1218  . dextrose 5 % and 0.45% NaCl Stopped (10/19/15 1120)     Time spent: 25 minutes   Hollice Espy  [facility] Pager [phone] . If 7PM-7AM, please contact night-coverage at [email or web address], password [facility] 10/19/2015, 2:17 PM  LOS: 1 day

## 2015-10-19 NOTE — Progress Notes (Signed)
Admission note:  Arrival Method: Patient transferred from 3S in bed accompanied by the staff. Mental Orientation:  Alert and oriented x 2 to 3 Telemetry: 6E-15, NSR Assessment: See doc flow sheets. Skin: Warm, dry and intact.  Assessed by two nurses Mathis Fare). IV: NS 125 ml/ hr. Pain: Denies any pain currently. Tubes: [name] Safety Measures: Bed in low position, call light and phone within reach. Admission Screening:  Completed. 6700 Orientation: Patient has been oriented to the unit, staff and to the room.

## 2015-10-19 NOTE — Progress Notes (Signed)
EEG Completed; Results Pending  

## 2015-10-19 NOTE — Progress Notes (Signed)
Pharmacy Antibiotic Note  Benjamin Hodge is a 75 y.o. male admitted on 10/18/2015 with aspiration PNA.  Pharmacy consulted earlier at [facility] for Zosyn dosing. Now adding Vancomycin.  Plan: Zosyn 3.375g IV q8h (4 hour infusion).  Vancomycin 750mg  IV q24h Will f/u micro data, renal function, and pt's clinical condition Vanc trough prn   Height: 5\' 6"  (167.6 cm) Weight: 125 lb (56.7 kg) IBW/kg (Calculated) : 63.8  Temp (24hrs), Avg:97.9 F (36.6 C), Min:97.5 F (36.4 C), Max:98.2 F (36.8 C)   Recent Labs Lab 10/18/15 1940 10/18/15 1955 10/18/15 2121 10/19/15 0112  WBC 6.6  --   --   --   CREATININE 1.62*  --   --   --   LATICACIDVEN  --  3.64* 2.28* 1.24    Estimated Creatinine Clearance: 33.1 mL/min (by C-G formula based on Cr of 1.62).    Allergies  Allergen Reactions  . Ace Inhibitors Other (See Comments)    dizzy  . Cymbalta [Duloxetine Hcl] Other (See Comments)    dizzy  . Lipitor [Atorvastatin] Other (See Comments)    Caused pain all over body.  . Bee Venom Swelling  . Glucerna [Nutritional Supplements] Diarrhea  . Angiotensin Receptor Blockers Other (See Comments)    unknown    Antimicrobials this admission: 2/10 zosyn >>  2/10 vanc>>  Dose adjustments this admission: n/a  Microbiology results: 2/10 BCx:   Sputum:  MRSA PCR:  Thank you for allowing pharmacy to be a part of this patient's care.  Christoper Fabian, PharmD, BCPS Clinical pharmacist, pager [phone] 10/19/2015 3:17 AM

## 2015-10-19 NOTE — Consult Note (Signed)
Consult Reason for Consult: seizure Referring Physician: Dr Rito Ehrlich  CC: seizure  HPI: Benjamin Hodge is an 75 y.o. male hx of multiple medical problems including seizures and DM presenting with breakthrough seizures in the setting of hyperglycemia. Patient was discharged from NH to family on Feb 3, they report since then he has had labile blood sugars and breakthrough seizures. Moved to ALF on 12/09 but to inability to manage blood sugars he was brought home. While at home had another breakthrough seizure and was subsequently post-ictal. Family reports being unsure if he is truly taking his seizure medication.   CT head imaging reviewed, no acute process. Initial labs pertinent for CBG of 464, lactic acid 3.64.   Past Medical History  Diagnosis Date  . CAD (coronary artery disease)     LAD 40-50% stenosis, first diagonal small 90% stenosis, circumflex 70% stenosis, OM 80% stenosis, right coronary artery 80-90% stenosis.  . Right upper lobe pneumonia 11/28/2008  . Anxiety 07/18/2008  . Irritable bowel syndrome 04/27/2008  . COPD (chronic obstructive pulmonary disease) (HCC) 02/27/2008  . B12 deficiency 02/16/2008    in 5/09: B12 262, MMA 1040  . Chronic diarrhea 01/01/2008    s/p EGD 10/06: H pylori + gastritis, duodenal biopsy normal (Dr. Elnoria Howard); s/p colonoscopy 10/06: hemorrhoids (Dr. Elnoria Howard); evaluated by Dr. Yancey Flemings  in 8/09: tissue transglutaminase AB negative, VIP normal, stool fat content normal, urine 5-HIAA normal; normal GES 11/09 (done for "fluctuating sugars")  . 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 Orthostatic hypotension 02/09/2007  . Hypertension 02/09/2007  . Spinal stenosis in cervical region 05/03    MRI  . Erectile dysfunction 09/27/2006    s/p penile implant  . Anemia, iron deficiency 09/27/2006    neg colonoscopy 2006 - Dr. Elnoria Howard; ferritin 152; hgb  15.7  on 07/07  . Hypothyroidism 09/27/2006    TSH 2.639  07/07  . Hypersomnia  09/27/2006    evaluated by Dr. Jetty Duhamel (5/08) and Dr. Vickey Huger; PSG 8/09: chronic delayed sleep phase syndrome (patient sleeps during the day and is awake at night), nocturnal myoclonus (eval for RLS and IDA suggested). Pt advised to change sleeping behavior  . Diabetes mellitus type II 09/07/1984    poorly controlled, complicated by peripheral neuropathy, microalbuminuria, mild non proliferative retinopathy, s/p DKA 7/05, on insulin pump x 4/09, started a pump vacation on 11/19/2008  . Hyperlipidemia   . 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 Hemorrhoids   . Chronic kidney disease   . Unspecified hereditary and idiopathic peripheral neuropathy   . Other chronic pain   . Other malaise and fatigue   . Depression   . Hypertrophy of prostate without urinary obstruction and other lower urinary tract symptoms (LUTS)   . Lumbago   . Heart attack (HCC) 03/2014    mild  . Renal mass, right: Incentendal finding MRI 10/02/2015 10/03/2015  . Hypogonadism in male 10/03/2015    Past Surgical History  Procedure Laterality Date  . Penile prosthesis implant    . Tonsillectomy    . Colonoscopy  06/25/2005    Dr. Jeani Hawking  . Left heart catheterization with coronary angiogram [name] 03/13/2014    Procedure: LEFT HEART CATHETERIZATION WITH CORONARY ANGIOGRAM;  Surgeon: Ellanora Rayborn M [place], MD;  Location: [facility] CATH LAB;  Service: Cardiovascular;  Laterality: [name];  . Peripheral vascular catheterization [name] 02/19/2015    Procedure: Abdominal Aortogram;  Surgeon: Nada Libman, MD;  Location: [facility] INVASIVE CV LAB;  Service: Cardiovascular;  Laterality: [name];  .  Peripheral vascular catheterization Left 02/19/2015    Procedure: Lower Extremity Angiography;  Surgeon: Nada Libman, MD;  Location: [facility] INVASIVE CV LAB;  Service: Cardiovascular;  Laterality: Left;    Family History  Problem Relation Age of Onset  . Bone cancer Mother   . Breast cancer Mother   . Cancer Mother   . Lung cancer Father   . Cancer Father   .  Breast cancer Sister   . Cancer Sister   . Lung cancer Brother   . Cancer Sister     type unknown    Social History:  reports that he quit smoking about 11 years ago. His smoking use included Cigarettes. He has never used smokeless tobacco. He reports that he drinks alcohol. He reports that he does not use illicit drugs.  Allergies  Allergen Reactions  . Ace Inhibitors Other (See Comments)    dizzy  . Cymbalta [Duloxetine Hcl] Other (See Comments)    dizzy  . Lipitor [Atorvastatin] Other (See Comments)    Caused pain all over body.  . Bee Venom Swelling  . Glucerna [Nutritional Supplements] Diarrhea  . Angiotensin Receptor Blockers Other (See Comments)    unknown    Medications: I have reviewed the patient's current medications.   ROS: Out of a complete 14 system review, the patient complains of only the following symptoms, and all other reviewed systems are negative. +seizures  Physical Examination: Filed Vitals:   10/19/15 0734 10/19/15 0800  BP: 115/59 119/66  Pulse: 60 52  Temp:  97.7 F (36.5 C)  Resp: 13 13   Physical Exam  Constitutional: He appears well-developed and well-nourished.  Psych: Affect appropriate to situation Eyes: No scleral injection HENT: No OP obstrucion Head: Normocephalic.  Cardiovascular: Normal rate and regular rhythm.  Respiratory: Effort normal and breath sounds normal.  GI: Soft. Bowel sounds are normal. No distension. There is no tenderness.  Skin: WDI  Neurologic Examination Mental Status: Alert, oriented, thought content appropriate.  Speech fluent without evidence of aphasia.  Able to follow 3 step commands without difficulty. Cranial Nerves: II: funduscopic exam wnl bilaterally, visual fields grossly normal, pupils equal, round, reactive to light and accommodation III,IV, VI: ptosis not present, extra-ocular motions intact bilaterally V,VII: smile symmetric, facial light touch sensation normal bilaterally VIII: hearing  normal bilaterally IX,X: gag reflex present XI: trapezius strength/neck flexion strength normal bilaterally XII: tongue strength normal  Motor: Right : Upper extremity    Left:     Upper extremity 5/5 deltoid       5/5 deltoid 5/5 biceps      5/5 biceps  5/5 triceps      5/5 triceps 5/5wrist flexion     5/5 wrist flexion 5/5 wrist extension     5/5 wrist extension 5/5 hand grip      5/5 hand grip  Lower extremity     Lower extremity 5/5 hip flexor      5/5 hip flexor 5/5 hip adductors     5/5 hip adductors 5/5 hip abductors     5/5 hip abductors 5/5 quadricep      5/5 quadriceps  5/5 hamstrings     5/5 hamstrings 5/5 plantar flexion       5/5 plantar flexion 5/5 plantar extension     5/5 plantar extension Tone and bulk:normal tone throughout; no atrophy noted Sensory: Pinprick and light touch intact throughout, bilaterally Deep Tendon Reflexes: 2+ and symmetric throughout Plantars: Right: downgoing   Left: downgoing Cerebellar: normal finger-to-nose,  normal rapid alternating movements and normal heel-to-shin test Gait: normal gait and station  Laboratory Studies:   Basic Metabolic Panel:  Recent Labs Lab 10/18/15 1940 10/19/15 0415 10/19/15 0846  NA 133* 140 136  K 4.8 3.8 4.5  CL 99* 108 108  CO2 21* 23 19*  GLUCOSE 519* 168* 147*  BUN 44* 33* 30*  CREATININE 1.62* 1.22 1.22  CALCIUM 9.7 8.9 8.9  MG  --  1.9  --   PHOS  --  3.8  --     Liver Function Tests:  Recent Labs Lab 10/18/15 1940 10/19/15 0415  AST 32 22  ALT 40 31  ALKPHOS 100 75  BILITOT 0.4 0.4  PROT 7.2 5.7*  ALBUMIN 4.1 3.0*   No results for input(s): LIPASE, AMYLASE in the last 168 hours. No results for input(s): AMMONIA in the last 168 hours.  CBC:  Recent Labs Lab 10/18/15 1940 10/19/15 0415  WBC 6.6 5.6  NEUTROABS 4.7  --   HGB 10.2* 8.7*  HCT 30.0* 26.2*  MCV 91.7 91.6  PLT 258 214    Cardiac Enzymes:  Recent Labs Lab 10/19/15 0415 10/19/15 0846  TROPONINI <0.03  <0.03    BNP: Invalid input(s): POCBNP  CBG:  Recent Labs Lab 10/18/15 2128 10/18/15 2246 10/19/15 0002 10/19/15 0126 10/19/15 0236  GLUCAP 326* 202* 148* 147* 180*    Microbiology: Results for orders placed or performed during the hospital encounter of 10/18/15  Blood Culture (routine x 2)     Status: None (Preliminary result)   Collection Time: 10/18/15  8:54 PM  Result Value Ref Range Status   Specimen Description BLOOD RIGHT HAND  Final   Special Requests IN PEDIATRIC BOTTLE 3CC  Final   Culture PENDING  Incomplete   Report Status PENDING  Incomplete  Blood Culture (routine x 2)     Status: None (Preliminary result)   Collection Time: 10/18/15  8:55 PM  Result Value Ref Range Status   Specimen Description BLOOD RIGHT HAND  Final   Special Requests IN PEDIATRIC BOTTLE 2CC  Final   Culture PENDING  Incomplete   Report Status PENDING  Incomplete  MRSA PCR Screening     Status: None   Collection Time: 10/19/15  5:26 AM  Result Value Ref Range Status   MRSA by PCR NEGATIVE NEGATIVE Final    Comment:        The GeneXpert MRSA Assay (FDA approved for NASAL specimens only), is one component of a comprehensive MRSA colonization surveillance program. It is not intended to diagnose MRSA infection nor to guide or monitor treatment for MRSA infections.     Coagulation Studies: No results for input(s): LABPROT, INR in the last 72 hours.  Urinalysis:  Recent Labs Lab 10/19/15 0132  COLORURINE YELLOW  LABSPEC 1.018  PHURINE 5.0  GLUCOSEU >1000*  HGBUR NEGATIVE  BILIRUBINUR NEGATIVE  KETONESUR NEGATIVE  PROTEINUR NEGATIVE  NITRITE NEGATIVE  LEUKOCYTESUR NEGATIVE    Lipid Panel:     Component Value Date/Time   CHOL 192 01/01/2015 1421   CHOL 105 03/11/2014 0645   TRIG 96 01/01/2015 1421   HDL 84 01/01/2015 1421   HDL 43 03/11/2014 0645   CHOLHDL 2.3 01/01/2015 1421   CHOLHDL 2.4 03/11/2014 0645   VLDL 16 03/11/2014 0645   LDLCALC 89 01/01/2015 1421    LDLCALC 46 03/11/2014 0645    HgbA1C:  Lab Results  Component Value Date   HGBA1C 12.4* 09/14/2015    Urine Drug Screen:  Component Value Date/Time   LABOPIA NONE DETECTED 09/27/2015 2138   LABOPIA * 11/26/2008 0500    POSITIVE (NOTE) Result repeated and verified. Sent for confirmatory testing   COCAINSCRNUR NONE DETECTED 09/27/2015 2138   COCAINSCRNUR NEGATIVE 11/26/2008 0500   LABBENZ NONE DETECTED 09/27/2015 2138   LABBENZ NEGATIVE 11/26/2008 0500   AMPHETMU NONE DETECTED 09/27/2015 2138   AMPHETMU NEGATIVE 11/26/2008 0500   THCU NONE DETECTED 09/27/2015 2138   LABBARB NONE DETECTED 09/27/2015 2138    Alcohol Level: No results for input(s): ETH in the last 168 hours.  Other results:  Imaging: Ct Head Wo Contrast  10/18/2015  CLINICAL DATA:  Acute onset of hyperglycemia. Generalized weakness. Initial encounter. EXAM: CT HEAD WITHOUT CONTRAST TECHNIQUE: Contiguous axial images were obtained from the base of the skull through the vertex without intravenous contrast. COMPARISON:  CT of the head performed 09/27/2015, and MRI of the brain performed 09/28/2015 FINDINGS: There is no evidence of acute infarction, mass lesion, or intra- or extra-axial hemorrhage on CT. Prominence of the ventricles and sulci suggest mild cortical volume loss. Mild periventricular and subcortical white matter change likely reflects small vessel ischemic microangiopathy. A chronic lacunar infarct is noted at the left basal ganglia. The brainstem and fourth ventricle are within normal limits. The cerebral hemispheres demonstrate grossly normal gray-white differentiation. No mass effect or midline shift is seen. There is no evidence of fracture; visualized osseous structures are unremarkable in appearance. The orbits are within normal limits. The paranasal sinuses and mastoid air cells are well-aerated. No significant soft tissue abnormalities are seen. IMPRESSION: 1. No acute intracranial pathology seen on  CT. 2. Mild cortical volume loss and scattered small vessel ischemic microangiopathy. 3. Chronic lacunar infarct at the left basal ganglia. Electronically Signed   By: Roanna Raider M.D.   On: 10/18/2015 22:28   Dg Chest Port 1 View  10/18/2015  CLINICAL DATA:  75 year old presenting with acute encephalopathy, hyperglycemia and generalized weakness which began earlier today. EXAM: PORTABLE CHEST 1 VIEW COMPARISON:  09/27/2015 and earlier. FINDINGS: Cardiac silhouette mildly to moderately enlarged, unchanged. Chronic elevation of the left hemidiaphragm. Linear scarring at the left lung base, unchanged. Patchy airspace opacities at the right lung base, new since the examination 2-3 weeks ago. No visible pleural effusions. IMPRESSION: 1. Patchy bronchopneumonia involving the right lung base. 2. Stable cardiomegaly without pulmonary edema. Electronically Signed   By: Hulan Saas M.D.   On: 10/18/2015 22:17     Assessment/Plan:  74y/o gentleman admitted with breakthrough seizure activity in the setting of hyperglycemia. Home AED regimen consists of keppra 750mg  BID. Per family there is a question of whether or not he is taking the medication. Suspect breakthrough seizures in the setting of possible medication non-compliance and hyperglycemia. Overall appears neurologically stable  -due to question of non-compliance and suspected provoked seizure would hold on any adjustment to keppra at this time, continue home regimen -Patient is unable to drive, operate heavy machinery, perform activities at heights or participate in water activities until release by outpatient physician.  -follow up with outpatient neurologist  Elspeth Cho, DO [facility] [phone]  If 7pm- 7am, please page neurology on call as listed in AMION. 10/19/2015, 9:53 AM

## 2015-10-19 NOTE — Procedures (Signed)
ELECTROENCEPHALOGRAM REPORT   Patient: Benjamin Hodge        Age: 75 y.o.        Sex: male Referring Physician: Dr Rito Ehrlich Report Date:  10/19/2015        Interpreting Physician: Omelia Blackwater  History: [name] is an 75 y.o. male hx of seizures admitted with breakthrough seizure and hyperglycemia  Medications:  I have reviewed the patient's current medications.  Conditions of Recording:  This is a 16 channel EEG carried out with the patient in the drowsy state.  Description:  The waking background activity consists of a low voltage, symmetrical, fairly well organized, theta activity, seen from the parieto-occipital and posterior temporal regions. Brief periods of posterior alpha activity is noted.  No focal slowing or epileptiform activity is noted.     Hyperventilation was not performed. Intermittent photic stimulation was performed but failed to illicit any change in the tracing.    IMPRESSION: Abnormal EEG due to the presence of generalized slowing indicating a mild cerebral disturbance (encephalopathy). No epileptiform activity noted.    Elspeth Cho, DO [facility] [phone]  If 7pm- 7am, please page neurology on call as listed in AMION. 10/19/2015, 12:21 PM

## 2015-10-20 DIAGNOSIS — R569 Unspecified convulsions: Secondary | ICD-10-CM

## 2015-10-20 DIAGNOSIS — J189 Pneumonia, unspecified organism: Secondary | ICD-10-CM

## 2015-10-20 LAB — GLUCOSE, CAPILLARY
GLUCOSE-CAPILLARY: 358 mg/dL — AB (ref 65–99)
GLUCOSE-CAPILLARY: 77 mg/dL (ref 65–99)
GLUCOSE-CAPILLARY: 248 mg/dL — AB (ref 65–99)
Glucose-Capillary: 156 mg/dL — ABNORMAL HIGH (ref 65–99)

## 2015-10-20 LAB — BRAIN NATRIURETIC PEPTIDE: B NATRIURETIC PEPTIDE 5: 280.3 pg/mL — AB (ref 0.0–100.0)

## 2015-10-20 MED ORDER — INSULIN GLARGINE 100 UNIT/ML ~~LOC~~ SOLN
15.0000 [IU] | Freq: Every day | SUBCUTANEOUS | Status: DC
  Administered 2015-10-20: 15 [IU] via SUBCUTANEOUS
  Filled 2015-10-20: qty 0.15

## 2015-10-20 MED ORDER — INSULIN GLARGINE 100 UNIT/ML ~~LOC~~ SOLN
13.0000 [IU] | Freq: Every day | SUBCUTANEOUS | Status: DC
  Administered 2015-10-21: 13 [IU] via SUBCUTANEOUS
  Filled 2015-10-20: qty 0.13

## 2015-10-20 MED ORDER — LEVOFLOXACIN 500 MG PO TABS: 500.0000 mg | ORAL_TABLET | Freq: Every day | ORAL | Status: DC

## 2015-10-20 MED ORDER — LEVOFLOXACIN 500 MG PO TABS: 500.0000 mg | ORAL_TABLET | ORAL | Status: DC

## 2015-10-20 NOTE — Progress Notes (Signed)
PROGRESS NOTE  DECLIN RAJAN AOZ:[id number] DOB: February 15, 1941 DOA: 10/18/2015 PCP: Kimber Relic, MD  HPI/Recap of past 24 hours: Patient is a 75 year old male with past medical history of hypothyroidism, diabetes mellitus on insulin, CAD, COPD and seizure disorder who is had a number of admissions in the past few months for recurrent seizures as well as hyperglycemia. He has been at nursing facilities and there have been concerns as to whether or not he is getting his medication. Patient brought in on 2/10 patient had another seizure as well as significant hyperglycemia after family brought patient home from assisted living facility. In the emergency room, negative sugars as high as 500-6 100s although not in DKA, hypotension. Other labs and vitals stable. Patient initially started on insulin drip plus IV Keppra. Admitted to hospitalist service and placed in stepdown unit. X-ray overnight noted new finding as compared to a few weeks ago patchy infiltrate and started on broad-spectrum antibiotics  By following morning, patient awake and alert. Oriented 2. No complaints. EEG done 2/11 noted diffuse slow wave findings consistent with encephalopathy.    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    Essential hypertension: Blood pressure stable, initially hypotensive likely from diuresis from hyperglycemia    COPD (chronic obstructive pulmonary disease) (HCC)Respiratory status stable.    CAD- moderate3V disease at cath 7/15- medical Rx: Stable.   Anemia- chronic    Partial symptomatic epilepsy with complex partial seizures, not intractable, without status epilepticus (HCC): On IV Keppra. EEG unrevealing. Neurology following.    Dementia arising in the senium and presenium: No acute behavioral disturbance    Aspiration  pneumonia (HCC)/healthcare associated pneumonia: On IV antibiotics   Code Status: Full code   Family Communication: Spoke with son by phone.   Disposition Plan: Ambulate, anticipate discharge tomorrow   Consultants:  Neurology  Procedures:  EEG done 2/11: Generalized slow wave findings consistent with encephalopathy  Antibiotics:  IV Zosyn and Vanco 2/11-2/12  Levaquin 2/12-present   Objective: BP 175/58 mmHg  Pulse 69  Temp(Src) 98.2 F (36.8 C) (Oral)  Resp 20  Ht  (1.676 m)  Wt 59 kg (130 lb 1.1 oz)  BMI 21.00 kg/m2  SpO2 100%  Intake/Output Summary (Last 24 hours) at 10/20/15 1452 Last data filed at 10/20/15 0840  Gross per 24 hour  Intake   1850 ml  Output    900 ml  Net    950 ml   Filed Weights   10/18/15 2131 10/19/15 0323 10/19/15 1954  Weight: 56.7 kg (125 lb) 58.4 kg (128 lb 12 oz) 59 kg (130 lb 1.1 oz)    Exam: Little change from previous day  General:  Alert and oriented 2, no acute distress   Cardiovascular: Regular rate and rhythm, S1-S2   Respiratory: Clear to auscultation bilaterally   Abdomen: Soft, nontender, nondistended, normoactive bowel sounds   Musculoskeletal: No clubbing or cyanosis or edema    Data Reviewed: Basic Metabolic Panel:  Recent Labs Lab 10/18/15 1940 10/19/15 0415 10/19/15 0846  NA 133* 140 136  K 4.8 3.8 4.5  CL 99* 108 108  CO2 21* 23 19*  GLUCOSE 519* 168* 147*  BUN 44* 33* 30*  CREATININE 1.62* 1.22 1.22  CALCIUM 9.7 8.9 8.9  MG  --  1.9  --   PHOS  --  3.8  --  Liver Function Tests:  Recent Labs Lab 10/18/15 1940 10/19/15 0415  AST 32 22  ALT 40 31  ALKPHOS 100 75  BILITOT 0.4 0.4  PROT 7.2 5.7*  ALBUMIN 4.1 3.0*   No results for input(s): LIPASE, AMYLASE in the last 168 hours. No results for input(s): AMMONIA in the last 168 hours. CBC:  Recent Labs Lab 10/18/15 1940 10/19/15 0415  WBC 6.6 5.6  NEUTROABS 4.7  --   HGB 10.2* 8.7*  HCT 30.0* 26.2*  MCV 91.7 91.6    PLT 258 214   Cardiac Enzymes:    Recent Labs Lab 10/19/15 0415 10/19/15 0846 10/19/15 1445  TROPONINI <0.03 <0.03 <0.03   BNP (last 3 results)  Recent Labs  10/20/15 1043  BNP 280.3*    ProBNP (last 3 results) No results for input(s): PROBNP in the last 8760 hours.  CBG:  Recent Labs Lab 10/19/15 0236 10/19/15 1822 10/19/15 2014 10/20/15 0739 10/20/15 1117  GLUCAP 180* 151* 299* 358* 77    Recent Results (from the past 240 hour(s))  Blood Culture (routine x 2)     Status: None (Preliminary result)   Collection Time: 10/18/15  8:54 PM  Result Value Ref Range Status   Specimen Description BLOOD RIGHT HAND  Final   Special Requests IN PEDIATRIC BOTTLE 3CC  Final   Culture   Final    NO GROWTH 1 DAY Performed at [facility]    Report Status PENDING  Incomplete  Blood Culture (routine x 2)     Status: None (Preliminary result)   Collection Time: 10/18/15  8:55 PM  Result Value Ref Range Status   Specimen Description BLOOD RIGHT HAND  Final   Special Requests IN PEDIATRIC BOTTLE 2CC  Final   Culture   Final    NO GROWTH 1 DAY Performed at [facility]    Report Status PENDING  Incomplete  MRSA PCR Screening     Status: None   Collection Time: 10/19/15  5:26 AM  Result Value Ref Range Status   MRSA by PCR NEGATIVE NEGATIVE Final    Comment:        The GeneXpert MRSA Assay (FDA approved for NASAL specimens only), is one component of a comprehensive MRSA colonization surveillance program. It is not intended to diagnose MRSA infection nor to guide or monitor treatment for MRSA infections.      Studies: No results found.  Scheduled Meds: . aspirin  81 mg Oral QHS  . budesonide-formoterol  2 puff Inhalation BID  . clopidogrel  75 mg Oral Q breakfast  . enoxaparin (LOVENOX) injection  40 mg Subcutaneous Q24H  . escitalopram  20 mg Oral Daily  . guaiFENesin  600 mg Oral BID  . insulin aspart  0-20 Units Subcutaneous TID WC  .  insulin glargine  15 Units Subcutaneous Daily  . levETIRAcetam  750 mg Oral BID  . [START ON 10/22/2015] levofloxacin  500 mg Oral Q48H  . levothyroxine  75 mcg Oral QAC breakfast  . methocarbamol  500 mg Oral 6 times per day  . metoprolol tartrate  12.5 mg Oral BID  . pantoprazole  40 mg Oral Daily  . pravastatin  20 mg Oral QPM  . pregabalin  100 mg Oral QHS  . pregabalin  50 mg Oral Daily  . saccharomyces boulardii  250 mg Oral QPM  . sodium chloride flush  3 mL Intravenous Q12H    Continuous Infusions: . sodium chloride Stopped (10/19/15 0323)  .  sodium chloride 125 mL/hr at 10/20/15 0840  . dextrose 5 % and 0.45% NaCl Stopped (10/19/15 1120)     Time spent: 25 minutes   Hollice Espy  [facility] Pager [phone] . If 7PM-7AM, please contact night-coverage at [email or web address], password [facility] 10/20/2015, 2:52 PM  LOS: 2 days

## 2015-10-20 NOTE — Evaluation (Signed)
Physical Therapy Evaluation Patient Details Name: Benjamin Hodge MRN: [id number] DOB: Nov 26, 1940 Today's Date: 10/20/2015   History of Present Illness  Patient is a 75 y/o male with hx of DM, CAD, COPD, depression, chronic pain with narcotis dependency presents from ALF with recurrent seizures and hyperglycemia with sugars 500-600. EEG done 2/11 noted diffuse slow wave findings consistent with encephalopathy  Clinical Impression  Patient presents with functional limitations due to deficits listed in PT problem list (see below). Pt with decreased strength, endurance, balance and safety awareness. Pt with fall history. Reluctant to use RW for support despite education on importance of using it to decrease falls. Would benefit from HHPT at ALF. Will follow acutely to maximize independence and mobility and decrease fall risk prior to return to [facility].     Follow Up Recommendations Home health PT;Supervision for mobility/OOB;Other (comment) (return to ALF if they can provide assist with ADls.)    Equipment Recommendations  Rolling walker with 5" wheels    Recommendations for Other Services OT consult     Precautions / Restrictions Precautions Precautions: Fall Restrictions Weight Bearing Restrictions: No      Mobility  Bed Mobility               General bed mobility comments: Sitting EOB upon PT arrival.   Transfers Overall transfer level: Needs assistance Equipment used: None Transfers: Sit to/from Stand Sit to Stand: Min guard         General transfer comment: Min guard for safety. Stood from [employer], from toilet x1.   Ambulation/Gait Ambulation/Gait assistance: Min guard Ambulation Distance (Feet): 250 Feet Assistive device: Rolling walker (2 wheeled);None Gait Pattern/deviations: Step-through pattern;Decreased stride length;Staggering left;Staggering right;Drifts right/left Gait velocity: mildly decreased   General Gait Details: Used RW initially but  pushed it away. Cues for RW proximity. Holding onto rail for support. Drifting to right/left. No overt LOB.  Stairs            Wheelchair Mobility    Modified Rankin (Stroke Patients Only)       Balance Overall balance assessment: Needs assistance Sitting-balance support: Feet supported;No upper extremity supported Sitting balance-Leahy Scale: Good Sitting balance - Comments: Able to donn boxers sitting EOB wtihout LOB.   Standing balance support: During functional activity Standing balance-Leahy Scale: Fair Standing balance comment: ABle to navigate room, get boxers out of bag, clean self up with supervision.                              Pertinent Vitals/Pain Pain Assessment: No/denies pain    Home Living Family/patient expects to be discharged to:: Assisted living [facility])               Home Equipment: Gilmer Mor - single point      Prior Function Level of Independence: Independent with assistive device(s)               Hand Dominance        Extremity/Trunk Assessment   Upper Extremity Assessment: Defer to OT evaluation           Lower Extremity Assessment: Generalized weakness         Communication   Communication: No difficulties  Cognition Arousal/Alertness: Awake/alert Behavior During Therapy: WFL for tasks assessed/performed Overall Cognitive Status: History of cognitive impairments - at baseline Area of Impairment: Safety/judgement         Safety/Judgement: Decreased awareness of safety  General Comments      Exercises        Assessment/Plan    PT Assessment Patient needs continued PT services  PT Diagnosis Difficulty walking;Generalized weakness   PT Problem List Decreased strength;Decreased activity tolerance;Decreased balance;Decreased mobility;Decreased cognition;Decreased knowledge of use of DME;Decreased safety awareness  PT Treatment Interventions Balance training;Gait  training;Patient/family education;Functional mobility training;Therapeutic activities;Therapeutic exercise;DME instruction   PT Goals (Current goals can be found in the Care Plan section) Acute Rehab PT Goals Patient Stated Goal: to get out of here PT Goal Formulation: With patient Time For Goal Achievement: 11/03/15 Potential to Achieve Goals: Good    Frequency Min 3X/week   Barriers to discharge        Co-evaluation               End of Session Equipment Utilized During Treatment: Gait belt Activity Tolerance: Patient tolerated treatment well Patient left: in bed;with call bell/phone within reach;with bed alarm set (sitting EOB.) Nurse Communication: Mobility status         Time: 4098-1191 PT Time Calculation (min) (ACUTE ONLY): 49 min   Charges:   PT Evaluation $PT Eval Moderate Complexity: 1 Procedure PT Treatments $Gait Training: 8-22 mins $Therapeutic Activity: 8-22 mins   PT G Codes:        Walida Cajas A Karron Goens 10/20/2015, 3:53 PM  ,Mylo Red, PT, DPT [phone]

## 2015-10-21 ENCOUNTER — Other Ambulatory Visit: Payer: Self-pay | Admitting: *Deleted

## 2015-10-21 DIAGNOSIS — R739 Hyperglycemia, unspecified: Secondary | ICD-10-CM

## 2015-10-21 LAB — CBC
HCT: 28.8 % — ABNORMAL LOW (ref 39.0–52.0)
Hemoglobin: 9.9 g/dL — ABNORMAL LOW (ref 13.0–17.0)
MCH: 31.9 pg (ref 26.0–34.0)
MCHC: 34.4 g/dL (ref 30.0–36.0)
MCV: 92.9 fL (ref 78.0–100.0)
PLATELETS: 201 10*3/uL (ref 150–400)
RBC: 3.1 MIL/uL — ABNORMAL LOW (ref 4.22–5.81)
RDW: 13 % (ref 11.5–15.5)
WBC: 5.2 10*3/uL (ref 4.0–10.5)

## 2015-10-21 LAB — LEGIONELLA ANTIGEN, URINE

## 2015-10-21 LAB — BASIC METABOLIC PANEL
ANION GAP: 7 (ref 5–15)
BUN: 16 mg/dL (ref 6–20)
CALCIUM: 9.6 mg/dL (ref 8.9–10.3)
CO2: 24 mmol/L (ref 22–32)
Chloride: 109 mmol/L (ref 101–111)
Creatinine, Ser: 1.07 mg/dL (ref 0.61–1.24)
GLUCOSE: 287 mg/dL — AB (ref 65–99)
Potassium: 4.7 mmol/L (ref 3.5–5.1)
Sodium: 140 mmol/L (ref 135–145)

## 2015-10-21 LAB — GLUCOSE, CAPILLARY
GLUCOSE-CAPILLARY: 374 mg/dL — AB (ref 65–99)
GLUCOSE-CAPILLARY: 64 mg/dL — AB (ref 65–99)
Glucose-Capillary: 243 mg/dL — ABNORMAL HIGH (ref 65–99)

## 2015-10-21 LAB — PROCALCITONIN

## 2015-10-21 MED ORDER — INSULIN ASPART 100 UNIT/ML ~~LOC~~ SOLN
0.0000 [IU] | Freq: Three times a day (TID) | SUBCUTANEOUS | Status: DC
Start: 2015-10-21 — End: 2015-10-21

## 2015-10-21 MED ORDER — INSULIN ASPART 100 UNIT/ML ~~LOC~~ SOLN: 0.0000 [IU] | Freq: Every day | SUBCUTANEOUS | Status: DC

## 2015-10-21 MED ORDER — INSULIN ASPART 100 UNIT/ML ~~LOC~~ SOLN: 3.0000 [IU] | Freq: Three times a day (TID) | SUBCUTANEOUS | Status: DC

## 2015-10-21 MED ORDER — INSULIN ASPART 100 UNIT/ML ~~LOC~~ SOLN: SUBCUTANEOUS | Status: DC

## 2015-10-21 MED ORDER — LEVOFLOXACIN 500 MG PO TABS: 500.0000 mg | ORAL_TABLET | ORAL | Status: AC

## 2015-10-21 MED ORDER — INSULIN ASPART 100 UNIT/ML ~~LOC~~ SOLN
0.0000 [IU] | Freq: Every day | SUBCUTANEOUS | Status: DC
Start: 2015-10-21 — End: 2015-10-21

## 2015-10-21 MED ORDER — LEVOFLOXACIN 500 MG PO TABS
500.0000 mg | ORAL_TABLET | Freq: Once | ORAL | Status: AC
Start: 2015-10-21 — End: 2015-10-21
  Administered 2015-10-21: 500 mg via ORAL
  Filled 2015-10-21: qty 1

## 2015-10-21 MED ORDER — SACCHAROMYCES BOULARDII 250 MG PO CAPS: 250.0000 mg | ORAL_CAPSULE | Freq: Every evening | ORAL | Status: AC

## 2015-10-21 NOTE — Evaluation (Signed)
Occupational Therapy Evaluation and Discharge Patient Details Name: Benjamin Hodge MRN: [id number] DOB: Nov 11, 1940 Today's Date: 10/21/2015    History of Present Illness Patient is a 75 y/o male with hx of DM, CAD, COPD, depression, chronic pain with narcotis dependency presents from ALF with recurrent seizures and hyperglycemia with sugars 500-600. EEG done 2/11 noted diffuse slow wave findings consistent with encephalopathy   Clinical Impression   This 75 yo male admitted with above presents to acute OT with deficits below that can be continued to be addressed by [facility] at ALF. Acute OT will sign off.    Follow Up Recommendations  Home health OT    Equipment Recommendations  Tub/shower seat       Precautions / Restrictions Precautions Precautions: Fall Restrictions Weight Bearing Restrictions: No      Mobility Bed Mobility Overal bed mobility: Modified Independent Bed Mobility: Supine to Sit;Sit to Supine     Supine to sit: Modified independent (Device/Increase time);HOB elevated Sit to supine: Modified independent (Device/Increase time)      Transfers Overall transfer level: Needs assistance Equipment used: None Transfers: Sit to/from Stand Sit to Stand: Supervision              Balance Overall balance assessment: Needs assistance Sitting-balance support: Feet supported;No upper extremity supported Sitting balance-Leahy Scale: Good     Standing balance support: Single extremity supported;During functional activity Standing balance-Leahy Scale: Poor Standing balance comment: reliant on propping against door frame to don boxers (even though I had just told him it would be safer to sit and don boxers, he still stood to do this--I re-iterated that it would be safer to sit to do this and he said yep, you just said that)--of note he did not lose balance while doing this.                            ADL Overall ADL's : Needs assistance/impaired                                        General ADL Comments: Overall at a S level due to mild decreased balance. The only A he needs for basic ADLs is in/out for tub transfers and A to tie shoes due to diabetic neuropathy in fingers               Pertinent Vitals/Pain Pain Assessment: Faces Faces Pain Scale: Hurts little more Pain Location: low back Pain Descriptors / Indicators: Aching;Sore Pain Intervention(s): Limited activity within patient's tolerance;Repositioned     Hand Dominance Right      Communication Communication Communication: No difficulties   Cognition Arousal/Alertness: Awake/alert Behavior During Therapy: WFL for tasks assessed/performed Overall Cognitive Status: No family/caregiver present to determine baseline cognitive functioning Area of Impairment: Safety/judgement (prefers not to use a RW even though would make him safer)                              Home Living Family/patient expects to be discharged to:: Assisted living [facility], had only been there one day when he came to hospital, prior to this he was lving with his son)     Type of Home: Assisted living  Prior Functioning/Environment Level of Independence: Independent with assistive device(s)             OT Diagnosis: Generalized weakness;Cognitive deficits   OT Problem List: Decreased strength;Decreased knowledge of use of DME or AE;Impaired balance (sitting and/or standing);Pain      OT Goals(Current goals can be found in the care plan section) Acute Rehab OT Goals Patient Stated Goal: to leave today since that is what the doctor told me yesterday  OT Frequency: Min 2X/week              End of Session Nurse Communication:  (nurse in room while I was working with pt)  Activity Tolerance: Patient tolerated treatment well Patient left: in bed;with call bell/phone within reach;with bed alarm set    Time: 5631-4970 OT Time Calculation (min): 20 min Charges:  OT General Charges $OT Visit: 1 Procedure OT Evaluation $OT Eval Moderate Complexity: 1 Procedure  Evette Georges [phone] 10/21/2015, 11:48 AM

## 2015-10-21 NOTE — Clinical Social Work Note (Signed)
Clinical Social Worker had lengthy conversation with patient's dtr, Thayer Ohm in regards to facility policies and procedures, FL-2 and insulin instructions. CSW explained that likely all facility requires patient and or family members to sign for financial responsibility. CSW advised pt's dtr-in-law to discuss insulin instructions and other medical questions.  Clinical Social Worker facilitated patient discharge including contacting patient family and facility to confirm patient discharge plans.  Clinical information faxed to facility and family agreeable with plan. Patient's dtr-in-law, Thayer Ohm to transport patient back to [facility] ALF. RNCM has contacted facility to arrange Home Health. RN to call report prior to discharge [phone].  Clinical Social Worker will sign off for now as social work intervention is no longer needed. Please consult [place] again if new need arises.  Derenda Fennel, MSW, LCSWA [phone] 10/21/2015 4:19 PM

## 2015-10-21 NOTE — Patient Outreach (Signed)
[facility]) Care Management  10/21/2015  Benjamin Hodge Jul 19, 1941 [id number]   CSW sent an In Basket Message to patient's RNCM with [place] [facility], [name] informing her of patient's discharge from [facility] today.  Patient was admitted into the hospital on Friday, October 18, 2015, after being placed at [facility] [place] for long-term care services on Thursday, October 17, 2015.  Prior to admission into [facility], patient was receiving short-term rehabilitative services at [place], after discharge from a previous hospital admission.  Upon discharge from the hospital today, patient will return to [facility] via patient's son, [name].  No additional social work needs identified at present. CSW will perform a case closure on patient, as all goals of treatment have been met from social work standpoint and no additional social work needs have been identified at this time. CSW will notify patient's RNCM with [place] [facility], [name] of CSW's plans to close patient's case. CSW will fax a correspondence letter to patient's Primary Care Physician, Dr. Jeanmarie Hubert to ensure that Dr. Nyoka Cowden is aware of CSW's plans to close case.   CSW will submit a case closure request to Lurline Del, Care Management Assistant with [place] [facility], in the form of an In [employer].  CSW will ensure that Mrs. Laurance Flatten is aware of Rod Can, RNCM with [place] [facility], continued involvement with patient's care. Nat Christen, BSW, MSW, LCSW  Licensed Education officer, environmental Health System  Mailing [place] [address] [place], [place] [address] Physical Address-[address], [place], [place] [address] Toll Free Main #  [phone] Fax # [phone] Cell # [phone]  Fax # [phone]  [email or web address] complies with [employer] civil rights laws and does not discriminate on the basis of race, color, national origin, age, disability, or sex.  Espaol (Spanish)  [place] cumple con las leyes federales de derechos civiles aplicables y no discrimina por motivos de raza, color, nacionalidad, edad, discapacidad o sexo.     Ti[place] Vi[place]t ([place])  [place] tun th? lu?t dn quy?n hi?n hnh c?a Lin bang v khng phn bi?t ?i x? d?a trn ch?ng t?c, mu da, ngu?n g?c qu?c gia, ? tu?i, khuy?t t?t, ho?c gi?i tnh.     (Arabic)    [place]

## 2015-10-21 NOTE — Care Management Note (Signed)
Case Management Note  Patient Details  Name: YOSEPH HAILE MRN: [id number] Date of Birth: Jan 06, 1941  Subjective/Objective:              CM following for progression and d/c planning.      Action/Plan: 10/21/2015 Met with pt and daughter re d/c needs, pt will return to ALF, [facility]. No DME needed per daughter ( who has PT background). [place] services will be provided by [place] per preference of pt ALF, [facility]. Call and message left for Linus Mako, rep for [place].   Expected Discharge Date:   (unknown)               Expected Discharge Plan:  Assisted Living / Rest Home  In-House Referral:  Clinical Social Work  Discharge planning Services  CM Consult  Post Acute Care Choice:  NA Choice offered to:  NA  DME Arranged:  [name] DME Agency:  NA  HH Arranged:  RN, PT, OT HH Agency:  [facility] [facility]  Status of Service:  Completed, signed off  Medicare Important Message Given:    Date Medicare IM Given:    Medicare IM give by:    Date Additional Medicare IM Given:    Additional Medicare Important Message give by:     If discussed at [place] of Stay Meetings, dates discussed:    Additional Comments:  Adron Bene, RN 10/21/2015, 4:15 PM

## 2015-10-21 NOTE — Evaluation (Signed)
Clinical/Bedside Swallow Evaluation Patient Details  Name: Benjamin Hodge MRN: [id number] Date of Birth: [birth year]  Today's Date: 10/21/2015 Time: SLP Start Time (ACUTE ONLY): 0844 SLP Stop Time (ACUTE ONLY): 0859 SLP Time Calculation (min) (ACUTE ONLY): 15 min  Past Medical History:  Past Medical History  Diagnosis Date  . CAD (coronary artery disease)     LAD 40-50% stenosis, first diagonal small 90% stenosis, circumflex 70% stenosis, OM 80% stenosis, right coronary artery 80-90% stenosis.  . Right upper lobe pneumonia 11/28/2008  . Anxiety 07/18/2008  . Irritable bowel syndrome 04/27/2008  . COPD (chronic obstructive pulmonary disease) (HCC) 02/27/2008  . B12 deficiency 02/16/2008    in 5/09: B12 262, MMA 1040  . Chronic diarrhea 01/01/2008    s/p EGD 10/06: H pylori + gastritis, duodenal biopsy normal (Dr. Elnoria Howard); s/p colonoscopy 10/06: hemorrhoids (Dr. Elnoria Howard); evaluated by Dr. Yancey Flemings  in 8/09: tissue transglutaminase AB negative, VIP normal, stool fat content normal, urine 5-HIAA normal; normal GES 11/09 (done for "fluctuating sugars")  . 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 Orthostatic hypotension 02/09/2007  . Hypertension 02/09/2007  . Spinal stenosis in cervical region 05/03    MRI  . Erectile dysfunction 09/27/2006    s/p penile implant  . Anemia, iron deficiency 09/27/2006    neg colonoscopy 2006 - Dr. Elnoria Howard; ferritin 152; hgb  15.7  on 07/07  . Hypothyroidism 09/27/2006    TSH 2.639  07/07  . Hypersomnia 09/27/2006    evaluated by Dr. Jetty Duhamel (5/08) and Dr. Vickey Huger; PSG 8/09: chronic delayed sleep phase syndrome (patient sleeps during the day and is awake at night), nocturnal myoclonus (eval for RLS and IDA suggested). Pt advised to change sleeping behavior  . Diabetes mellitus type II 09/07/1984    poorly controlled, complicated by peripheral neuropathy, microalbuminuria, mild non proliferative retinopathy, s/p DKA 7/05, on  insulin pump x 4/09, started a pump vacation on 11/19/2008  . Hyperlipidemia   . 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 Hemorrhoids   . Chronic kidney disease   . Unspecified hereditary and idiopathic peripheral neuropathy   . Other chronic pain   . Other malaise and fatigue   . Depression   . Hypertrophy of prostate without urinary obstruction and other lower urinary tract symptoms (LUTS)   . Lumbago   . Heart attack (HCC) 03/2014    mild  . Renal mass, right: Incentendal finding MRI 10/02/2015 10/03/2015  . Hypogonadism in male 10/03/2015   Past Surgical History:  Past Surgical History  Procedure Laterality Date  . Penile prosthesis implant    . Tonsillectomy    . Colonoscopy  06/25/2005    Dr. Jeani Hawking  . Left heart catheterization with coronary angiogram [name] 03/13/2014    Procedure: LEFT HEART CATHETERIZATION WITH CORONARY ANGIOGRAM;  Surgeon: Peter M Swaziland, MD;  Location: [facility] C[facility] LAB;  Service: Cardiovascular;  Laterality: [name];  . Peripheral vascular catheterization [name] 02/19/2015    Procedure: Abdominal Aortogram;  Surgeon: Nada Libman, MD;  Location: [facility] INVASIVE CV LAB;  Service: Cardiovascular;  Laterality: [name];  . Peripheral vascular catheterization Left 02/19/2015    Procedure: Lower Extremity Angiography;  Surgeon: Nada Libman, MD;  Location: [facility] INVASIVE CV LAB;  Service: Cardiovascular;  Laterality: Left;   HPI:  Patient is a 75 y/o male with hx of right upper lobe pna, cervical spinal stenosis, DM, CAD, COPD, depression, chronic pain with narcotis dependency presents from ALF with recurrent seizures and hyperglycemia with sugars 500-600. EEG done  2/11 noted diffuse slow wave findings consistent with encephalopathy. CT Chronic lacunar infarct at the left basal ganglia, no acute abnormality. CXR patchy bronchopneumonia involving the right lung base. No prior ST notes.   Assessment / Plan / Recommendation Clinical Impression  Pt's risk factors for dysphagia include prior CVA,  COPD and pt description of possible GER. Oral and pharyngeal musculature appear strong, adequate ROM on palpation. Vocal quality clear. No s/s aspiration. Pt's recent CXR's did not indicate pna. Pt describes globus sensation as well as feeling of "food coming back up". SLP educated pt on esophageal strategies (alternate liq/solid, stay upright min 30 minutes after meals, eat slowly). Continue regular texture, liquids. Encouraged pt to discuss esophageal symptoms with MD for further work up if needed. No follow up needed.      Aspiration Risk   (mild-mod)    Diet Recommendation Regular;Thin liquid   Liquid Administration via: Cup;Straw Medication Administration: Whole meds with liquid Supervision: Patient able to self feed Compensations: Slow rate;Small sips/bites;Follow solids with liquid Postural Changes: Seated upright at 90 degrees;Remain upright for at least 30 minutes after po intake    Other  Recommendations Oral Care Recommendations: Oral care BID   Follow up Recommendations  None    Frequency and Duration            Prognosis        Swallow Study   General HPI: Patient is a 75 y/o male with hx of right upper lobe pna, cervical spinal stenosis, DM, CAD, COPD, depression, chronic pain with narcotis dependency presents from ALF with recurrent seizures and hyperglycemia with sugars 500-600. EEG done 2/11 noted diffuse slow wave findings consistent with encephalopathy. CT Chronic lacunar infarct at the left basal ganglia, no acute abnormality. CXR patchy bronchopneumonia involving the right lung base. No prior ST notes. Type of Study: Bedside Swallow Evaluation Previous Swallow Assessment:  (none) Diet Prior to this Study: Regular;Thin liquids Temperature Spikes Noted: No Respiratory Status: Room air History of Recent Intubation: No Behavior/Cognition: Alert;Cooperative;Pleasant mood Oral Cavity Assessment: Within Functional Limits Oral Care Completed by SLP: No Oral Cavity -  Dentition: Adequate natural dentition Vision: Functional for self-feeding Self-Feeding Abilities: Able to feed self Patient Positioning: Upright in bed Baseline Vocal Quality: Normal Volitional Cough: Strong Volitional Swallow: Able to elicit    Oral/Motor/Sensory Function Overall Oral Motor/Sensory Function: Within functional limits   Ice Chips Ice chips: Not tested   Thin Liquid Thin Liquid: Within functional limits Presentation: Cup;Straw    Nectar Thick Nectar Thick Liquid: Not tested   Honey Thick Honey Thick Liquid: Not tested   Puree Puree: Within functional limits   Solid   GO   Solid: Within functional limits        Royce Macadamia 10/21/2015,9:12 AM  Breck Coons Lonell Face.Ed [employer] [phone]

## 2015-10-21 NOTE — Progress Notes (Signed)
Inpatient Diabetes Program Recommendations  AACE/ADA: New Consensus Statement on Inpatient Glycemic Control (2015)  Target Ranges:  Prepandial:   less than 140 mg/dL      Peak postprandial:   less than 180 mg/dL (1-2 hours)      Critically ill patients:  140 - 180 mg/dL  Results for CORRION, SMISEK (MRN [id number]) as of 10/21/2015 08:44  Ref. Range 10/20/2015 07:39 10/20/2015 11:17 10/20/2015 16:22 10/20/2015 20:48 10/21/2015 07:24  Glucose-Capillary Latest Ref Range: 65-99 mg/dL 315 (H) 77 945 (H) 859 (H) 243 (H)   Review of Glycemic Control  Diabetes history: DM 1 Outpatient Diabetes medications: Toujeo 11 units, Humalog 4-9 units TID with meals Current orders for Inpatient glycemic control: Lantus 13 units, Novolog Resistant TID  Inpatient Diabetes Program Recommendations: Correction (SSI): With patient being a DM type 1 could be very sensitive to insulin. Patients glucose went from 300's to 70's with 20 of Novolog. Please consider decreasing correction to Novolog Sensitive and adding Novolog 3 units TID meal coverage. Also consider HS coverage.  Thanks,  Christena Deem RN, MSN, [facility] Inpatient Diabetes Coordinator Team Pager [phone] (8a-5p)

## 2015-10-21 NOTE — Progress Notes (Signed)
Physical Therapy Treatment Patient Details Name: Benjamin Hodge MRN: [id number] DOB: November 26, 1940 Today's Date: 10/21/2015    History of Present Illness Patient is a 75 y/o male with hx of DM, CAD, COPD, depression, chronic pain with narcotis dependency presents from ALF with recurrent seizures and hyperglycemia with sugars 500-600. EEG done 2/11 noted diffuse slow wave findings consistent with encephalopathy    PT Comments    Pt able to ambulate 275' without RW, but using rail and states he feels he can do it cause therapist is on the other side of him.  Educated on RW and how it could give him more support at ALF for when he didn't have someone walking with him.  Pt does not think he needs RW, but until he gets stronger would recommend as he is reliant on hall rail and having a person next to him for confidence. Recommend HHPT at ALF.  Follow Up Recommendations  Home health PT;Supervision for mobility/OOB     Equipment Recommendations  Rolling walker with 5" wheels    Recommendations for Other Services       Precautions / Restrictions Precautions Precautions: Fall Restrictions Weight Bearing Restrictions: No    Mobility  Bed Mobility Overal bed mobility: Modified Independent Bed Mobility: Supine to Sit;Sit to Supine     Supine to sit: Modified independent (Device/Increase time);HOB elevated Sit to supine: Modified independent (Device/Increase time)   General bed mobility comments: Sitting EOB upon PT arrival.   Transfers Overall transfer level: Needs assistance Equipment used: None Transfers: Sit to/from Stand Sit to Stand: Supervision         General transfer comment: S from bed with wide BOS  Ambulation/Gait Ambulation/Gait assistance: Min guard Ambulation Distance (Feet): 275 Feet Assistive device: None (use of rail ~50-75%) Gait Pattern/deviations: Step-through pattern Gait velocity: decreased, guarded due to back pain and neuropathy   General Gait  Details: Pt with neuropathic type gait. Amb holding onto the rail and stating he feels like he is good cause he has the PT next to him.  Education on safety as he will not have PT with him at ALF walking next to him.   Stairs            Wheelchair Mobility    Modified Rankin (Stroke Patients Only)       Balance Overall balance assessment: Needs assistance Sitting-balance support: Feet supported Sitting balance-Leahy Scale: Good     Standing balance support: Single extremity supported;During functional activity Standing balance-Leahy Scale: Fair Standing balance comment: reliant on propping against door frame to don boxers (even though I had just told him it would be safer to sit and don boxers, he still stood to do this--I re-iterated that it would be safer to sit to do this and he said yep, you just said that)--of note he did not lose balance while doing this.                    Cognition Arousal/Alertness: Awake/alert Behavior During Therapy: WFL for tasks assessed/performed Overall Cognitive Status: Within Functional Limits for tasks assessed Area of Impairment: Safety/judgement         Safety/Judgement: Decreased awareness of safety          Exercises      General Comments General comments (skin integrity, edema, etc.): Education on safety and use of RW vs rail/ therapist      Pertinent Vitals/Pain Pain Assessment: Faces Faces Pain Scale: Hurts little more Pain Location: low back Pain  Descriptors / Indicators: Aching;Sore Pain Intervention(s): Limited activity within patient's tolerance;Repositioned    Home Living Family/patient expects to be discharged to:: Assisted living [facility], had only been there one day when he came to hospital, prior to this he was lving with his son)     Type of Home: Assisted living              Prior Function Level of Independence: Independent with assistive device(s)          PT Goals (current  goals can now be found in the care plan section) Acute Rehab PT Goals Patient Stated Goal: to leave today since that is what the doctor told me yesterday PT Goal Formulation: With patient Time For Goal Achievement: 11/03/15 Potential to Achieve Goals: Good Progress towards PT goals: Progressing toward goals    Frequency  Min 3X/week    PT Plan Current plan remains appropriate    Co-evaluation             End of Session Equipment Utilized During Treatment: Gait belt Activity Tolerance: Patient tolerated treatment well Patient left: in bed;with call bell/phone within reach;with bed alarm set;Other (comment) (sitting EOB, family in hall speaking with MD)     Time: 5110-2111 PT Time Calculation (min) (ACUTE ONLY): 19 min  Charges:  $Gait Training: 8-22 mins                    G Codes:      Taura Lamarre LUBECK 10/21/2015, 2:04 PM

## 2015-10-21 NOTE — Discharge Summary (Addendum)
Discharge Summary  Benjamin Hodge ZOX:[id number] DOB: [birth year]  PCP: Kimber Relic, MD  Admit date: 10/18/2015 Discharge date: 10/21/2015  Time spent: 25 minutes   Recommendations for Outpatient Follow-up:  1. New medication: Levaquin 750 mg on 2/14 and 2/16 2. Medication adjustment: Patient will continue Humalog sliding scale +3 units 3 times a day scheduled with meals 3. Patient has follow-up appointments with his neurologist and endocrinologist of next few weeks which she will keep  Discharge Diagnoses:  Active Hospital Problems   Diagnosis Date Noted  . Hyperglycemia 10/18/2015  . Hyperglycemic crisis in diabetes mellitus (HCC) 10/18/2015  . Aspiration pneumonia (HCC) 10/18/2015  . Dehydration   . CKD (chronic kidney disease), stage II 09/30/2015  . Hyperglycemia without ketosis 09/13/2015  . Partial symptomatic epilepsy with complex partial seizures, not intractable, without status epilepticus (HCC) 07/16/2015  . Dementia arising in the senium and presenium 07/16/2015  . Seizures (HCC) 04/10/2015  . PVD (peripheral vascular disease) (HCC) 02/18/2015  . Anemia- chronic 04/03/2014  . CAD- moderate3V disease at cath 7/15- medical Rx 03/28/2014  . COPD (chronic obstructive pulmonary disease) (HCC) 02/27/2008  . Type 1 diabetes mellitus with neurological manifestations, uncontrolled (HCC) 09/27/2006  . Essential hypertension 09/27/2006    Resolved Hospital Problems   Diagnosis Date Noted Date Resolved  No resolved problems to display.    Discharge Condition: Improved, being discharged back to assisted living facility   Diet recommendation: Carb modified, heart healthy   Filed Weights   10/19/15 0323 10/19/15 1954 10/20/15 2050  Weight: 58.4 kg (128 lb 12 oz) 59 kg (130 lb 1.1 oz) 63 kg (138 lb 14.2 oz)    History of present illness:  Patient is a 75 year old male with past medical history of hypothyroidism, diabetes mellitus on insulin, CAD, COPD and seizure  disorder who is had a number of admissions in the past few months for recurrent seizures as well as hyperglycemia. He has been at nursing facilities and there have been concerns as to whether or not he is getting his medication. Patient brought in on 2/10 patient had another seizure as well as significant hyperglycemia after family brought patient home from assisted living facility. In the emergency room, negative sugars as high as 500-6 100s although not in DKA, hypotension. Other labs and vitals stable. Patient initially started on insulin drip plus IV Keppra. Admitted to hospitalist service and placed in stepdown unit.    Hospital Course:  Active Problems:   Type 1 diabetes mellitus with neurological manifestations, uncontrolled (HCC) with stage I chronic kidney disease: Once patient off insulin drip, Lantus started. There were some adjustment issues and overcorrection with aggressive sliding scale. Diabetes educator recommended discharging patient on home basal insulin plus sensitive sliding scale plus scheduled 3 units NovoLog with meals and follow-up. Patient has an endocrinology appointment in 1 week    Essential hypertension: Initially hypotensive, likely from diuresis from hyperglycemia, blood pressure now stable.    COPD (chronic obstructive pulmonary disease) (HCC): Stable medical issues during this hospitalization.    CAD- moderate3V disease at cath 7/15- medical Rx: Stable   Anemia- chronic   PVD (peripheral vascular disease) (HCC): Stable    Partial symptomatic epilepsy with complex partial seizures, not intractable, without status epilepticus [facility]): Patient remained on IV Keppra during his hospitalization. EEG unrevealing. Neurology followed and recommended continuing on his normal dose of Keppra upon discharge   Dementia arising in the senium and presenium: Overall stable.    CKD (chronic kidney disease), stage  II: Stable     Aspiration pneumonia (HCC) /Healthcare associated  pneumonia: X-ray finding of pneumonia as compared to recent film a few weeks before. Patient evaluated for swallowing by speech therapy who found no aspiration deficits. However given hyperglycemia, lower seizure threshold, treated for presumed pneumonia.   Consultants:  Neurology  Procedures:  EEG done 2/11: Generalized slow wave findings consistent with encephalopathy  Discharge Exam: BP 158/91 mmHg  Pulse 74  Temp(Src) 98.1 F (36.7 C) (Oral)  Resp 21  Ht 5\' 6"  (1.676 m)  Wt 63 kg (138 lb 14.2 oz)  BMI 22.43 kg/m2  SpO2 97%  General: Alert and oriented 2 Cardiovascular: Regular rate and rhythm, S1-S2  Respiratory: Clear to auscultation bilaterally   Discharge Instructions You were cared for by a hospitalist during your hospital stay. If you have any questions about your discharge medications or the care you received while you were in the hospital after you are discharged, you can call the unit and asked to speak with the hospitalist on call if the hospitalist that took care of you is not available. Once you are discharged, your primary care physician will handle any further medical issues. Please note that NO REFILLS for any discharge medications will be authorized once you are discharged, as it is imperative that you return to your primary care physician (or establish a relationship with a primary care physician if you do not have one) for your aftercare needs so that they can reassess your need for medications and monitor your lab values.  Discharge Instructions    Diet - low sodium heart healthy    Complete by:  As directed      Increase activity slowly    Complete by:  As directed             Medication List    STOP taking these medications        HUMALOG KWIKPEN 100 UNIT/ML KiwkPen  Generic drug:  insulin lispro      TAKE these medications        aspirin 81 MG tablet  Take 81 mg by mouth at bedtime.     ATIVAN 2 MG/ML injection  Generic drug:  LORazepam    Inject 0.5 mg into the muscle every 6 (six) hours as needed for anxiety or seizure.     budesonide-formoterol 160-4.5 MCG/ACT inhaler  Commonly known as:  SYMBICORT  Inhale 2 puffs into the lungs 2 (two) times daily.     clopidogrel 75 MG tablet  Commonly known as:  PLAVIX  TAKE 1 TABLET BY MOUTH DAILY WITH BREAKFAST     diphenoxylate-atropine 2.5-0.025 MG tablet  Commonly known as:  LOMOTIL  One up to 4 times daily if needed for diarrhea     escitalopram 20 MG tablet  Commonly known as:  LEXAPRO  TAKE ONE TABLET BY MOUTH ONE TIME DAILY     HYDROcodone-acetaminophen 5-325 MG tablet  Commonly known as:  NORCO/VICODIN  Take 1 tablet by mouth every 6 (six) hours as needed for moderate pain or severe pain.     insulin aspart 100 UNIT/ML injection  Commonly known as:  novoLOG  Inject 3 Units into the skin 3 (three) times daily with meals.     insulin aspart 100 UNIT/ML injection  Commonly known as:  novoLOG  Sliding Scale: If BS 70-159=4 units, 160-215=5 units, 220-279=6 units, 280-339=7 units, 340-399=8 units. If greater than 400 give 9 units     insulin aspart 100 UNIT/ML injection  Commonly known as:  novoLOG  Sliding Scale: If BS 70-199=0 units, 200-249=2 units, 250-299=3 units, 300-349=4 units, 350-399=5 units. If greater than 400 give 6 units     levETIRAcetam 750 MG tablet  Commonly known as:  KEPPRA  Take 750 mg by mouth 2 (two) times daily. For seizures     levofloxacin 500 MG tablet  Commonly known as:  LEVAQUIN  Take 1 tablet (500 mg total) by mouth every other day.  Start taking on:  10/22/2015     levothyroxine 75 MCG tablet  Commonly known as:  SYNTHROID, LEVOTHROID  Take 1 tablet (75 mcg total) by mouth daily.     methocarbamol 500 MG tablet  Commonly known as:  ROBAXIN  Take 500 mg by mouth every 6 (six) hours as needed for muscle spasms.     metoprolol tartrate 25 MG tablet  Commonly known as:  LOPRESSOR  Take One tablet by mouth twice daily to  control BP     multivitamin with minerals Tabs tablet  Take 1 tablet by mouth daily.     ONE TOUCH TEST STRIPS test strip  Generic drug:  glucose blood  1 each by Other route 4 (four) times daily. Use as instructed     pantoprazole 40 MG tablet  Commonly known as:  PROTONIX  TAKE ONE TABLET BY MOUTH ONE TIME DAILY     pravastatin 20 MG tablet  Commonly known as:  PRAVACHOL  Take 1 tablet (20 mg total) by mouth every evening. NEEDS APPOINTMENT FOR FUTURE REFILLS OR 90-DAY SUPPLY     pregabalin 50 MG capsule  Commonly known as:  LYRICA  Take one capsule by mouth in the morning and two capsules by mouth in the evening for pains.     saccharomyces boulardii 250 MG capsule  Commonly known as:  FLORASTOR  Take 1 capsule (250 mg total) by mouth every evening.     tiotropium 18 MCG inhalation capsule  Commonly known as:  SPIRIVA  Place 1 capsule (18 mcg total) into inhaler and inhale daily.     TOUJEO SOLOSTAR 300 UNIT/ML Sopn  Generic drug:  Insulin Glargine  Inject 11 Units into the skin at bedtime.     vitamin B-12 1000 MCG tablet  Commonly known as:  CYANOCOBALAMIN  Take 1,000 mcg by mouth daily.        Allergies  Allergen Reactions  . Ace Inhibitors Other (See Comments)    dizzy  . Cymbalta [Duloxetine Hcl] Other (See Comments)    dizzy  . Lipitor [Atorvastatin] Other (See Comments)    Caused pain all over body.  . Bee Venom Swelling  . Glucerna [Nutritional Supplements] Diarrhea  . Angiotensin Receptor Blockers Other (See Comments)    unknown       Follow-up Information    Follow up with GREEN, Lenon Curt, MD In 1 week.   Specialty:  Internal Medicine   Contact information:   [address] [place] [address] [phone]        The results of significant diagnostics from this hospitalization (including imaging, microbiology, ancillary and laboratory) are listed below for reference.    Significant Diagnostic Studies: Dg Chest 1 View  09/27/2015   CLINICAL DATA:  Hyperglycemia and altered mental status, COPD EXAM: CHEST  1 VIEW COMPARISON:  09/14/2015 FINDINGS: Cardiac shadow is within normal limits. The lungs are well aerated bilaterally. Elevation of the left hemidiaphragm is again seen. No focal infiltrate or sizable effusion is noted. IMPRESSION: No  acute abnormality noted. Electronically Signed   By: Alcide Clever M.D.   On: 09/27/2015 18:56   Ct Head Wo Contrast  10/18/2015  CLINICAL DATA:  Acute onset of hyperglycemia. Generalized weakness. Initial encounter. EXAM: CT HEAD WITHOUT CONTRAST TECHNIQUE: Contiguous axial images were obtained from the base of the skull through the vertex without intravenous contrast. COMPARISON:  CT of the head performed 09/27/2015, and MRI of the brain performed 09/28/2015 FINDINGS: There is no evidence of acute infarction, mass lesion, or intra- or extra-axial hemorrhage on CT. Prominence of the ventricles and sulci suggest mild cortical volume loss. Mild periventricular and subcortical white matter change likely reflects small vessel ischemic microangiopathy. A chronic lacunar infarct is noted at the left basal ganglia. The brainstem and fourth ventricle are within normal limits. The cerebral hemispheres demonstrate grossly normal gray-white differentiation. No mass effect or midline shift is seen. There is no evidence of fracture; visualized osseous structures are unremarkable in appearance. The orbits are within normal limits. The paranasal sinuses and mastoid air cells are well-aerated. No significant soft tissue abnormalities are seen. IMPRESSION: 1. No acute intracranial pathology seen on CT. 2. Mild cortical volume loss and scattered small vessel ischemic microangiopathy. 3. Chronic lacunar infarct at the left basal ganglia. Electronically Signed   By: Roanna Raider M.D.   On: 10/18/2015 22:28   Ct Head Wo Contrast  09/27/2015  CLINICAL DATA:  Acute onset of confusion and altered mental status. Hyperglycemia.  Recent fall at home. Initial encounter. EXAM: CT HEAD WITHOUT CONTRAST TECHNIQUE: Contiguous axial images were obtained from the base of the skull through the vertex without intravenous contrast. COMPARISON:  CT of the head performed 04/30/2015 FINDINGS: There is no evidence of acute infarction, mass lesion, or intra- or extra-axial hemorrhage on CT. Prominence of the ventricles and sulci reflects mild to moderate cortical volume loss. Scattered periventricular white matter change likely reflects small vessel ischemic microangiopathy. Small chronic lacunar infarcts are suggested within the basal ganglia bilaterally. The brainstem and fourth ventricle are within normal limits. The cerebral hemispheres demonstrate grossly normal gray-white differentiation. No mass effect or midline shift is seen. There is no evidence of fracture; visualized osseous structures are unremarkable in appearance. The orbits are within normal limits. The paranasal sinuses and mastoid air cells are well-aerated. No significant soft tissue abnormalities are seen. IMPRESSION: 1. No acute intracranial pathology seen on CT. 2. Mild to moderate cortical volume loss and scattered small vessel ischemic microangiopathy. 3. Small chronic lacunar infarcts suggested within the basal ganglia bilaterally. Electronically Signed   By: Roanna Raider M.D.   On: 09/27/2015 18:42   Mr Benjamin Hodge ZO Contrast  09/28/2015  CLINICAL DATA:  75 year old hypertensive diabetic male with hyperlipidemia presenting with reduced level of consciousness. Subsequent encounter. EXAM: MRI HEAD WITHOUT AND WITH CONTRAST TECHNIQUE: Multiplanar, multiecho pulse sequences of the brain and surrounding structures were obtained without and with intravenous contrast. CONTRAST:  11mL MULTIHANCE GADOBENATE DIMEGLUMINE 529 MG/ML IV SOLN COMPARISON:  09/27/2015 CT. FINDINGS: No acute infarct or intracranial hemorrhage. Mild small vessel disease type changes. Moderate global atrophy  without hydrocephalus. No intracranial mass or abnormal enhancement. 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 Minimal exophthalmos. Cervical medullary junction, pituitary region and pineal region unremarkable. Major intracranial vascular structures are patent with ectatic vertebral arteries and basilar artery. Minimal partial opacification left mastoid air cells. Polypoid opacification left maxillary sinus with minimal mucosal thickening right maxillary sinus and ethmoid sinus air cells. IMPRESSION: No  acute infarct or intracranial hemorrhage. Mild small vessel disease type changes. Moderate global atrophy without hydrocephalus. No intracranial mass or abnormal enhancement. 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 Electronically Signed   By: Lacy Duverney M.D.   On: 09/28/2015 15:11   Mr 3d Recon At Scanner  10/02/2015  CLINICAL DATA:  Weight loss. Hypertension. Hyperlipidemia. CEA and CA 19 elevation. Chronic kidney disease. EXAM: MRI ABDOMEN WITHOUT AND WITH CONTRAST (INCLUDING MRCP) TECHNIQUE: Multiplanar multisequence MR imaging of the abdomen was performed both before and after the administration of intravenous contrast. Heavily T2-weighted images of the biliary and pancreatic ducts were obtained, and three-dimensional MRCP images were rendered by post processing. CONTRAST:  51mL MULTIHANCE GADOBENATE DIMEGLUMINE 529 MG/ML IV SOLN COMPARISON:  Aortic ultrasound of 03/11/2014.  No prior CT or MRI. FINDINGS: Mild motion degradation throughout. Lower chest: Small left pleural effusion. Fluid level in a distal esophagus, including on image 7/ series 3. Hepatobiliary: Normal liver. Normal gallbladder. No intra or extrahepatic biliary duct dilatation. Normal common duct, 5 mm on image 44/series 4. Periampullary duodenal diverticulum. No evidence of obstructive mass or stone. Pancreas: Mild pancreatic atrophy, without mass or duct  dilatation, given mild motion degradation. Spleen: Normal in size, without focal abnormality. Adrenals/Urinary Tract: Normal adrenal glands. Normal left kidney. Posterior interpolar right renal cyst of 11 mm. Anterior interpolar right renal lesion demonstrates T2 hypointensity on image 27/ series 3 and measures 1.7 x 1.6 cm on image 47 of series 79024. Precontrast hypointense relative to the remainder of the kidney, with mild post-contrast enhancement, including on image 51/1105. No hydronephrosis. Stomach/Bowel: Normal stomach and abdominal bowel loops. Vascular/Lymphatic: Patent right renal vein. Aortic and branch vessel atherosclerosis. No retroperitoneal or retrocrural adenopathy. Other: No ascites. Musculoskeletal: No acute osseous abnormality. IMPRESSION: 1. Motion degraded exam. 2. No explanation for weight loss. Normal appearance of the liver and biliary tree. 3. 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 Lipid poor angiomyolipoma could look similar. 4. Trace left pleural fluid. 5. Esophageal air fluid level suggests dysmotility or gastroesophageal reflux. These results will be called to the ordering clinician or representative by the Radiologist Assistant, and communication documented in the PACS or zVision Dashboard. Electronically Signed   By: Jeronimo Greaves M.D.   On: 10/02/2015 11:48   Dg Chest Port 1 View  10/18/2015  CLINICAL DATA:  75 year old presenting with acute encephalopathy, hyperglycemia and generalized weakness which began earlier today. EXAM: PORTABLE CHEST 1 VIEW COMPARISON:  09/27/2015 and earlier. FINDINGS: Cardiac silhouette mildly to moderately enlarged, unchanged. Chronic elevation of the left hemidiaphragm. Linear scarring at the left lung base, unchanged. Patchy airspace opacities at the right lung base, new since the examination 2-3 weeks ago. No visible pleural effusions. IMPRESSION: 1. Patchy bronchopneumonia involving the right  lung base. 2. Stable cardiomegaly without pulmonary edema. Electronically Signed   By: Hulan Saas M.D.   On: 10/18/2015 22:17   Mr Abd W/wo Cm/mrcp  10/02/2015  CLINICAL DATA:  Weight loss. Hypertension. Hyperlipidemia. CEA and CA 19 elevation. Chronic kidney disease. EXAM: MRI ABDOMEN WITHOUT AND WITH CONTRAST (INCLUDING MRCP) TECHNIQUE: Multiplanar multisequence MR imaging of the abdomen was performed both before and after the administration of intravenous contrast. Heavily T2-weighted images of the biliary and pancreatic ducts were obtained, and three-dimensional MRCP images were rendered by post processing. CONTRAST:  82mL MULTIHANCE GADOBENATE DIMEGLUMINE 529 MG/ML IV SOLN COMPARISON:  Aortic ultrasound of 03/11/2014.  No prior CT or MRI.  FINDINGS: Mild motion degradation throughout. Lower chest: Small left pleural effusion. Fluid level in a distal esophagus, including on image 7/ series 3. Hepatobiliary: Normal liver. Normal gallbladder. No intra or extrahepatic biliary duct dilatation. Normal common duct, 5 mm on image 44/series 4. Periampullary duodenal diverticulum. No evidence of obstructive mass or stone. Pancreas: Mild pancreatic atrophy, without mass or duct dilatation, given mild motion degradation. Spleen: Normal in size, without focal abnormality. Adrenals/Urinary Tract: Normal adrenal glands. Normal left kidney. Posterior interpolar right renal cyst of 11 mm. Anterior interpolar right renal lesion demonstrates T2 hypointensity on image 27/ series 3 and measures 1.7 x 1.6 cm on image 47 of series [address]. Precontrast hypointense relative to the remainder of the kidney, with mild post-contrast enhancement, including on image 51/1105. No hydronephrosis. Stomach/Bowel: Normal stomach and abdominal bowel loops. Vascular/Lymphatic: Patent right renal vein. Aortic and branch vessel atherosclerosis. No retroperitoneal or retrocrural adenopathy. Other: No ascites. Musculoskeletal: No acute osseous  abnormality. IMPRESSION: 1. Motion degraded exam. 2. No explanation for weight loss. Normal appearance of the liver and biliary tree. 3. 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 Lipid poor angiomyolipoma could look similar. 4. Trace left pleural fluid. 5. Esophageal air fluid level suggests dysmotility or gastroesophageal reflux. These results will be called to the ordering clinician or representative by the Radiologist Assistant, and communication documented in the PACS or zVision Dashboard. Electronically Signed   By: Jeronimo Greaves M.D.   On: 10/02/2015 11:48    Microbiology: Recent Results (from the past 240 hour(s))  Blood Culture (routine x 2)     Status: None (Preliminary result)   Collection Time: 10/18/15  8:54 PM  Result Value Ref Range Status   Specimen Description BLOOD RIGHT HAND  Final   Special Requests IN PEDIATRIC BOTTLE 3CC  Final   Culture   Final    NO GROWTH 1 DAY Performed at [facility]    Report Status PENDING  Incomplete  Blood Culture (routine x 2)     Status: None (Preliminary result)   Collection Time: 10/18/15  8:55 PM  Result Value Ref Range Status   Specimen Description BLOOD RIGHT HAND  Final   Special Requests IN PEDIATRIC BOTTLE 2CC  Final   Culture   Final    NO GROWTH 1 DAY Performed at [facility]    Report Status PENDING  Incomplete  MRSA PCR Screening     Status: None   Collection Time: 10/19/15  5:26 AM  Result Value Ref Range Status   MRSA by PCR NEGATIVE NEGATIVE Final    Comment:        The GeneXpert MRSA Assay (FDA approved for NASAL specimens only), is one component of a comprehensive MRSA colonization surveillance program. It is not intended to diagnose MRSA infection nor to guide or monitor treatment for MRSA infections.      Labs: Basic Metabolic Panel:  Recent Labs Lab 10/18/15 1940 10/19/15 0415 10/19/15 0846 10/21/15 0444  NA 133* 140 136 140  K 4.8  3.8 4.5 4.7  CL 99* 108 108 109  CO2 21* 23 19* 24  GLUCOSE 519* 168* 147* 287*  BUN 44* 33* 30* 16  CREATININE 1.62* 1.22 1.22 1.07  CALCIUM 9.7 8.9 8.9 9.6  MG  --  1.9  --   --   PHOS  --  3.8  --   --    Liver Function Tests:  Recent Labs Lab 10/18/15 1940 10/19/15  0415  AST 32 22  ALT 40 31  ALKPHOS 100 75  BILITOT 0.4 0.4  PROT 7.2 5.7*  ALBUMIN 4.1 3.0*   No results for input(s): LIPASE, AMYLASE in the last 168 hours. No results for input(s): AMMONIA in the last 168 hours. CBC:  Recent Labs Lab 10/18/15 1940 10/19/15 0415 10/21/15 0444  WBC 6.6 5.6 5.2  NEUTROABS 4.7  --   --   HGB 10.2* 8.7* 9.9*  HCT 30.0* 26.2* 28.8*  MCV 91.7 91.6 92.9  PLT 258 214 201   Cardiac Enzymes:  Recent Labs Lab 10/19/15 0415 10/19/15 0846 10/19/15 1445  TROPONINI <0.03 <0.03 <0.03   BNP: BNP (last 3 results)  Recent Labs  10/20/15 1043  BNP 280.3*    ProBNP (last 3 results) No results for input(s): PROBNP in the last 8760 hours.  CBG:  Recent Labs Lab 10/20/15 1117 10/20/15 1622 10/20/15 2048 10/21/15 0724 10/21/15 1142  GLUCAP 77 156* 248* 243* 374*       Signed:  Kaydi Kley K  [facility] 10/21/2015, 1:52 PM

## 2015-10-21 NOTE — [place] FL2 (Signed)
[place] MEDICAID FL2 LEVEL OF CARE SCREENING TOOL     IDENTIFICATION  Patient Name: Benjamin Hodge Birthdate: 01-Oct-1940 Sex: male Admission Date (Current Location): 10/18/2015  [facility] and [place] Number:  Producer, television/film/video and Address:  The . [facility], [address] [place], [place] [address]      Provider Number: [id number]  Attending Physician Name and Address:  Hollice Espy, MD  Relative Name and Phone Number:       Current Level of Care: Hospital Recommended Level of Care: Assisted Living Facility Prior Approval Number:    Date Approved/Denied:   PASRR Number:    Discharge Plan: Domiciliary (Rest home)    Current Diagnoses: Patient Active Problem List   Diagnosis Date Noted  . Hyperglycemia 10/18/2015  . Hyperglycemic crisis in diabetes mellitus (HCC) 10/18/2015  . Aspiration pneumonia (HCC) 10/18/2015  . Acute encephalopathy   . Acute hyperglycemia   . Diarrhea 10/16/2015  . Renal mass, right: Incentendal finding MRI 10/02/2015 10/03/2015  . Hypogonadism in male 10/03/2015  . Dehydration   . Hyperglycemia due to type 1 diabetes mellitus (HCC) 10/02/2015  . Encephalopathy, metabolic   . Heme positive stool 10/01/2015  . Elevated CEA 09/30/2015  . Elevated CA 19-9 level 09/30/2015  . CKD (chronic kidney disease), stage II 09/30/2015  . COPD with exacerbation (HCC) 09/27/2015  . Narcotic dependency, continuous (HCC) 09/27/2015  . AKI (acute kidney injury) (HCC) 09/27/2015  . DKA (diabetic ketoacidosis) (HCC) 09/14/2015  . Narcotic abuse   . Hyperglycemia without ketosis 09/13/2015  . DKA (diabetic ketoacidoses) (HCC) 09/06/2015  . Partial symptomatic epilepsy with complex partial seizures, not intractable, without status epilepticus (HCC) 07/16/2015  . Dementia arising in the senium and presenium 07/16/2015  . Hypersomnia with long sleep time, idiopathic 07/16/2015  . Pruritus of scalp 05/04/2015  . Loose stools  05/04/2015  . Absence attack (HCC) 05/04/2015  . B12 deficiency 05/04/2015  . Seizures (HCC) 04/10/2015  . Cerebrovascular disease 04/10/2015  . PVD (peripheral vascular disease) (HCC) 02/18/2015  . Testosterone deficiency 01/02/2015  . 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 Right-sided chest wall pain 11/27/2014  . Ecchymosis 08/29/2014  . Anemia- chronic 04/03/2014  . CAD- moderate3V disease at cath 7/15- medical Rx 03/28/2014  . Dyslipidemia 03/13/2014  . Non-STEMI (non-ST elevated myocardial infarction) (HCC) 03/10/2014  . 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 Low back pain potentially associated with spinal stenosis 02/26/2014  . ADHD, adult residual type 05/31/2013  . Elevated AST (SGOT) 01/23/2013  . SHOULDER PAIN, LEFT 03/04/2009  . ANXIETY 07/18/2008  . IRRITABLE BOWEL SYNDROME 04/27/2008  . NOCTURIA 03/13/2008  . COPD (chronic obstructive pulmonary disease) (HCC) 02/27/2008  . Mild nonproliferative diabetic retinopathy (HCC) 11/18/2007  . LEG PAIN, RIGHT 09/29/2007  . POLYNEUROPATHY IN DIABETES 02/09/2007  . HYPOTENSION, ORTHOSTATIC 02/09/2007  . Hypothyroidism 09/27/2006  . Type 1 diabetes mellitus with neurological manifestations, uncontrolled (HCC) 09/27/2006  . ERECTILE DYSFUNCTION 09/27/2006  . Essential hypertension 09/27/2006  . SPINAL STENOSIS, CERVICAL 09/27/2006  . Hypersomnia 09/27/2006    Orientation RESPIRATION BLADDER Height & Weight     Self, Time, Situation, Place  Normal Continent Weight: 138 lb 14.2 oz (63 kg) Height:   (167.6 cm)  BEHAVIORAL SYMPTOMS/MOOD NEUROLOGICAL BOWEL NUTRITION STATUS   (NONE)  (NONE) Continent Diet (HEART HEATHY/CARB MODIFIED)  AMBULATORY STATUS COMMUNICATION OF NEEDS Skin   Independent Verbally Normal  Personal Care Assistance Level of Assistance  Bathing, Dressing, Feeding Bathing Assistance: Limited  assistance Feeding assistance: Independent Dressing Assistance: Limited assistance     Functional Limitations Info  Sight, Hearing, Speech Sight Info: Adequate Hearing Info: Adequate Speech Info: Adequate    SPECIAL CARE FACTORS FREQUENCY  PT (By licensed PT), OT (By licensed OT)     PT Frequency: 3 OT Frequency: 2            Contractures      Additional Factors Info  Code Status, Allergies Code Status Info:  (FULL CODE) Allergies Info:  (Ace Inhibitors, Cymbalta, Lipitor, Bee Venom, Glucerna, Angiotensin Receptor Blockers)   Insulin Sliding Scale Info: 3 X a day       Current Medications (10/21/2015):  This is the current hospital active medication list Current Facility-Administered Medications  Medication Dose Route Frequency Provider Last Rate Last Dose  . 0.9 %  sodium chloride infusion   Intravenous Continuous Hollice Espy, MD   Stopped at 10/19/15 [phone]  . acetaminophen (TYLENOL) tablet 650 mg  650 mg Oral Q6H PRN Therisa Doyne, MD       Or  . acetaminophen (TYLENOL) suppository 650 mg  650 mg Rectal Q6H PRN Therisa Doyne, MD      . albuterol (PROVENTIL) (2.5 MG/3ML) 0.083% nebulizer solution 2.5 mg  2.5 mg Nebulization Q2H PRN Therisa Doyne, MD      . aspirin chewable tablet 81 mg  81 mg Oral QHS Therisa Doyne, MD   81 mg at 10/20/15 2239  . budesonide-formoterol (SYMBICORT) 160-4.5 MCG/ACT inhaler 2 puff  2 puff Inhalation BID Therisa Doyne, MD   2 puff at 10/21/15 0911  . clopidogrel (PLAVIX) tablet 75 mg  75 mg Oral Q breakfast Therisa Doyne, MD   75 mg at 10/21/15 0825  . dextrose 50 % solution 25 mL  25 mL Intravenous PRN Therisa Doyne, MD      . enoxaparin (LOVENOX) injection 40 mg  40 mg Subcutaneous Q24H Hollice Espy, MD   40 mg at 10/21/15 1126  . escitalopram (LEXAPRO) tablet 20 mg  20 mg Oral Daily Therisa Doyne, MD   20 mg at 10/21/15 1123  . guaiFENesin (MUCINEX) 12 hr tablet 600 mg  600 mg Oral BID  Therisa Doyne, MD   600 mg at 10/21/15 1124  . HYDROcodone-acetaminophen (NORCO/VICODIN) 5-325 MG per tablet 1-2 tablet  1-2 tablet Oral Q4H PRN Therisa Doyne, MD   1 tablet at 10/19/15 0901  . insulin aspart (novoLOG) injection 0-5 Units  0-5 Units Subcutaneous QHS Hollice Espy, MD      . insulin aspart (novoLOG) injection 0-9 Units  0-9 Units Subcutaneous TID WC Hollice Espy, MD      . insulin aspart (novoLOG) injection 3 Units  3 Units Subcutaneous TID WC Hollice Espy, MD      . insulin glargine (LANTUS) injection 13 Units  13 Units Subcutaneous Daily Hollice Espy, MD   13 Units at 10/21/15 1126  . levETIRAcetam (KEPPRA) tablet 750 mg  750 mg Oral BID Therisa Doyne, MD   750 mg at 10/21/15 1125  . [START ON 10/22/2015] levofloxacin (LEVAQUIN) tablet 500 mg  500 mg Oral Q48H Crystal S Robertson, RPH      . levothyroxine (SYNTHROID, LEVOTHROID) tablet 75 mcg  75 mcg Oral QAC breakfast Therisa Doyne, MD   75 mcg at 10/21/15 0826  . LORazepam (ATIVAN) injection 1-2 mg  1-2 mg Intravenous Q2H PRN Therisa Doyne, MD      .  methocarbamol (ROBAXIN) tablet 500 mg  500 mg Oral 6 times per day Therisa Doyne, MD   500 mg at 10/21/15 1306  . metoprolol tartrate (LOPRESSOR) tablet 12.5 mg  12.5 mg Oral BID Therisa Doyne, MD   12.5 mg at 10/21/15 1125  . ondansetron (ZOFRAN) tablet 4 mg  4 mg Oral Q6H PRN Therisa Doyne, MD       Or  . ondansetron (ZOFRAN) injection 4 mg  4 mg Intravenous Q6H PRN Therisa Doyne, MD      . pantoprazole (PROTONIX) EC tablet 40 mg  40 mg Oral Daily Therisa Doyne, MD   40 mg at 10/21/15 1124  . pravastatin (PRAVACHOL) tablet 20 mg  20 mg Oral QPM Therisa Doyne, MD   20 mg at 10/20/15 1742  . pregabalin (LYRICA) capsule 100 mg  100 mg Oral QHS Therisa Doyne, MD   100 mg at 10/20/15 2239  . pregabalin (LYRICA) capsule 50 mg  50 mg Oral Daily Therisa Doyne, MD   50 mg at 10/21/15 1125  . saccharomyces  boulardii (FLORASTOR) capsule 250 mg  250 mg Oral QPM Therisa Doyne, MD   250 mg at 10/20/15 1742  . sodium chloride flush (NS) 0.9 % injection 3 mL  3 mL Intravenous Q12H Therisa Doyne, MD   3 mL at 10/21/15 1126     Discharge Medications: Please see discharge summary for a list of discharge medications.  Relevant Imaging Results:  Relevant Lab Results:   Additional Information SSN [id number]  Derenda Fennel, MSW, LCSWA [phone] 10/21/2015 3:29 PM

## 2015-10-21 NOTE — Clinical Social Work Note (Addendum)
Patient's dtr-in-law continues to express concerns in regards to insulin instructions. Pt's dtr-in-law requesting to review FL-2. CSW explained FL-2 process and that information (medications) is generated into FL-2. CSW reported that pt's dtr-in-law will receive copy of AVS with medications included. CSW has advised dtr-in-law to discuss concerns with RN and request consent forms to obtain document from [facility] medical records.  Facility has NOT expressed concerns at this time and stated they are prepared to admit patient today. CSW encouraged facility to contact CSW as needed or with further questions if any. Pt's dtr-in-law to transport patient to ALF, [facility].   Clinical Social Worker will sign off for now as social work intervention is no longer needed. Please consult [place] again if new need arises. Please contact MD or RN for further medical concerns regards insulin instructions.   Derenda Fennel, MSW, LCSWA [phone] 10/21/2015 4:44 PM

## 2015-10-22 ENCOUNTER — Ambulatory Visit: Payer: Self-pay | Admitting: Neurology

## 2015-10-22 ENCOUNTER — Encounter (HOSPITAL_COMMUNITY): Payer: Self-pay

## 2015-10-22 ENCOUNTER — Emergency Department (HOSPITAL_COMMUNITY)
Admission: EM | Admit: 2015-10-22 | Discharge: 2015-10-22 | Disposition: A | Discharge status: Home / Self Care | Payer: Medicare Other | Attending: Emergency Medicine | Admitting: Emergency Medicine

## 2015-10-22 ENCOUNTER — Telehealth: Payer: Self-pay

## 2015-10-22 DIAGNOSIS — I129 Hypertensive chronic kidney disease with stage 1 through stage 4 chronic kidney disease, or unspecified chronic kidney disease: Secondary | ICD-10-CM | POA: Insufficient documentation

## 2015-10-22 DIAGNOSIS — Z7951 Long term (current) use of inhaled steroids: Secondary | ICD-10-CM | POA: Insufficient documentation

## 2015-10-22 DIAGNOSIS — R197 Diarrhea, unspecified: Secondary | ICD-10-CM | POA: Insufficient documentation

## 2015-10-22 DIAGNOSIS — K589 Irritable bowel syndrome without diarrhea: Secondary | ICD-10-CM | POA: Insufficient documentation

## 2015-10-22 DIAGNOSIS — E039 Hypothyroidism, unspecified: Secondary | ICD-10-CM | POA: Diagnosis not present

## 2015-10-22 DIAGNOSIS — Z87891 Personal history of nicotine dependence: Secondary | ICD-10-CM | POA: Diagnosis not present

## 2015-10-22 DIAGNOSIS — Z794 Long term (current) use of insulin: Secondary | ICD-10-CM | POA: Insufficient documentation

## 2015-10-22 DIAGNOSIS — Z8701 Personal history of pneumonia (recurrent): Secondary | ICD-10-CM | POA: Diagnosis not present

## 2015-10-22 DIAGNOSIS — Z87438 Personal history of other diseases of male genital organs: Secondary | ICD-10-CM | POA: Insufficient documentation

## 2015-10-22 DIAGNOSIS — R569 Unspecified convulsions: Secondary | ICD-10-CM | POA: Insufficient documentation

## 2015-10-22 DIAGNOSIS — E538 Deficiency of other specified B group vitamins: Secondary | ICD-10-CM | POA: Insufficient documentation

## 2015-10-22 DIAGNOSIS — I252 Old myocardial infarction: Secondary | ICD-10-CM | POA: Insufficient documentation

## 2015-10-22 DIAGNOSIS — J449 Chronic obstructive pulmonary disease, unspecified: Secondary | ICD-10-CM | POA: Insufficient documentation

## 2015-10-22 DIAGNOSIS — G40909 Epilepsy, unspecified, not intractable, without status epilepticus: Secondary | ICD-10-CM

## 2015-10-22 DIAGNOSIS — Z7901 Long term (current) use of anticoagulants: Secondary | ICD-10-CM | POA: Diagnosis not present

## 2015-10-22 DIAGNOSIS — F329 Major depressive disorder, single episode, unspecified: Secondary | ICD-10-CM | POA: Diagnosis not present

## 2015-10-22 DIAGNOSIS — F419 Anxiety disorder, unspecified: Secondary | ICD-10-CM | POA: Insufficient documentation

## 2015-10-22 DIAGNOSIS — Z9889 Other specified postprocedural states: Secondary | ICD-10-CM | POA: Insufficient documentation

## 2015-10-22 DIAGNOSIS — N189 Chronic kidney disease, unspecified: Secondary | ICD-10-CM | POA: Insufficient documentation

## 2015-10-22 DIAGNOSIS — Z79899 Other long term (current) drug therapy: Secondary | ICD-10-CM | POA: Diagnosis not present

## 2015-10-22 DIAGNOSIS — Z7982 Long term (current) use of aspirin: Secondary | ICD-10-CM | POA: Insufficient documentation

## 2015-10-22 DIAGNOSIS — E119 Type 2 diabetes mellitus without complications: Secondary | ICD-10-CM | POA: Diagnosis not present

## 2015-10-22 DIAGNOSIS — D649 Anemia, unspecified: Secondary | ICD-10-CM | POA: Insufficient documentation

## 2015-10-22 DIAGNOSIS — G8929 Other chronic pain: Secondary | ICD-10-CM | POA: Diagnosis not present

## 2015-10-22 DIAGNOSIS — I10 Essential (primary) hypertension: Secondary | ICD-10-CM | POA: Diagnosis not present

## 2015-10-22 DIAGNOSIS — E785 Hyperlipidemia, unspecified: Secondary | ICD-10-CM | POA: Diagnosis not present

## 2015-10-22 DIAGNOSIS — I2510 Atherosclerotic heart disease of native coronary artery without angina pectoris: Secondary | ICD-10-CM | POA: Diagnosis not present

## 2015-10-22 LAB — CBC WITH DIFFERENTIAL/PLATELET
Basophils Absolute: 0 10*3/uL (ref 0.0–0.1)
Basophils Relative: 0 %
Eosinophils Absolute: 0.2 10*3/uL (ref 0.0–0.7)
Eosinophils Relative: 3 %
HCT: 30.8 % — ABNORMAL LOW (ref 39.0–52.0)
Hemoglobin: 10.2 g/dL — ABNORMAL LOW (ref 13.0–17.0)
Lymphocytes Relative: 18 %
Lymphs Abs: 1.3 10*3/uL (ref 0.7–4.0)
MCH: 31.7 pg (ref 26.0–34.0)
MCHC: 33.1 g/dL (ref 30.0–36.0)
MCV: 95.7 fL (ref 78.0–100.0)
Monocytes Absolute: 0.6 10*3/uL (ref 0.1–1.0)
Monocytes Relative: 8 %
Neutro Abs: 5.4 10*3/uL (ref 1.7–7.7)
Neutrophils Relative %: 71 %
Platelets: 209 10*3/uL (ref 150–400)
RBC: 3.22 MIL/uL — ABNORMAL LOW (ref 4.22–5.81)
RDW: 13.1 % (ref 11.5–15.5)
WBC: 7.6 10*3/uL (ref 4.0–10.5)

## 2015-10-22 LAB — BASIC METABOLIC PANEL
Anion gap: 13 (ref 5–15)
BUN: 27 mg/dL — ABNORMAL HIGH (ref 6–20)
CO2: 23 mmol/L (ref 22–32)
Calcium: 9.6 mg/dL (ref 8.9–10.3)
Chloride: 102 mmol/L (ref 101–111)
Creatinine, Ser: 1.81 mg/dL — ABNORMAL HIGH (ref 0.61–1.24)
GFR calc Af Amer: 41 mL/min — ABNORMAL LOW (ref >60)
GFR calc non Af Amer: 36 mL/min — ABNORMAL LOW (ref >60)
Glucose, Bld: 397 mg/dL — ABNORMAL HIGH (ref 65–99)
Potassium: 4.8 mmol/L (ref 3.5–5.1)
Sodium: 138 mmol/L (ref 135–145)

## 2015-10-22 LAB — CBG MONITORING, ED: GLUCOSE-CAPILLARY: 131 mg/dL — AB (ref 65–99)

## 2015-10-22 LAB — URINALYSIS, ROUTINE W REFLEX MICROSCOPIC
Bilirubin Urine: NEGATIVE
Glucose, UA: 500 mg/dL — AB
Hgb urine dipstick: NEGATIVE
Ketones, ur: NEGATIVE mg/dL
Leukocytes, UA: NEGATIVE
Nitrite: NEGATIVE
Protein, ur: NEGATIVE mg/dL
Specific Gravity, Urine: 1.015 (ref 1.005–1.030)
pH: 5 (ref 5.0–8.0)

## 2015-10-22 MED ORDER — LEVETIRACETAM 1000 MG PO TABS: 1000.0000 mg | ORAL_TABLET | Freq: Two times a day (BID) | ORAL | Status: AC

## 2015-10-22 NOTE — ED Notes (Signed)
Patient here to be evaluated for seizures x 2 today prior to arrival. Just discharged from hospital yesterday for same. Patient oriented to person and place

## 2015-10-22 NOTE — Telephone Encounter (Signed)
Pt arrived over 30 minutes later for his appointment with Dr. Vickey Huger because he had a seizure this morning. Dr. Vickey Huger advised the front desk that pt needed to go to the ER to be seen, she cannot see him here.

## 2015-10-22 NOTE — Discharge Instructions (Signed)
Return here as needed.  Follow-up with your neurologist.  We have increased the dosage of your seizure medication

## 2015-10-22 NOTE — Consult Note (Signed)
NEURO HOSPITALIST CONSULT NOTE   Requestig physician: Dr. Gardiner Rhyme   Reason for Consult: seizure  HPI:                                                                                                                                          Benjamin Hodge is an 75 y.o. male recently seen in hospital . He has  hx of multiple medical problems including seizures and DM. At that time he  presented with breakthrough seizures in the setting of hyperglycemia. Patient was discharged from NH to family on Feb 3, they report since then he has had labile blood sugars and breakthrough seizures. Moved to ALF on 12/09 but to inability to manage blood sugars he was brought home. While at home had another breakthrough seizure and was subsequently post-ictal. Family reports being unsure if he is truly taking his seizure medication. CT head was negative.  He was placed on 750 mg Keppra and had no recurrent seizure while in hospital. EEG showed no epileptiform activity. Discharged on the 13th. Per ED chart "The patient was sent home to his assisted living facility. Patient states that he did not get much sleep last night. He woke up this morning and as his daughter in law was coming to get him she states that he had two petit mal seizures while they were getting ready to leave. The first event happened as the patient was walking out of the bathroom. Daughter-in-law states that he grabbed on to the door and began to squat down and became unresponsive. Patient had diarrhea this morning. Nurses were present and state that his eyes were rolled into the back of his head and he became stiff for about a minute. They sat him down in a chair and his arms and wrists curled up tightly against his body and his legs stuck out straight in front of him. After the patient regained consciousness he was not post-ictal like he usually is. The patient ate crackers and drank fluids and was getting ready to leave to go to his  appointment with his neurologist, when daughter-in-law reports that he had another seizure. This time the patient grabbed onto the hand rail at the hallway, began to squat down and had the same seizure like activity as previously reported. Patient does not recall any of the events before or directly after having the seizure and denies that he has seizures because he states that he cannot remember them. "  Past Medical History  Diagnosis Date  . CAD (coronary artery disease)     LAD 40-50% stenosis, first diagonal small 90% stenosis, circumflex 70% stenosis, OM 80% stenosis, right coronary artery 80-90% stenosis.  . Right upper lobe pneumonia 11/28/2008  . Anxiety 07/18/2008  . Irritable bowel syndrome 04/27/2008  .  COPD (chronic obstructive pulmonary disease) (HCC) 02/27/2008  . B12 deficiency 02/16/2008    in 5/09: B12 262, MMA 1040  . Chronic diarrhea 01/01/2008    s/p EGD 10/06: H pylori + gastritis, duodenal biopsy normal (Dr. Elnoria Howard); s/p colonoscopy 10/06: hemorrhoids (Dr. Elnoria Howard); evaluated by Dr. Yancey Flemings  in 8/09: tissue transglutaminase AB negative, VIP normal, stool fat content normal, urine 5-HIAA normal; normal GES 11/09 (done for "fluctuating sugars")  . 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 Orthostatic hypotension 02/09/2007  . Hypertension 02/09/2007  . Spinal stenosis in cervical region 05/03    MRI  . Erectile dysfunction 09/27/2006    s/p penile implant  . Anemia, iron deficiency 09/27/2006    neg colonoscopy 2006 - Dr. Elnoria Howard; ferritin 152; hgb  15.7  on 07/07  . Hypothyroidism 09/27/2006    TSH 2.639  07/07  . Hypersomnia 09/27/2006    evaluated by Dr. Jetty Duhamel (5/08) and Dr. Vickey Huger; PSG 8/09: chronic delayed sleep phase syndrome (patient sleeps during the day and is awake at night), nocturnal myoclonus (eval for RLS and IDA suggested). Pt advised to change sleeping behavior  . Diabetes mellitus type II 09/07/1984    poorly controlled,  complicated by peripheral neuropathy, microalbuminuria, mild non proliferative retinopathy, s/p DKA 7/05, on insulin pump x 4/09, started a pump vacation on 11/19/2008  . Hyperlipidemia   . 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 Hemorrhoids   . Chronic kidney disease   . Unspecified hereditary and idiopathic peripheral neuropathy   . Other chronic pain   . Other malaise and fatigue   . Depression   . Hypertrophy of prostate without urinary obstruction and other lower urinary tract symptoms (LUTS)   . Lumbago   . Heart attack (HCC) 03/2014    mild  . Renal mass, right: Incentendal finding MRI 10/02/2015 10/03/2015  . Hypogonadism in male 10/03/2015    Past Surgical History  Procedure Laterality Date  . Penile prosthesis implant    . Tonsillectomy    . Colonoscopy  06/25/2005    Dr. Jeani Hawking  . Left heart catheterization with coronary angiogram [name] 03/13/2014    Procedure: LEFT HEART CATHETERIZATION WITH CORONARY ANGIOGRAM;  Surgeon: Peter M Swaziland, MD;  Location: [facility] CATH LAB;  Service: Cardiovascular;  Laterality: [name];  . Peripheral vascular catheterization [name] 02/19/2015    Procedure: Abdominal Aortogram;  Surgeon: Nada Libman, MD;  Location: [facility] INVASIVE CV LAB;  Service: Cardiovascular;  Laterality: [name];  . Peripheral vascular catheterization Left 02/19/2015    Procedure: Lower Extremity Angiography;  Surgeon: Nada Libman, MD;  Location: [facility] INVASIVE CV LAB;  Service: Cardiovascular;  Laterality: Left;    Family History  Problem Relation Age of Onset  . Bone cancer Mother   . Breast cancer Mother   . Cancer Mother   . Lung cancer Father   . Cancer Father   . Breast cancer Sister   . Cancer Sister   . Lung cancer Brother   . Cancer Sister     type unknown    Social History:  reports that he quit smoking about 11 years ago. His smoking use included Cigarettes. He has never used smokeless tobacco. He reports that he drinks alcohol. He reports that he does not use illicit  drugs.  Allergies  Allergen Reactions  . Ace Inhibitors Other (See Comments)    dizzy  . Cymbalta [Duloxetine Hcl] Other (See Comments)    dizzy  . Lipitor [Atorvastatin] Other (See Comments)  Caused pain all over body.  . Bee Venom Swelling  . Glucerna [Nutritional Supplements] Diarrhea  . Angiotensin Receptor Blockers Other (See Comments)    unknown    MEDICATIONS:                                                                                                                     No current facility-administered medications for this encounter.   Current Outpatient Prescriptions  Medication Sig Dispense Refill  . aspirin 81 MG tablet Take 81 mg by mouth at bedtime.     . clopidogrel (PLAVIX) 75 MG tablet TAKE 1 TABLET BY MOUTH DAILY WITH BREAKFAST 30 tablet 3  . escitalopram (LEXAPRO) 20 MG tablet TAKE ONE TABLET BY MOUTH ONE TIME DAILY 30 tablet 5  . glucose blood (ONE TOUCH TEST STRIPS) test strip 1 each by Other route 4 (four) times daily. Use as instructed    . levETIRAcetam (KEPPRA) 750 MG tablet Take 750 mg by mouth 2 (two) times daily. For seizures    . levothyroxine (SYNTHROID, LEVOTHROID) 75 MCG tablet Take 1 tablet (75 mcg total) by mouth daily. 30 tablet 5  . metoprolol tartrate (LOPRESSOR) 25 MG tablet Take One tablet by mouth twice daily to control BP 180 tablet 0  . pregabalin (LYRICA) 50 MG capsule Take one capsule by mouth in the morning and two capsules by mouth in the evening for pains. 90 capsule 5  . saccharomyces boulardii (FLORASTOR) 250 MG capsule Take 1 capsule (250 mg total) by mouth every evening. 60 capsule 0  . budesonide-formoterol (SYMBICORT) 160-4.5 MCG/ACT inhaler Inhale 2 puffs into the lungs 2 (two) times daily. 1 Inhaler 6  . diphenoxylate-atropine (LOMOTIL) 2.5-0.025 MG tablet One up to 4 times daily if needed for diarrhea 50 tablet 4  . HYDROcodone-acetaminophen (NORCO/VICODIN) 5-325 MG tablet Take 1 tablet by mouth every 6 (six) hours as needed  for moderate pain or severe pain.    Marland Kitchen insulin aspart (NOVOLOG) 100 UNIT/ML injection Inject 3 Units into the skin 3 (three) times daily with meals. 10 mL 11  . insulin aspart (NOVOLOG) 100 UNIT/ML injection Sliding Scale: If BS 70-159=4 units, 160-215=5 units, 220-279=6 units, 280-339=7 units, 340-399=8 units. If greater than 400 give 9 units 10 mL 11  . insulin aspart (NOVOLOG) 100 UNIT/ML injection Sliding Scale: If BS 70-199=0 units, 200-249=2 units, 250-299=3 units, 300-349=4 units, 350-399=5 units. If greater than 400 give 6 units 10 mL 11  . Insulin Glargine (TOUJEO SOLOSTAR) 300 UNIT/ML SOPN Inject 11 Units into the skin at bedtime.     Marland Kitchen levofloxacin (LEVAQUIN) 500 MG tablet Take 1 tablet (500 mg total) by mouth every other day. 2 tablet 0  . LORazepam (ATIVAN) 2 MG/ML injection Inject 0.5 mg into the muscle every 6 (six) hours as needed for anxiety or seizure.    . methocarbamol (ROBAXIN) 500 MG tablet Take 500 mg by mouth every 6 (six) hours as needed for muscle spasms.     . Multiple Vitamin (MULTIVITAMIN  WITH MINERALS) TABS tablet Take 1 tablet by mouth daily. 30 tablet 3  . pantoprazole (PROTONIX) 40 MG tablet TAKE ONE TABLET BY MOUTH ONE TIME DAILY 30 tablet 5  . pravastatin (PRAVACHOL) 20 MG tablet Take 1 tablet (20 mg total) by mouth every evening. NEEDS APPOINTMENT FOR FUTURE REFILLS OR 90-DAY SUPPLY (Patient taking differently: Take 20 mg by mouth daily. ) 30 tablet 0  . tiotropium (SPIRIVA) 18 MCG inhalation capsule Place 1 capsule (18 mcg total) into inhaler and inhale daily. 30 capsule 6  . vitamin B-12 (CYANOCOBALAMIN) 1000 MCG tablet Take 1,000 mcg by mouth daily.        ROS:                                                                                                                                       History obtained from the patient  General ROS: negative for - chills, fatigue, fever, night sweats, weight gain or weight loss Psychological ROS: negative for -  behavioral disorder, hallucinations, memory difficulties, mood swings or suicidal ideation Ophthalmic ROS: negative for - blurry vision, double vision, eye pain or loss of vision ENT ROS: negative for - epistaxis, nasal discharge, oral lesions, sore throat, tinnitus or vertigo Allergy and Immunology ROS: negative for - hives or itchy/watery eyes Hematological and Lymphatic ROS: negative for - bleeding problems, bruising or swollen lymph nodes Endocrine ROS: negative for - galactorrhea, hair pattern changes, polydipsia/polyuria or temperature intolerance Respiratory ROS: negative for - cough, hemoptysis, shortness of breath or wheezing Cardiovascular ROS: negative for - chest pain, dyspnea on exertion, edema or irregular heartbeat Gastrointestinal ROS: negative for - abdominal pain, diarrhea, hematemesis, nausea/vomiting or stool incontinence Genito-Urinary ROS: negative for - dysuria, hematuria, incontinence or urinary frequency/urgency Musculoskeletal ROS: negative for - joint swelling or muscular weakness Neurological ROS: as noted in HPI Dermatological ROS: negative for rash and skin lesion changes   Blood pressure 126/65, pulse 65, temperature 97.3 F (36.3 C), temperature source Oral, resp. rate 16, weight 62.596 kg (138 lb), SpO2 99 %.   Neurologic Examination:                                                                                                      HEENT-  Normocephalic, no lesions, without obvious abnormality.  Normal external eye and conjunctiva.  Normal TM's bilaterally.  Normal auditory canals and external ears. Normal external nose, mucus membranes and septum.  Normal pharynx. Cardiovascular- S1, S2 normal, pulses palpable throughout  Lungs- chest clear, no wheezing, rales, normal symmetric air entry Abdomen- normal findings: bowel sounds normal Extremities- no edema Lymph-no adenopathy palpable Musculoskeletal-no joint tenderness, deformity or swelling Skin-warm  and dry, no hyperpigmentation, vitiligo, or suspicious lesions  Neurological Examination Mental Status: Alert, oriented, thought content appropriate.  Speech fluent without evidence of aphasia.  Able to follow 3 step commands without difficulty. Cranial Nerves: II: sent, extra-ocular motions intact bilaterally V,VII: smile symmetric, facial light touch sensation normal bilaterally VIII: hearing normal bilaterally IX,X: uvula rises symmetrically XI: bilateral shoulder shrug XII: midline tongue extension Motor: Right : Upper extremity   5/5    Left:     Upper extremity   5/5  Lower extremity   5/5     Lower extremity   5/5 Tone and bulk:normal tone throughout; no atrophy noted Sensory: Pinprick and light touch intact throughout, bilaterally Deep Tendon Reflexes: 2+ and symmetric throughout Plantars: Right: downgoing   Left: downgoing Cerebellar: normal finger-to-nose, and normal heel-to-shin test Gait: not tested      Lab Results: Basic Metabolic Panel:  Recent Labs Lab 10/18/15 1940 10/19/15 0415 10/19/15 0846 10/21/15 0444  NA 133* 140 136 140  K 4.8 3.8 4.5 4.7  CL 99* 108 108 109  CO2 21* 23 19* 24  GLUCOSE 519* 168* 147* 287*  BUN 44* 33* 30* 16  CREATININE 1.62* 1.22 1.22 1.07  CALCIUM 9.7 8.9 8.9 9.6  MG  --  1.9  --   --   PHOS  --  3.8  --   --     Liver Function Tests:  Recent Labs Lab 10/18/15 1940 10/19/15 0415  AST 32 22  ALT 40 31  ALKPHOS 100 75  BILITOT 0.4 0.4  PROT 7.2 5.7*  ALBUMIN 4.1 3.0*   No results for input(s): LIPASE, AMYLASE in the last 168 hours. No results for input(s): AMMONIA in the last 168 hours.  CBC:  Recent Labs Lab 10/18/15 1940 10/19/15 0415 10/21/15 0444  WBC 6.6 5.6 5.2  NEUTROABS 4.7  --   --   HGB 10.2* 8.7* 9.9*  HCT 30.0* 26.2* 28.8*  MCV 91.7 91.6 92.9  PLT 258 214 201    Cardiac Enzymes:  Recent Labs Lab 10/19/15 0415 10/19/15 0846 10/19/15 1445  TROPONINI <0.03 <0.03 <0.03    Lipid  Panel: No results for input(s): CHOL, TRIG, HDL, CHOLHDL, VLDL, LDLCALC in the last 168 hours.  CBG:  Recent Labs Lab 10/20/15 2048 10/21/15 0724 10/21/15 1142 10/21/15 1743 10/22/15 1136  GLUCAP 248* 243* 374* 64* 131*    Microbiology: Results for orders placed or performed during the hospital encounter of 10/18/15  Blood Culture (routine x 2)     Status: None (Preliminary result)   Collection Time: 10/18/15  8:54 PM  Result Value Ref Range Status   Specimen Description BLOOD RIGHT HAND  Final   Special Requests IN PEDIATRIC BOTTLE 3CC  Final   Culture   Final    NO GROWTH 2 DAYS Performed at [facility]    Report Status PENDING  Incomplete  Blood Culture (routine x 2)     Status: None (Preliminary result)   Collection Time: 10/18/15  8:55 PM  Result Value Ref Range Status   Specimen Description BLOOD RIGHT HAND  Final   Special Requests IN PEDIATRIC BOTTLE 2CC  Final   Culture   Final    NO GROWTH 2 DAYS Performed at [facility]    Report Status PENDING  Incomplete  MRSA PCR Screening     Status: None   Collection Time: 10/19/15  5:26 AM  Result Value Ref Range Status   MRSA by PCR NEGATIVE NEGATIVE Final    Comment:        The GeneXpert MRSA Assay (FDA approved for NASAL specimens only), is one component of a comprehensive MRSA colonization surveillance program. It is not intended to diagnose MRSA infection nor to guide or monitor treatment for MRSA infections.     Coagulation Studies: No results for input(s): LABPROT, INR in the last 72 hours.  Imaging: No results found.     Assessment and plan per attending neurologist  Felicie Morn PA-C [facility] [phone]  10/22/2015, 2:03 PM   Assessment/Plan: 75 YO male with break through seizure.  Currently on both Keppra 7450 mg BID and Lyrica 50 mg in AM and 100 mg at night.   Recommend: 1) increase Keppra to 1 gram BID 2) follow up with neurology out patient.  3)  likely would benefit from ambulatory EEG

## 2015-10-22 NOTE — ED Provider Notes (Signed)
CSN: [id number]     Arrival date & time 10/22/15  1123 History   First MD Initiated Contact with Patient 10/22/15 1148     Chief Complaint  Patient presents with  . Seizures     (Consider location/radiation/quality/duration/timing/severity/associated sxs/prior Treatment) HPI Patient presents Emergency Department after having two seizures this morning. The patient was discharged from the hospital last night after being admitted for seizures and PNA. Patient has a history of chronic alcohol use, and states that he has recently quit drinking. The patient was sent home to his assisted living facility. Patient states that he did not get much sleep last night. He woke up this morning and as his daughter in law was coming to get him she states that he had two petit mal seizures while they were getting ready to leave. The first event happened as the patient was walking out of the bathroom. Daughter-in-law states that he grabbed on to the door and began to squat down and became unresponsive. Patient had diarrhea this morning. Nurses were present and state that his eyes were rolled into the back of his head and he became stiff for about a minute. They sat him down in a chair and his arms and wrists curled up tightly against his body and his legs stuck out straight in front of him. After the patient regained consciousness he was not post-ictal like he usually is. The patient ate crackers and drank fluids and was getting ready to leave to go to his appointment with his neurologist, when daughter-in-law reports that he had another seizure. This time the patient grabbed onto the hand rail at the hallway, began to squat down and had the same seizure like activity as previously reported. Patient does not recall any of the events before or directly after having the seizure and denies that he has seizures because he states that he cannot remember them. Patient states that he has poorly controlled diabetes and that he has  great difficulty controlling his BG. Patient denies any fevers or chills, nausea, vomiting, abdominal pain, chest pain, SOB, syncope, lightheadedness, dizziness, cough, rhinorrhea, congestion.  Past Medical History  Diagnosis Date  . CAD (coronary artery disease)     LAD 40-50% stenosis, first diagonal small 90% stenosis, circumflex 70% stenosis, OM 80% stenosis, right coronary artery 80-90% stenosis.  . Right upper lobe pneumonia 11/28/2008  . Anxiety 07/18/2008  . Irritable bowel syndrome 04/27/2008  . COPD (chronic obstructive pulmonary disease) (HCC) 02/27/2008  . B12 deficiency 02/16/2008    in 5/09: B12 262, MMA 1040  . Chronic diarrhea 01/01/2008    s/p EGD 10/06: H pylori + gastritis, duodenal biopsy normal (Dr. Elnoria Howard); s/p colonoscopy 10/06: hemorrhoids (Dr. Elnoria Howard); evaluated by Dr. Yancey Flemings  in 8/09: tissue transglutaminase AB negative, VIP normal, stool fat content normal, urine 5-HIAA normal; normal GES 11/09 (done for "fluctuating sugars")  . 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 Orthostatic hypotension 02/09/2007  . Hypertension 02/09/2007  . Spinal stenosis in cervical region 05/03    MRI  . Erectile dysfunction 09/27/2006    s/p penile implant  . Anemia, iron deficiency 09/27/2006    neg colonoscopy 2006 - Dr. Elnoria Howard; ferritin 152; hgb  15.7  on 07/07  . Hypothyroidism 09/27/2006    TSH 2.639  07/07  . Hypersomnia 09/27/2006    evaluated by Dr. Jetty Duhamel (5/08) and Dr. Vickey Huger; PSG 8/09: chronic delayed sleep phase syndrome (patient sleeps during the day and is awake at night), nocturnal myoclonus (  eval for RLS and IDA suggested). Pt advised to change sleeping behavior  . Diabetes mellitus type II 09/07/1984    poorly controlled, complicated by peripheral neuropathy, microalbuminuria, mild non proliferative retinopathy, s/p DKA 7/05, on insulin pump x 4/09, started a pump vacation on 11/19/2008  . Hyperlipidemia   . Hypogonadism male 08/2011   . Hemorrhoids   . Chronic kidney disease   . Unspecified hereditary and idiopathic peripheral neuropathy   . Other chronic pain   . Other malaise and fatigue   . Depression   . Hypertrophy of prostate without urinary obstruction and other lower urinary tract symptoms (LUTS)   . Lumbago   . Heart attack (HCC) 03/2014    mild  . Renal mass, right: Incentendal finding MRI 10/02/2015 10/03/2015  . Hypogonadism in male 10/03/2015   Past Surgical History  Procedure Laterality Date  . Penile prosthesis implant    . Tonsillectomy    . Colonoscopy  06/25/2005    Dr. Jeani Hawking  . Left heart catheterization with coronary angiogram [name] 03/13/2014    Procedure: LEFT HEART CATHETERIZATION WITH CORONARY ANGIOGRAM;  Surgeon: Peter M Swaziland, MD;  Location: [facility] CATH LAB;  Service: Cardiovascular;  Laterality: [name];  . Peripheral vascular catheterization [name] 02/19/2015    Procedure: Abdominal Aortogram;  Surgeon: Nada Libman, MD;  Location: [facility] INVASIVE CV LAB;  Service: Cardiovascular;  Laterality: [name];  . Peripheral vascular catheterization Left 02/19/2015    Procedure: Lower Extremity Angiography;  Surgeon: Nada Libman, MD;  Location: [facility] INVASIVE CV LAB;  Service: Cardiovascular;  Laterality: Left;   Family History  Problem Relation Age of Onset  . Bone cancer Mother   . Breast cancer Mother   . Cancer Mother   . Lung cancer Father   . Cancer Father   . Breast cancer Sister   . Cancer Sister   . Lung cancer Brother   . Cancer Sister     type unknown   Social History  Substance Use Topics  . Smoking status: Former Smoker    Types: Cigarettes    Quit date: 09/28/2004  . Smokeless tobacco: Never Used  . Alcohol Use: 0.0 oz/week    0 Standard drinks or equivalent per week     Comment: occ/social (moderate)    Review of Systems All other systems negative except as documented in the HPI. All pertinent positives and negatives as reviewed in the HPI.   Allergies  Ace inhibitors;  Cymbalta; Lipitor; Bee venom; Glucerna; and Angiotensin receptor blockers  Home Medications   Prior to Admission medications   Medication Sig Start Date End Date Taking? Authorizing Provider  aspirin 81 MG tablet Take 81 mg by mouth at bedtime.     Historical Provider, MD  budesonide-formoterol (SYMBICORT) 160-4.5 MCG/ACT inhaler Inhale 2 puffs into the lungs 2 (two) times daily. 10/03/15   Rodolph Bong, MD  clopidogrel (PLAVIX) 75 MG tablet TAKE 1 TABLET BY MOUTH DAILY WITH BREAKFAST 06/11/15   Kimber Relic, MD  diphenoxylate-atropine (LOMOTIL) 2.5-0.025 MG tablet One up to 4 times daily if needed for diarrhea 10/16/15   Kimber Relic, MD  escitalopram (LEXAPRO) 20 MG tablet TAKE ONE TABLET BY MOUTH ONE TIME DAILY 08/07/15   Kimber Relic, MD  glucose blood (ONE TOUCH TEST STRIPS) test strip 1 each by Other route 4 (four) times daily. Use as instructed    Historical Provider, MD  HYDROcodone-acetaminophen (NORCO/VICODIN) 5-325 MG tablet Take 1 tablet by mouth every 6 (six)  hours as needed for moderate pain or severe pain.    Historical Provider, MD  insulin aspart (NOVOLOG) 100 UNIT/ML injection Inject 3 Units into the skin 3 (three) times daily with meals. 10/21/15   Hollice Espy, MD  insulin aspart (NOVOLOG) 100 UNIT/ML injection Sliding Scale: If BS 70-159=4 units, 160-215=5 units, 220-279=6 units, 280-339=7 units, 340-399=8 units. If greater than 400 give 9 units 10/21/15   Hollice Espy, MD  insulin aspart (NOVOLOG) 100 UNIT/ML injection Sliding Scale: If BS 70-199=0 units, 200-249=2 units, 250-299=3 units, 300-349=4 units, 350-399=5 units. If greater than 400 give 6 units 10/21/15   Hollice Espy, MD  Insulin Glargine (TOUJEO SOLOSTAR) 300 UNIT/ML SOPN Inject 11 Units into the skin at bedtime.     Historical Provider, MD  levETIRAcetam (KEPPRA) 750 MG tablet Take 750 mg by mouth 2 (two) times daily. For seizures    Historical Provider, MD  levofloxacin (LEVAQUIN) 500 MG tablet  Take 1 tablet (500 mg total) by mouth every other day. 10/22/15 10/24/15  Hollice Espy, MD  levothyroxine (SYNTHROID, LEVOTHROID) 75 MCG tablet Take 1 tablet (75 mcg total) by mouth daily. 05/10/15   Kimber Relic, MD  LORazepam (ATIVAN) 2 MG/ML injection Inject 0.5 mg into the muscle every 6 (six) hours as needed for anxiety or seizure.    Historical Provider, MD  methocarbamol (ROBAXIN) 500 MG tablet Take 500 mg by mouth every 6 (six) hours as needed for muscle spasms.     Historical Provider, MD  metoprolol tartrate (LOPRESSOR) 25 MG tablet Take One tablet by mouth twice daily to control BP 09/23/15   Kimber Relic, MD  Multiple Vitamin (MULTIVITAMIN WITH MINERALS) TABS tablet Take 1 tablet by mouth daily. 02/22/15   Osvaldo Shipper, MD  pantoprazole (PROTONIX) 40 MG tablet TAKE ONE TABLET BY MOUTH ONE TIME DAILY 05/14/15   Kimber Relic, MD  pravastatin (PRAVACHOL) 20 MG tablet Take 1 tablet (20 mg total) by mouth every evening. NEEDS APPOINTMENT FOR FUTURE REFILLS OR 90-DAY SUPPLY Patient taking differently: Take 20 mg by mouth daily.  09/07/15   Rollene Rotunda, MD  pregabalin (LYRICA) 50 MG capsule Take one capsule by mouth in the morning and two capsules by mouth in the evening for pains. 08/14/15   Kimber Relic, MD  saccharomyces boulardii (FLORASTOR) 250 MG capsule Take 1 capsule (250 mg total) by mouth every evening. 10/21/15   Hollice Espy, MD  tiotropium (SPIRIVA) 18 MCG inhalation capsule Place 1 capsule (18 mcg total) into inhaler and inhale daily. 10/03/15   Rodolph Bong, MD  vitamin B-12 (CYANOCOBALAMIN) 1000 MCG tablet Take 1,000 mcg by mouth daily.    Historical Provider, MD   BP 91/72 mmHg  Pulse 64  Temp(Src) 97.3 F (36.3 C) (Oral)  Resp 18  Wt 62.596 kg  SpO2 99% Physical Exam  Constitutional: He is oriented to person, place, and time. He appears well-developed and well-nourished. No distress.  HENT:  Head: Normocephalic and atraumatic.  Right Ear: External ear  normal.  Left Ear: External ear normal.  Eyes: Conjunctivae and EOM are normal. Pupils are equal, round, and reactive to light. Right eye exhibits no discharge. Left eye exhibits no discharge.  Neck: Normal range of motion. Neck supple.  Cardiovascular: Normal rate, regular rhythm and intact distal pulses.  Exam reveals no gallop and no friction rub.   No murmur heard. Pulmonary/Chest: Effort normal and breath sounds normal. No respiratory distress. He has no wheezes. He  has no rales. He exhibits no tenderness.  Abdominal: Soft. Bowel sounds are normal. He exhibits no distension and no mass. There is no tenderness. There is no rebound and no guarding.  Musculoskeletal: Normal range of motion. He exhibits no edema or tenderness.  Neurological: He is alert and oriented to person, place, and time. No cranial nerve deficit. Coordination normal.  Skin: Skin is warm and dry. No rash noted. He is not diaphoretic. No erythema. No pallor.  Psychiatric: He has a normal mood and affect. His behavior is normal. Thought content normal.    ED Course  Procedures (including critical care time) Labs Review Labs Reviewed  BASIC METABOLIC PANEL - Abnormal; Notable for the following:    Glucose, Bld 397 (*)    BUN 27 (*)    Creatinine, Ser 1.81 (*)    GFR calc non Af Amer 36 (*)    GFR calc Af Amer 41 (*)    All other components within normal limits  CBC WITH DIFFERENTIAL/PLATELET - Abnormal; Notable for the following:    RBC 3.22 (*)    Hemoglobin 10.2 (*)    HCT 30.8 (*)    All other components within normal limits  URINALYSIS, ROUTINE W REFLEX MICROSCOPIC (NOT AT [facility]) - Abnormal; Notable for the following:    Glucose, UA 500 (*)    All other components within normal limits  CBG MONITORING, ED - Abnormal; Notable for the following:    Glucose-Capillary 131 (*)    All other components within normal limits    Imaging Review No results found. I have personally reviewed and evaluated these images  and lab results as part of my medical decision-making.   Spoke with neurology who will evaluate the patient.  They recommended increasing his Keppra dosage and having follow-up with his neurologist.  Patient, discussed the plan and all questions were answered.  Patient has been stable here in the emergency department  Charlestine Night, PA-C 10/23/15 1942  Blane Ohara, MD 10/27/15 [phone]

## 2015-10-23 LAB — GLUCOSE, CAPILLARY
GLUCOSE-CAPILLARY: 152 mg/dL — AB (ref 65–99)
GLUCOSE-CAPILLARY: 171 mg/dL — AB (ref 65–99)
Glucose-Capillary: 170 mg/dL — ABNORMAL HIGH (ref 65–99)
Glucose-Capillary: 168 mg/dL — ABNORMAL HIGH (ref 65–99)
Glucose-Capillary: 145 mg/dL — ABNORMAL HIGH (ref 65–99)
Glucose-Capillary: 143 mg/dL — ABNORMAL HIGH (ref 65–99)
Glucose-Capillary: 180 mg/dL — ABNORMAL HIGH (ref 65–99)
Glucose-Capillary: 144 mg/dL — ABNORMAL HIGH (ref 65–99)
Glucose-Capillary: 142 mg/dL — ABNORMAL HIGH (ref 65–99)
Glucose-Capillary: 97 mg/dL (ref 65–99)

## 2015-10-24 DIAGNOSIS — J440 Chronic obstructive pulmonary disease with acute lower respiratory infection: Secondary | ICD-10-CM | POA: Diagnosis not present

## 2015-10-24 DIAGNOSIS — I2510 Atherosclerotic heart disease of native coronary artery without angina pectoris: Secondary | ICD-10-CM | POA: Diagnosis not present

## 2015-10-24 DIAGNOSIS — E1065 Type 1 diabetes mellitus with hyperglycemia: Secondary | ICD-10-CM | POA: Diagnosis not present

## 2015-10-24 DIAGNOSIS — E1049 Type 1 diabetes mellitus with other diabetic neurological complication: Secondary | ICD-10-CM | POA: Diagnosis not present

## 2015-10-24 DIAGNOSIS — J690 Pneumonitis due to inhalation of food and vomit: Secondary | ICD-10-CM | POA: Diagnosis not present

## 2015-10-24 DIAGNOSIS — G8929 Other chronic pain: Secondary | ICD-10-CM | POA: Diagnosis not present

## 2015-10-24 DIAGNOSIS — N181 Chronic kidney disease, stage 1: Secondary | ICD-10-CM | POA: Diagnosis not present

## 2015-10-24 DIAGNOSIS — G40209 Localization-related (focal) (partial) symptomatic epilepsy and epileptic syndromes with complex partial seizures, not intractable, without status epilepticus: Secondary | ICD-10-CM | POA: Diagnosis not present

## 2015-10-24 DIAGNOSIS — E1022 Type 1 diabetes mellitus with diabetic chronic kidney disease: Secondary | ICD-10-CM | POA: Diagnosis not present

## 2015-10-24 DIAGNOSIS — I129 Hypertensive chronic kidney disease with stage 1 through stage 4 chronic kidney disease, or unspecified chronic kidney disease: Secondary | ICD-10-CM | POA: Diagnosis not present

## 2015-10-24 LAB — CULTURE, BLOOD (ROUTINE X 2)
CULTURE: NO GROWTH
CULTURE: NO GROWTH

## 2015-10-28 ENCOUNTER — Emergency Department (HOSPITAL_COMMUNITY): Payer: Medicare Other

## 2015-10-28 ENCOUNTER — Inpatient Hospital Stay (HOSPITAL_COMMUNITY)
Admission: EM | Admit: 2015-10-28 | Discharge: 2015-11-04 | DRG: 682 | Disposition: A | Discharge status: Skilled Nursing Facility | Payer: Medicare Other | Attending: Internal Medicine | Admitting: Internal Medicine

## 2015-10-28 ENCOUNTER — Encounter (HOSPITAL_COMMUNITY): Payer: Self-pay | Admitting: Emergency Medicine

## 2015-10-28 DIAGNOSIS — Z951 Presence of aortocoronary bypass graft: Secondary | ICD-10-CM

## 2015-10-28 DIAGNOSIS — N183 Chronic kidney disease, stage 3 (moderate): Secondary | ICD-10-CM | POA: Diagnosis present

## 2015-10-28 DIAGNOSIS — N179 Acute kidney failure, unspecified: Secondary | ICD-10-CM | POA: Diagnosis not present

## 2015-10-28 DIAGNOSIS — G609 Hereditary and idiopathic neuropathy, unspecified: Secondary | ICD-10-CM | POA: Diagnosis present

## 2015-10-28 DIAGNOSIS — Z87891 Personal history of nicotine dependence: Secondary | ICD-10-CM

## 2015-10-28 DIAGNOSIS — E785 Hyperlipidemia, unspecified: Secondary | ICD-10-CM | POA: Diagnosis not present

## 2015-10-28 DIAGNOSIS — D6489 Other specified anemias: Secondary | ICD-10-CM | POA: Diagnosis present

## 2015-10-28 DIAGNOSIS — F0390 Unspecified dementia without behavioral disturbance: Secondary | ICD-10-CM | POA: Diagnosis present

## 2015-10-28 DIAGNOSIS — R531 Weakness: Secondary | ICD-10-CM | POA: Diagnosis not present

## 2015-10-28 DIAGNOSIS — R4182 Altered mental status, unspecified: Secondary | ICD-10-CM | POA: Diagnosis not present

## 2015-10-28 DIAGNOSIS — E1042 Type 1 diabetes mellitus with diabetic polyneuropathy: Secondary | ICD-10-CM | POA: Diagnosis not present

## 2015-10-28 DIAGNOSIS — I10 Essential (primary) hypertension: Secondary | ICD-10-CM | POA: Diagnosis present

## 2015-10-28 DIAGNOSIS — Z794 Long term (current) use of insulin: Secondary | ICD-10-CM | POA: Diagnosis not present

## 2015-10-28 DIAGNOSIS — I959 Hypotension, unspecified: Secondary | ICD-10-CM | POA: Diagnosis present

## 2015-10-28 DIAGNOSIS — G40909 Epilepsy, unspecified, not intractable, without status epilepticus: Secondary | ICD-10-CM | POA: Diagnosis present

## 2015-10-28 DIAGNOSIS — I2510 Atherosclerotic heart disease of native coronary artery without angina pectoris: Secondary | ICD-10-CM | POA: Diagnosis present

## 2015-10-28 DIAGNOSIS — E1049 Type 1 diabetes mellitus with other diabetic neurological complication: Secondary | ICD-10-CM | POA: Diagnosis present

## 2015-10-28 DIAGNOSIS — R4781 Slurred speech: Secondary | ICD-10-CM | POA: Insufficient documentation

## 2015-10-28 DIAGNOSIS — R739 Hyperglycemia, unspecified: Secondary | ICD-10-CM | POA: Diagnosis not present

## 2015-10-28 DIAGNOSIS — I739 Peripheral vascular disease, unspecified: Secondary | ICD-10-CM | POA: Diagnosis not present

## 2015-10-28 DIAGNOSIS — E039 Hypothyroidism, unspecified: Secondary | ICD-10-CM | POA: Diagnosis present

## 2015-10-28 DIAGNOSIS — J690 Pneumonitis due to inhalation of food and vomit: Secondary | ICD-10-CM | POA: Diagnosis present

## 2015-10-28 DIAGNOSIS — E860 Dehydration: Secondary | ICD-10-CM | POA: Diagnosis not present

## 2015-10-28 DIAGNOSIS — K5289 Other specified noninfective gastroenteritis and colitis: Secondary | ICD-10-CM | POA: Diagnosis present

## 2015-10-28 DIAGNOSIS — E1065 Type 1 diabetes mellitus with hyperglycemia: Secondary | ICD-10-CM

## 2015-10-28 DIAGNOSIS — E1010 Type 1 diabetes mellitus with ketoacidosis without coma: Secondary | ICD-10-CM | POA: Diagnosis present

## 2015-10-28 DIAGNOSIS — E103299 Type 1 diabetes mellitus with mild nonproliferative diabetic retinopathy without macular edema, unspecified eye: Secondary | ICD-10-CM | POA: Diagnosis not present

## 2015-10-28 DIAGNOSIS — J449 Chronic obstructive pulmonary disease, unspecified: Secondary | ICD-10-CM | POA: Diagnosis present

## 2015-10-28 DIAGNOSIS — I252 Old myocardial infarction: Secondary | ICD-10-CM

## 2015-10-28 DIAGNOSIS — Z7902 Long term (current) use of antithrombotics/antiplatelets: Secondary | ICD-10-CM

## 2015-10-28 DIAGNOSIS — R569 Unspecified convulsions: Secondary | ICD-10-CM

## 2015-10-28 DIAGNOSIS — R05 Cough: Secondary | ICD-10-CM | POA: Diagnosis not present

## 2015-10-28 DIAGNOSIS — R197 Diarrhea, unspecified: Secondary | ICD-10-CM | POA: Diagnosis not present

## 2015-10-28 DIAGNOSIS — Z7982 Long term (current) use of aspirin: Secondary | ICD-10-CM | POA: Diagnosis not present

## 2015-10-28 DIAGNOSIS — R7309 Other abnormal glucose: Secondary | ICD-10-CM | POA: Diagnosis not present

## 2015-10-28 DIAGNOSIS — I129 Hypertensive chronic kidney disease with stage 1 through stage 4 chronic kidney disease, or unspecified chronic kidney disease: Secondary | ICD-10-CM | POA: Diagnosis not present

## 2015-10-28 DIAGNOSIS — IMO0002 Reserved for concepts with insufficient information to code with codable children: Secondary | ICD-10-CM | POA: Diagnosis present

## 2015-10-28 DIAGNOSIS — N2889 Other specified disorders of kidney and ureter: Secondary | ICD-10-CM | POA: Diagnosis present

## 2015-10-28 DIAGNOSIS — R748 Abnormal levels of other serum enzymes: Secondary | ICD-10-CM | POA: Diagnosis not present

## 2015-10-28 DIAGNOSIS — E1022 Type 1 diabetes mellitus with diabetic chronic kidney disease: Secondary | ICD-10-CM | POA: Diagnosis present

## 2015-10-28 DIAGNOSIS — R630 Anorexia: Secondary | ICD-10-CM | POA: Diagnosis present

## 2015-10-28 DIAGNOSIS — Y95 Nosocomial condition: Secondary | ICD-10-CM | POA: Diagnosis present

## 2015-10-28 DIAGNOSIS — G9340 Encephalopathy, unspecified: Secondary | ICD-10-CM | POA: Diagnosis present

## 2015-10-28 DIAGNOSIS — K6389 Other specified diseases of intestine: Secondary | ICD-10-CM | POA: Diagnosis not present

## 2015-10-28 HISTORY — DX: Unspecified asthma, uncomplicated: J45.909

## 2015-10-28 LAB — URINALYSIS, ROUTINE W REFLEX MICROSCOPIC
HGB URINE DIPSTICK: NEGATIVE
KETONES UR: 15 mg/dL — AB
Leukocytes, UA: NEGATIVE
NITRITE: NEGATIVE
PH: 5 (ref 5.0–8.0)
Protein, ur: NEGATIVE mg/dL
Specific Gravity, Urine: 1.018 (ref 1.005–1.030)

## 2015-10-28 LAB — RAPID URINE DRUG SCREEN, HOSP PERFORMED
Amphetamines: NOT DETECTED
BARBITURATES: NOT DETECTED
Benzodiazepines: NOT DETECTED
Cocaine: NOT DETECTED
Opiates: NOT DETECTED
Tetrahydrocannabinol: NOT DETECTED

## 2015-10-28 LAB — CBC WITH DIFFERENTIAL/PLATELET
BASOS PCT: 0 %
Basophils Absolute: 0 10*3/uL (ref 0.0–0.1)
EOS PCT: 1 %
Eosinophils Absolute: 0.1 10*3/uL (ref 0.0–0.7)
HEMATOCRIT: 26.7 % — AB (ref 39.0–52.0)
Hemoglobin: 9.2 g/dL — ABNORMAL LOW (ref 13.0–17.0)
LYMPHS ABS: 1.6 10*3/uL (ref 0.7–4.0)
Lymphocytes Relative: 18 %
MCH: 32.1 pg (ref 26.0–34.0)
MCHC: 34.5 g/dL (ref 30.0–36.0)
MCV: 93 fL (ref 78.0–100.0)
MONOS PCT: 13 %
Monocytes Absolute: 1.2 10*3/uL — ABNORMAL HIGH (ref 0.1–1.0)
NEUTROS ABS: 6 10*3/uL (ref 1.7–7.7)
Neutrophils Relative %: 68 %
Platelets: 245 10*3/uL (ref 150–400)
RBC: 2.87 MIL/uL — AB (ref 4.22–5.81)
RDW: 13 % (ref 11.5–15.5)
WBC: 8.9 10*3/uL (ref 4.0–10.5)

## 2015-10-28 LAB — URINE MICROSCOPIC-ADD ON

## 2015-10-28 LAB — COMPREHENSIVE METABOLIC PANEL
ALT: 22 U/L (ref 17–63)
AST: 25 U/L (ref 15–41)
Albumin: 3.5 g/dL (ref 3.5–5.0)
Alkaline Phosphatase: 87 U/L (ref 38–126)
Anion gap: 17 — ABNORMAL HIGH (ref 5–15)
BILIRUBIN TOTAL: 0.3 mg/dL (ref 0.3–1.2)
BUN: 52 mg/dL — AB (ref 6–20)
CO2: 22 mmol/L (ref 22–32)
CREATININE: 2.36 mg/dL — AB (ref 0.61–1.24)
Calcium: 9.8 mg/dL (ref 8.9–10.3)
Chloride: 97 mmol/L — ABNORMAL LOW (ref 101–111)
GFR calc Af Amer: 30 mL/min — ABNORMAL LOW (ref >60)
GFR, EST NON AFRICAN AMERICAN: 26 mL/min — AB (ref >60)
Glucose, Bld: 265 mg/dL — ABNORMAL HIGH (ref 65–99)
POTASSIUM: 4.2 mmol/L (ref 3.5–5.1)
Sodium: 136 mmol/L (ref 135–145)
TOTAL PROTEIN: 6.9 g/dL (ref 6.5–8.1)

## 2015-10-28 LAB — PROTIME-INR
INR: 0.92 (ref 0.00–1.49)
PROTHROMBIN TIME: 12.6 s (ref 11.6–15.2)

## 2015-10-28 LAB — CBG MONITORING, ED
GLUCOSE-CAPILLARY: 518 mg/dL — AB (ref 65–99)
Glucose-Capillary: 238 mg/dL — ABNORMAL HIGH (ref 65–99)
Glucose-Capillary: 342 mg/dL — ABNORMAL HIGH (ref 65–99)

## 2015-10-28 LAB — LIPASE, BLOOD: LIPASE: 33 U/L (ref 11–51)

## 2015-10-28 LAB — ETHANOL

## 2015-10-28 LAB — LACTIC ACID, PLASMA
Lactic Acid, Venous: 0.9 mmol/L (ref 0.5–2.0)
Lactic Acid, Venous: 1.1 mmol/L (ref 0.5–2.0)

## 2015-10-28 MED ORDER — IOHEXOL 300 MG/ML  SOLN
100.0000 mL | Freq: Once | INTRAMUSCULAR | Status: AC | PRN
Start: 2015-10-28 — End: 2015-10-28
  Administered 2015-10-28: 100 mL via INTRAVENOUS

## 2015-10-28 MED ORDER — IOHEXOL 300 MG/ML  SOLN: 25.0000 mL | INTRAMUSCULAR | Status: AC

## 2015-10-28 MED ORDER — VANCOMYCIN HCL IN DEXTROSE 1-5 GM/200ML-% IV SOLN
1000.0000 mg | Freq: Once | INTRAVENOUS | Status: AC
  Administered 2015-10-28: 1000 mg via INTRAVENOUS
  Filled 2015-10-28: qty 200

## 2015-10-28 MED ORDER — SODIUM CHLORIDE 0.9 % IV BOLUS (SEPSIS)
1000.0000 mL | Freq: Once | INTRAVENOUS | Status: AC
  Administered 2015-10-28: 1000 mL via INTRAVENOUS

## 2015-10-28 MED ORDER — PIPERACILLIN-TAZOBACTAM 3.375 G IVPB
3.3750 g | Freq: Once | INTRAVENOUS | Status: AC
  Administered 2015-10-28: 3.375 g via INTRAVENOUS
  Filled 2015-10-28: qty 50

## 2015-10-28 NOTE — ED Notes (Signed)
Patient transported to CT 

## 2015-10-28 NOTE — ED Notes (Signed)
Called lab able to add lipase to available blood not lactic acid.

## 2015-10-28 NOTE — ED Notes (Signed)
Arrived via EMS stated patient lives in an assisted living facility and went to [facility] office for check up. During transportation patient lethargic and at [facility] office witnessed mini seizures. EMS arrived states patient alert to name and city.  Upon arrival patient alert slow to answer questions alert x4. CBG per EMS 438.

## 2015-10-28 NOTE — ED Notes (Signed)
Spoke with EDP no blood cultures need to be ordered for patient.

## 2015-10-28 NOTE — ED Notes (Signed)
Patient returned. Notified CT to have CT head completed first then have CT abdomen pelvis completed seconds. CT transported arrived to transport patient to CT. Family member at bedside.

## 2015-10-28 NOTE — ED Provider Notes (Addendum)
CSN: [id number]     Arrival date & time 10/28/15  1406 History   First MD Initiated Contact with Patient 10/28/15 1441     Chief Complaint  Patient presents with  . Seizures  . Hyperglycemia     (Consider location/radiation/quality/duration/timing/severity/associated sxs/prior Treatment) HPI  75 year old man with a history of insulin-dependent diabetes recently discharged from the hospital who presents today with increased weakness and poor appetite. His daughter-in-law is present with him and is the main historian. She states that he has had multiple recent hospitalizations and was most recently discharged week ago today. She states that he did have some pneumonia at that time was discharged on every other day antibiotic. She states that he has been improved this past week and was walking with his walker as usual. She went to meet him to take him to a doctor's appointment today where he appeared very weak and had a near-syncopal episode. He states he has not been eating for several days although he is also unclear as to what day of week it is.  Past Medical History  Diagnosis Date  . CAD (coronary artery disease)     LAD 40-50% stenosis, first diagonal small 90% stenosis, circumflex 70% stenosis, OM 80% stenosis, right coronary artery 80-90% stenosis.  . Right upper lobe pneumonia 11/28/2008  . Anxiety 07/18/2008  . Irritable bowel syndrome 04/27/2008  . COPD (chronic obstructive pulmonary disease) (HCC) 02/27/2008  . B12 deficiency 02/16/2008    in 5/09: B12 262, MMA 1040  . Chronic diarrhea 01/01/2008    s/p EGD 10/06: H pylori + gastritis, duodenal biopsy normal (Dr. Elnoria Howard); s/p colonoscopy 10/06: hemorrhoids (Dr. Elnoria Howard); evaluated by Dr. Yancey Flemings  in 8/09: tissue transglutaminase AB negative, VIP normal, stool fat content normal, urine 5-HIAA normal; normal GES 11/09 (done for "fluctuating sugars")  . 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 Orthostatic  hypotension 02/09/2007  . Hypertension 02/09/2007  . Spinal stenosis in cervical region 05/03    MRI  . Erectile dysfunction 09/27/2006    s/p penile implant  . Anemia, iron deficiency 09/27/2006    neg colonoscopy 2006 - Dr. Elnoria Howard; ferritin 152; hgb  15.7  on 07/07  . Hypothyroidism 09/27/2006    TSH 2.639  07/07  . Hypersomnia 09/27/2006    evaluated by Dr. Jetty Duhamel (5/08) and Dr. Vickey Huger; PSG 8/09: chronic delayed sleep phase syndrome (patient sleeps during the day and is awake at night), nocturnal myoclonus (eval for RLS and IDA suggested). Pt advised to change sleeping behavior  . Diabetes mellitus type II 09/07/1984    poorly controlled, complicated by peripheral neuropathy, microalbuminuria, mild non proliferative retinopathy, s/p DKA 7/05, on insulin pump x 4/09, started a pump vacation on 11/19/2008  . Hyperlipidemia   . 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 Hemorrhoids   . Chronic kidney disease   . Unspecified hereditary and idiopathic peripheral neuropathy   . Other chronic pain   . Other malaise and fatigue   . Depression   . Hypertrophy of prostate without urinary obstruction and other lower urinary tract symptoms (LUTS)   . Lumbago   . Heart attack (HCC) 03/2014    mild  . Renal mass, right: Incentendal finding MRI 10/02/2015 10/03/2015  . Hypogonadism in male 10/03/2015   Past Surgical History  Procedure Laterality Date  . Penile prosthesis implant    . Tonsillectomy    . Colonoscopy  06/25/2005    Dr. Jeani Hawking  . Left heart catheterization with coronary angiogram [name]  03/13/2014    Procedure: LEFT HEART CATHETERIZATION WITH CORONARY ANGIOGRAM;  Surgeon: Peter M Swaziland, MD;  Location: [facility] CATH LAB;  Service: Cardiovascular;  Laterality: [name];  . Peripheral vascular catheterization [name] 02/19/2015    Procedure: Abdominal Aortogram;  Surgeon: Nada Libman, MD;  Location: [facility] INVASIVE CV LAB;  Service: Cardiovascular;  Laterality: [name];  . Peripheral vascular catheterization  Left 02/19/2015    Procedure: Lower Extremity Angiography;  Surgeon: Nada Libman, MD;  Location: [facility] INVASIVE CV LAB;  Service: Cardiovascular;  Laterality: Left;   Family History  Problem Relation Age of Onset  . Bone cancer Mother   . Breast cancer Mother   . Cancer Mother   . Lung cancer Father   . Cancer Father   . Breast cancer Sister   . Cancer Sister   . Lung cancer Brother   . Cancer Sister     type unknown   Social History  Substance Use Topics  . Smoking status: Former Smoker    Types: Cigarettes    Quit date: 09/28/2004  . Smokeless tobacco: Never Used  . Alcohol Use: 0.0 oz/week    0 Standard drinks or equivalent per week     Comment: occ/social (moderate)    Review of Systems  Unable to perform ROS: Mental status change      Allergies  Ace inhibitors; Cymbalta; Lipitor; Bee venom; Glucerna; and Angiotensin receptor blockers  Home Medications   Prior to Admission medications   Medication Sig Start Date End Date Taking? Authorizing Provider  aspirin 81 MG tablet Take 81 mg by mouth at bedtime.    Yes Historical Provider, MD  budesonide-formoterol (SYMBICORT) 160-4.5 MCG/ACT inhaler Inhale 2 puffs into the lungs 2 (two) times daily. 10/03/15  Yes Rodolph Bong, MD  clopidogrel (PLAVIX) 75 MG tablet TAKE 1 TABLET BY MOUTH DAILY WITH BREAKFAST 06/11/15  Yes Kimber Relic, MD  escitalopram (LEXAPRO) 20 MG tablet TAKE ONE TABLET BY MOUTH ONE TIME DAILY 08/07/15  Yes Kimber Relic, MD  Insulin Glargine (TOUJEO SOLOSTAR) 300 UNIT/ML SOPN Inject 11 Units into the skin at bedtime.    Yes Historical Provider, MD  insulin lispro (HUMALOG KWIKPEN) 100 UNIT/ML KiwkPen Inject 4-9 Units into the skin 3 (three) times daily. bs 70-159=4 units, 160-219=5 units, 220-279=6 units, 280-339=7 units, 340-399=8 units, if bs >400=9 units   Yes Historical Provider, MD  levETIRAcetam (KEPPRA) 1000 MG tablet Take 1 tablet (1,000 mg total) by mouth 2 (two) times daily. For seizures  10/22/15  Yes Charlestine Night, PA-C  levothyroxine (SYNTHROID, LEVOTHROID) 75 MCG tablet Take 1 tablet (75 mcg total) by mouth daily. 05/10/15  Yes Kimber Relic, MD  metoprolol tartrate (LOPRESSOR) 25 MG tablet Take One tablet by mouth twice daily to control BP Patient taking differently: Take 25 mg by mouth 2 (two) times daily. Take One tablet by mouth twice daily to control BP 09/23/15  Yes Kimber Relic, MD  Multiple Vitamin (MULTIVITAMIN WITH MINERALS) TABS tablet Take 1 tablet by mouth daily. 02/22/15  Yes Osvaldo Shipper, MD  pantoprazole (PROTONIX) 40 MG tablet TAKE ONE TABLET BY MOUTH ONE TIME DAILY 05/14/15  Yes Kimber Relic, MD  pravastatin (PRAVACHOL) 20 MG tablet Take 1 tablet (20 mg total) by mouth every evening. NEEDS APPOINTMENT FOR FUTURE REFILLS OR 90-DAY SUPPLY Patient taking differently: Take 20 mg by mouth daily.  09/07/15  Yes Rollene Rotunda, MD  pregabalin (LYRICA) 50 MG capsule Take one capsule by mouth in the morning  and two capsules by mouth in the evening for pains. Patient taking differently: Take 50-100 mg by mouth daily. Take one capsule by mouth in the morning and two capsules by mouth in the evening for pains. 08/14/15  Yes Kimber Relic, MD  saccharomyces boulardii (FLORASTOR) 250 MG capsule Take 1 capsule (250 mg total) by mouth every evening. 10/21/15  Yes Hollice Espy, MD  tiotropium (SPIRIVA) 18 MCG inhalation capsule Place 1 capsule (18 mcg total) into inhaler and inhale daily. 10/03/15  Yes Rodolph Bong, MD  vitamin B-12 (CYANOCOBALAMIN) 1000 MCG tablet Take 1,000 mcg by mouth daily.   Yes Historical Provider, MD  diphenoxylate-atropine (LOMOTIL) 2.5-0.025 MG tablet One up to 4 times daily if needed for diarrhea Patient not taking: Reported on 10/22/2015 10/16/15   Kimber Relic, MD  glucose blood (ONE TOUCH TEST STRIPS) test strip 1 each by Other route 4 (four) times daily. Use as instructed    Historical Provider, MD  insulin aspart (NOVOLOG) 100 UNIT/ML  injection Inject 3 Units into the skin 3 (three) times daily with meals. Patient not taking: Reported on 10/22/2015 10/21/15   Hollice Espy, MD  insulin aspart (NOVOLOG) 100 UNIT/ML injection Sliding Scale: If BS 70-159=4 units, 160-215=5 units, 220-279=6 units, 280-339=7 units, 340-399=8 units. If greater than 400 give 9 units Patient not taking: Reported on 10/22/2015 10/21/15   Hollice Espy, MD  insulin aspart (NOVOLOG) 100 UNIT/ML injection Sliding Scale: If BS 70-199=0 units, 200-249=2 units, 250-299=3 units, 300-349=4 units, 350-399=5 units. If greater than 400 give 6 units Patient not taking: Reported on 10/22/2015 10/21/15   Hollice Espy, MD   BP 109/67 mmHg  Pulse 57  Temp(Src) 97.6 F (36.4 C) (Rectal)  Resp 16  Ht  (1.676 m)  Wt 57.153 kg  BMI 20.35 kg/m2  SpO2 100% Physical Exam  Constitutional: He appears well-developed and well-nourished. No distress.  HENT:  Head: Normocephalic.  Right Ear: External ear normal.  Left Ear: External ear normal.  Mucous membranes appear dry  Eyes: Pupils are equal, round, and reactive to light.  Neck: Normal range of motion. Neck supple.  Cardiovascular: Normal rate and regular rhythm.   Pulmonary/Chest: Effort normal.  Abdominal: Soft. Bowel sounds are normal. There is tenderness.  Mild to moderate diffuse ttp  Musculoskeletal: Normal range of motion.  Neurological: He is alert. He displays normal reflexes. No cranial nerve deficit. He exhibits normal muscle tone. Coordination normal.  Skin: Skin is warm and dry.  Nursing note and vitals reviewed.   ED Course  Procedures (including critical care time) Labs Review Labs Reviewed  CBC WITH DIFFERENTIAL/PLATELET - Abnormal; Notable for the following:    RBC 2.87 (*)    Hemoglobin 9.2 (*)    HCT 26.7 (*)    Monocytes Absolute 1.2 (*)    All other components within normal limits  COMPREHENSIVE METABOLIC PANEL - Abnormal; Notable for the following:    Chloride 97 (*)     Glucose, Bld 265 (*)    BUN 52 (*)    Creatinine, Ser 2.36 (*)    GFR calc non Af Amer 26 (*)    GFR calc Af Amer 30 (*)    Anion gap 17 (*)    All other components within normal limits  CBG MONITORING, ED - Abnormal; Notable for the following:    Glucose-Capillary 238 (*)    All other components within normal limits  PROTIME-INR  ETHANOL  URINALYSIS, ROUTINE W REFLEX  MICROSCOPIC (NOT AT [facility])  URINE RAPID DRUG SCREEN, HOSP PERFORMED    Imaging Review Dg Chest 1 View  10/28/2015  CLINICAL DATA:  Altered mental status.  Lethargy.  Cough. EXAM: CHEST 1 VIEW COMPARISON:  10/18/2015.  09/27/2015. FINDINGS: Heart size is normal with some left ventricular prominence. There is calcification of the aorta. There is chronic elevation of left hemidiaphragm. There is increased density at the left base when compared to previous studies consistent with left lower lobe pneumonia. No lobar collapse. No effusions. IMPRESSION: Chronically elevated left hemidiaphragm. Newly seen left base infiltrate consistent with bronchopneumonia. Electronically Signed   By: Paulina Fusi M.D.   On: 10/28/2015 15:50   Ct Head Wo Contrast  10/28/2015  CLINICAL DATA:  Generalized weakness and altered mental status. Pt has no complaints of pain or injury Hx: HTN, Diabetes, MI, Chronic fatigue EXAM: CT HEAD WITHOUT CONTRAST TECHNIQUE: Contiguous axial images were obtained from the base of the skull through the vertex without intravenous contrast. COMPARISON:  10/18/2015 FINDINGS: Brain: Mild atrophy. No evidence of acute infarction, hemorrhage, extra-axial collection, ventriculomegaly, or mass effect. Vascular: No hyperdense vessel or unexpected calcification. Atherosclerotic and physiologic intracranial calcifications. Skull: Negative for fracture or focal lesion. Sinuses/Orbits: No acute findings. Other: None. IMPRESSION: 1. Negative for bleed or other acute intracranial process. Electronically Signed   By: Corlis Leak M.D.   On:  10/28/2015 16:16   I have personally reviewed and evaluated these images and lab results as part of my medical decision-making.   EKG Interpretation   Date/Time:  Monday October 28 2015 14:12:37 EST Ventricular Rate:  57 PR Interval:  149 QRS Duration: 109 QT Interval:  450 QTC Calculation: 438 R Axis:   78 Text Interpretation:  Sinus rhythm Nonspecific ST abnormality No  significant change since last tracing Confirmed by Zayonna Ayuso MD, Duwayne Heck  ([id number]) on 10/28/2015 4:49:31 PM      MDM     Patient with increased weakness and history of seizures. 1-acute kidney injury with creatinine here today elevated at 2.36. This may be secondary to volume depletion. He is receiving IV fluid bolus here. 2 suspect volume depletion 3 left basilar infiltrate-patient completed Levaquin this week, however this would be healthcare associated and he has had seizures. He is started on Zosyn and Vanco here. 4- mental status changes patient appears much clear here after initial fluid bolus. I see no focal neurological deficits. Head CT is normal. I suspect a metabolic encephalopathy secondary to the above.- 5- abdominal tenderness on exam- ct pending.   Discussed with Dr. Effie Shy- he will follow up ct and have patient admitted.  Margarita Grizzle, MD 10/28/15 1651  Margarita Grizzle, MD 10/28/15 [phone]

## 2015-10-28 NOTE — ED Notes (Signed)
CBG-238  

## 2015-10-28 NOTE — ED Notes (Signed)
CT dropped off two bottles of water . Patient started to drink one bottle for CT at [facility]

## 2015-10-28 NOTE — ED Provider Notes (Signed)
Evaluation post imaging, for disposition.  Patient presented with weakness, following a visit to his PCP office today. While there he was very weak and having near syncopal episode. He has been recently treated for pneumonia several different times, by report. He's had decreased appetite, for several days.  Patient is alert, lucid and comfortable. He appears disheveled.  Patient with moderate hyperglycemia, renal insufficiency/AKI, and CT abnormality, nonspecific. Patient will require admission for observation, and intravenous fluids with expected management.   Results for orders placed or performed during the hospital encounter of 10/28/15  CBC with Differential/Platelet  Result Value Ref Range   WBC 8.9 4.0 - 10.5 K/uL   RBC 2.87 (L) 4.22 - 5.81 MIL/uL   Hemoglobin 9.2 (L) 13.0 - 17.0 g/dL   HCT 16.1 (L) 09.6 - 04.5 %   MCV 93.0 78.0 - 100.0 fL   MCH 32.1 26.0 - 34.0 pg   MCHC 34.5 30.0 - 36.0 g/dL   RDW 40.9 81.1 - 91.4 %   Platelets 245 150 - 400 K/uL   Neutrophils Relative % 68 %   Lymphocytes Relative 18 %   Monocytes Relative 13 %   Eosinophils Relative 1 %   Basophils Relative 0 %   Neutro Abs 6.0 1.7 - 7.7 K/uL   Lymphs Abs 1.6 0.7 - 4.0 K/uL   Monocytes Absolute 1.2 (H) 0.1 - 1.0 K/uL   Eosinophils Absolute 0.1 0.0 - 0.7 K/uL   Basophils Absolute 0.0 0.0 - 0.1 K/uL   WBC Morphology ATYPICAL LYMPHOCYTES   Comprehensive metabolic panel  Result Value Ref Range   Sodium 136 135 - 145 mmol/L   Potassium 4.2 3.5 - 5.1 mmol/L   Chloride 97 (L) 101 - 111 mmol/L   CO2 22 22 - 32 mmol/L   Glucose, Bld 265 (H) 65 - 99 mg/dL   BUN 52 (H) 6 - 20 mg/dL   Creatinine, Ser 7.82 (H) 0.61 - 1.24 mg/dL   Calcium 9.8 8.9 - 95.6 mg/dL   Total Protein 6.9 6.5 - 8.1 g/dL   Albumin 3.5 3.5 - 5.0 g/dL   AST 25 15 - 41 U/L   ALT 22 17 - 63 U/L   Alkaline Phosphatase 87 38 - 126 U/L   Total Bilirubin 0.3 0.3 - 1.2 mg/dL   GFR calc non Af Amer 26 (L) >60 mL/min   GFR calc Af Amer 30 (L)  >60 mL/min   Anion gap 17 (H) 5 - 15  Protime-INR  Result Value Ref Range   Prothrombin Time 12.6 11.6 - 15.2 seconds   INR 0.92 0.00 - 1.49  Urinalysis, Routine w reflex microscopic (not at [facility])  Result Value Ref Range   Color, Urine YELLOW YELLOW   APPearance CLEAR CLEAR   Specific Gravity, Urine 1.018 1.005 - 1.030   pH 5.0 5.0 - 8.0   Glucose, UA >1000 (A) NEGATIVE mg/dL   Hgb urine dipstick NEGATIVE NEGATIVE   Bilirubin Urine MODERATE (A) NEGATIVE   Ketones, ur 15 (A) NEGATIVE mg/dL   Protein, ur NEGATIVE NEGATIVE mg/dL   Nitrite NEGATIVE NEGATIVE   Leukocytes, UA NEGATIVE NEGATIVE  Urine rapid drug screen (hosp performed)  Result Value Ref Range   Opiates NONE DETECTED NONE DETECTED   Cocaine NONE DETECTED NONE DETECTED   Benzodiazepines NONE DETECTED NONE DETECTED   Amphetamines NONE DETECTED NONE DETECTED   Tetrahydrocannabinol NONE DETECTED NONE DETECTED   Barbiturates NONE DETECTED NONE DETECTED  Ethanol  Result Value Ref Range   Alcohol,  Ethyl (B) <5 <5 mg/dL  Lactic acid, plasma  Result Value Ref Range   Lactic Acid, Venous 0.9 0.5 - 2.0 mmol/L  Lactic acid, plasma  Result Value Ref Range   Lactic Acid, Venous 1.1 0.5 - 2.0 mmol/L  Lipase, blood  Result Value Ref Range   Lipase 33 11 - 51 U/L  Urine microscopic-add on  Result Value Ref Range   Squamous Epithelial / LPF 0-5 (A) NONE SEEN   WBC, UA 0-5 0 - 5 WBC/hpf   RBC / HPF 0-5 0 - 5 RBC/hpf   Bacteria, UA FEW (A) NONE SEEN   Casts HYALINE CASTS (A) NEGATIVE  CBG monitoring, ED  Result Value Ref Range   Glucose-Capillary 238 (H) 65 - 99 mg/dL  POC CBG, ED  Result Value Ref Range   Glucose-Capillary 342 (H) 65 - 99 mg/dL   Ct Abdomen Pelvis Wo Contrast  10/28/2015  CLINICAL DATA:  Generalized weakness and diarrhea for 4-5 weeks. EXAM: CT ABDOMEN AND PELVIS WITHOUT CONTRAST TECHNIQUE: Multidetector CT imaging of the abdomen and pelvis was performed following the standard protocol without IV contrast.  COMPARISON:  Abdominal MRI from 10/02/2015 FINDINGS: Lower chest: Emphysema with underlying chronic interstitial coarsening and basilar atelectasis. Hepatobiliary: No focal abnormality in the liver on this study without intravenous contrast. No evidence of hepatomegaly. There is no evidence for gallstones, gallbladder wall thickening, or pericholecystic fluid. No intrahepatic or extrahepatic biliary dilation. Pancreas: No focal mass lesion. No dilatation of the main duct. No intraparenchymal cyst. No peripancreatic edema. Spleen: No splenomegaly. No focal mass lesion. Adrenals/Urinary Tract: No adrenal nodule or mass. Atherosclerotic calcification is seen and arterial anatomy of the right renal hilum. 3 mm nonobstructing stone is seen in the lower pole of the right kidney. 11 mm water density lesion in the interpolar right kidney is compatible with a cyst as was seen on the previous MRI. 16 mm mildly enhancing right renal lesions seen on previous MRI is difficult to see on today's uninfused CT scan, but appears to measure about 2.2 cm Left kidney is unremarkable on this uninfused exam. No evidence for hydronephrosis. No evidence for hydroureter. Mild bladder wall thickening is evident. Stomach/Bowel: Stomach is nondistended, likely accounting for the apparent wall thickening. Duodenum diverticulum noted. No small bowel dilatation. Small bowel is not well distended which makes small bowel wall thickness difficult to assess, but despite this limitation, there does appear to be some true circumferential wall thickening in the terminal ileum. There may also be some abnormal wall thickening in the cecum, at the level of the ileocecal valve (see image 51 of series 2). The appendix is normal. Abnormal wall thickening is present in the rectum (see images 65 -72 of series 2). Vascular/Lymphatic: There is abdominal aortic atherosclerosis without aneurysm. There is no gastrohepatic or hepatoduodenal ligament lymphadenopathy.  No intraperitoneal or retroperitoneal lymphadenopathy. No pelvic sidewall lymphadenopathy. Reproductive: Prostate gland is unremarkable. Penile prosthesis is evident. Other: No intraperitoneal free fluid. Musculoskeletal: Bone windows reveal no worrisome lytic or sclerotic osseous lesions. IMPRESSION: 1. Apparent circumferential wall thickening in the terminal ileum and cecum at the level of the ileocecal valve. This is associated with more definite wall thickening in the rectum. Given the history of diarrhea, infectious/inflammatory enterocolitis would be a consideration. Neoplasm cannot be excluded. 2. Apparent interval increase in size of the right renal lesion evaluated by previous MRI and and found to be suspicious for possible papillary type renal cell carcinoma at that time. This is difficult to  assess on today's noncontrast exam. Electronically Signed   By: Kennith Center M.D.   On: 10/28/2015 20:15   Dg Chest 1 View  10/28/2015  CLINICAL DATA:  Altered mental status.  Lethargy.  Cough. EXAM: CHEST 1 VIEW COMPARISON:  10/18/2015.  09/27/2015. FINDINGS: Heart size is normal with some left ventricular prominence. There is calcification of the aorta. There is chronic elevation of left hemidiaphragm. There is increased density at the left base when compared to previous studies consistent with left lower lobe pneumonia. No lobar collapse. No effusions. IMPRESSION: Chronically elevated left hemidiaphragm. Newly seen left base infiltrate consistent with bronchopneumonia. Electronically Signed   By: Paulina Fusi M.D.   On: 10/28/2015 15:50   Ct Head Wo Contrast  10/28/2015  CLINICAL DATA:  Generalized weakness and altered mental status. Pt has no complaints of pain or injury Hx: HTN, Diabetes, MI, Chronic fatigue EXAM: CT HEAD WITHOUT CONTRAST TECHNIQUE: Contiguous axial images were obtained from the base of the skull through the vertex without intravenous contrast. COMPARISON:  10/18/2015 FINDINGS: Brain: Mild  atrophy. No evidence of acute infarction, hemorrhage, extra-axial collection, ventriculomegaly, or mass effect. Vascular: No hyperdense vessel or unexpected calcification. Atherosclerotic and physiologic intracranial calcifications. Skull: Negative for fracture or focal lesion. Sinuses/Orbits: No acute findings. Other: None. IMPRESSION: 1. Negative for bleed or other acute intracranial process. Electronically Signed   By: Corlis Leak M.D.   On: 10/28/2015 16:16    Mancel Bale, MD 10/29/15 0003

## 2015-10-28 NOTE — ED Notes (Signed)
EKG completed given to EDP.  

## 2015-10-29 ENCOUNTER — Ambulatory Visit: Payer: Self-pay | Admitting: Internal Medicine

## 2015-10-29 ENCOUNTER — Encounter (HOSPITAL_COMMUNITY): Payer: Self-pay | Admitting: Internal Medicine

## 2015-10-29 ENCOUNTER — Inpatient Hospital Stay (HOSPITAL_COMMUNITY): Payer: Medicare Other

## 2015-10-29 DIAGNOSIS — Z794 Long term (current) use of insulin: Secondary | ICD-10-CM | POA: Diagnosis not present

## 2015-10-29 DIAGNOSIS — E103299 Type 1 diabetes mellitus with mild nonproliferative diabetic retinopathy without macular edema, unspecified eye: Secondary | ICD-10-CM | POA: Diagnosis present

## 2015-10-29 DIAGNOSIS — E860 Dehydration: Secondary | ICD-10-CM | POA: Diagnosis present

## 2015-10-29 DIAGNOSIS — I252 Old myocardial infarction: Secondary | ICD-10-CM | POA: Diagnosis not present

## 2015-10-29 DIAGNOSIS — J188 Other pneumonia, unspecified organism: Secondary | ICD-10-CM | POA: Diagnosis not present

## 2015-10-29 DIAGNOSIS — R569 Unspecified convulsions: Secondary | ICD-10-CM

## 2015-10-29 DIAGNOSIS — E1010 Type 1 diabetes mellitus with ketoacidosis without coma: Secondary | ICD-10-CM | POA: Diagnosis present

## 2015-10-29 DIAGNOSIS — Z7982 Long term (current) use of aspirin: Secondary | ICD-10-CM | POA: Diagnosis not present

## 2015-10-29 DIAGNOSIS — Y95 Nosocomial condition: Secondary | ICD-10-CM | POA: Diagnosis present

## 2015-10-29 DIAGNOSIS — R197 Diarrhea, unspecified: Secondary | ICD-10-CM | POA: Diagnosis not present

## 2015-10-29 DIAGNOSIS — R531 Weakness: Secondary | ICD-10-CM | POA: Diagnosis present

## 2015-10-29 DIAGNOSIS — J690 Pneumonitis due to inhalation of food and vomit: Secondary | ICD-10-CM | POA: Diagnosis present

## 2015-10-29 DIAGNOSIS — E1065 Type 1 diabetes mellitus with hyperglycemia: Secondary | ICD-10-CM | POA: Diagnosis not present

## 2015-10-29 DIAGNOSIS — R630 Anorexia: Secondary | ICD-10-CM | POA: Diagnosis present

## 2015-10-29 DIAGNOSIS — N2889 Other specified disorders of kidney and ureter: Secondary | ICD-10-CM | POA: Diagnosis present

## 2015-10-29 DIAGNOSIS — R748 Abnormal levels of other serum enzymes: Secondary | ICD-10-CM | POA: Diagnosis present

## 2015-10-29 DIAGNOSIS — I2510 Atherosclerotic heart disease of native coronary artery without angina pectoris: Secondary | ICD-10-CM | POA: Diagnosis present

## 2015-10-29 DIAGNOSIS — I959 Hypotension, unspecified: Secondary | ICD-10-CM | POA: Diagnosis not present

## 2015-10-29 DIAGNOSIS — N183 Chronic kidney disease, stage 3 (moderate): Secondary | ICD-10-CM | POA: Diagnosis present

## 2015-10-29 DIAGNOSIS — E785 Hyperlipidemia, unspecified: Secondary | ICD-10-CM | POA: Diagnosis present

## 2015-10-29 DIAGNOSIS — G9340 Encephalopathy, unspecified: Secondary | ICD-10-CM

## 2015-10-29 DIAGNOSIS — E039 Hypothyroidism, unspecified: Secondary | ICD-10-CM | POA: Diagnosis present

## 2015-10-29 DIAGNOSIS — Z951 Presence of aortocoronary bypass graft: Secondary | ICD-10-CM | POA: Diagnosis not present

## 2015-10-29 DIAGNOSIS — J449 Chronic obstructive pulmonary disease, unspecified: Secondary | ICD-10-CM | POA: Diagnosis not present

## 2015-10-29 DIAGNOSIS — F0390 Unspecified dementia without behavioral disturbance: Secondary | ICD-10-CM | POA: Diagnosis present

## 2015-10-29 DIAGNOSIS — N179 Acute kidney failure, unspecified: Secondary | ICD-10-CM | POA: Diagnosis not present

## 2015-10-29 DIAGNOSIS — E1022 Type 1 diabetes mellitus with diabetic chronic kidney disease: Secondary | ICD-10-CM | POA: Diagnosis not present

## 2015-10-29 DIAGNOSIS — G609 Hereditary and idiopathic neuropathy, unspecified: Secondary | ICD-10-CM | POA: Diagnosis present

## 2015-10-29 DIAGNOSIS — R4781 Slurred speech: Secondary | ICD-10-CM | POA: Insufficient documentation

## 2015-10-29 DIAGNOSIS — I10 Essential (primary) hypertension: Secondary | ICD-10-CM | POA: Diagnosis not present

## 2015-10-29 DIAGNOSIS — K5289 Other specified noninfective gastroenteritis and colitis: Secondary | ICD-10-CM | POA: Diagnosis present

## 2015-10-29 DIAGNOSIS — S37099D Other injury of unspecified kidney, subsequent encounter: Secondary | ICD-10-CM | POA: Diagnosis not present

## 2015-10-29 DIAGNOSIS — I129 Hypertensive chronic kidney disease with stage 1 through stage 4 chronic kidney disease, or unspecified chronic kidney disease: Secondary | ICD-10-CM | POA: Diagnosis present

## 2015-10-29 DIAGNOSIS — Z7902 Long term (current) use of antithrombotics/antiplatelets: Secondary | ICD-10-CM | POA: Diagnosis not present

## 2015-10-29 DIAGNOSIS — Z87891 Personal history of nicotine dependence: Secondary | ICD-10-CM | POA: Diagnosis not present

## 2015-10-29 DIAGNOSIS — G40909 Epilepsy, unspecified, not intractable, without status epilepticus: Secondary | ICD-10-CM | POA: Diagnosis present

## 2015-10-29 DIAGNOSIS — E1049 Type 1 diabetes mellitus with other diabetic neurological complication: Secondary | ICD-10-CM | POA: Diagnosis not present

## 2015-10-29 DIAGNOSIS — D6489 Other specified anemias: Secondary | ICD-10-CM | POA: Diagnosis present

## 2015-10-29 LAB — URINE CULTURE: CULTURE: NO GROWTH

## 2015-10-29 LAB — CBC
HCT: 29.1 % — ABNORMAL LOW (ref 39.0–52.0)
Hemoglobin: 9.3 g/dL — ABNORMAL LOW (ref 13.0–17.0)
MCH: 30.4 pg (ref 26.0–34.0)
MCHC: 32 g/dL (ref 30.0–36.0)
MCV: 95.1 fL (ref 78.0–100.0)
Platelets: 254 10*3/uL (ref 150–400)
RBC: 3.06 MIL/uL — ABNORMAL LOW (ref 4.22–5.81)
RDW: 13.1 % (ref 11.5–15.5)
WBC: 7.9 10*3/uL (ref 4.0–10.5)

## 2015-10-29 LAB — BLOOD GAS, ARTERIAL
ACID-BASE DEFICIT: 12.9 mmol/L — AB (ref 0.0–2.0)
BICARBONATE: 13.3 meq/L — AB (ref 20.0–24.0)
DRAWN BY: 449561
FIO2: 0.21
O2 Saturation: 96.1 %
PATIENT TEMPERATURE: 97.8
TCO2: 14.3 mmol/L (ref 0–100)
pCO2 arterial: 33 mmHg — ABNORMAL LOW (ref 35.0–45.0)
pH, Arterial: 7.227 — ABNORMAL LOW (ref 7.350–7.450)
pO2, Arterial: 88.4 mmHg (ref 80.0–100.0)

## 2015-10-29 LAB — BASIC METABOLIC PANEL
ANION GAP: 20 — AB (ref 5–15)
ANION GAP: 13 (ref 5–15)
Anion gap: 19 — ABNORMAL HIGH (ref 5–15)
Anion gap: 12 (ref 5–15)
BUN: 52 mg/dL — AB (ref 6–20)
BUN: 52 mg/dL — ABNORMAL HIGH (ref 6–20)
BUN: 48 mg/dL — AB (ref 6–20)
BUN: 46 mg/dL — AB (ref 6–20)
CALCIUM: 8.6 mg/dL — AB (ref 8.9–10.3)
CHLORIDE: 108 mmol/L (ref 101–111)
CO2: 15 mmol/L — AB (ref 22–32)
CO2: 15 mmol/L — ABNORMAL LOW (ref 22–32)
CO2: 18 mmol/L — AB (ref 22–32)
CO2: 20 mmol/L — AB (ref 22–32)
CREATININE: 2.18 mg/dL — AB (ref 0.61–1.24)
Calcium: 8.5 mg/dL — ABNORMAL LOW (ref 8.9–10.3)
Calcium: 8.4 mg/dL — ABNORMAL LOW (ref 8.9–10.3)
Calcium: 8.7 mg/dL — ABNORMAL LOW (ref 8.9–10.3)
Chloride: 100 mmol/L — ABNORMAL LOW (ref 101–111)
Chloride: 100 mmol/L — ABNORMAL LOW (ref 101–111)
Chloride: 107 mmol/L (ref 101–111)
Creatinine, Ser: 2.69 mg/dL — ABNORMAL HIGH (ref 0.61–1.24)
Creatinine, Ser: 2.74 mg/dL — ABNORMAL HIGH (ref 0.61–1.24)
Creatinine, Ser: 1.94 mg/dL — ABNORMAL HIGH (ref 0.61–1.24)
GFR calc Af Amer: 25 mL/min — ABNORMAL LOW (ref >60)
GFR calc Af Amer: 38 mL/min — ABNORMAL LOW (ref >60)
GFR calc non Af Amer: 28 mL/min — ABNORMAL LOW (ref >60)
GFR, EST AFRICAN AMERICAN: 26 mL/min — AB (ref >60)
GFR, EST AFRICAN AMERICAN: 33 mL/min — AB (ref >60)
GFR, EST NON AFRICAN AMERICAN: 22 mL/min — AB (ref >60)
GFR, EST NON AFRICAN AMERICAN: 22 mL/min — AB (ref >60)
GFR, EST NON AFRICAN AMERICAN: 33 mL/min — AB (ref >60)
GLUCOSE: 488 mg/dL — AB (ref 65–99)
GLUCOSE: 220 mg/dL — AB (ref 65–99)
GLUCOSE: 137 mg/dL — AB (ref 65–99)
Glucose, Bld: 493 mg/dL — ABNORMAL HIGH (ref 65–99)
POTASSIUM: 3.4 mmol/L — AB (ref 3.5–5.1)
POTASSIUM: 3 mmol/L — AB (ref 3.5–5.1)
Potassium: 3.4 mmol/L — ABNORMAL LOW (ref 3.5–5.1)
Potassium: 3.4 mmol/L — ABNORMAL LOW (ref 3.5–5.1)
SODIUM: 135 mmol/L (ref 135–145)
Sodium: 134 mmol/L — ABNORMAL LOW (ref 135–145)
Sodium: 138 mmol/L (ref 135–145)
Sodium: 140 mmol/L (ref 135–145)

## 2015-10-29 LAB — GLUCOSE, CAPILLARY
GLUCOSE-CAPILLARY: 388 mg/dL — AB (ref 65–99)
GLUCOSE-CAPILLARY: 427 mg/dL — AB (ref 65–99)
GLUCOSE-CAPILLARY: 184 mg/dL — AB (ref 65–99)
GLUCOSE-CAPILLARY: 197 mg/dL — AB (ref 65–99)
GLUCOSE-CAPILLARY: 106 mg/dL — AB (ref 65–99)
GLUCOSE-CAPILLARY: 64 mg/dL — AB (ref 65–99)
Glucose-Capillary: 451 mg/dL — ABNORMAL HIGH (ref 65–99)
Glucose-Capillary: 321 mg/dL — ABNORMAL HIGH (ref 65–99)
Glucose-Capillary: 262 mg/dL — ABNORMAL HIGH (ref 65–99)
Glucose-Capillary: 222 mg/dL — ABNORMAL HIGH (ref 65–99)
Glucose-Capillary: 137 mg/dL — ABNORMAL HIGH (ref 65–99)
Glucose-Capillary: 139 mg/dL — ABNORMAL HIGH (ref 65–99)
Glucose-Capillary: 85 mg/dL (ref 65–99)
Glucose-Capillary: 78 mg/dL (ref 65–99)

## 2015-10-29 LAB — GASTROINTESTINAL PANEL BY PCR, STOOL (REPLACES STOOL CULTURE)
ASTROVIRUS: NOT DETECTED
Adenovirus F40/41: NOT DETECTED
CYCLOSPORA CAYETANENSIS: NOT DETECTED
Campylobacter species: NOT DETECTED
Cryptosporidium: NOT DETECTED
E. COLI O157: NOT DETECTED
ENTAMOEBA HISTOLYTICA: NOT DETECTED
ENTEROTOXIGENIC E COLI (ETEC): NOT DETECTED
Enteroaggregative E coli (EAEC): NOT DETECTED
Enteropathogenic E coli (EPEC): NOT DETECTED
Giardia lamblia: NOT DETECTED
Norovirus GI/GII: NOT DETECTED
Plesimonas shigelloides: NOT DETECTED
ROTAVIRUS A: NOT DETECTED
SALMONELLA SPECIES: NOT DETECTED
SAPOVIRUS (I, II, IV, AND V): NOT DETECTED
SHIGA LIKE TOXIN PRODUCING E COLI (STEC): NOT DETECTED
SHIGELLA/ENTEROINVASIVE E COLI (EIEC): NOT DETECTED
VIBRIO CHOLERAE: NOT DETECTED
VIBRIO SPECIES: NOT DETECTED
Yersinia enterocolitica: NOT DETECTED

## 2015-10-29 LAB — HEPATIC FUNCTION PANEL
ALK PHOS: 82 U/L (ref 38–126)
ALT: 20 U/L (ref 17–63)
AST: 19 U/L (ref 15–41)
Albumin: 3 g/dL — ABNORMAL LOW (ref 3.5–5.0)
BILIRUBIN TOTAL: 1.2 mg/dL (ref 0.3–1.2)
Bilirubin, Direct: <0.1 mg/dL — ABNORMAL LOW (ref 0.1–0.5)
Total Protein: 5.6 g/dL — ABNORMAL LOW (ref 6.5–8.1)

## 2015-10-29 LAB — CBG MONITORING, ED
Glucose-Capillary: 535 mg/dL — ABNORMAL HIGH (ref 65–99)
Glucose-Capillary: 484 mg/dL — ABNORMAL HIGH (ref 65–99)

## 2015-10-29 LAB — TYPE AND SCREEN
ABO/RH(D): A POS
Antibody Screen: NEGATIVE

## 2015-10-29 LAB — CORTISOL: Cortisol, Plasma: 13.8 ug/dL

## 2015-10-29 LAB — C DIFFICILE QUICK SCREEN W PCR REFLEX
C DIFFICLE (CDIFF) ANTIGEN: NEGATIVE
C Diff interpretation: NEGATIVE
C Diff toxin: NEGATIVE

## 2015-10-29 LAB — LACTIC ACID, PLASMA: Lactic Acid, Venous: 1.6 mmol/L (ref 0.5–2.0)

## 2015-10-29 LAB — HIV ANTIBODY (ROUTINE TESTING W REFLEX): HIV SCREEN 4TH GENERATION: NONREACTIVE

## 2015-10-29 LAB — TROPONIN I
TROPONIN I: 0.06 ng/mL — AB (ref <0.031)
TROPONIN I: 0.12 ng/mL — AB (ref <0.031)
Troponin I: <0.03 ng/mL (ref <0.031)

## 2015-10-29 LAB — RAPID URINE DRUG SCREEN, HOSP PERFORMED
AMPHETAMINES: NOT DETECTED
BARBITURATES: NOT DETECTED
BENZODIAZEPINES: NOT DETECTED
Cocaine: NOT DETECTED
Opiates: NOT DETECTED
TETRAHYDROCANNABINOL: NOT DETECTED

## 2015-10-29 LAB — SALICYLATE LEVEL

## 2015-10-29 LAB — STREP PNEUMONIAE URINARY ANTIGEN: STREP PNEUMO URINARY ANTIGEN: NEGATIVE

## 2015-10-29 LAB — CREATININE, URINE, RANDOM: Creatinine, Urine: 72.96 mg/dL

## 2015-10-29 LAB — SODIUM, URINE, RANDOM: Sodium, Ur: <10 mmol/L

## 2015-10-29 LAB — MRSA PCR SCREENING: MRSA by PCR: POSITIVE — AB

## 2015-10-29 LAB — PROCALCITONIN: Procalcitonin: 0.19 ng/mL

## 2015-10-29 MED ORDER — DEXTROSE-NACL 5-0.45 % IV SOLN: INTRAVENOUS | Status: DC

## 2015-10-29 MED ORDER — PRAVASTATIN SODIUM 20 MG PO TABS
20.0000 mg | ORAL_TABLET | Freq: Every day | ORAL | Status: DC
  Administered 2015-10-29 – 2015-11-03 (×6): 20 mg via ORAL
  Filled 2015-10-29 (×7): qty 1

## 2015-10-29 MED ORDER — INSULIN REGULAR HUMAN 100 UNIT/ML IJ SOLN
INTRAMUSCULAR | Status: DC
  Administered 2015-10-29: 4.9 [IU]/h via INTRAVENOUS
  Administered 2015-10-29: 3.6 [IU]/h via INTRAVENOUS
  Filled 2015-10-29: qty 2.5

## 2015-10-29 MED ORDER — SODIUM CHLORIDE 0.9 % IV BOLUS (SEPSIS)
1000.0000 mL | Freq: Once | INTRAVENOUS | Status: AC
  Administered 2015-10-29: 1000 mL via INTRAVENOUS

## 2015-10-29 MED ORDER — DEXTROSE-NACL 5-0.45 % IV SOLN
INTRAVENOUS | Status: DC
  Administered 2015-10-29: 11:00:00 via INTRAVENOUS

## 2015-10-29 MED ORDER — DEXTROSE 5 % IV SOLN
1.0000 g | INTRAVENOUS | Status: DC
  Administered 2015-10-29 – 2015-10-31 (×3): 1 g via INTRAVENOUS
  Filled 2015-10-29 (×3): qty 1

## 2015-10-29 MED ORDER — ONDANSETRON HCL 4 MG PO TABS: 4.0000 mg | ORAL_TABLET | Freq: Four times a day (QID) | ORAL | Status: DC | PRN

## 2015-10-29 MED ORDER — LEVETIRACETAM 500 MG PO TABS
1000.0000 mg | ORAL_TABLET | Freq: Two times a day (BID) | ORAL | Status: DC
  Administered 2015-10-29 – 2015-11-02 (×9): 1000 mg via ORAL
  Filled 2015-10-29 (×10): qty 2

## 2015-10-29 MED ORDER — SODIUM CHLORIDE 0.9 % IV SOLN
INTRAVENOUS | Status: DC
  Administered 2015-10-29: 09:00:00 via INTRAVENOUS

## 2015-10-29 MED ORDER — CLOPIDOGREL BISULFATE 75 MG PO TABS
75.0000 mg | ORAL_TABLET | Freq: Every day | ORAL | Status: DC
  Administered 2015-10-29 – 2015-11-04 (×7): 75 mg via ORAL
  Filled 2015-10-29 (×7): qty 1

## 2015-10-29 MED ORDER — LEVOTHYROXINE SODIUM 75 MCG PO TABS: 75.0000 ug | ORAL_TABLET | Freq: Every day | ORAL | Status: DC

## 2015-10-29 MED ORDER — SODIUM CHLORIDE 0.9 % IV SOLN: INTRAVENOUS | Status: DC

## 2015-10-29 MED ORDER — PREGABALIN 50 MG PO CAPS: 50.0000 mg | ORAL_CAPSULE | Freq: Every day | ORAL | Status: DC

## 2015-10-29 MED ORDER — ESCITALOPRAM OXALATE 20 MG PO TABS
20.0000 mg | ORAL_TABLET | Freq: Every day | ORAL | Status: DC
  Administered 2015-10-30 – 2015-11-04 (×6): 20 mg via ORAL
  Filled 2015-10-29: qty 1
  Filled 2015-10-29 (×3): qty 2
  Filled 2015-10-29 (×3): qty 1

## 2015-10-29 MED ORDER — VANCOMYCIN HCL 500 MG IV SOLR
500.0000 mg | INTRAVENOUS | Status: DC
  Administered 2015-10-29: 500 mg via INTRAVENOUS
  Filled 2015-10-29 (×2): qty 500

## 2015-10-29 MED ORDER — SODIUM CHLORIDE 0.9 % IV SOLN
INTRAVENOUS | Status: DC
  Administered 2015-10-29: 6.9 [IU]/h via INTRAVENOUS
  Administered 2015-10-29: 0.3 [IU]/h via INTRAVENOUS
  Filled 2015-10-29: qty 2.5

## 2015-10-29 MED ORDER — BUDESONIDE-FORMOTEROL FUMARATE 160-4.5 MCG/ACT IN AERO
2.0000 | INHALATION_SPRAY | Freq: Two times a day (BID) | RESPIRATORY_TRACT | Status: DC
  Administered 2015-10-29 – 2015-11-04 (×10): 2 via RESPIRATORY_TRACT
  Filled 2015-10-29 (×2): qty 6

## 2015-10-29 MED ORDER — INSULIN REGULAR BOLUS VIA INFUSION
0.0000 [IU] | Freq: Three times a day (TID) | INTRAVENOUS | Status: DC
Start: 2015-10-29 — End: 2015-10-29
  Filled 2015-10-29: qty 10

## 2015-10-29 MED ORDER — ACETAMINOPHEN 650 MG RE SUPP
650.0000 mg | Freq: Four times a day (QID) | RECTAL | Status: DC | PRN
Start: 2015-10-29 — End: 2015-11-04

## 2015-10-29 MED ORDER — PREGABALIN 25 MG PO CAPS
50.0000 mg | ORAL_CAPSULE | Freq: Every day | ORAL | Status: DC
Start: 2015-10-29 — End: 2015-11-04
  Administered 2015-10-30 – 2015-11-04 (×6): 50 mg via ORAL
  Filled 2015-10-29 (×2): qty 2
  Filled 2015-10-29 (×2): qty 1
  Filled 2015-10-29 (×2): qty 2
  Filled 2015-10-29: qty 1

## 2015-10-29 MED ORDER — PREGABALIN 100 MG PO CAPS
100.0000 mg | ORAL_CAPSULE | Freq: Every day | ORAL | Status: DC
  Administered 2015-10-29 – 2015-11-03 (×6): 100 mg via ORAL
  Filled 2015-10-29: qty 1
  Filled 2015-10-29: qty 2
  Filled 2015-10-29 (×3): qty 1
  Filled 2015-10-29: qty 2

## 2015-10-29 MED ORDER — DEXTROSE-NACL 5-0.45 % IV SOLN
INTRAVENOUS | Status: DC
  Administered 2015-10-29 – 2015-10-30 (×3): via INTRAVENOUS

## 2015-10-29 MED ORDER — INSULIN GLARGINE 100 UNIT/ML ~~LOC~~ SOLN
10.0000 [IU] | Freq: Every day | SUBCUTANEOUS | Status: DC
  Administered 2015-10-29: 10 [IU] via SUBCUTANEOUS
  Filled 2015-10-29: qty 0.1

## 2015-10-29 MED ORDER — SACCHAROMYCES BOULARDII 250 MG PO CAPS
250.0000 mg | ORAL_CAPSULE | Freq: Every evening | ORAL | Status: DC
  Administered 2015-10-29 – 2015-11-03 (×6): 250 mg via ORAL
  Filled 2015-10-29 (×7): qty 1

## 2015-10-29 MED ORDER — ONDANSETRON HCL 4 MG/2ML IJ SOLN: 4.0000 mg | Freq: Four times a day (QID) | INTRAMUSCULAR | Status: DC | PRN

## 2015-10-29 MED ORDER — LEVOTHYROXINE SODIUM 75 MCG PO TABS
75.0000 ug | ORAL_TABLET | Freq: Every day | ORAL | Status: DC
  Administered 2015-10-29 – 2015-11-04 (×7): 75 ug via ORAL
  Filled 2015-10-29 (×7): qty 1

## 2015-10-29 MED ORDER — MUPIROCIN 2 % EX OINT
1.0000 "application " | TOPICAL_OINTMENT | Freq: Two times a day (BID) | CUTANEOUS | Status: AC
  Administered 2015-10-29 – 2015-11-02 (×10): 1 via NASAL
  Filled 2015-10-29 (×2): qty 22

## 2015-10-29 MED ORDER — INSULIN ASPART 100 UNIT/ML ~~LOC~~ SOLN: 0.0000 [IU] | Freq: Three times a day (TID) | SUBCUTANEOUS | Status: DC

## 2015-10-29 MED ORDER — CHLORHEXIDINE GLUCONATE CLOTH 2 % EX PADS
6.0000 | MEDICATED_PAD | Freq: Every day | CUTANEOUS | Status: AC
  Administered 2015-10-29 – 2015-11-02 (×5): 6 via TOPICAL

## 2015-10-29 MED ORDER — ACETAMINOPHEN 325 MG PO TABS
650.0000 mg | ORAL_TABLET | Freq: Four times a day (QID) | ORAL | Status: DC | PRN
  Administered 2015-10-29 – 2015-10-30 (×2): 650 mg via ORAL
  Filled 2015-10-29 (×2): qty 2

## 2015-10-29 MED ORDER — VITAMIN B-12 1000 MCG PO TABS
1000.0000 ug | ORAL_TABLET | Freq: Every day | ORAL | Status: DC
  Administered 2015-10-29 – 2015-11-04 (×7): 1000 ug via ORAL
  Filled 2015-10-29 (×7): qty 1

## 2015-10-29 MED ORDER — HEPARIN SODIUM (PORCINE) 5000 UNIT/ML IJ SOLN
5000.0000 [IU] | Freq: Three times a day (TID) | INTRAMUSCULAR | Status: DC
  Administered 2015-10-29 – 2015-11-04 (×19): 5000 [IU] via SUBCUTANEOUS
  Filled 2015-10-29 (×19): qty 1

## 2015-10-29 MED ORDER — SODIUM CHLORIDE 0.9 % IV SOLN
INTRAVENOUS | Status: DC
  Administered 2015-10-29: 06:00:00 via INTRAVENOUS

## 2015-10-29 MED ORDER — POTASSIUM CHLORIDE CRYS ER 20 MEQ PO TBCR
40.0000 meq | EXTENDED_RELEASE_TABLET | Freq: Four times a day (QID) | ORAL | Status: AC
  Administered 2015-10-29 (×2): 40 meq via ORAL
  Filled 2015-10-29 (×2): qty 2

## 2015-10-29 MED ORDER — TIOTROPIUM BROMIDE MONOHYDRATE 18 MCG IN CAPS
18.0000 ug | ORAL_CAPSULE | Freq: Every day | RESPIRATORY_TRACT | Status: DC
  Administered 2015-10-29 – 2015-11-04 (×5): 18 ug via RESPIRATORY_TRACT
  Filled 2015-10-29 (×2): qty 5

## 2015-10-29 MED ORDER — DEXTROSE 50 % IV SOLN: 25.0000 mL | INTRAVENOUS | Status: DC | PRN

## 2015-10-29 MED ORDER — INSULIN ASPART 100 UNIT/ML ~~LOC~~ SOLN
5.0000 [IU] | Freq: Once | SUBCUTANEOUS | Status: AC
  Administered 2015-10-29: 5 [IU] via SUBCUTANEOUS
  Filled 2015-10-29: qty 1

## 2015-10-29 MED ORDER — INSULIN ASPART 100 UNIT/ML ~~LOC~~ SOLN
10.0000 [IU] | Freq: Once | SUBCUTANEOUS | Status: AC
  Administered 2015-10-29: 10 [IU] via SUBCUTANEOUS

## 2015-10-29 NOTE — Progress Notes (Signed)
PT Cancellation Note  Patient Details Name: Benjamin Hodge MRN: [id number] DOB: 12-21-1940   Cancelled Treatment:    Reason Eval/Treat Not Completed: Fatigue/lethargy limiting ability to participate; patient too lethargic to participate per RN.  Will attempt again tomorrow.   Elray Mcgregor 10/29/2015, 1:23 PM  Sheran Lawless, [place] [phone] 10/29/2015

## 2015-10-29 NOTE — Progress Notes (Signed)
Utilization Review Completed.Janira Mandell T2/21/2017  

## 2015-10-29 NOTE — Progress Notes (Signed)
Call received from MD regarding order set.  Discussed switching patient to DKA order set. Order received.    Thanks, Beryl Meager, RN, BC-ADM Inpatient Diabetes Coordinator Pager [phone] (8a-5p)

## 2015-10-29 NOTE — Clinical Social Work Note (Signed)
CSW met with patient's son Glori Luis at [phone] to discuss SNF bed search process.  Patient's son also had questions regarding applying for Medicaid and also switching patient's managed Medicare to regular Medicare.  Patent's son had questions about transferring patient to a SNF for rehab then transitioning to long term care.  CSW spoke to patient about CSW's role and process for starting bed search.  Patient's son was given CSW contact information, and expressed he did not have any more questions currently.  CSW was given permission to begin bed search process, formal assessment to follow.  Jones Broom. [place], MSW, [place] 10/29/2015 5:42 PM

## 2015-10-29 NOTE — H&P (Addendum)
[facility] History and Physical  Benjamin Hodge ZOX:[id number] DOB: [birth year] DOA: 10/28/2015  Referring physician: Dr. Preston Fleeting. PCP: Kimber Relic, MD  Specialists: Dr. Sharl Ma. Endocrinologist.  Chief Complaint: Weakness and loss of consciousness.  HPI: Benjamin Hodge is a 75 y.o. male was admitted last week for pneumonia and uncontrolled diabetes mellitus had followed up with endocrinologist when patient was found very weak and had a syncopal episode at the office. Patient was brought to the ER. Patient is found to be hypotensive and labs reveal acute renal failure. On exam patient also had mild abdominal tenderness and patient states he has been having diarrhea chronically. Denies any nausea vomiting chest pain or shortness of breath but has been having some productive cough and chest x-ray shows persistent infiltrates. Patient's blood sugars slowly increased while in the ER and by the time I examined patient's blood sugar has reached levels of 500. Patient has been placed on IV fluid boluses and empiric antibiotics after blood cultures and insulin for uncontrolled diabetes admitted for further management. On my exam patient appears confused and lethargic. CT head is negative for anything acute. Patient is following commands but lethargic.   Review of Systems: As presented in the history of presenting illness, rest negative.  Past Medical History  Diagnosis Date  . CAD (coronary artery disease)     LAD 40-50% stenosis, first diagonal small 90% stenosis, circumflex 70% stenosis, OM 80% stenosis, right coronary artery 80-90% stenosis.  . Right upper lobe pneumonia 11/28/2008  . Anxiety 07/18/2008  . Irritable bowel syndrome 04/27/2008  . COPD (chronic obstructive pulmonary disease) (HCC) 02/27/2008  . B12 deficiency 02/16/2008    in 5/09: B12 262, MMA 1040  . Chronic diarrhea 01/01/2008    s/p EGD 10/06: H pylori + gastritis, duodenal biopsy normal (Dr. Elnoria Howard); s/p colonoscopy  10/06: hemorrhoids (Dr. Elnoria Howard); evaluated by Dr. Yancey Flemings  in 8/09: tissue transglutaminase AB negative, VIP normal, stool fat content normal, urine 5-HIAA normal; normal GES 11/09 (done for "fluctuating sugars")  . 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 Orthostatic hypotension 02/09/2007  . Hypertension 02/09/2007  . Spinal stenosis in cervical region 05/03    MRI  . Erectile dysfunction 09/27/2006    s/p penile implant  . Anemia, iron deficiency 09/27/2006    neg colonoscopy 2006 - Dr. Elnoria Howard; ferritin 152; hgb  15.7  on 07/07  . Hypothyroidism 09/27/2006    TSH 2.639  07/07  . Hypersomnia 09/27/2006    evaluated by Dr. Jetty Duhamel (5/08) and Dr. Vickey Huger; PSG 8/09: chronic delayed sleep phase syndrome (patient sleeps during the day and is awake at night), nocturnal myoclonus (eval for RLS and IDA suggested). Pt advised to change sleeping behavior  . Diabetes mellitus type II 09/07/1984    poorly controlled, complicated by peripheral neuropathy, microalbuminuria, mild non proliferative retinopathy, s/p DKA 7/05, on insulin pump x 4/09, started a pump vacation on 11/19/2008  . Hyperlipidemia   . 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 Hemorrhoids   . Chronic kidney disease   . Unspecified hereditary and idiopathic peripheral neuropathy   . Other chronic pain   . Other malaise and fatigue   . Depression   . Hypertrophy of prostate without urinary obstruction and other lower urinary tract symptoms (LUTS)   . Lumbago   . Heart attack (HCC) 03/2014    mild  . Renal mass, right: Incentendal finding MRI 10/02/2015 10/03/2015  . Hypogonadism in male 10/03/2015  . Asthma    Past  Surgical History  Procedure Laterality Date  . Penile prosthesis implant    . Tonsillectomy    . Colonoscopy  06/25/2005    Dr. Jeani Hawking  . Left heart catheterization with coronary angiogram [name] 03/13/2014    Procedure: LEFT HEART CATHETERIZATION WITH CORONARY ANGIOGRAM;  Surgeon: Peter M Swaziland,  MD;  Location: [facility] CATH LAB;  Service: Cardiovascular;  Laterality: [name];  . Peripheral vascular catheterization [name] 02/19/2015    Procedure: Abdominal Aortogram;  Surgeon: Nada Libman, MD;  Location: [facility] INVASIVE CV LAB;  Service: Cardiovascular;  Laterality: [name];  . Peripheral vascular catheterization Left 02/19/2015    Procedure: Lower Extremity Angiography;  Surgeon: Nada Libman, MD;  Location: [facility] INVASIVE CV LAB;  Service: Cardiovascular;  Laterality: Left;   Social History:  reports that he quit smoking about 11 years ago. His smoking use included Cigarettes. He has never used smokeless tobacco. He reports that he drinks alcohol. He reports that he does not use illicit drugs. Where does patient live home. Can patient participate in ADLs? Yes.  Allergies  Allergen Reactions  . Ace Inhibitors Other (See Comments)    dizzy  . Cymbalta [Duloxetine Hcl] Other (See Comments)    dizzy  . Lipitor [Atorvastatin] Other (See Comments)    Caused pain all over body.  . Bee Venom Swelling  . Glucerna [Nutritional Supplements] Diarrhea  . Angiotensin Receptor Blockers Other (See Comments)    unknown    Family History:  Family History  Problem Relation Age of Onset  . Bone cancer Mother   . Breast cancer Mother   . Cancer Mother   . Lung cancer Father   . Cancer Father   . Breast cancer Sister   . Cancer Sister   . Lung cancer Brother   . Cancer Sister     type unknown      Prior to Admission medications   Medication Sig Start Date End Date Taking? Authorizing Provider  aspirin 81 MG tablet Take 81 mg by mouth at bedtime.    Yes Historical Provider, MD  budesonide-formoterol (SYMBICORT) 160-4.5 MCG/ACT inhaler Inhale 2 puffs into the lungs 2 (two) times daily. 10/03/15  Yes Rodolph Bong, MD  clopidogrel (PLAVIX) 75 MG tablet TAKE 1 TABLET BY MOUTH DAILY WITH BREAKFAST 06/11/15  Yes Kimber Relic, MD  escitalopram (LEXAPRO) 20 MG tablet TAKE ONE TABLET BY MOUTH ONE TIME DAILY  08/07/15  Yes Kimber Relic, MD  Insulin Glargine (TOUJEO SOLOSTAR) 300 UNIT/ML SOPN Inject 11 Units into the skin at bedtime.    Yes Historical Provider, MD  insulin lispro (HUMALOG KWIKPEN) 100 UNIT/ML KiwkPen Inject 4-9 Units into the skin 3 (three) times daily. bs 70-159=4 units, 160-219=5 units, 220-279=6 units, 280-339=7 units, 340-399=8 units, if bs >400=9 units   Yes Historical Provider, MD  levETIRAcetam (KEPPRA) 1000 MG tablet Take 1 tablet (1,000 mg total) by mouth 2 (two) times daily. For seizures 10/22/15  Yes Charlestine Night, PA-C  levothyroxine (SYNTHROID, LEVOTHROID) 75 MCG tablet Take 1 tablet (75 mcg total) by mouth daily. 05/10/15  Yes Kimber Relic, MD  metoprolol tartrate (LOPRESSOR) 25 MG tablet Take One tablet by mouth twice daily to control BP Patient taking differently: Take 25 mg by mouth 2 (two) times daily. Take One tablet by mouth twice daily to control BP 09/23/15  Yes Kimber Relic, MD  Multiple Vitamin (MULTIVITAMIN WITH MINERALS) TABS tablet Take 1 tablet by mouth daily. 02/22/15  Yes Osvaldo Shipper, MD  pantoprazole (  PROTONIX) 40 MG tablet TAKE ONE TABLET BY MOUTH ONE TIME DAILY 05/14/15  Yes Kimber Relic, MD  pravastatin (PRAVACHOL) 20 MG tablet Take 1 tablet (20 mg total) by mouth every evening. NEEDS APPOINTMENT FOR FUTURE REFILLS OR 90-DAY SUPPLY Patient taking differently: Take 20 mg by mouth daily.  09/07/15  Yes Rollene Rotunda, MD  pregabalin (LYRICA) 50 MG capsule Take one capsule by mouth in the morning and two capsules by mouth in the evening for pains. Patient taking differently: Take 50-100 mg by mouth daily. Take one capsule by mouth in the morning and two capsules by mouth in the evening for pains. 08/14/15  Yes Kimber Relic, MD  saccharomyces boulardii (FLORASTOR) 250 MG capsule Take 1 capsule (250 mg total) by mouth every evening. 10/21/15  Yes Hollice Espy, MD  tiotropium (SPIRIVA) 18 MCG inhalation capsule Place 1 capsule (18 mcg total) into  inhaler and inhale daily. 10/03/15  Yes Rodolph Bong, MD  vitamin B-12 (CYANOCOBALAMIN) 1000 MCG tablet Take 1,000 mcg by mouth daily.   Yes Historical Provider, MD  diphenoxylate-atropine (LOMOTIL) 2.5-0.025 MG tablet One up to 4 times daily if needed for diarrhea Patient not taking: Reported on 10/22/2015 10/16/15   Kimber Relic, MD  glucose blood (ONE TOUCH TEST STRIPS) test strip 1 each by Other route 4 (four) times daily. Use as instructed    Historical Provider, MD  insulin aspart (NOVOLOG) 100 UNIT/ML injection Inject 3 Units into the skin 3 (three) times daily with meals. Patient not taking: Reported on 10/22/2015 10/21/15   Hollice Espy, MD  insulin aspart (NOVOLOG) 100 UNIT/ML injection Sliding Scale: If BS 70-159=4 units, 160-215=5 units, 220-279=6 units, 280-339=7 units, 340-399=8 units. If greater than 400 give 9 units Patient not taking: Reported on 10/22/2015 10/21/15   Hollice Espy, MD  insulin aspart (NOVOLOG) 100 UNIT/ML injection Sliding Scale: If BS 70-199=0 units, 200-249=2 units, 250-299=3 units, 300-349=4 units, 350-399=5 units. If greater than 400 give 6 units Patient not taking: Reported on 10/22/2015 10/21/15   Hollice Espy, MD    Physical Exam: Filed Vitals:   10/28/15 2115 10/28/15 2130 10/28/15 2330 10/29/15 0045  BP: 121/64 112/56 94/50 70/51   Pulse: 64 63 63 66  Temp:      TempSrc:      Resp: 16 12 17 14   Height:      Weight:      SpO2: 97% 97% 97% 99%     General:  Moderately built and nourished.  Eyes: Anicteric no pallor.  ENT: No discharge from the ears eyes nose or mouth.  Neck: No mass felt. No JVD appreciated.  Cardiovascular: S1-S2 heard.  Respiratory: No rhonchi or crepitations.  Abdomen: Soft nontender bowel sounds present.  Skin: No rash.  Musculoskeletal: No edema.  Psychiatric: Appears lethargic.  Neurologic: Appears lethargic. But follows commands and is oriented to time place and person. Moves all  extremities.  Labs on Admission:  Basic Metabolic Panel:  Recent Labs Lab 10/22/15 1449 10/28/15 1440  NA 138 136  K 4.8 4.2  CL 102 97*  CO2 23 22  GLUCOSE 397* 265*  BUN 27* 52*  CREATININE 1.81* 2.36*  CALCIUM 9.6 9.8   Liver Function Tests:  Recent Labs Lab 10/28/15 1440  AST 25  ALT 22  ALKPHOS 87  BILITOT 0.3  PROT 6.9  ALBUMIN 3.5    Recent Labs Lab 10/28/15 1440  LIPASE 33   No results for input(s): AMMONIA in the  last 168 hours. CBC:  Recent Labs Lab 10/22/15 1449 10/28/15 1440  WBC 7.6 8.9  NEUTROABS 5.4 6.0  HGB 10.2* 9.2*  HCT 30.8* 26.7*  MCV 95.7 93.0  PLT 209 245   Cardiac Enzymes: No results for input(s): CKTOTAL, CKMB, CKMBINDEX, TROPONINI in the last 168 hours.  BNP (last 3 results)  Recent Labs  10/20/15 1043  BNP 280.3*    ProBNP (last 3 results) No results for input(s): PROBNP in the last 8760 hours.  CBG:  Recent Labs Lab 10/22/15 1136 10/28/15 1430 10/28/15 1815 10/28/15 2336 10/29/15 0101  GLUCAP 131* 238* 342* 518* 535*    Radiological Exams on Admission: Ct Abdomen Pelvis Wo Contrast  10/28/2015  CLINICAL DATA:  Generalized weakness and diarrhea for 4-5 weeks. EXAM: CT ABDOMEN AND PELVIS WITHOUT CONTRAST TECHNIQUE: Multidetector CT imaging of the abdomen and pelvis was performed following the standard protocol without IV contrast. COMPARISON:  Abdominal MRI from 10/02/2015 FINDINGS: Lower chest: Emphysema with underlying chronic interstitial coarsening and basilar atelectasis. Hepatobiliary: No focal abnormality in the liver on this study without intravenous contrast. No evidence of hepatomegaly. There is no evidence for gallstones, gallbladder wall thickening, or pericholecystic fluid. No intrahepatic or extrahepatic biliary dilation. Pancreas: No focal mass lesion. No dilatation of the main duct. No intraparenchymal cyst. No peripancreatic edema. Spleen: No splenomegaly. No focal mass lesion. Adrenals/Urinary  Tract: No adrenal nodule or mass. Atherosclerotic calcification is seen and arterial anatomy of the right renal hilum. 3 mm nonobstructing stone is seen in the lower pole of the right kidney. 11 mm water density lesion in the interpolar right kidney is compatible with a cyst as was seen on the previous MRI. 16 mm mildly enhancing right renal lesions seen on previous MRI is difficult to see on today's uninfused CT scan, but appears to measure about 2.2 cm Left kidney is unremarkable on this uninfused exam. No evidence for hydronephrosis. No evidence for hydroureter. Mild bladder wall thickening is evident. Stomach/Bowel: Stomach is nondistended, likely accounting for the apparent wall thickening. Duodenum diverticulum noted. No small bowel dilatation. Small bowel is not well distended which makes small bowel wall thickness difficult to assess, but despite this limitation, there does appear to be some true circumferential wall thickening in the terminal ileum. There may also be some abnormal wall thickening in the cecum, at the level of the ileocecal valve (see image 51 of series 2). The appendix is normal. Abnormal wall thickening is present in the rectum (see images 65 -72 of series 2). Vascular/Lymphatic: There is abdominal aortic atherosclerosis without aneurysm. There is no gastrohepatic or hepatoduodenal ligament lymphadenopathy. No intraperitoneal or retroperitoneal lymphadenopathy. No pelvic sidewall lymphadenopathy. Reproductive: Prostate gland is unremarkable. Penile prosthesis is evident. Other: No intraperitoneal free fluid. Musculoskeletal: Bone windows reveal no worrisome lytic or sclerotic osseous lesions. IMPRESSION: 1. Apparent circumferential wall thickening in the terminal ileum and cecum at the level of the ileocecal valve. This is associated with more definite wall thickening in the rectum. Given the history of diarrhea, infectious/inflammatory enterocolitis would be a consideration. Neoplasm  cannot be excluded. 2. Apparent interval increase in size of the right renal lesion evaluated by previous MRI and and found to be suspicious for possible papillary type renal cell carcinoma at that time. This is difficult to assess on today's noncontrast exam. Electronically Signed   By: Kennith Center M.D.   On: 10/28/2015 20:15   Dg Chest 1 View  10/28/2015  CLINICAL DATA:  Altered mental status.  Lethargy.  Cough. EXAM: CHEST 1 VIEW COMPARISON:  10/18/2015.  09/27/2015. FINDINGS: Heart size is normal with some left ventricular prominence. There is calcification of the aorta. There is chronic elevation of left hemidiaphragm. There is increased density at the left base when compared to previous studies consistent with left lower lobe pneumonia. No lobar collapse. No effusions. IMPRESSION: Chronically elevated left hemidiaphragm. Newly seen left base infiltrate consistent with bronchopneumonia. Electronically Signed   By: Paulina Fusi M.D.   On: 10/28/2015 15:50   Ct Head Wo Contrast  10/28/2015  CLINICAL DATA:  Generalized weakness and altered mental status. Pt has no complaints of pain or injury Hx: HTN, Diabetes, MI, Chronic fatigue EXAM: CT HEAD WITHOUT CONTRAST TECHNIQUE: Contiguous axial images were obtained from the base of the skull through the vertex without intravenous contrast. COMPARISON:  10/18/2015 FINDINGS: Brain: Mild atrophy. No evidence of acute infarction, hemorrhage, extra-axial collection, ventriculomegaly, or mass effect. Vascular: No hyperdense vessel or unexpected calcification. Atherosclerotic and physiologic intracranial calcifications. Skull: Negative for fracture or focal lesion. Sinuses/Orbits: No acute findings. Other: None. IMPRESSION: 1. Negative for bleed or other acute intracranial process. Electronically Signed   By: Corlis Leak M.D.   On: 10/28/2015 16:16    EKG: Independently reviewed. Sinus bradycardia with rate around 57 bpm with nonspecific ST  changes.  Assessment/Plan Principal Problem:   AKI (acute kidney injury) (HCC) Active Problems:   Type 1 diabetes mellitus with neurological manifestations, uncontrolled (HCC)   Essential hypertension   COPD (chronic obstructive pulmonary disease) (HCC)   CAD- moderate3V disease at cath 7/15- medical Rx   Seizures (HCC)   Dehydration   Diarrhea   Acute encephalopathy   Hypotension   1. Acute on chronic renal disease stage III - Probably secondary to hypotension and poor oral intake and diarrhea. Check FENa. Urine analysis is not show any casts. Closely follow intake output metabolic panel and continue with hydration. 2. Uncontrolled diabetes mellitus type 1 possibly early DKA - I have ordered patient's home dose of Lantus with sliding scale coverage. I have ordered repeat metabolic panel and ABG stat. If metabolic panel shows elevated anion gap ABG shows acidosis will start IV insulin infusion. 3. Abdominal tenderness with diarrhea with CAT scan showing circumferential wall thickening around the terminal ileum and rectum - probably secondary to diarrhea. Check stool studies C. difficile and GI pathogen panel. May need GI consult in a.m. Lactic acid is normal. 4. Acute encephalopathy could be from metabolic reasons - cause not clear. Patient had similar picture last time. We will check urine drug studies ammonia levels EEG and continue Keppra. ABGs pending. 5. Hypotension - probably from dehydration and possible diabetic ketoacidosis. Patient does not look septic. Follow blood cultures procalcitonin levels and at this time patient is on empiric antibiotics for pneumonia. Continue with hydration and hold antihypertensives. Check cortisol levels. 6. Pneumonia - probably from aspiration. Continue with vancomycin and cefepime. 7. CAD status post CABG - denies any chest pain now or since patient is hypotensive we will cycle cardiac markers. 8. Seizure disorder - see #3. Continue Keppra for  now. 9. COPD - presently not wheezing. Continue inhalers. Check ABG. 10. Chronic anemia - follow CBC. 11. Hypertension presently hypotensive holding antihypertensives.   DVT Prophylaxis Lovenox.  Code Status: Full code.  Family Communication: No family at the bedside.  Disposition Plan: Admit to inpatient.    Tonetta Napoles N. [facility] Pager [phone].  If 7PM-7AM, please contact night-coverage [email or web address] Password [email or web address] 10/29/2015, 1:56 AM

## 2015-10-29 NOTE — Procedures (Signed)
ELECTROENCEPHALOGRAM REPORT  Date of Study: 10/29/2015  Patient's Name: Benjamin Hodge MRN: [id number] Date of Birth: [birth year]  Referring Provider: Dr. Midge Minium  Clinical History: This is a 75 year old man with altered mental status  Medications: levETIRAcetam (KEPPRA) tablet 1,000 mg pregabalin (LYRICA) capsule 100 mg acetaminophen (TYLENOL) tablet 650 mg budesonide-formoterol (SYMBICORT) 160-4.5 MCG/ACT inhaler 2 puff ceFEPIme (MAXIPIME) 1 g in dextrose 5 % 50 mL IVPB clopidogrel (PLAVIX) tablet 75 mg escitalopram (LEXAPRO) tablet 20 mg levothyroxine (SYNTHROID, LEVOTHROID) tablet 75 mcg vancomycin (VANCOCIN) 500 mg in sodium chloride 0.9 % 100 mL IVPB vitamin B-12 (CYANOCOBALAMIN) tablet 1,000 mcg  Technical Summary: A multichannel digital EEG recording measured by the international 10-20 system with electrodes applied with paste and impedances below 5000 ohms performed as portable with EKG monitoring in an awake and drowsy patient.  Hyperventilation was not performed. Photic stimulation was performed.  The digital EEG was referentially recorded, reformatted, and digitally filtered in a variety of bipolar and referential montages for optimal display.   Description: The patient is awake and drowsy during the recording.  There is no clear posterior dominant rhythm. The background consists of a large amount of diffuse 4-5 Hz theta and 2-3 Hz delta slowing.  During drowsiness, there is a slight increase in theta and delta slowing of the background. Normal sleep architecture is not seen. Photic stimulation did not elicit any abnormalities.  There were no epileptiform discharges or electrographic seizures seen.    EKG lead showed irregular rhythm.  Impression: This awake and drowsy EEG is abnormal due to moderate diffuse slowing of the waking background.  Clinical Correlation of the above findings indicates diffuse cerebral dysfunction that is non-specific in etiology  and can be seen with hypoxic/ischemic injury, toxic/metabolic encephalopathies, or medication effect.  The absence of epileptiform discharges does not rule out a clinical diagnosis of epilepsy.  Clinical correlation is advised.   Patrcia Dolly, M.D.

## 2015-10-29 NOTE — Progress Notes (Signed)
[facility] PROGRESS NOTE   Benjamin Hodge ZOX:[id number] DOB: 06/30/1941 DOA: 10/28/2015 PCP: Kimber Relic, MD  HPI/Subjective: Feels okay, denies complaints.  Assessment/Plan: Principal Problem:   AKI (acute kidney injury) (HCC) Active Problems:   Type 1 diabetes mellitus with neurological manifestations, uncontrolled (HCC)   Essential hypertension   COPD (chronic obstructive pulmonary disease) (HCC)   CAD- moderate3V disease at cath 7/15- medical Rx   Seizures (HCC)   Dehydration   Diarrhea   Acute encephalopathy   Hypotension    Patient seen earlier today by my colleague Dr. Toniann Fail. This is a no charge note, I have seen the patient, examined him and reviewed the chart and data base. Came in with acute renal failure on CKD stage III secondary to dehydration continue IV fluids. Pneumonia, likely from aspiration, on antibiotics. DKA, patient is on DKA protocol.  Code Status: Full Code Family Communication: Plan discussed with the patient. Disposition Plan: Remains inpatient Diet: Diet NPO time specified  Consultants:  None  Procedures:  None  Antibiotics:  None (indicate start date, and stop date if known)   Objective: Filed Vitals:   10/29/15 1100 10/29/15 1200  BP: 92/50 109/55  Pulse: 59 54  Temp: 97.7 F (36.5 C)   Resp: 12 13    Intake/Output Summary (Last 24 hours) at 10/29/15 1403 Last data filed at 10/29/15 1300  Gross per 24 hour  Intake 566.67 ml  Output      8 ml  Net 558.67 ml   Filed Weights   10/28/15 1419 10/29/15 0545  Weight: 57.153 kg (126 lb) 57.8 kg (127 lb 6.8 oz)    Exam: General: Alert and awake, oriented x3, not in any acute distress. HEENT: anicteric sclera, pupils reactive to light and accommodation, EOMI CVS: S1-S2 clear, no murmur rubs or gallops Chest: clear to auscultation bilaterally, no wheezing, rales or rhonchi Abdomen: soft nontender, nondistended, normal bowel sounds, no  organomegaly Extremities: no cyanosis, clubbing or edema noted bilaterally Neuro: Cranial nerves II-XII intact, no focal neurological deficits  Data Reviewed: Basic Metabolic Panel:  Recent Labs Lab 10/22/15 1449 10/28/15 1440 10/29/15 0721 10/29/15 1155  NA 138 136 135  134* 138  K 4.8 4.2 3.4*  3.4* 3.4*  CL 102 97* 100*  100* 108  CO2 23 22 15*  15* 18*  GLUCOSE 397* 265* 493*  488* 220*  BUN 27* 52* 52*  52* 48*  CREATININE 1.81* 2.36* 2.69*  2.74* 2.18*  CALCIUM 9.6 9.8 8.6*  8.5* 8.4*   Liver Function Tests:  Recent Labs Lab 10/28/15 1440 10/29/15 0721  AST 25 19  ALT 22 20  ALKPHOS 87 82  BILITOT 0.3 1.2  PROT 6.9 5.6*  ALBUMIN 3.5 3.0*    Recent Labs Lab 10/28/15 1440  LIPASE 33   No results for input(s): AMMONIA in the last 168 hours. CBC:  Recent Labs Lab 10/22/15 1449 10/28/15 1440 10/29/15 0721  WBC 7.6 8.9 7.9  NEUTROABS 5.4 6.0  --   HGB 10.2* 9.2* 9.3*  HCT 30.8* 26.7* 29.1*  MCV 95.7 93.0 95.1  PLT 209 245 254   Cardiac Enzymes:  Recent Labs Lab 10/29/15 0721 10/29/15 1155  TROPONINI <0.03 0.06*   BNP (last 3 results)  Recent Labs  10/20/15 1043  BNP 280.3*    ProBNP (last 3 results) No results for input(s): PROBNP in the last 8760 hours.  CBG:  Recent Labs Lab 10/28/15 2336 10/29/15 0101 10/29/15 0407 10/29/15 8119 10/29/15 1478  GLUCAP 518* 535* 484* 451* 388*    Micro Recent Results (from the past 240 hour(s))  Urine culture     Status: None (Preliminary result)   Collection Time: 10/28/15  6:07 PM  Result Value Ref Range Status   Specimen Description URINE, CATHETERIZED  Final   Special Requests NONE  Final   Culture NO GROWTH < 24 HOURS  Final   Report Status PENDING  Incomplete  MRSA PCR Screening     Status: Abnormal   Collection Time: 10/29/15  5:32 AM  Result Value Ref Range Status   MRSA by PCR POSITIVE (A) NEGATIVE Final    Comment:        The GeneXpert MRSA Assay (FDA approved for  NASAL specimens only), is one component of a comprehensive MRSA colonization surveillance program. It is not intended to diagnose MRSA infection nor to guide or monitor treatment for MRSA infections. RESULT CALLED TO, READ BACK BY AND VERIFIED WITH: S. MOORE RN 10/29/15 AT 0815 BY A. DAVIS   C difficile quick scan w PCR reflex     Status: None   Collection Time: 10/29/15  7:30 AM  Result Value Ref Range Status   C Diff antigen NEGATIVE NEGATIVE Final   C Diff toxin NEGATIVE NEGATIVE Final   C Diff interpretation Negative for toxigenic C. difficile  Final     Studies: Ct Abdomen Pelvis Wo Contrast  10/28/2015  CLINICAL DATA:  Generalized weakness and diarrhea for 4-5 weeks. EXAM: CT ABDOMEN AND PELVIS WITHOUT CONTRAST TECHNIQUE: Multidetector CT imaging of the abdomen and pelvis was performed following the standard protocol without IV contrast. COMPARISON:  Abdominal MRI from 10/02/2015 FINDINGS: Lower chest: Emphysema with underlying chronic interstitial coarsening and basilar atelectasis. Hepatobiliary: No focal abnormality in the liver on this study without intravenous contrast. No evidence of hepatomegaly. There is no evidence for gallstones, gallbladder wall thickening, or pericholecystic fluid. No intrahepatic or extrahepatic biliary dilation. Pancreas: No focal mass lesion. No dilatation of the main duct. No intraparenchymal cyst. No peripancreatic edema. Spleen: No splenomegaly. No focal mass lesion. Adrenals/Urinary Tract: No adrenal nodule or mass. Atherosclerotic calcification is seen and arterial anatomy of the right renal hilum. 3 mm nonobstructing stone is seen in the lower pole of the right kidney. 11 mm water density lesion in the interpolar right kidney is compatible with a cyst as was seen on the previous MRI. 16 mm mildly enhancing right renal lesions seen on previous MRI is difficult to see on today's uninfused CT scan, but appears to measure about 2.2 cm Left kidney is  unremarkable on this uninfused exam. No evidence for hydronephrosis. No evidence for hydroureter. Mild bladder wall thickening is evident. Stomach/Bowel: Stomach is nondistended, likely accounting for the apparent wall thickening. Duodenum diverticulum noted. No small bowel dilatation. Small bowel is not well distended which makes small bowel wall thickness difficult to assess, but despite this limitation, there does appear to be some true circumferential wall thickening in the terminal ileum. There may also be some abnormal wall thickening in the cecum, at the level of the ileocecal valve (see image 51 of series 2). The appendix is normal. Abnormal wall thickening is present in the rectum (see images 65 -72 of series 2). Vascular/Lymphatic: There is abdominal aortic atherosclerosis without aneurysm. There is no gastrohepatic or hepatoduodenal ligament lymphadenopathy. No intraperitoneal or retroperitoneal lymphadenopathy. No pelvic sidewall lymphadenopathy. Reproductive: Prostate gland is unremarkable. Penile prosthesis is evident. Other: No intraperitoneal free fluid. Musculoskeletal: Bone windows reveal no worrisome  lytic or sclerotic osseous lesions. IMPRESSION: 1. Apparent circumferential wall thickening in the terminal ileum and cecum at the level of the ileocecal valve. This is associated with more definite wall thickening in the rectum. Given the history of diarrhea, infectious/inflammatory enterocolitis would be a consideration. Neoplasm cannot be excluded. 2. Apparent interval increase in size of the right renal lesion evaluated by previous MRI and and found to be suspicious for possible papillary type renal cell carcinoma at that time. This is difficult to assess on today's noncontrast exam. Electronically Signed   By: Kennith Center M.D.   On: 10/28/2015 20:15   Dg Chest 1 View  10/28/2015  CLINICAL DATA:  Altered mental status.  Lethargy.  Cough. EXAM: CHEST 1 VIEW COMPARISON:  10/18/2015.   09/27/2015. FINDINGS: Heart size is normal with some left ventricular prominence. There is calcification of the aorta. There is chronic elevation of left hemidiaphragm. There is increased density at the left base when compared to previous studies consistent with left lower lobe pneumonia. No lobar collapse. No effusions. IMPRESSION: Chronically elevated left hemidiaphragm. Newly seen left base infiltrate consistent with bronchopneumonia. Electronically Signed   By: Paulina Fusi M.D.   On: 10/28/2015 15:50   Ct Head Wo Contrast  10/28/2015  CLINICAL DATA:  Generalized weakness and altered mental status. Pt has no complaints of pain or injury Hx: HTN, Diabetes, MI, Chronic fatigue EXAM: CT HEAD WITHOUT CONTRAST TECHNIQUE: Contiguous axial images were obtained from the base of the skull through the vertex without intravenous contrast. COMPARISON:  10/18/2015 FINDINGS: Brain: Mild atrophy. No evidence of acute infarction, hemorrhage, extra-axial collection, ventriculomegaly, or mass effect. Vascular: No hyperdense vessel or unexpected calcification. Atherosclerotic and physiologic intracranial calcifications. Skull: Negative for fracture or focal lesion. Sinuses/Orbits: No acute findings. Other: None. IMPRESSION: 1. Negative for bleed or other acute intracranial process. Electronically Signed   By: Corlis Leak M.D.   On: 10/28/2015 16:16    Scheduled Meds: . budesonide-formoterol  2 puff Inhalation BID  . ceFEPime (MAXIPIME) IV  1 g Intravenous Q24H  . Chlorhexidine Gluconate Cloth  6 each Topical Q0600  . clopidogrel  75 mg Oral Q breakfast  . escitalopram  20 mg Oral Daily  . heparin  5,000 Units Subcutaneous 3 times per day  . levETIRAcetam  1,000 mg Oral BID  . levothyroxine  75 mcg Oral QAC breakfast  . mupirocin ointment  1 application Nasal BID  . pravastatin  20 mg Oral q1800  . pregabalin  100 mg Oral QHS  . pregabalin  50 mg Oral Daily  . saccharomyces boulardii  250 mg Oral QPM  .  tiotropium  18 mcg Inhalation Daily  . vancomycin  500 mg Intravenous Q24H  . vitamin B-12  1,000 mcg Oral Daily   Continuous Infusions: . sodium chloride 125 mL/hr at 10/29/15 0600  . sodium chloride    . dextrose 5 % and 0.45% NaCl    . insulin (NOVOLIN-R) infusion 6.9 Units/hr (10/29/15 1323)       Time spent: 35 minutes    [facility]  [facility] Pager [phone] If 7PM-7AM, please contact night-coverage at [email or web address], password [facility] 10/29/2015, 2:03 PM  LOS: 0 days

## 2015-10-29 NOTE — Progress Notes (Signed)
Bedside EEG completed, results pending. 

## 2015-10-29 NOTE — Progress Notes (Signed)
Inpatient Diabetes Program Recommendations  AACE/ADA: New Consensus Statement on Inpatient Glycemic Control (2015)  Target Ranges:  Prepandial:   less than 140 mg/dL      Peak postprandial:   less than 180 mg/dL (1-2 hours)      Critically ill patients:  140 - 180 mg/dL   Review of Glycemic Control:  Results for MICHA, VANVLIET (MRN [id number]) as of 10/29/2015 09:15  Ref. Range 10/28/2015 23:36 10/29/2015 01:01 10/29/2015 04:07 10/29/2015 06:23 10/29/2015 08:04  Glucose-Capillary Latest Ref Range: 65-99 mg/dL 131 (H) 438 (H) 887 (H) 451 (H) 388 (H)   Diabetes history: Type 1 diabetes Outpatient Diabetes medications: Toujeo 11 units daily, Humalog:  70-159-4 units, 160-219-5 units, 220-279- 6 units, 280-339-7 units, 340-399-8 units, if BS>400 then 9 units. Current orders for Inpatient glycemic control:  IV insulin/GlucoStabilizer order set.  Note that patient received Lantus 10 units at 0201 and Novolog 5 units at 0159 and Novolog 10 units at 0645.   Inpatient Diabetes Program Recommendations:    Sent text page to Dr. Arthor Captain regarding possibly switching patient to DKA order set due to low CO2 and elevated Anion GAP.   Will follow.  Thanks, Beryl Meager, RN, BC-ADM Inpatient Diabetes Coordinator Pager [phone] (8a-5p)

## 2015-10-29 NOTE — Progress Notes (Signed)
Pharmacy Antibiotic Note  Benjamin Hodge is a 75 y.o. male admitted on 10/28/2015 with pneumonia.  Pharmacy has been consulted for cefepime and vancomycin dosing.  Pt received vancomycin 1g and zosyn 3.375g IV once in the ED.  Plan: Vancomycin 500mg  IV every 24 hours.  Goal trough 15-20 mcg/mL. Cefepime 1g IV q24h  Monitor culture data, renal function and clinical course VT at SS prn  Height: 5\' 6"  (167.6 cm) Weight: 126 lb (57.153 kg) IBW/kg (Calculated) : 63.8  Temp (24hrs), Avg:97.5 F (36.4 C), Min:97 F (36.1 C), Max:97.9 F (36.6 C)   Recent Labs Lab 10/22/15 1449 10/28/15 1440 10/28/15 1846 10/28/15 2013  WBC 7.6 8.9  --   --   CREATININE 1.81* 2.36*  --   --   LATICACIDVEN  --   --  0.9 1.1    Estimated Creatinine Clearance: 22.9 mL/min (by C-G formula based on Cr of 2.36).    Allergies  Allergen Reactions  . Ace Inhibitors Other (See Comments)    dizzy  . Cymbalta [Duloxetine Hcl] Other (See Comments)    dizzy  . Lipitor [Atorvastatin] Other (See Comments)    Caused pain all over body.  . Bee Venom Swelling  . Glucerna [Nutritional Supplements] Diarrhea  . Angiotensin Receptor Blockers Other (See Comments)    unknown    Antimicrobials this admission: Cefepime 2/21 >>  Vanc 2/21 >>  Zosyn 2/21  Dose adjustments this admission: n/a  Microbiology results: 2/21 BCx: sent 2/21 UCx: sent   Sputum:    MRSA PCR:    Arlean Hopping. Newman Pies, PharmD, BCPS Clinical Pharmacist Pager [phone] 10/29/2015 5:09 AM

## 2015-10-29 NOTE — Progress Notes (Signed)
Inpatient Diabetes Program Recommendations  AACE/ADA: New Consensus Statement on Inpatient Glycemic Control (2015)  Target Ranges:  Prepandial:   less than 140 mg/dL      Peak postprandial:   less than 180 mg/dL (1-2 hours)      Critically ill patients:  140 - 180 mg/dL   Review of Glycemic Control:  Note patient admitted on 10/29/15-  CBG's increased and patient started on IV insulin this AM for DKA.    Diabetes history: Type 1 diabetes for the past 28 years.   Outpatient Diabetes medications: Toujeo 11 units daily, Humalog: 70-159-4 units, 160-219-5 units, 220-279- 6 units, 280-339-7 units, 340-399-8 units, if BS>400 then 9 units. Current orders for Inpatient glycemic control:  IV insulin-DKA order set  Inpatient Diabetes Program Recommendations:    Spoke at length to son regarding patient.  He has been admitted 6 times according to son in the past 2 months. Son states that prior to these episodes, patient managed his diabetes independently.  He does have Type 1 diabetes.  Recently his father has been diagnosed with dementia which has impaired his ability to take his insulin appropriately.  Therefore he was in both a NH and assisted living facility.  However they also have been unable to manage blood sugars without DKA.  Patient has endocrinologist, however was sent to hospital from his office yesterday prior to being seen.  Based on son's reports and a review of past admissions, patient appears sensitive to insulin doses when given or not given.  Most recent BMP indicates AG=12 and CO2=18.  BMP pending as it was just drawn again. Note that patient received Lantus this morning at 2 am.  Therefore upon transition off insulin drip, consider only correction Novolog and Novolog meal coverage and then start Lantus at 10 PM.  Based on patient's weight and son's report, one unit of insulin seems to cover approximately 20 grams of CHO.  Therefore consider Novolog 3 units tid with meals to cover CHO  Intake (hold if patient eats less than 50%). Patient needs coverage of CHO intake to prevent spikes in blood sugars. Correction factor could be as high as 1 unit drops CBG approximately 60 mg/dL according to weight.   Please consider custom Novolog correction scale every 4 hours while in the hospital.  150-210 mg/dL- 1 unit, 601-093 mg/dL-2 units, 235-573 mg/dL-3 units, 220-254 mg/dL-4 units, 270-623 mg/dL-5 units and call MD.  Also please start Lantus 12 units this PM at 2200.  Consider including Dr. Sharl Ma in D/C medication planning so that facility or patient's son will know what to give patient for both hypoglycemia and hyperglycemia events.  At this time, we need to avoid both in this patient. Will follow.  Thanks, Beryl Meager, RN, MSN,  BC-ADM Inpatient Diabetes Coordinator Pager [phone] (8a-5p)

## 2015-10-30 ENCOUNTER — Ambulatory Visit: Payer: Medicare Other | Admitting: *Deleted

## 2015-10-30 DIAGNOSIS — I959 Hypotension, unspecified: Secondary | ICD-10-CM

## 2015-10-30 DIAGNOSIS — N179 Acute kidney failure, unspecified: Principal | ICD-10-CM

## 2015-10-30 DIAGNOSIS — I10 Essential (primary) hypertension: Secondary | ICD-10-CM

## 2015-10-30 DIAGNOSIS — J449 Chronic obstructive pulmonary disease, unspecified: Secondary | ICD-10-CM

## 2015-10-30 LAB — GLUCOSE, CAPILLARY
GLUCOSE-CAPILLARY: 101 mg/dL — AB (ref 65–99)
GLUCOSE-CAPILLARY: 107 mg/dL — AB (ref 65–99)
GLUCOSE-CAPILLARY: 108 mg/dL — AB (ref 65–99)
GLUCOSE-CAPILLARY: 113 mg/dL — AB (ref 65–99)
GLUCOSE-CAPILLARY: 113 mg/dL — AB (ref 65–99)
GLUCOSE-CAPILLARY: 122 mg/dL — AB (ref 65–99)
GLUCOSE-CAPILLARY: 137 mg/dL — AB (ref 65–99)
Glucose-Capillary: 120 mg/dL — ABNORMAL HIGH (ref 65–99)
Glucose-Capillary: 136 mg/dL — ABNORMAL HIGH (ref 65–99)
Glucose-Capillary: 141 mg/dL — ABNORMAL HIGH (ref 65–99)
Glucose-Capillary: 115 mg/dL — ABNORMAL HIGH (ref 65–99)
Glucose-Capillary: 125 mg/dL — ABNORMAL HIGH (ref 65–99)
Glucose-Capillary: 153 mg/dL — ABNORMAL HIGH (ref 65–99)
Glucose-Capillary: 127 mg/dL — ABNORMAL HIGH (ref 65–99)
Glucose-Capillary: 185 mg/dL — ABNORMAL HIGH (ref 65–99)

## 2015-10-30 LAB — BASIC METABOLIC PANEL
Anion gap: 12 (ref 5–15)
Anion gap: 11 (ref 5–15)
BUN: 33 mg/dL — ABNORMAL HIGH (ref 6–20)
BUN: 37 mg/dL — AB (ref 6–20)
CHLORIDE: 109 mmol/L (ref 101–111)
CO2: 18 mmol/L — ABNORMAL LOW (ref 22–32)
CO2: 18 mmol/L — AB (ref 22–32)
CREATININE: 1.55 mg/dL — AB (ref 0.61–1.24)
Calcium: 8.6 mg/dL — ABNORMAL LOW (ref 8.9–10.3)
Calcium: 8.7 mg/dL — ABNORMAL LOW (ref 8.9–10.3)
Chloride: 111 mmol/L (ref 101–111)
Creatinine, Ser: 1.42 mg/dL — ABNORMAL HIGH (ref 0.61–1.24)
GFR calc Af Amer: 50 mL/min — ABNORMAL LOW (ref >60)
GFR calc Af Amer: 55 mL/min — ABNORMAL LOW (ref >60)
GFR calc non Af Amer: 43 mL/min — ABNORMAL LOW (ref >60)
GFR calc non Af Amer: 48 mL/min — ABNORMAL LOW (ref >60)
GLUCOSE: 135 mg/dL — AB (ref 65–99)
Glucose, Bld: 125 mg/dL — ABNORMAL HIGH (ref 65–99)
Potassium: 3.7 mmol/L (ref 3.5–5.1)
Potassium: 4.1 mmol/L (ref 3.5–5.1)
SODIUM: 139 mmol/L (ref 135–145)
Sodium: 140 mmol/L (ref 135–145)

## 2015-10-30 LAB — TROPONIN I: Troponin I: 0.08 ng/mL — ABNORMAL HIGH (ref <0.031)

## 2015-10-30 MED ORDER — INSULIN ASPART 100 UNIT/ML ~~LOC~~ SOLN
0.0000 [IU] | Freq: Three times a day (TID) | SUBCUTANEOUS | Status: DC
  Administered 2015-10-31: 3 [IU] via SUBCUTANEOUS
  Administered 2015-10-31: 1 [IU] via SUBCUTANEOUS
  Administered 2015-10-31: 2 [IU] via SUBCUTANEOUS
  Administered 2015-11-01 (×2): 3 [IU] via SUBCUTANEOUS
  Administered 2015-11-02: 5 [IU] via SUBCUTANEOUS
  Administered 2015-11-02: 3 [IU] via SUBCUTANEOUS
  Administered 2015-11-03: 4 [IU] via SUBCUTANEOUS
  Administered 2015-11-03: 1 [IU] via SUBCUTANEOUS
  Administered 2015-11-03: 4 [IU] via SUBCUTANEOUS

## 2015-10-30 MED ORDER — INSULIN GLARGINE 100 UNIT/ML ~~LOC~~ SOLN
10.0000 [IU] | Freq: Every day | SUBCUTANEOUS | Status: AC
  Administered 2015-10-30: 10 [IU] via SUBCUTANEOUS
  Filled 2015-10-30: qty 0.1

## 2015-10-30 MED ORDER — INSULIN GLARGINE 100 UNIT/ML ~~LOC~~ SOLN
12.0000 [IU] | Freq: Every day | SUBCUTANEOUS | Status: DC
  Filled 2015-10-30: qty 0.12

## 2015-10-30 MED ORDER — INSULIN GLARGINE 100 UNIT/ML ~~LOC~~ SOLN
12.0000 [IU] | Freq: Every day | SUBCUTANEOUS | Status: DC
  Administered 2015-10-31 – 2015-11-01 (×2): 12 [IU] via SUBCUTANEOUS
  Filled 2015-10-30 (×2): qty 0.12

## 2015-10-30 MED ORDER — INSULIN ASPART 100 UNIT/ML ~~LOC~~ SOLN: 0.0000 [IU] | Freq: Every day | SUBCUTANEOUS | Status: DC

## 2015-10-30 MED ORDER — INSULIN ASPART 100 UNIT/ML ~~LOC~~ SOLN
0.0000 [IU] | Freq: Every day | SUBCUTANEOUS | Status: DC
  Administered 2015-11-02: 5 [IU] via SUBCUTANEOUS
  Administered 2015-11-03: 3 [IU] via SUBCUTANEOUS

## 2015-10-30 MED ORDER — INSULIN ASPART 100 UNIT/ML ~~LOC~~ SOLN
0.0000 [IU] | Freq: Three times a day (TID) | SUBCUTANEOUS | Status: DC
  Administered 2015-10-30: 2 [IU] via SUBCUTANEOUS
  Administered 2015-10-30: 1 [IU] via SUBCUTANEOUS

## 2015-10-30 MED ORDER — VANCOMYCIN HCL IN DEXTROSE 750-5 MG/150ML-% IV SOLN
750.0000 mg | INTRAVENOUS | Status: DC
  Administered 2015-10-30 – 2015-10-31 (×2): 750 mg via INTRAVENOUS
  Filled 2015-10-30 (×3): qty 150

## 2015-10-30 MED ORDER — SODIUM CHLORIDE 0.45 % IV SOLN
INTRAVENOUS | Status: DC
  Administered 2015-10-30 – 2015-10-31 (×2): via INTRAVENOUS

## 2015-10-30 MED ORDER — INSULIN ASPART 100 UNIT/ML ~~LOC~~ SOLN
3.0000 [IU] | Freq: Three times a day (TID) | SUBCUTANEOUS | Status: DC
  Administered 2015-10-30 – 2015-11-03 (×10): 3 [IU] via SUBCUTANEOUS

## 2015-10-30 MED ORDER — HYDROCORTISONE 2.5 % RE CREA
TOPICAL_CREAM | Freq: Four times a day (QID) | RECTAL | Status: DC
  Administered 2015-10-31 (×3): via RECTAL
  Filled 2015-10-30: qty 28.35

## 2015-10-30 NOTE — Progress Notes (Signed)
SENT TEXT PAGE TO DR. Jomarie Longs TO PLEASE UPDATE POA PER FAMILY REQUEST.

## 2015-10-30 NOTE — Progress Notes (Signed)
[facility] PROGRESS NOTE  JONTY MORRICAL AVW:[id number] DOB: Aug 23, 1941 DOA: 10/28/2015 PCP: Kimber Relic, MD  Assessment/Plan: 1. HCAP/Aspiration pneumonia -SLP eval completed last admission, was treated with levaquin then -now back on broad spectrum Abx, FU Cultures -repeat SLP eval, son has noticed difficulty with swallowing -Incentive spirometry  2. Chronic Diarrhea -immodium PRN  3. Mild DKA/uncontrolled DM -Stop insulin gtt, Lantus, SSI -Resume Diet -D/w DM coordinator, sees Dr.Kerr, difficulty managing Brittle DM at ALF -customized SSI  4. Seizures -continue Keppra  5. R Renal mass -needs Urology FU, seen by Dr.Grapey 3 weeks ago -interval increase in size  6. Metabolic encephalopathy -due to infection/DKA/Dementia -back to baseline now  7. Dementia -now stable  8. Abd pain/Diarrhea -has long standing diarrhea for >15years, takes immodium -Cdiff negative -per Pt workup negative, was told may have IBS -CT with mild thickening of terminal ileum/cecum  9. AKI on CKD3 -improving with hydration  10. Elevated Troponin -no chest pain, due to above infections/DKA -repeat troponin  DVT proph: lovenox  Code Status: Full Code Family Communication: none at bedside, called and d/w son [name] Disposition Plan: SNF when stable, [place] to tele later today     HPI/Subjective: No evenmts, diarrhea persists  Objective: Filed Vitals:   10/30/15 0412 10/30/15 0600  BP: 124/64 127/70  Pulse: 57 58  Temp:    Resp: 16 13    Intake/Output Summary (Last 24 hours) at 10/30/15 0739 Last data filed at 10/30/15 0700  Gross per 24 hour  Intake 3207.09 ml  Output   1000 ml  Net 2207.09 ml   Filed Weights   10/28/15 1419 10/29/15 0545 10/30/15 0300  Weight: 57.153 kg (126 lb) 57.8 kg (127 lb 6.8 oz) 60.6 kg (133 lb 9.6 oz)    Exam:   General:  AAOx3, no distress  Cardiovascular: S1S2/RRR  Respiratory: CTAB  Abdomen: soft, NT, BS  present  Musculoskeletal: no edema c/c  Neuro: non focal  Data Reviewed: Basic Metabolic Panel:  Recent Labs Lab 10/29/15 0721 10/29/15 1155 10/29/15 1456 10/29/15 2335 10/30/15 0406  NA 135  134* 138 140 139 140  K 3.4*  3.4* 3.4* 3.0* 3.7 4.1  CL 100*  100* 108 107 109 111  CO2 15*  15* 18* 20* 18* 18*  GLUCOSE 493*  488* 220* 137* 135* 125*  BUN 52*  52* 48* 46* 37* 33*  CREATININE 2.69*  2.74* 2.18* 1.94* 1.55* 1.42*  CALCIUM 8.6*  8.5* 8.4* 8.7* 8.6* 8.7*   Liver Function Tests:  Recent Labs Lab 10/28/15 1440 10/29/15 0721  AST 25 19  ALT 22 20  ALKPHOS 87 82  BILITOT 0.3 1.2  PROT 6.9 5.6*  ALBUMIN 3.5 3.0*    Recent Labs Lab 10/28/15 1440  LIPASE 33   No results for input(s): AMMONIA in the last 168 hours. CBC:  Recent Labs Lab 10/28/15 1440 10/29/15 0721  WBC 8.9 7.9  NEUTROABS 6.0  --   HGB 9.2* 9.3*  HCT 26.7* 29.1*  MCV 93.0 95.1  PLT 245 254   Cardiac Enzymes:  Recent Labs Lab 10/29/15 0721 10/29/15 1155 10/29/15 1944  TROPONINI <0.03 0.06* 0.12*   BNP (last 3 results)  Recent Labs  10/20/15 1043  BNP 280.3*    ProBNP (last 3 results) No results for input(s): PROBNP in the last 8760 hours.  CBG:  Recent Labs Lab 10/30/15 0148 10/30/15 0251 10/30/15 0408 10/30/15 0507 10/30/15 0557  GLUCAP 136* 141* 107* 113* 108*  Recent Results (from the past 240 hour(s))  Urine culture     Status: None   Collection Time: 10/28/15  6:07 PM  Result Value Ref Range Status   Specimen Description URINE, CATHETERIZED  Final   Special Requests NONE  Final   Culture NO GROWTH 1 DAY  Final   Report Status 10/29/2015 FINAL  Final  MRSA PCR Screening     Status: Abnormal   Collection Time: 10/29/15  5:32 AM  Result Value Ref Range Status   MRSA by PCR POSITIVE (A) NEGATIVE Final    Comment:        The GeneXpert MRSA Assay (FDA approved for NASAL specimens only), is one component of a comprehensive MRSA  colonization surveillance program. It is not intended to diagnose MRSA infection nor to guide or monitor treatment for MRSA infections. RESULT CALLED TO, READ BACK BY AND VERIFIED WITH: S. MOORE RN 10/29/15 AT 0815 BY A. DAVIS   C difficile quick scan w PCR reflex     Status: None   Collection Time: 10/29/15  7:30 AM  Result Value Ref Range Status   C Diff antigen NEGATIVE NEGATIVE Final   C Diff toxin NEGATIVE NEGATIVE Final   C Diff interpretation Negative for toxigenic C. difficile  Final  Gastrointestinal Panel by PCR , Stool     Status: None   Collection Time: 10/29/15  7:30 AM  Result Value Ref Range Status   Campylobacter species NOT DETECTED NOT DETECTED Final   Plesimonas shigelloides NOT DETECTED NOT DETECTED Final   Salmonella species NOT DETECTED NOT DETECTED Final   Yersinia enterocolitica NOT DETECTED NOT DETECTED Final   Vibrio species NOT DETECTED NOT DETECTED Final   Vibrio cholerae NOT DETECTED NOT DETECTED Final   Enteroaggregative E coli (EAEC) NOT DETECTED NOT DETECTED Final   Enteropathogenic E coli (EPEC) NOT DETECTED NOT DETECTED Final   Enterotoxigenic E coli (ETEC) NOT DETECTED NOT DETECTED Final   Shiga like toxin producing E coli (STEC) NOT DETECTED NOT DETECTED Final   E. coli O157 NOT DETECTED NOT DETECTED Final   Shigella/Enteroinvasive E coli (EIEC) NOT DETECTED NOT DETECTED Final   Cryptosporidium NOT DETECTED NOT DETECTED Final   Cyclospora cayetanensis NOT DETECTED NOT DETECTED Final   Entamoeba histolytica NOT DETECTED NOT DETECTED Final   Giardia lamblia NOT DETECTED NOT DETECTED Final   Adenovirus F40/41 NOT DETECTED NOT DETECTED Final   Astrovirus NOT DETECTED NOT DETECTED Final   Norovirus GI/GII NOT DETECTED NOT DETECTED Final   Rotavirus A NOT DETECTED NOT DETECTED Final   Sapovirus (I, II, IV, and V) NOT DETECTED NOT DETECTED Final     Studies: Ct Abdomen Pelvis Wo Contrast  10/28/2015  CLINICAL DATA:  Generalized weakness and  diarrhea for 4-5 weeks. EXAM: CT ABDOMEN AND PELVIS WITHOUT CONTRAST TECHNIQUE: Multidetector CT imaging of the abdomen and pelvis was performed following the standard protocol without IV contrast. COMPARISON:  Abdominal MRI from 10/02/2015 FINDINGS: Lower chest: Emphysema with underlying chronic interstitial coarsening and basilar atelectasis. Hepatobiliary: No focal abnormality in the liver on this study without intravenous contrast. No evidence of hepatomegaly. There is no evidence for gallstones, gallbladder wall thickening, or pericholecystic fluid. No intrahepatic or extrahepatic biliary dilation. Pancreas: No focal mass lesion. No dilatation of the main duct. No intraparenchymal cyst. No peripancreatic edema. Spleen: No splenomegaly. No focal mass lesion. Adrenals/Urinary Tract: No adrenal nodule or mass. Atherosclerotic calcification is seen and arterial anatomy of the right renal hilum. 3 mm nonobstructing stone  is seen in the lower pole of the right kidney. 11 mm water density lesion in the interpolar right kidney is compatible with a cyst as was seen on the previous MRI. 16 mm mildly enhancing right renal lesions seen on previous MRI is difficult to see on today's uninfused CT scan, but appears to measure about 2.2 cm Left kidney is unremarkable on this uninfused exam. No evidence for hydronephrosis. No evidence for hydroureter. Mild bladder wall thickening is evident. Stomach/Bowel: Stomach is nondistended, likely accounting for the apparent wall thickening. Duodenum diverticulum noted. No small bowel dilatation. Small bowel is not well distended which makes small bowel wall thickness difficult to assess, but despite this limitation, there does appear to be some true circumferential wall thickening in the terminal ileum. There may also be some abnormal wall thickening in the cecum, at the level of the ileocecal valve (see image 51 of series 2). The appendix is normal. Abnormal wall thickening is present  in the rectum (see images 65 -72 of series 2). Vascular/Lymphatic: There is abdominal aortic atherosclerosis without aneurysm. There is no gastrohepatic or hepatoduodenal ligament lymphadenopathy. No intraperitoneal or retroperitoneal lymphadenopathy. No pelvic sidewall lymphadenopathy. Reproductive: Prostate gland is unremarkable. Penile prosthesis is evident. Other: No intraperitoneal free fluid. Musculoskeletal: Bone windows reveal no worrisome lytic or sclerotic osseous lesions. IMPRESSION: 1. Apparent circumferential wall thickening in the terminal ileum and cecum at the level of the ileocecal valve. This is associated with more definite wall thickening in the rectum. Given the history of diarrhea, infectious/inflammatory enterocolitis would be a consideration. Neoplasm cannot be excluded. 2. Apparent interval increase in size of the right renal lesion evaluated by previous MRI and and found to be suspicious for possible papillary type renal cell carcinoma at that time. This is difficult to assess on today's noncontrast exam. Electronically Signed   By: Kennith Center M.D.   On: 10/28/2015 20:15   Dg Chest 1 View  10/28/2015  CLINICAL DATA:  Altered mental status.  Lethargy.  Cough. EXAM: CHEST 1 VIEW COMPARISON:  10/18/2015.  09/27/2015. FINDINGS: Heart size is normal with some left ventricular prominence. There is calcification of the aorta. There is chronic elevation of left hemidiaphragm. There is increased density at the left base when compared to previous studies consistent with left lower lobe pneumonia. No lobar collapse. No effusions. IMPRESSION: Chronically elevated left hemidiaphragm. Newly seen left base infiltrate consistent with bronchopneumonia. Electronically Signed   By: Paulina Fusi M.D.   On: 10/28/2015 15:50   Ct Head Wo Contrast  10/28/2015  CLINICAL DATA:  Generalized weakness and altered mental status. Pt has no complaints of pain or injury Hx: HTN, Diabetes, MI, Chronic fatigue  EXAM: CT HEAD WITHOUT CONTRAST TECHNIQUE: Contiguous axial images were obtained from the base of the skull through the vertex without intravenous contrast. COMPARISON:  10/18/2015 FINDINGS: Brain: Mild atrophy. No evidence of acute infarction, hemorrhage, extra-axial collection, ventriculomegaly, or mass effect. Vascular: No hyperdense vessel or unexpected calcification. Atherosclerotic and physiologic intracranial calcifications. Skull: Negative for fracture or focal lesion. Sinuses/Orbits: No acute findings. Other: None. IMPRESSION: 1. Negative for bleed or other acute intracranial process. Electronically Signed   By: Corlis Leak M.D.   On: 10/28/2015 16:16    Scheduled Meds: . budesonide-formoterol  2 puff Inhalation BID  . ceFEPime (MAXIPIME) IV  1 g Intravenous Q24H  . Chlorhexidine Gluconate Cloth  6 each Topical Q0600  . clopidogrel  75 mg Oral Q breakfast  . escitalopram  20 mg Oral Daily  .  heparin  5,000 Units Subcutaneous 3 times per day  . insulin aspart  0-5 Units Subcutaneous QHS  . insulin aspart  0-9 Units Subcutaneous TID WC  . insulin glargine  10 Units Subcutaneous Daily  . insulin glargine  12 Units Subcutaneous QHS  . levETIRAcetam  1,000 mg Oral BID  . levothyroxine  75 mcg Oral QAC breakfast  . mupirocin ointment  1 application Nasal BID  . pravastatin  20 mg Oral q1800  . pregabalin  100 mg Oral QHS  . pregabalin  50 mg Oral Daily  . saccharomyces boulardii  250 mg Oral QPM  . tiotropium  18 mcg Inhalation Daily  . vancomycin  500 mg Intravenous Q24H  . vitamin B-12  1,000 mcg Oral Daily   Continuous Infusions: . sodium chloride     Antibiotics Given (last 72 hours)    Date/Time Action Medication Dose Rate   10/29/15 0624 Given   ceFEPIme (MAXIPIME) 1 g in dextrose 5 % 50 mL IVPB 1 g 100 mL/hr   10/29/15 1716 Given   vancomycin (VANCOCIN) 500 mg in sodium chloride 0.9 % 100 mL IVPB 500 mg 100 mL/hr   10/30/15 0508 Given   ceFEPIme (MAXIPIME) 1 g in dextrose 5  % 50 mL IVPB 1 g 100 mL/hr      Principal Problem:   AKI (acute kidney injury) (HCC) Active Problems:   Type 1 diabetes mellitus with neurological manifestations, uncontrolled (HCC)   Essential hypertension   COPD (chronic obstructive pulmonary disease) (HCC)   CAD- moderate3V disease at cath 7/15- medical Rx   Seizures (HCC)   Dehydration   Diarrhea   Acute encephalopathy   Hypotension   Slurred speech    Time spent:    [facility]  [facility] Pager [phone]. If 7PM-7AM, please contact night-coverage at [email or web address], password [facility] 10/30/2015, 7:39 AM  LOS: 1 day

## 2015-10-30 NOTE — Progress Notes (Signed)
Inpatient Diabetes Program Recommendations  AACE/ADA: New Consensus Statement on Inpatient Glycemic Control (2015)  Target Ranges:  Prepandial:   less than 140 mg/dL      Peak postprandial:   less than 180 mg/dL (1-2 hours)      Critically ill patients:  140 - 180 mg/dL   Paged Dr. Jomarie Longs regarding patient and conversation I had with son on 10/29/15.  Patient transitioned off insulin drip this morning.  Received orders for custom Novolog correction scale and Novolog meal coverage.   Also will have RN check blood sugar at 3 am to assess glucose control in the middle of the night.    Thanks, Beryl Meager, RN, BC-ADM Inpatient Diabetes Coordinator Pager [phone] (8a-5p)

## 2015-10-30 NOTE — Evaluation (Signed)
History of Present Illness  75 y.o. male admitted on 10/28/2015 with pneumonia  Clinical Impression  Patient demonstrates deficits in functional mobility as indicated below. Will need continued skilled PT to address deficits and maximize function. Will see as indicated and progress as tolerated. At this time, highly recommend ST SNF upon acute discharge. VSS, BP elevated 160s/80s, nsg aware.    Follow Up Recommendations SNF;Supervision/Assistance - 24 hour    Equipment Recommendations  Rolling walker with 5" wheels    Recommendations for Other Services       Precautions / Restrictions Precautions Precautions: Fall Restrictions Weight Bearing Restrictions: No      Mobility  Bed Mobility Overal bed mobility: Needs Assistance Bed Mobility: Supine to Sit     Supine to sit: Min assist     General bed mobility comments: maximial cues to carry out task, patient with poor ability to carry out instruction  Transfers Overall transfer level: Needs assistance Equipment used: 1 person hand held assist (Bilateral UE support using back of chair) Transfers: Sit to/from [employer] Sit to Stand: Min guard;Min assist Stand pivot transfers: Min assist       General transfer comment: min assist to pivot from chair to bed. Assist for stability and cues for hand placement. Min guard for standing from bedside commode with UE support on back of chair (performed x6)  Ambulation/Gait             General Gait Details: declined due to fatigue from pericare/hygiene activities  Stairs            Wheelchair Mobility    Modified Rankin (Stroke Patients Only)       Balance   Sitting-balance support: Feet supported Sitting balance-Leahy Scale: Good       Standing balance-Leahy Scale: Fair Standing balance comment: use of UE support to stabilize during hygiene and pericare                             Pertinent Vitals/Pain Pain Assessment:  Faces Faces Pain Scale: Hurts little more Pain Location: buttocks Pain Descriptors / Indicators: Burning Pain Intervention(s): Limited activity within patient's tolerance;Monitored during session;Repositioned;Other (comment) (hygiene and pericare performed, barrier cream applied)    Home Living Family/patient expects to be discharged to:: Skilled nursing facility     Type of Home: Assisted living         Home Equipment: Gilmer Mor - single point Additional Comments: granddaughter lives upstairs but not abvailable 24/7    Prior Function Level of Independence: Independent with assistive device(s)         Comments: per patient, question accuracy of historian     Hand Dominance        Extremity/Trunk Assessment               Lower Extremity Assessment: Generalized weakness (poor coordination during limited tasks)         Communication      Cognition Arousal/Alertness: Awake/alert Behavior During Therapy: WFL for tasks assessed/performed;Impulsive Overall Cognitive Status: No family/caregiver present to determine baseline cognitive functioning Area of Impairment: Attention;Following commands;Safety/judgement;Awareness;Problem solving     Memory: Decreased short-term memory Following Commands: Follows one step commands with increased time Safety/Judgement: Decreased awareness of safety Awareness: Intellectual Problem Solving: Slow processing;Decreased initiation;Difficulty sequencing;Requires verbal cues;Requires tactile cues General Comments: Patient required maximal cues to come to EOB with increased instruction given. Patient with poor self awareness regarding incontinence and importance of  hygiene. Patient also had difficulty following commands for use of telephone to call to order his lunch despite increased assist and verbal cues for task completion.     General Comments      Exercises        Assessment/Plan    PT Assessment Patient needs continued PT  services  PT Diagnosis Difficulty walking;Generalized weakness   PT Problem List Decreased strength;Decreased activity tolerance;Decreased balance;Decreased mobility;Decreased cognition;Decreased knowledge of use of DME;Decreased safety awareness  PT Treatment Interventions Balance training;Gait training;Patient/family education;Functional mobility training;Therapeutic activities;Therapeutic exercise;DME instruction   PT Goals (Current goals can be found in the Care Plan section) Acute Rehab PT Goals Patient Stated Goal: none stated PT Goal Formulation: With patient Time For Goal Achievement: 11/13/15 Potential to Achieve Goals: Good    Frequency Min 2X/week   Barriers to discharge        Co-evaluation               End of Session Equipment Utilized During Treatment: Gait belt Activity Tolerance: Patient tolerated treatment well Patient left: in chair;with call bell/phone within reach;with chair alarm set Nurse Communication: Mobility status         Time: 1610-9604 PT Time Calculation (min) (ACUTE ONLY): 21 min   Charges:   PT Evaluation $PT Eval Moderate Complexity: 1 Procedure     PT G CodesFabio Hodge Nov 07, 2015, 11:39 AM Benjamin Hodge, PT DPT  [phone]

## 2015-10-30 NOTE — Clinical Social Work Note (Signed)
CSW spoke with patient's son to update on bed search process.  CSW informed patient that there are several facilities that take patient's insurance in the [place] and [address] area.  Patient's son is in agreement to going to SNF for short term rehab.  CSW to present bed offers once they have been received.  CSW to continue to follow patient's progress.  Ervin Knack. Alija Riano, MSW, Theresia Majors [phone] 10/30/2015 5:40 PM

## 2015-10-30 NOTE — [place] FL2 (Signed)
[place] MEDICAID FL2 LEVEL OF CARE SCREENING TOOL     IDENTIFICATION  Patient Name: Benjamin Hodge Birthdate: 10/06/1940 Sex: male Admission Date (Current Location): 10/28/2015  [facility] and [place] Number:  Producer, television/film/video and Address:  [facility] [place], [address], [place], [place] [address]      Provider Number: [id number]  Attending Physician Name and Address:  Zannie Cove, MD  Relative Name and Phone Number:  Olsen Mccutchan [phone]    Current Level of Care: Hospital Recommended Level of Care: Skilled Nursing Facility Prior Approval Number:    Date Approved/Denied:   PASRR Number: [id number]  Discharge Plan: SNF    Current Diagnoses: Patient Active Problem List   Diagnosis Date Noted  . Hypotension 10/29/2015  . Slurred speech   . Hyperglycemia 10/18/2015  . Hyperglycemic crisis in diabetes mellitus (HCC) 10/18/2015  . Aspiration pneumonia (HCC) 10/18/2015  . Acute encephalopathy   . Acute hyperglycemia   . Diarrhea 10/16/2015  . Renal mass, right: Incentendal finding MRI 10/02/2015 10/03/2015  . Hypogonadism in male 10/03/2015  . Dehydration   . Hyperglycemia due to type 1 diabetes mellitus (HCC) 10/02/2015  . Encephalopathy, metabolic   . Heme positive stool 10/01/2015  . Elevated CEA 09/30/2015  . Elevated CA 19-9 level 09/30/2015  . CKD (chronic kidney disease), stage II 09/30/2015  . COPD with exacerbation (HCC) 09/27/2015  . Narcotic dependency, continuous (HCC) 09/27/2015  . AKI (acute kidney injury) (HCC) 09/27/2015  . DKA (diabetic ketoacidosis) (HCC) 09/14/2015  . Narcotic abuse   . Hyperglycemia without ketosis 09/13/2015  . DKA (diabetic ketoacidoses) (HCC) 09/06/2015  . Partial symptomatic epilepsy with complex partial seizures, not intractable, without status epilepticus (HCC) 07/16/2015  . Dementia arising in the senium and presenium 07/16/2015  . Hypersomnia with long sleep time, idiopathic  07/16/2015  . Pruritus of scalp 05/04/2015  . Loose stools 05/04/2015  . Absence attack (HCC) 05/04/2015  . B12 deficiency 05/04/2015  . Seizures (HCC) 04/10/2015  . Cerebrovascular disease 04/10/2015  . PVD (peripheral vascular disease) (HCC) 02/18/2015  . Testosterone deficiency 01/02/2015  . 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 Right-sided chest wall pain 11/27/2014  . Ecchymosis 08/29/2014  . Anemia- chronic 04/03/2014  . CAD- moderate3V disease at cath 7/15- medical Rx 03/28/2014  . Dyslipidemia 03/13/2014  . Non-STEMI (non-ST elevated myocardial infarction) (HCC) 03/10/2014  . 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 Low back pain potentially associated with spinal stenosis 02/26/2014  . ADHD, adult residual type 05/31/2013  . Elevated AST (SGOT) 01/23/2013  . SHOULDER PAIN, LEFT 03/04/2009  . ANXIETY 07/18/2008  . IRRITABLE BOWEL SYNDROME 04/27/2008  . NOCTURIA 03/13/2008  . COPD (chronic obstructive pulmonary disease) (HCC) 02/27/2008  . Mild nonproliferative diabetic retinopathy (HCC) 11/18/2007  . LEG PAIN, RIGHT 09/29/2007  . POLYNEUROPATHY IN DIABETES 02/09/2007  . HYPOTENSION, ORTHOSTATIC 02/09/2007  . Hypothyroidism 09/27/2006  . Type 1 diabetes mellitus with neurological manifestations, uncontrolled (HCC) 09/27/2006  . ERECTILE DYSFUNCTION 09/27/2006  . Essential hypertension 09/27/2006  . SPINAL STENOSIS, CERVICAL 09/27/2006  . Hypersomnia 09/27/2006    Orientation RESPIRATION BLADDER Height & Weight     Self, Place, Situation  Normal Incontinent Weight: 133 lb 9.6 oz (60.6 kg) Height:  5\' 6"  (167.6 cm)  BEHAVIORAL SYMPTOMS/MOOD NEUROLOGICAL BOWEL NUTRITION STATUS      Continent Diet  AMBULATORY STATUS COMMUNICATION OF NEEDS Skin   Limited Assist Verbally Normal  Personal Care Assistance Level of Assistance              Functional Limitations  Info  Sight, Hearing, Speech Sight Info: Adequate Hearing Info: Adequate Speech Info: Adequate    SPECIAL CARE FACTORS FREQUENCY  PT (By licensed PT)     PT Frequency: 5x a week              Contractures      Additional Factors Info  Code Status, Allergies, Insulin Sliding Scale   Allergies Info: ACE INHIBITORS, CYMBALTA, LIPITOR, BEE VENOM, GLUCERNA, ANGIOTENSIN RECEPTOR BLOCKERS   Insulin Sliding Scale Info: 3x a day       Current Medications (10/30/2015):  This is the current hospital active medication list Current Facility-Administered Medications  Medication Dose Route Frequency Provider Last Rate Last Dose  . 0.45 % sodium chloride infusion   Intravenous Continuous Zannie Cove, MD 75 mL/hr at 10/30/15 1700    . acetaminophen (TYLENOL) tablet 650 mg  650 mg Oral Q6H PRN Eduard Clos, MD   650 mg at 10/30/15 1457   Or  . acetaminophen (TYLENOL) suppository 650 mg  650 mg Rectal Q6H PRN Eduard Clos, MD      . budesonide-formoterol (SYMBICORT) 160-4.5 MCG/ACT inhaler 2 puff  2 puff Inhalation BID Eduard Clos, MD   2 puff at 10/30/15 [phone]  . ceFEPIme (MAXIPIME) 1 g in dextrose 5 % 50 mL IVPB  1 g Intravenous Q24H Marquita Palms, RPH   1 g at 10/30/15 1478  . Chlorhexidine Gluconate Cloth 2 % PADS 6 each  6 each Topical Q0600 Clydia Llano, MD   6 each at 10/30/15 0509  . clopidogrel (PLAVIX) tablet 75 mg  75 mg Oral Q breakfast Eduard Clos, MD   75 mg at 10/30/15 0847  . escitalopram (LEXAPRO) tablet 20 mg  20 mg Oral Daily Eduard Clos, MD   20 mg at 10/30/15 0846  . heparin injection 5,000 Units  5,000 Units Subcutaneous 3 times per day Eduard Clos, MD   5,000 Units at 10/30/15 1304  . insulin aspart (novoLOG) injection 0-5 Units  0-5 Units Subcutaneous TID WC Zannie Cove, MD   0 Units at 10/30/15 1659  . insulin aspart (novoLOG) injection 0-5 Units  0-5 Units Subcutaneous QHS Zannie Cove, MD      . insulin aspart  (novoLOG) injection 3 Units  3 Units Subcutaneous TID WC Zannie Cove, MD   3 Units at 10/30/15 1659  . [START ON 10/31/2015] insulin glargine (LANTUS) injection 12 Units  12 Units Subcutaneous Daily Zannie Cove, MD      . levETIRAcetam (KEPPRA) tablet 1,000 mg  1,000 mg Oral BID Eduard Clos, MD   1,000 mg at 10/30/15 0846  . levothyroxine (SYNTHROID, LEVOTHROID) tablet 75 mcg  75 mcg Oral QAC breakfast Eduard Clos, MD   75 mcg at 10/30/15 0847  . mupirocin ointment (BACTROBAN) 2 % 1 application  1 application Nasal BID Clydia Llano, MD   1 application at 10/30/15 0851  . ondansetron (ZOFRAN) tablet 4 mg  4 mg Oral Q6H PRN Eduard Clos, MD       Or  . ondansetron [facility]) injection 4 mg  4 mg Intravenous Q6H PRN Eduard Clos, MD      . pravastatin (PRAVACHOL) tablet 20 mg  20 mg Oral q1800 Eduard Clos, MD   20 mg at 10/30/15 1701  . pregabalin (LYRICA) capsule 100 mg  100 mg Oral QHS Eduard Clos, MD   100 mg at 10/29/15 2207  . pregabalin (LYRICA) capsule 50 mg  50 mg Oral Daily Eduard Clos, MD   50 mg at 10/30/15 0847  . saccharomyces boulardii (FLORASTOR) capsule 250 mg  250 mg Oral QPM Eduard Clos, MD   250 mg at 10/30/15 1701  . tiotropium (SPIRIVA) inhalation capsule 18 mcg  18 mcg Inhalation Daily Eduard Clos, MD   18 mcg at 10/30/15 [phone]  . vancomycin (VANCOCIN) IVPB 750 mg/150 ml premix  750 mg Intravenous Q24H Earnie Larsson, RPH   750 mg at 10/30/15 1709  . vitamin B-12 (CYANOCOBALAMIN) tablet 1,000 mcg  1,000 mcg Oral Daily Eduard Clos, MD   1,000 mcg at 10/30/15 6720     Discharge Medications: Please see discharge summary for a list of discharge medications.  Relevant Imaging Results:  Relevant Lab Results:   Additional Information SSN [id number]  Darleene Cleaver, [place]

## 2015-10-31 ENCOUNTER — Inpatient Hospital Stay (HOSPITAL_COMMUNITY): Payer: Medicare Other

## 2015-10-31 DIAGNOSIS — R4781 Slurred speech: Secondary | ICD-10-CM

## 2015-10-31 DIAGNOSIS — E1065 Type 1 diabetes mellitus with hyperglycemia: Secondary | ICD-10-CM

## 2015-10-31 DIAGNOSIS — E1049 Type 1 diabetes mellitus with other diabetic neurological complication: Secondary | ICD-10-CM

## 2015-10-31 LAB — GLUCOSE, CAPILLARY
GLUCOSE-CAPILLARY: 212 mg/dL — AB (ref 65–99)
GLUCOSE-CAPILLARY: 151 mg/dL — AB (ref 65–99)
GLUCOSE-CAPILLARY: 70 mg/dL (ref 65–99)
Glucose-Capillary: 235 mg/dL — ABNORMAL HIGH (ref 65–99)
Glucose-Capillary: 267 mg/dL — ABNORMAL HIGH (ref 65–99)
Glucose-Capillary: 160 mg/dL — ABNORMAL HIGH (ref 65–99)
Glucose-Capillary: 40 mg/dL — CL (ref 65–99)

## 2015-10-31 LAB — CBC
HEMATOCRIT: 29 % — AB (ref 39.0–52.0)
HEMOGLOBIN: 9.5 g/dL — AB (ref 13.0–17.0)
MCH: 30.2 pg (ref 26.0–34.0)
MCHC: 32.8 g/dL (ref 30.0–36.0)
MCV: 92.1 fL (ref 78.0–100.0)
Platelets: 194 10*3/uL (ref 150–400)
RBC: 3.15 MIL/uL — ABNORMAL LOW (ref 4.22–5.81)
RDW: 13.3 % (ref 11.5–15.5)
WBC: 4.4 10*3/uL (ref 4.0–10.5)

## 2015-10-31 LAB — BASIC METABOLIC PANEL
Anion gap: 12 (ref 5–15)
BUN: 19 mg/dL (ref 6–20)
CHLORIDE: 108 mmol/L (ref 101–111)
CO2: 22 mmol/L (ref 22–32)
CREATININE: 1.06 mg/dL (ref 0.61–1.24)
Calcium: 8.9 mg/dL (ref 8.9–10.3)
GFR calc non Af Amer: >60 mL/min (ref >60)
Glucose, Bld: 244 mg/dL — ABNORMAL HIGH (ref 65–99)
POTASSIUM: 3.8 mmol/L (ref 3.5–5.1)
Sodium: 142 mmol/L (ref 135–145)

## 2015-10-31 LAB — PROCALCITONIN: Procalcitonin: 0.35 ng/mL

## 2015-10-31 MED ORDER — HYDRALAZINE HCL 20 MG/ML IJ SOLN
10.0000 mg | Freq: Once | INTRAMUSCULAR | Status: AC
  Administered 2015-10-31: 10 mg via INTRAVENOUS
  Filled 2015-10-31: qty 1

## 2015-10-31 MED ORDER — LOPERAMIDE HCL 2 MG PO CAPS
2.0000 mg | ORAL_CAPSULE | Freq: Two times a day (BID) | ORAL | Status: DC
  Administered 2015-10-31 – 2015-11-04 (×9): 2 mg via ORAL
  Filled 2015-10-31 (×9): qty 1

## 2015-10-31 MED ORDER — DEXTROSE 5 % IV SOLN
1.0000 g | Freq: Two times a day (BID) | INTRAVENOUS | Status: DC
  Administered 2015-11-01 (×2): 1 g via INTRAVENOUS
  Filled 2015-10-31 (×4): qty 1

## 2015-10-31 MED ORDER — HYDROCORTISONE 2.5 % RE CREA
TOPICAL_CREAM | Freq: Four times a day (QID) | RECTAL | Status: DC | PRN
  Filled 2015-10-31: qty 28.35

## 2015-10-31 NOTE — Progress Notes (Signed)
Report called to 5W 

## 2015-10-31 NOTE — Progress Notes (Signed)
[facility] PROGRESS NOTE  ERNESTO ZUKOWSKI ZOX:[id number] DOB: 19-Jun-1941 DOA: 10/28/2015 PCP: Kimber Relic, MD  Assessment/Plan: 1. HCAP/Aspiration pneumonia -SLP eval completed last admission, was treated with levaquin then -back on broad spectrum Abx since admission, Day2,  -Blood Cx-NGTD x1 day -repeat SLP eval, son has noticed difficulty with swallowing -Incentive spirometry -change to PO Abx in am  2. Chronic Diarrhea -immodium PRN  3. Mild DKA/uncontrolled DM -off insulin gtt,  -continue Lantus, meal coverage and customized SSI -Carb mod Diet -D/w DM coordinator, sees Dr.Kerr, difficulty managing Brittle DM at ALF  4. Seizures -continue Keppra  5. R Renal mass -needs Urology FU, seen by Dr.Grapey 3 weeks ago -interval increase in size  6. Metabolic encephalopathy -due to infection/DKA/Dementia -back to baseline now  7. Dementia -now stable  8. Abd pain/Diarrhea -has long standing diarrhea for >15years, takes immodium -Cdiff negative -per Pt workup negative, was told may have IBS -CT with mild thickening of terminal ileum/cecum, likely gastroenteritis-GI pathogen panel negative, Cdiff negative -resolved, tolerating regular diet  9. AKI on CKD3 -resolved with hydration  10. Elevated Troponin -no chest pain, due to above infections/DKA -repeat troponin improved, 0.08, non ACS pattern  DVT proph: lovenox  Code Status: Full Code Family Communication: none at bedside, called and d/w son [name] Disposition Plan: SNF in 24-48h   HPI/Subjective: No evenmts, diarrhea persists, breathing ok  Objective: Filed Vitals:   10/31/15 1106 10/31/15 1200  BP: 104/73 125/60  Pulse: 78 82  Temp: 98.9 F (37.2 C)   Resp: 12 17    Intake/Output Summary (Last 24 hours) at 10/31/15 1335 Last data filed at 10/31/15 1217  Gross per 24 hour  Intake   2305 ml  Output   4850 ml  Net  -2545 ml   Filed Weights   10/29/15 0545 10/30/15 0300 10/31/15 0331   Weight: 57.8 kg (127 lb 6.8 oz) 60.6 kg (133 lb 9.6 oz) 58.2 kg (128 lb 4.9 oz)    Exam:   General:  AAOx3, no distress  Cardiovascular: S1S2/RRR  Respiratory: CTAB  Abdomen: soft, NT, BS present  Musculoskeletal: no edema c/c  Neuro: non focal  Data Reviewed: Basic Metabolic Panel:  Recent Labs Lab 10/29/15 1155 10/29/15 1456 10/29/15 2335 10/30/15 0406 10/31/15 0445  NA 138 140 139 140 142  K 3.4* 3.0* 3.7 4.1 3.8  CL 108 107 109 111 108  CO2 18* 20* 18* 18* 22  GLUCOSE 220* 137* 135* 125* 244*  BUN 48* 46* 37* 33* 19  CREATININE 2.18* 1.94* 1.55* 1.42* 1.06  CALCIUM 8.4* 8.7* 8.6* 8.7* 8.9   Liver Function Tests:  Recent Labs Lab 10/28/15 1440 10/29/15 0721  AST 25 19  ALT 22 20  ALKPHOS 87 82  BILITOT 0.3 1.2  PROT 6.9 5.6*  ALBUMIN 3.5 3.0*    Recent Labs Lab 10/28/15 1440  LIPASE 33   No results for input(s): AMMONIA in the last 168 hours. CBC:  Recent Labs Lab 10/28/15 1440 10/29/15 0721 10/31/15 0445  WBC 8.9 7.9 4.4  NEUTROABS 6.0  --   --   HGB 9.2* 9.3* 9.5*  HCT 26.7* 29.1* 29.0*  MCV 93.0 95.1 92.1  PLT 245 254 194   Cardiac Enzymes:  Recent Labs Lab 10/29/15 0721 10/29/15 1155 10/29/15 1944 10/30/15 1655  TROPONINI <0.03 0.06* 0.12* 0.08*   BNP (last 3 results)  Recent Labs  10/20/15 1043  BNP 280.3*    ProBNP (last 3 results) No results for input(s):  PROBNP in the last 8760 hours.  CBG:  Recent Labs Lab 10/30/15 1653 10/30/15 2147 10/31/15 0225 10/31/15 0744 10/31/15 1154  GLUCAP 127* 185* 235* 267* 212*    Recent Results (from the past 240 hour(s))  Urine culture     Status: None   Collection Time: 10/28/15  6:07 PM  Result Value Ref Range Status   Specimen Description URINE, CATHETERIZED  Final   Special Requests NONE  Final   Culture NO GROWTH 1 DAY  Final   Report Status 10/29/2015 FINAL  Final  Culture, blood (routine x 2)     Status: None (Preliminary result)   Collection Time:  10/29/15 12:55 AM  Result Value Ref Range Status   Specimen Description BLOOD RIGHT HAND  Final   Special Requests BOTTLES DRAWN AEROBIC ONLY 5CC  Final   Culture NO GROWTH 1 DAY  Final   Report Status PENDING  Incomplete  Culture, blood (routine x 2)     Status: None (Preliminary result)   Collection Time: 10/29/15  1:03 AM  Result Value Ref Range Status   Specimen Description BLOOD RIGHT ARM  Final   Special Requests IN PEDIATRIC BOTTLE 2CC  Final   Culture NO GROWTH 1 DAY  Final   Report Status PENDING  Incomplete  MRSA PCR Screening     Status: Abnormal   Collection Time: 10/29/15  5:32 AM  Result Value Ref Range Status   MRSA by PCR POSITIVE (A) NEGATIVE Final    Comment:        The GeneXpert MRSA Assay (FDA approved for NASAL specimens only), is one component of a comprehensive MRSA colonization surveillance program. It is not intended to diagnose MRSA infection nor to guide or monitor treatment for MRSA infections. RESULT CALLED TO, READ BACK BY AND VERIFIED WITH: S. MOORE RN 10/29/15 AT 0815 BY A. DAVIS   C difficile quick scan w PCR reflex     Status: None   Collection Time: 10/29/15  7:30 AM  Result Value Ref Range Status   C Diff antigen NEGATIVE NEGATIVE Final   C Diff toxin NEGATIVE NEGATIVE Final   C Diff interpretation Negative for toxigenic C. difficile  Final  Gastrointestinal Panel by PCR , Stool     Status: None   Collection Time: 10/29/15  7:30 AM  Result Value Ref Range Status   Campylobacter species NOT DETECTED NOT DETECTED Final   Plesimonas shigelloides NOT DETECTED NOT DETECTED Final   Salmonella species NOT DETECTED NOT DETECTED Final   Yersinia enterocolitica NOT DETECTED NOT DETECTED Final   Vibrio species NOT DETECTED NOT DETECTED Final   Vibrio cholerae NOT DETECTED NOT DETECTED Final   Enteroaggregative E coli (EAEC) NOT DETECTED NOT DETECTED Final   Enteropathogenic E coli (EPEC) NOT DETECTED NOT DETECTED Final   Enterotoxigenic E coli  (ETEC) NOT DETECTED NOT DETECTED Final   Shiga like toxin producing E coli (STEC) NOT DETECTED NOT DETECTED Final   E. coli O157 NOT DETECTED NOT DETECTED Final   Shigella/Enteroinvasive E coli (EIEC) NOT DETECTED NOT DETECTED Final   Cryptosporidium NOT DETECTED NOT DETECTED Final   Cyclospora cayetanensis NOT DETECTED NOT DETECTED Final   Entamoeba histolytica NOT DETECTED NOT DETECTED Final   Giardia lamblia NOT DETECTED NOT DETECTED Final   Adenovirus F40/41 NOT DETECTED NOT DETECTED Final   Astrovirus NOT DETECTED NOT DETECTED Final   Norovirus GI/GII NOT DETECTED NOT DETECTED Final   Rotavirus A NOT DETECTED NOT DETECTED Final   Sapovirus (  I, II, IV, and V) NOT DETECTED NOT DETECTED Final     Studies: Dg Swallowing Func-speech Pathology  10/31/2015  Objective Swallowing Evaluation: Type of Study: MBS-Modified Barium Swallow Study Patient Details Name: AMARANTE BUKOSKI MRN: [id number] Date of Birth: 1941/06/15 Today's Date: 10/31/2015 Time: SLP Start Time (ACUTE ONLY): 1032-SLP Stop Time (ACUTE ONLY): 1047 SLP Time Calculation (min) (ACUTE ONLY): 15 min Past Medical History: Past Medical History Diagnosis Date . CAD (coronary artery disease)    LAD 40-50% stenosis, first diagonal small 90% stenosis, circumflex 70% stenosis, OM 80% stenosis, right coronary artery 80-90% stenosis. . Right upper lobe pneumonia 11/28/2008 . Anxiety 07/18/2008 . Irritable bowel syndrome 04/27/2008 . COPD (chronic obstructive pulmonary disease) (HCC) 02/27/2008 . B12 deficiency 02/16/2008   in 5/09: B12 262, MMA 1040 . Chronic diarrhea 01/01/2008   s/p EGD 10/06: H pylori + gastritis, duodenal biopsy normal (Dr. Elnoria Howard); s/p colonoscopy 10/06: hemorrhoids (Dr. Elnoria Howard); evaluated by Dr. Yancey Flemings  in 8/09: tissue transglutaminase AB negative, VIP normal, stool fat content normal, urine 5-HIAA normal; normal GES 11/09 (done for "fluctuating sugars") . 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  Orthostatic hypotension 02/09/2007 . Hypertension 02/09/2007 . Spinal stenosis in cervical region 05/03   MRI . Erectile dysfunction 09/27/2006   s/p penile implant . Anemia, iron deficiency 09/27/2006   neg colonoscopy 2006 - Dr. Elnoria Howard; ferritin 152; hgb  15.7  on 07/07 . Hypothyroidism 09/27/2006   TSH 2.639  07/07 . Hypersomnia 09/27/2006   evaluated by Dr. Jetty Duhamel (5/08) and Dr. Vickey Huger; PSG 8/09: chronic delayed sleep phase syndrome (patient sleeps during the day and is awake at night), nocturnal myoclonus (eval for RLS and IDA suggested). Pt advised to change sleeping behavior . Diabetes mellitus type II 09/07/1984   poorly controlled, complicated by peripheral neuropathy, microalbuminuria, mild non proliferative retinopathy, s/p DKA 7/05, on insulin pump x 4/09, started a pump vacation on 11/19/2008 . Hyperlipidemia  . Hypogonadism male 08/2011 . Hemorrhoids  . Chronic kidney disease  . Unspecified hereditary and idiopathic peripheral neuropathy  . Other chronic pain  . Other malaise and fatigue  . Depression  . Hypertrophy of prostate without urinary obstruction and other lower urinary tract symptoms (LUTS)  . Lumbago  . Heart attack (HCC) 03/2014   mild . Renal mass, right: Incentendal finding MRI 10/02/2015 10/03/2015 . Hypogonadism in male 10/03/2015 . Asthma  Past Surgical History: Past Surgical History Procedure Laterality Date . Penile prosthesis implant   . Tonsillectomy   . Colonoscopy  06/25/2005   Dr. Jeani Hawking . Left heart catheterization with coronary angiogram [name] 03/13/2014   Procedure: LEFT HEART CATHETERIZATION WITH CORONARY ANGIOGRAM;  Surgeon: Peter M Swaziland, MD;  Location: [facility] CATH LAB;  Service: Cardiovascular;  Laterality: [name]; . Peripheral vascular catheterization [name] 02/19/2015   Procedure: Abdominal Aortogram;  Surgeon: Nada Libman, MD;  Location: [facility] INVASIVE CV LAB;  Service: Cardiovascular;  Laterality: [name]; . Peripheral vascular catheterization Left 02/19/2015   Procedure: Lower  Extremity Angiography;  Surgeon: Nada Libman, MD;  Location: [facility] INVASIVE CV LAB;  Service: Cardiovascular;  Laterality: Left; HPI: 75 y.o. male with h/o CAD, recurrent pneumonia, COPD, HTN, DM type 2, HLD, depression, asthma and "mild" heart attack (2015) who presented to ED 2/20 after syncopal episode at endocrinologist's office. Pt admitted last week for pneumonia and uncontrolled DM. CT Head 2/20 negative for bleed or other acute intracranial process. CXR 2/20 newly seen L base infiltrate consistent with bronchopneumonia. BSE 10/21/2015 recommended regular diet  and thin liquids. No Data Recorded Assessment / Plan / Recommendation CHL IP CLINICAL IMPRESSIONS 10/31/2015 Therapy Diagnosis Mild pharyngeal phase dysphagia Clinical Impression Pt exhibited mild pharyngeal phase dysphagia characterized by sensorimotor deficits. During intake of thin liquids via straw, silent penetration to the vocal folds observed due to decreased laryngeal elevation and epiglottic deflection. SLP provided min verbal cues to clear. Further penetration episodes prevented with removal of straw. Min residue noted in the vallecula and pyriform sinuses and with min verbal cues pt able to initiate volitional swallow to clear. Mastication WFL. MBS does not diagnose MBS below UES, however scan of esophagus noted esophageal stasis appearing in distal toward mid esophagus. Pt educated re: diet recommendation of regular diet and thin liquids (no straws), meds whole in puree and esophageal precautions (eat upright, stay upright at least 45 minutes after PO intake, alternate solids and liquids) as well as swallow 2x intermittently throughout meal to clear residue. SLP will f/u to determine diet toleration. Suggest assessment of esophagus to evaluate stasis. Impact on safety and function Mild aspiration risk   CHL IP TREATMENT RECOMMENDATION 10/31/2015 Treatment Recommendations Therapy as outlined in treatment plan below   Prognosis 10/31/2015  Prognosis for Safe Diet Advancement Good Barriers to Reach Goals -- Barriers/Prognosis Comment -- CHL IP DIET RECOMMENDATION 10/31/2015 SLP Diet Recommendations Regular solids;Thin liquid Liquid Administration via No straw;Cup Medication Administration Whole meds with puree Compensations Minimize environmental distractions;Slow rate;Small sips/bites;Follow solids with liquid Postural Changes Seated upright at 90 degrees;Remain semi-upright after after feeds/meals (Comment)   CHL IP OTHER RECOMMENDATIONS 10/31/2015 Recommended Consults Consider esophageal assessment Oral Care Recommendations Oral care BID Other Recommendations --   CHL IP FOLLOW UP RECOMMENDATIONS 10/31/2015 Follow up Recommendations (No Data)   CHL IP FREQUENCY AND DURATION 10/31/2015 Speech Therapy Frequency (ACUTE ONLY) min 2x/week Treatment Duration 2 weeks      CHL IP ORAL PHASE 10/31/2015 Oral Phase WFL Oral - Pudding Teaspoon -- Oral - Pudding Cup -- Oral - Honey Teaspoon -- Oral - Honey Cup -- Oral - Nectar Teaspoon -- Oral - Nectar Cup -- Oral - Nectar Straw -- Oral - Thin Teaspoon -- Oral - Thin Cup -- Oral - Thin Straw -- Oral - Puree -- Oral - Mech Soft -- Oral - Regular -- Oral - Multi-Consistency -- Oral - Pill -- Oral Phase - Comment --  CHL IP PHARYNGEAL PHASE 10/31/2015 Pharyngeal Phase Impaired Pharyngeal- Pudding Teaspoon -- Pharyngeal -- Pharyngeal- Pudding Cup -- Pharyngeal -- Pharyngeal- Honey Teaspoon -- Pharyngeal -- Pharyngeal- Honey Cup -- Pharyngeal -- Pharyngeal- Nectar Teaspoon -- Pharyngeal -- Pharyngeal- Nectar Cup -- Pharyngeal -- Pharyngeal- Nectar Straw -- Pharyngeal -- Pharyngeal- Thin Teaspoon -- Pharyngeal -- Pharyngeal- Thin Cup Penetration/Aspiration during swallow;Reduced airway/laryngeal closure;Reduced laryngeal elevation;Reduced epiglottic inversion;Pharyngeal residue - valleculae;Pharyngeal residue - pyriform;Reduced tongue base retraction Pharyngeal Material enters airway, remains ABOVE vocal cords then ejected  out Pharyngeal- Thin Straw Reduced epiglottic inversion;Reduced laryngeal elevation;Reduced airway/laryngeal closure;Reduced tongue base retraction;Penetration/Aspiration during swallow;Pharyngeal residue - valleculae;Pharyngeal residue - pyriform Pharyngeal Material enters airway, CONTACTS cords and not ejected out Pharyngeal- Puree Delayed swallow initiation-vallecula;Pharyngeal residue - valleculae;Reduced tongue base retraction Pharyngeal -- Pharyngeal- Mechanical Soft -- Pharyngeal -- Pharyngeal- Regular Delayed swallow initiation-vallecula;Reduced tongue base retraction;Pharyngeal residue - valleculae Pharyngeal -- Pharyngeal- Multi-consistency -- Pharyngeal -- Pharyngeal- Pill -- Pharyngeal -- Pharyngeal Comment --  CHL IP CERVICAL ESOPHAGEAL PHASE 10/31/2015 Cervical Esophageal Phase WFL Pudding Teaspoon -- Pudding Cup -- Honey Teaspoon -- Honey Cup -- Nectar Teaspoon -- Nectar Cup -- Nectar Straw --  Thin Teaspoon -- Thin Cup -- Thin Straw -- Puree -- Mechanical Soft -- Regular -- Multi-consistency -- Pill -- Cervical Esophageal Comment -- No flowsheet data found. Royce Macadamia 10/31/2015, 12:02 PM  Breck Coons [place].Ed CCC-SLP Pager [phone]               Scheduled Meds: . budesonide-formoterol  2 puff Inhalation BID  . ceFEPime (MAXIPIME) IV  1 g Intravenous Q12H  . Chlorhexidine Gluconate Cloth  6 each Topical Q0600  . clopidogrel  75 mg Oral Q breakfast  . escitalopram  20 mg Oral Daily  . heparin  5,000 Units Subcutaneous 3 times per day  . hydrocortisone   Rectal QID  . insulin aspart  0-5 Units Subcutaneous TID WC  . insulin aspart  0-5 Units Subcutaneous QHS  . insulin aspart  3 Units Subcutaneous TID WC  . insulin glargine  12 Units Subcutaneous Daily  . levETIRAcetam  1,000 mg Oral BID  . levothyroxine  75 mcg Oral QAC breakfast  . loperamide  2 mg Oral BID  . mupirocin ointment  1 application Nasal BID  . pravastatin  20 mg Oral q1800  . pregabalin  100 mg Oral QHS  .  pregabalin  50 mg Oral Daily  . saccharomyces boulardii  250 mg Oral QPM  . tiotropium  18 mcg Inhalation Daily  . vancomycin  750 mg Intravenous Q24H  . vitamin B-12  1,000 mcg Oral Daily   Continuous Infusions:   Antibiotics Given (last 72 hours)    Date/Time Action Medication Dose Rate   10/29/15 0624 Given   ceFEPIme (MAXIPIME) 1 g in dextrose 5 % 50 mL IVPB 1 g 100 mL/hr   10/29/15 1716 Given   vancomycin (VANCOCIN) 500 mg in sodium chloride 0.9 % 100 mL IVPB 500 mg 100 mL/hr   10/30/15 1478 Given   ceFEPIme (MAXIPIME) 1 g in dextrose 5 % 50 mL IVPB 1 g 100 mL/hr   10/30/15 1709 Given   vancomycin (VANCOCIN) IVPB 750 mg/150 ml premix 750 mg 150 mL/hr   10/31/15 0605 Given   ceFEPIme (MAXIPIME) 1 g in dextrose 5 % 50 mL IVPB 1 g 100 mL/hr      Principal Problem:   AKI (acute kidney injury) (HCC) Active Problems:   Type 1 diabetes mellitus with neurological manifestations, uncontrolled (HCC)   Essential hypertension   COPD (chronic obstructive pulmonary disease) (HCC)   CAD- moderate3V disease at cath 7/15- medical Rx   Seizures (HCC)   Dehydration   Diarrhea   Acute encephalopathy   Hypotension   Slurred speech    Time spent:    [facility]  [facility] Pager [phone]. If 7PM-7AM, please contact night-coverage at [email or web address], password [facility] 10/31/2015, 1:35 PM  LOS: 2 days

## 2015-10-31 NOTE — Progress Notes (Signed)
MBSS complete. Full report located under chart review in imaging section.  Recommendation: regular texture, thin, No straws, alternate liquids solids, stay upright 45 min after meals due to significant esophageal stasis observed.   Breck Coons [place].Ed [employer] [phone]

## 2015-10-31 NOTE — Progress Notes (Signed)
Pharmacy Antibiotic Note  Benjamin Hodge is a 75 y.o. male admitted on 10/28/2015 with pneumonia.  Pharmacy has been consulted for cefepime and vancomycin dosing.  Pt received vancomycin 1g and zosyn 3.375g IV once in the ED. Pt with acute renal failure with peak cr 2.7 now improved 1 with crcl approx 73ml/min Will increase abx doses  Plan: Vancomycin 500mg  q12 Cefepime 1gm q12 Monitor culture data, renal function and clinical course VT at SS prn  Height: 5\' 6"  (167.6 cm) Weight: 128 lb 4.9 oz (58.2 kg) IBW/kg (Calculated) : 63.8  Temp (24hrs), Avg:97.8 F (36.6 C), Min:97.3 F (36.3 C), Max:98.3 F (36.8 C)   Recent Labs Lab 10/28/15 1440 10/28/15 1846 10/28/15 2013 10/29/15 0721 10/29/15 1155 10/29/15 1456 10/29/15 2335 10/30/15 0406 10/31/15 0445  WBC 8.9  --   --  7.9  --   --   --   --  4.4  CREATININE 2.36*  --   --  2.69*  2.74* 2.18* 1.94* 1.55* 1.42* 1.06  LATICACIDVEN  --  0.9 1.1 1.6  --   --   --   --   --     Estimated Creatinine Clearance: 51.9 mL/min (by C-G formula based on Cr of 1.06).    Allergies  Allergen Reactions  . Ace Inhibitors Other (See Comments)    dizzy  . Cymbalta [Duloxetine Hcl] Other (See Comments)    dizzy  . Lipitor [Atorvastatin] Other (See Comments)    Caused pain all over body.  . Bee Venom Swelling  . Glucerna [Nutritional Supplements] Diarrhea  . Angiotensin Receptor Blockers Other (See Comments)    unknown    Antimicrobials this admission: Cefepime 2/21 >>  Vanc 2/21 >>  Zosyn 2/21  Dose adjustments this admission: n/a  Microbiology results: 2/21 BCx: ngtd 2/21 UCx: ngF  Sputum:    MRSA PCR: +   Leota Sauers Pharm.D. CPP, BCPS Clinical Pharmacist [phone] 10/31/2015 11:27 AM

## 2015-10-31 NOTE — Progress Notes (Signed)
Speech Language Pathology  Patient Details Name: Benjamin Hodge MRN: [id number] DOB: 29-Nov-1940 Today's Date: 10/31/2015 Time:  -     Pt familiar to this SLP. RN reported approximately 6 admissions for pna since Dec. Risk factors for dysphagia include GERD, CVA, COPD. MBS scheduled for today at 10:30.     Breck Coons [place].Ed [employer] [phone]

## 2015-10-31 NOTE — Progress Notes (Signed)
Pt blood sugar 40 at 2222. Pt alert and talking. Gave pt orange juice, peanut butter and crackers. Rechecked was 70 at 2248. Notified on call doctor of these findings.

## 2015-10-31 NOTE — Progress Notes (Signed)
Inpatient Diabetes Program Recommendations  AACE/ADA: New Consensus Statement on Inpatient Glycemic Control (2015)  Target Ranges:  Prepandial:   less than 140 mg/dL      Peak postprandial:   less than 180 mg/dL (1-2 hours)      Critically ill patients:  140 - 180 mg/dL   Review of Glycemic Control:  Results for STILES, ALPERS (MRN [id number]) as of 10/31/2015 09:46  Ref. Range 10/30/2015 11:45 10/30/2015 16:53 10/30/2015 21:47 10/31/2015 02:25 10/31/2015 07:44  Glucose-Capillary Latest Ref Range: 65-99 mg/dL 887 (H) 579 (H) 728 (H) 235 (H) 267 (H)    Inpatient Diabetes Program Recommendations:    Blood sugars stable.  Lantus increased to 12 units for today which will help.  No recs.  Patient will need clear directions when he goes to facility regarding "not" holding basal insulin and making sure that CHO coverage is given.  Note that patient is sensitive to both omission of insulin and large doses of insulin.    Thanks, Beryl Meager, RN, BC-ADM Inpatient Diabetes Coordinator Pager [phone] (8a-5p)

## 2015-10-31 NOTE — Plan of Care (Signed)
Problem: SLP Dysphagia Goals Goal: Patient will utilize recommended strategies Patient will utilize recommended strategies during swallow to increase swallowing safety with esophageal strategies, swallow twice after intermittent sips/bites

## 2015-11-01 DIAGNOSIS — R197 Diarrhea, unspecified: Secondary | ICD-10-CM

## 2015-11-01 LAB — GLUCOSE, CAPILLARY
GLUCOSE-CAPILLARY: 266 mg/dL — AB (ref 65–99)
GLUCOSE-CAPILLARY: 296 mg/dL — AB (ref 65–99)
GLUCOSE-CAPILLARY: 262 mg/dL — AB (ref 65–99)
GLUCOSE-CAPILLARY: 87 mg/dL (ref 65–99)
Glucose-Capillary: 161 mg/dL — ABNORMAL HIGH (ref 65–99)
Glucose-Capillary: 234 mg/dL — ABNORMAL HIGH (ref 65–99)

## 2015-11-01 MED ORDER — LEVOFLOXACIN 500 MG PO TABS
500.0000 mg | ORAL_TABLET | Freq: Every day | ORAL | Status: DC
  Administered 2015-11-01 – 2015-11-03 (×3): 500 mg via ORAL
  Filled 2015-11-01 (×3): qty 1

## 2015-11-01 MED ORDER — INSULIN GLARGINE 100 UNIT/ML ~~LOC~~ SOLN
10.0000 [IU] | Freq: Every day | SUBCUTANEOUS | Status: DC
  Administered 2015-11-02: 10 [IU] via SUBCUTANEOUS
  Filled 2015-11-01 (×3): qty 0.1

## 2015-11-01 MED ORDER — VANCOMYCIN HCL 500 MG IV SOLR
500.0000 mg | Freq: Two times a day (BID) | INTRAVENOUS | Status: DC
  Administered 2015-11-01 – 2015-11-02 (×2): 500 mg via INTRAVENOUS
  Filled 2015-11-01 (×3): qty 500

## 2015-11-01 NOTE — Progress Notes (Signed)
[facility] PROGRESS NOTE  KELSO BIBBY ONG:[id number] DOB: May 27, 1941 DOA: 10/28/2015 PCP: Kimber Relic, MD  Assessment/Plan: 1. HCAP/Aspiration pneumonia -SLP eval completed last admission, was treated with levaquin then -back on broad spectrum Abx since admission, Day3,  -Blood Cx-NGTD x2 day -MBS completed 2/23: mild dysphagia noted, Regular diet recommended and swallowing strategies recommended -Incentive spirometry -change to PO levaquin today  2. Chronic Diarrhea -immodium PRN  3. Mild DKA/uncontrolled DM -off insulin gtt,  -continue Lantus, meal coverage and customized SSI, low CBG last pm, will cut down lantus dose to 10units -Carb mod Diet -D/w DM coordinator, sees Dr.Kerr, difficulty managing Brittle DM at ALF  4. Seizures -continue Keppra  5. R Renal mass -needs Urology FU, seen by Dr.Grapey 3 weeks ago -interval increase in size  6. Metabolic encephalopathy -due to infection/DKA/Dementia -back to baseline now  7. Dementia -now stable  8. Abd pain/Diarrhea -has long standing diarrhea for >15years, takes immodium -Cdiff negative -per Pt workup negative, was told may have IBS -CT with mild thickening of terminal ileum/cecum, likely gastroenteritis-GI pathogen panel negative, Cdiff negative -resolved, tolerating regular diet -h/o extensive GI workup which was unrevealing  9. AKI on CKD3 -resolved with hydration  10. Elevated Troponin -no chest pain, due to above infections/DKA -repeat troponin improved, 0.08, non ACS pattern  DVT proph: lovenox  Code Status: Full Code Family Communication: none at bedside, left msg for son [name] Disposition Plan: SNF in 1-2days   HPI/Subjective: No evenmts, diarrhea persists, breathing ok  Objective: Filed Vitals:   11/01/15 0013 11/01/15 0453  BP: 163/76 166/94  Pulse: 80 80  Temp: 98.6 F (37 C) 98.5 F (36.9 C)  Resp: 18 18    Intake/Output Summary (Last 24 hours) at 11/01/15 0936 Last  data filed at 11/01/15 4401  Gross per 24 hour  Intake   1040 ml  Output   2550 ml  Net  -1510 ml   Filed Weights   10/30/15 0300 10/31/15 0331 10/31/15 1848  Weight: 60.6 kg (133 lb 9.6 oz) 58.2 kg (128 lb 4.9 oz) 59.6 kg (131 lb 6.3 oz)    Exam:   General:  AAOx3, no distress  Cardiovascular: S1S2/RRR  Respiratory: CTAB  Abdomen: soft, NT, BS present  Musculoskeletal: no edema c/c  Neuro: non focal  Data Reviewed: Basic Metabolic Panel:  Recent Labs Lab 10/29/15 1155 10/29/15 1456 10/29/15 2335 10/30/15 0406 10/31/15 0445  NA 138 140 139 140 142  K 3.4* 3.0* 3.7 4.1 3.8  CL 108 107 109 111 108  CO2 18* 20* 18* 18* 22  GLUCOSE 220* 137* 135* 125* 244*  BUN 48* 46* 37* 33* 19  CREATININE 2.18* 1.94* 1.55* 1.42* 1.06  CALCIUM 8.4* 8.7* 8.6* 8.7* 8.9   Liver Function Tests:  Recent Labs Lab 10/28/15 1440 10/29/15 0721  AST 25 19  ALT 22 20  ALKPHOS 87 82  BILITOT 0.3 1.2  PROT 6.9 5.6*  ALBUMIN 3.5 3.0*    Recent Labs Lab 10/28/15 1440  LIPASE 33   No results for input(s): AMMONIA in the last 168 hours. CBC:  Recent Labs Lab 10/28/15 1440 10/29/15 0721 10/31/15 0445  WBC 8.9 7.9 4.4  NEUTROABS 6.0  --   --   HGB 9.2* 9.3* 9.5*  HCT 26.7* 29.1* 29.0*  MCV 93.0 95.1 92.1  PLT 245 254 194   Cardiac Enzymes:  Recent Labs Lab 10/29/15 0721 10/29/15 1155 10/29/15 1944 10/30/15 1655  TROPONINI <0.03 0.06* 0.12* 0.08*  BNP (last 3 results)  Recent Labs  10/20/15 1043  BNP 280.3*    ProBNP (last 3 results) No results for input(s): PROBNP in the last 8760 hours.  CBG:  Recent Labs Lab 10/31/15 2248 10/31/15 2313 11/01/15 0028 11/01/15 0344 11/01/15 0752  GLUCAP 70 151* 161* 266* 296*    Recent Results (from the past 240 hour(s))  Urine culture     Status: None   Collection Time: 10/28/15  6:07 PM  Result Value Ref Range Status   Specimen Description URINE, CATHETERIZED  Final   Special Requests NONE  Final    Culture NO GROWTH 1 DAY  Final   Report Status 10/29/2015 FINAL  Final  Culture, blood (routine x 2)     Status: None (Preliminary result)   Collection Time: 10/29/15 12:55 AM  Result Value Ref Range Status   Specimen Description BLOOD RIGHT HAND  Final   Special Requests BOTTLES DRAWN AEROBIC ONLY 5CC  Final   Culture NO GROWTH 2 DAYS  Final   Report Status PENDING  Incomplete  Culture, blood (routine x 2)     Status: None (Preliminary result)   Collection Time: 10/29/15  1:03 AM  Result Value Ref Range Status   Specimen Description BLOOD RIGHT ARM  Final   Special Requests IN PEDIATRIC BOTTLE 2CC  Final   Culture NO GROWTH 2 DAYS  Final   Report Status PENDING  Incomplete  MRSA PCR Screening     Status: Abnormal   Collection Time: 10/29/15  5:32 AM  Result Value Ref Range Status   MRSA by PCR POSITIVE (A) NEGATIVE Final    Comment:        The GeneXpert MRSA Assay (FDA approved for NASAL specimens only), is one component of a comprehensive MRSA colonization surveillance program. It is not intended to diagnose MRSA infection nor to guide or monitor treatment for MRSA infections. RESULT CALLED TO, READ BACK BY AND VERIFIED WITH: S. MOORE RN 10/29/15 AT 0815 BY A. DAVIS   C difficile quick scan w PCR reflex     Status: None   Collection Time: 10/29/15  7:30 AM  Result Value Ref Range Status   C Diff antigen NEGATIVE NEGATIVE Final   C Diff toxin NEGATIVE NEGATIVE Final   C Diff interpretation Negative for toxigenic C. difficile  Final  Gastrointestinal Panel by PCR , Stool     Status: None   Collection Time: 10/29/15  7:30 AM  Result Value Ref Range Status   Campylobacter species NOT DETECTED NOT DETECTED Final   Plesimonas shigelloides NOT DETECTED NOT DETECTED Final   Salmonella species NOT DETECTED NOT DETECTED Final   Yersinia enterocolitica NOT DETECTED NOT DETECTED Final   Vibrio species NOT DETECTED NOT DETECTED Final   Vibrio cholerae NOT DETECTED NOT DETECTED  Final   Enteroaggregative E coli (EAEC) NOT DETECTED NOT DETECTED Final   Enteropathogenic E coli (EPEC) NOT DETECTED NOT DETECTED Final   Enterotoxigenic E coli (ETEC) NOT DETECTED NOT DETECTED Final   Shiga like toxin producing E coli (STEC) NOT DETECTED NOT DETECTED Final   E. coli O157 NOT DETECTED NOT DETECTED Final   Shigella/Enteroinvasive E coli (EIEC) NOT DETECTED NOT DETECTED Final   Cryptosporidium NOT DETECTED NOT DETECTED Final   Cyclospora cayetanensis NOT DETECTED NOT DETECTED Final   Entamoeba histolytica NOT DETECTED NOT DETECTED Final   Giardia lamblia NOT DETECTED NOT DETECTED Final   Adenovirus F40/41 NOT DETECTED NOT DETECTED Final   Astrovirus NOT DETECTED  NOT DETECTED Final   Norovirus GI/GII NOT DETECTED NOT DETECTED Final   Rotavirus A NOT DETECTED NOT DETECTED Final   Sapovirus (I, II, IV, and V) NOT DETECTED NOT DETECTED Final     Studies: Dg Swallowing Func-speech Pathology  10/31/2015  Objective Swallowing Evaluation: Type of Study: MBS-Modified Barium Swallow Study Patient Details Name: JEET HEARING MRN: [id number] Date of Birth: [birth year] Today's Date: 10/31/2015 Time: SLP Start Time (ACUTE ONLY): 1032-SLP Stop Time (ACUTE ONLY): 1047 SLP Time Calculation (min) (ACUTE ONLY): 15 min Past Medical History: Past Medical History Diagnosis Date . CAD (coronary artery disease)    LAD 40-50% stenosis, first diagonal small 90% stenosis, circumflex 70% stenosis, OM 80% stenosis, right coronary artery 80-90% stenosis. . Right upper lobe pneumonia 11/28/2008 . Anxiety 07/18/2008 . Irritable bowel syndrome 04/27/2008 . COPD (chronic obstructive pulmonary disease) (HCC) 02/27/2008 . B12 deficiency 02/16/2008   in 5/09: B12 262, MMA 1040 . Chronic diarrhea 01/01/2008   s/p EGD 10/06: H pylori + gastritis, duodenal biopsy normal (Dr. Elnoria Howard); s/p colonoscopy 10/06: hemorrhoids (Dr. Elnoria Howard); evaluated by Dr. Yancey Flemings  in 8/09: tissue transglutaminase AB negative, VIP normal, stool  fat content normal, urine 5-HIAA normal; normal GES 11/09 (done for "fluctuating sugars") . 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 Orthostatic hypotension 02/09/2007 . Hypertension 02/09/2007 . Spinal stenosis in cervical region 05/03   MRI . Erectile dysfunction 09/27/2006   s/p penile implant . Anemia, iron deficiency 09/27/2006   neg colonoscopy 2006 - Dr. Elnoria Howard; ferritin 152; hgb  15.7  on 07/07 . Hypothyroidism 09/27/2006   TSH 2.639  07/07 . Hypersomnia 09/27/2006   evaluated by Dr. Jetty Duhamel (5/08) and Dr. Vickey Huger; PSG 8/09: chronic delayed sleep phase syndrome (patient sleeps during the day and is awake at night), nocturnal myoclonus (eval for RLS and IDA suggested). Pt advised to change sleeping behavior . Diabetes mellitus type II 09/07/1984   poorly controlled, complicated by peripheral neuropathy, microalbuminuria, mild non proliferative retinopathy, s/p DKA 7/05, on insulin pump x 4/09, started a pump vacation on 11/19/2008 . Hyperlipidemia  . Hypogonadism male 08/2011 . Hemorrhoids  . Chronic kidney disease  . Unspecified hereditary and idiopathic peripheral neuropathy  . Other chronic pain  . Other malaise and fatigue  . Depression  . Hypertrophy of prostate without urinary obstruction and other lower urinary tract symptoms (LUTS)  . Lumbago  . Heart attack (HCC) 03/2014   mild . Renal mass, right: Incentendal finding MRI 10/02/2015 10/03/2015 . Hypogonadism in male 10/03/2015 . Asthma  Past Surgical History: Past Surgical History Procedure Laterality Date . Penile prosthesis implant   . Tonsillectomy   . Colonoscopy  06/25/2005   Dr. Jeani Hawking . Left heart catheterization with coronary angiogram [name] 03/13/2014   Procedure: LEFT HEART CATHETERIZATION WITH CORONARY ANGIOGRAM;  Surgeon: Peter M Swaziland, MD;  [facility] C[facility] LAB;  Service: Cardiovascular;  Laterality: [name]; . Peripheral vascular catheterization [name] 02/19/2015   Procedure: Abdominal Aortogram;  Surgeon: Nada Libman, MD;  Location: [facility] INVASIVE CV LAB;  Service: Cardiovascular;  Laterality: [name]; . Peripheral vascular catheterization Left 02/19/2015   Procedure: Lower Extremity Angiography;  Surgeon: Nada Libman, MD;  Location: [facility] INVASIVE CV LAB;  Service: Cardiovascular;  Laterality: Left; HPI: 75 y.o. male with h/o CAD, recurrent pneumonia, COPD, HTN, DM type 2, HLD, depression, asthma and "mild" heart attack (2015) who presented to ED 2/20 after syncopal episode at endocrinologist's office. Pt admitted last week for pneumonia and uncontrolled DM. CT Head  2/20 negative for bleed or other acute intracranial process. CXR 2/20 newly seen L base infiltrate consistent with bronchopneumonia. BSE 10/21/2015 recommended regular diet and thin liquids. No Data Recorded Assessment / Plan / Recommendation CHL IP CLINICAL IMPRESSIONS 10/31/2015 Therapy Diagnosis Mild pharyngeal phase dysphagia Clinical Impression Pt exhibited mild pharyngeal phase dysphagia characterized by sensorimotor deficits. During intake of thin liquids via straw, silent penetration to the vocal folds observed due to decreased laryngeal elevation and epiglottic deflection. SLP provided min verbal cues to clear. Further penetration episodes prevented with removal of straw. Min residue noted in the vallecula and pyriform sinuses and with min verbal cues pt able to initiate volitional swallow to clear. Mastication WFL. MBS does not diagnose MBS below UES, however scan of esophagus noted esophageal stasis appearing in distal toward mid esophagus. Pt educated re: diet recommendation of regular diet and thin liquids (no straws), meds whole in puree and esophageal precautions (eat upright, stay upright at least 45 minutes after PO intake, alternate solids and liquids) as well as swallow 2x intermittently throughout meal to clear residue. SLP will f/u to determine diet toleration. Suggest assessment of esophagus to evaluate stasis. Impact on safety and function Mild  aspiration risk   CHL IP TREATMENT RECOMMENDATION 10/31/2015 Treatment Recommendations Therapy as outlined in treatment plan below   Prognosis 10/31/2015 Prognosis for Safe Diet Advancement Good Barriers to Reach Goals -- Barriers/Prognosis Comment -- CHL IP DIET RECOMMENDATION 10/31/2015 SLP Diet Recommendations Regular solids;Thin liquid Liquid Administration via No straw;Cup Medication Administration Whole meds with puree Compensations Minimize environmental distractions;Slow rate;Small sips/bites;Follow solids with liquid Postural Changes Seated upright at 90 degrees;Remain semi-upright after after feeds/meals (Comment)   CHL IP OTHER RECOMMENDATIONS 10/31/2015 Recommended Consults Consider esophageal assessment Oral Care Recommendations Oral care BID Other Recommendations --   CHL IP FOLLOW UP RECOMMENDATIONS 10/31/2015 Follow up Recommendations (No Data)   CHL IP FREQUENCY AND DURATION 10/31/2015 Speech Therapy Frequency (ACUTE ONLY) min 2x/week Treatment Duration 2 weeks      CHL IP ORAL PHASE 10/31/2015 Oral Phase WFL Oral - Pudding Teaspoon -- Oral - Pudding Cup -- Oral - Honey Teaspoon -- Oral - Honey Cup -- Oral - Nectar Teaspoon -- Oral - Nectar Cup -- Oral - Nectar Straw -- Oral - Thin Teaspoon -- Oral - Thin Cup -- Oral - Thin Straw -- Oral - Puree -- Oral - Mech Soft -- Oral - Regular -- Oral - Multi-Consistency -- Oral - Pill -- Oral Phase - Comment --  CHL IP PHARYNGEAL PHASE 10/31/2015 Pharyngeal Phase Impaired Pharyngeal- Pudding Teaspoon -- Pharyngeal -- Pharyngeal- Pudding Cup -- Pharyngeal -- Pharyngeal- Honey Teaspoon -- Pharyngeal -- Pharyngeal- Honey Cup -- Pharyngeal -- Pharyngeal- Nectar Teaspoon -- Pharyngeal -- Pharyngeal- Nectar Cup -- Pharyngeal -- Pharyngeal- Nectar Straw -- Pharyngeal -- Pharyngeal- Thin Teaspoon -- Pharyngeal -- Pharyngeal- Thin Cup Penetration/Aspiration during swallow;Reduced airway/laryngeal closure;Reduced laryngeal elevation;Reduced epiglottic inversion;Pharyngeal  residue - valleculae;Pharyngeal residue - pyriform;Reduced tongue base retraction Pharyngeal Material enters airway, remains ABOVE vocal cords then ejected out Pharyngeal- Thin Straw Reduced epiglottic inversion;Reduced laryngeal elevation;Reduced airway/laryngeal closure;Reduced tongue base retraction;Penetration/Aspiration during swallow;Pharyngeal residue - valleculae;Pharyngeal residue - pyriform Pharyngeal Material enters airway, CONTACTS cords and not ejected out Pharyngeal- Puree Delayed swallow initiation-vallecula;Pharyngeal residue - valleculae;Reduced tongue base retraction Pharyngeal -- Pharyngeal- Mechanical Soft -- Pharyngeal -- Pharyngeal- Regular Delayed swallow initiation-vallecula;Reduced tongue base retraction;Pharyngeal residue - valleculae Pharyngeal -- Pharyngeal- Multi-consistency -- Pharyngeal -- Pharyngeal- Pill -- Pharyngeal -- Pharyngeal Comment --  CHL IP CERVICAL ESOPHAGEAL PHASE 10/31/2015  Cervical Esophageal Phase WFL Pudding Teaspoon -- Pudding Cup -- Honey Teaspoon -- Honey Cup -- Nectar Teaspoon -- Nectar Cup -- Nectar Straw -- Thin Teaspoon -- Thin Cup -- Thin Straw -- Puree -- Mechanical Soft -- Regular -- Multi-consistency -- Pill -- Cervical Esophageal Comment -- No flowsheet data found. Royce Macadamia 10/31/2015, 12:02 PM  Benjamin Hodge Lonell Face.Ed CCC-SLP Pager [phone]               Scheduled Meds: . budesonide-formoterol  2 puff Inhalation BID  . Chlorhexidine Gluconate Cloth  6 each Topical Q0600  . clopidogrel  75 mg Oral Q breakfast  . escitalopram  20 mg Oral Daily  . heparin  5,000 Units Subcutaneous 3 times per day  . insulin aspart  0-5 Units Subcutaneous TID WC  . insulin aspart  0-5 Units Subcutaneous QHS  . insulin aspart  3 Units Subcutaneous TID WC  . insulin glargine  10 Units Subcutaneous Daily  . levETIRAcetam  1,000 mg Oral BID  . levofloxacin  500 mg Oral Daily  . levothyroxine  75 mcg Oral QAC breakfast  . loperamide  2 mg Oral BID  .  mupirocin ointment  1 application Nasal BID  . pravastatin  20 mg Oral q1800  . pregabalin  100 mg Oral QHS  . pregabalin  50 mg Oral Daily  . saccharomyces boulardii  250 mg Oral QPM  . tiotropium  18 mcg Inhalation Daily  . vitamin B-12  1,000 mcg Oral Daily   Continuous Infusions:   Antibiotics Given (last 72 hours)    Date/Time Action Medication Dose Rate   10/29/15 1716 Given   vancomycin (VANCOCIN) 500 mg in sodium chloride 0.9 % 100 mL IVPB 500 mg 100 mL/hr   10/30/15 4540 Given   ceFEPIme (MAXIPIME) 1 g in dextrose 5 % 50 mL IVPB 1 g 100 mL/hr   10/30/15 1709 Given   vancomycin (VANCOCIN) IVPB 750 mg/150 ml premix 750 mg 150 mL/hr   10/31/15 9811 Given   ceFEPIme (MAXIPIME) 1 g in dextrose 5 % 50 mL IVPB 1 g 100 mL/hr   10/31/15 1725 Given   vancomycin (VANCOCIN) IVPB 750 mg/150 ml premix 750 mg 150 mL/hr   11/01/15 0019 Given   ceFEPIme (MAXIPIME) 1 g in dextrose 5 % 50 mL IVPB 1 g 100 mL/hr   11/01/15 0902 Given   ceFEPIme (MAXIPIME) 1 g in dextrose 5 % 50 mL IVPB 1 g 100 mL/hr      Principal Problem:   AKI (acute kidney injury) (HCC) Active Problems:   Type 1 diabetes mellitus with neurological manifestations, uncontrolled (HCC)   Essential hypertension   COPD (chronic obstructive pulmonary disease) (HCC)   CAD- moderate3V disease at cath 7/15- medical Rx   Seizures (HCC)   Dehydration   Diarrhea   Acute encephalopathy   Hypotension   Slurred speech    Time spent:    [facility]  [facility] Pager [phone]. If 7PM-7AM, please contact night-coverage at [email or web address], password [facility] 11/01/2015, 9:36 AM  LOS: 3 days

## 2015-11-01 NOTE — Clinical Social Work Note (Signed)
BSW intern has met with patient to present bed offers. Patient would like to discuss facilities with son before making a final decision. BSW intern to f/u with patient at a later time to discuss facility placement.   BSW intern remains available.  [place] intern [phone]

## 2015-11-01 NOTE — Progress Notes (Signed)
Physical Therapy Treatment Patient Details Name: Benjamin Hodge MRN: [id number] DOB: [birth year] Today's Date: 11/01/2015    History of Present Illness 75 y.o. male admitted on 10/28/2015 with pneumonia    PT Comments    Pt performed increased gait distance with poor safety awareness.  Pt required RW for gait training to improve balance.  Pt demonstrates decreased awareness of lines and leads.    Follow Up Recommendations  SNF;Supervision/Assistance - 24 hour     Equipment Recommendations  Rolling walker with 5" wheels    Recommendations for Other Services       Precautions / Restrictions Precautions Precautions: Fall Restrictions Weight Bearing Restrictions: No    Mobility  Bed Mobility Overal bed mobility:  (Pt recieved in recliner chair.  )                Transfers Overall transfer level: Needs assistance Equipment used: Rolling walker (2 wheeled) Transfers: Sit to/from Stand Sit to Stand: Min assist Stand pivot transfers: Min assist       General transfer comment: Req min assist to maintain balance and boosting hips into standing.  pt demonstrates decreased ability to follow commands consistently.  MAx VCs for hand placement to improve safety during tx.    Ambulation/Gait Ambulation/Gait assistance: Min assist Ambulation Distance (Feet): 300 Feet Assistive device: Rolling walker (2 wheeled) Gait Pattern/deviations: Step-through pattern;Decreased stride length;Trunk flexed Gait velocity: decreased, guarded due to back pain and neuropathy   General Gait Details: Pt improved increased activity from previous session.  pt reports back pain limits mobility.  Pt required cues for RW position and safety during tx.  pt is impulsive and lacks understanding of awareness of foley and IVs.     Stairs            Wheelchair Mobility    Modified Rankin (Stroke Patients Only)       Balance     Sitting balance-Leahy Scale: Good       Standing  balance-Leahy Scale: Poor                      Cognition Arousal/Alertness: Awake/alert Behavior During Therapy: WFL for tasks assessed/performed;Impulsive Overall Cognitive Status: No family/caregiver present to determine baseline cognitive functioning Area of Impairment: Attention;Following commands;Safety/judgement;Awareness;Problem solving     Memory: Decreased short-term memory Following Commands: Follows one step commands with increased time Safety/Judgement: Decreased awareness of safety Awareness: Intellectual Problem Solving: Slow processing;Decreased initiation;Difficulty sequencing;Requires verbal cues;Requires tactile cues General Comments: Patient required maximal cues to come to EOB with increased instruction given. Patient with poor self awareness regarding incontinence and importance of hygiene. Patient also had difficulty following commands for use of telephone to call to order his lunch despite increased assist and verbal cues for task completion.     Exercises      General Comments        Pertinent Vitals/Pain Pain Assessment: No/denies pain Faces Pain Scale: Hurts little more Pain Location: low back Pain Descriptors / Indicators: Burning Pain Intervention(s): Limited activity within patient's tolerance;Repositioned    Home Living                      Prior Function            PT Goals (current goals can now be found in the care plan section) Acute Rehab PT Goals Potential to Achieve Goals: Good Progress towards PT goals: Progressing toward goals    Frequency  Min 2X/week  PT Plan Current plan remains appropriate    Co-evaluation             End of Session Equipment Utilized During Treatment: Gait belt Activity Tolerance: Patient tolerated treatment well Patient left: in chair;with call bell/phone within reach;with chair alarm set     Time: 0952-1020 PT Time Calculation (min) (ACUTE ONLY): 28 min  Charges:  $Gait  Training: 8-22 mins $Therapeutic Activity: 8-22 mins                    G Codes:      Florestine Avers 2015-11-09, 10:34 AM  Joycelyn Rua, PTA pager [phone]

## 2015-11-01 NOTE — Clinical Social Work Placement (Signed)
   CLINICAL SOCIAL WORK PLACEMENT  NOTE  Date:  11/01/2015  Patient Details  Name: Benjamin Hodge MRN: [id number] Date of Birth: [birth year]  Clinical Social Work is seeking post-discharge placement for this patient at the Skilled  Nursing Facility level of care (*CSW will initial, date and re-position this form in  chart as items are completed):  Yes   Patient/family provided with [facility] Clinical Social Work Department's list of facilities offering this level of care within the geographic area requested by the patient (or if unable, by the patient's family).  Yes   Patient/family informed of their freedom to choose among providers that offer the needed level of care, that participate in Medicare, Medicaid or managed care program needed by the patient, have an available bed and are willing to accept the patient.  Yes   Patient/family informed of 's ownership interest in [facility] and [facility], as well as of the fact that they are under no obligation to receive care at these facilities.  PASRR submitted to EDS on 10/30/15     PASRR number received on 10/30/15     Existing PASRR number confirmed on       FL2 transmitted to all facilities in geographic area requested by pt/family on       FL2 transmitted to all facilities within larger geographic area on 10/30/15     Patient informed that his/her managed care company has contracts with or will negotiate with certain facilities, including the following:            Patient/family informed of bed offers received.  Patient chooses bed at       Physician recommends and patient chooses bed at      Patient to be transferred to   on  .  Patient to be transferred to facility by       Patient family notified on   of transfer.  Name of family member notified:        PHYSICIAN Please sign FL2     Additional Comment:    _______________________________________________ Darleene Cleaver,  LCSWA 11/01/2015, 11:58 AM

## 2015-11-01 NOTE — Progress Notes (Signed)
CRITICAL VALUE ALERT  Critical value received:  Blood culture positive for gram positive cocci in clusters.  Date of notification:  11/01/15  Time of notification:  1005  Critical value read back:Yes.    Nurse who received alert:  Stann Ore  MD notified (1st page):  Dr. Jomarie Longs  Time of first page:  1010  MD notified (2nd page):  Time of second page:  Responding MD:  Dr. Jomarie Longs  Time MD responded:  1010

## 2015-11-01 NOTE — Clinical Social Work Note (Signed)
Clinical Social Worker had lengthy conversation with pt's dtr-in-law in regards to discharge planning. CSW discussed bed offers, and that patient will likely be medically stable on Saturday, 2/25 per MD.  CSW remains available as needed.   Derenda Fennel, MSW, LCSWA [phone] 11/01/2015 2:45 PM

## 2015-11-01 NOTE — Care Management Important Message (Signed)
Important Message  Patient Details  Name: YAAKOV SEPPALA MRN: [id number] Date of Birth: 1941-08-24   Medicare Important Message Given:  Yes    Lawerance Sabal, RN 11/01/2015, 1:58 PMImportant Message  Patient Details  Name: EGE MAVITY MRN: [id number] Date of Birth: July 17, 1941   Medicare Important Message Given:  Yes    Lawerance Sabal, RN 11/01/2015, 1:58 PM

## 2015-11-01 NOTE — Progress Notes (Signed)
Pharmacy Antibiotic Note  Benjamin Hodge is a 75 y.o. male admitted on 10/28/2015 with weakness and LOC. The patient was started on Vancomycin + Cefepime for r/o aspiration/PNA and HCAP narrowed to Levaquin this morning. Blood cultures updated as 1/2 GPC in clusters and pharmacy has been reconsulted to resume Vancomycin while ruling out pathogen vs contaminant. SCr 1.06, CrCl~50-55 ml/min.   Plan: 1. Restart Vancomycin at 500 mg IV every 12 hours 2. Will continue to follow renal function, culture results, LOT, and antibiotic de-escalation plans   Height: 5\' 6"  (167.6 cm) Weight: 131 lb 6.3 oz (59.6 kg) IBW/kg (Calculated) : 63.8  Temp (24hrs), Avg:98.4 F (36.9 C), Min:97.8 F (36.6 C), Max:98.9 F (37.2 C)   Recent Labs Lab 10/28/15 1440 10/28/15 1846 10/28/15 2013 10/29/15 0721 10/29/15 1155 10/29/15 1456 10/29/15 2335 10/30/15 0406 10/31/15 0445  WBC 8.9  --   --  7.9  --   --   --   --  4.4  CREATININE 2.36*  --   --  2.69*  2.74* 2.18* 1.94* 1.55* 1.42* 1.06  LATICACIDVEN  --  0.9 1.1 1.6  --   --   --   --   --     Estimated Creatinine Clearance: 53.1 mL/min (by C-G formula based on Cr of 1.06).    Allergies  Allergen Reactions  . Ace Inhibitors Other (See Comments)    dizzy  . Cymbalta [Duloxetine Hcl] Other (See Comments)    dizzy  . Lipitor [Atorvastatin] Other (See Comments)    Caused pain all over body.  . Bee Venom Swelling  . Glucerna [Nutritional Supplements] Diarrhea  . Angiotensin Receptor Blockers Other (See Comments)    unknown    Antimicrobials this admission: Zosyn 2/20 x 1 Vanc 2/20 >>  Cefepime 2/20 >> 2/24 LVQ 2/24 >>  Dose adjustments this admission: n/a  Microbiology results: 2/21 MRSA PCR >> positive  2/21 CDiff >> negative 2/21 GI panel >> negative 2/21 UCx >> NG 2/21 BCx >> 1/2 GPC in clusters  Thank you for allowing pharmacy to be a part of this patient's care.  Georgina Pillion, PharmD, BCPS Clinical  Pharmacist Pager: [phone] 11/01/2015 10:41 AM

## 2015-11-01 NOTE — Progress Notes (Signed)
Speech Language Pathology Treatment: Dysphagia  Patient Details Name: Benjamin Hodge MRN: [id number] DOB: [birth year] Today's Date: 11/01/2015 Time: 7505-1071 SLP Time Calculation (min) (ACUTE ONLY): 24 min  Assessment / Plan / Recommendation Clinical Impression  Pt exhibited no overt s/s of penetration and no evidence of dysphagia.  SLP provided min verbal cues to take small sips/bites. Pt and daughter-in-law educated extensively on esophageal precautions (sit upright during meals, stay upright at least 45 minutes after meals, alternate solids and liquids). Pt to see GI doctor towards end of March. Recommend regular diet and thin liquids. SLP will sign off at this time.    HPI HPI: 75 y.o. male with h/o CAD, recurrent pneumonia, COPD, HTN, DM type 2, HLD, depression, asthma and "mild" heart attack (2015) who presented to ED 2/20 after syncopal episode at endocrinologist's office. Pt admitted last week for pneumonia and uncontrolled DM. CT Head 2/20 negative for bleed or other acute intracranial process. CXR 2/20 newly seen L base infiltrate consistent with bronchopneumonia. BSE 10/21/2015 recommended regular diet and thin liquids.      SLP Plan  All goals met     Recommendations  Diet recommendations: Regular;Thin liquid Liquids provided via: Cup;No straw Medication Administration: Whole meds with puree Supervision: Patient able to self feed;Intermittent supervision to cue for compensatory strategies Compensations: Minimize environmental distractions;Slow rate;Small sips/bites;Follow solids with liquid Postural Changes and/or Swallow Maneuvers: Seated upright 90 degrees;Upright 30-60 min after meal             Oral Care Recommendations: Oral care BID Follow up Recommendations: None Plan: All goals met     GO                Benjamin Hodge 11/01/2015, 3:40 PM

## 2015-11-01 NOTE — Care Management Note (Signed)
Case Management Note  Patient Details  Name: EUAN BARBERI MRN: [id number] Date of Birth: [birth year]  Subjective/Objective:                 Patient admitted from [facility]F.    Action/Plan:  Anticipate DC to SNF tomorrow.  Expected Discharge Date:                  Expected Discharge Plan:  Skilled Nursing Facility  In-House Referral:  Clinical Social Work  Discharge planning Services     Post Acute Care Choice:    Choice offered to:     DME Arranged:    DME Agency:     HH Arranged:    HH Agency:     Status of Service:  Completed, signed off  Medicare Important Message Given:  Yes Date Medicare IM Given:    Medicare IM give by:    Date Additional Medicare IM Given:    Additional Medicare Important Message give by:     If discussed at Long Length of Stay Meetings, dates discussed:    Additional Comments:  Lawerance Sabal, RN 11/01/2015, 1:58 PM

## 2015-11-01 NOTE — Clinical Social Work Note (Addendum)
Clinical Social Work Assessment  Patient Details  Name: Benjamin Hodge MRN: [id number] Date of Birth: [birth year]  Date of referral:  10/30/15               Reason for consult:  Facility Placement                Permission sought to share information with:  Family Supports, Magazine features editor Permission granted to share information::  Yes, Verbal Permission Granted  Name::     Benjamin Hodge [phone]  Agency::  snf admission  Relationship::     Contact Information:     Housing/Transportation Living arrangements for the past 2 months:  Skilled Nursing Facility Source of Information:  Adult Children Patient Interpreter Needed:  None Criminal Activity/Legal Involvement Pertinent to Current Situation/Hospitalization:  No - Comment as needed Significant Relationships:  Adult Children Lives with:  Facility Resident Do you feel safe going back to the place where you live?  No (Patient would like to go to SNF for rehab then possiblly transition to long term care.) Need for family participation in patient care:  Yes (Comment)  Care giving concerns:  Patient's son feels patient needs to go to SNF to receive therapy and proper care.   Social Worker assessment / plan:  CSW spoke with patient's son to complete assessment due to patient sleeping and not very arousable.  Patient's son states he has been at an ALF [facility] for about 3 weeks now, but he feels the care is not being provided for what patient needs.  Patient son states he would feel better with patient going to a SNF to monitor his insulin and diabetes better.  Patient's son expressed frustration that he has to worry about his dad's care while he is at ALF, and he does not want to have to worry about him.  Patient's son had questions regarding insurance and how he can change policy.  CSW informed him to contact insurance company but to wait till he has been discharged to SNF because it is very confusing and  complicated if patient switchs insurance while at the hospital.  Patient's son said he will wait till patient is in SNF to make changes.  Patient's son did not express any other concerns or issues.  Employment status:  Retired Database administrator PT Recommendations:  Skilled Nursing Facility Information / Referral to community resources:  Skilled Nursing Facility  Patient/Family's Response to care:  Patient and family are agreeable to going to a SNF for rehab Patient/Family's Understanding of and Emotional Response to Diagnosis, Current Treatment, and Prognosis:  Patient and son are awaare of current diagnosis and treatment plan.  Emotional Assessment Appearance:  Appears stated age Attitude/Demeanor/Rapport:    Affect (typically observed):    Orientation:  Oriented to Self, Oriented to Place, Oriented to  Time, Oriented to Situation Alcohol / Substance use:  Not Applicable Psych involvement (Current and /or in the community):  No (Comment)  Discharge Needs  Concerns to be addressed:  No discharge needs identified Readmission within the last 30 days:  Yes (10/21/15 [facility]) Current discharge risk:  Lack of support system Barriers to Discharge:  No Barriers Identified   Darleene Cleaver, LCSWA 11/01/2015, 11:50 AM

## 2015-11-01 NOTE — Clinical Social Work Note (Signed)
Patient transferred to 5W07 CSW gave report to unit CSW, this CSW to sign off.  Ervin Knack. Deadrian Toya, MSW, LCSWA [phone] 11/01/2015 11:58 AM

## 2015-11-02 LAB — CBC
HEMATOCRIT: 27.4 % — AB (ref 39.0–52.0)
Hemoglobin: 9 g/dL — ABNORMAL LOW (ref 13.0–17.0)
MCH: 30.7 pg (ref 26.0–34.0)
MCHC: 32.8 g/dL (ref 30.0–36.0)
MCV: 93.5 fL (ref 78.0–100.0)
Platelets: 162 10*3/uL (ref 150–400)
RBC: 2.93 MIL/uL — ABNORMAL LOW (ref 4.22–5.81)
RDW: 13.5 % (ref 11.5–15.5)
WBC: 6.7 10*3/uL (ref 4.0–10.5)

## 2015-11-02 LAB — GLUCOSE, CAPILLARY
GLUCOSE-CAPILLARY: 406 mg/dL — AB (ref 65–99)
GLUCOSE-CAPILLARY: 343 mg/dL — AB (ref 65–99)
Glucose-Capillary: 129 mg/dL — ABNORMAL HIGH (ref 65–99)
Glucose-Capillary: 87 mg/dL (ref 65–99)
Glucose-Capillary: 300 mg/dL — ABNORMAL HIGH (ref 65–99)
Glucose-Capillary: 438 mg/dL — ABNORMAL HIGH (ref 65–99)

## 2015-11-02 LAB — BASIC METABOLIC PANEL
Anion gap: 12 (ref 5–15)
BUN: 24 mg/dL — AB (ref 6–20)
CHLORIDE: 105 mmol/L (ref 101–111)
CO2: 25 mmol/L (ref 22–32)
Calcium: 8.8 mg/dL — ABNORMAL LOW (ref 8.9–10.3)
Creatinine, Ser: 1.41 mg/dL — ABNORMAL HIGH (ref 0.61–1.24)
GFR calc Af Amer: 56 mL/min — ABNORMAL LOW (ref >60)
GFR calc non Af Amer: 48 mL/min — ABNORMAL LOW (ref >60)
GLUCOSE: 86 mg/dL (ref 65–99)
POTASSIUM: 4.2 mmol/L (ref 3.5–5.1)
Sodium: 142 mmol/L (ref 135–145)

## 2015-11-02 MED ORDER — LEVETIRACETAM 750 MG PO TABS
750.0000 mg | ORAL_TABLET | Freq: Two times a day (BID) | ORAL | Status: DC
  Administered 2015-11-02 – 2015-11-04 (×4): 750 mg via ORAL
  Filled 2015-11-02 (×4): qty 1

## 2015-11-02 MED ORDER — VANCOMYCIN HCL IN DEXTROSE 750-5 MG/150ML-% IV SOLN
750.0000 mg | INTRAVENOUS | Status: DC
  Administered 2015-11-02: 750 mg via INTRAVENOUS
  Filled 2015-11-02 (×2): qty 150

## 2015-11-02 MED ORDER — HYDRALAZINE HCL 20 MG/ML IJ SOLN
5.0000 mg | Freq: Four times a day (QID) | INTRAMUSCULAR | Status: DC | PRN
  Administered 2015-11-02: 5 mg via INTRAVENOUS
  Filled 2015-11-02: qty 1

## 2015-11-02 NOTE — Progress Notes (Signed)
Notified Dr Keenan Bachelor about pt BS being 438, orders to give sliding 5 units insulin, and BP of 177/76, orders received hydralazine 5mg  iv

## 2015-11-02 NOTE — Progress Notes (Signed)
Pharmacy Antibiotic Note  Benjamin Hodge is a 75 y.o. male admitted on 10/28/2015 with weakness and LOC. The patient was started on Vancomycin + Cefepime for r/o aspiration/PNA and HCAP narrowed to Levaquin this morning. Blood cultures updated as 1/2 GPC in clusters and pharmacy has been reconsulted to resume Vancomycin while ruling out pathogen vs contaminant.  Blood cultures are still pending speciation this morning however it is noted that the SCr has bumped to 1.41 << 1.06, CrCl~35-40 ml/min. Will adjust the Vancomycin dose for worsening renal function   Plan: 1. Adjust Vancomycin to 750 mg IV every 24 hours 2. Will continue to follow renal function, culture results, LOT, and antibiotic de-escalation plans   Height: 5\' 6"  (167.6 cm) Weight: 130 lb 11.7 oz (59.3 kg) IBW/kg (Calculated) : 63.8  Temp (24hrs), Avg:98.2 F (36.8 C), Min:98 F (36.7 C), Max:98.4 F (36.9 C)   Recent Labs Lab 10/28/15 1440 10/28/15 1846 10/28/15 2013 10/29/15 0721  10/29/15 1456 10/29/15 2335 10/30/15 0406 10/31/15 0445 11/02/15 0607  WBC 8.9  --   --  7.9  --   --   --   --  4.4 6.7  CREATININE 2.36*  --   --  2.69*  2.74*  < > 1.94* 1.55* 1.42* 1.06 1.41*  LATICACIDVEN  --  0.9 1.1 1.6  --   --   --   --   --   --   < > = values in this interval not displayed.  Estimated Creatinine Clearance: 39.7 mL/min (by C-G formula based on Cr of 1.41).    Allergies  Allergen Reactions  . Ace Inhibitors Other (See Comments)    dizzy  . Cymbalta [Duloxetine Hcl] Other (See Comments)    dizzy  . Lipitor [Atorvastatin] Other (See Comments)    Caused pain all over body.  . Bee Venom Swelling  . Glucerna [Nutritional Supplements] Diarrhea  . Angiotensin Receptor Blockers Other (See Comments)    unknown    Antimicrobials this admission: Zosyn 2/20 x 1 Vanc 2/20 >>  Cefepime 2/20 >> 2/24 LVQ 2/24 >>  Dose adjustments this admission: n/a  Microbiology results: 2/21 MRSA PCR >> positive   2/21 CDiff >> negative 2/21 GI panel >> negative 2/21 UCx >> NG 2/21 BCx >> 1/2 GPC in clusters  Thank you for allowing pharmacy to be a part of this patient's care.  Georgina Pillion, PharmD, BCPS Clinical Pharmacist Pager: [phone] 11/02/2015 10:38 AM

## 2015-11-02 NOTE — Progress Notes (Signed)
[facility] PROGRESS NOTE  FAVOR HACKLER ZOX:[id number] DOB: [birth year] DOA: 10/28/2015 PCP: Kimber Relic, MD  Assessment/Plan: 1. HCAP/Aspiration pneumonia -SLP eval completed last admission, was treated with levaquin then -back on broad spectrum Abx since admission, completed 3days of Vanc and cefepime,  -Blood Cx-NGTD x2 day -MBS completed 2/23: mild dysphagia noted, Regular diet recommended and swallowing strategies recommended -Incentive spirometry -changed to PO levaquin 2/24  2. Chronic Diarrhea -immodium PRN  3. Mild DKA/uncontrolled DM -off insulin gtt,  -continue Lantus, meal coverage and customized SSI, low CBG last pm, continue lantus @ 10units -Carb mod Diet -D/w DM coordinator, sees Dr.Kerr, difficulty managing Brittle DM at ALF  4. Seizures -continue Keppra  5. R Renal mass -needs Urology FU, seen by Dr.Grapey 3 weeks ago -interval increase in size  6. Metabolic encephalopathy -due to infection/DKA/Dementia -back to baseline now  7. Dementia -now stable  8. Abd pain/Diarrhea -has long standing diarrhea for >15years, takes immodium -Cdiff negative -per Pt workup negative, was told may have IBS -CT with mild thickening of terminal ileum/cecum, likely gastroenteritis-GI pathogen panel negative, Cdiff negative -resolved, tolerating regular diet -h/o extensive GI workup which was unrevealing  9. AKI on CKD3 -resolved with hydration  10. Elevated Troponin -no chest pain, due to above infections/DKA -repeat troponin improved, 0.08, non ACS pattern  DVT proph: lovenox  Code Status: Full Code Family Communication: none at bedside, left msg for son [name] Disposition Plan: SNF pending availability of a facility that can manage his DM, 5-6 admissions in 2 months for same   HPI/Subjective: No events, diarrhea better, breathing ok  Objective: Filed Vitals:   11/02/15 0634 11/02/15 1100  BP: 159/58 160/56  Pulse: 72 70  Temp: 98 F (36.7  C) 98.2 F (36.8 C)  Resp: 14 16    Intake/Output Summary (Last 24 hours) at 11/02/15 1140 Last data filed at 11/02/15 0636  Gross per 24 hour  Intake    440 ml  Output   1450 ml  Net  -1010 ml   Filed Weights   10/31/15 0331 10/31/15 1848 11/02/15 0634  Weight: 58.2 kg (128 lb 4.9 oz) 59.6 kg (131 lb 6.3 oz) 59.3 kg (130 lb 11.7 oz)    Exam:   General:  AAOx3, no distress  Cardiovascular: S1S2/RRR  Respiratory: CTAB  Abdomen: soft, NT, BS present  Musculoskeletal: no edema c/c  Neuro: non focal  Data Reviewed: Basic Metabolic Panel:  Recent Labs Lab 10/29/15 1456 10/29/15 2335 10/30/15 0406 10/31/15 0445 11/02/15 0607  NA 140 139 140 142 142  K 3.0* 3.7 4.1 3.8 4.2  CL 107 109 111 108 105  CO2 20* 18* 18* 22 25  GLUCOSE 137* 135* 125* 244* 86  BUN 46* 37* 33* 19 24*  CREATININE 1.94* 1.55* 1.42* 1.06 1.41*  CALCIUM 8.7* 8.6* 8.7* 8.9 8.8*   Liver Function Tests:  Recent Labs Lab 10/28/15 1440 10/29/15 0721  AST 25 19  ALT 22 20  ALKPHOS 87 82  BILITOT 0.3 1.2  PROT 6.9 5.6*  ALBUMIN 3.5 3.0*    Recent Labs Lab 10/28/15 1440  LIPASE 33   No results for input(s): AMMONIA in the last 168 hours. CBC:  Recent Labs Lab 10/28/15 1440 10/29/15 0721 10/31/15 0445 11/02/15 0607  WBC 8.9 7.9 4.4 6.7  NEUTROABS 6.0  --   --   --   HGB 9.2* 9.3* 9.5* 9.0*  HCT 26.7* 29.1* 29.0* 27.4*  MCV 93.0 95.1 92.1 93.5  PLT  245 254 194 162   Cardiac Enzymes:  Recent Labs Lab 10/29/15 0721 10/29/15 1155 10/29/15 1944 10/30/15 1655  TROPONINI <0.03 0.06* 0.12* 0.08*   BNP (last 3 results)  Recent Labs  10/20/15 1043  BNP 280.3*    ProBNP (last 3 results) No results for input(s): PROBNP in the last 8760 hours.  CBG:  Recent Labs Lab 11/01/15 1214 11/01/15 1718 11/01/15 2147 11/02/15 0253 11/02/15 0818  GLUCAP 262* 87 234* 129* 87    Recent Results (from the past 240 hour(s))  Urine culture     Status: None   Collection  Time: 10/28/15  6:07 PM  Result Value Ref Range Status   Specimen Description URINE, CATHETERIZED  Final   Special Requests NONE  Final   Culture NO GROWTH 1 DAY  Final   Report Status 10/29/2015 FINAL  Final  Culture, blood (routine x 2)     Status: None (Preliminary result)   Collection Time: 10/29/15 12:55 AM  Result Value Ref Range Status   Specimen Description BLOOD RIGHT HAND  Final   Special Requests BOTTLES DRAWN AEROBIC ONLY 5CC  Final   Culture NO GROWTH 4 DAYS  Final   Report Status PENDING  Incomplete  Culture, blood (routine x 2)     Status: None (Preliminary result)   Collection Time: 10/29/15  1:03 AM  Result Value Ref Range Status   Specimen Description BLOOD RIGHT ARM  Final   Special Requests IN PEDIATRIC BOTTLE 2CC  Final   Culture  Setup Time   Final    GRAM POSITIVE COCCI IN CLUSTERS AEROBIC BOTTLE ONLY CRITICAL RESULT CALLED TO, READ BACK BY AND VERIFIED WITH: [facility] [employer] 11/01/15 @ 1006 M VESTAL    Culture   Final    GRAM POSITIVE COCCI CULTURE REINCUBATED FOR BETTER GROWTH    Report Status PENDING  Incomplete  MRSA PCR Screening     Status: Abnormal   Collection Time: 10/29/15  5:32 AM  Result Value Ref Range Status   MRSA by PCR POSITIVE (A) NEGATIVE Final    Comment:        The GeneXpert MRSA Assay (FDA approved for NASAL specimens only), is one component of a comprehensive MRSA colonization surveillance program. It is not intended to diagnose MRSA infection nor to guide or monitor treatment for MRSA infections. RESULT CALLED TO, READ BACK BY AND VERIFIED WITH: S. MOORE RN 10/29/15 AT 0815 BY A. DAVIS   C difficile quick scan w PCR reflex     Status: None   Collection Time: 10/29/15  7:30 AM  Result Value Ref Range Status   C Diff antigen NEGATIVE NEGATIVE Final   C Diff toxin NEGATIVE NEGATIVE Final   C Diff interpretation Negative for toxigenic C. difficile  Final  Gastrointestinal Panel by PCR , Stool     Status: None   Collection Time:  10/29/15  7:30 AM  Result Value Ref Range Status   Campylobacter species NOT DETECTED NOT DETECTED Final   Plesimonas shigelloides NOT DETECTED NOT DETECTED Final   Salmonella species NOT DETECTED NOT DETECTED Final   Yersinia enterocolitica NOT DETECTED NOT DETECTED Final   Vibrio species NOT DETECTED NOT DETECTED Final   Vibrio cholerae NOT DETECTED NOT DETECTED Final   Enteroaggregative E coli (EAEC) NOT DETECTED NOT DETECTED Final   Enteropathogenic E coli (EPEC) NOT DETECTED NOT DETECTED Final   Enterotoxigenic E coli (ETEC) NOT DETECTED NOT DETECTED Final   Shiga like toxin producing E coli (STEC)  NOT DETECTED NOT DETECTED Final   E. coli O157 NOT DETECTED NOT DETECTED Final   Shigella/Enteroinvasive E coli (EIEC) NOT DETECTED NOT DETECTED Final   Cryptosporidium NOT DETECTED NOT DETECTED Final   Cyclospora cayetanensis NOT DETECTED NOT DETECTED Final   Entamoeba histolytica NOT DETECTED NOT DETECTED Final   Giardia lamblia NOT DETECTED NOT DETECTED Final   Adenovirus F40/41 NOT DETECTED NOT DETECTED Final   Astrovirus NOT DETECTED NOT DETECTED Final   Norovirus GI/GII NOT DETECTED NOT DETECTED Final   Rotavirus A NOT DETECTED NOT DETECTED Final   Sapovirus (I, II, IV, and V) NOT DETECTED NOT DETECTED Final     Studies: No results found.  Scheduled Meds: . budesonide-formoterol  2 puff Inhalation BID  . clopidogrel  75 mg Oral Q breakfast  . escitalopram  20 mg Oral Daily  . heparin  5,000 Units Subcutaneous 3 times per day  . insulin aspart  0-5 Units Subcutaneous TID WC  . insulin aspart  0-5 Units Subcutaneous QHS  . insulin aspart  3 Units Subcutaneous TID WC  . insulin glargine  10 Units Subcutaneous Daily  . levETIRAcetam  1,000 mg Oral BID  . levofloxacin  500 mg Oral Daily  . levothyroxine  75 mcg Oral QAC breakfast  . loperamide  2 mg Oral BID  . mupirocin ointment  1 application Nasal BID  . pravastatin  20 mg Oral q1800  . pregabalin  100 mg Oral QHS  .  pregabalin  50 mg Oral Daily  . saccharomyces boulardii  250 mg Oral QPM  . tiotropium  18 mcg Inhalation Daily  . vancomycin  750 mg Intravenous Q24H  . vitamin B-12  1,000 mcg Oral Daily   Continuous Infusions:   Antibiotics Given (last 72 hours)    Date/Time Action Medication Dose Rate   10/30/15 1709 Given   vancomycin (VANCOCIN) IVPB 750 mg/150 ml premix 750 mg 150 mL/hr   10/31/15 9021 Given   ceFEPIme (MAXIPIME) 1 g in dextrose 5 % 50 mL IVPB 1 g 100 mL/hr   10/31/15 1725 Given   vancomycin (VANCOCIN) IVPB 750 mg/150 ml premix 750 mg 150 mL/hr   11/01/15 0019 Given   ceFEPIme (MAXIPIME) 1 g in dextrose 5 % 50 mL IVPB 1 g 100 mL/hr   11/01/15 0902 Given   ceFEPIme (MAXIPIME) 1 g in dextrose 5 % 50 mL IVPB 1 g 100 mL/hr   11/01/15 1215 Given   levofloxacin (LEVAQUIN) tablet 500 mg 500 mg    11/01/15 1228 Given   vancomycin (VANCOCIN) 500 mg in sodium chloride 0.9 % 100 mL IVPB 500 mg 100 mL/hr   11/02/15 0010 Given   vancomycin (VANCOCIN) 500 mg in sodium chloride 0.9 % 100 mL IVPB 500 mg 100 mL/hr   11/02/15 1059 Given   levofloxacin (LEVAQUIN) tablet 500 mg 500 mg       Principal Problem:   AKI (acute kidney injury) (HCC) Active Problems:   Type 1 diabetes mellitus with neurological manifestations, uncontrolled (HCC)   Essential hypertension   COPD (chronic obstructive pulmonary disease) (HCC)   CAD- moderate3V disease at cath 7/15- medical Rx   Seizures (HCC)   Dehydration   Diarrhea   Acute encephalopathy   Hypotension   Slurred speech    Time spent:    [facility]  [facility] Pager [phone]. If 7PM-7AM, please contact night-coverage at [email or web address], password [facility] 11/02/2015, 11:40 AM  LOS: 4 days

## 2015-11-03 LAB — BASIC METABOLIC PANEL
Anion gap: 11 (ref 5–15)
BUN: 25 mg/dL — ABNORMAL HIGH (ref 6–20)
CALCIUM: 9.2 mg/dL (ref 8.9–10.3)
CO2: 28 mmol/L (ref 22–32)
CREATININE: 1.41 mg/dL — AB (ref 0.61–1.24)
Chloride: 103 mmol/L (ref 101–111)
GFR calc non Af Amer: 48 mL/min — ABNORMAL LOW (ref >60)
GFR, EST AFRICAN AMERICAN: 56 mL/min — AB (ref >60)
Glucose, Bld: 243 mg/dL — ABNORMAL HIGH (ref 65–99)
Potassium: 4.9 mmol/L (ref 3.5–5.1)
SODIUM: 142 mmol/L (ref 135–145)

## 2015-11-03 LAB — CULTURE, BLOOD (ROUTINE X 2): CULTURE: NO GROWTH

## 2015-11-03 LAB — CBC
HEMATOCRIT: 27.8 % — AB (ref 39.0–52.0)
Hemoglobin: 8.9 g/dL — ABNORMAL LOW (ref 13.0–17.0)
MCH: 30.1 pg (ref 26.0–34.0)
MCHC: 32 g/dL (ref 30.0–36.0)
MCV: 93.9 fL (ref 78.0–100.0)
Platelets: 171 10*3/uL (ref 150–400)
RBC: 2.96 MIL/uL — ABNORMAL LOW (ref 4.22–5.81)
RDW: 13.4 % (ref 11.5–15.5)
WBC: 6.7 10*3/uL (ref 4.0–10.5)

## 2015-11-03 LAB — GLUCOSE, CAPILLARY
GLUCOSE-CAPILLARY: 240 mg/dL — AB (ref 65–99)
GLUCOSE-CAPILLARY: 272 mg/dL — AB (ref 65–99)
Glucose-Capillary: 278 mg/dL — ABNORMAL HIGH (ref 65–99)
Glucose-Capillary: 324 mg/dL — ABNORMAL HIGH (ref 65–99)
Glucose-Capillary: 338 mg/dL — ABNORMAL HIGH (ref 65–99)
Glucose-Capillary: 179 mg/dL — ABNORMAL HIGH (ref 65–99)

## 2015-11-03 MED ORDER — INSULIN GLARGINE 100 UNIT/ML ~~LOC~~ SOLN
12.0000 [IU] | Freq: Every day | SUBCUTANEOUS | Status: DC
  Administered 2015-11-03 – 2015-11-04 (×2): 12 [IU] via SUBCUTANEOUS
  Filled 2015-11-03 (×2): qty 0.12

## 2015-11-03 MED ORDER — LEVOFLOXACIN 500 MG PO TABS
250.0000 mg | ORAL_TABLET | Freq: Every day | ORAL | Status: DC
  Administered 2015-11-04: 250 mg via ORAL
  Filled 2015-11-03: qty 1

## 2015-11-03 MED ORDER — INSULIN ASPART 100 UNIT/ML ~~LOC~~ SOLN
4.0000 [IU] | Freq: Three times a day (TID) | SUBCUTANEOUS | Status: DC
  Administered 2015-11-03 – 2015-11-04 (×3): 4 [IU] via SUBCUTANEOUS

## 2015-11-03 NOTE — Progress Notes (Signed)
[facility] PROGRESS NOTE  Benjamin Hodge XTG:[id number] DOB: December 21, 1940 DOA: 10/28/2015 PCP: Kimber Relic, MD  Assessment/Plan: 1. HCAP/Aspiration pneumonia -SLP eval completed last admission, was treated with levaquin then -back on broad spectrum Abx since admission, completed 3days of Vanc and cefepime,  -Blood Cx-NGTD x4 days -MBS completed 2/23: mild dysphagia noted, Regular diet recommended and swallowing strategies recommended -Incentive spirometry -changed to PO levaquin 2/24 -improved and stable  2. Chronic Diarrhea -improved -immodium PRN  3. Mild DKA/uncontrolled DM -off insulin gtt,  -continue Lantus, meal coverage and customized SSI,  -eating large amounts per RN, ate breakfast twice yesterday am -Carb mod Diet -D/w DM coordinator, sees Dr.Kerr, difficulty managing [facility] DM at ALF  4. Seizures -continue Keppra  5. R Renal mass -needs Urology FU, seen by Dr.Grapey 3 weeks ago -interval increase in size  6. Metabolic encephalopathy -due to infection/DKA/Dementia -back to baseline now  7. Dementia -now stable  8. Abd pain/Diarrhea -has long standing diarrhea for >15years, takes immodium -Cdiff negative -per Pt workup negative, was told may have IBS -CT with mild thickening of terminal ileum/cecum, likely gastroenteritis-GI pathogen panel negative, Cdiff negative -resolved, tolerating regular diet -h/o extensive GI workup which was unrevealing  9. AKI on CKD3 -resolved with hydration  10. Elevated Troponin -no chest pain, due to above infections/DKA -repeat troponin improved, 0.08, non ACS pattern  DVT proph: lovenox  Code Status: Full Code Family Communication: none at bedside, left msg for son [name] Disposition Plan: SNF pending availability of a facility that can manage his DM, 5-6 admissions in 2 months for same   HPI/Subjective: No events, diarrhea better, breathing ok  Objective: Filed Vitals:   11/03/15 0544 11/03/15 0957   BP: 159/78 123/67  Pulse: 69 93  Temp: 98.1 F (36.7 C) 98.3 F (36.8 C)  Resp: 18 17    Intake/Output Summary (Last 24 hours) at 11/03/15 1330 Last data filed at 11/03/15 1024  Gross per 24 hour  Intake      0 ml  Output   2900 ml  Net  -2900 ml   Filed Weights   10/31/15 0331 10/31/15 1848 11/02/15 0634  Weight: 58.2 kg (128 lb 4.9 oz) 59.6 kg (131 lb 6.3 oz) 59.3 kg (130 lb 11.7 oz)    Exam:   General:  AAOx3, no distress  Cardiovascular: S1S2/RRR  Respiratory: CTAB  Abdomen: soft, NT, BS present  Musculoskeletal: no edema c/c  Neuro: non focal  Data Reviewed: Basic Metabolic Panel:  Recent Labs Lab 10/29/15 2335 10/30/15 0406 10/31/15 0445 11/02/15 0607 11/03/15 0640  NA 139 140 142 142 142  K 3.7 4.1 3.8 4.2 4.9  CL 109 111 108 105 103  CO2 18* 18* 22 25 28   GLUCOSE 135* 125* 244* 86 243*  BUN 37* 33* 19 24* 25*  CREATININE 1.55* 1.42* 1.06 1.41* 1.41*  CALCIUM 8.6* 8.7* 8.9 8.8* 9.2   Liver Function Tests:  Recent Labs Lab 10/28/15 1440 10/29/15 0721  AST 25 19  ALT 22 20  ALKPHOS 87 82  BILITOT 0.3 1.2  PROT 6.9 5.6*  ALBUMIN 3.5 3.0*    Recent Labs Lab 10/28/15 1440  LIPASE 33   No results for input(s): AMMONIA in the last 168 hours. CBC:  Recent Labs Lab 10/28/15 1440 10/29/15 0721 10/31/15 0445 11/02/15 0607 11/03/15 0640  WBC 8.9 7.9 4.4 6.7 6.7  NEUTROABS 6.0  --   --   --   --   HGB 9.2* 9.3* 9.5*  9.0* 8.9*  HCT 26.7* 29.1* 29.0* 27.4* 27.8*  MCV 93.0 95.1 92.1 93.5 93.9  PLT 245 254 194 162 171   Cardiac Enzymes:  Recent Labs Lab 10/29/15 0721 10/29/15 1155 10/29/15 1944 10/30/15 1655  TROPONINI <0.03 0.06* 0.12* 0.08*   BNP (last 3 results)  Recent Labs  10/20/15 1043  BNP 280.3*    ProBNP (last 3 results) No results for input(s): PROBNP in the last 8760 hours.  CBG:  Recent Labs Lab 11/02/15 2316 11/03/15 0309 11/03/15 0345 11/03/15 0758 11/03/15 1242  GLUCAP 343* 278* 240* 324*  338*    Recent Results (from the past 240 hour(s))  Urine culture     Status: None   Collection Time: 10/28/15  6:07 PM  Result Value Ref Range Status   Specimen Description URINE, CATHETERIZED  Final   Special Requests NONE  Final   Culture NO GROWTH 1 DAY  Final   Report Status 10/29/2015 FINAL  Final  Culture, blood (routine x 2)     Status: None   Collection Time: 10/29/15 12:55 AM  Result Value Ref Range Status   Specimen Description BLOOD RIGHT HAND  Final   Special Requests BOTTLES DRAWN AEROBIC ONLY 5CC  Final   Culture NO GROWTH 5 DAYS  Final   Report Status 11/03/2015 FINAL  Final  Culture, blood (routine x 2)     Status: None   Collection Time: 10/29/15  1:03 AM  Result Value Ref Range Status   Specimen Description BLOOD RIGHT ARM  Final   Special Requests IN PEDIATRIC BOTTLE 2CC  Final   Culture  Setup Time   Final    GRAM POSITIVE COCCI IN CLUSTERS AEROBIC BOTTLE ONLY CRITICAL RESULT CALLED TO, READ BACK BY AND VERIFIED WITH: A [facility] 11/01/15 @ 1006 M VESTAL    Culture   Final    STAPHYLOCOCCUS SPECIES (COAGULASE NEGATIVE) THE SIGNIFICANCE OF ISOLATING THIS ORGANISM FROM A SINGLE SET OF BLOOD CULTURES WHEN MULTIPLE SETS ARE DRAWN IS UNCERTAIN. PLEASE NOTIFY THE MICROBIOLOGY DEPARTMENT WITHIN ONE WEEK IF SPECIATION AND SENSITIVITIES ARE REQUIRED.    Report Status 11/03/2015 FINAL  Final  MRSA PCR Screening     Status: Abnormal   Collection Time: 10/29/15  5:32 AM  Result Value Ref Range Status   MRSA by PCR POSITIVE (A) NEGATIVE Final    Comment:        The GeneXpert MRSA Assay (FDA approved for NASAL specimens only), is one component of a comprehensive MRSA colonization surveillance program. It is not intended to diagnose MRSA infection nor to guide or monitor treatment for MRSA infections. RESULT CALLED TO, READ BACK BY AND VERIFIED WITH: S. MOORE RN 10/29/15 AT 0815 BY A. DAVIS   C difficile quick scan w PCR reflex     Status: None   Collection Time:  10/29/15  7:30 AM  Result Value Ref Range Status   C Diff antigen NEGATIVE NEGATIVE Final   C Diff toxin NEGATIVE NEGATIVE Final   C Diff interpretation Negative for toxigenic C. difficile  Final  Gastrointestinal Panel by PCR , Stool     Status: None   Collection Time: 10/29/15  7:30 AM  Result Value Ref Range Status   Campylobacter species NOT DETECTED NOT DETECTED Final   Plesimonas shigelloides NOT DETECTED NOT DETECTED Final   Salmonella species NOT DETECTED NOT DETECTED Final   Yersinia enterocolitica NOT DETECTED NOT DETECTED Final   Vibrio species NOT DETECTED NOT DETECTED Final   Vibrio cholerae  NOT DETECTED NOT DETECTED Final   Enteroaggregative E coli (EAEC) NOT DETECTED NOT DETECTED Final   Enteropathogenic E coli (EPEC) NOT DETECTED NOT DETECTED Final   Enterotoxigenic E coli (ETEC) NOT DETECTED NOT DETECTED Final   Shiga like toxin producing E coli (STEC) NOT DETECTED NOT DETECTED Final   E. coli O157 NOT DETECTED NOT DETECTED Final   Shigella/Enteroinvasive E coli (EIEC) NOT DETECTED NOT DETECTED Final   Cryptosporidium NOT DETECTED NOT DETECTED Final   Cyclospora cayetanensis NOT DETECTED NOT DETECTED Final   Entamoeba histolytica NOT DETECTED NOT DETECTED Final   Giardia lamblia NOT DETECTED NOT DETECTED Final   Adenovirus F40/41 NOT DETECTED NOT DETECTED Final   Astrovirus NOT DETECTED NOT DETECTED Final   Norovirus GI/GII NOT DETECTED NOT DETECTED Final   Rotavirus A NOT DETECTED NOT DETECTED Final   Sapovirus (I, II, IV, and V) NOT DETECTED NOT DETECTED Final     Studies: No results found.  Scheduled Meds: . budesonide-formoterol  2 puff Inhalation BID  . clopidogrel  75 mg Oral Q breakfast  . escitalopram  20 mg Oral Daily  . heparin  5,000 Units Subcutaneous 3 times per day  . insulin aspart  0-5 Units Subcutaneous TID WC  . insulin aspart  0-5 Units Subcutaneous QHS  . insulin aspart  4 Units Subcutaneous TID WC  . insulin glargine  12 Units  Subcutaneous Daily  . levETIRAcetam  750 mg Oral BID  . [START ON 11/04/2015] levofloxacin  250 mg Oral Daily  . levothyroxine  75 mcg Oral QAC breakfast  . loperamide  2 mg Oral BID  . pravastatin  20 mg Oral q1800  . pregabalin  100 mg Oral QHS  . pregabalin  50 mg Oral Daily  . saccharomyces boulardii  250 mg Oral QPM  . tiotropium  18 mcg Inhalation Daily  . vitamin B-12  1,000 mcg Oral Daily   Continuous Infusions:   Antibiotics Given (last 72 hours)    Date/Time Action Medication Dose Rate   10/31/15 1725 Given   vancomycin (VANCOCIN) IVPB 750 mg/150 ml premix 750 mg 150 mL/hr   11/01/15 0019 Given   ceFEPIme (MAXIPIME) 1 g in dextrose 5 % 50 mL IVPB 1 g 100 mL/hr   11/01/15 0902 Given   ceFEPIme (MAXIPIME) 1 g in dextrose 5 % 50 mL IVPB 1 g 100 mL/hr   11/01/15 1215 Given   levofloxacin (LEVAQUIN) tablet 500 mg 500 mg    11/01/15 1228 Given   vancomycin (VANCOCIN) 500 mg in sodium chloride 0.9 % 100 mL IVPB 500 mg 100 mL/hr   11/02/15 0010 Given   vancomycin (VANCOCIN) 500 mg in sodium chloride 0.9 % 100 mL IVPB 500 mg 100 mL/hr   11/02/15 1059 Given   levofloxacin (LEVAQUIN) tablet 500 mg 500 mg    11/02/15 1716 Given   vancomycin (VANCOCIN) IVPB 750 mg/150 ml premix 750 mg 150 mL/hr   11/03/15 0955 Given   levofloxacin (LEVAQUIN) tablet 500 mg 500 mg       Principal Problem:   AKI (acute kidney injury) (HCC) Active Problems:   Type 1 diabetes mellitus with neurological manifestations, uncontrolled (HCC)   Essential hypertension   COPD (chronic obstructive pulmonary disease) (HCC)   CAD- moderate3V disease at cath 7/15- medical Rx   Seizures (HCC)   Dehydration   Diarrhea   Acute encephalopathy   Hypotension   Slurred speech    Time spent:    Ayah Cozzolino  [facility]  Pager [phone]. If 7PM-7AM, please contact night-coverage at [email or web address], password [facility] 11/03/2015, 1:30 PM  LOS: 5 days

## 2015-11-04 DIAGNOSIS — I2510 Atherosclerotic heart disease of native coronary artery without angina pectoris: Secondary | ICD-10-CM | POA: Diagnosis not present

## 2015-11-04 DIAGNOSIS — I129 Hypertensive chronic kidney disease with stage 1 through stage 4 chronic kidney disease, or unspecified chronic kidney disease: Secondary | ICD-10-CM | POA: Diagnosis not present

## 2015-11-04 DIAGNOSIS — Z515 Encounter for palliative care: Secondary | ICD-10-CM | POA: Diagnosis not present

## 2015-11-04 DIAGNOSIS — Z87891 Personal history of nicotine dependence: Secondary | ICD-10-CM | POA: Diagnosis not present

## 2015-11-04 DIAGNOSIS — A419 Sepsis, unspecified organism: Secondary | ICD-10-CM | POA: Diagnosis not present

## 2015-11-04 DIAGNOSIS — R069 Unspecified abnormalities of breathing: Secondary | ICD-10-CM | POA: Diagnosis not present

## 2015-11-04 DIAGNOSIS — N2889 Other specified disorders of kidney and ureter: Secondary | ICD-10-CM | POA: Diagnosis not present

## 2015-11-04 DIAGNOSIS — E113299 Type 2 diabetes mellitus with mild nonproliferative diabetic retinopathy without macular edema, unspecified eye: Secondary | ICD-10-CM | POA: Diagnosis not present

## 2015-11-04 DIAGNOSIS — E1310 Other specified diabetes mellitus with ketoacidosis without coma: Secondary | ICD-10-CM | POA: Diagnosis not present

## 2015-11-04 DIAGNOSIS — E039 Hypothyroidism, unspecified: Secondary | ICD-10-CM | POA: Diagnosis not present

## 2015-11-04 DIAGNOSIS — R1319 Other dysphagia: Secondary | ICD-10-CM | POA: Diagnosis not present

## 2015-11-04 DIAGNOSIS — E860 Dehydration: Secondary | ICD-10-CM | POA: Diagnosis not present

## 2015-11-04 DIAGNOSIS — J690 Pneumonitis due to inhalation of food and vomit: Secondary | ICD-10-CM | POA: Diagnosis not present

## 2015-11-04 DIAGNOSIS — E038 Other specified hypothyroidism: Secondary | ICD-10-CM | POA: Diagnosis not present

## 2015-11-04 DIAGNOSIS — E538 Deficiency of other specified B group vitamins: Secondary | ICD-10-CM | POA: Diagnosis not present

## 2015-11-04 DIAGNOSIS — Z9119 Patient's noncompliance with other medical treatment and regimen: Secondary | ICD-10-CM | POA: Diagnosis not present

## 2015-11-04 DIAGNOSIS — J9601 Acute respiratory failure with hypoxia: Secondary | ICD-10-CM | POA: Diagnosis not present

## 2015-11-04 DIAGNOSIS — E11649 Type 2 diabetes mellitus with hypoglycemia without coma: Secondary | ICD-10-CM | POA: Diagnosis not present

## 2015-11-04 DIAGNOSIS — L299 Pruritus, unspecified: Secondary | ICD-10-CM | POA: Diagnosis not present

## 2015-11-04 DIAGNOSIS — R197 Diarrhea, unspecified: Secondary | ICD-10-CM | POA: Diagnosis not present

## 2015-11-04 DIAGNOSIS — R69 Illness, unspecified: Secondary | ICD-10-CM | POA: Diagnosis not present

## 2015-11-04 DIAGNOSIS — I959 Hypotension, unspecified: Secondary | ICD-10-CM | POA: Diagnosis not present

## 2015-11-04 DIAGNOSIS — R402421 Glasgow coma scale score 9-12, in the field [EMT or ambulance]: Secondary | ICD-10-CM | POA: Diagnosis not present

## 2015-11-04 DIAGNOSIS — Y95 Nosocomial condition: Secondary | ICD-10-CM | POA: Diagnosis present

## 2015-11-04 DIAGNOSIS — E1149 Type 2 diabetes mellitus with other diabetic neurological complication: Secondary | ICD-10-CM | POA: Diagnosis not present

## 2015-11-04 DIAGNOSIS — J189 Pneumonia, unspecified organism: Secondary | ICD-10-CM | POA: Diagnosis not present

## 2015-11-04 DIAGNOSIS — I25119 Atherosclerotic heart disease of native coronary artery with unspecified angina pectoris: Secondary | ICD-10-CM | POA: Diagnosis not present

## 2015-11-04 DIAGNOSIS — N183 Chronic kidney disease, stage 3 (moderate): Secondary | ICD-10-CM | POA: Diagnosis not present

## 2015-11-04 DIAGNOSIS — F0390 Unspecified dementia without behavioral disturbance: Secondary | ICD-10-CM | POA: Diagnosis present

## 2015-11-04 DIAGNOSIS — G40909 Epilepsy, unspecified, not intractable, without status epilepticus: Secondary | ICD-10-CM | POA: Diagnosis present

## 2015-11-04 DIAGNOSIS — E1065 Type 1 diabetes mellitus with hyperglycemia: Secondary | ICD-10-CM | POA: Diagnosis not present

## 2015-11-04 DIAGNOSIS — E1022 Type 1 diabetes mellitus with diabetic chronic kidney disease: Secondary | ICD-10-CM | POA: Diagnosis not present

## 2015-11-04 DIAGNOSIS — J441 Chronic obstructive pulmonary disease with (acute) exacerbation: Secondary | ICD-10-CM | POA: Diagnosis not present

## 2015-11-04 DIAGNOSIS — S37099D Other injury of unspecified kidney, subsequent encounter: Secondary | ICD-10-CM | POA: Diagnosis not present

## 2015-11-04 DIAGNOSIS — G9340 Encephalopathy, unspecified: Secondary | ICD-10-CM | POA: Diagnosis not present

## 2015-11-04 DIAGNOSIS — I10 Essential (primary) hypertension: Secondary | ICD-10-CM | POA: Diagnosis not present

## 2015-11-04 DIAGNOSIS — I739 Peripheral vascular disease, unspecified: Secondary | ICD-10-CM | POA: Diagnosis not present

## 2015-11-04 DIAGNOSIS — Z66 Do not resuscitate: Secondary | ICD-10-CM | POA: Diagnosis not present

## 2015-11-04 DIAGNOSIS — E1049 Type 1 diabetes mellitus with other diabetic neurological complication: Secondary | ICD-10-CM | POA: Diagnosis not present

## 2015-11-04 DIAGNOSIS — N179 Acute kidney failure, unspecified: Secondary | ICD-10-CM | POA: Diagnosis not present

## 2015-11-04 DIAGNOSIS — D638 Anemia in other chronic diseases classified elsewhere: Secondary | ICD-10-CM | POA: Diagnosis not present

## 2015-11-04 DIAGNOSIS — K580 Irritable bowel syndrome with diarrhea: Secondary | ICD-10-CM | POA: Diagnosis not present

## 2015-11-04 DIAGNOSIS — J188 Other pneumonia, unspecified organism: Secondary | ICD-10-CM | POA: Diagnosis not present

## 2015-11-04 DIAGNOSIS — J449 Chronic obstructive pulmonary disease, unspecified: Secondary | ICD-10-CM | POA: Diagnosis not present

## 2015-11-04 LAB — GLUCOSE, CAPILLARY
GLUCOSE-CAPILLARY: 98 mg/dL (ref 65–99)
Glucose-Capillary: 142 mg/dL — ABNORMAL HIGH (ref 65–99)
Glucose-Capillary: 101 mg/dL — ABNORMAL HIGH (ref 65–99)
Glucose-Capillary: 144 mg/dL — ABNORMAL HIGH (ref 65–99)

## 2015-11-04 MED ORDER — INSULIN ASPART 100 UNIT/ML ~~LOC~~ SOLN: 4.0000 [IU] | Freq: Three times a day (TID) | SUBCUTANEOUS | Status: AC

## 2015-11-04 MED ORDER — INSULIN GLARGINE 300 UNIT/ML ~~LOC~~ SOPN: 12.0000 [IU] | PEN_INJECTOR | Freq: Every morning | SUBCUTANEOUS | Status: DC

## 2015-11-04 MED ORDER — LOPERAMIDE HCL 2 MG PO CAPS: 2.0000 mg | ORAL_CAPSULE | Freq: Every morning | ORAL | Status: DC

## 2015-11-04 MED ORDER — INSULIN ASPART 100 UNIT/ML ~~LOC~~ SOLN: SUBCUTANEOUS | Status: DC

## 2015-11-04 MED ORDER — INSULIN ASPART 100 UNIT/ML ~~LOC~~ SOLN
3.0000 [IU] | Freq: Three times a day (TID) | SUBCUTANEOUS | Status: DC
  Administered 2015-11-04: 3 [IU] via SUBCUTANEOUS

## 2015-11-04 MED ORDER — LEVOFLOXACIN 250 MG PO TABS: 250.0000 mg | ORAL_TABLET | Freq: Every day | ORAL | Status: DC

## 2015-11-04 MED ORDER — INSULIN GLARGINE 100 UNIT/ML ~~LOC~~ SOLN: 12.0000 [IU] | Freq: Every day | SUBCUTANEOUS | Status: DC

## 2015-11-04 MED ORDER — TRAMADOL HCL 50 MG PO TABS: 50.0000 mg | ORAL_TABLET | Freq: Four times a day (QID) | ORAL | Status: DC | PRN

## 2015-11-04 NOTE — Discharge Summary (Addendum)
Physician Discharge Summary  Benjamin Hodge:[id number] DOB: May 04, 1941 DOA: 10/28/2015  PCP: Kimber Relic, MD  Admit date: 10/28/2015 Discharge date: 11/04/2015  Time spent: 45 minutes  Recommendations for Outpatient Follow-up:  1. PCP Dr.Green in 1 week 2. Endocrinology Dr.Jeffrey Sharl Ma in 1-2weeks 3. Please enforce/emphasize 3 meals a day, try not to allow double portions for breakfast, to better manage Brittle DM on current Insulin regimen 4. Renal mass concerning for malignancy, FU with Dr.Grapey in 32month   Discharge Diagnoses:  Principal Problem:   AKI (acute kidney injury) (HCC)   HCAP   Brittle uncontrolled DM   Type 1 diabetes mellitus with neurological manifestations, uncontrolled (HCC)   Essential hypertension   COPD (chronic obstructive pulmonary disease) (HCC)   CAD- moderate3V disease at cath 7/15- medical Rx   Seizures (HCC)   Dehydration   Diarrhea   Acute encephalopathy   Hypotension   Chronic diarrhea   Discharge Condition: stable  Diet recommendation: Diabetic  Filed Weights   10/31/15 1848 11/02/15 0634 11/04/15 0536  Weight: 59.6 kg (131 lb 6.3 oz) 59.3 kg (130 lb 11.7 oz) 58.5 kg (128 lb 15.5 oz)    History of present illness:  Chief Complaint: Weakness and loss of consciousness. HPI: Benjamin Hodge is a 75 y.o. male was admitted last week for pneumonia and uncontrolled diabetes mellitus had followed up with endocrinologist when patient was found very weak and had a near syncopal episode at the office. Patient was brought to the ER. Patient is found to be hypotensive and labs reveal acute renal failure. On exam patient also had mild abdominal tenderness and patient states he has been having diarrhea chronically. Denies any nausea vomiting chest pain or shortness of breath but has been having some productive cough and chest x-ray shows persistent infiltrates. Patient's blood sugars slowly increased while in the ER and was 500 by the time of  admission  Hospital Course:  1. HCAP/Aspiration pneumonia -SLP eval completed last admission, was treated with levaquin then -treated with broad spectrum Abx again since admission, completed 3days of Vanc and cefepime,  -Blood Cx-NGTD x4 days -MBS completed 2/23: mild dysphagia noted, Regular diet recommended and swallowing strategies recommended -Incentive spirometry -Antibiotics changed to PO levaquin 2/24 -improved and stable  2. Chronic Diarrhea -improved -immodium used PRN, reports h/o chronic diarrhea for 15years, workup unrevealing per pt  3. Mild DKA/uncontrolled DM on admission -treated with IVF, insulin gtt on admission -then transitioned to Lantus, SSI and meal coverage -after discussion with DM coordinator, used a customized SSI, unfortunately PO intake a little erratic, was eating breakfast twice in hospital on more than 1 occasion which caused his CBGs to spike. -had long discussions with pt on importance of 3 meals and moderation of PO intake -FU with Dr.Kerr after discharge  4. Seizures -continue Keppra  5. R Renal mass -needs Urology FU, seen by Dr.Grapey 3 weeks ago -interval increase in size  6. Metabolic encephalopathy -due to infection/DKA/Dementia -back to baseline now  7. Dementia -now stable  8. Abd pain/Diarrhea -has long standing diarrhea for >15years, takes immodium -Cdiff negative -per Pt workup negative, was told may have IBS -CT with mild thickening of terminal ileum/cecum, likely gastroenteritis-GI pathogen panel negative, Cdiff negative -resolved, tolerating regular diet -h/o extensive GI workup which was unrevealing  9. AKI on CKD3 -resolved with hydration  10. Elevated Troponin -no chest pain, due to above infections/DKA -repeat troponin improved, 0.08, non ACS pattern   Discharge Exam: Filed Vitals:  11/04/15 1057 11/04/15 1124  BP: 86/43 130/72  Pulse:    Temp:    Resp:      General: AAOx3, no  distress Cardiovascular: S1S2/RRR Respiratory: CTAB  Discharge Instructions   Discharge Instructions    Diet Carb Modified    Complete by:  As directed      Increase activity slowly    Complete by:  As directed           Current Discharge Medication List    START taking these medications   Details  levofloxacin (LEVAQUIN) 250 MG tablet Take 1 tablet (250 mg total) by mouth daily. For 1day Qty: 1 tablet, Refills: 0    loperamide (IMODIUM) 2 MG capsule Take 1 capsule (2 mg total) by mouth every morning. Refills: 0    traMADol (ULTRAM) 50 MG tablet Take 1 tablet (50 mg total) by mouth every 6 (six) hours as needed. Qty: 30 tablet, Refills: 0      CONTINUE these medications which have CHANGED   Details  !! insulin aspart (NOVOLOG) 100 UNIT/ML injection Inject 4 Units into the skin 3 (three) times daily with meals. Qty: 10 mL, Refills: 11    !! insulin aspart (NOVOLOG) 100 UNIT/ML injection 70-149: 0 units,  150-210 mg/dL- 1 unit, 409-811 mg/dL-2 units, 914-782 mg/dL-3 units, 956-213 mg/dL-4 units, 086-578 mg/dL-5 units and call MD Qty: 10 mL, Refills: 11    Insulin Glargine (TOUJEO SOLOSTAR) 300 UNIT/ML SOPN Inject 12 Units into the skin every morning.     !! - Potential duplicate medications found. Please discuss with provider.    CONTINUE these medications which have NOT CHANGED   Details  aspirin 81 MG tablet Take 81 mg by mouth at bedtime.     budesonide-formoterol (SYMBICORT) 160-4.5 MCG/ACT inhaler Inhale 2 puffs into the lungs 2 (two) times daily. Qty: 1 Inhaler, Refills: 6    clopidogrel (PLAVIX) 75 MG tablet TAKE 1 TABLET BY MOUTH DAILY WITH BREAKFAST Qty: 30 tablet, Refills: 3    escitalopram (LEXAPRO) 20 MG tablet TAKE ONE TABLET BY MOUTH ONE TIME DAILY Qty: 30 tablet, Refills: 5    levETIRAcetam (KEPPRA) 1000 MG tablet Take 1 tablet (1,000 mg total) by mouth 2 (two) times daily. For seizures Qty: 60 tablet, Refills: 0    levothyroxine (SYNTHROID,  LEVOTHROID) 75 MCG tablet Take 1 tablet (75 mcg total) by mouth daily. Qty: 30 tablet, Refills: 5    metoprolol tartrate (LOPRESSOR) 25 MG tablet Take One tablet by mouth twice daily to control BP Qty: 180 tablet, Refills: 0   Associated Diagnoses: Essential hypertension    Multiple Vitamin (MULTIVITAMIN WITH MINERALS) TABS tablet Take 1 tablet by mouth daily. Qty: 30 tablet, Refills: 3    pantoprazole (PROTONIX) 40 MG tablet TAKE ONE TABLET BY MOUTH ONE TIME DAILY Qty: 30 tablet, Refills: 5    pravastatin (PRAVACHOL) 20 MG tablet Take 1 tablet (20 mg total) by mouth every evening. NEEDS APPOINTMENT FOR FUTURE REFILLS OR 90-DAY SUPPLY Qty: 30 tablet, Refills: 0    pregabalin (LYRICA) 50 MG capsule Take one capsule by mouth in the morning and two capsules by mouth in the evening for pains. Qty: 90 capsule, Refills: 5    saccharomyces boulardii (FLORASTOR) 250 MG capsule Take 1 capsule (250 mg total) by mouth every evening. Qty: 60 capsule, Refills: 0    tiotropium (SPIRIVA) 18 MCG inhalation capsule Place 1 capsule (18 mcg total) into inhaler and inhale daily. Qty: 30 capsule, Refills: 6    vitamin  B-12 (CYANOCOBALAMIN) 1000 MCG tablet Take 1,000 mcg by mouth daily.    diphenoxylate-atropine (LOMOTIL) 2.5-0.025 MG tablet One up to 4 times daily if needed for diarrhea Qty: 50 tablet, Refills: 4   Associated Diagnoses: Diarrhea, unspecified type    glucose blood (ONE TOUCH TEST STRIPS) test strip 1 each by Other route 4 (four) times daily. Use as instructed      STOP taking these medications     insulin lispro (HUMALOG KWIKPEN) 100 UNIT/ML KiwkPen        Allergies  Allergen Reactions  . Ace Inhibitors Other (See Comments)    dizzy  . Cymbalta [Duloxetine Hcl] Other (See Comments)    dizzy  . Lipitor [Atorvastatin] Other (See Comments)    Caused pain all over body.  . Bee Venom Swelling  . Glucerna [Nutritional Supplements] Diarrhea  . Angiotensin Receptor Blockers Other  (See Comments)    unknown   Follow-up Information    Follow up with GREEN, Lenon Curt, MD. Schedule an appointment as soon as possible for a visit on 11/19/2015.   Specialty:  Internal Medicine   Why:  Appointment with Dr. Chilton Si is on 11/19/15 at 2pm   Contact information:   [address] [place] [address] [phone]       Follow up with KERR,JEFFREY, MD. Schedule an appointment as soon as possible for a visit in 1 week.   Specialty:  Endocrinology   Why:  Left message for Doctor office to call patient with an appointment at home if you do not get an call by tomorrow give them a call back   Contact information:   [address] [place] [address] [phone]       Follow up with [facility].   Specialty:  Skilled Nursing Facility   Contact information:   [address] [place] [address] [phone]       The results of significant diagnostics from this hospitalization (including imaging, microbiology, ancillary and laboratory) are listed below for reference.    Significant Diagnostic Studies: Ct Abdomen Pelvis Wo Contrast  10/28/2015  CLINICAL DATA:  Generalized weakness and diarrhea for 4-5 weeks. EXAM: CT ABDOMEN AND PELVIS WITHOUT CONTRAST TECHNIQUE: Multidetector CT imaging of the abdomen and pelvis was performed following the standard protocol without IV contrast. COMPARISON:  Abdominal MRI from 10/02/2015 FINDINGS: Lower chest: Emphysema with underlying chronic interstitial coarsening and basilar atelectasis. Hepatobiliary: No focal abnormality in the liver on this study without intravenous contrast. No evidence of hepatomegaly. There is no evidence for gallstones, gallbladder wall thickening, or pericholecystic fluid. No intrahepatic or extrahepatic biliary dilation. Pancreas: No focal mass lesion. No dilatation of the main duct. No intraparenchymal cyst. No peripancreatic edema. Spleen: No splenomegaly.  No focal mass lesion. Adrenals/Urinary Tract: No adrenal nodule or mass. Atherosclerotic calcification is seen and arterial anatomy of the right renal hilum. 3 mm nonobstructing stone is seen in the lower pole of the right kidney. 11 mm water density lesion in the interpolar right kidney is compatible with a cyst as was seen on the previous MRI. 16 mm mildly enhancing right renal lesions seen on previous MRI is difficult to see on today's uninfused CT scan, but appears to measure about 2.2 cm Left kidney is unremarkable on this uninfused exam. No evidence for hydronephrosis. No evidence for hydroureter. Mild bladder wall thickening is evident. Stomach/Bowel: Stomach is nondistended, likely accounting for the apparent wall thickening. Duodenum diverticulum noted. No  small bowel dilatation. Small bowel is not well distended which makes small bowel wall thickness difficult to assess, but despite this limitation, there does appear to be some true circumferential wall thickening in the terminal ileum. There may also be some abnormal wall thickening in the cecum, at the level of the ileocecal valve (see image 51 of series 2). The appendix is normal. Abnormal wall thickening is present in the rectum (see images 65 -72 of series 2). Vascular/Lymphatic: There is abdominal aortic atherosclerosis without aneurysm. There is no gastrohepatic or hepatoduodenal ligament lymphadenopathy. No intraperitoneal or retroperitoneal lymphadenopathy. No pelvic sidewall lymphadenopathy. Reproductive: Prostate gland is unremarkable. Penile prosthesis is evident. Other: No intraperitoneal free fluid. Musculoskeletal: Bone windows reveal no worrisome lytic or sclerotic osseous lesions. IMPRESSION: 1. Apparent circumferential wall thickening in the terminal ileum and cecum at the level of the ileocecal valve. This is associated with more definite wall thickening in the rectum. Given the history of diarrhea, infectious/inflammatory enterocolitis  would be a consideration. Neoplasm cannot be excluded. 2. Apparent interval increase in size of the right renal lesion evaluated by previous MRI and and found to be suspicious for possible papillary type renal cell carcinoma at that time. This is difficult to assess on today's noncontrast exam. Electronically Signed   By: Kennith Center M.D.   On: 10/28/2015 20:15   Dg Chest 1 View  10/28/2015  CLINICAL DATA:  Altered mental status.  Lethargy.  Cough. EXAM: CHEST 1 VIEW COMPARISON:  10/18/2015.  09/27/2015. FINDINGS: Heart size is normal with some left ventricular prominence. There is calcification of the aorta. There is chronic elevation of left hemidiaphragm. There is increased density at the left base when compared to previous studies consistent with left lower lobe pneumonia. No lobar collapse. No effusions. IMPRESSION: Chronically elevated left hemidiaphragm. Newly seen left base infiltrate consistent with bronchopneumonia. Electronically Signed   By: Paulina Fusi M.D.   On: 10/28/2015 15:50   Ct Head Wo Contrast  10/28/2015  CLINICAL DATA:  Generalized weakness and altered mental status. Pt has no complaints of pain or injury Hx: HTN, Diabetes, MI, Chronic fatigue EXAM: CT HEAD WITHOUT CONTRAST TECHNIQUE: Contiguous axial images were obtained from the base of the skull through the vertex without intravenous contrast. COMPARISON:  10/18/2015 FINDINGS: Brain: Mild atrophy. No evidence of acute infarction, hemorrhage, extra-axial collection, ventriculomegaly, or mass effect. Vascular: No hyperdense vessel or unexpected calcification. Atherosclerotic and physiologic intracranial calcifications. Skull: Negative for fracture or focal lesion. Sinuses/Orbits: No acute findings. Other: None. IMPRESSION: 1. Negative for bleed or other acute intracranial process. Electronically Signed   By: Corlis Leak M.D.   On: 10/28/2015 16:16   Ct Head Wo Contrast  10/18/2015  CLINICAL DATA:  Acute onset of hyperglycemia.  Generalized weakness. Initial encounter. EXAM: CT HEAD WITHOUT CONTRAST TECHNIQUE: Contiguous axial images were obtained from the base of the skull through the vertex without intravenous contrast. COMPARISON:  CT of the head performed 09/27/2015, and MRI of the brain performed 09/28/2015 FINDINGS: There is no evidence of acute infarction, mass lesion, or intra- or extra-axial hemorrhage on CT. Prominence of the ventricles and sulci suggest mild cortical volume loss. Mild periventricular and subcortical white matter change likely reflects small vessel ischemic microangiopathy. A chronic lacunar infarct is noted at the left basal ganglia. The brainstem and fourth ventricle are within normal limits. The cerebral hemispheres demonstrate grossly normal gray-white differentiation. No mass effect or midline shift is seen. There is no evidence of fracture; visualized osseous structures are  unremarkable in appearance. The orbits are within normal limits. The paranasal sinuses and mastoid air cells are well-aerated. No significant soft tissue abnormalities are seen. IMPRESSION: 1. No acute intracranial pathology seen on CT. 2. Mild cortical volume loss and scattered small vessel ischemic microangiopathy. 3. Chronic lacunar infarct at the left basal ganglia. Electronically Signed   By: Roanna Raider M.D.   On: 10/18/2015 22:28   Dg Chest Port 1 View  10/18/2015  CLINICAL DATA:  75 year old presenting with acute encephalopathy, hyperglycemia and generalized weakness which began earlier today. EXAM: PORTABLE CHEST 1 VIEW COMPARISON:  09/27/2015 and earlier. FINDINGS: Cardiac silhouette mildly to moderately enlarged, unchanged. Chronic elevation of the left hemidiaphragm. Linear scarring at the left lung base, unchanged. Patchy airspace opacities at the right lung base, new since the examination 2-3 weeks ago. No visible pleural effusions. IMPRESSION: 1. Patchy bronchopneumonia involving the right lung base. 2. Stable  cardiomegaly without pulmonary edema. Electronically Signed   By: Hulan Saas M.D.   On: 10/18/2015 22:17   Dg Swallowing Func-speech Pathology  10/31/2015  Objective Swallowing Evaluation: Type of Study: MBS-Modified Barium Swallow Study Patient Details Name: WING SCHOCH MRN: [id number] Date of Birth: Jan 16, 1941 Today's Date: 10/31/2015 Time: SLP Start Time (ACUTE ONLY): 1032-SLP Stop Time (ACUTE ONLY): 1047 SLP Time Calculation (min) (ACUTE ONLY): 15 min Past Medical History: Past Medical History Diagnosis Date . CAD (coronary artery disease)    LAD 40-50% stenosis, first diagonal small 90% stenosis, circumflex 70% stenosis, OM 80% stenosis, right coronary artery 80-90% stenosis. . Right upper lobe pneumonia 11/28/2008 . Anxiety 07/18/2008 . Irritable bowel syndrome 04/27/2008 . COPD (chronic obstructive pulmonary disease) (HCC) 02/27/2008 . B12 deficiency 02/16/2008   in 5/09: B12 262, MMA 1040 . Chronic diarrhea 01/01/2008   s/p EGD 10/06: H pylori + gastritis, duodenal biopsy normal (Dr. Elnoria Howard); s/p colonoscopy 10/06: hemorrhoids (Dr. Elnoria Howard); evaluated by Dr. Yancey Flemings  in 8/09: tissue transglutaminase AB negative, VIP normal, stool fat content normal, urine 5-HIAA normal; normal GES 11/09 (done for "fluctuating sugars") . 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 Orthostatic hypotension 02/09/2007 . Hypertension 02/09/2007 . Spinal stenosis in cervical region 05/03   MRI . Erectile dysfunction 09/27/2006   s/p penile implant . Anemia, iron deficiency 09/27/2006   neg colonoscopy 2006 - Dr. Elnoria Howard; ferritin 152; hgb  15.7  on 07/07 . Hypothyroidism 09/27/2006   TSH 2.639  07/07 . Hypersomnia 09/27/2006   evaluated by Dr. Jetty Duhamel (5/08) and Dr. Vickey Huger; PSG 8/09: chronic delayed sleep phase syndrome (patient sleeps during the day and is awake at night), nocturnal myoclonus (eval for RLS and IDA suggested). Pt advised to change sleeping behavior . Diabetes mellitus type II  09/07/1984   poorly controlled, complicated by peripheral neuropathy, microalbuminuria, mild non proliferative retinopathy, s/p DKA 7/05, on insulin pump x 4/09, started a pump vacation on 11/19/2008 . Hyperlipidemia  . Hypogonadism male 08/2011 . Hemorrhoids  . Chronic kidney disease  . Unspecified hereditary and idiopathic peripheral neuropathy  . Other chronic pain  . Other malaise and fatigue  . Depression  . Hypertrophy of prostate without urinary obstruction and other lower urinary tract symptoms (LUTS)  . Lumbago  . Heart attack (HCC) 03/2014   mild . Renal mass, right: Incentendal finding MRI 10/02/2015 10/03/2015 . Hypogonadism in male 10/03/2015 . Asthma  Past Surgical History: Past Surgical History Procedure Laterality Date . Penile prosthesis implant   . Tonsillectomy   . Colonoscopy  06/25/2005   Dr. Jeani Hawking .  Left heart catheterization with coronary angiogram [name] 03/13/2014   Procedure: LEFT HEART CATHETERIZATION WITH CORONARY ANGIOGRAM;  Surgeon: Peter M Swaziland, MD;  Location: [facility] CATH LAB;  Service: Cardiovascular;  Laterality: [name]; . Peripheral vascular catheterization [name] 02/19/2015   Procedure: Abdominal Aortogram;  Surgeon: Nada Libman, MD;  Location: [facility] INVASIVE CV LAB;  Service: Cardiovascular;  Laterality: [name]; . Peripheral vascular catheterization Left 02/19/2015   Procedure: Lower Extremity Angiography;  Surgeon: Nada Libman, MD;  Location: [facility] INVASIVE CV LAB;  Service: Cardiovascular;  Laterality: Left; HPI: 75 y.o. male with h/o CAD, recurrent pneumonia, COPD, HTN, DM type 2, HLD, depression, asthma and "mild" heart attack (2015) who presented to ED 2/20 after syncopal episode at endocrinologist's office. Pt admitted last week for pneumonia and uncontrolled DM. CT Head 2/20 negative for bleed or other acute intracranial process. CXR 2/20 newly seen L base infiltrate consistent with bronchopneumonia. BSE 10/21/2015 recommended regular diet and thin liquids. No Data Recorded Assessment /  Plan / Recommendation CHL IP CLINICAL IMPRESSIONS 10/31/2015 Therapy Diagnosis Mild pharyngeal phase dysphagia Clinical Impression Pt exhibited mild pharyngeal phase dysphagia characterized by sensorimotor deficits. During intake of thin liquids via straw, silent penetration to the vocal folds observed due to decreased laryngeal elevation and epiglottic deflection. SLP provided min verbal cues to clear. Further penetration episodes prevented with removal of straw. Min residue noted in the vallecula and pyriform sinuses and with min verbal cues pt able to initiate volitional swallow to clear. Mastication WFL. MBS does not diagnose MBS below UES, however scan of esophagus noted esophageal stasis appearing in distal toward mid esophagus. Pt educated re: diet recommendation of regular diet and thin liquids (no straws), meds whole in puree and esophageal precautions (eat upright, stay upright at least 45 minutes after PO intake, alternate solids and liquids) as well as swallow 2x intermittently throughout meal to clear residue. SLP will f/u to determine diet toleration. Suggest assessment of esophagus to evaluate stasis. Impact on safety and function Mild aspiration risk   CHL IP TREATMENT RECOMMENDATION 10/31/2015 Treatment Recommendations Therapy as outlined in treatment plan below   Prognosis 10/31/2015 Prognosis for Safe Diet Advancement Good Barriers to Reach Goals -- Barriers/Prognosis Comment -- CHL IP DIET RECOMMENDATION 10/31/2015 SLP Diet Recommendations Regular solids;Thin liquid Liquid Administration via No straw;Cup Medication Administration Whole meds with puree Compensations Minimize environmental distractions;Slow rate;Small sips/bites;Follow solids with liquid Postural Changes Seated upright at 90 degrees;Remain semi-upright after after feeds/meals (Comment)   CHL IP OTHER RECOMMENDATIONS 10/31/2015 Recommended Consults Consider esophageal assessment Oral Care Recommendations Oral care BID Other  Recommendations --   CHL IP FOLLOW UP RECOMMENDATIONS 10/31/2015 Follow up Recommendations (No Data)   CHL IP FREQUENCY AND DURATION 10/31/2015 Speech Therapy Frequency (ACUTE ONLY) min 2x/week Treatment Duration 2 weeks      CHL IP ORAL PHASE 10/31/2015 Oral Phase WFL Oral - Pudding Teaspoon -- Oral - Pudding Cup -- Oral - Honey Teaspoon -- Oral - Honey Cup -- Oral - Nectar Teaspoon -- Oral - Nectar Cup -- Oral - Nectar Straw -- Oral - Thin Teaspoon -- Oral - Thin Cup -- Oral - Thin Straw -- Oral - Puree -- Oral - Mech Soft -- Oral - Regular -- Oral - Multi-Consistency -- Oral - Pill -- Oral Phase - Comment --  CHL IP PHARYNGEAL PHASE 10/31/2015 Pharyngeal Phase Impaired Pharyngeal- Pudding Teaspoon -- Pharyngeal -- Pharyngeal- Pudding Cup -- Pharyngeal -- Pharyngeal- Honey Teaspoon -- Pharyngeal -- Pharyngeal- Honey Cup -- Pharyngeal -- Pharyngeal- Nectar  Teaspoon -- Pharyngeal -- Pharyngeal- Nectar Cup -- Pharyngeal -- Pharyngeal- Nectar Straw -- Pharyngeal -- Pharyngeal- Thin Teaspoon -- Pharyngeal -- Pharyngeal- Thin Cup Penetration/Aspiration during swallow;Reduced airway/laryngeal closure;Reduced laryngeal elevation;Reduced epiglottic inversion;Pharyngeal residue - valleculae;Pharyngeal residue - pyriform;Reduced tongue base retraction Pharyngeal Material enters airway, remains ABOVE vocal cords then ejected out Pharyngeal- Thin Straw Reduced epiglottic inversion;Reduced laryngeal elevation;Reduced airway/laryngeal closure;Reduced tongue base retraction;Penetration/Aspiration during swallow;Pharyngeal residue - valleculae;Pharyngeal residue - pyriform Pharyngeal Material enters airway, CONTACTS cords and not ejected out Pharyngeal- Puree Delayed swallow initiation-vallecula;Pharyngeal residue - valleculae;Reduced tongue base retraction Pharyngeal -- Pharyngeal- Mechanical Soft -- Pharyngeal -- Pharyngeal- Regular Delayed swallow initiation-vallecula;Reduced tongue base retraction;Pharyngeal residue - valleculae  Pharyngeal -- Pharyngeal- Multi-consistency -- Pharyngeal -- Pharyngeal- Pill -- Pharyngeal -- Pharyngeal Comment --  CHL IP CERVICAL ESOPHAGEAL PHASE 10/31/2015 Cervical Esophageal Phase WFL Pudding Teaspoon -- Pudding Cup -- Honey Teaspoon -- Honey Cup -- Nectar Teaspoon -- Nectar Cup -- Nectar Straw -- Thin Teaspoon -- Thin Cup -- Thin Straw -- Puree -- Mechanical Soft -- Regular -- Multi-consistency -- Pill -- Cervical Esophageal Comment -- No flowsheet data found. Royce Macadamia 10/31/2015, 12:02 PM  Breck Coons Lonell Face[employer] [phone]               Microbiology: Recent Results (from the past 240 hour(s))  Urine culture     Status: None   Collection Time: 10/28/15  6:07 PM  Result Value Ref Range Status   Specimen Description URINE, CATHETERIZED  Final   Special Requests NONE  Final   Culture NO GROWTH 1 DAY  Final   Report Status 10/29/2015 FINAL  Final  Culture, blood (routine x 2)     Status: None   Collection Time: 10/29/15 12:55 AM  Result Value Ref Range Status   Specimen Description BLOOD RIGHT HAND  Final   Special Requests BOTTLES DRAWN AEROBIC ONLY 5CC  Final   Culture NO GROWTH 5 DAYS  Final   Report Status 11/03/2015 FINAL  Final  Culture, blood (routine x 2)     Status: None   Collection Time: 10/29/15  1:03 AM  Result Value Ref Range Status   Specimen Description BLOOD RIGHT ARM  Final   Special Requests IN PEDIATRIC BOTTLE 2CC  Final   Culture  Setup Time   Final    GRAM POSITIVE COCCI IN CLUSTERS AEROBIC BOTTLE ONLY CRITICAL RESULT CALLED TO, READ BACK BY AND VERIFIED WITH: [facility] 11/01/15 @ 1006 M VESTAL    Culture   Final    STAPHYLOCOCCUS SPECIES (COAGULASE NEGATIVE) THE SIGNIFICANCE OF ISOLATING THIS ORGANISM FROM A SINGLE SET OF BLOOD CULTURES WHEN MULTIPLE SETS ARE DRAWN IS UNCERTAIN. PLEASE NOTIFY THE MICROBIOLOGY DEPARTMENT WITHIN ONE WEEK IF SPECIATION AND SENSITIVITIES ARE REQUIRED.    Report Status 11/03/2015 FINAL  Final  MRSA PCR  Screening     Status: Abnormal   Collection Time: 10/29/15  5:32 AM  Result Value Ref Range Status   MRSA by PCR POSITIVE (A) NEGATIVE Final    Comment:        The GeneXpert MRSA Assay (FDA approved for NASAL specimens only), is one component of a comprehensive MRSA colonization surveillance program. It is not intended to diagnose MRSA infection nor to guide or monitor treatment for MRSA infections. RESULT CALLED TO, READ BACK BY AND VERIFIED WITH: S. MOORE RN 10/29/15 AT 0815 BY A. DAVIS   C difficile quick scan w PCR reflex     Status: None  Collection Time: 10/29/15  7:30 AM  Result Value Ref Range Status   C Diff antigen NEGATIVE NEGATIVE Final   C Diff toxin NEGATIVE NEGATIVE Final   C Diff interpretation Negative for toxigenic C. difficile  Final  Gastrointestinal Panel by PCR , Stool     Status: None   Collection Time: 10/29/15  7:30 AM  Result Value Ref Range Status   Campylobacter species NOT DETECTED NOT DETECTED Final   Plesimonas shigelloides NOT DETECTED NOT DETECTED Final   Salmonella species NOT DETECTED NOT DETECTED Final   Yersinia enterocolitica NOT DETECTED NOT DETECTED Final   Vibrio species NOT DETECTED NOT DETECTED Final   Vibrio cholerae NOT DETECTED NOT DETECTED Final   Enteroaggregative E coli (EAEC) NOT DETECTED NOT DETECTED Final   Enteropathogenic E coli (EPEC) NOT DETECTED NOT DETECTED Final   Enterotoxigenic E coli (ETEC) NOT DETECTED NOT DETECTED Final   Shiga like toxin producing E coli (STEC) NOT DETECTED NOT DETECTED Final   E. coli O157 NOT DETECTED NOT DETECTED Final   Shigella/Enteroinvasive E coli (EIEC) NOT DETECTED NOT DETECTED Final   Cryptosporidium NOT DETECTED NOT DETECTED Final   Cyclospora cayetanensis NOT DETECTED NOT DETECTED Final   Entamoeba histolytica NOT DETECTED NOT DETECTED Final   Giardia lamblia NOT DETECTED NOT DETECTED Final   Adenovirus F40/41 NOT DETECTED NOT DETECTED Final   Astrovirus NOT DETECTED NOT DETECTED  Final   Norovirus GI/GII NOT DETECTED NOT DETECTED Final   Rotavirus A NOT DETECTED NOT DETECTED Final   Sapovirus (I, II, IV, and V) NOT DETECTED NOT DETECTED Final     Labs: Basic Metabolic Panel:  Recent Labs Lab 10/29/15 2335 10/30/15 0406 10/31/15 0445 11/02/15 0607 11/03/15 0640  NA 139 140 142 142 142  K 3.7 4.1 3.8 4.2 4.9  CL 109 111 108 105 103  CO2 18* 18* 22 25 28   GLUCOSE 135* 125* 244* 86 243*  BUN 37* 33* 19 24* 25*  CREATININE 1.55* 1.42* 1.06 1.41* 1.41*  CALCIUM 8.6* 8.7* 8.9 8.8* 9.2   Liver Function Tests:  Recent Labs Lab 10/29/15 0721  AST 19  ALT 20  ALKPHOS 82  BILITOT 1.2  PROT 5.6*  ALBUMIN 3.0*   No results for input(s): LIPASE, AMYLASE in the last 168 hours. No results for input(s): AMMONIA in the last 168 hours. CBC:  Recent Labs Lab 10/29/15 0721 10/31/15 0445 11/02/15 0607 11/03/15 0640  WBC 7.9 4.4 6.7 6.7  HGB 9.3* 9.5* 9.0* 8.9*  HCT 29.1* 29.0* 27.4* 27.8*  MCV 95.1 92.1 93.5 93.9  PLT 254 194 162 171   Cardiac Enzymes:  Recent Labs Lab 10/29/15 0721 10/29/15 1155 10/29/15 1944 10/30/15 1655  TROPONINI <0.03 0.06* 0.12* 0.08*   BNP: BNP (last 3 results)  Recent Labs  10/20/15 1043  BNP 280.3*    ProBNP (last 3 results) No results for input(s): PROBNP in the last 8760 hours.  CBG:  Recent Labs Lab 11/03/15 2134 11/04/15 0247 11/04/15 0541 11/04/15 0752 11/04/15 1150  GLUCAP 272* 142* 101* 98 144*       SignedZannie Cove MD.  [facility] 11/04/2015, 3:20 PM

## 2015-11-04 NOTE — Progress Notes (Signed)
Pharmacist Provided - Patient Medication Education Prior to Discharge   Benjamin Hodge is an 75 y.o. male who presented to [facility] on 10/28/2015 with a chief complaint of  Chief Complaint  Patient presents with  . Seizures  . Hyperglycemia     [x]  Patient will be discharged with 3 new medications []  Patient being discharged without any new medications  The following medications were discussed with the patient: tramadol, Tuojeo, loperamide, levofloxacin  Pain Control medications: [x]  Yes    []  No  Diabetes Medications: [x]  Yes    []  No  Heart Failure Medications: []  Yes    [x]  No  Anticoagulation Medications:  []  Yes    [x]  No  Antibiotics at discharge: [x]  Yes    []  No  Allergy Assessment Completed and Updated: [x]  Yes    []  No Identified Patient Allergies:  Allergies  Allergen Reactions  . Ace Inhibitors Other (See Comments)    dizzy  . Cymbalta [Duloxetine Hcl] Other (See Comments)    dizzy  . Lipitor [Atorvastatin] Other (See Comments)    Caused pain all over body.  . Bee Venom Swelling  . Glucerna [Nutritional Supplements] Diarrhea  . Angiotensin Receptor Blockers Other (See Comments)    unknown     Medication Adherence Assessment: []  Excellent (no doses missed/week)      [x]  Good (1 dose missed/week)      []  Partial (2-3 doses missed/week)      []  Poor (>3 doses missed/week)  Barriers to Obtaining Medications: []  Yes [x]  No  Assessment: Spoke w/ Benjamin Hodge and his daughter-in-law at length about his multiple recent admissions, issues at discharge with SNF not giving appropriate medications and missing orders, etc. I showed both how to use his new insulin, and learned that the pt had previously been on Tuojeo but was now prescribed Lantus. I was able to call Dr. Jomarie Longs and get the Lantus changed back to [facility]. The pt and daughter in law also expressed concerns about chronic back pain, and how he was now without pain medications b/c his PCP discontinued his  hydrocodone due to lethargy. They asked what pain medications he might be able to try and I spoke with them about how tramadol might be an option. After speaking with Dr. Jomarie Longs, she agreed that this would be appropriate and wrote a prescription for him. I also suggested various things to help with the discharge process including providing SNF with a copy of his discharge medication summary, bringing his medications to the facility so they are given, etc. I answered all the questions they had. Hopefully this discharge will go more smoothly and we will be able to keep him out of the hospital.  Time spent preparing for discharge counseling: 10 Time spent counseling patient: 34  Arcola Jansky, PharmD Clinical Pharmacy Resident Pager: [phone] 11/04/2015, 3:16 PM

## 2015-11-04 NOTE — Progress Notes (Signed)
Physical Therapy Treatment Patient Details Name: Benjamin Hodge MRN: [id number] DOB: Dec 09, 1940 Today's Date: 11/04/2015    History of Present Illness 75 y.o. male admitted on 10/28/2015 with pneumonia    PT Comments    Pt progressing towards physical therapy goals. Was able to perform transfers and ambulation with close guard to supervision for safety and RW for UE support. Pt motivated to work with therapy and was enthusiastic about HEP. Pt reports that he has peripheral neuropathy and often loses his balance when his eyes are closed (especially in the shower while rinsing his hair). Encouraged pt to pursue balance training with therapy when he discharges. Will continue to follow.   Follow Up Recommendations  SNF;Supervision/Assistance - 24 hour     Equipment Recommendations  Rolling walker with 5" wheels    Recommendations for Other Services       Precautions / Restrictions Precautions Precautions: Fall Restrictions Weight Bearing Restrictions: No    Mobility  Bed Mobility Overal bed mobility: Needs Assistance Bed Mobility: Supine to Sit     Supine to sit: Supervision     General bed mobility comments: Pt was able to transition to EOB without assistance. Supervision for safety provided.   Transfers Overall transfer level: Needs assistance Equipment used: Rolling walker (2 wheeled) Transfers: Sit to/from Stand Sit to Stand: Min guard         General transfer comment: Close guard for safety. VC's for hand placement on seated surface for safety.   Ambulation/Gait Ambulation/Gait assistance: Min guard Ambulation Distance (Feet): 375 Feet Assistive device: Rolling walker (2 wheeled) Gait Pattern/deviations: Step-through pattern;Decreased stride length;Trunk flexed Gait velocity: Decreased Gait velocity interpretation: Below normal speed for age/gender General Gait Details: VC's for improved posture and increased step/stride length. Pt able to make corrective  changes for short bouts but required cueing again to maintain.    Stairs            Wheelchair Mobility    Modified Rankin (Stroke Patients Only)       Balance Overall balance assessment: Needs assistance Sitting-balance support: Feet supported;No upper extremity supported Sitting balance-Leahy Scale: Good     Standing balance support: No upper extremity supported;During functional activity Standing balance-Leahy Scale: Fair Standing balance comment: Static standing edge of chair prior to initiating stand>sit.                     Cognition Arousal/Alertness: Awake/alert Behavior During Therapy: WFL for tasks assessed/performed;Impulsive Overall Cognitive Status: Within Functional Limits for tasks assessed                      Exercises General Exercises - Lower Extremity Long Arc Quad: 15 reps Hip ABduction/ADduction: 15 reps Straight Leg Raises: 15 reps    General Comments        Pertinent Vitals/Pain Pain Assessment: Faces Faces Pain Scale: No hurt    Home Living                      Prior Function            PT Goals (current goals can now be found in the care plan section) Acute Rehab PT Goals Patient Stated Goal: none stated PT Goal Formulation: With patient Time For Goal Achievement: 11/13/15 Potential to Achieve Goals: Good Progress towards PT goals: Progressing toward goals    Frequency  Min 2X/week    PT Plan Current plan remains appropriate    Co-evaluation  End of Session Equipment Utilized During Treatment: Gait belt Activity Tolerance: Patient tolerated treatment well Patient left: in chair;with call bell/phone within reach;with chair alarm set     Time: 5621-3086 PT Time Calculation (min) (ACUTE ONLY): 33 min  Charges:  $Gait Training: 8-22 mins $Therapeutic Exercise: 8-22 mins                    G Codes:      Benjamin Hodge 2015/11/12, 10:00 AM   Benjamin Hodge, PT, DPT Acute  Rehabilitation Services Pager: [phone]

## 2015-11-04 NOTE — Progress Notes (Signed)
Inpatient Diabetes Program Recommendations  AACE/ADA: New Consensus Statement on Inpatient Glycemic Control (2015)  Target Ranges:  Prepandial:   less than 140 mg/dL      Peak postprandial:   less than 180 mg/dL (1-2 hours)      Critically ill patients:  140 - 180 mg/dL  Results for Benjamin Hodge, Benjamin Hodge (MRN [id number]) as of 11/04/2015 10:20  Ref. Range 11/03/2015 12:42 11/03/2015 18:20 11/03/2015 21:34 11/04/2015 02:47 11/04/2015 05:41 11/04/2015 07:52  Glucose-Capillary Latest Ref Range: 65-99 mg/dL 094 (H) 709 (H) 628 (H) 142 (H) 101 (H) 98   Review of Glycemic Control  Current orders for Inpatient glycemic control: Lantus 12 units daily, Novolog 0-5 units TID with meals, Novolog 0-5 units QHS, Novolog 4 units TID with meals for meal coverage  Inpatient Diabetes Program Recommendations: Insulin - Meal Coverage: Please consider increasing meal coverage to Novolog 6 units TID with meals.  Thanks, Orlando Penner, RN, MSN, CDE Diabetes Coordinator Inpatient Diabetes Program [phone] (Team Pager from 8am to 5pm) [phone] ([facility] office) [phone] [facility] office) [phone] [facility] office)

## 2015-11-04 NOTE — Care Management Important Message (Signed)
Important Message  Patient Details  Name: Benjamin Hodge MRN: [id number] Date of Birth: 1941/03/22   Medicare Important Message Given:  Yes    Lawerance Sabal, RN 11/04/2015, 11:07 AMImportant Message  Patient Details  Name: Benjamin Hodge MRN: [id number] Date of Birth: 1941/06/16   Medicare Important Message Given:  Yes    Lawerance Sabal, RN 11/04/2015, 11:06 AM

## 2015-11-04 NOTE — Progress Notes (Signed)
Patient will discharge to [facility] Anticipated discharge date: 2/27 Family notified: [phone] Transportation by Thayer Ohm- pt dtr in law  CSW signing off.  Merlyn Lot, LCSWA Clinical Social Worker [phone]

## 2015-11-04 NOTE — Progress Notes (Signed)
Nsg Discharge Note  Admit Date:  10/28/2015 Discharge date: 11/04/2015   LORRIE VANCE to be D/C'd Nursing Home per MD order.  AVS completed.  Copy for chart, and copy for patient signed, and dated. Patient/caregiver able to verbalize understanding.  Discharge Medication:   Medication List    STOP taking these medications        HUMALOG KWIKPEN 100 UNIT/ML KiwkPen  Generic drug:  insulin lispro      TAKE these medications        aspirin 81 MG tablet  Take 81 mg by mouth at bedtime.     budesonide-formoterol 160-4.5 MCG/ACT inhaler  Commonly known as:  SYMBICORT  Inhale 2 puffs into the lungs 2 (two) times daily.     clopidogrel 75 MG tablet  Commonly known as:  PLAVIX  TAKE 1 TABLET BY MOUTH DAILY WITH BREAKFAST     diphenoxylate-atropine 2.5-0.025 MG tablet  Commonly known as:  LOMOTIL  One up to 4 times daily if needed for diarrhea     escitalopram 20 MG tablet  Commonly known as:  LEXAPRO  TAKE ONE TABLET BY MOUTH ONE TIME DAILY     insulin aspart 100 UNIT/ML injection  Commonly known as:  novoLOG  Inject 4 Units into the skin 3 (three) times daily with meals.     insulin aspart 100 UNIT/ML injection  Commonly known as:  novoLOG  70-149: 0 units,  150-210 mg/dL- 1 unit, 143-888 mg/dL-2 units, 757-972 mg/dL-3 units, 820-601 mg/dL-4 units, 561-537 mg/dL-5 units and call MD     Insulin Glargine 300 UNIT/ML Sopn  Commonly known as:  TOUJEO SOLOSTAR  Inject 12 Units into the skin every morning.     levETIRAcetam 1000 MG tablet  Commonly known as:  KEPPRA  Take 1 tablet (1,000 mg total) by mouth 2 (two) times daily. For seizures     levofloxacin 250 MG tablet  Commonly known as:  LEVAQUIN  Take 1 tablet (250 mg total) by mouth daily. For 1day     levothyroxine 75 MCG tablet  Commonly known as:  SYNTHROID, LEVOTHROID  Take 1 tablet (75 mcg total) by mouth daily.     loperamide 2 MG capsule  Commonly known as:  IMODIUM  Take 1 capsule (2 mg total) by  mouth every morning.     metoprolol tartrate 25 MG tablet  Commonly known as:  LOPRESSOR  Take One tablet by mouth twice daily to control BP     multivitamin with minerals Tabs tablet  Take 1 tablet by mouth daily.     ONE TOUCH TEST STRIPS test strip  Generic drug:  glucose blood  1 each by Other route 4 (four) times daily. Use as instructed     pantoprazole 40 MG tablet  Commonly known as:  PROTONIX  TAKE ONE TABLET BY MOUTH ONE TIME DAILY     pravastatin 20 MG tablet  Commonly known as:  PRAVACHOL  Take 1 tablet (20 mg total) by mouth every evening. NEEDS APPOINTMENT FOR FUTURE REFILLS OR 90-DAY SUPPLY     pregabalin 50 MG capsule  Commonly known as:  LYRICA  Take one capsule by mouth in the morning and two capsules by mouth in the evening for pains.     saccharomyces boulardii 250 MG capsule  Commonly known as:  FLORASTOR  Take 1 capsule (250 mg total) by mouth every evening.     tiotropium 18 MCG inhalation capsule  Commonly known as:  SPIRIVA  Place 1  capsule (18 mcg total) into inhaler and inhale daily.     traMADol 50 MG tablet  Commonly known as:  ULTRAM  Take 1 tablet (50 mg total) by mouth every 6 (six) hours as needed.     vitamin B-12 1000 MCG tablet  Commonly known as:  CYANOCOBALAMIN  Take 1,000 mcg by mouth daily.        Discharge Assessment: Filed Vitals:   11/04/15 1057 11/04/15 1124  BP: 86/43 130/72  Pulse:    Temp:    Resp:     Skin clean, dry and intact without evidence of skin break down, no evidence of skin tears noted. IV catheter discontinued intact. Site without signs and symptoms of complications - no redness or edema noted at insertion site, patient denies c/o pain - only slight tenderness at site.  Dressing with slight pressure applied.  D/c Instructions-Education: Discharge instructions given to patient/family with verbalized understanding. D/c education completed with patient/family including follow up instructions, medication  list, d/c activities limitations if indicated, with other d/c instructions as indicated by MD - patient able to verbalize understanding, all questions fully answered. Patient instructed to return to ED, call 911, or call MD for any changes in condition.  Patient escorted via WC, and D/C SNF via private auto. Report called into [facility].  Kern Reap, RN 11/04/2015 3:44 PM

## 2015-11-06 ENCOUNTER — Encounter: Payer: Self-pay | Admitting: Adult Health

## 2015-11-06 ENCOUNTER — Non-Acute Institutional Stay (SKILLED_NURSING_FACILITY): Payer: Medicare Other | Admitting: Adult Health

## 2015-11-06 DIAGNOSIS — I10 Essential (primary) hypertension: Secondary | ICD-10-CM | POA: Diagnosis not present

## 2015-11-06 DIAGNOSIS — E785 Hyperlipidemia, unspecified: Secondary | ICD-10-CM

## 2015-11-06 DIAGNOSIS — G40209 Localization-related (focal) (partial) symptomatic epilepsy and epileptic syndromes with complex partial seizures, not intractable, without status epilepticus: Secondary | ICD-10-CM | POA: Diagnosis not present

## 2015-11-06 DIAGNOSIS — J441 Chronic obstructive pulmonary disease with (acute) exacerbation: Secondary | ICD-10-CM

## 2015-11-06 DIAGNOSIS — E034 Atrophy of thyroid (acquired): Secondary | ICD-10-CM

## 2015-11-06 DIAGNOSIS — E038 Other specified hypothyroidism: Secondary | ICD-10-CM

## 2015-11-06 DIAGNOSIS — K580 Irritable bowel syndrome with diarrhea: Secondary | ICD-10-CM

## 2015-11-06 DIAGNOSIS — I25119 Atherosclerotic heart disease of native coronary artery with unspecified angina pectoris: Secondary | ICD-10-CM | POA: Diagnosis not present

## 2015-11-06 DIAGNOSIS — F411 Generalized anxiety disorder: Secondary | ICD-10-CM | POA: Diagnosis not present

## 2015-11-06 NOTE — Progress Notes (Signed)
Patient ID: Benjamin Hodge, male   DOB: April 15, 1941, 75 y.o.   MRN: [id number]   Facility:  [facility]       Allergies  Allergen Reactions  . Ace Inhibitors Other (See Comments)    dizzy  . Cymbalta [Duloxetine Hcl] Other (See Comments)    dizzy  . Lipitor [Atorvastatin] Other (See Comments)    Caused pain all over body.  . Bee Venom Swelling  . Glucerna [Nutritional Supplements] Diarrhea  . Angiotensin Receptor Blockers Other (See Comments)    unknown    Chief Complaint  Patient presents with  . Hospitalization Follow-up    Hospital Follow up    HPI:  He has had several recent hospitalizations. He was sent for assisted to the ED for aspiration; diabetes; acute kidney injury and pneumonia. He is here for short term rehab. More than likely this does represent a long term placement for him. He is unable to fully participate in the hpi or ros.    Past Medical History  Diagnosis Date  . CAD (coronary artery disease)     LAD 40-50% stenosis, first diagonal small 90% stenosis, circumflex 70% stenosis, OM 80% stenosis, right coronary artery 80-90% stenosis.  . Right upper lobe pneumonia 11/28/2008  . Anxiety 07/18/2008  . Irritable bowel syndrome 04/27/2008  . COPD (chronic obstructive pulmonary disease) (HCC) 02/27/2008  . B12 deficiency 02/16/2008    in 5/09: B12 262, MMA 1040  . Chronic diarrhea 01/01/2008    s/p EGD 10/06: H pylori + gastritis, duodenal biopsy normal (Dr. Elnoria Howard); s/p colonoscopy 10/06: hemorrhoids (Dr. Elnoria Howard); evaluated by Dr. Yancey Flemings  in 8/09: tissue transglutaminase AB negative, VIP normal, stool fat content normal, urine 5-HIAA normal; normal GES 11/09 (done for "fluctuating sugars")  . 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 Orthostatic hypotension 02/09/2007  . Hypertension 02/09/2007  . Spinal stenosis in cervical region 05/03    MRI  . Erectile dysfunction 09/27/2006    s/p penile implant  . Anemia, iron deficiency  09/27/2006    neg colonoscopy 2006 - Dr. Elnoria Howard; ferritin 152; hgb  15.7  on 07/07  . Hypothyroidism 09/27/2006    TSH 2.639  07/07  . Hypersomnia 09/27/2006    evaluated by Dr. Jetty Duhamel (5/08) and Dr. Vickey Huger; PSG 8/09: chronic delayed sleep phase syndrome (patient sleeps during the day and is awake at night), nocturnal myoclonus (eval for RLS and IDA suggested). Pt advised to change sleeping behavior  . Diabetes mellitus type II 09/07/1984    poorly controlled, complicated by peripheral neuropathy, microalbuminuria, mild non proliferative retinopathy, s/p DKA 7/05, on insulin pump x 4/09, started a pump vacation on 11/19/2008  . Hyperlipidemia   . 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 Hemorrhoids   . Chronic kidney disease   . Unspecified hereditary and idiopathic peripheral neuropathy   . Other chronic pain   . Other malaise and fatigue   . Depression   . Hypertrophy of prostate without urinary obstruction and other lower urinary tract symptoms (LUTS)   . Lumbago   . Heart attack (HCC) 03/2014    mild  . Renal mass, right: Incentendal finding MRI 10/02/2015 10/03/2015  . Hypogonadism in male 10/03/2015  . Asthma     Past Surgical History  Procedure Laterality Date  . Penile prosthesis implant    . Tonsillectomy    . Colonoscopy  06/25/2005    Dr. Jeani Hawking  . Left heart catheterization with coronary angiogram [name] 03/13/2014    Procedure: LEFT HEART  CATHETERIZATION WITH CORONARY ANGIOGRAM;  Surgeon: Peter M Swaziland, MD;  Location: [facility] CATH LAB;  Service: Cardiovascular;  Laterality: [name];  . Peripheral vascular catheterization [name] 02/19/2015    Procedure: Abdominal Aortogram;  Surgeon: Nada Libman, MD;  Location: [facility] INVASIVE CV LAB;  Service: Cardiovascular;  Laterality: [name];  . Peripheral vascular catheterization Left 02/19/2015    Procedure: Lower Extremity Angiography;  Surgeon: Nada Libman, MD;  Location: [facility] INVASIVE CV LAB;  Service: Cardiovascular;  Laterality: Left;    VITAL  SIGNS BP 160/98 mmHg  Pulse 81  Temp(Src) 96.1 F (35.6 C) (Oral)  Resp 20  Ht 5\' 6"  (1.676 m)  Wt 128 lb (58.06 kg)  BMI 20.67 kg/m2  Patient's Medications  New Prescriptions   No medications on file  Previous Medications   ASPIRIN 81 MG TABLET    Take 81 mg by mouth at bedtime.    BUDESONIDE-FORMOTEROL (SYMBICORT) 160-4.5 MCG/ACT INHALER    Inhale 2 puffs into the lungs 2 (two) times daily.   CLOPIDOGREL (PLAVIX) 75 MG TABLET    TAKE 1 TABLET BY MOUTH DAILY WITH BREAKFAST   DIPHENOXYLATE-ATROPINE (LOMOTIL) 2.5-0.025 MG TABLET    Take 1 tablet by mouth every 6 (six) hours as needed for diarrhea or loose stools.   ESCITALOPRAM (LEXAPRO) 20 MG TABLET    TAKE ONE TABLET BY MOUTH ONE TIME DAILY   INSULIN ASPART (NOVOLOG) 100 UNIT/ML INJECTION    Inject 4 Units into the skin 3 (three) times daily with meals.   INSULIN ASPART (NOVOLOG) 100 UNIT/ML INJECTION    70-149: 0 units,  150-210 mg/dL- 1 unit, 557-322 mg/dL-2 units, 025-427 mg/dL-3 units, 062-376 mg/dL-4 units, 283-151 mg/dL-5 units and call MD   INSULIN GLARGINE (TOUJEO SOLOSTAR) 300 UNIT/ML SOPN    Inject 12 Units into the skin 2 (two) times daily.   LEVETIRACETAM (KEPPRA) 1000 MG TABLET    Take 1 tablet (1,000 mg total) by mouth 2 (two) times daily. For seizures   LEVOFLOXACIN (LEVAQUIN) 250 MG TABLET    Take 1 tablet (250 mg total) by mouth daily. For 1day   LEVOTHYROXINE (SYNTHROID, LEVOTHROID) 75 MCG TABLET    Take 1 tablet (75 mcg total) by mouth daily.   LOPERAMIDE (IMODIUM) 2 MG CAPSULE    Take 1 capsule (2 mg total) by mouth every morning.   METOPROLOL TARTRATE (LOPRESSOR) 25 MG TABLET    Take One tablet by mouth twice daily to control BP   MULTIPLE VITAMIN (MULTIVITAMIN WITH MINERALS) TABS TABLET    Take 1 tablet by mouth daily.   PANTOPRAZOLE (PROTONIX) 40 MG TABLET    TAKE ONE TABLET BY MOUTH ONE TIME DAILY   PRAVASTATIN (PRAVACHOL) 20 MG TABLET    Take 1 tablet (20 mg total) by mouth every evening. NEEDS APPOINTMENT FOR  FUTURE REFILLS OR 90-DAY SUPPLY   PREGABALIN (LYRICA) 50 MG CAPSULE    Take one capsule by mouth in the morning and two capsules by mouth in the evening for pains.   SACCHAROMYCES BOULARDII (FLORASTOR) 250 MG CAPSULE    Take 1 capsule (250 mg total) by mouth every evening.   TIOTROPIUM (SPIRIVA) 18 MCG INHALATION CAPSULE    Place 1 capsule (18 mcg total) into inhaler and inhale daily.   TRAMADOL (ULTRAM) 50 MG TABLET    Take 1 tablet (50 mg total) by mouth every 6 (six) hours as needed.   VITAMIN B-12 (CYANOCOBALAMIN) 1000 MCG TABLET    Take 1,000 mcg by mouth daily.  Modified  Medications   No medications on file  Discontinued Medications     SIGNIFICANT DIAGNOSTIC EXAMS  10-28-15: chest x-ray: Chronically elevated left hemidiaphragm. Newly seen left base infiltrate consistent with bronchopneumonia.  10-28-15: ct of head: 1. Apparent circumferential wall thickening in the terminal ileum and cecum at the level of the ileocecal valve. This is associated with more definite wall thickening in the rectum. Given the history of diarrhea, infectious/inflammatory enterocolitis would be a consideration. Neoplasm cannot be excluded. 2. Apparent interval increase in size of the right renal lesion evaluated by previous MRI and and found to be suspicious for possible papillary type renal cell carcinoma at that time. This is difficult to assess on today's noncontrast exam.  10-31-15: swallow study: Regular solids;Thin liquid    LABS REVIEWED:   09-14-15: hgb a1c 12.4 09-28-15: vit B12: 1841; CA 19-9: 113; CEA; 12.2;  10-19-15: tsh 1.783 11-03-15: wbc 6.7 hgb 8.9; hct 27.8; mcv 93.9; plt 171; glucose 243; bun 25; creat 1.41; k+ 4.9; na++142   Review of Systems  Unable to perform ROS: dementia     Physical Exam  Constitutional: No distress.  Thin   Eyes: Conjunctivae are normal.  Neck: Neck supple. No JVD present. No thyromegaly present.  Cardiovascular: Normal rate, regular rhythm and intact distal  pulses.   Respiratory: Effort normal and breath sounds normal. No respiratory distress. He has no wheezes.  GI: Soft. Bowel sounds are normal. He exhibits no distension. There is no tenderness.  Musculoskeletal: He exhibits no edema.  Able to move all extremities   Lymphadenopathy:    He has no cervical adenopathy.  Neurological: He is alert.  Skin: Skin is warm and dry. He is not diaphoretic.  Psychiatric: He has a normal mood and affect.       ASSESSMENT/ PLAN:  1. Dyslipidemia: will continue pravachol 20 mg daily  2. Hypothyroidism: will continue synthroid 75 mcg daily his tsh is 1.783  3. Diabetes: hgb a1c is 12.4; will continue novolog 4 units with meals with an SSI; will continue tujeo 12 units twice daily   4. Hypertension: will continue lopressor 25 mg twice daily   5. Gerd with IBS: will continue protonix 40 mg daily and take imodium 2 mg daily for loose stools  6. Seizures: no reports of seizure activity; will continue keppra 1 gm twice daily   7. Hypothyroidism: will continue synthroid 75 mcg daily   8. CAD: has 3 vessel disease: will continue plavix 75 mg daily; asa 81 mg daily  and lopressor 25 mg twice daily   9. COPD: will continue symbicort 160-4.5 mcg 2 puffs twice daily spiriva 18 mcg daily   10.chronic back pain: will continue lyrica 50 mg in the AM and 100 mg in the PM; will continue ultram 50 mg every 6 hours as needed  11. Anxiety: will continue lexapro 20 mg daily   Time spent with patient  50  minutes >50% time spent counseling; reviewing medical record; tests; labs; and developing future plan of care     Synthia Innocent NP [facility]  Contact [phone] Monday through Friday 8am- 5pm  After hours call [phone]

## 2015-11-07 ENCOUNTER — Non-Acute Institutional Stay (SKILLED_NURSING_FACILITY): Payer: Medicare Other | Admitting: Internal Medicine

## 2015-11-07 ENCOUNTER — Encounter: Payer: Self-pay | Admitting: Internal Medicine

## 2015-11-07 ENCOUNTER — Ambulatory Visit: Payer: Medicare Other | Admitting: Neurology

## 2015-11-07 DIAGNOSIS — R197 Diarrhea, unspecified: Secondary | ICD-10-CM | POA: Diagnosis not present

## 2015-11-07 DIAGNOSIS — R569 Unspecified convulsions: Secondary | ICD-10-CM | POA: Diagnosis not present

## 2015-11-07 DIAGNOSIS — F329 Major depressive disorder, single episode, unspecified: Secondary | ICD-10-CM | POA: Diagnosis not present

## 2015-11-07 DIAGNOSIS — G9340 Encephalopathy, unspecified: Secondary | ICD-10-CM

## 2015-11-07 DIAGNOSIS — F0390 Unspecified dementia without behavioral disturbance: Secondary | ICD-10-CM | POA: Diagnosis not present

## 2015-11-07 DIAGNOSIS — N2889 Other specified disorders of kidney and ureter: Secondary | ICD-10-CM | POA: Diagnosis not present

## 2015-11-07 DIAGNOSIS — J690 Pneumonitis due to inhalation of food and vomit: Secondary | ICD-10-CM | POA: Diagnosis not present

## 2015-11-07 DIAGNOSIS — E785 Hyperlipidemia, unspecified: Secondary | ICD-10-CM

## 2015-11-07 DIAGNOSIS — F32A Depression, unspecified: Secondary | ICD-10-CM | POA: Insufficient documentation

## 2015-11-07 DIAGNOSIS — E1110 Type 2 diabetes mellitus with ketoacidosis without coma: Secondary | ICD-10-CM

## 2015-11-07 DIAGNOSIS — E1310 Other specified diabetes mellitus with ketoacidosis without coma: Secondary | ICD-10-CM | POA: Diagnosis not present

## 2015-11-07 DIAGNOSIS — N182 Chronic kidney disease, stage 2 (mild): Secondary | ICD-10-CM

## 2015-11-07 NOTE — Assessment & Plan Note (Signed)
1. Aspiration pneumonia -SLP eval completed last admission, was treated with levaquin then -treated with broad spectrum Abx again since admission, completed 3days of Vanc and cefepime,  -Blood Cx-NGTD x4 days -MBS completed 2/23: mild dysphagia noted, Regular diet recommended and swallowing strategies recommended -Incentive spirometry -Antibiotics changed to PO levaquin 2/24; SNF - levaquin for 1 more day

## 2015-11-07 NOTE — Assessment & Plan Note (Addendum)
improved -immodium used PRN, reports h/o chronic diarrhea for 15years, workup unrevealing per pthas long standing diarrhea for >15years, takes immodium -Cdiff negative -per Pt workup negative, was told may have IBS -CT with mild thickening of terminal ileum/cecum, likely gastroenteritis-GI pathogen panel negative, Cdiff negative -resolved, tolerating regular diet -h/o extensive GI workup which was unrevealing

## 2015-11-07 NOTE — Assessment & Plan Note (Signed)
SNF - cont pravachol 20 mg daily

## 2015-11-07 NOTE — Assessment & Plan Note (Signed)
SNF - not reported as unstable; cont keppra

## 2015-11-07 NOTE — Assessment & Plan Note (Signed)
SNF - reported back to baseline

## 2015-11-07 NOTE — Assessment & Plan Note (Signed)
SNF - not stated as uncontrolled; cont laxapro 20 mg daily

## 2015-11-07 NOTE — Assessment & Plan Note (Signed)
needs Urology FU, seen by Dr.Grapey 3 weeks ago -interval increase in size

## 2015-11-07 NOTE — Assessment & Plan Note (Signed)
due to infection/DKA/Dementia -back to baseline now

## 2015-11-07 NOTE — Progress Notes (Signed)
MRN: [id number] Name: Benjamin Hodge  Sex: male Age: 75 y.o. DOB: 1941/02/10  PSC #: Ronni Rumble Facility/Room:101 Level Of Care: SNF Provider: Merrilee Seashore D Emergency Contacts: Extended Emergency Contact Information Primary Emergency Contact: Clemons,[name]  [place] Home Phone: [phone] Work Phone: [phone] Relation: Son Secondary Emergency Contact: Totton,Christina  [place] Home Phone: [phone] Relation: Other  Code Status:   Allergies: Ace inhibitors; Cymbalta; Lipitor; Bee venom; Glucerna; and Angiotensin receptor blockers  Chief Complaint  Patient presents with  . New Admit To SNF    HPI: Patient is 75 y.o. male whowas admitted last week for pneumonia and uncontrolled diabetes mellitus had followed up with endocrinologist when patient was found very weak and had a near syncopal episode at the office. Patient was brought to the ER. Patient is found to be hypotensive and labs reveal acute renal failure. On exam patient also had mild abdominal tenderness and patient states he has been having diarrhea chronically. Denies any nausea vomiting chest pain or shortness of breath but has been having some productive cough and chest x-ray shows persistent infiltrates. Patient's blood sugars slowly increased while in the ER and was 500 by the time of admission. Pt was admitted to [facility] from 2/20-27 where he was treated for HCAP and DKA complicated by encephalopathy. Pt is admitted to SNF for generalized weakness for OT/PT. While at SNF pt will be followed for depression, tx with lexapro, seizures, tx with keppra and hypothyroidism, tx with synthroid.  Past Medical History  Diagnosis Date  . CAD (coronary artery disease)     LAD 40-50% stenosis, first diagonal small 90% stenosis, circumflex 70% stenosis, OM 80% stenosis, right coronary artery 80-90% stenosis.  . Right upper lobe pneumonia 11/28/2008  . Anxiety 07/18/2008  . Irritable  bowel syndrome 04/27/2008  . COPD (chronic obstructive pulmonary disease) (HCC) 02/27/2008  . B12 deficiency 02/16/2008    in 5/09: B12 262, MMA 1040  . Chronic diarrhea 01/01/2008    s/p EGD 10/06: H pylori + gastritis, duodenal biopsy normal (Dr. Elnoria Howard); s/p colonoscopy 10/06: hemorrhoids (Dr. Elnoria Howard); evaluated by Dr. Yancey Flemings  in 8/09: tissue transglutaminase AB negative, VIP normal, stool fat content normal, urine 5-HIAA normal; normal GES 11/09 (done for "fluctuating sugars")  . 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 Orthostatic hypotension 02/09/2007  . Hypertension 02/09/2007  . Spinal stenosis in cervical region 05/03    MRI  . Erectile dysfunction 09/27/2006    s/p penile implant  . Anemia, iron deficiency 09/27/2006    neg colonoscopy 2006 - Dr. Elnoria Howard; ferritin 152; hgb  15.7  on 07/07  . Hypothyroidism 09/27/2006    TSH 2.639  07/07  . Hypersomnia 09/27/2006    evaluated by Dr. Jetty Duhamel (5/08) and Dr. Vickey Huger; PSG 8/09: chronic delayed sleep phase syndrome (patient sleeps during the day and is awake at night), nocturnal myoclonus (eval for RLS and IDA suggested). Pt advised to change sleeping behavior  . Diabetes mellitus type II 09/07/1984    poorly controlled, complicated by peripheral neuropathy, microalbuminuria, mild non proliferative retinopathy, s/p DKA 7/05, on insulin pump x 4/09, started a pump vacation on 11/19/2008  . Hyperlipidemia   . 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 Hemorrhoids   . Chronic kidney disease   . Unspecified hereditary and idiopathic peripheral neuropathy   . Other chronic pain   . Other malaise and fatigue   . Depression   . Hypertrophy of prostate without urinary obstruction and other lower urinary  tract symptoms (LUTS)   . Lumbago   . Heart attack (HCC) 03/2014    mild  . Renal mass, right: Incentendal finding MRI 10/02/2015 10/03/2015  . Hypogonadism in male 10/03/2015  . Asthma     Past Surgical History  Procedure  Laterality Date  . Penile prosthesis implant    . Tonsillectomy    . Colonoscopy  06/25/2005    Dr. Jeani Hawking  . Left heart catheterization with coronary angiogram [name] 03/13/2014    Procedure: LEFT HEART CATHETERIZATION WITH CORONARY ANGIOGRAM;  Surgeon: Peter M Swaziland, MD;  Location: [facility] CATH LAB;  Service: Cardiovascular;  Laterality: [name];  . Peripheral vascular catheterization [name] 02/19/2015    Procedure: Abdominal Aortogram;  Surgeon: Nada Libman, MD;  Location: [facility] INVASIVE CV LAB;  Service: Cardiovascular;  Laterality: [name];  . Peripheral vascular catheterization Left 02/19/2015    Procedure: Lower Extremity Angiography;  Surgeon: Nada Libman, MD;  Location: [facility] INVASIVE CV LAB;  Service: Cardiovascular;  Laterality: Left;      Medication List       This list is accurate as of: 11/07/15  9:58 PM.  Always use your most recent med list.               aspirin 81 MG tablet  Take 81 mg by mouth at bedtime.     budesonide-formoterol 160-4.5 MCG/ACT inhaler  Commonly known as:  SYMBICORT  Inhale 2 puffs into the lungs 2 (two) times daily.     clopidogrel 75 MG tablet  Commonly known as:  PLAVIX  TAKE 1 TABLET BY MOUTH DAILY WITH BREAKFAST     diphenoxylate-atropine 2.5-0.025 MG tablet  Commonly known as:  LOMOTIL  Take 1 tablet by mouth every 6 (six) hours as needed for diarrhea or loose stools.     escitalopram 20 MG tablet  Commonly known as:  LEXAPRO  TAKE ONE TABLET BY MOUTH ONE TIME DAILY     insulin aspart 100 UNIT/ML injection  Commonly known as:  novoLOG  Inject 4 Units into the skin 3 (three) times daily with meals.     insulin aspart 100 UNIT/ML injection  Commonly known as:  novoLOG  70-149: 0 units,  150-210 mg/dL- 1 unit, 161-096 mg/dL-2 units, 045-409 mg/dL-3 units, 811-914 mg/dL-4 units, 782-956 mg/dL-5 units and call MD     levETIRAcetam 1000 MG tablet  Commonly known as:  KEPPRA  Take 1 tablet (1,000 mg total) by mouth 2 (two) times daily. For  seizures     levofloxacin 250 MG tablet  Commonly known as:  LEVAQUIN  Take 1 tablet (250 mg total) by mouth daily. For 1day     levothyroxine 75 MCG tablet  Commonly known as:  SYNTHROID, LEVOTHROID  Take 1 tablet (75 mcg total) by mouth daily.     loperamide 2 MG capsule  Commonly known as:  IMODIUM  Take 1 capsule (2 mg total) by mouth every morning.     metoprolol tartrate 25 MG tablet  Commonly known as:  LOPRESSOR  Take One tablet by mouth twice daily to control BP     multivitamin with minerals Tabs tablet  Take 1 tablet by mouth daily.     ONE TOUCH TEST STRIPS test strip  Generic drug:  glucose blood  1 each by Other route 4 (four) times daily. Reported on 11/06/2015     pantoprazole 40 MG tablet  Commonly known as:  PROTONIX  TAKE ONE TABLET BY MOUTH ONE TIME DAILY  pravastatin 20 MG tablet  Commonly known as:  PRAVACHOL  Take 1 tablet (20 mg total) by mouth every evening. NEEDS APPOINTMENT FOR FUTURE REFILLS OR 90-DAY SUPPLY     pregabalin 50 MG capsule  Commonly known as:  LYRICA  Take one capsule by mouth in the morning and two capsules by mouth in the evening for pains.     saccharomyces boulardii 250 MG capsule  Commonly known as:  FLORASTOR  Take 1 capsule (250 mg total) by mouth every evening.     tiotropium 18 MCG inhalation capsule  Commonly known as:  SPIRIVA  Place 1 capsule (18 mcg total) into inhaler and inhale daily.     TOUJEO SOLOSTAR 300 UNIT/ML Sopn  Generic drug:  Insulin Glargine  Inject 12 Units into the skin 2 (two) times daily.     traMADol 50 MG tablet  Commonly known as:  ULTRAM  Take 1 tablet (50 mg total) by mouth every 6 (six) hours as needed.     vitamin B-12 1000 MCG tablet  Commonly known as:  CYANOCOBALAMIN  Take 1,000 mcg by mouth daily.        No orders of the defined types were placed in this encounter.    Immunization History  Administered Date(s) Administered  . Influenza Split 07/27/2011  .  Influenza,inj,Quad PF,36+ Mos 07/20/2013, 06/06/2014, 07/02/2015  . Influenza-Unspecified 06/06/2009, 07/29/2010  . Pneumococcal Conjugate-13 07/02/2015  . Pneumococcal Polysaccharide-23 07/21/2012  . Td 09/14/2006    Social History  Substance Use Topics  . Smoking status: Former Smoker    Types: Cigarettes    Quit date: 09/28/2004  . Smokeless tobacco: Never Used  . Alcohol Use: 0.0 oz/week    0 Standard drinks or equivalent per week     Comment: occ/social (moderate)    Family history is + CA, breast and lung  Review of Systems  DATA OBTAINED: from patient, nurse GENERAL:  no fevers, fatigue, appetite changes SKIN: No itching, rash or wounds EYES: No eye pain, redness, discharge EARS: No earache, tinnitus, change in hearing NOSE: No congestion, drainage or bleeding  MOUTH/THROAT: No mouth or tooth pain, No sore throat RESPIRATORY: No cough, wheezing, SOB CARDIAC: No chest pain, palpitations, lower extremity edema  GI: No abdominal pain, No N/V/D or constipation, No heartburn or reflux  GU: No dysuria, frequency or urgency, or incontinence  MUSCULOSKELETAL: No unrelieved bone/joint pain NEUROLOGIC: No headache, dizziness or focal weakness PSYCHIATRIC: No c/o anxiety or sadness   There were no vitals filed for this visit.  SpO2 Readings from Last 1 Encounters:  11/04/15 98%        Physical Exam  GENERAL APPEARANCE: Alert, conversant,  No acute distress. WM with covers pulled over his head SKIN: No diaphoresis, 2 abrasions, one on each shim, dresed with foam. HEAD: Normocephalic, atraumatic  EYES: Conjunctiva/lids clear. Pupils round, reactive. EOMs intact.  EARS: External exam WNL, canals clear. Hearing grossly normal.  NOSE: No deformity or discharge.  MOUTH/THROAT: Lips w/o lesions  RESPIRATORY: Breathing is even, unlabored. Lung sounds are clear   CARDIOVASCULAR: Heart RRR no murmurs, rubs or gallops. No peripheral edema.   GASTROINTESTINAL: Abdomen is  soft, non-tender, not distended w/ normal bowel sounds. GENITOURINARY: Bladder non tender, not distended  MUSCULOSKELETAL: No abnormal joints or musculature NEUROLOGIC:  Cranial nerves 2-12 grossly intact. Moves all extremities  PSYCHIATRIC: Mood and affect appropriate to situation, no behavioral issues  Patient Active Problem List   Diagnosis Date Noted  . Depression 11/07/2015  .  Hypotension 10/29/2015  . Slurred speech   . Hyperglycemia 10/18/2015  . Hyperglycemic crisis in diabetes mellitus (HCC) 10/18/2015  . Aspiration pneumonia (HCC) 10/18/2015  . Acute encephalopathy   . Acute hyperglycemia   . Diarrhea 10/16/2015  . Renal mass, right: Incentendal finding MRI 10/02/2015 10/03/2015  . Hypogonadism in male 10/03/2015  . Dehydration   . Hyperglycemia due to type 1 diabetes mellitus (HCC) 10/02/2015  . Encephalopathy, metabolic   . Heme positive stool 10/01/2015  . Elevated CEA 09/30/2015  . Elevated CA 19-9 level 09/30/2015  . CKD (chronic kidney disease), stage II 09/30/2015  . COPD with exacerbation (HCC) 09/27/2015  . Narcotic dependency, continuous (HCC) 09/27/2015  . AKI (acute kidney injury) (HCC) 09/27/2015  . DKA (diabetic ketoacidosis) (HCC) 09/14/2015  . Narcotic abuse   . Hyperglycemia without ketosis 09/13/2015  . DKA (diabetic ketoacidoses) (HCC) 09/06/2015  . Partial symptomatic epilepsy with complex partial seizures, not intractable, without status epilepticus (HCC) 07/16/2015  . Dementia without behavioral disturbance 07/16/2015  . Hypersomnia with long sleep time, idiopathic 07/16/2015  . Pruritus of scalp 05/04/2015  . Loose stools 05/04/2015  . Absence attack (HCC) 05/04/2015  . B12 deficiency 05/04/2015  . Seizures (HCC) 04/10/2015  . Cerebrovascular disease 04/10/2015  . PVD (peripheral vascular disease) (HCC) 02/18/2015  . Testosterone deficiency 01/02/2015  . 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 Right-sided  chest wall pain 11/27/2014  . Ecchymosis 08/29/2014  . Anemia- chronic 04/03/2014  . CAD- moderate3V disease at cath 7/15- medical Rx 03/28/2014  . Dyslipidemia 03/13/2014  . Non-STEMI (non-ST elevated myocardial infarction) (HCC) 03/10/2014  . 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 Low back pain potentially associated with spinal stenosis 02/26/2014  . ADHD, adult residual type 05/31/2013  . Elevated AST (SGOT) 01/23/2013  . SHOULDER PAIN, LEFT 03/04/2009  . ANXIETY 07/18/2008  . IRRITABLE BOWEL SYNDROME 04/27/2008  . NOCTURIA 03/13/2008  . COPD (chronic obstructive pulmonary disease) (HCC) 02/27/2008  . Mild nonproliferative diabetic retinopathy (HCC) 11/18/2007  . LEG PAIN, RIGHT 09/29/2007  . POLYNEUROPATHY IN DIABETES 02/09/2007  . HYPOTENSION, ORTHOSTATIC 02/09/2007  . Hypothyroidism 09/27/2006  . Type 1 diabetes mellitus with neurological manifestations, uncontrolled (HCC) 09/27/2006  . ERECTILE DYSFUNCTION 09/27/2006  . Essential hypertension 09/27/2006  . SPINAL STENOSIS, CERVICAL 09/27/2006  . Hypersomnia 09/27/2006    CBC    Component Value Date/Time   WBC 6.7 11/03/2015 0640   WBC 5.0 06/28/2015 1621   WBC 5.3 11/29/2013 1507   RBC 2.96* 11/03/2015 0640   RBC 3.51* 06/28/2015 1621   RBC 3.37* 11/29/2013 1507   RBC 3.02* 11/26/2008 0727   HGB 8.9* 11/03/2015 0640   HGB 11.2* 11/29/2013 1507   HCT 27.8* 11/03/2015 0640   HCT 33.8* 06/28/2015 1621   HCT 34.4* 11/29/2013 1507   PLT 171 11/03/2015 0640   PLT 231 06/28/2015 1621   PLT 215 11/29/2013 1507   MCV 93.9 11/03/2015 0640   MCV 96 06/28/2015 1621   MCV 102.1* 11/29/2013 1507   LYMPHSABS 1.6 10/28/2015 1440   LYMPHSABS 1.3 06/28/2015 1621   LYMPHSABS 0.9 11/29/2013 1507   MONOABS 1.2* 10/28/2015 1440   MONOABS 0.5 11/29/2013 1507   EOSABS 0.1 10/28/2015 1440   EOSABS 0.2 06/28/2015 1621   EOSABS 0.3 11/29/2013 1507   BASOSABS 0.0 10/28/2015 1440    BASOSABS 0.0 06/28/2015 1621   BASOSABS 0.0 11/29/2013 1507    CMP     Component  Value Date/Time   NA 142 11/03/2015 0640   NA 138 06/28/2015 1621   NA 143 11/29/2013 1507   K 4.9 11/03/2015 0640   K 5.0 11/29/2013 1507   CL 103 11/03/2015 0640   CL 108* 11/07/2012 1507   CO2 28 11/03/2015 0640   CO2 26 11/29/2013 1507   GLUCOSE 243* 11/03/2015 0640   GLUCOSE 232* 06/28/2015 1621   GLUCOSE 156* 11/29/2013 1507   GLUCOSE 256* 11/07/2012 1507   BUN 25* 11/03/2015 0640   BUN 31* 06/28/2015 1621   BUN 20.9 11/29/2013 1507   CREATININE 1.41* 11/03/2015 0640   CREATININE 1.1 11/29/2013 1507   CALCIUM 9.2 11/03/2015 0640   CALCIUM 9.3 11/29/2013 1507   PROT 5.6* 10/29/2015 0721   PROT 6.6 06/28/2015 1621   PROT 6.6 11/29/2013 1507   ALBUMIN 3.0* 10/29/2015 0721   ALBUMIN 4.1 06/28/2015 1621   ALBUMIN 3.9 11/29/2013 1507   AST 19 10/29/2015 0721   AST 22 11/29/2013 1507   ALT 20 10/29/2015 0721   ALT 20 11/29/2013 1507   ALKPHOS 82 10/29/2015 0721   ALKPHOS 65 11/29/2013 1507   BILITOT 1.2 10/29/2015 0721   BILITOT 0.3 06/28/2015 1621   BILITOT 0.46 11/29/2013 1507   GFRNONAA 48* 11/03/2015 0640   GFRAA 56* 11/03/2015 0640    Lab Results  Component Value Date   HGBA1C 12.4* 09/14/2015     Ct Abdomen Pelvis Wo Contrast  10/28/2015  CLINICAL DATA:  Generalized weakness and diarrhea for 4-5 weeks. EXAM: CT ABDOMEN AND PELVIS WITHOUT CONTRAST TECHNIQUE: Multidetector CT imaging of the abdomen and pelvis was performed following the standard protocol without IV contrast. COMPARISON:  Abdominal MRI from 10/02/2015 FINDINGS: Lower chest: Emphysema with underlying chronic interstitial coarsening and basilar atelectasis. Hepatobiliary: No focal abnormality in the liver on this study without intravenous contrast. No evidence of hepatomegaly. There is no evidence for gallstones, gallbladder wall thickening, or pericholecystic fluid. No intrahepatic or extrahepatic biliary dilation.  Pancreas: No focal mass lesion. No dilatation of the main duct. No intraparenchymal cyst. No peripancreatic edema. Spleen: No splenomegaly. No focal mass lesion. Adrenals/Urinary Tract: No adrenal nodule or mass. Atherosclerotic calcification is seen and arterial anatomy of the right renal hilum. 3 mm nonobstructing stone is seen in the lower pole of the right kidney. 11 mm water density lesion in the interpolar right kidney is compatible with a cyst as was seen on the previous MRI. 16 mm mildly enhancing right renal lesions seen on previous MRI is difficult to see on today's uninfused CT scan, but appears to measure about 2.2 cm Left kidney is unremarkable on this uninfused exam. No evidence for hydronephrosis. No evidence for hydroureter. Mild bladder wall thickening is evident. Stomach/Bowel: Stomach is nondistended, likely accounting for the apparent wall thickening. Duodenum diverticulum noted. No small bowel dilatation. Small bowel is not well distended which makes small bowel wall thickness difficult to assess, but despite this limitation, there does appear to be some true circumferential wall thickening in the terminal ileum. There may also be some abnormal wall thickening in the cecum, at the level of the ileocecal valve (see image 51 of series 2). The appendix is normal. Abnormal wall thickening is present in the rectum (see images 65 -72 of series 2). Vascular/Lymphatic: There is abdominal aortic atherosclerosis without aneurysm. There is no gastrohepatic or hepatoduodenal ligament lymphadenopathy. No intraperitoneal or retroperitoneal lymphadenopathy. No pelvic sidewall lymphadenopathy. Reproductive: Prostate gland is unremarkable. Penile prosthesis is evident. Other: No intraperitoneal free fluid.  Musculoskeletal: Bone windows reveal no worrisome lytic or sclerotic osseous lesions. IMPRESSION: 1. Apparent circumferential wall thickening in the terminal ileum and cecum at the level of the ileocecal  valve. This is associated with more definite wall thickening in the rectum. Given the history of diarrhea, infectious/inflammatory enterocolitis would be a consideration. Neoplasm cannot be excluded. 2. Apparent interval increase in size of the right renal lesion evaluated by previous MRI and and found to be suspicious for possible papillary type renal cell carcinoma at that time. This is difficult to assess on today's noncontrast exam. Electronically Signed   By: Kennith Center M.D.   On: 10/28/2015 20:15   Dg Chest 1 View  10/28/2015  CLINICAL DATA:  Altered mental status.  Lethargy.  Cough. EXAM: CHEST 1 VIEW COMPARISON:  10/18/2015.  09/27/2015. FINDINGS: Heart size is normal with some left ventricular prominence. There is calcification of the aorta. There is chronic elevation of left hemidiaphragm. There is increased density at the left base when compared to previous studies consistent with left lower lobe pneumonia. No lobar collapse. No effusions. IMPRESSION: Chronically elevated left hemidiaphragm. Newly seen left base infiltrate consistent with bronchopneumonia. Electronically Signed   By: Paulina Fusi M.D.   On: 10/28/2015 15:50   Ct Head Wo Contrast  10/28/2015  CLINICAL DATA:  Generalized weakness and altered mental status. Pt has no complaints of pain or injury Hx: HTN, Diabetes, MI, Chronic fatigue EXAM: CT HEAD WITHOUT CONTRAST TECHNIQUE: Contiguous axial images were obtained from the base of the skull through the vertex without intravenous contrast. COMPARISON:  10/18/2015 FINDINGS: Brain: Mild atrophy. No evidence of acute infarction, hemorrhage, extra-axial collection, ventriculomegaly, or mass effect. Vascular: No hyperdense vessel or unexpected calcification. Atherosclerotic and physiologic intracranial calcifications. Skull: Negative for fracture or focal lesion. Sinuses/Orbits: No acute findings. Other: None. IMPRESSION: 1. Negative for bleed or other acute intracranial process.  Electronically Signed   By: Corlis Leak M.D.   On: 10/28/2015 16:16    Not all labs, radiology exams or other studies done during hospitalization come through on my EPIC note; however they are reviewed by me.    Assessment and Plan  Aspiration pneumonia (HCC) 1. Aspiration pneumonia -SLP eval completed last admission, was treated with levaquin then -treated with broad spectrum Abx again since admission, completed 3days of Vanc and cefepime,  -Blood Cx-NGTD x4 days -MBS completed 2/23: mild dysphagia noted, Regular diet recommended and swallowing strategies recommended -Incentive spirometry -Antibiotics changed to PO levaquin 2/24; SNF - levaquin for 1 more day  Diarrhea improved -immodium used PRN, reports h/o chronic diarrhea for 15years, workup unrevealing per pthas long standing diarrhea for >15years, takes immodium -Cdiff negative -per Pt workup negative, was told may have IBS -CT with mild thickening of terminal ileum/cecum, likely gastroenteritis-GI pathogen panel negative, Cdiff negative -resolved, tolerating regular diet -h/o extensive GI workup which was unrevealing   DKA (diabetic ketoacidosis) (HCC) -treated with IVF, insulin gtt on admission -then transitioned to Lantus, SSI and meal coverage -after discussion with DM coordinator, used a customized SSI, unfortunately PO intake a little erratic, was eating breakfast twice in hospital on more than 1 occasion which caused his CBGs to spike. -had long discussions with pt on importance of 3 meals and moderation of PO intake SNF - cont designated insulin regimen; FU with Dr.Kerr after discharge   Acute encephalopathy due to infection/DKA/Dementia -back to baseline now  Dementia without behavioral disturbance SNF - reported back to baseline  Renal mass, right: Incentendal finding MRI  10/02/2015 needs Urology FU, seen by Dr.Grapey 3 weeks ago -interval increase in size  Seizures (HCC) SNF - not reported as unstable;  cont keppra  Depression SNF - not stated as uncontrolled; cont laxapro 20 mg daily  CKD (chronic kidney disease), stage II SNF improved to baseline with IVF; will f/u BMP  Dyslipidemia SNF - cont pravachol 20 mg daily   Time spent > 45 min;> 50% of time with patient was spent reviewing records, labs, tests and studies, counseling and developing plan of care  Margit Hanks, MD

## 2015-11-07 NOTE — Assessment & Plan Note (Signed)
SNF improved to baseline with IVF; will f/u BMP

## 2015-11-07 NOTE — Assessment & Plan Note (Signed)
-  treated with IVF, insulin gtt on admission -then transitioned to Lantus, SSI and meal coverage -after discussion with DM coordinator, used a customized SSI, unfortunately PO intake a little erratic, was eating breakfast twice in hospital on more than 1 occasion which caused his CBGs to spike. -had long discussions with pt on importance of 3 meals and moderation of PO intake SNF - cont designated insulin regimen; FU with Dr.Kerr after discharge

## 2015-11-11 ENCOUNTER — Encounter: Payer: Self-pay | Admitting: Internal Medicine

## 2015-11-11 ENCOUNTER — Non-Acute Institutional Stay (SKILLED_NURSING_FACILITY): Payer: Medicare Other | Admitting: Internal Medicine

## 2015-11-11 DIAGNOSIS — IMO0002 Reserved for concepts with insufficient information to code with codable children: Secondary | ICD-10-CM

## 2015-11-11 DIAGNOSIS — E1065 Type 1 diabetes mellitus with hyperglycemia: Secondary | ICD-10-CM

## 2015-11-11 DIAGNOSIS — E1049 Type 1 diabetes mellitus with other diabetic neurological complication: Secondary | ICD-10-CM

## 2015-11-11 NOTE — Assessment & Plan Note (Signed)
Pt is on toujeo 12 U BID with  FBS from 71-378 so no room for change there. I have made toujeo 24 units daily. For meals pt was getting  4 units + what looks like an ultra sensitive SSI with meals. That was changed to 5 u for any BS > 150. Pt appears to need a meal SSI but will use the sensitive SSI used at the hospital which is more robust than the one pt came with. Pt reported to be eating very well;will monitor BS  And change further as needed.

## 2015-11-11 NOTE — Progress Notes (Signed)
MRN: [id number] Name: Benjamin Hodge  Sex: male Age: 75 y.o. DOB: 1940/09/15  PSC #: Ronni Rumble Facility/Room:101 Level Of Care: SNF Provider: Merrilee Seashore D Emergency Contacts: Extended Emergency Contact Information Primary Emergency Contact: Wesch,[name]  [place] Home Phone: [phone] Work Phone: [phone] Relation: Son Secondary Emergency Contact: Askin,Christina  [place] Home Phone: [phone] Relation: Other  Code Status:   Allergies: Ace inhibitors; Cymbalta; Lipitor; Bee venom; Glucerna; and Angiotensin receptor blockers  Chief Complaint  Patient presents with  . Acute Visit    HPI: Patient is 75 y.o. male who was admitted to [facility] from 2/20-27 where he was treated for HCAP and DKA complicated by encephalopathy. Pt was admitted to SNF for generalized weakness for OT/PT. I have been asked to see pt because since admission to SNF pt's BS have been running from 71 to 500, most in the high 300's to mid 400's. Pt says he can tell when his sugars are running high. Nursing have noted no objective changes in pt, fever, cough, frequency.  Past Medical History  Diagnosis Date  . CAD (coronary artery disease)     LAD 40-50% stenosis, first diagonal small 90% stenosis, circumflex 70% stenosis, OM 80% stenosis, right coronary artery 80-90% stenosis.  . Right upper lobe pneumonia 11/28/2008  . Anxiety 07/18/2008  . Irritable bowel syndrome 04/27/2008  . COPD (chronic obstructive pulmonary disease) (HCC) 02/27/2008  . B12 deficiency 02/16/2008    in 5/09: B12 262, MMA 1040  . Chronic diarrhea 01/01/2008    s/p EGD 10/06: H pylori + gastritis, duodenal biopsy normal (Dr. Elnoria Howard); s/p colonoscopy 10/06: hemorrhoids (Dr. Elnoria Howard); evaluated by Dr. Yancey Flemings  in 8/09: tissue transglutaminase AB negative, VIP normal, stool fat content normal, urine 5-HIAA normal; normal GES 11/09 (done for "fluctuating sugars")  . Mild nonproliferative  diabetic retinopathy(362.04) 11/18/2007  . Orthostatic hypotension 02/09/2007  . Hypertension 02/09/2007  . Spinal stenosis in cervical region 05/03    MRI  . Erectile dysfunction 09/27/2006    s/p penile implant  . Anemia, iron deficiency 09/27/2006    neg colonoscopy 2006 - Dr. Elnoria Howard; ferritin 152; hgb  15.7  on 07/07  . Hypothyroidism 09/27/2006    TSH 2.639  07/07  . Hypersomnia 09/27/2006    evaluated by Dr. Jetty Duhamel (5/08) and Dr. Vickey Huger; PSG 8/09: chronic delayed sleep phase syndrome (patient sleeps during the day and is awake at night), nocturnal myoclonus (eval for RLS and IDA suggested). Pt advised to change sleeping behavior  . Diabetes mellitus type II 09/07/1984    poorly controlled, complicated by peripheral neuropathy, microalbuminuria, mild non proliferative retinopathy, s/p DKA 7/05, on insulin pump x 4/09, started a pump vacation on 11/19/2008  . Hyperlipidemia   . 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 Hemorrhoids   . Chronic kidney disease   . Unspecified hereditary and idiopathic peripheral neuropathy   . Other chronic pain   . Other malaise and fatigue   . Depression   . Hypertrophy of prostate without urinary obstruction and other lower urinary tract symptoms (LUTS)   . Lumbago   . Heart attack (HCC) 03/2014    mild  . Renal mass, right: Incentendal finding MRI 10/02/2015 10/03/2015  . Hypogonadism in male 10/03/2015  . Asthma     Past Surgical History  Procedure Laterality Date  . Penile prosthesis implant    . Tonsillectomy    . Colonoscopy  06/25/2005    Dr. Jeani Hawking  . Left heart catheterization with  coronary angiogram [name] 03/13/2014    Procedure: LEFT HEART CATHETERIZATION WITH CORONARY ANGIOGRAM;  Surgeon: Peter M Swaziland, MD;  Location: [facility] CATH LAB;  Service: Cardiovascular;  Laterality: [name];  . Peripheral vascular catheterization [name] 02/19/2015    Procedure: Abdominal Aortogram;  Surgeon: Nada Libman, MD;  Location: [facility] INVASIVE CV LAB;  Service:  Cardiovascular;  Laterality: [name];  . Peripheral vascular catheterization Left 02/19/2015    Procedure: Lower Extremity Angiography;  Surgeon: Nada Libman, MD;  Location: [facility] INVASIVE CV LAB;  Service: Cardiovascular;  Laterality: Left;      Medication List       This list is accurate as of: 11/11/15  8:12 PM.  Always use your most recent med list.               aspirin 81 MG tablet  Take 81 mg by mouth at bedtime.     budesonide-formoterol 160-4.5 MCG/ACT inhaler  Commonly known as:  SYMBICORT  Inhale 2 puffs into the lungs 2 (two) times daily.     clopidogrel 75 MG tablet  Commonly known as:  PLAVIX  TAKE 1 TABLET BY MOUTH DAILY WITH BREAKFAST     diphenoxylate-atropine 2.5-0.025 MG tablet  Commonly known as:  LOMOTIL  Take 1 tablet by mouth every 6 (six) hours as needed for diarrhea or loose stools.     escitalopram 20 MG tablet  Commonly known as:  LEXAPRO  TAKE ONE TABLET BY MOUTH ONE TIME DAILY     insulin aspart 100 UNIT/ML injection  Commonly known as:  novoLOG  Inject 4 Units into the skin 3 (three) times daily with meals.     insulin aspart 100 UNIT/ML injection  Commonly known as:  novoLOG  70-149: 0 units,  150-210 mg/dL- 1 unit, 161-096 mg/dL-2 units, 045-409 mg/dL-3 units, 811-914 mg/dL-4 units, 782-956 mg/dL-5 units and call MD     levETIRAcetam 1000 MG tablet  Commonly known as:  KEPPRA  Take 1 tablet (1,000 mg total) by mouth 2 (two) times daily. For seizures     levofloxacin 250 MG tablet  Commonly known as:  LEVAQUIN  Take 1 tablet (250 mg total) by mouth daily. For 1day     levothyroxine 75 MCG tablet  Commonly known as:  SYNTHROID, LEVOTHROID  Take 1 tablet (75 mcg total) by mouth daily.     loperamide 2 MG capsule  Commonly known as:  IMODIUM  Take 1 capsule (2 mg total) by mouth every morning.     metoprolol tartrate 25 MG tablet  Commonly known as:  LOPRESSOR  Take One tablet by mouth twice daily to control BP     multivitamin with  minerals Tabs tablet  Take 1 tablet by mouth daily.     ONE TOUCH TEST STRIPS test strip  Generic drug:  glucose blood  1 each by Other route 4 (four) times daily. Reported on 11/06/2015     pantoprazole 40 MG tablet  Commonly known as:  PROTONIX  TAKE ONE TABLET BY MOUTH ONE TIME DAILY     pravastatin 20 MG tablet  Commonly known as:  PRAVACHOL  Take 1 tablet (20 mg total) by mouth every evening. NEEDS APPOINTMENT FOR FUTURE REFILLS OR 90-DAY SUPPLY     pregabalin 50 MG capsule  Commonly known as:  LYRICA  Take one capsule by mouth in the morning and two capsules by mouth in the evening for pains.     saccharomyces boulardii 250 MG capsule  Commonly known as:  FLORASTOR  Take 1 capsule (250 mg total) by mouth every evening.     tiotropium 18 MCG inhalation capsule  Commonly known as:  SPIRIVA  Place 1 capsule (18 mcg total) into inhaler and inhale daily.     TOUJEO SOLOSTAR 300 UNIT/ML Sopn  Generic drug:  Insulin Glargine  Inject 12 Units into the skin 2 (two) times daily.     traMADol 50 MG tablet  Commonly known as:  ULTRAM  Take 1 tablet (50 mg total) by mouth every 6 (six) hours as needed.     vitamin B-12 1000 MCG tablet  Commonly known as:  CYANOCOBALAMIN  Take 1,000 mcg by mouth daily.        No orders of the defined types were placed in this encounter.    Immunization History  Administered Date(s) Administered  . Influenza Split 07/27/2011  . Influenza,inj,Quad PF,36+ Mos 07/20/2013, 06/06/2014, 07/02/2015  . Influenza-Unspecified 06/06/2009, 07/29/2010  . Pneumococcal Conjugate-13 07/02/2015  . Pneumococcal Polysaccharide-23 07/21/2012  . Td 09/14/2006    Social History  Substance Use Topics  . Smoking status: Former Smoker    Types: Cigarettes    Quit date: 09/28/2004  . Smokeless tobacco: Never Used  . Alcohol Use: 0.0 oz/week    0 Standard drinks or equivalent per week     Comment: occ/social (moderate)    Review of Systems  DATA  OBTAINED: from patient, nurse - hig BS GENERAL:  no fevers, fatigue, appetite changes SKIN: No itching, rash HEENT: No complaint RESPIRATORY: No cough, wheezing, SOB CARDIAC: No chest pain, palpitations, lower extremity edema  GI: No abdominal pain, No N/V/D or constipation, No heartburn or reflux  GU: No dysuria, frequency or urgency, or incontinence  MUSCULOSKELETAL: No unrelieved bone/joint pain NEUROLOGIC: No headache, dizziness  PSYCHIATRIC: No overt anxiety or sadness  Filed Vitals:   11/11/15 1939  BP: 160/98  Pulse: 81  Temp: 96.8 F (36 C)  Resp: 20    Physical Exam  GENERAL APPEARANCE: Alert, conversant, No acute distress  SKIN: No diaphoresis rash HEENT: Unremarkable RESPIRATORY: Breathing is even, unlabored. Lung sounds are clear   CARDIOVASCULAR: Heart RRR no murmurs, rubs or gallops. No peripheral edema  GASTROINTESTINAL: Abdomen is soft, non-tender, not distended w/ normal bowel sounds.  GENITOURINARY: Bladder non tender, not distended  MUSCULOSKELETAL: No abnormal joints or musculature NEUROLOGIC: Cranial nerves 2-12 grossly intact. Moves all extremities PSYCHIATRIC: Mood and affect appropriate to situation, no behavioral issues  Patient Active Problem List   Diagnosis Date Noted  . Depression 11/07/2015  . Hypotension 10/29/2015  . Slurred speech   . Hyperglycemia 10/18/2015  . Hyperglycemic crisis in diabetes mellitus (HCC) 10/18/2015  . Aspiration pneumonia (HCC) 10/18/2015  . Acute encephalopathy   . Acute hyperglycemia   . Diarrhea 10/16/2015  . Renal mass, right: Incentendal finding MRI 10/02/2015 10/03/2015  . Hypogonadism in male 10/03/2015  . Dehydration   . Hyperglycemia due to type 1 diabetes mellitus (HCC) 10/02/2015  . Encephalopathy, metabolic   . Heme positive stool 10/01/2015  . Elevated CEA 09/30/2015  . Elevated CA 19-9 level 09/30/2015  . CKD (chronic kidney disease), stage II 09/30/2015  . COPD with exacerbation (HCC) 09/27/2015   . Narcotic dependency, continuous (HCC) 09/27/2015  . AKI (acute kidney injury) (HCC) 09/27/2015  . DKA (diabetic ketoacidosis) (HCC) 09/14/2015  . Narcotic abuse   . Hyperglycemia without ketosis 09/13/2015  . DKA (diabetic ketoacidoses) (HCC) 09/06/2015  . Partial symptomatic epilepsy with complex partial seizures, not intractable, without  status epilepticus (HCC) 07/16/2015  . Dementia without behavioral disturbance 07/16/2015  . Hypersomnia with long sleep time, idiopathic 07/16/2015  . Pruritus of scalp 05/04/2015  . Loose stools 05/04/2015  . Absence attack (HCC) 05/04/2015  . B12 deficiency 05/04/2015  . Seizures (HCC) 04/10/2015  . Cerebrovascular disease 04/10/2015  . PVD (peripheral vascular disease) (HCC) 02/18/2015  . Testosterone deficiency 01/02/2015  . 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 Right-sided chest wall pain 11/27/2014  . Ecchymosis 08/29/2014  . Anemia- chronic 04/03/2014  . CAD- moderate3V disease at cath 7/15- medical Rx 03/28/2014  . Dyslipidemia 03/13/2014  . Non-STEMI (non-ST elevated myocardial infarction) (HCC) 03/10/2014  . 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 Low back pain potentially associated with spinal stenosis 02/26/2014  . ADHD, adult residual type 05/31/2013  . Elevated AST (SGOT) 01/23/2013  . SHOULDER PAIN, LEFT 03/04/2009  . ANXIETY 07/18/2008  . IRRITABLE BOWEL SYNDROME 04/27/2008  . NOCTURIA 03/13/2008  . COPD (chronic obstructive pulmonary disease) (HCC) 02/27/2008  . Mild nonproliferative diabetic retinopathy (HCC) 11/18/2007  . LEG PAIN, RIGHT 09/29/2007  . POLYNEUROPATHY IN DIABETES 02/09/2007  . HYPOTENSION, ORTHOSTATIC 02/09/2007  . Hypothyroidism 09/27/2006  . Type 1 diabetes mellitus with neurological manifestations, uncontrolled (HCC) 09/27/2006  . ERECTILE DYSFUNCTION 09/27/2006  . Essential hypertension 09/27/2006  . SPINAL STENOSIS,  CERVICAL 09/27/2006  . Hypersomnia 09/27/2006    CBC    Component Value Date/Time   WBC 6.7 11/03/2015 0640   WBC 5.0 06/28/2015 1621   WBC 5.3 11/29/2013 1507   RBC 2.96* 11/03/2015 0640   RBC 3.51* 06/28/2015 1621   RBC 3.37* 11/29/2013 1507   RBC 3.02* 11/26/2008 0727   HGB 8.9* 11/03/2015 0640   HGB 11.2* 11/29/2013 1507   HCT 27.8* 11/03/2015 0640   HCT 33.8* 06/28/2015 1621   HCT 34.4* 11/29/2013 1507   PLT 171 11/03/2015 0640   PLT 231 06/28/2015 1621   PLT 215 11/29/2013 1507   MCV 93.9 11/03/2015 0640   MCV 96 06/28/2015 1621   MCV 102.1* 11/29/2013 1507   LYMPHSABS 1.6 10/28/2015 1440   LYMPHSABS 1.3 06/28/2015 1621   LYMPHSABS 0.9 11/29/2013 1507   MONOABS 1.2* 10/28/2015 1440   MONOABS 0.5 11/29/2013 1507   EOSABS 0.1 10/28/2015 1440   EOSABS 0.2 06/28/2015 1621   EOSABS 0.3 11/29/2013 1507   BASOSABS 0.0 10/28/2015 1440   BASOSABS 0.0 06/28/2015 1621   BASOSABS 0.0 11/29/2013 1507    CMP     Component Value Date/Time   NA 142 11/03/2015 0640   NA 138 06/28/2015 1621   NA 143 11/29/2013 1507   K 4.9 11/03/2015 0640   K 5.0 11/29/2013 1507   CL 103 11/03/2015 0640   CL 108* 11/07/2012 1507   CO2 28 11/03/2015 0640   CO2 26 11/29/2013 1507   GLUCOSE 243* 11/03/2015 0640   GLUCOSE 232* 06/28/2015 1621   GLUCOSE 156* 11/29/2013 1507   GLUCOSE 256* 11/07/2012 1507   BUN 25* 11/03/2015 0640   BUN 31* 06/28/2015 1621   BUN 20.9 11/29/2013 1507   CREATININE 1.41* 11/03/2015 0640   CREATININE 1.1 11/29/2013 1507   CALCIUM 9.2 11/03/2015 0640   CALCIUM 9.3 11/29/2013 1507   PROT 5.6* 10/29/2015 0721   PROT 6.6 06/28/2015 1621   PROT 6.6 11/29/2013 1507   ALBUMIN 3.0* 10/29/2015 0721   ALBUMIN 4.1 06/28/2015 1621   ALBUMIN 3.9 11/29/2013 1507   AST 19 10/29/2015  0721   AST 22 11/29/2013 1507   ALT 20 10/29/2015 0721   ALT 20 11/29/2013 1507   ALKPHOS 82 10/29/2015 0721   ALKPHOS 65 11/29/2013 1507   BILITOT 1.2 10/29/2015 0721   BILITOT 0.3  06/28/2015 1621   BILITOT 0.46 11/29/2013 1507   GFRNONAA 48* 11/03/2015 0640   GFRAA 56* 11/03/2015 0640    Assessment and Plan  Type 1 diabetes mellitus with neurological manifestations, uncontrolled (HCC) Pt is on toujeo 12 U BID with  FBS from 71-378 so no room for change there. I have made toujeo 24 units daily. For meals pt was getting  4 units + what looks like an ultra sensitive SSI with meals. That was changed to 5 u for any BS > 150. Pt appears to need a meal SSI but will use the sensitive SSI used at the hospital which is more robust than the one pt came with. Pt reported to be eating very well;will monitor BS  And change further as needed.    Margit Hanks, MD

## 2015-11-18 ENCOUNTER — Non-Acute Institutional Stay (SKILLED_NURSING_FACILITY): Payer: Medicare Other | Admitting: Internal Medicine

## 2015-11-18 ENCOUNTER — Encounter: Payer: Self-pay | Admitting: Internal Medicine

## 2015-11-18 DIAGNOSIS — E1065 Type 1 diabetes mellitus with hyperglycemia: Secondary | ICD-10-CM

## 2015-11-18 DIAGNOSIS — L299 Pruritus, unspecified: Secondary | ICD-10-CM | POA: Diagnosis not present

## 2015-11-18 DIAGNOSIS — E1049 Type 1 diabetes mellitus with other diabetic neurological complication: Secondary | ICD-10-CM

## 2015-11-18 DIAGNOSIS — IMO0002 Reserved for concepts with insufficient information to code with codable children: Secondary | ICD-10-CM

## 2015-11-18 DIAGNOSIS — R69 Illness, unspecified: Secondary | ICD-10-CM | POA: Insufficient documentation

## 2015-11-18 NOTE — Assessment & Plan Note (Addendum)
I have reviewed BS since 2/27. I see no appreciable change since I inceased his SSI on 3/6. I can't increase long acting-his fasting am BS runs from 74 to 570. I wonder if he is getting SSI every time, his BS throughout the day and from day to day are all over the map. I increased his SSI again today from sensitive to moderate; Will follow.

## 2015-11-18 NOTE — Progress Notes (Signed)
Patient ID: Benjamin Hodge, male   DOB: April 27, 1941, 75 y.o.   MRN: [id number] MRN: [id number] Name: Benjamin Hodge  Sex: male Age: 75 y.o. DOB: 1941/04/05  [facility] #: [id number] Facility/Room:101 Level Of Care: SNF Provider: Merrilee Seashore Emergency Contacts: Extended Emergency Contact Information Primary Emergency Contact: Pfahler,[name]  [place] Home Phone: [phone] Work Phone: [phone] Relation: Son Secondary Emergency Contact: Huffstetler,Christina  [place] Home Phone: [phone] Relation: Other  Code Status:   Allergies: Ace inhibitors; Cymbalta; Lipitor; Bee venom; Glucerna; and Angiotensin receptor blockers  Chief Complaint  Patient presents with  . Acute Visit    HPI: Patient is 75 y.o. male who nursing asked me to see today because he is coughing maybe 3 days and has a MS change. Today he has been lethargic and sleeping a lot. No fever, ZVS reported stable. His blood sugars are running high even though his SSI was increased a week ago.  Past Medical History  Diagnosis Date  . CAD (coronary artery disease)     LAD 40-50% stenosis, first diagonal small 90% stenosis, circumflex 70% stenosis, OM 80% stenosis, right coronary artery 80-90% stenosis.  . Right upper lobe pneumonia 11/28/2008  . Anxiety 07/18/2008  . Irritable bowel syndrome 04/27/2008  . COPD (chronic obstructive pulmonary disease) (HCC) 02/27/2008  . B12 deficiency 02/16/2008    in 5/09: B12 262, MMA 1040  . Chronic diarrhea 01/01/2008    s/p EGD 10/06: H pylori + gastritis, duodenal biopsy normal (Dr. Elnoria Howard); s/p colonoscopy 10/06: hemorrhoids (Dr. Elnoria Howard); evaluated by Dr. Yancey Flemings  in 8/09: tissue transglutaminase AB negative, VIP normal, stool fat content normal, urine 5-HIAA normal; normal GES 11/09 (done for "fluctuating sugars")  . 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 Orthostatic hypotension 02/09/2007  . Hypertension  02/09/2007  . Spinal stenosis in cervical region 05/03    MRI  . Erectile dysfunction 09/27/2006    s/p penile implant  . Anemia, iron deficiency 09/27/2006    neg colonoscopy 2006 - Dr. Elnoria Howard; ferritin 152; hgb  15.7  on 07/07  . Hypothyroidism 09/27/2006    TSH 2.639  07/07  . Hypersomnia 09/27/2006    evaluated by Dr. Jetty Duhamel (5/08) and Dr. Vickey Huger; PSG 8/09: chronic delayed sleep phase syndrome (patient sleeps during the day and is awake at night), nocturnal myoclonus (eval for RLS and IDA suggested). Pt advised to change sleeping behavior  . Diabetes mellitus type II 09/07/1984    poorly controlled, complicated by peripheral neuropathy, microalbuminuria, mild non proliferative retinopathy, s/p DKA 7/05, on insulin pump x 4/09, started a pump vacation on 11/19/2008  . Hyperlipidemia   . 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 Hemorrhoids   . Chronic kidney disease   . Unspecified hereditary and idiopathic peripheral neuropathy   . Other chronic pain   . Other malaise and fatigue   . Depression   . Hypertrophy of prostate without urinary obstruction and other lower urinary tract symptoms (LUTS)   . Lumbago   . Heart attack (HCC) 03/2014    mild  . Renal mass, right: Incentendal finding MRI 10/02/2015 10/03/2015  . Hypogonadism in male 10/03/2015  . Asthma     Past Surgical History  Procedure Laterality Date  . Penile prosthesis implant    . Tonsillectomy    . Colonoscopy  06/25/2005    Dr. Jeani Hawking  . Left heart catheterization with coronary angiogram [name] 03/13/2014    Procedure: LEFT HEART CATHETERIZATION WITH CORONARY ANGIOGRAM;  Surgeon:  Peter M Swaziland, MD;  Location: [facility] CATH LAB;  Service: Cardiovascular;  Laterality: [name];  . Peripheral vascular catheterization [name] 02/19/2015    Procedure: Abdominal Aortogram;  Surgeon: Nada Libman, MD;  Location: [facility] INVASIVE CV LAB;  Service: Cardiovascular;  Laterality: [name];  . Peripheral vascular catheterization Left 02/19/2015     Procedure: Lower Extremity Angiography;  Surgeon: Nada Libman, MD;  Location: [facility] INVASIVE CV LAB;  Service: Cardiovascular;  Laterality: Left;      Medication List       This list is accurate as of: 11/18/15  8:39 PM.  Always use your most recent med list.               aspirin 81 MG tablet  Take 81 mg by mouth at bedtime.     budesonide-formoterol 160-4.5 MCG/ACT inhaler  Commonly known as:  SYMBICORT  Inhale 2 puffs into the lungs 2 (two) times daily.     clopidogrel 75 MG tablet  Commonly known as:  PLAVIX  TAKE 1 TABLET BY MOUTH DAILY WITH BREAKFAST     diphenoxylate-atropine 2.5-0.025 MG tablet  Commonly known as:  LOMOTIL  Take 1 tablet by mouth every 6 (six) hours as needed for diarrhea or loose stools.     escitalopram 20 MG tablet  Commonly known as:  LEXAPRO  TAKE ONE TABLET BY MOUTH ONE TIME DAILY     insulin aspart 100 UNIT/ML injection  Commonly known as:  novoLOG  CBG 151-200= 2 units; 201- 250 = 3 units; 251-300 = 5 units; 301- 350 =7 units; 351- 400= 9 units; 401- 450 = 11 units. CBG > 450, Call MD.     insulin aspart 100 UNIT/ML injection  Commonly known as:  novoLOG  Inject 4 Units into the skin 3 (three) times daily with meals.     levETIRAcetam 1000 MG tablet  Commonly known as:  KEPPRA  Take 1 tablet (1,000 mg total) by mouth 2 (two) times daily. For seizures     levofloxacin 250 MG tablet  Commonly known as:  LEVAQUIN  Take 1 tablet (250 mg total) by mouth daily. For 1day     levothyroxine 75 MCG tablet  Commonly known as:  SYNTHROID, LEVOTHROID  Take 1 tablet (75 mcg total) by mouth daily.     loperamide 2 MG capsule  Commonly known as:  IMODIUM  Take 1 capsule (2 mg total) by mouth every morning.     multivitamin with minerals Tabs tablet  Take 1 tablet by mouth daily.     ONE TOUCH TEST STRIPS test strip  Generic drug:  glucose blood  1 each by Other route 4 (four) times daily. Reported on 11/18/2015     pantoprazole 40 MG  tablet  Commonly known as:  PROTONIX  TAKE ONE TABLET BY MOUTH ONE TIME DAILY     pravastatin 20 MG tablet  Commonly known as:  PRAVACHOL  Take 1 tablet (20 mg total) by mouth every evening. NEEDS APPOINTMENT FOR FUTURE REFILLS OR 90-DAY SUPPLY     pregabalin 50 MG capsule  Commonly known as:  LYRICA  Take one capsule by mouth in the morning and two capsules by mouth in the evening for pains.     saccharomyces boulardii 250 MG capsule  Commonly known as:  FLORASTOR  Take 1 capsule (250 mg total) by mouth every evening.     tiotropium 18 MCG inhalation capsule  Commonly known as:  SPIRIVA  Place 1 capsule (18 mcg  total) into inhaler and inhale daily.     TOUJEO SOLOSTAR 300 UNIT/ML Sopn  Generic drug:  Insulin Glargine  Inject 24 Units into the skin at bedtime.     traMADol 50 MG tablet  Commonly known as:  ULTRAM  Take 1 tablet (50 mg total) by mouth every 6 (six) hours as needed.     vitamin B-12 1000 MCG tablet  Commonly known as:  CYANOCOBALAMIN  Take 1,000 mcg by mouth daily.        Meds ordered this encounter  Medications  . Insulin Glargine (TOUJEO SOLOSTAR) 300 UNIT/ML SOPN    Sig: Inject 24 Units into the skin at bedtime.  . insulin aspart (NOVOLOG) 100 UNIT/ML injection    Sig: CBG 151-200= 2 units; 201- 250 = 3 units; 251-300 = 5 units; 301- 350 =7 units; 351- 400= 9 units; 401- 450 = 11 units. CBG > 450, Call MD.    Immunization History  Administered Date(s) Administered  . Influenza Split 07/27/2011  . Influenza,inj,Quad PF,36+ Mos 07/20/2013, 06/06/2014, 07/02/2015  . Influenza-Unspecified 06/06/2009, 07/29/2010  . Pneumococcal Conjugate-13 07/02/2015  . Pneumococcal Polysaccharide-23 07/21/2012  . Td 09/14/2006    Social History  Substance Use Topics  . Smoking status: Former Smoker    Types: Cigarettes    Quit date: 09/28/2004  . Smokeless tobacco: Never Used  . Alcohol Use: 0.0 oz/week    0 Standard drinks or equivalent per week      Comment: occ/social (moderate)    Review of Systems  UTO 2/2 MS change; nursing as per HPI    Filed Vitals:   11/18/15 1552  BP: 160/98  Pulse: 81  Temp: 96.8 F (36 C)  Resp: 20    Physical Exam  GENERAL APPEARANCE: sleepy, No acute distress  SKIN: No diaphoresis rash HEENT: Unremarkable RESPIRATORY: Breathing is even, unlabored. Lung sounds are rhonchi, may be some decrease on L.  CARDIOVASCULAR: Heart RRR no murmurs, rubs or gallops. No peripheral edema  GASTROINTESTINAL: Abdomen is soft, non-tender, not distended w/ normal bowel sounds.  GENITOURINARY: Bladder non tender, not distended  MUSCULOSKELETAL: No abnormal joints or musculature NEUROLOGIC: Cranial nerves 2-12 grossly intact. Moves all extremities PSYCHIATRIC: Mood and affect appropriate to situation, no behavioral issues  Patient Active Problem List   Diagnosis Date Noted  . Sick 11/18/2015  . Depression 11/07/2015  . Hypotension 10/29/2015  . Slurred speech   . Hyperglycemia 10/18/2015  . Hyperglycemic crisis in diabetes mellitus (HCC) 10/18/2015  . Aspiration pneumonia (HCC) 10/18/2015  . Acute encephalopathy   . Acute hyperglycemia   . Diarrhea 10/16/2015  . Renal mass, right: Incentendal finding MRI 10/02/2015 10/03/2015  . Hypogonadism in male 10/03/2015  . Dehydration   . Hyperglycemia due to type 1 diabetes mellitus (HCC) 10/02/2015  . Encephalopathy, metabolic   . Heme positive stool 10/01/2015  . Elevated CEA 09/30/2015  . Elevated CA 19-9 level 09/30/2015  . CKD (chronic kidney disease), stage II 09/30/2015  . COPD with exacerbation (HCC) 09/27/2015  . Narcotic dependency, continuous (HCC) 09/27/2015  . AKI (acute kidney injury) (HCC) 09/27/2015  . DKA (diabetic ketoacidosis) (HCC) 09/14/2015  . Narcotic abuse   . Hyperglycemia without ketosis 09/13/2015  . DKA (diabetic ketoacidoses) (HCC) 09/06/2015  . Partial symptomatic epilepsy with complex partial seizures, not intractable, without  status epilepticus (HCC) 07/16/2015  . Dementia without behavioral disturbance 07/16/2015  . Hypersomnia with long sleep time, idiopathic 07/16/2015  . Pruritus of scalp 05/04/2015  . Loose stools 05/04/2015  .  Absence attack (HCC) 05/04/2015  . B12 deficiency 05/04/2015  . Seizures (HCC) 04/10/2015  . Cerebrovascular disease 04/10/2015  . PVD (peripheral vascular disease) (HCC) 02/18/2015  . Testosterone deficiency 01/02/2015  . 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 Right-sided chest wall pain 11/27/2014  . Ecchymosis 08/29/2014  . Anemia- chronic 04/03/2014  . CAD- moderate3V disease at cath 7/15- medical Rx 03/28/2014  . Dyslipidemia 03/13/2014  . Non-STEMI (non-ST elevated myocardial infarction) (HCC) 03/10/2014  . 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 Low back pain potentially associated with spinal stenosis 02/26/2014  . ADHD, adult residual type 05/31/2013  . Elevated AST (SGOT) 01/23/2013  . SHOULDER PAIN, LEFT 03/04/2009  . ANXIETY 07/18/2008  . IRRITABLE BOWEL SYNDROME 04/27/2008  . NOCTURIA 03/13/2008  . COPD (chronic obstructive pulmonary disease) (HCC) 02/27/2008  . Mild nonproliferative diabetic retinopathy (HCC) 11/18/2007  . LEG PAIN, RIGHT 09/29/2007  . POLYNEUROPATHY IN DIABETES 02/09/2007  . HYPOTENSION, ORTHOSTATIC 02/09/2007  . Hypothyroidism 09/27/2006  . Type 1 diabetes mellitus with neurological manifestations, uncontrolled (HCC) 09/27/2006  . ERECTILE DYSFUNCTION 09/27/2006  . Essential hypertension 09/27/2006  . SPINAL STENOSIS, CERVICAL 09/27/2006  . Hypersomnia 09/27/2006    CBC    Component Value Date/Time   WBC 6.7 11/03/2015 0640   WBC 5.0 06/28/2015 1621   WBC 5.3 11/29/2013 1507   RBC 2.96* 11/03/2015 0640   RBC 3.51* 06/28/2015 1621   RBC 3.37* 11/29/2013 1507   RBC 3.02* 11/26/2008 0727   HGB 8.9* 11/03/2015 0640   HGB 11.2* 11/29/2013 1507   HCT  27.8* 11/03/2015 0640   HCT 33.8* 06/28/2015 1621   HCT 34.4* 11/29/2013 1507   PLT 171 11/03/2015 0640   PLT 231 06/28/2015 1621   PLT 215 11/29/2013 1507   MCV 93.9 11/03/2015 0640   MCV 96 06/28/2015 1621   MCV 102.1* 11/29/2013 1507   LYMPHSABS 1.6 10/28/2015 1440   LYMPHSABS 1.3 06/28/2015 1621   LYMPHSABS 0.9 11/29/2013 1507   MONOABS 1.2* 10/28/2015 1440   MONOABS 0.5 11/29/2013 1507   EOSABS 0.1 10/28/2015 1440   EOSABS 0.2 06/28/2015 1621   EOSABS 0.3 11/29/2013 1507   BASOSABS 0.0 10/28/2015 1440   BASOSABS 0.0 06/28/2015 1621   BASOSABS 0.0 11/29/2013 1507    CMP     Component Value Date/Time   NA 142 11/03/2015 0640   NA 138 06/28/2015 1621   NA 143 11/29/2013 1507   K 4.9 11/03/2015 0640   K 5.0 11/29/2013 1507   CL 103 11/03/2015 0640   CL 108* 11/07/2012 1507   CO2 28 11/03/2015 0640   CO2 26 11/29/2013 1507   GLUCOSE 243* 11/03/2015 0640   GLUCOSE 232* 06/28/2015 1621   GLUCOSE 156* 11/29/2013 1507   GLUCOSE 256* 11/07/2012 1507   BUN 25* 11/03/2015 0640   BUN 31* 06/28/2015 1621   BUN 20.9 11/29/2013 1507   CREATININE 1.41* 11/03/2015 0640   CREATININE 1.1 11/29/2013 1507   CALCIUM 9.2 11/03/2015 0640   CALCIUM 9.3 11/29/2013 1507   PROT 5.6* 10/29/2015 0721   PROT 6.6 06/28/2015 1621   PROT 6.6 11/29/2013 1507   ALBUMIN 3.0* 10/29/2015 0721   ALBUMIN 4.1 06/28/2015 1621   ALBUMIN 3.9 11/29/2013 1507   AST 19 10/29/2015 0721   AST 22 11/29/2013 1507   ALT 20 10/29/2015 0721   ALT 20 11/29/2013 1507   ALKPHOS 82 10/29/2015 0721   ALKPHOS 65 11/29/2013 1507  BILITOT 1.2 10/29/2015 0721   BILITOT 0.3 06/28/2015 1621   BILITOT 0.46 11/29/2013 1507   GFRNONAA 48* 11/03/2015 0640   GFRAA 56* 11/03/2015 0640    Assessment and Plan  Sick Pt has something; possible PNA, with cough, he just got over pNA. Have ordered rapid flu, labs CXR and urine. Ordered 1 gm rocephin, I don't trust nursing to keep adequate watch.  Mina Marble can f/u on him  tomorrow. Some of his illness may be his high BS.  Type 1 diabetes mellitus with neurological manifestations, uncontrolled (HCC) I have reviewed BS since 2/27. I see no appreciable change since I inceased his SSI on 3/6. I can't increase long acting-his fasting am BS runs from 74 to 570. I wonder if he is getting SSI every time, his BS throughout the day and from day to day are all over the map. I increased his SSI again today from sensitive to moderate; Will follow.   Time spent > 35 min;> 50% of time with patient was spent reviewing records, labs, tests and studies, counseling and developing plan of care  Merrilee Seashore, MD

## 2015-11-18 NOTE — Assessment & Plan Note (Signed)
Pt has something; possible PNA, with cough, he just got over pNA. Have ordered rapid flu, labs CXR and urine. Ordered 1 gm rocephin, I don't trust nursing to keep adequate watch.  Benjamin Hodge can f/u on him tomorrow. Some of his illness may be his high BS.

## 2015-11-18 NOTE — Progress Notes (Signed)
MRN: [id number] Name: Benjamin Hodge  Sex: male Age: 75 y.o. DOB: [birth year]  PSC #:  Facility/Room: Level Of Care: SNF Provider: Merrilee Seashore D Emergency Contacts: Extended Emergency Contact Information Primary Emergency Contact: Terpstra,[name]  [place] Home Phone: [phone] Work Phone: [phone] Relation: Son Secondary Emergency Contact: Witman,Christina  [place] Home Phone: [phone] Relation: Other  Code Status:   Allergies: Ace inhibitors; Cymbalta; Lipitor; Bee venom; Glucerna; and Angiotensin receptor blockers  Chief Complaint  Patient presents with  . Acute Visit    HPI: Patient is 76 y.o. male who   Past Medical History  Diagnosis Date  . CAD (coronary artery disease)     LAD 40-50% stenosis, first diagonal small 90% stenosis, circumflex 70% stenosis, OM 80% stenosis, right coronary artery 80-90% stenosis.  . Right upper lobe pneumonia 11/28/2008  . Anxiety 07/18/2008  . Irritable bowel syndrome 04/27/2008  . COPD (chronic obstructive pulmonary disease) (HCC) 02/27/2008  . B12 deficiency 02/16/2008    in 5/09: B12 262, MMA 1040  . Chronic diarrhea 01/01/2008    s/p EGD 10/06: H pylori + gastritis, duodenal biopsy normal (Dr. Elnoria Howard); s/p colonoscopy 10/06: hemorrhoids (Dr. Elnoria Howard); evaluated by Dr. Yancey Flemings  in 8/09: tissue transglutaminase AB negative, VIP normal, stool fat content normal, urine 5-HIAA normal; normal GES 11/09 (done for "fluctuating sugars")  . 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 Orthostatic hypotension 02/09/2007  . Hypertension 02/09/2007  . Spinal stenosis in cervical region 05/03    MRI  . Erectile dysfunction 09/27/2006    s/p penile implant  . Anemia, iron deficiency 09/27/2006    neg colonoscopy 2006 - Dr. Elnoria Howard; ferritin 152; hgb  15.7  on 07/07  . Hypothyroidism 09/27/2006    TSH 2.639  07/07  . Hypersomnia 09/27/2006    evaluated by Dr. Jetty Duhamel  (5/08) and Dr. Vickey Huger; PSG 8/09: chronic delayed sleep phase syndrome (patient sleeps during the day and is awake at night), nocturnal myoclonus (eval for RLS and IDA suggested). Pt advised to change sleeping behavior  . Diabetes mellitus type II 09/07/1984    poorly controlled, complicated by peripheral neuropathy, microalbuminuria, mild non proliferative retinopathy, s/p DKA 7/05, on insulin pump x 4/09, started a pump vacation on 11/19/2008  . Hyperlipidemia   . 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 Hemorrhoids   . Chronic kidney disease   . Unspecified hereditary and idiopathic peripheral neuropathy   . Other chronic pain   . Other malaise and fatigue   . Depression   . Hypertrophy of prostate without urinary obstruction and other lower urinary tract symptoms (LUTS)   . Lumbago   . Heart attack (HCC) 03/2014    mild  . Renal mass, right: Incentendal finding MRI 10/02/2015 10/03/2015  . Hypogonadism in male 10/03/2015  . Asthma     Past Surgical History  Procedure Laterality Date  . Penile prosthesis implant    . Tonsillectomy    . Colonoscopy  06/25/2005    Dr. Jeani Hawking  . Left heart catheterization with coronary angiogram [name] 03/13/2014    Procedure: LEFT HEART CATHETERIZATION WITH CORONARY ANGIOGRAM;  Surgeon: Peter M Swaziland, MD;  Location[facility] CATH LAB;  Service: Cardiovascular;  Laterality: [name];  . Peripheral vascular catheterization [name] 02/19/2015    Procedure: Abdominal Aortogram;  Surgeon: Nada Libman, MD;  Location: [facility] INVASIVE CV LAB;  Service: Cardiovascular;  Laterality: [name];  . Peripheral vascular catheterization Left 02/19/2015    Procedure: Lower Extremity Angiography;  Surgeon: Nada Libman, MD;  Location: [facility] INVASIVE CV LAB;  Service: Cardiovascular;  Laterality: Left;      Medication List       This list is accurate as of: 11/18/15  2:16 PM.  Always use your most recent med list.               aspirin 81 MG tablet  Take 81 mg by mouth at bedtime.      budesonide-formoterol 160-4.5 MCG/ACT inhaler  Commonly known as:  SYMBICORT  Inhale 2 puffs into the lungs 2 (two) times daily.     clopidogrel 75 MG tablet  Commonly known as:  PLAVIX  TAKE 1 TABLET BY MOUTH DAILY WITH BREAKFAST     diphenoxylate-atropine 2.5-0.025 MG tablet  Commonly known as:  LOMOTIL  Take 1 tablet by mouth every 6 (six) hours as needed for diarrhea or loose stools.     escitalopram 20 MG tablet  Commonly known as:  LEXAPRO  TAKE ONE TABLET BY MOUTH ONE TIME DAILY     insulin aspart 100 UNIT/ML injection  Commonly known as:  novoLOG  Inject 4 Units into the skin 3 (three) times daily with meals.     insulin aspart 100 UNIT/ML injection  Commonly known as:  novoLOG  70-149: 0 units,  150-210 mg/dL- 1 unit, 601-093 mg/dL-2 units, 235-573 mg/dL-3 units, 220-254 mg/dL-4 units, 270-623 mg/dL-5 units and call MD     levETIRAcetam 1000 MG tablet  Commonly known as:  KEPPRA  Take 1 tablet (1,000 mg total) by mouth 2 (two) times daily. For seizures     levofloxacin 250 MG tablet  Commonly known as:  LEVAQUIN  Take 1 tablet (250 mg total) by mouth daily. For 1day     levothyroxine 75 MCG tablet  Commonly known as:  SYNTHROID, LEVOTHROID  Take 1 tablet (75 mcg total) by mouth daily.     loperamide 2 MG capsule  Commonly known as:  IMODIUM  Take 1 capsule (2 mg total) by mouth every morning.     metoprolol tartrate 25 MG tablet  Commonly known as:  LOPRESSOR  Take One tablet by mouth twice daily to control BP     multivitamin with minerals Tabs tablet  Take 1 tablet by mouth daily.     ONE TOUCH TEST STRIPS test strip  Generic drug:  glucose blood  1 each by Other route 4 (four) times daily. Reported on 11/06/2015     pantoprazole 40 MG tablet  Commonly known as:  PROTONIX  TAKE ONE TABLET BY MOUTH ONE TIME DAILY     pravastatin 20 MG tablet  Commonly known as:  PRAVACHOL  Take 1 tablet (20 mg total) by mouth every evening. NEEDS APPOINTMENT FOR FUTURE  REFILLS OR 90-DAY SUPPLY     pregabalin 50 MG capsule  Commonly known as:  LYRICA  Take one capsule by mouth in the morning and two capsules by mouth in the evening for pains.     saccharomyces boulardii 250 MG capsule  Commonly known as:  FLORASTOR  Take 1 capsule (250 mg total) by mouth every evening.     tiotropium 18 MCG inhalation capsule  Commonly known as:  SPIRIVA  Place 1 capsule (18 mcg total) into inhaler and inhale daily.     TOUJEO SOLOSTAR 300 UNIT/ML Sopn  Generic drug:  Insulin Glargine  Inject 12 Units into the skin 2 (two) times daily.     traMADol 50 MG tablet  Commonly known  as:  ULTRAM  Take 1 tablet (50 mg total) by mouth every 6 (six) hours as needed.     vitamin B-12 1000 MCG tablet  Commonly known as:  CYANOCOBALAMIN  Take 1,000 mcg by mouth daily.        No orders of the defined types were placed in this encounter.    Immunization History  Administered Date(s) Administered  . Influenza Split 07/27/2011  . Influenza,inj,Quad PF,36+ Mos 07/20/2013, 06/06/2014, 07/02/2015  . Influenza-Unspecified 06/06/2009, 07/29/2010  . Pneumococcal Conjugate-13 07/02/2015  . Pneumococcal Polysaccharide-23 07/21/2012  . Td 09/14/2006    Social History  Substance Use Topics  . Smoking status: Former Smoker    Types: Cigarettes    Quit date: 09/28/2004  . Smokeless tobacco: Never Used  . Alcohol Use: 0.0 oz/week    0 Standard drinks or equivalent per week     Comment: occ/social (moderate)    Review of Systems  DATA OBTAINED: from patient, nurse, medical record, family member GENERAL:  no fevers, fatigue, appetite changes SKIN: No itching, rash HEENT: No complaint RESPIRATORY: No cough, wheezing, SOB CARDIAC: No chest pain, palpitations, lower extremity edema  GI: No abdominal pain, No N/V/D or constipation, No heartburn or reflux  GU: No dysuria, frequency or urgency, or incontinence  MUSCULOSKELETAL: No unrelieved bone/joint pain NEUROLOGIC: No  headache, dizziness  PSYCHIATRIC: No overt anxiety or sadness  There were no vitals filed for this visit.  Physical Exam  GENERAL APPEARANCE: Alert, conversant, No acute distress  SKIN: No diaphoresis rash, or wounds HEENT: Unremarkable RESPIRATORY: Breathing is even, unlabored. Lung sounds are clear   CARDIOVASCULAR: Heart RRR no murmurs, rubs or gallops. No peripheral edema  GASTROINTESTINAL: Abdomen is soft, non-tender, not distended w/ normal bowel sounds.  GENITOURINARY: Bladder non tender, not distended  MUSCULOSKELETAL: No abnormal joints or musculature NEUROLOGIC: Cranial nerves 2-12 grossly intact. Moves all extremities PSYCHIATRIC: Mood and affect appropriate to situation, no behavioral issues  Patient Active Problem List   Diagnosis Date Noted  . Depression 11/07/2015  . Hypotension 10/29/2015  . Slurred speech   . Hyperglycemia 10/18/2015  . Hyperglycemic crisis in diabetes mellitus (HCC) 10/18/2015  . Aspiration pneumonia (HCC) 10/18/2015  . Acute encephalopathy   . Acute hyperglycemia   . Diarrhea 10/16/2015  . Renal mass, right: Incentendal finding MRI 10/02/2015 10/03/2015  . Hypogonadism in male 10/03/2015  . Dehydration   . Hyperglycemia due to type 1 diabetes mellitus (HCC) 10/02/2015  . Encephalopathy, metabolic   . Heme positive stool 10/01/2015  . Elevated CEA 09/30/2015  . Elevated CA 19-9 level 09/30/2015  . CKD (chronic kidney disease), stage II 09/30/2015  . COPD with exacerbation (HCC) 09/27/2015  . Narcotic dependency, continuous (HCC) 09/27/2015  . AKI (acute kidney injury) (HCC) 09/27/2015  . DKA (diabetic ketoacidosis) (HCC) 09/14/2015  . Narcotic abuse   . Hyperglycemia without ketosis 09/13/2015  . DKA (diabetic ketoacidoses) (HCC) 09/06/2015  . Partial symptomatic epilepsy with complex partial seizures, not intractable, without status epilepticus (HCC) 07/16/2015  . Dementia without behavioral disturbance 07/16/2015  . Hypersomnia with  long sleep time, idiopathic 07/16/2015  . Pruritus of scalp 05/04/2015  . Loose stools 05/04/2015  . Absence attack (HCC) 05/04/2015  . B12 deficiency 05/04/2015  . Seizures (HCC) 04/10/2015  . Cerebrovascular disease 04/10/2015  . PVD (peripheral vascular disease) (HCC) 02/18/2015  . Testosterone deficiency 01/02/2015  . 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 Right-sided chest wall pain 11/27/2014  . Ecchymosis 08/29/2014  .  Anemia- chronic 04/03/2014  . CAD- moderate3V disease at cath 7/15- medical Rx 03/28/2014  . Dyslipidemia 03/13/2014  . Non-STEMI (non-ST elevated myocardial infarction) (HCC) 03/10/2014  . 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 Low back pain potentially associated with spinal stenosis 02/26/2014  . ADHD, adult residual type 05/31/2013  . Elevated AST (SGOT) 01/23/2013  . SHOULDER PAIN, LEFT 03/04/2009  . ANXIETY 07/18/2008  . IRRITABLE BOWEL SYNDROME 04/27/2008  . NOCTURIA 03/13/2008  . COPD (chronic obstructive pulmonary disease) (HCC) 02/27/2008  . Mild nonproliferative diabetic retinopathy (HCC) 11/18/2007  . LEG PAIN, RIGHT 09/29/2007  . POLYNEUROPATHY IN DIABETES 02/09/2007  . HYPOTENSION, ORTHOSTATIC 02/09/2007  . Hypothyroidism 09/27/2006  . Type 1 diabetes mellitus with neurological manifestations, uncontrolled (HCC) 09/27/2006  . ERECTILE DYSFUNCTION 09/27/2006  . Essential hypertension 09/27/2006  . SPINAL STENOSIS, CERVICAL 09/27/2006  . Hypersomnia 09/27/2006    CBC    Component Value Date/Time   WBC 6.7 11/03/2015 0640   WBC 5.0 06/28/2015 1621   WBC 5.3 11/29/2013 1507   RBC 2.96* 11/03/2015 0640   RBC 3.51* 06/28/2015 1621   RBC 3.37* 11/29/2013 1507   RBC 3.02* 11/26/2008 0727   HGB 8.9* 11/03/2015 0640   HGB 11.2* 11/29/2013 1507   HCT 27.8* 11/03/2015 0640   HCT 33.8* 06/28/2015 1621   HCT 34.4* 11/29/2013 1507   PLT 171 11/03/2015 0640   PLT  231 06/28/2015 1621   PLT 215 11/29/2013 1507   MCV 93.9 11/03/2015 0640   MCV 96 06/28/2015 1621   MCV 102.1* 11/29/2013 1507   LYMPHSABS 1.6 10/28/2015 1440   LYMPHSABS 1.3 06/28/2015 1621   LYMPHSABS 0.9 11/29/2013 1507   MONOABS 1.2* 10/28/2015 1440   MONOABS 0.5 11/29/2013 1507   EOSABS 0.1 10/28/2015 1440   EOSABS 0.2 06/28/2015 1621   EOSABS 0.3 11/29/2013 1507   BASOSABS 0.0 10/28/2015 1440   BASOSABS 0.0 06/28/2015 1621   BASOSABS 0.0 11/29/2013 1507    CMP     Component Value Date/Time   NA 142 11/03/2015 0640   NA 138 06/28/2015 1621   NA 143 11/29/2013 1507   K 4.9 11/03/2015 0640   K 5.0 11/29/2013 1507   CL 103 11/03/2015 0640   CL 108* 11/07/2012 1507   CO2 28 11/03/2015 0640   CO2 26 11/29/2013 1507   GLUCOSE 243* 11/03/2015 0640   GLUCOSE 232* 06/28/2015 1621   GLUCOSE 156* 11/29/2013 1507   GLUCOSE 256* 11/07/2012 1507   BUN 25* 11/03/2015 0640   BUN 31* 06/28/2015 1621   BUN 20.9 11/29/2013 1507   CREATININE 1.41* 11/03/2015 0640   CREATININE 1.1 11/29/2013 1507   CALCIUM 9.2 11/03/2015 0640   CALCIUM 9.3 11/29/2013 1507   PROT 5.6* 10/29/2015 0721   PROT 6.6 06/28/2015 1621   PROT 6.6 11/29/2013 1507   ALBUMIN 3.0* 10/29/2015 0721   ALBUMIN 4.1 06/28/2015 1621   ALBUMIN 3.9 11/29/2013 1507   AST 19 10/29/2015 0721   AST 22 11/29/2013 1507   ALT 20 10/29/2015 0721   ALT 20 11/29/2013 1507   ALKPHOS 82 10/29/2015 0721   ALKPHOS 65 11/29/2013 1507   BILITOT 1.2 10/29/2015 0721   BILITOT 0.3 06/28/2015 1621   BILITOT 0.46 11/29/2013 1507   GFRNONAA 48* 11/03/2015 0640   GFRAA 56* 11/03/2015 0640    Assessment and Plan  No problem-specific assessment & plan notes found for this encounter.   Margit Hanks, MD

## 2015-11-19 ENCOUNTER — Non-Acute Institutional Stay (SKILLED_NURSING_FACILITY): Payer: Medicare Other | Admitting: Adult Health

## 2015-11-19 ENCOUNTER — Encounter: Payer: Self-pay | Admitting: Adult Health

## 2015-11-19 ENCOUNTER — Ambulatory Visit: Payer: Medicare Other | Admitting: Internal Medicine

## 2015-11-19 DIAGNOSIS — J189 Pneumonia, unspecified organism: Secondary | ICD-10-CM | POA: Diagnosis not present

## 2015-11-19 NOTE — Progress Notes (Signed)
Patient ID: Benjamin Hodge, male   DOB: 04/07/1941, 75 y.o.   MRN: [id number]   Facility:  [facility]       Allergies  Allergen Reactions  . Ace Inhibitors Other (See Comments)    dizzy  . Cymbalta [Duloxetine Hcl] Other (See Comments)    dizzy  . Lipitor [Atorvastatin] Other (See Comments)    Caused pain all over body.  . Bee Venom Swelling  . Glucerna [Nutritional Supplements] Diarrhea  . Angiotensin Receptor Blockers Other (See Comments)    unknown    Chief Complaint  Patient presents with  . Acute Visit    HPI:  He is requiring 02 which is new for him he is less responsive to his surroundings. He is febrile at 101.2 at this time. I have spoken with his son regarding his status. I am concerned that he has aspirated; although the chest x-ray does not demonstrate pneumonia. He son would like to avoid hospitalization if possible.    Past Medical History  Diagnosis Date  . CAD (coronary artery disease)     LAD 40-50% stenosis, first diagonal small 90% stenosis, circumflex 70% stenosis, OM 80% stenosis, right coronary artery 80-90% stenosis.  . Right upper lobe pneumonia 11/28/2008  . Anxiety 07/18/2008  . Irritable bowel syndrome 04/27/2008  . COPD (chronic obstructive pulmonary disease) (HCC) 02/27/2008  . B12 deficiency 02/16/2008    in 5/09: B12 262, MMA 1040  . Chronic diarrhea 01/01/2008    s/p EGD 10/06: H pylori + gastritis, duodenal biopsy normal (Dr. Elnoria Howard); s/p colonoscopy 10/06: hemorrhoids (Dr. Elnoria Howard); evaluated by Dr. Yancey Flemings  in 8/09: tissue transglutaminase AB negative, VIP normal, stool fat content normal, urine 5-HIAA normal; normal GES 11/09 (done for "fluctuating sugars")  . 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 Orthostatic hypotension 02/09/2007  . Hypertension 02/09/2007  . Spinal stenosis in cervical region 05/03    MRI  . Erectile dysfunction 09/27/2006    s/p penile implant  . Anemia, iron deficiency 09/27/2006    neg colonoscopy 2006 - Dr. Elnoria Howard; ferritin 152; hgb  15.7  on 07/07  . Hypothyroidism 09/27/2006    TSH 2.639  07/07  . Hypersomnia 09/27/2006    evaluated by Dr. Jetty Duhamel (5/08) and Dr. Vickey Huger; PSG 8/09: chronic delayed sleep phase syndrome (patient sleeps during the day and is awake at night), nocturnal myoclonus (eval for RLS and IDA suggested). Pt advised to change sleeping behavior  . Diabetes mellitus type II 09/07/1984    poorly controlled, complicated by peripheral neuropathy, microalbuminuria, mild non proliferative retinopathy, s/p DKA 7/05, on insulin pump x 4/09, started a pump vacation on 11/19/2008  . Hyperlipidemia   . 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 Hemorrhoids   . Chronic kidney disease   . Unspecified hereditary and idiopathic peripheral neuropathy   . Other chronic pain   . Other malaise and fatigue   . Depression   . Hypertrophy of prostate without urinary obstruction and other lower urinary tract symptoms (LUTS)   . Lumbago   . Heart attack (HCC) 03/2014    mild  . Renal mass, right: Incentendal finding MRI 10/02/2015 10/03/2015  . Hypogonadism in male 10/03/2015  . Asthma     Past Surgical History  Procedure Laterality Date  . Penile prosthesis implant    . Tonsillectomy    . Colonoscopy  06/25/2005    Dr. Jeani Hawking  . Left heart catheterization with coronary angiogram [name] 03/13/2014    Procedure: LEFT HEART CATHETERIZATION WITH  CORONARY ANGIOGRAM;  Surgeon: Peter M Swaziland, MD;  Location: [facility] CATH LAB;  Service: Cardiovascular;  Laterality: [name];  . Peripheral vascular catheterization [name] 02/19/2015    Procedure: Abdominal Aortogram;  Surgeon: Nada Libman, MD;  Location: [facility] INVASIVE CV LAB;  Service: Cardiovascular;  Laterality: [name];  . Peripheral vascular catheterization Left 02/19/2015    Procedure: Lower Extremity Angiography;  Surgeon: Nada Libman, MD;  Location: [facility] INVASIVE CV LAB;  Service: Cardiovascular;  Laterality: Left;    VITAL SIGNS BP  107/62 mmHg  Pulse 86  Temp(Src) 98 F (36.7 C) (Oral)  Resp 16  SpO2 95%  Patient's Medications  New Prescriptions   No medications on file  Previous Medications   ASPIRIN 81 MG TABLET    Take 81 mg by mouth at bedtime.    BUDESONIDE-FORMOTEROL (SYMBICORT) 160-4.5 MCG/ACT INHALER    Inhale 2 puffs into the lungs 2 (two) times daily.   CLOPIDOGREL (PLAVIX) 75 MG TABLET    TAKE 1 TABLET BY MOUTH DAILY WITH BREAKFAST   DIPHENOXYLATE-ATROPINE (LOMOTIL) 2.5-0.025 MG TABLET    Take 1 tablet by mouth every 6 (six) hours as needed for diarrhea or loose stools.   ESCITALOPRAM (LEXAPRO) 20 MG TABLET    TAKE ONE TABLET BY MOUTH ONE TIME DAILY   INSULIN ASPART (NOVOLOG) 100 UNIT/ML INJECTION    Inject 4 Units into the skin 3 (three) times daily with meals.   INSULIN ASPART (NOVOLOG) 100 UNIT/ML INJECTION    CBG 151-200= 2 units; 201- 250 = 3 units; 251-300 = 5 units; 301- 350 =7 units; 351- 400= 9 units; 401- 450 = 11 units. CBG > 450, Call MD.   INSULIN GLARGINE (TOUJEO SOLOSTAR) 300 UNIT/ML SOPN    Inject 24 Units into the skin at bedtime.   LEVETIRACETAM (KEPPRA) 1000 MG TABLET    Take 1 tablet (1,000 mg total) by mouth 2 (two) times daily. For seizures   LEVOTHYROXINE (SYNTHROID, LEVOTHROID) 75 MCG TABLET    Take 1 tablet (75 mcg total) by mouth daily.   LOPERAMIDE (IMODIUM) 2 MG CAPSULE    Take 1 capsule (2 mg total) by mouth every morning.   METOPROLOL TARTRATE (LOPRESSOR) 25 MG TABLET    Take 25 mg by mouth 2 (two) times daily.   MULTIPLE VITAMIN (MULTIVITAMIN WITH MINERALS) TABS TABLET    Take 1 tablet by mouth daily.   PANTOPRAZOLE (PROTONIX) 40 MG TABLET    TAKE ONE TABLET BY MOUTH ONE TIME DAILY   PRAVASTATIN (PRAVACHOL) 20 MG TABLET    Take 1 tablet (20 mg total) by mouth every evening. NEEDS APPOINTMENT FOR FUTURE REFILLS OR 90-DAY SUPPLY   PREGABALIN (LYRICA) 100 MG CAPSULE    Take 100 mg by mouth at bedtime.   PREGABALIN (LYRICA) 50 MG CAPSULE    Take 50 mg by mouth daily.    SACCHAROMYCES BOULARDII (FLORASTOR) 250 MG CAPSULE    Take 1 capsule (250 mg total) by mouth every evening.   TIOTROPIUM (SPIRIVA) 18 MCG INHALATION CAPSULE    Place 1 capsule (18 mcg total) into inhaler and inhale daily.   VITAMIN B-12 (CYANOCOBALAMIN) 1000 MCG TABLET    Take 1,000 mcg by mouth daily.  Modified Medications   No medications on file  Discontinued Medications     SIGNIFICANT DIAGNOSTIC EXAMS  10-28-15: chest x-ray: Chronically elevated left hemidiaphragm. Newly seen left base infiltrate consistent with bronchopneumonia.  10-28-15: ct of head: 1. Apparent circumferential wall thickening in the terminal ileum and cecum at  the level of the ileocecal valve. This is associated with more definite wall thickening in the rectum. Given the history of diarrhea, infectious/inflammatory enterocolitis would be a consideration. Neoplasm cannot be excluded. 2. Apparent interval increase in size of the right renal lesion evaluated by previous MRI and and found to be suspicious for possible papillary type renal cell carcinoma at that time. This is difficult to assess on today's noncontrast exam.  10-31-15: swallow study: Regular solids;Thin liquid   11-18-15: chest x-ray: no acute cardiopulmonary disease process    LABS REVIEWED:   09-14-15: hgb a1c 12.4 09-28-15: vit B12: 1841; CA 19-9: 113; CEA; 12.2;  10-19-15: tsh 1.783 11-03-15: wbc 6.7 hgb 8.9; hct 27.8; mcv 93.9; plt 171; glucose 243; bun 25; creat 1.41; k+ 4.9; na++142  11-18-15: UA: neg; flu swab: neg 11-19-15: wbc 9.5; hgb 8.4; hct 27.0; mcv 92.3; plt 171; glucose 282; bun 59.6; creat 2.45; k+ 4.4; na++149; liver normal albumin albumin 3.1   Review of Systems  Unable to perform ROS: dementia     Physical Exam  Constitutional: No distress.  Thin   Eyes: Conjunctivae are normal.  Neck: Neck supple. No JVD present. No thyromegaly present.  Cardiovascular: Normal rate, regular rhythm and intact distal pulses.   Respiratory:  Effort normal and breath sounds normal. No respiratory distress. He has no wheezes.  GI: Soft. Bowel sounds are normal. He exhibits no distension. There is no tenderness.  Musculoskeletal: He exhibits no edema.  Able to move all extremities   Lymphadenopathy:    He has no cervical adenopathy.  Neurological: He is alert.  Skin: Skin is warm and dry. He is not diaphoretic.  Psychiatric: He has a normal mood and affect.       ASSESSMENT/ PLAN:  1. Pneumonia: will begin 1/2 NS 200 fluid bolus then 75 cc per hour for 2 liters. Will repeat a chest x-ray. Will repeat bmp in the AM will begin rocephin 1 gm IV daily for 10 days; will set up palliative care consult.    Time spent with patient 45   minutes >50% time spent counseling; reviewing medical record; tests; labs; and developing future plan of care    Synthia Innocent NP [facility]  Contact [phone] Monday through Friday 8am- 5pm  After hours call [phone]

## 2015-11-22 ENCOUNTER — Encounter (HOSPITAL_COMMUNITY): Payer: Self-pay | Admitting: Emergency Medicine

## 2015-11-22 ENCOUNTER — Emergency Department (HOSPITAL_COMMUNITY): Payer: Medicare Other

## 2015-11-22 ENCOUNTER — Inpatient Hospital Stay (HOSPITAL_COMMUNITY)
Admission: EM | Admit: 2015-11-22 | Discharge: 2015-11-26 | DRG: 871 | Disposition: A | Discharge status: Skilled Nursing Facility | Payer: Medicare Other | Attending: Internal Medicine | Admitting: Internal Medicine

## 2015-11-22 DIAGNOSIS — R1319 Other dysphagia: Secondary | ICD-10-CM | POA: Diagnosis present

## 2015-11-22 DIAGNOSIS — I2510 Atherosclerotic heart disease of native coronary artery without angina pectoris: Secondary | ICD-10-CM | POA: Diagnosis present

## 2015-11-22 DIAGNOSIS — I739 Peripheral vascular disease, unspecified: Secondary | ICD-10-CM | POA: Diagnosis present

## 2015-11-22 DIAGNOSIS — D649 Anemia, unspecified: Secondary | ICD-10-CM | POA: Diagnosis present

## 2015-11-22 DIAGNOSIS — S37099D Other injury of unspecified kidney, subsequent encounter: Secondary | ICD-10-CM | POA: Diagnosis not present

## 2015-11-22 DIAGNOSIS — J9601 Acute respiratory failure with hypoxia: Secondary | ICD-10-CM | POA: Diagnosis present

## 2015-11-22 DIAGNOSIS — Y95 Nosocomial condition: Secondary | ICD-10-CM | POA: Diagnosis present

## 2015-11-22 DIAGNOSIS — J189 Pneumonia, unspecified organism: Secondary | ICD-10-CM | POA: Diagnosis not present

## 2015-11-22 DIAGNOSIS — E113299 Type 2 diabetes mellitus with mild nonproliferative diabetic retinopathy without macular edema, unspecified eye: Secondary | ICD-10-CM | POA: Diagnosis present

## 2015-11-22 DIAGNOSIS — IMO0002 Reserved for concepts with insufficient information to code with codable children: Secondary | ICD-10-CM | POA: Diagnosis present

## 2015-11-22 DIAGNOSIS — A419 Sepsis, unspecified organism: Principal | ICD-10-CM | POA: Diagnosis present

## 2015-11-22 DIAGNOSIS — E538 Deficiency of other specified B group vitamins: Secondary | ICD-10-CM | POA: Diagnosis present

## 2015-11-22 DIAGNOSIS — Z9119 Patient's noncompliance with other medical treatment and regimen: Secondary | ICD-10-CM | POA: Diagnosis not present

## 2015-11-22 DIAGNOSIS — I129 Hypertensive chronic kidney disease with stage 1 through stage 4 chronic kidney disease, or unspecified chronic kidney disease: Secondary | ICD-10-CM | POA: Diagnosis not present

## 2015-11-22 DIAGNOSIS — E11649 Type 2 diabetes mellitus with hypoglycemia without coma: Secondary | ICD-10-CM | POA: Diagnosis present

## 2015-11-22 DIAGNOSIS — N2889 Other specified disorders of kidney and ureter: Secondary | ICD-10-CM | POA: Diagnosis present

## 2015-11-22 DIAGNOSIS — F0390 Unspecified dementia without behavioral disturbance: Secondary | ICD-10-CM | POA: Diagnosis present

## 2015-11-22 DIAGNOSIS — E1149 Type 2 diabetes mellitus with other diabetic neurological complication: Secondary | ICD-10-CM | POA: Diagnosis present

## 2015-11-22 DIAGNOSIS — E1022 Type 1 diabetes mellitus with diabetic chronic kidney disease: Secondary | ICD-10-CM | POA: Diagnosis not present

## 2015-11-22 DIAGNOSIS — E1065 Type 1 diabetes mellitus with hyperglycemia: Secondary | ICD-10-CM

## 2015-11-22 DIAGNOSIS — E039 Hypothyroidism, unspecified: Secondary | ICD-10-CM | POA: Diagnosis present

## 2015-11-22 DIAGNOSIS — D638 Anemia in other chronic diseases classified elsewhere: Secondary | ICD-10-CM | POA: Diagnosis not present

## 2015-11-22 DIAGNOSIS — Z515 Encounter for palliative care: Secondary | ICD-10-CM | POA: Diagnosis not present

## 2015-11-22 DIAGNOSIS — G9340 Encephalopathy, unspecified: Secondary | ICD-10-CM | POA: Diagnosis not present

## 2015-11-22 DIAGNOSIS — I10 Essential (primary) hypertension: Secondary | ICD-10-CM | POA: Diagnosis present

## 2015-11-22 DIAGNOSIS — E860 Dehydration: Secondary | ICD-10-CM | POA: Diagnosis present

## 2015-11-22 DIAGNOSIS — N179 Acute kidney failure, unspecified: Secondary | ICD-10-CM | POA: Diagnosis present

## 2015-11-22 DIAGNOSIS — N189 Chronic kidney disease, unspecified: Secondary | ICD-10-CM | POA: Diagnosis present

## 2015-11-22 DIAGNOSIS — R069 Unspecified abnormalities of breathing: Secondary | ICD-10-CM | POA: Diagnosis not present

## 2015-11-22 DIAGNOSIS — Z66 Do not resuscitate: Secondary | ICD-10-CM | POA: Diagnosis not present

## 2015-11-22 DIAGNOSIS — G40909 Epilepsy, unspecified, not intractable, without status epilepticus: Secondary | ICD-10-CM | POA: Diagnosis present

## 2015-11-22 DIAGNOSIS — J9691 Respiratory failure, unspecified with hypoxia: Secondary | ICD-10-CM

## 2015-11-22 DIAGNOSIS — E1049 Type 1 diabetes mellitus with other diabetic neurological complication: Secondary | ICD-10-CM | POA: Diagnosis present

## 2015-11-22 DIAGNOSIS — J188 Other pneumonia, unspecified organism: Secondary | ICD-10-CM | POA: Diagnosis not present

## 2015-11-22 DIAGNOSIS — R402421 Glasgow coma scale score 9-12, in the field [EMT or ambulance]: Secondary | ICD-10-CM | POA: Diagnosis not present

## 2015-11-22 DIAGNOSIS — J449 Chronic obstructive pulmonary disease, unspecified: Secondary | ICD-10-CM | POA: Diagnosis not present

## 2015-11-22 DIAGNOSIS — R569 Unspecified convulsions: Secondary | ICD-10-CM

## 2015-11-22 DIAGNOSIS — Z87891 Personal history of nicotine dependence: Secondary | ICD-10-CM | POA: Diagnosis not present

## 2015-11-22 DIAGNOSIS — N183 Chronic kidney disease, stage 3 (moderate): Secondary | ICD-10-CM | POA: Diagnosis present

## 2015-11-22 LAB — COMPREHENSIVE METABOLIC PANEL
ALT: 39 U/L (ref 17–63)
AST: 61 U/L — ABNORMAL HIGH (ref 15–41)
Albumin: 2.6 g/dL — ABNORMAL LOW (ref 3.5–5.0)
Alkaline Phosphatase: 81 U/L (ref 38–126)
Anion gap: 14 (ref 5–15)
BUN: 50 mg/dL — AB (ref 6–20)
CALCIUM: 8.8 mg/dL — AB (ref 8.9–10.3)
CO2: 21 mmol/L — ABNORMAL LOW (ref 22–32)
CREATININE: 2.03 mg/dL — AB (ref 0.61–1.24)
Chloride: 108 mmol/L (ref 101–111)
GFR calc non Af Amer: 31 mL/min — ABNORMAL LOW (ref >60)
GFR, EST AFRICAN AMERICAN: 36 mL/min — AB (ref >60)
Glucose, Bld: 90 mg/dL (ref 65–99)
Potassium: 4.3 mmol/L (ref 3.5–5.1)
SODIUM: 143 mmol/L (ref 135–145)
Total Bilirubin: 0.2 mg/dL — ABNORMAL LOW (ref 0.3–1.2)
Total Protein: 6.3 g/dL — ABNORMAL LOW (ref 6.5–8.1)

## 2015-11-22 LAB — I-STAT ARTERIAL BLOOD GAS, ED
Acid-base deficit: 6 mmol/L — ABNORMAL HIGH (ref 0.0–2.0)
Acid-base deficit: 8 mmol/L — ABNORMAL HIGH (ref 0.0–2.0)
BICARBONATE: 20.7 meq/L (ref 20.0–24.0)
Bicarbonate: 18 mEq/L — ABNORMAL LOW (ref 20.0–24.0)
O2 SAT: 87 %
O2 Saturation: 97 %
PCO2 ART: 44.5 mmHg (ref 35.0–45.0)
PCO2 ART: 38.6 mmHg (ref 35.0–45.0)
PH ART: 7.277 — AB (ref 7.350–7.450)
PO2 ART: 61 mmHg — AB (ref 80.0–100.0)
Patient temperature: 99.1
TCO2: 22 mmol/L (ref 0–100)
TCO2: 19 mmol/L (ref 0–100)
pH, Arterial: 7.277 — ABNORMAL LOW (ref 7.350–7.450)
pO2, Arterial: 101 mmHg — ABNORMAL HIGH (ref 80.0–100.0)

## 2015-11-22 LAB — INFLUENZA PANEL BY PCR (TYPE A & B)
H1N1FLUPCR: NOT DETECTED
Influenza A By PCR: NEGATIVE
Influenza B By PCR: NEGATIVE

## 2015-11-22 LAB — CBG MONITORING, ED
GLUCOSE-CAPILLARY: 110 mg/dL — AB (ref 65–99)
GLUCOSE-CAPILLARY: 134 mg/dL — AB (ref 65–99)
GLUCOSE-CAPILLARY: 158 mg/dL — AB (ref 65–99)
GLUCOSE-CAPILLARY: 251 mg/dL — AB (ref 65–99)
Glucose-Capillary: 86 mg/dL (ref 65–99)
Glucose-Capillary: 77 mg/dL (ref 65–99)
Glucose-Capillary: 216 mg/dL — ABNORMAL HIGH (ref 65–99)

## 2015-11-22 LAB — URINALYSIS, ROUTINE W REFLEX MICROSCOPIC
Bilirubin Urine: NEGATIVE
GLUCOSE, UA: 100 mg/dL — AB
HGB URINE DIPSTICK: NEGATIVE
Ketones, ur: NEGATIVE mg/dL
LEUKOCYTES UA: NEGATIVE
Nitrite: NEGATIVE
PH: 5 (ref 5.0–8.0)
PROTEIN: NEGATIVE mg/dL
Specific Gravity, Urine: 1.015 (ref 1.005–1.030)

## 2015-11-22 LAB — CBC WITH DIFFERENTIAL/PLATELET
BASOS PCT: 0 %
Basophils Absolute: 0 10*3/uL (ref 0.0–0.1)
EOS ABS: 0 10*3/uL (ref 0.0–0.7)
EOS PCT: 0 %
HCT: 28.7 % — ABNORMAL LOW (ref 39.0–52.0)
HEMOGLOBIN: 9.4 g/dL — AB (ref 13.0–17.0)
LYMPHS ABS: 0.5 10*3/uL — AB (ref 0.7–4.0)
Lymphocytes Relative: 12 %
MCH: 30.2 pg (ref 26.0–34.0)
MCHC: 32.8 g/dL (ref 30.0–36.0)
MCV: 92.3 fL (ref 78.0–100.0)
MONOS PCT: 5 %
Monocytes Absolute: 0.2 10*3/uL (ref 0.1–1.0)
NEUTROS PCT: 83 %
Neutro Abs: 3.2 10*3/uL (ref 1.7–7.7)
PLATELETS: 273 10*3/uL (ref 150–400)
RBC: 3.11 MIL/uL — AB (ref 4.22–5.81)
RDW: 13.2 % (ref 11.5–15.5)
WBC: 3.9 10*3/uL — AB (ref 4.0–10.5)

## 2015-11-22 LAB — PROTIME-INR
INR: 1.04 (ref 0.00–1.49)
Prothrombin Time: 13.8 seconds (ref 11.6–15.2)

## 2015-11-22 LAB — APTT: APTT: 38 s — AB (ref 24–37)

## 2015-11-22 LAB — GLUCOSE, CAPILLARY
GLUCOSE-CAPILLARY: 301 mg/dL — AB (ref 65–99)
GLUCOSE-CAPILLARY: 215 mg/dL — AB (ref 65–99)

## 2015-11-22 LAB — CBC
HCT: 25.5 % — ABNORMAL LOW (ref 39.0–52.0)
Hemoglobin: 8.1 g/dL — ABNORMAL LOW (ref 13.0–17.0)
MCH: 28.8 pg (ref 26.0–34.0)
MCHC: 31.8 g/dL (ref 30.0–36.0)
MCV: 90.7 fL (ref 78.0–100.0)
PLATELETS: 226 10*3/uL (ref 150–400)
RBC: 2.81 MIL/uL — AB (ref 4.22–5.81)
RDW: 13.1 % (ref 11.5–15.5)
WBC: 3.8 10*3/uL — AB (ref 4.0–10.5)

## 2015-11-22 LAB — TROPONIN I
TROPONIN I: 0.05 ng/mL — AB (ref <0.031)
Troponin I: <0.03 ng/mL (ref <0.031)

## 2015-11-22 LAB — I-STAT CG4 LACTIC ACID, ED
LACTIC ACID, VENOUS: 1.36 mmol/L (ref 0.5–2.0)
Lactic Acid, Venous: 1.35 mmol/L (ref 0.5–2.0)

## 2015-11-22 LAB — BRAIN NATRIURETIC PEPTIDE: B NATRIURETIC PEPTIDE 5: 262.2 pg/mL — AB (ref 0.0–100.0)

## 2015-11-22 LAB — CREATININE, SERUM
CREATININE: 2.02 mg/dL — AB (ref 0.61–1.24)
GFR, EST AFRICAN AMERICAN: 36 mL/min — AB (ref >60)
GFR, EST NON AFRICAN AMERICAN: 31 mL/min — AB (ref >60)

## 2015-11-22 LAB — PROCALCITONIN: Procalcitonin: 10.81 ng/mL

## 2015-11-22 LAB — MRSA PCR SCREENING: MRSA by PCR: NEGATIVE

## 2015-11-22 MED ORDER — INSULIN GLARGINE 300 UNIT/ML ~~LOC~~ SOPN: 24.0000 [IU] | PEN_INJECTOR | Freq: Every day | SUBCUTANEOUS | Status: DC

## 2015-11-22 MED ORDER — VANCOMYCIN HCL IN DEXTROSE 1-5 GM/200ML-% IV SOLN: 1000.0000 mg | Freq: Once | INTRAVENOUS | Status: DC

## 2015-11-22 MED ORDER — MORPHINE SULFATE (PF) 2 MG/ML IV SOLN: 1.0000 mg | INTRAVENOUS | Status: DC | PRN

## 2015-11-22 MED ORDER — DEXTROSE 5 % IV SOLN: 2.0000 g | Freq: Once | INTRAVENOUS | Status: DC

## 2015-11-22 MED ORDER — IPRATROPIUM-ALBUTEROL 0.5-2.5 (3) MG/3ML IN SOLN
3.0000 mL | Freq: Four times a day (QID) | RESPIRATORY_TRACT | Status: DC
Start: 2015-11-22 — End: 2015-11-22
  Administered 2015-11-22 (×2): 3 mL via RESPIRATORY_TRACT
  Filled 2015-11-22 (×2): qty 3

## 2015-11-22 MED ORDER — INSULIN GLARGINE 100 UNIT/ML ~~LOC~~ SOLN
12.0000 [IU] | Freq: Every day | SUBCUTANEOUS | Status: DC
  Administered 2015-11-22 – 2015-11-23 (×2): 12 [IU] via SUBCUTANEOUS
  Filled 2015-11-22 (×3): qty 0.12

## 2015-11-22 MED ORDER — ALBUTEROL SULFATE (2.5 MG/3ML) 0.083% IN NEBU
2.5000 mg | INHALATION_SOLUTION | RESPIRATORY_TRACT | Status: DC
  Administered 2015-11-22: 2.5 mg via RESPIRATORY_TRACT
  Filled 2015-11-22: qty 3

## 2015-11-22 MED ORDER — ACETAMINOPHEN 325 MG PO TABS: 650.0000 mg | ORAL_TABLET | Freq: Four times a day (QID) | ORAL | Status: DC | PRN

## 2015-11-22 MED ORDER — DEXTROSE 5 % IV SOLN
2.0000 g | INTRAVENOUS | Status: DC
  Administered 2015-11-23 – 2015-11-25 (×3): 2 g via INTRAVENOUS
  Filled 2015-11-22 (×4): qty 2

## 2015-11-22 MED ORDER — ACETAMINOPHEN 650 MG RE SUPP: 650.0000 mg | Freq: Four times a day (QID) | RECTAL | Status: DC | PRN

## 2015-11-22 MED ORDER — VANCOMYCIN HCL IN DEXTROSE 750-5 MG/150ML-% IV SOLN
750.0000 mg | INTRAVENOUS | Status: DC
  Administered 2015-11-23 – 2015-11-25 (×3): 750 mg via INTRAVENOUS
  Filled 2015-11-22 (×5): qty 150

## 2015-11-22 MED ORDER — ACETAMINOPHEN 650 MG RE SUPP
650.0000 mg | Freq: Once | RECTAL | Status: AC
  Administered 2015-11-22: 650 mg via RECTAL
  Filled 2015-11-22: qty 1

## 2015-11-22 MED ORDER — HEPARIN SODIUM (PORCINE) 5000 UNIT/ML IJ SOLN
5000.0000 [IU] | Freq: Three times a day (TID) | INTRAMUSCULAR | Status: DC
  Administered 2015-11-22 – 2015-11-25 (×7): 5000 [IU] via SUBCUTANEOUS
  Filled 2015-11-22 (×8): qty 1

## 2015-11-22 MED ORDER — LEVOTHYROXINE SODIUM 100 MCG IV SOLR
37.5000 ug | Freq: Every day | INTRAVENOUS | Status: DC
  Administered 2015-11-22 – 2015-11-25 (×4): 37.5 ug via INTRAVENOUS
  Filled 2015-11-22 (×6): qty 5

## 2015-11-22 MED ORDER — ONDANSETRON HCL 4 MG PO TABS: 4.0000 mg | ORAL_TABLET | Freq: Four times a day (QID) | ORAL | Status: DC | PRN

## 2015-11-22 MED ORDER — SODIUM CHLORIDE 0.9 % IV BOLUS (SEPSIS)
1000.0000 mL | Freq: Once | INTRAVENOUS | Status: AC
  Administered 2015-11-22: 1000 mL via INTRAVENOUS

## 2015-11-22 MED ORDER — ONDANSETRON HCL 4 MG/2ML IJ SOLN: 4.0000 mg | Freq: Four times a day (QID) | INTRAMUSCULAR | Status: DC | PRN

## 2015-11-22 MED ORDER — MAGNESIUM SULFATE 2 GM/50ML IV SOLN
2.0000 g | Freq: Once | INTRAVENOUS | Status: AC
  Administered 2015-11-22: 2 g via INTRAVENOUS
  Filled 2015-11-22: qty 50

## 2015-11-22 MED ORDER — DEXTROSE 5 % IV SOLN
2.0000 g | Freq: Once | INTRAVENOUS | Status: AC
  Administered 2015-11-22: 2 g via INTRAVENOUS
  Filled 2015-11-22: qty 2

## 2015-11-22 MED ORDER — BISACODYL 10 MG RE SUPP
10.0000 mg | Freq: Every day | RECTAL | Status: DC | PRN
  Administered 2015-11-25: 10 mg via RECTAL
  Filled 2015-11-22: qty 1

## 2015-11-22 MED ORDER — SODIUM CHLORIDE 0.9 % IV SOLN
1000.0000 mg | Freq: Two times a day (BID) | INTRAVENOUS | Status: DC
  Administered 2015-11-22 – 2015-11-26 (×9): 1000 mg via INTRAVENOUS
  Filled 2015-11-22 (×11): qty 10

## 2015-11-22 MED ORDER — IPRATROPIUM-ALBUTEROL 0.5-2.5 (3) MG/3ML IN SOLN
3.0000 mL | Freq: Three times a day (TID) | RESPIRATORY_TRACT | Status: DC
  Administered 2015-11-23 – 2015-11-24 (×2): 3 mL via RESPIRATORY_TRACT
  Filled 2015-11-22 (×6): qty 3

## 2015-11-22 MED ORDER — METHYLPREDNISOLONE SODIUM SUCC 125 MG IJ SOLR
125.0000 mg | Freq: Once | INTRAMUSCULAR | Status: AC
  Administered 2015-11-22: 125 mg via INTRAVENOUS
  Filled 2015-11-22: qty 2

## 2015-11-22 MED ORDER — IPRATROPIUM-ALBUTEROL 0.5-2.5 (3) MG/3ML IN SOLN
3.0000 mL | Freq: Once | RESPIRATORY_TRACT | Status: AC
  Administered 2015-11-22: 3 mL via RESPIRATORY_TRACT
  Filled 2015-11-22: qty 3

## 2015-11-22 MED ORDER — SODIUM CHLORIDE 0.9 % IV SOLN: INTRAVENOUS | Status: AC

## 2015-11-22 MED ORDER — IPRATROPIUM-ALBUTEROL 0.5-2.5 (3) MG/3ML IN SOLN: 3.0000 mL | RESPIRATORY_TRACT | Status: DC | PRN

## 2015-11-22 MED ORDER — VANCOMYCIN HCL IN DEXTROSE 1-5 GM/200ML-% IV SOLN
1000.0000 mg | Freq: Once | INTRAVENOUS | Status: AC
  Administered 2015-11-22: 1000 mg via INTRAVENOUS
  Filled 2015-11-22: qty 200

## 2015-11-22 MED ORDER — INSULIN ASPART 100 UNIT/ML ~~LOC~~ SOLN
0.0000 [IU] | Freq: Three times a day (TID) | SUBCUTANEOUS | Status: DC
  Administered 2015-11-22: 7 [IU] via SUBCUTANEOUS
  Administered 2015-11-23 (×2): 2 [IU] via SUBCUTANEOUS
  Administered 2015-11-24: 3 [IU] via SUBCUTANEOUS
  Administered 2015-11-25: 1 [IU] via SUBCUTANEOUS
  Administered 2015-11-25: 2 [IU] via SUBCUTANEOUS

## 2015-11-22 NOTE — ED Notes (Signed)
Bipap off placed on 4 liters , pt awake and alert times 4 tolerating n/c

## 2015-11-22 NOTE — ED Notes (Signed)
CBG 251 

## 2015-11-22 NOTE — Clinical Social Work Note (Signed)
Clinical Social Work Assessment  Patient Details  Name: Benjamin Hodge MRN: [id number] Date of Birth: Nov 20, 1940  Date of referral:  11/22/15               Reason for consult:  Discharge Planning, Facility Placement                Permission sought to share information with:  Facility Medical sales representative, Family Supports Permission granted to share information::  No (patient's medical condition renders him unable to provide information)  Name::     Alphia Moh (son) [phone]  Agency::  [facility]  Relationship::     Contact Information:     Housing/Transportation Living arrangements for the past 2 months:  Skilled Building surveyor of Information:  Adult Children Patient Interpreter Needed:  None Criminal Activity/Legal Involvement Pertinent to Current Situation/Hospitalization:  No - Comment as needed Significant Relationships:  Adult Children Lives with:  Facility Resident Do you feel safe going back to the place where you live?  Yes Need for family participation in patient care:  Yes (Comment)  Care giving concerns: Patient is currently receiving care from [facility]. Patient's son would like for patient to return to SNF to receive the care that he needs.    Social Worker assessment / plan:  Patient is a 75 YO Caucasian male who presents to the ED after respiratory distress and hypoglycemia. CSW engaged with Patient's son via T/C as patient's medical condition renders him unable to provide information and son is not present at hospital at this time. Patient's son reports that he would like for Patient to return to [facility] and [facility] if discharged from the hospital. Patient's son feels that Patient was receiving excellent care with no concerns. Patient's son does not express any questions or concerns at this time. Patient will be followed by Inpatient CSW regarding disposition and transfer back to [facility] when medically  cleared and ready for discharge.   Employment status:  Retired Database administrator PT Recommendations:  Not assessed at this time Information / Referral to community resources:  Skilled Nursing Facility  Patient/Family's Response to care:  Patient's son reports that he is very Adult nurse of Patient's care. He denies any concerns or questions at this time.   Patient/Family's Understanding of and Emotional Response to Diagnosis, Current Treatment, and Prognosis:  Patient's son able to verbalize understanding of Patient's current diagnosis, treatment, and prognosis.   Emotional Assessment Appearance:  Appears stated age Attitude/Demeanor/Rapport:  Unable to Assess Affect (typically observed):  Unable to Assess Orientation:   (Unable to Assess ) Alcohol / Substance use:  Not Applicable Psych involvement (Current and /or in the community):  No (Comment)  Discharge Needs  Concerns to be addressed:  No discharge needs identified Readmission within the last 30 days:  Yes Current discharge risk:  None Barriers to Discharge:  Continued Medical Work up   [employer], LCSW 11/22/2015, 11:10 AM

## 2015-11-22 NOTE — ED Provider Notes (Signed)
CSN: [id number]     Arrival date & time 11/22/15  0407 History   First MD Initiated Contact with Patient 11/22/15 0424     Chief Complaint  Patient presents with  . Altered Mental Status  . Respiratory Distress     (Consider location/radiation/quality/duration/timing/severity/associated sxs/prior Treatment) HPI Comments: 75 year old male with extensive past medical history including CAD, COPD, CK D, type 2 diabetes mellitus, hypertension who presents with respiratory distress and hypoglycemia. History obtained by EMS as of the patient's respiratory distress. They were called to his nursing facility for hyperglycemia. The patient reportedly had a blood sugar in the 30s and was given glucagon by staff prior to EMS arrival. On their arrival, the patient was in severe respiratory distress with O2 sat of 51%. He was altered and hypotensive. They placed him on a nonrebreather and gave 500 ML's IV fluids during transport.  LEVEL 5 CAVEAT 2/2 RESPIRATORY DISTRESS AND AMS  Patient is a 75 y.o. male presenting with altered mental status. The history is provided by the patient.  Altered Mental Status   Past Medical History  Diagnosis Date  . CAD (coronary artery disease)     LAD 40-50% stenosis, first diagonal small 90% stenosis, circumflex 70% stenosis, OM 80% stenosis, right coronary artery 80-90% stenosis.  . Right upper lobe pneumonia 11/28/2008  . Anxiety 07/18/2008  . Irritable bowel syndrome 04/27/2008  . COPD (chronic obstructive pulmonary disease) (HCC) 02/27/2008  . B12 deficiency 02/16/2008    in 5/09: B12 262, MMA 1040  . Chronic diarrhea 01/01/2008    s/p EGD 10/06: H pylori + gastritis, duodenal biopsy normal (Dr. Elnoria Howard); s/p colonoscopy 10/06: hemorrhoids (Dr. Elnoria Howard); evaluated by Dr. Yancey Flemings  in 8/09: tissue transglutaminase AB negative, VIP normal, stool fat content normal, urine 5-HIAA normal; normal GES 11/09 (done for "fluctuating sugars")  . 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 Orthostatic hypotension 02/09/2007  . Hypertension 02/09/2007  . Spinal stenosis in cervical region 05/03    MRI  . Erectile dysfunction 09/27/2006    s/p penile implant  . Anemia, iron deficiency 09/27/2006    neg colonoscopy 2006 - Dr. Elnoria Howard; ferritin 152; hgb  15.7  on 07/07  . Hypothyroidism 09/27/2006    TSH 2.639  07/07  . Hypersomnia 09/27/2006    evaluated by Dr. Jetty Duhamel (5/08) and Dr. Vickey Huger; PSG 8/09: chronic delayed sleep phase syndrome (patient sleeps during the day and is awake at night), nocturnal myoclonus (eval for RLS and IDA suggested). Pt advised to change sleeping behavior  . Diabetes mellitus type II 09/07/1984    poorly controlled, complicated by peripheral neuropathy, microalbuminuria, mild non proliferative retinopathy, s/p DKA 7/05, on insulin pump x 4/09, started a pump vacation on 11/19/2008  . Hyperlipidemia   . 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 Hemorrhoids   . Chronic kidney disease   . Unspecified hereditary and idiopathic peripheral neuropathy   . Other chronic pain   . Other malaise and fatigue   . Depression   . Hypertrophy of prostate without urinary obstruction and other lower urinary tract symptoms (LUTS)   . Lumbago   . Heart attack (HCC) 03/2014    mild  . Renal mass, right: Incentendal finding MRI 10/02/2015 10/03/2015  . Hypogonadism in male 10/03/2015  . Asthma    Past Surgical History  Procedure Laterality Date  . Penile prosthesis implant    . Tonsillectomy    . Colonoscopy  06/25/2005    Dr. Jeani Hawking  . Left heart  catheterization with coronary angiogram [name] 03/13/2014    Procedure: LEFT HEART CATHETERIZATION WITH CORONARY ANGIOGRAM;  Surgeon: Peter M Swaziland, MD;  Location: [facility] CATH LAB;  Service: Cardiovascular;  Laterality: [name];  . Peripheral vascular catheterization [name] 02/19/2015    Procedure: Abdominal Aortogram;  Surgeon: Nada Libman, MD;  Location: [facility] INVASIVE CV LAB;  Service: Cardiovascular;   Laterality: [name];  . Peripheral vascular catheterization Left 02/19/2015    Procedure: Lower Extremity Angiography;  Surgeon: Nada Libman, MD;  Location: [facility] INVASIVE CV LAB;  Service: Cardiovascular;  Laterality: Left;   Family History  Problem Relation Age of Onset  . Bone cancer Mother   . Breast cancer Mother   . Cancer Mother   . Lung cancer Father   . Cancer Father   . Breast cancer Sister   . Cancer Sister   . Lung cancer Brother   . Cancer Sister     type unknown   Social History  Substance Use Topics  . Smoking status: Former Smoker    Types: Cigarettes    Quit date: 09/28/2004  . Smokeless tobacco: Never Used  . Alcohol Use: 0.0 oz/week    0 Standard drinks or equivalent per week     Comment: occ/social (moderate)    Review of Systems  Unable to perform ROS: Severe respiratory distress      Allergies  Ace inhibitors; Cymbalta; Lipitor; Bee venom; Glucerna; and Angiotensin receptor blockers  Home Medications   Prior to Admission medications   Medication Sig Start Date End Date Taking? Authorizing Provider  aspirin 81 MG tablet Take 81 mg by mouth at bedtime.    Yes Historical Provider, MD  budesonide-formoterol (SYMBICORT) 160-4.5 MCG/ACT inhaler Inhale 2 puffs into the lungs 2 (two) times daily. 10/03/15  Yes Rodolph Bong, MD  clopidogrel (PLAVIX) 75 MG tablet TAKE 1 TABLET BY MOUTH DAILY WITH BREAKFAST 06/11/15  Yes Kimber Relic, MD  diphenoxylate-atropine (LOMOTIL) 2.5-0.025 MG tablet Take 1 tablet by mouth every 6 (six) hours as needed for diarrhea or loose stools.   Yes Historical Provider, MD  escitalopram (LEXAPRO) 20 MG tablet TAKE ONE TABLET BY MOUTH ONE TIME DAILY 08/07/15  Yes Kimber Relic, MD  insulin aspart (NOVOLOG) 100 UNIT/ML injection Inject 4 Units into the skin 3 (three) times daily with meals. 11/04/15  Yes Zannie Cove, MD  insulin aspart (NOVOLOG) 100 UNIT/ML injection CBG 151-200= 2 units; 201- 250 = 3 units; 251-300 = 5 units;  301- 350 =7 units; 351- 400= 9 units; 401- 450 = 11 units. CBG > 450, Call MD.   Yes Historical Provider, MD  Insulin Glargine (TOUJEO SOLOSTAR) 300 UNIT/ML SOPN Inject 24 Units into the skin at bedtime.   Yes Historical Provider, MD  levETIRAcetam (KEPPRA) 1000 MG tablet Take 1 tablet (1,000 mg total) by mouth 2 (two) times daily. For seizures 10/22/15  Yes Charlestine Night, PA-C  levofloxacin (LEVAQUIN) 500 MG tablet Take 500 mg by mouth daily.   Yes Historical Provider, MD  levothyroxine (SYNTHROID, LEVOTHROID) 75 MCG tablet Take 1 tablet (75 mcg total) by mouth daily. 05/10/15  Yes Kimber Relic, MD  loperamide (IMODIUM) 2 MG capsule Take 1 capsule (2 mg total) by mouth every morning. 11/04/15  Yes Zannie Cove, MD  metoprolol tartrate (LOPRESSOR) 25 MG tablet Take 25 mg by mouth 2 (two) times daily.   Yes Historical Provider, MD  Multiple Vitamin (MULTIVITAMIN WITH MINERALS) TABS tablet Take 1 tablet by mouth daily. 02/22/15  Yes  Osvaldo Shipper, MD  pantoprazole (PROTONIX) 40 MG tablet TAKE ONE TABLET BY MOUTH ONE TIME DAILY 05/14/15  Yes Kimber Relic, MD  pravastatin (PRAVACHOL) 20 MG tablet Take 1 tablet (20 mg total) by mouth every evening. NEEDS APPOINTMENT FOR FUTURE REFILLS OR 90-DAY SUPPLY 09/07/15  Yes Rollene Rotunda, MD  pregabalin (LYRICA) 100 MG capsule Take 100 mg by mouth at bedtime.   Yes Historical Provider, MD  pregabalin (LYRICA) 50 MG capsule Take 50 mg by mouth daily.   Yes Historical Provider, MD  saccharomyces boulardii (FLORASTOR) 250 MG capsule Take 1 capsule (250 mg total) by mouth every evening. Patient taking differently: Take 250 mg by mouth 2 (two) times daily.  10/21/15  Yes Hollice Espy, MD  tiotropium (SPIRIVA) 18 MCG inhalation capsule Place 1 capsule (18 mcg total) into inhaler and inhale daily. 10/03/15  Yes Rodolph Bong, MD  vitamin B-12 (CYANOCOBALAMIN) 1000 MCG tablet Take 1,000 mcg by mouth daily.   Yes Historical Provider, MD   BP 124/48 mmHg   Pulse 99  Temp(Src) 99.1 F (37.3 C) (Rectal)  Resp 18  Ht 5\' 8"  (1.727 m)  Wt 128 lb (58.06 kg)  BMI 19.47 kg/m2  SpO2 97% Physical Exam  Constitutional: He appears distressed.  Pale, altered, in respiratory distress  HENT:  Head: Normocephalic and atraumatic.  Moist mucous membranes  Eyes: Conjunctivae are normal. Pupils are equal, round, and reactive to light.  Neck: Neck supple.  Cardiovascular: Normal rate, regular rhythm and normal heart sounds.   No murmur heard. Pulmonary/Chest: He is in respiratory distress.  Increased WOB w/ tachypnea, crackles bilaterally  Abdominal: Soft. Bowel sounds are normal. He exhibits no distension. There is no tenderness.  Musculoskeletal: He exhibits no edema.  Neurological:  Disoriented, moving all 4 ext  Skin: Skin is warm and dry. There is pallor.  Nursing note and vitals reviewed.   ED Course  .Critical Care Performed by: Laurence Spates Authorized by: Laurence Spates Total critical care time: 45 minutes Critical care time was exclusive of separately billable procedures and treating other patients. Critical care was necessary to treat or prevent imminent or life-threatening deterioration of the following conditions: respiratory failure. Critical care was time spent personally by me on the following activities: development of treatment plan with patient or surrogate, discussions with consultants, evaluation of patient's response to treatment, examination of patient, obtaining history from patient or surrogate, ordering and performing treatments and interventions, ordering and review of laboratory studies, ordering and review of radiographic studies, pulse oximetry, re-evaluation of patient's condition and review of old charts.   (including critical care time) Labs Review Labs Reviewed  COMPREHENSIVE METABOLIC PANEL - Abnormal; Notable for the following:    CO2 21 (*)    BUN 50 (*)    Creatinine, Ser 2.03 (*)    Calcium  8.8 (*)    Total Protein 6.3 (*)    Albumin 2.6 (*)    AST 61 (*)    Total Bilirubin 0.2 (*)    GFR calc non Af Amer 31 (*)    GFR calc Af Amer 36 (*)    All other components within normal limits  CBC WITH DIFFERENTIAL/PLATELET - Abnormal; Notable for the following:    WBC 3.9 (*)    RBC 3.11 (*)    Hemoglobin 9.4 (*)    HCT 28.7 (*)    Lymphs Abs 0.5 (*)    All other components within normal limits  URINALYSIS, ROUTINE W REFLEX  MICROSCOPIC (NOT AT [facility]) - Abnormal; Notable for the following:    Glucose, UA 100 (*)    All other components within normal limits  BRAIN NATRIURETIC PEPTIDE - Abnormal; Notable for the following:    B Natriuretic Peptide 262.2 (*)    All other components within normal limits  I-STAT ARTERIAL BLOOD GAS, ED - Abnormal; Notable for the following:    pH, Arterial 7.277 (*)    pO2, Arterial 61.0 (*)    Acid-base deficit 6.0 (*)    All other components within normal limits  CBG MONITORING, ED - Abnormal; Notable for the following:    Glucose-Capillary 110 (*)    All other components within normal limits  CBG MONITORING, ED - Abnormal; Notable for the following:    Glucose-Capillary 134 (*)    All other components within normal limits  CULTURE, BLOOD (ROUTINE X 2)  CULTURE, BLOOD (ROUTINE X 2)  URINE CULTURE  PROCALCITONIN  PROTIME-INR  APTT  TROPONIN I  TROPONIN I  TROPONIN I  CBC  CREATININE, SERUM  HEMOGLOBIN A1C  INFLUENZA PANEL BY PCR (TYPE A & B, H1N1)  I-STAT CG4 LACTIC ACID, ED  CBG MONITORING, ED  CBG MONITORING, ED  CBG MONITORING, ED  CBG MONITORING, ED  I-STAT CG4 LACTIC ACID, ED  CBG MONITORING, ED  CBG MONITORING, ED    Imaging Review Dg Chest Portable 1 View  11/22/2015  CLINICAL DATA:  Respiratory distress EXAM: PORTABLE CHEST 1 VIEW COMPARISON:  10/28/2015 FINDINGS: There is chronic elevation of the left diaphragm. Patchy lower lung opacity above baseline that is primarily concerning for pneumonia, especially medially on  the right. Normal heart size and aortic contours when allowing for technique. IMPRESSION: Bibasilar pneumonia. Electronically Signed   By: Marnee Spring M.D.   On: 11/22/2015 04:39   I have personally reviewed and evaluated these images and lab results as part of my medical decision-making.   EKG Interpretation   Date/Time:  Friday November 22 2015 04:13:55 EDT Ventricular Rate:  82 PR Interval:  165 QRS Duration: 99 QT Interval:  398 QTC Calculation: 465 R Axis:   76 Text Interpretation:  Sinus rhythm Biatrial enlargement Minimal ST  elevation, anterolateral leads Interpretation limited secondary to  artifact No significant change since last tracing Confirmed by Aleksey Newbern MD,  Jacque Byron [phone]) on 11/22/2015 4:56:10 AM     Medications  vancomycin (VANCOCIN) IVPB 1000 mg/200 mL premix (1,000 mg Intravenous New Bag/Given 11/22/15 0534)  methylPREDNISolone sodium succinate (SOLU-MEDROL) 125 mg/2 mL injection 125 mg (125 mg Intravenous Given 11/22/15 0422)  magnesium sulfate IVPB 2 g 50 mL (0 g Intravenous Stopped 11/22/15 0458)  ipratropium-albuterol (DUONEB) 0.5-2.5 (3) MG/3ML nebulizer solution 3 mL (3 mLs Nebulization Given 11/22/15 0450)  acetaminophen (TYLENOL) suppository 650 mg (650 mg Rectal Given 11/22/15 0428)  ceFEPIme (MAXIPIME) 2 g in dextrose 5 % 50 mL IVPB (0 g Intravenous Stopped 11/22/15 0514)  sodium chloride 0.9 % bolus 1,000 mL (1,000 mLs Intravenous New Bag/Given 11/22/15 0533)    MDM   Final diagnoses:  Respiratory failure with hypoxia, unspecified chronicity (HCC)  Dehydration  AKI (acute kidney injury) (HCC)   Patient presents from nursing facility with hypoglycemia and respiratory distress noted tonight. On arrival, he was in respiratory distress, altered and to. He was hypertensive at 84/58, initial O2 sats were in the 70s but quickly improved when patient was placed on BiPAP. Crackles heard bilaterally, no evidence of volume overload on exam. EMS reported that the  patient had had  a recent diagnosis of pneumonia. Given his hypotension, initiated a code sepsis with blood and urine cultures, lactate, and broad-spectrum antibiotics including vancomycin and cefepime. Gave the patient a small fluid bolus which improved his pressure; gave second liter of IV fluids. Because of hx of COPD, gave solumedrol, Magnesium, and duoneb.  Labs show BNP of 262, worsening creatinine at 2.03, ABG with pH 7.27, CO2 45, O2 61. On reexamination, the patient's blood pressure has improved significantly with hydration. I suspect sepsis due to pneumonia as his primary cause of respiratory failure. I discussed with critical care given the severity of his respiratory distress at presentation. They agreed with stepdown admission. I discussed with nurse practitioner Black, and patient admitted to Dr. Burna Sis service for further care.  Laurence Spates, MD 11/22/15 [phone]

## 2015-11-22 NOTE — ED Notes (Signed)
CBG is 134

## 2015-11-22 NOTE — ED Notes (Signed)
CBG 110. 

## 2015-11-22 NOTE — ED Notes (Signed)
CBG 158  

## 2015-11-22 NOTE — ED Notes (Signed)
Attempted report 

## 2015-11-22 NOTE — Progress Notes (Signed)
Patient is on 3lpm [place] with O2 sat of 99%. BIPAP is not needed at this time. RT will continue to monitor as needed.

## 2015-11-22 NOTE — Consult Note (Signed)
Consultation Note Date: 11/22/2015   Patient Name: Benjamin Hodge  DOB: 07/31/1941  MRN: [id number]  Age / Sex: 75 y.o., male  PCP: Margit Hanks, MD Referring Physician: Joseph Art, DO  Reason for Consultation: Establishing goals of care and Non pain symptom management    Clinical Assessment/Narrative: Patient is a 75 year old man with a past medical history of moderate to severe dementia recurrent pneumonia uncontrolled diabetes COPD chronic kidney disease coronary artery disease seizure disorder hypertension. This is his 6 admission in 6 months. His most recent admission was for pneumonia as well. He is currently living in a skilled nursing facility and was found this morning to have an altered mental status. His blood sugar was in the 30s. His oxygen saturations were 51% on room air. He was brought to the emergency room, and placed on BiPAP, and has since become more alert. He does try to answer some simple questions. His chest x-ray revealed bilateral pneumonia, pro-calcitonin level 10.81.  Contacts/Participants in Discussion: Primary Decision Maker: [name] Hutchinson   Relationship to Patient son HCPOA: yes    SUMMARY OF RECOMMENDATIONS Spoke to patient's son over the phone who is beginning to question his father's quality of life with recurrent infections and hospital admissions Plan is to meet patient's son on 11/23/2015 at 8:30 for further goals of care discussion. Continue to treat the treatable specifically antibiotics for bilateral pneumonia Agree with plan to titrate off of BiPAP to nasal cannula as soon as possible  Code Status/Advance Care Planning: DNR    Code Status Orders        Start     Ordered   11/22/15 0904  Do not attempt resuscitation (DNR)   Continuous    Question Answer Comment  In the event of cardiac or respiratory ARREST Do not call a "code blue"   In the event of  cardiac or respiratory ARREST Do not perform Intubation, CPR, defibrillation or ACLS   In the event of cardiac or respiratory ARREST Use medication by any route, position, wound care, and other measures to relive pain and suffering. May use oxygen, suction and manual treatment of airway obstruction as needed for comfort.      11/22/15 0903    Code Status History    Date Active Date Inactive Code Status Order ID Comments User Context   11/22/2015  7:26 AM 11/22/2015  9:03 AM Full Code [id number]  Gwenyth Bender, NP ED   10/29/2015  5:49 AM 11/04/2015  6:54 PM Full Code [id number]  Eduard Clos, MD Inpatient   10/19/2015 12:50 AM 10/21/2015  9:53 PM Full Code [id number]  Therisa Doyne, MD ED   09/27/2015  8:49 PM 10/03/2015  6:56 PM Full Code [id number]  Conley Canal, MD Inpatient   09/14/2015 12:07 AM 09/15/2015  8:26 PM Full Code [id number]  Briscoe Deutscher, MD ED   09/06/2015  8:40 PM 09/08/2015  3:17 PM Full Code [id number]  Edsel Petrin, DO Inpatient   02/19/2015  6:30 PM 02/22/2015  7:10 PM Full Code [id number]  Nada Libman, MD Inpatient   02/18/2015 10:10 PM 02/19/2015  6:30 PM Full Code [id number]  Rolly Salter, MD Inpatient   03/13/2014 11:20 AM 03/13/2014  6:19 PM Full Code [id number]  Peter M [place], MD Inpatient   03/10/2014  7:54 PM 03/13/2014 11:20 AM Full Code [id number]  Vassie Loll, MD Inpatient      Other Directives:None  Symptom Management:   Pain: Patient has neuropathy per chart  review. Continue Lyrica  Dyspnea: We'll speak to family about  medical management of shortness of breath going forward with recurrent pneumonia. Titrate off of BiPAP as soon as possible  Palliative Prophylaxis:   Aspiration, Bowel Regimen, Delirium Protocol, Frequent Pain Assessment and Turn Reposition  Psycho-social/Spiritual:  Support System: Adequate Desire for further Chaplaincy support:no Additional Recommendations: Grief/Bereavement Support  Prognosis: Would not be surprised if patient had a  prognosis of less than 6 months and will qualify for his hospice benefit in the setting of moderate to severe dementia with recurrent aspiration pneumonia, uncontrolled diabetes COPD chronic kidney disease as well as kidney mass.  Discharge Planning: TBD   Chief Complaint/ Primary Diagnoses: Present on Admission:  . Acute respiratory failure with hypoxia (HCC) . Hypothyroidism . Type 1 diabetes mellitus with neurological manifestations, uncontrolled (HCC) . Essential hypertension . CAD- moderate3V disease at cath 7/15- medical Rx . Anemia- chronic . COPD (chronic obstructive pulmonary disease) (HCC) . B12 deficiency . Acute kidney injury superimposed on chronic kidney disease (HCC) . Sepsis (HCC) . HCAP (healthcare-associated pneumonia) . Dementia without behavioral disturbance . Acute encephalopathy  I have reviewed the medical record, interviewed the patient and family, and examined the patient. The following aspects are pertinent.  Past Medical History  Diagnosis Date  . CAD (coronary artery disease)     LAD 40-50% stenosis, first diagonal small 90% stenosis, circumflex 70% stenosis, OM 80% stenosis, right coronary artery 80-90% stenosis.  . Right upper lobe pneumonia 11/28/2008  . Anxiety 07/18/2008  . Irritable bowel syndrome 04/27/2008  . COPD (chronic obstructive pulmonary disease) (HCC) 02/27/2008  . B12 deficiency 02/16/2008    in 5/09: B12 262, MMA 1040  . Chronic diarrhea 01/01/2008    s/p EGD 10/06: H pylori + gastritis, duodenal biopsy normal (Dr. Elnoria Howard); s/p colonoscopy 10/06: hemorrhoids (Dr. Elnoria Howard); evaluated by Dr. Yancey Flemings  in 8/09: tissue transglutaminase AB negative, VIP normal, stool fat content normal, urine 5-HIAA normal; normal GES 11/09 (done for "fluctuating sugars")  . 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 Orthostatic hypotension 02/09/2007  . Hypertension 02/09/2007  . Spinal stenosis in cervical region 05/03    MRI  .  Erectile dysfunction 09/27/2006    s/p penile implant  . Anemia, iron deficiency 09/27/2006    neg colonoscopy 2006 - Dr. Elnoria Howard; ferritin 152; hgb  15.7  on 07/07  . Hypothyroidism 09/27/2006    TSH 2.639  07/07  . Hypersomnia 09/27/2006    evaluated by Dr. Jetty Duhamel (5/08) and Dr. Vickey Huger; PSG 8/09: chronic delayed sleep phase syndrome (patient sleeps during the day and is awake at night), nocturnal myoclonus (eval for RLS and IDA suggested). Pt advised to change sleeping behavior  . Diabetes mellitus type II 09/07/1984    poorly controlled, complicated by peripheral neuropathy, microalbuminuria, mild non proliferative retinopathy, s/p DKA 7/05, on insulin pump x 4/09, started a pump vacation on 11/19/2008  . Hyperlipidemia   . 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 Hemorrhoids   . Chronic kidney disease   . Unspecified hereditary and idiopathic peripheral neuropathy   . Other chronic pain   . Other malaise and fatigue   . Depression   . Hypertrophy of prostate without urinary obstruction and other lower urinary tract symptoms (LUTS)   . Lumbago   . Heart attack (HCC) 03/2014    mild  . Renal mass, right: Incentendal finding MRI 10/02/2015 10/03/2015  . Hypogonadism in male 10/03/2015  . Asthma    Social History   Social  History  . Marital Status: Widowed    Spouse Name: [name]  . Number of Children: 1  . Years of Education: 15   Occupational History  . [employer]ER [employer]    Social History Main Topics  . Smoking status: Former Smoker    Types: Cigarettes    Quit date: 09/28/2004  . Smokeless tobacco: Never Used  . Alcohol Use: 0.0 oz/week    0 Standard drinks or equivalent per week     Comment: occ/social (moderate)  . Drug Use: No  . Sexual Activity: Not Asked   Other Topics Concern  . None   Social History Narrative   Patient is right handed, quit caffeinated beverages in 2005.   Works as a Museum/gallery conservator. One son. He is divorced.   Family History  Problem  Relation Age of Onset  . Bone cancer Mother   . Breast cancer Mother   . Cancer Mother   . Lung cancer Father   . Cancer Father   . Breast cancer Sister   . Cancer Sister   . Lung cancer Brother   . Cancer Sister     type unknown   Scheduled Meds: . heparin  5,000 Units Subcutaneous 3 times per day  . insulin aspart  0-9 Units Subcutaneous TID WC  . ipratropium-albuterol  3 mL Nebulization Q6H  . levothyroxine  37.5 mcg Intravenous Daily  . [START ON 11/23/2015] vancomycin  750 mg Intravenous Q24H   Continuous Infusions: . sodium chloride    . [START ON 11/23/2015] ceFEPime (MAXIPIME) IV    . ceFEPime (MAXIPIME) IV    . levETIRAcetam Stopped (11/22/15 1013)  . vancomycin     PRN Meds:.acetaminophen **OR** acetaminophen, bisacodyl, ipratropium-albuterol, morphine injection, ondansetron **OR** ondansetron (ZOFRAN) IV Medications Prior to Admission:  Prior to Admission medications   Medication Sig Start Date End Date Taking? Authorizing Provider  aspirin 81 MG tablet Take 81 mg by mouth at bedtime.    Yes Historical Provider, MD  budesonide-formoterol (SYMBICORT) 160-4.5 MCG/ACT inhaler Inhale 2 puffs into the lungs 2 (two) times daily. 10/03/15  Yes Rodolph Bong, MD  clopidogrel (PLAVIX) 75 MG tablet TAKE 1 TABLET BY MOUTH DAILY WITH BREAKFAST 06/11/15  Yes Kimber Relic, MD  diphenoxylate-atropine (LOMOTIL) 2.5-0.025 MG tablet Take 1 tablet by mouth every 6 (six) hours as needed for diarrhea or loose stools.   Yes Historical Provider, MD  escitalopram (LEXAPRO) 20 MG tablet TAKE ONE TABLET BY MOUTH ONE TIME DAILY 08/07/15  Yes Kimber Relic, MD  insulin aspart (NOVOLOG) 100 UNIT/ML injection Inject 4 Units into the skin 3 (three) times daily with meals. 11/04/15  Yes Zannie Cove, MD  insulin aspart (NOVOLOG) 100 UNIT/ML injection CBG 151-200= 2 units; 201- 250 = 3 units; 251-300 = 5 units; 301- 350 =7 units; 351- 400= 9 units; 401- 450 = 11 units. CBG > 450, Call MD.   Yes  Historical Provider, MD  Insulin Glargine (TOUJEO SOLOSTAR) 300 UNIT/ML SOPN Inject 24 Units into the skin at bedtime.   Yes Historical Provider, MD  levETIRAcetam (KEPPRA) 1000 MG tablet Take 1 tablet (1,000 mg total) by mouth 2 (two) times daily. For seizures 10/22/15  Yes Charlestine Night, PA-C  levofloxacin (LEVAQUIN) 500 MG tablet Take 500 mg by mouth daily.   Yes Historical Provider, MD  levothyroxine (SYNTHROID, LEVOTHROID) 75 MCG tablet Take 1 tablet (75 mcg total) by mouth daily. 05/10/15  Yes Kimber Relic, MD  loperamide (IMODIUM) 2 MG capsule  Take 1 capsule (2 mg total) by mouth every morning. 11/04/15  Yes Zannie Cove, MD  metoprolol tartrate (LOPRESSOR) 25 MG tablet Take 25 mg by mouth 2 (two) times daily.   Yes Historical Provider, MD  Multiple Vitamin (MULTIVITAMIN WITH MINERALS) TABS tablet Take 1 tablet by mouth daily. 02/22/15  Yes Osvaldo Shipper, MD  pantoprazole (PROTONIX) 40 MG tablet TAKE ONE TABLET BY MOUTH ONE TIME DAILY 05/14/15  Yes Kimber Relic, MD  pravastatin (PRAVACHOL) 20 MG tablet Take 1 tablet (20 mg total) by mouth every evening. NEEDS APPOINTMENT FOR FUTURE REFILLS OR 90-DAY SUPPLY 09/07/15  Yes Rollene Rotunda, MD  pregabalin (LYRICA) 100 MG capsule Take 100 mg by mouth at bedtime.   Yes Historical Provider, MD  pregabalin (LYRICA) 50 MG capsule Take 50 mg by mouth daily.   Yes Historical Provider, MD  saccharomyces boulardii (FLORASTOR) 250 MG capsule Take 1 capsule (250 mg total) by mouth every evening. Patient taking differently: Take 250 mg by mouth 2 (two) times daily.  10/21/15  Yes Hollice Espy, MD  tiotropium (SPIRIVA) 18 MCG inhalation capsule Place 1 capsule (18 mcg total) into inhaler and inhale daily. 10/03/15  Yes Rodolph Bong, MD  vitamin B-12 (CYANOCOBALAMIN) 1000 MCG tablet Take 1,000 mcg by mouth daily.   Yes Historical Provider, MD   Allergies  Allergen Reactions  . Ace Inhibitors Other (See Comments)    dizzy  . Cymbalta [Duloxetine  Hcl] Other (See Comments)    dizzy  . Lipitor [Atorvastatin] Other (See Comments)    Caused pain all over body.  . Bee Venom Swelling  . Glucerna [Nutritional Supplements] Diarrhea  . Angiotensin Receptor Blockers Other (See Comments)    unknown    Review of Systems  Unable to perform ROS   Physical Exam  Nursing note and vitals reviewed. Constitutional:  Frail older acutely ill man wearing bipap  HENT:  Head: Normocephalic and atraumatic.  Cardiovascular:  Tachy irrg  Respiratory:  Wearing bipap  Musculoskeletal: Normal range of motion.  Neurological:  Somnolent, but attempting to answer simple questions  Skin: Skin is warm and dry.    Vital Signs: BP 144/66 mmHg  Pulse 86  Temp(Src) 99.1 F (37.3 C) (Rectal)  Resp 16  Ht 5\' 8"  (1.727 m)  Wt 58.06 kg (128 lb)  BMI 19.47 kg/m2  SpO2 96%  SpO2: SpO2: 96 % O2 Device:SpO2: 96 % O2 Flow Rate: .O2 Flow Rate (L/min): 3 L/min  IO: Intake/output summary:  Intake/Output Summary (Last 24 hours) at 11/22/15 1329 Last data filed at 11/22/15 0514  Gross per 24 hour  Intake   1000 ml  Output      0 ml  Net   1000 ml    LBM:   Baseline Weight: Weight: 58.06 kg (128 lb) Most recent weight: Weight: 58.06 kg (128 lb)      Palliative Assessment/Data:  Flowsheet Rows        Most Recent Value   Intake Tab    Referral Department  Hospitalist   Unit at Time of Referral  ER   Palliative Care Primary Diagnosis  Sepsis/Infectious Disease   Date Notified  11/22/15   Palliative Care Type  New Palliative care   Reason for referral  End of Life Care Assistance   Date of Admission  11/22/15   Date first seen by Palliative Care  11/22/15   # of days Palliative referral response time  0 Day(s)   # of days IP prior  to Palliative referral  0   Clinical Assessment    Palliative Performance Scale Score  20%   Pain Max last 24 hours  Not able to report   Pain Min Last 24 hours  Not able to report   Dyspnea Max Last 24 Hours  Not  able to report   Dyspnea Min Last 24 hours  Not able to report   Nausea Max Last 24 Hours  Not able to report   Nausea Min Last 24 Hours  Not able to report   Anxiety Max Last 24 Hours  Not able to report   Anxiety Min Last 24 Hours  Not able to report   Other Max Last 24 Hours  Not able to report   Psychosocial & Spiritual Assessment    Palliative Care Outcomes    Palliative Care follow-up planned  Yes, Facility      Additional Data Reviewed:  CBC:    Component Value Date/Time   WBC 3.8* 11/22/2015 0738   WBC 5.0 06/28/2015 1621   WBC 5.3 11/29/2013 1507   HGB 8.1* 11/22/2015 0738   HGB 11.2* 11/29/2013 1507   HCT 25.5* 11/22/2015 0738   HCT 33.8* 06/28/2015 1621   HCT 34.4* 11/29/2013 1507   PLT 226 11/22/2015 0738   PLT 231 06/28/2015 1621   PLT 215 11/29/2013 1507   MCV 90.7 11/22/2015 0738   MCV 96 06/28/2015 1621   MCV 102.1* 11/29/2013 1507   NEUTROABS 3.2 11/22/2015 0441   NEUTROABS 3.1 06/28/2015 1621   NEUTROABS 3.6 11/29/2013 1507   LYMPHSABS 0.5* 11/22/2015 0441   LYMPHSABS 1.3 06/28/2015 1621   LYMPHSABS 0.9 11/29/2013 1507   MONOABS 0.2 11/22/2015 0441   MONOABS 0.5 11/29/2013 1507   EOSABS 0.0 11/22/2015 0441   EOSABS 0.2 06/28/2015 1621   EOSABS 0.3 11/29/2013 1507   BASOSABS 0.0 11/22/2015 0441   BASOSABS 0.0 06/28/2015 1621   BASOSABS 0.0 11/29/2013 1507   Comprehensive Metabolic Panel:    Component Value Date/Time   NA 143 11/22/2015 0441   NA 138 06/28/2015 1621   NA 143 11/29/2013 1507   K 4.3 11/22/2015 0441   K 5.0 11/29/2013 1507   CL 108 11/22/2015 0441   CL 108* 11/07/2012 1507   CO2 21* 11/22/2015 0441   CO2 26 11/29/2013 1507   BUN 50* 11/22/2015 0441   BUN 31* 06/28/2015 1621   BUN 20.9 11/29/2013 1507   CREATININE 2.02* 11/22/2015 0738   CREATININE 1.1 11/29/2013 1507   GLUCOSE 90 11/22/2015 0441   GLUCOSE 232* 06/28/2015 1621   GLUCOSE 156* 11/29/2013 1507   GLUCOSE 256* 11/07/2012 1507   CALCIUM 8.8* 11/22/2015 0441     CALCIUM 9.3 11/29/2013 1507   AST 61* 11/22/2015 0441   AST 22 11/29/2013 1507   ALT 39 11/22/2015 0441   ALT 20 11/29/2013 1507   ALKPHOS 81 11/22/2015 0441   ALKPHOS 65 11/29/2013 1507   BILITOT 0.2* 11/22/2015 0441   BILITOT 0.3 06/28/2015 1621   BILITOT 0.46 11/29/2013 1507   PROT 6.3* 11/22/2015 0441   PROT 6.6 06/28/2015 1621   PROT 6.6 11/29/2013 1507   ALBUMIN 2.6* 11/22/2015 0441   ALBUMIN 4.1 06/28/2015 1621   ALBUMIN 3.9 11/29/2013 1507     Time In: 1200 Time Out: 1300 Time Total: 60 min Greater than 50%  of this time was spent counseling and coordinating care related to the above assessment and plan. Staffed with Dr. Benjamine Mola  Signed by: Irean Hong,  NP  Irean Hong, NP  11/22/2015, 1:29 PM  Please contact Palliative Medicine Team phone at [phone] for questions and concerns.

## 2015-11-22 NOTE — ED Notes (Signed)
Patient from [facility], was found with sugar in the 30s.  Patient is there for diagnosis of PNA, was found to be hypotensive, hypoglycemic and with ALOC.  Patient had oxygen sat of 51% found by EMS.  Patient was given 2mg  of glucagon by SNF staff.  He was also given 500-600 ml of fluid during the night.

## 2015-11-22 NOTE — Progress Notes (Signed)
RT placed patient on BIPAP per MD verbal order. Patient placed on 16/8 and 60%. Patient is tolerating well at this time. RT will continue to monitor.

## 2015-11-22 NOTE — ED Notes (Signed)
CBG 216 

## 2015-11-22 NOTE — Progress Notes (Addendum)
Inpatient Diabetes Program Recommendations  AACE/ADA: New Consensus Statement on Inpatient Glycemic Control (2015)  Target Ranges:  Prepandial:   less than 140 mg/dL      Peak postprandial:   less than 180 mg/dL (1-2 hours)      Critically ill patients:  140 - 180 mg/dL   Consult for Uncontrolled DM Review of Glycemic Control  Diabetes history: DM 1 (sees Dr. Sharl Ma, Endocrinologist outpatient) Outpatient Diabetes medications: Toujeo 24 units, Novolog 0-11 units, Novolog 4 units TID meal coverage Current orders for Inpatient glycemic control: Novolog Sensitive TID  Inpatient Diabetes Program Recommendations: Insulin - Basal: Glucose increasing to 200's this am. Admitted for hypoglycemia. Has not started correction scale yet. Patient NPO.   Consider starting low dose basal insulin Lantus 10-12 units Q24hrs (0.2 units/kg). Consider Novolog 4 units TID with meals. Thanks,  Christena Deem RN, MSN, [facility] Inpatient Diabetes Coordinator Team Pager [phone] (8a-5p)

## 2015-11-22 NOTE — H&P (Signed)
[facility] History and Physical  SING HISE XBO:[id number] DOB: [birth year] DOA: 11/22/2015  Referring physician: little PCP: Margit Hanks, MD   Chief Complaint: respiratory distress/hypoglycemia  HPI: GRACESON HEBDA is a 75 y.o. male with past medical history with a past medical history that includes recent pneumonia, uncontrolled diabetes, COPD, chronic kidney disease, CAD, seizure disorder, hypertension since to the emergency department from facility with the chief complaint of respiratory distress and hypoglycemia. Initial evaluation reveals acute respiratory failure with hypoxia, sepsis related to healthcare associated pneumonia.  Information is obtained from the chart and the ED staff as patient's medical condition renders him unable to provide information. Patient was sent to the emergency department per EMS 1 facility evaluated patient and found his blood sugar to be in the 30s. He was given guaiac, by the staff prior to EMS arrival. EMS reports upon their arrival patient was in severe respiratory distress with oxygen saturation level 51%. In addition he was altered and hypotensive. They placed him on a nonrebreather and gave 500 mils by V. fib fluids during transport. Chart review indicates patient discharged to facility 18 days ago after hospitalization for healthcare associated pneumonia and uncontrolled diabetes. Chart review indicates 4 days ago patient had increased cough as uncontrolled blood sugars.  In the emergency department rectal temp 99.1 he's hypotensive demonstrates tachypnea respiratory distress with hypoxia, tachycardic.  No Dr. Clarene Duke reports she spoke to critical care regarding patient who opined step down admission appropriate S patient was responding to fluid resuscitation and BiPAP. They also availed themselves if needed should patient's condition worsen.   Review of Systems:  Unable to perform review of systems do to acute illness  Past  Medical History  Diagnosis Date  . CAD (coronary artery disease)     LAD 40-50% stenosis, first diagonal small 90% stenosis, circumflex 70% stenosis, OM 80% stenosis, right coronary artery 80-90% stenosis.  . Right upper lobe pneumonia 11/28/2008  . Anxiety 07/18/2008  . Irritable bowel syndrome 04/27/2008  . COPD (chronic obstructive pulmonary disease) (HCC) 02/27/2008  . B12 deficiency 02/16/2008    in 5/09: B12 262, MMA 1040  . Chronic diarrhea 01/01/2008    s/p EGD 10/06: H pylori + gastritis, duodenal biopsy normal (Dr. Elnoria Howard); s/p colonoscopy 10/06: hemorrhoids (Dr. Elnoria Howard); evaluated by Dr. Yancey Flemings  in 8/09: tissue transglutaminase AB negative, VIP normal, stool fat content normal, urine 5-HIAA normal; normal GES 11/09 (done for "fluctuating sugars")  . 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 Orthostatic hypotension 02/09/2007  . Hypertension 02/09/2007  . Spinal stenosis in cervical region 05/03    MRI  . Erectile dysfunction 09/27/2006    s/p penile implant  . Anemia, iron deficiency 09/27/2006    neg colonoscopy 2006 - Dr. Elnoria Howard; ferritin 152; hgb  15.7  on 07/07  . Hypothyroidism 09/27/2006    TSH 2.639  07/07  . Hypersomnia 09/27/2006    evaluated by Dr. Jetty Duhamel (5/08) and Dr. Vickey Huger; PSG 8/09: chronic delayed sleep phase syndrome (patient sleeps during the day and is awake at night), nocturnal myoclonus (eval for RLS and IDA suggested). Pt advised to change sleeping behavior  . Diabetes mellitus type II 09/07/1984    poorly controlled, complicated by peripheral neuropathy, microalbuminuria, mild non proliferative retinopathy, s/p DKA 7/05, on insulin pump x 4/09, started a pump vacation on 11/19/2008  . Hyperlipidemia   . 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 Hemorrhoids   . Chronic kidney disease   . Unspecified hereditary and idiopathic  peripheral neuropathy   . Other chronic pain   . Other malaise and fatigue   . Depression   . Hypertrophy of  prostate without urinary obstruction and other lower urinary tract symptoms (LUTS)   . Lumbago   . Heart attack (HCC) 03/2014    mild  . Renal mass, right: Incentendal finding MRI 10/02/2015 10/03/2015  . Hypogonadism in male 10/03/2015  . Asthma    Past Surgical History  Procedure Laterality Date  . Penile prosthesis implant    . Tonsillectomy    . Colonoscopy  06/25/2005    Dr. Jeani Hawking  . Left heart catheterization with coronary angiogram [name] 03/13/2014    Procedure: LEFT HEART CATHETERIZATION WITH CORONARY ANGIOGRAM;  Surgeon: Peter M Swaziland, MD;  Location: [facility] CATH LAB;  Service: Cardiovascular;  Laterality: [name];  . Peripheral vascular catheterization [name] 02/19/2015    Procedure: Abdominal Aortogram;  Surgeon: Nada Libman, MD;  Location: [facility] INVASIVE CV LAB;  Service: Cardiovascular;  Laterality: [name];  . Peripheral vascular catheterization Left 02/19/2015    Procedure: Lower Extremity Angiography;  Surgeon: Nada Libman, MD;  Location: [facility] INVASIVE CV LAB;  Service: Cardiovascular;  Laterality: Left;   Social History:  reports that he quit smoking about 11 years ago. His smoking use included Cigarettes. He has never used smokeless tobacco. He reports that he drinks alcohol. He reports that he does not use illicit drugs. Patient currently lives at a facility chart review indicates dementia and full assist with ADLs Allergies  Allergen Reactions  . Ace Inhibitors Other (See Comments)    dizzy  . Cymbalta [Duloxetine Hcl] Other (See Comments)    dizzy  . Lipitor [Atorvastatin] Other (See Comments)    Caused pain all over body.  . Bee Venom Swelling  . Glucerna [Nutritional Supplements] Diarrhea  . Angiotensin Receptor Blockers Other (See Comments)    unknown    Family History  Problem Relation Age of Onset  . Bone cancer Mother   . Breast cancer Mother   . Cancer Mother   . Lung cancer Father   . Cancer Father   . Breast cancer Sister   . Cancer Sister   . Lung cancer  Brother   . Cancer Sister     type unknown     Prior to Admission medications   Medication Sig Start Date End Date Taking? Authorizing Provider  aspirin 81 MG tablet Take 81 mg by mouth at bedtime.    Yes Historical Provider, MD  budesonide-formoterol (SYMBICORT) 160-4.5 MCG/ACT inhaler Inhale 2 puffs into the lungs 2 (two) times daily. 10/03/15  Yes Rodolph Bong, MD  clopidogrel (PLAVIX) 75 MG tablet TAKE 1 TABLET BY MOUTH DAILY WITH BREAKFAST 06/11/15  Yes Kimber Relic, MD  diphenoxylate-atropine (LOMOTIL) 2.5-0.025 MG tablet Take 1 tablet by mouth every 6 (six) hours as needed for diarrhea or loose stools.   Yes Historical Provider, MD  escitalopram (LEXAPRO) 20 MG tablet TAKE ONE TABLET BY MOUTH ONE TIME DAILY 08/07/15  Yes Kimber Relic, MD  insulin aspart (NOVOLOG) 100 UNIT/ML injection Inject 4 Units into the skin 3 (three) times daily with meals. 11/04/15  Yes Zannie Cove, MD  insulin aspart (NOVOLOG) 100 UNIT/ML injection CBG 151-200= 2 units; 201- 250 = 3 units; 251-300 = 5 units; 301- 350 =7 units; 351- 400= 9 units; 401- 450 = 11 units. CBG > 450, Call MD.   Yes Historical Provider, MD  Insulin Glargine (TOUJEO SOLOSTAR) 300 UNIT/ML SOPN Inject 24  Units into the skin at bedtime.   Yes Historical Provider, MD  levETIRAcetam (KEPPRA) 1000 MG tablet Take 1 tablet (1,000 mg total) by mouth 2 (two) times daily. For seizures 10/22/15  Yes Charlestine Night, PA-C  levofloxacin (LEVAQUIN) 500 MG tablet Take 500 mg by mouth daily.   Yes Historical Provider, MD  levothyroxine (SYNTHROID, LEVOTHROID) 75 MCG tablet Take 1 tablet (75 mcg total) by mouth daily. 05/10/15  Yes Kimber Relic, MD  loperamide (IMODIUM) 2 MG capsule Take 1 capsule (2 mg total) by mouth every morning. 11/04/15  Yes Zannie Cove, MD  metoprolol tartrate (LOPRESSOR) 25 MG tablet Take 25 mg by mouth 2 (two) times daily.   Yes Historical Provider, MD  Multiple Vitamin (MULTIVITAMIN WITH MINERALS) TABS tablet Take 1  tablet by mouth daily. 02/22/15  Yes Osvaldo Shipper, MD  pantoprazole (PROTONIX) 40 MG tablet TAKE ONE TABLET BY MOUTH ONE TIME DAILY 05/14/15  Yes Kimber Relic, MD  pravastatin (PRAVACHOL) 20 MG tablet Take 1 tablet (20 mg total) by mouth every evening. NEEDS APPOINTMENT FOR FUTURE REFILLS OR 90-DAY SUPPLY 09/07/15  Yes Rollene Rotunda, MD  pregabalin (LYRICA) 100 MG capsule Take 100 mg by mouth at bedtime.   Yes Historical Provider, MD  pregabalin (LYRICA) 50 MG capsule Take 50 mg by mouth daily.   Yes Historical Provider, MD  saccharomyces boulardii (FLORASTOR) 250 MG capsule Take 1 capsule (250 mg total) by mouth every evening. Patient taking differently: Take 250 mg by mouth 2 (two) times daily.  10/21/15  Yes Hollice Espy, MD  tiotropium (SPIRIVA) 18 MCG inhalation capsule Place 1 capsule (18 mcg total) into inhaler and inhale daily. 10/03/15  Yes Rodolph Bong, MD  vitamin B-12 (CYANOCOBALAMIN) 1000 MCG tablet Take 1,000 mcg by mouth daily.   Yes Historical Provider, MD   Physical Exam: Filed Vitals:   11/22/15 0630 11/22/15 0645 11/22/15 0700 11/22/15 0748  BP: 133/50 117/48 113/56   Pulse: 95 93 93   Temp:      TempSrc:      Resp: 31 23 18    Height:      Weight:      SpO2: 100% 100% 97% 98%    Wt Readings from Last 3 Encounters:  11/22/15 58.06 kg (128 lb)  11/18/15 58.06 kg (128 lb)  11/06/15 58.06 kg (128 lb)    General:  Appears Chronically ill thin and frail on BiPAP not uncomfortable Eyes: PERRL, normal lids, irises & conjunctiva ENT: grossly normal hearing, lips & tongue Neck: no LAD, masses or thyromegaly Cardiovascular: RRR, no m/r/g. No LE edema.  Respiratory: Normal work of breathing on BiPAP respirations quite coarse with diffuse rhonchi and wheezing Abdomen: soft, ntnd positive bowel sounds nontender Skin: no rash or induration seen on limited exam Musculoskeletal: grossly normal tone BUE/BLE Psychiatric: grossly normal mood and affect, speech fluent  and appropriate Neurologic: grossly non-focal. Slightly lethargic follow simple commands           Labs on Admission:  Basic Metabolic Panel:  Recent Labs Lab 11/22/15 0441  NA 143  K 4.3  CL 108  CO2 21*  GLUCOSE 90  BUN 50*  CREATININE 2.03*  CALCIUM 8.8*   Liver Function Tests:  Recent Labs Lab 11/22/15 0441  AST 61*  ALT 39  ALKPHOS 81  BILITOT 0.2*  PROT 6.3*  ALBUMIN 2.6*   No results for input(s): LIPASE, AMYLASE in the last 168 hours. No results for input(s): AMMONIA in the last 168  hours. CBC:  Recent Labs Lab 11/22/15 0441 11/22/15 0738  WBC 3.9* 3.8*  NEUTROABS 3.2  --   HGB 9.4* 8.1*  HCT 28.7* 25.5*  MCV 92.3 90.7  PLT 273 226   Cardiac Enzymes: No results for input(s): CKTOTAL, CKMB, CKMBINDEX, TROPONINI in the last 168 hours.  BNP (last 3 results)  Recent Labs  10/20/15 1043 11/22/15 0443  BNP 280.3* 262.2*    ProBNP (last 3 results) No results for input(s): PROBNP in the last 8760 hours.  CBG:  Recent Labs Lab 11/22/15 0429 11/22/15 0507 11/22/15 0558 11/22/15 0646  GLUCAP 86 77 110* 134*    Radiological Exams on Admission: Dg Chest Portable 1 View  11/22/2015  CLINICAL DATA:  Respiratory distress EXAM: PORTABLE CHEST 1 VIEW COMPARISON:  10/28/2015 FINDINGS: There is chronic elevation of the left diaphragm. Patchy lower lung opacity above baseline that is primarily concerning for pneumonia, especially medially on the right. Normal heart size and aortic contours when allowing for technique. IMPRESSION: Bibasilar pneumonia. Electronically Signed   By: Marnee Spring M.D.   On: 11/22/2015 04:39    EKG: Independently reviewed. Sinus rhythm Biatrial enlargement Minimal ST elevation, anterolateral leads. Artifact is no significant change from prior tracing  Assessment/Plan Principal Problem:   Acute respiratory failure with hypoxia (HCC) Active Problems:   Hypothyroidism   Type 1 diabetes mellitus with neurological  manifestations, uncontrolled (HCC)   Essential hypertension   COPD (chronic obstructive pulmonary disease) (HCC)   CAD- moderate3V disease at cath 7/15- medical Rx   Anemia- chronic   Seizures (HCC)   B12 deficiency   Dementia without behavioral disturbance   Acute encephalopathy   Acute kidney injury superimposed on chronic kidney disease (HCC)   Sepsis (HCC)   HCAP (healthcare-associated pneumonia)  #1. Acute respiratory failure with hypoxia related to sepsis secondary to healthcare associated pneumonia in the setting of COPD. Oxygen saturation level 50% on room air respiratory rate 31 heart rate 106 blood pressure 80/40 rectal temp 99.1 ABG reveals a pH of 7.27 PCO2 44.5 pO2 61.0, NP 286, reticulocyte acid within the range of normal. Analysis unremarkable.  Chest x-ray consistent with pneumonia. He received 2 L of normal saline, DuoNeb's, vancomycin and cefepime in the emergency department in addition he was placed on BiPAP. Stable on admission -Admit to step down -Continue BiPAP -Blood cultures -Continue vancomycin and cefepime -Provide Neb's every 4 hours -Vigorous IV fluids -Track lactic acid -Await urine culture -Repeat ABG as indicated -Wean BiPAP as able -Of note patient swallowing recently evaluated. Mild dysphagia noted. Regular solids and thin liquids recommended with swallowing strategies  #2 Sepsis secondary to healthcare associated pneumonia. See #1. Patient discharged 17 days ago after a hospitalization for pneumonia. Charge to nursing facility -See #1  #3. Healthcare associated pneumonia. See above  #4. Hypoglycemia. Facility reports CBG 30 at initial evaluation. He received glucagon. EMS reports CBG up to 70 at transport. Serum glucose admission 90. Chart review indicates diabetes very brittle. Appears noncompliant with endocrinology visits. Home regimen includes 3 units NovoLog 3 times a day with meals, sliding scale and 24 units Toujeo. Of note last hospitalization  patient treated for DKA -We'll hold home regimen -Provide sliding scale -Monitor CBG hourly until stabilized -Diabetes consult  #5. COPD. Not on home oxygen. Home meds to include nebulizers. -Nebulizers every 4 hours as noted above -See #1  #6. Acute encephalopathy in the setting of dementia. History of same. Likely related to acute illness. Moving all extremities spontaneously. If  no improvement as above results consider CT  #7. Acute on chronic kidney disease stage III. Creatinine 2.03 on admission. It appears his baseline 1.4. Likely  related to above. -Hold nephrotoxins -Monitor urine output -Continue IV fluids -Recheck in the morning  #8. Anemia. Likely related to chronic disease. Hemoglobin on admission 9.4. Chart review indicates this is baseline. No signs of active bleeding -Monitor  #9. Hypertension. Patient hypotensive on admission. See above. Home medications include Lopressor -We'll hold Lopressor for now -Monitor closely  #10. Peripheral vascular disease. Stable -Hold Plavix for now  11 hypothyroidism. Recent TSH 1.7. -Continue Synthroid IV  #12. Chronic diarrhea. Chart review indicates patient uses Imodium when necessary greater than 15 years. Stable at baseline  13. Renal mass right. Review indicates interval increase in size. -Follow-up with urology  #14. History of seizures. No documentation of recent seizure activity. -Continue Keppra     Code Status: DNR DVT Prophylaxis: Family Communication: son and daughter in law on phone Disposition Plan: back to facility  Time spent: 80 minutes  [facility] M [facility]

## 2015-11-22 NOTE — Progress Notes (Signed)
Pharmacy Antibiotic Note  Benjamin Hodge is a 75 y.o. male admitted on 11/22/2015 with pneumonia.  Pharmacy has been consulted for Vancomycin/Cefepime dosing.  WBC WNL, noted renal dysfunction, CXR with likely PNA, pt from nursing facility  Plan: -Vancomycin 750 mg IV q24h -Cefepime 2g IV q24h -Trend WBC, temp, renal function -Drug levels as indicated   Height: 5\' 8"  (172.7 cm) Weight: 128 lb (58.06 kg) IBW/kg (Calculated) : 68.4  Temp (24hrs), Avg:99.1 F (37.3 C), Min:99.1 F (37.3 C), Max:99.1 F (37.3 C)   Recent Labs Lab 11/22/15 0437 11/22/15 0441  WBC  --  3.9*  CREATININE  --  2.03*  LATICACIDVEN 1.36  --     Estimated Creatinine Clearance: 27 mL/min (by C-G formula based on Cr of 2.03).    Allergies  Allergen Reactions  . Ace Inhibitors Other (See Comments)    dizzy  . Cymbalta [Duloxetine Hcl] Other (See Comments)    dizzy  . Lipitor [Atorvastatin] Other (See Comments)    Caused pain all over body.  . Bee Venom Swelling  . Glucerna [Nutritional Supplements] Diarrhea  . Angiotensin Receptor Blockers Other (See Comments)    unknown     Abran Duke 11/22/2015 6:12 AM

## 2015-11-23 DIAGNOSIS — D638 Anemia in other chronic diseases classified elsewhere: Secondary | ICD-10-CM

## 2015-11-23 DIAGNOSIS — Z66 Do not resuscitate: Secondary | ICD-10-CM

## 2015-11-23 DIAGNOSIS — N189 Chronic kidney disease, unspecified: Secondary | ICD-10-CM

## 2015-11-23 DIAGNOSIS — N179 Acute kidney failure, unspecified: Secondary | ICD-10-CM

## 2015-11-23 DIAGNOSIS — J189 Pneumonia, unspecified organism: Secondary | ICD-10-CM

## 2015-11-23 DIAGNOSIS — R569 Unspecified convulsions: Secondary | ICD-10-CM

## 2015-11-23 DIAGNOSIS — G9340 Encephalopathy, unspecified: Secondary | ICD-10-CM

## 2015-11-23 DIAGNOSIS — J9601 Acute respiratory failure with hypoxia: Secondary | ICD-10-CM

## 2015-11-23 DIAGNOSIS — A419 Sepsis, unspecified organism: Principal | ICD-10-CM

## 2015-11-23 DIAGNOSIS — J449 Chronic obstructive pulmonary disease, unspecified: Secondary | ICD-10-CM

## 2015-11-23 DIAGNOSIS — I10 Essential (primary) hypertension: Secondary | ICD-10-CM

## 2015-11-23 LAB — COMPREHENSIVE METABOLIC PANEL
ALK PHOS: 78 U/L (ref 38–126)
ALT: 31 U/L (ref 17–63)
ANION GAP: 13 (ref 5–15)
AST: 39 U/L (ref 15–41)
Albumin: 2.2 g/dL — ABNORMAL LOW (ref 3.5–5.0)
BILIRUBIN TOTAL: 0.4 mg/dL (ref 0.3–1.2)
BUN: 32 mg/dL — ABNORMAL HIGH (ref 6–20)
CALCIUM: 8.7 mg/dL — AB (ref 8.9–10.3)
CO2: 23 mmol/L (ref 22–32)
Chloride: 106 mmol/L (ref 101–111)
Creatinine, Ser: 1.34 mg/dL — ABNORMAL HIGH (ref 0.61–1.24)
GFR, EST AFRICAN AMERICAN: 59 mL/min — AB (ref >60)
GFR, EST NON AFRICAN AMERICAN: 51 mL/min — AB (ref >60)
Glucose, Bld: 266 mg/dL — ABNORMAL HIGH (ref 65–99)
Potassium: 4.2 mmol/L (ref 3.5–5.1)
SODIUM: 142 mmol/L (ref 135–145)
TOTAL PROTEIN: 6 g/dL — AB (ref 6.5–8.1)

## 2015-11-23 LAB — CBC
HCT: 23.7 % — ABNORMAL LOW (ref 39.0–52.0)
HEMOGLOBIN: 7.8 g/dL — AB (ref 13.0–17.0)
MCH: 29.2 pg (ref 26.0–34.0)
MCHC: 32.9 g/dL (ref 30.0–36.0)
MCV: 88.8 fL (ref 78.0–100.0)
Platelets: 251 10*3/uL (ref 150–400)
RBC: 2.67 MIL/uL — AB (ref 4.22–5.81)
RDW: 13 % (ref 11.5–15.5)
WBC: 17.4 10*3/uL — AB (ref 4.0–10.5)

## 2015-11-23 LAB — GLUCOSE, CAPILLARY
GLUCOSE-CAPILLARY: 175 mg/dL — AB (ref 65–99)
GLUCOSE-CAPILLARY: 98 mg/dL (ref 65–99)
Glucose-Capillary: 169 mg/dL — ABNORMAL HIGH (ref 65–99)
Glucose-Capillary: 82 mg/dL (ref 65–99)

## 2015-11-23 LAB — URINE CULTURE: Culture: 1000

## 2015-11-23 LAB — HEMOGLOBIN A1C
HEMOGLOBIN A1C: 11 % — AB (ref 4.8–5.6)
MEAN PLASMA GLUCOSE: 269 mg/dL

## 2015-11-23 LAB — TROPONIN I: Troponin I: <0.03 ng/mL (ref <0.031)

## 2015-11-23 MED ORDER — MORPHINE SULFATE (CONCENTRATE) 10 MG/0.5ML PO SOLN
5.0000 mg | ORAL | Status: DC | PRN
  Administered 2015-11-24 – 2015-11-25 (×4): 5 mg via ORAL
  Filled 2015-11-23 (×4): qty 0.5

## 2015-11-23 MED ORDER — GUAIFENESIN ER 600 MG PO TB12
1200.0000 mg | ORAL_TABLET | Freq: Two times a day (BID) | ORAL | Status: DC
  Administered 2015-11-23 – 2015-11-24 (×2): 1200 mg via ORAL
  Filled 2015-11-23 (×5): qty 2

## 2015-11-23 MED ORDER — GUAIFENESIN-DM 100-10 MG/5ML PO SYRP: 5.0000 mL | ORAL_SOLUTION | ORAL | Status: DC | PRN

## 2015-11-23 NOTE — Progress Notes (Signed)
Daily Progress Note   Patient Name: Benjamin Hodge       Date: 11/23/2015 DOB: 08/18/1941  Age: 75 y.o. MRN#: [id number] Attending Physician: Verlee Monte, MD Primary Care Physician: Hennie Duos, MD Admit Date: 11/22/2015  Reason for Consultation/Follow-up: Establishing goals of care and Non pain symptom management  Benjamin Hodge looks remarkably better today. He is alert he has transitioned off of BiPAP onto 3 L nasal cannula. He is verbalizing being hungry. He is observed to choke and cough with eating, and states "it went down the wrong pipe". His blood sugars have been in the 200 range. There has been no seizure activity. His white blood cell count increased dramatically from yesterday to today, it is now 17.4 his blood pressure has been elevated. He denies being in any pain although he does say that he is short of breath. I met with his son Benjamin Hodge and his wife to discuss goals of care. Rishit was able to articulate that his father was not getting better and these recurrent trips to the hospital were not helping him. We did spend a lot of time talking about the pathophysiology related to the disease of dementia. We also talked about aspiration pneumonia. Family at this point would like to continue to treat the treatable such as IV antibiotics and when medically stabilized transfer back to the nursing home with hospice coming to the facility. He will be comfort care once he reaches the facility. A most form was completed.  Interval Events: Patient is more alert; off BiPAP Length of Stay: 1 day  Current Medications: Scheduled Meds:  . ceFEPime (MAXIPIME) IV  2 g Intravenous Q24H  . guaiFENesin  1,200 mg Oral BID  . heparin  5,000 Units Subcutaneous 3 times per day  . insulin  aspart  0-9 Units Subcutaneous TID WC  . insulin glargine  12 Units Subcutaneous Daily  . ipratropium-albuterol  3 mL Nebulization TID  . levETIRAcetam  1,000 mg Intravenous Q12H  . levothyroxine  37.5 mcg Intravenous Daily  . vancomycin  750 mg Intravenous Q24H    Continuous Infusions:    PRN Meds: acetaminophen **OR** acetaminophen, bisacodyl, guaiFENesin-dextromethorphan, ipratropium-albuterol, morphine injection, morphine CONCENTRATE, ondansetron **OR** ondansetron (ZOFRAN) IV  Physical Exam: Physical Exam  Constitutional:  Cachetic older man  HENT:  Head: Normocephalic  and atraumatic.  Pulmonary/Chest:  Mild increased work of breathing  Musculoskeletal: Normal range of motion.  Neurological: He is alert.  Confused, verbal but oriented to self  Skin: Skin is warm and dry.                Vital Signs: BP 128/57 mmHg  Pulse 84  Temp(Src) 97.5 F (36.4 C) (Oral)  Resp 19  Ht 5' 8"  (1.727 m)  Wt 58.1 kg (128 lb 1.4 oz)  BMI 19.48 kg/m2  SpO2 94% SpO2: SpO2: 94 % O2 Device: O2 Device: Nasal Cannula O2 Flow Rate: O2 Flow Rate (L/min): 3 L/min  Intake/output summary:  Intake/Output Summary (Last 24 hours) at 11/23/15 1221 Last data filed at 11/23/15 7915  Gross per 24 hour  Intake    300 ml  Output   1325 ml  Net  -1025 ml   LBM:   Baseline Weight: Weight: 58.06 kg (128 lb) Most recent weight: Weight: 58.1 kg (128 lb 1.4 oz)       Palliative Assessment/Data: Flowsheet Rows        Most Recent Value   Intake Tab    Referral Department  Hospitalist   Unit at Time of Referral  ER   Palliative Care Primary Diagnosis  Sepsis/Infectious Disease   Date Notified  11/22/15   Palliative Care Type  New Palliative care   Reason for referral  Clarify Goals of Care, Non-pain Symptom   Date of Admission  11/22/15   Date first seen by Palliative Care  11/22/15   # of days Palliative referral response time  0 Day(s)   # of days IP prior to Palliative referral  0    Clinical Assessment    Palliative Performance Scale Score  20%   Pain Max last 24 hours  Not able to report   Pain Min Last 24 hours  Not able to report   Dyspnea Max Last 24 Hours  Not able to report   Dyspnea Min Last 24 hours  Not able to report   Nausea Max Last 24 Hours  Not able to report   Nausea Min Last 24 Hours  Not able to report   Anxiety Max Last 24 Hours  Not able to report   Anxiety Min Last 24 Hours  Not able to report   Other Max Last 24 Hours  Not able to report   Psychosocial & Spiritual Assessment    Palliative Care Outcomes    Patient/Family meeting held?  Yes   Who was at the meeting?  son, DIL   Palliative Care Outcomes  Improved non-pain symptom therapy, Provided advance care planning, Clarified goals of care, Provided psychosocial or spiritual support, Counseled regarding hospice, Transitioned to hospice, Completed durable DNR   Palliative Care follow-up planned  Yes, Facility      Additional Data Reviewed: CBC    Component Value Date/Time   WBC 17.4* 11/23/2015 0309   WBC 5.0 06/28/2015 1621   WBC 5.3 11/29/2013 1507   RBC 2.67* 11/23/2015 0309   RBC 3.51* 06/28/2015 1621   RBC 3.37* 11/29/2013 1507   RBC 3.02* 11/26/2008 0727   HGB 7.8* 11/23/2015 0309   HGB 11.2* 11/29/2013 1507   HCT 23.7* 11/23/2015 0309   HCT 33.8* 06/28/2015 1621   HCT 34.4* 11/29/2013 1507   PLT 251 11/23/2015 0309   PLT 231 06/28/2015 1621   PLT 215 11/29/2013 1507   MCV 88.8 11/23/2015 0309   MCV 96 06/28/2015 1621  MCV 102.1* 11/29/2013 1507   MCH 29.2 11/23/2015 0309   MCH 31.3 06/28/2015 1621   MCH 33.2 11/29/2013 1507   MCHC 32.9 11/23/2015 0309   MCHC 32.5 06/28/2015 1621   MCHC 32.5 11/29/2013 1507   RDW 13.0 11/23/2015 0309   RDW 14.6 06/28/2015 1621   RDW 13.4 11/29/2013 1507   LYMPHSABS 0.5* 11/22/2015 0441   LYMPHSABS 1.3 06/28/2015 1621   LYMPHSABS 0.9 11/29/2013 1507   MONOABS 0.2 11/22/2015 0441   MONOABS 0.5 11/29/2013 1507   EOSABS 0.0  11/22/2015 0441   EOSABS 0.2 06/28/2015 1621   EOSABS 0.3 11/29/2013 1507   BASOSABS 0.0 11/22/2015 0441   BASOSABS 0.0 06/28/2015 1621   BASOSABS 0.0 11/29/2013 1507    CMP     Component Value Date/Time   NA 142 11/23/2015 0309   NA 138 06/28/2015 1621   NA 143 11/29/2013 1507   K 4.2 11/23/2015 0309   K 5.0 11/29/2013 1507   CL 106 11/23/2015 0309   CL 108* 11/07/2012 1507   CO2 23 11/23/2015 0309   CO2 26 11/29/2013 1507   GLUCOSE 266* 11/23/2015 0309   GLUCOSE 232* 06/28/2015 1621   GLUCOSE 156* 11/29/2013 1507   GLUCOSE 256* 11/07/2012 1507   BUN 32* 11/23/2015 0309   BUN 31* 06/28/2015 1621   BUN 20.9 11/29/2013 1507   CREATININE 1.34* 11/23/2015 0309   CREATININE 1.1 11/29/2013 1507   CALCIUM 8.7* 11/23/2015 0309   CALCIUM 9.3 11/29/2013 1507   PROT 6.0* 11/23/2015 0309   PROT 6.6 06/28/2015 1621   PROT 6.6 11/29/2013 1507   ALBUMIN 2.2* 11/23/2015 0309   ALBUMIN 4.1 06/28/2015 1621   ALBUMIN 3.9 11/29/2013 1507   AST 39 11/23/2015 0309   AST 22 11/29/2013 1507   ALT 31 11/23/2015 0309   ALT 20 11/29/2013 1507   ALKPHOS 78 11/23/2015 0309   ALKPHOS 65 11/29/2013 1507   BILITOT 0.4 11/23/2015 0309   BILITOT 0.3 06/28/2015 1621   BILITOT 0.46 11/29/2013 1507   GFRNONAA 51* 11/23/2015 0309   GFRAA 59* 11/23/2015 0309       Problem List:  Patient Active Problem List   Diagnosis Date Noted  . Acute respiratory failure with hypoxia ([place]) 11/22/2015  . Acute kidney injury superimposed on chronic kidney disease ([place]) 11/22/2015  . Sepsis ([place]) 11/22/2015  . HCAP (healthcare-associated pneumonia) 11/22/2015  . DNR (do not resuscitate) 11/22/2015  . Palliative care encounter   . Sick 11/18/2015  . Depression 11/07/2015  . Hypotension 10/29/2015  . Slurred speech   . Hyperglycemia 10/18/2015  . Hyperglycemic crisis in diabetes mellitus ([place]) 10/18/2015  . Aspiration pneumonia ([place]) 10/18/2015  . Acute encephalopathy   . Acute hyperglycemia   .  Diarrhea 10/16/2015  . Renal mass, right: Incentendal finding MRI 10/02/2015 10/03/2015  . Hypogonadism in male 10/03/2015  . Dehydration   . Hyperglycemia due to type 1 diabetes mellitus ([place]) 10/02/2015  . Encephalopathy, metabolic   . Heme positive stool 10/01/2015  . Elevated CEA 09/30/2015  . Elevated CA 19-9 level 09/30/2015  . CKD (chronic kidney disease), stage II 09/30/2015  . COPD with exacerbation ([place]) 09/27/2015  . Narcotic dependency, continuous ([place]) 09/27/2015  . AKI (acute kidney injury) ([place]) 09/27/2015  . DKA (diabetic ketoacidosis) ([place]) 09/14/2015  . Narcotic abuse   . Hyperglycemia without ketosis 09/13/2015  . DKA (diabetic ketoacidoses) ([place]) 09/06/2015  . Partial symptomatic epilepsy with complex partial seizures, not intractable, without status epilepticus ([place]) 07/16/2015  .  Dementia without behavioral disturbance 07/16/2015  . Hypersomnia with long sleep time, idiopathic 07/16/2015  . Pruritus of scalp 05/04/2015  . Loose stools 05/04/2015  . Absence attack ([place]) 05/04/2015  . B12 deficiency 05/04/2015  . Seizures ([place]) 04/10/2015  . Cerebrovascular disease 04/10/2015  . PVD (peripheral vascular disease) ([place]) 02/18/2015  . Testosterone deficiency 01/02/2015  . Diabetes mellitus type 1 with peripheral artery disease ([place]) 01/02/2015  . Right-sided chest wall pain 11/27/2014  . Ecchymosis 08/29/2014  . Anemia- chronic 04/03/2014  . CAD- moderate3V disease at cath 7/15- medical Rx 03/28/2014  . Dyslipidemia 03/13/2014  . Non-STEMI (non-ST elevated myocardial infarction) ([place]) 03/10/2014  . 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 Low back pain potentially associated with spinal stenosis 02/26/2014  . ADHD, adult residual type 05/31/2013  . Elevated AST (SGOT) 01/23/2013  . SHOULDER PAIN, LEFT 03/04/2009  . ANXIETY 07/18/2008  . IRRITABLE BOWEL SYNDROME 04/27/2008  . NOCTURIA 03/13/2008  . COPD (chronic  obstructive pulmonary disease) ([place]) 02/27/2008  . Mild nonproliferative diabetic retinopathy ([place]) 11/18/2007  . LEG PAIN, RIGHT 09/29/2007  . POLYNEUROPATHY IN DIABETES 02/09/2007  . HYPOTENSION, ORTHOSTATIC 02/09/2007  . Hypothyroidism 09/27/2006  . Type 1 diabetes mellitus with neurological manifestations, uncontrolled ([place]) 09/27/2006  . ERECTILE DYSFUNCTION 09/27/2006  . Essential hypertension 09/27/2006  . SPINAL STENOSIS, CERVICAL 09/27/2006  . Hypersomnia 09/27/2006     Palliative Care Assessment & Plan    1.Code Status:  DNR    Code Status Orders        Start     Ordered   11/22/15 0904  Do not attempt resuscitation (DNR)   Continuous    Question Answer Comment  In the event of cardiac or respiratory ARREST Do not call a "code blue"   In the event of cardiac or respiratory ARREST Do not perform Intubation, CPR, defibrillation or ACLS   In the event of cardiac or respiratory ARREST Use medication by any route, position, wound care, and other measures to relive pain and suffering. May use oxygen, suction and manual treatment of airway obstruction as needed for comfort.      11/22/15 0903    Code Status History    Date Active Date Inactive Code Status Order ID Comments User Context   11/22/2015  7:26 AM 11/22/2015  9:03 AM Full Code [id number]  Radene Gunning, NP ED   10/29/2015  5:49 AM 11/04/2015  6:54 PM Full Code [id number]  Rise Patience, MD Inpatient   10/19/2015 12:50 AM 10/21/2015  9:53 PM Full Code [id number]  Toy Baker, MD ED   09/27/2015  8:49 PM 10/03/2015  6:56 PM Full Code [id number]  Nat Math, MD Inpatient   09/14/2015 12:07 AM 09/15/2015  8:26 PM Full Code [id number]  Vianne Bulls, MD ED   09/06/2015  8:40 PM 09/08/2015  3:17 PM Full Code [id number]  Cristal Ford, DO Inpatient   02/19/2015  6:30 PM 02/22/2015  7:10 PM Full Code [id number]  Serafina Mitchell, MD Inpatient   02/18/2015 10:10 PM 02/19/2015  6:30 PM Full Code [id number]  Lavina Hamman, MD  Inpatient   03/13/2014 11:20 AM 03/13/2014  6:19 PM Full Code [id number]  Peter M Martinique, MD Inpatient   03/10/2014  7:54 PM 03/13/2014 11:20 AM Full Code [id number]  Barton Dubois, MD Inpatient    Advance Directive Documentation        Most Recent Value   Type of Advance Directive  Healthcare Power of  Attorney   Pre-existing out of facility DNR order (yellow form or pink MOST form)     "MOST" Form in Place?         2. Goals of Care/Additional Recommendations:  Continue to treat the treatable in the hospital such as IV antibiotics as indicated  Patient will return to the nursing home,[facility]. He will be comfort care once he returns to the facility  A most form was completed.  Patient would not want a PEG tube  IV fluids and antibiotics were referenced on the most form to be provided only if they could be given in a skilled nursing facility. The goal is not to return to the hospital  Do not restart BiPAP  Desire for further Chaplaincy support:no  Psycho-social Needs: Grief/Bereavement Support  3. Symptom Management:      1. Pain: Per report patient has a long history of opioid usage for chronic back pain and neck pain but was weaned off of this approximately 3 years ago. He is denying any pain currently      2. Dyspnea: We'll add low-dose morphine concentrate 5 mg every 4 hours when necessary for shortness of breath. This patient was on Vicodin for a number of years, given his level of dementia currently, do not feel that this would necessarily trigger resumption of chronic opioid usage for pain. Skilled nursing facility to monitor patient's usage  4. Palliative Prophylaxis:   Aspiration, Bowel Regimen, Delirium Protocol, Frequent Pain Assessment and Turn Reposition  5. Prognosis: Patient has been hospitalized 6 times in the last 6 months with recurrent aspiration pneumonia. He has moderate to severe dementia despite maintaining significant verbal ability. Feel that he would meet  hospice criteria based on dysphagia secondary to his dementia and ongoing aspiration pneumonia   6. Discharge Planning:  [place] with Hospice   Care plan was discussed with attending  Thank you for allowing the Palliative Medicine Team to assist in the care of this patient.   Time In: 0900 Time Out: 1015 Total Time 75 min Prolonged Time Billed  no         Dory Horn, NP  11/23/2015, 12:21 PM  Please contact Palliative Medicine Team phone at [phone] for questions and concerns.

## 2015-11-23 NOTE — Discharge Summary (Deleted)
[facility] PROGRESS NOTE   Benjamin Hodge ZOX:[id number] DOB: Jun 08, 1941 DOA: 11/22/2015 PCP: Margit Hanks, MD  HPI/Subjective: Seen while he was eating, patient is very frustrated about how easily he gets pneumonia.  Assessment/Plan: Principal Problem:   Acute respiratory failure with hypoxia (HCC) Active Problems:   Hypothyroidism   Type 1 diabetes mellitus with neurological manifestations, uncontrolled (HCC)   Essential hypertension   COPD (chronic obstructive pulmonary disease) (HCC)   CAD- moderate3V disease at cath 7/15- medical Rx   Anemia- chronic   Seizures (HCC)   B12 deficiency   Dementia without behavioral disturbance   Acute encephalopathy   Acute kidney injury superimposed on chronic kidney disease (HCC)   Sepsis (HCC)   HCAP (healthcare-associated pneumonia)   DNR (do not resuscitate)   Acute respiratory failure with hypoxia Secondary to healthcare associated pneumonia in the setting of COPD.  Oxygen saturation level 50% on room air respiratory rate 31 on admission. ABG reveals a pH 7.27, PCO2 44.5 pO2 61. Placed on BiPAP, currently on 2 L of oxygen. Wean oxygen as needed.  Sepsis secondary to healthcare associated pneumonia See #1. Patient discharged 17 days ago after a hospitalization for pneumonia. pH is 7.27 on admission. Started on broad-spectrum antibiotics with vancomycin and cefepime. Aggressive hydration with IV fluids.  Healthcare associated pneumonia Chest x-ray is consistent with pneumonia, patient has hypoxic respiratory failure with O2 sats 50%. Started on vancomycin and cefepime. Continue bronchodilators, mucolytics, antitussives and oxygen as needed.  Hypoglycemia Facility reports CBG 30 at initial evaluation. 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 Home regimen includes 3 units NovoLog 3 times a day with meals, sliding scale and 24 units Toujeo.  Of note last hospitalization patient treated for DKA Restart home  regimen, follow closely.  COPD Not on home oxygen. Home meds to include nebulizers. Nebulizers every 4 hours as noted above  Acute encephalopathy in the setting of dementia.  History of same. Likely related to acute illness. Moving all extremities spontaneously. If no improvement as above results consider CT  Acute on chronic kidney disease stage III Creatinine 2.03 on admission. It appears his baseline 1.4. Likely related to above. This is back to baseline of 1.34.   Anemia Likely related to chronic disease. Hemoglobin on admission 9.4. Around baseline. Hemoglobin dropped to 7.8 with aggressive IV fluids hydration, likely secondary to hemodilution. If hemoglobin drops further we will transfuse blood.  Hypertension Patient hypotensive on admission. See above. Home medications include Lopressor  Peripheral vascular disease. Stable -Hold Plavix for now  Hypothyroidism. Recent TSH 1.7. -Continue Synthroid IV  Chronic diarrhea. Chart review indicates patient uses Imodium when necessary greater than 15 years. Stable at baseline  Renal mass right. Review indicates interval increase in size. -Follow-up with urology  History of seizures. No documentation of recent seizure activity. -Continue Keppra  Code Status: DNR Family Communication: Plan discussed with the patient. Disposition Plan: Remains inpatient, transfer to telemetry. Diet: DIET DYS 3 Room service appropriate?: Yes; Fluid consistency:: Thin  Consultants:  Palliative medicine team  Procedures:  None  Antibiotics:  Vancomycin and Zosyn (indicate start date, and stop date if known)   Objective: Filed Vitals:   11/23/15 0700 11/23/15 0804  BP: 179/141   Pulse:    Temp:  97.5 F (36.4 C)  Resp: 19     Intake/Output Summary (Last 24 hours) at 11/23/15 1124 Last data filed at 11/23/15 0700  Gross per 24 hour  Intake    200 ml  Output   1325 ml  Net  -1125 ml   Filed Weights   11/22/15 0425 11/22/15  1700 11/23/15 0500  Weight: 58.06 kg (128 lb) 58.1 kg (128 lb 1.4 oz) 58.1 kg (128 lb 1.4 oz)    Exam: General: Alert and awake, oriented x3, not in any acute distress. HEENT: anicteric sclera, pupils reactive to light and accommodation, EOMI CVS: S1-S2 clear, no murmur rubs or gallops Chest: clear to auscultation bilaterally, no wheezing, rales or rhonchi Abdomen: soft nontender, nondistended, normal bowel sounds, no organomegaly Extremities: no cyanosis, clubbing or edema noted bilaterally Neuro: Cranial nerves II-XII intact, no focal neurological deficits  Data Reviewed: Basic Metabolic Panel:  Recent Labs Lab 11/22/15 0441 11/22/15 0738 11/23/15 0309  NA 143  --  142  K 4.3  --  4.2  CL 108  --  106  CO2 21*  --  23  GLUCOSE 90  --  266*  BUN 50*  --  32*  CREATININE 2.03* 2.02* 1.34*  CALCIUM 8.8*  --  8.7*   Liver Function Tests:  Recent Labs Lab 11/22/15 0441 11/23/15 0309  AST 61* 39  ALT 39 31  ALKPHOS 81 78  BILITOT 0.2* 0.4  PROT 6.3* 6.0*  ALBUMIN 2.6* 2.2*   No results for input(s): LIPASE, AMYLASE in the last 168 hours. No results for input(s): AMMONIA in the last 168 hours. CBC:  Recent Labs Lab 11/22/15 0441 11/22/15 0738 11/23/15 0309  WBC 3.9* 3.8* 17.4*  NEUTROABS 3.2  --   --   HGB 9.4* 8.1* 7.8*  HCT 28.7* 25.5* 23.7*  MCV 92.3 90.7 88.8  PLT 273 226 251   Cardiac Enzymes:  Recent Labs Lab 11/22/15 0738 11/22/15 1706 11/22/15 2230  TROPONINI <0.03 0.05* <0.03   BNP (last 3 results)  Recent Labs  10/20/15 1043 11/22/15 0443  BNP 280.3* 262.2*    ProBNP (last 3 results) No results for input(s): PROBNP in the last 8760 hours.  CBG:  Recent Labs Lab 11/22/15 1049 11/22/15 1419 11/22/15 1656 11/22/15 2110 11/23/15 0803  GLUCAP 216* 251* 301* 215* 175*    Micro Recent Results (from the past 240 hour(s))  Blood Culture (routine x 2)     Status: None (Preliminary result)   Collection Time: 11/22/15  4:26 AM    Result Value Ref Range Status   Specimen Description BLOOD LEFT FOREARM  Final   Special Requests BOTTLES DRAWN AEROBIC ONLY  Final   Culture PENDING  Incomplete   Report Status PENDING  Incomplete  MRSA PCR Screening     Status: None   Collection Time: 11/22/15  4:49 PM  Result Value Ref Range Status   MRSA by PCR NEGATIVE NEGATIVE Final    Comment:        The GeneXpert MRSA Assay (FDA approved for NASAL specimens only), is one component of a comprehensive MRSA colonization surveillance program. It is not intended to diagnose MRSA infection nor to guide or monitor treatment for MRSA infections.      Studies: Dg Chest Portable 1 View  11/22/2015  CLINICAL DATA:  Respiratory distress EXAM: PORTABLE CHEST 1 VIEW COMPARISON:  10/28/2015 FINDINGS: There is chronic elevation of the left diaphragm. Patchy lower lung opacity above baseline that is primarily concerning for pneumonia, especially medially on the right. Normal heart size and aortic contours when allowing for technique. IMPRESSION: Bibasilar pneumonia. Electronically Signed   By: Marnee Spring M.D.   On: 11/22/2015 04:39    Scheduled  Meds: . ceFEPime (MAXIPIME) IV  2 g Intravenous Q24H  . heparin  5,000 Units Subcutaneous 3 times per day  . insulin aspart  0-9 Units Subcutaneous TID WC  . insulin glargine  12 Units Subcutaneous Daily  . ipratropium-albuterol  3 mL Nebulization TID  . levETIRAcetam  1,000 mg Intravenous Q12H  . levothyroxine  37.5 mcg Intravenous Daily  . vancomycin  750 mg Intravenous Q24H   Continuous Infusions:      Time spent: 35 minutes    [facility] A  [facility] Pager [phone] If 7PM-7AM, please contact night-coverage at [email or web address], password [facility] 11/23/2015, 11:24 AM  LOS: 1 day

## 2015-11-23 NOTE — Progress Notes (Signed)
Report called to [facility] RN 5N. Patient Son called and updated on patient new location.

## 2015-11-23 NOTE — Progress Notes (Signed)
Pt arrived to the unit as transfer from 2H; pt A&O x4; pt oriented to the unit and room; pt educated on fall/safety precautions and prevention; pt telemetry dc per report; pt VSS; pt condom cath remains on and intact. Pt stable during shift and report off to oncoming RN. Dionne Bucy RN

## 2015-11-24 DIAGNOSIS — F0390 Unspecified dementia without behavioral disturbance: Secondary | ICD-10-CM

## 2015-11-24 LAB — GLUCOSE, CAPILLARY
GLUCOSE-CAPILLARY: 110 mg/dL — AB (ref 65–99)
Glucose-Capillary: 174 mg/dL — ABNORMAL HIGH (ref 65–99)
Glucose-Capillary: 246 mg/dL — ABNORMAL HIGH (ref 65–99)
Glucose-Capillary: 27 mg/dL — CL (ref 65–99)
Glucose-Capillary: 91 mg/dL (ref 65–99)

## 2015-11-24 LAB — BASIC METABOLIC PANEL
Anion gap: 13 (ref 5–15)
BUN: 19 mg/dL (ref 6–20)
CHLORIDE: 104 mmol/L (ref 101–111)
CO2: 28 mmol/L (ref 22–32)
CREATININE: 0.94 mg/dL (ref 0.61–1.24)
Calcium: 9.3 mg/dL (ref 8.9–10.3)
GFR calc Af Amer: >60 mL/min (ref >60)
GFR calc non Af Amer: >60 mL/min (ref >60)
GLUCOSE: 35 mg/dL — AB (ref 65–99)
Potassium: 3.2 mmol/L — ABNORMAL LOW (ref 3.5–5.1)
Sodium: 145 mmol/L (ref 135–145)

## 2015-11-24 LAB — CBC
HCT: 26.5 % — ABNORMAL LOW (ref 39.0–52.0)
Hemoglobin: 8.6 g/dL — ABNORMAL LOW (ref 13.0–17.0)
MCH: 28.9 pg (ref 26.0–34.0)
MCHC: 32.5 g/dL (ref 30.0–36.0)
MCV: 88.9 fL (ref 78.0–100.0)
PLATELETS: 323 10*3/uL (ref 150–400)
RBC: 2.98 MIL/uL — ABNORMAL LOW (ref 4.22–5.81)
RDW: 13.2 % (ref 11.5–15.5)
WBC: 17.1 10*3/uL — ABNORMAL HIGH (ref 4.0–10.5)

## 2015-11-24 MED ORDER — DEXTROSE 50 % IV SOLN
INTRAVENOUS | Status: AC
Start: 2015-11-24 — End: 2015-11-24
  Administered 2015-11-24: 50 mL
  Filled 2015-11-24: qty 50

## 2015-11-24 MED ORDER — INSULIN GLARGINE 100 UNIT/ML ~~LOC~~ SOLN
5.0000 [IU] | Freq: Every day | SUBCUTANEOUS | Status: DC
Start: 2015-11-24 — End: 2015-11-26
  Administered 2015-11-24 – 2015-11-25 (×2): 5 [IU] via SUBCUTANEOUS
  Filled 2015-11-24 (×3): qty 0.05

## 2015-11-24 MED ORDER — POTASSIUM CHLORIDE CRYS ER 20 MEQ PO TBCR
40.0000 meq | EXTENDED_RELEASE_TABLET | Freq: Four times a day (QID) | ORAL | Status: AC
  Administered 2015-11-24: 40 meq via ORAL
  Filled 2015-11-24 (×3): qty 2

## 2015-11-24 MED ORDER — MAGNESIUM SULFATE 2 GM/50ML IV SOLN
2.0000 g | Freq: Once | INTRAVENOUS | Status: AC
  Administered 2015-11-24: 2 g via INTRAVENOUS
  Filled 2015-11-24: qty 50

## 2015-11-24 NOTE — NC FL2 (Signed)
[place] MEDICAID FL2 LEVEL OF CARE SCREENING TOOL     IDENTIFICATION  Patient Name: Benjamin Hodge Birthdate: [birth year] Sex: male Admission Date (Current Location): 11/22/2015  [facility] and [place] Number:  Producer, television/film/video and Address:  [facility], [address] [place], [place] [address]      Provider Number: [id number]  Attending Physician Name and Address:  Clydia Llano, MD  Relative Name and Phone Number:       Current Level of Care: Hospital Recommended Level of Care: Skilled Nursing Facility Prior Approval Number:    Date Approved/Denied:   PASRR Number:  [id number])  Discharge Plan: SNF    Current Diagnoses: Patient Active Problem List   Diagnosis Date Noted  . Acute respiratory failure with hypoxia (HCC) 11/22/2015  . Acute kidney injury superimposed on chronic kidney disease (HCC) 11/22/2015  . Sepsis (HCC) 11/22/2015  . HCAP (healthcare-associated pneumonia) 11/22/2015  . DNR (do not resuscitate) 11/22/2015  . Palliative care encounter   . Sick 11/18/2015  . Depression 11/07/2015  . Hypotension 10/29/2015  . Slurred speech   . Hyperglycemia 10/18/2015  . Hyperglycemic crisis in diabetes mellitus (HCC) 10/18/2015  . Aspiration pneumonia (HCC) 10/18/2015  . Acute encephalopathy   . Acute hyperglycemia   . Diarrhea 10/16/2015  . Renal mass, right: Incentendal finding MRI 10/02/2015 10/03/2015  . Hypogonadism in male 10/03/2015  . Dehydration   . Hyperglycemia due to type 1 diabetes mellitus (HCC) 10/02/2015  . Encephalopathy, metabolic   . Heme positive stool 10/01/2015  . Elevated CEA 09/30/2015  . Elevated CA 19-9 level 09/30/2015  . CKD (chronic kidney disease), stage II 09/30/2015  . COPD with exacerbation (HCC) 09/27/2015  . Narcotic dependency, continuous (HCC) 09/27/2015  . AKI (acute kidney injury) (HCC) 09/27/2015  . DKA (diabetic ketoacidosis) (HCC) 09/14/2015  . Narcotic abuse   . Hyperglycemia  without ketosis 09/13/2015  . DKA (diabetic ketoacidoses) (HCC) 09/06/2015  . Partial symptomatic epilepsy with complex partial seizures, not intractable, without status epilepticus (HCC) 07/16/2015  . Dementia without behavioral disturbance 07/16/2015  . Hypersomnia with long sleep time, idiopathic 07/16/2015  . Pruritus of scalp 05/04/2015  . Loose stools 05/04/2015  . Absence attack (HCC) 05/04/2015  . B12 deficiency 05/04/2015  . Seizures (HCC) 04/10/2015  . Cerebrovascular disease 04/10/2015  . PVD (peripheral vascular disease) (HCC) 02/18/2015  . Testosterone deficiency 01/02/2015  . 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 Right-sided chest wall pain 11/27/2014  . Ecchymosis 08/29/2014  . Anemia- chronic 04/03/2014  . CAD- moderate3V disease at cath 7/15- medical Rx 03/28/2014  . Dyslipidemia 03/13/2014  . Non-STEMI (non-ST elevated myocardial infarction) (HCC) 03/10/2014  . 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 Low back pain potentially associated with spinal stenosis 02/26/2014  . ADHD, adult residual type 05/31/2013  . Elevated AST (SGOT) 01/23/2013  . SHOULDER PAIN, LEFT 03/04/2009  . ANXIETY 07/18/2008  . IRRITABLE BOWEL SYNDROME 04/27/2008  . NOCTURIA 03/13/2008  . COPD (chronic obstructive pulmonary disease) (HCC) 02/27/2008  . Mild nonproliferative diabetic retinopathy (HCC) 11/18/2007  . LEG PAIN, RIGHT 09/29/2007  . POLYNEUROPATHY IN DIABETES 02/09/2007  . HYPOTENSION, ORTHOSTATIC 02/09/2007  . Hypothyroidism 09/27/2006  . Type 1 diabetes mellitus with neurological manifestations, uncontrolled (HCC) 09/27/2006  . ERECTILE DYSFUNCTION 09/27/2006  . Essential hypertension 09/27/2006  . SPINAL STENOSIS, CERVICAL 09/27/2006  . Hypersomnia 09/27/2006    Orientation RESPIRATION BLADDER Height & Weight  Self  O2 (2L O2 PRN) Incontinent Weight: 127 lb 10.3 oz (57.9 kg) Height:  5'  8" (172.7 cm)  BEHAVIORAL SYMPTOMS/MOOD NEUROLOGICAL BOWEL NUTRITION STATUS      Continent Diet (DYS 3 carb modified)  AMBULATORY STATUS COMMUNICATION OF NEEDS Skin   Extensive Assist Verbally Normal                       Personal Care Assistance Level of Assistance  Feeding, Bathing, Dressing Bathing Assistance: Maximum assistance Feeding assistance: Maximum assistance Dressing Assistance: Maximum assistance     Functional Limitations Info    Sight Info: Adequate Hearing Info: Adequate Speech Info: Adequate    SPECIAL CARE FACTORS FREQUENCY       Contractures Contractures Info: Not present    Additional Factors Info  Code Status, Insulin Sliding Scale (DNR) Code Status Info:  (DNR) Allergies Info:  (DNR)   Insulin Sliding Scale Info:  (3x daily with meals)       Current Medications (11/24/2015):  This is the current hospital active medication list Current Facility-Administered Medications  Medication Dose Route Frequency Provider Last Rate Last Dose  . acetaminophen (TYLENOL) tablet 650 mg  650 mg Oral Q6H PRN Gwenyth Bender, NP       Or  . acetaminophen (TYLENOL) suppository 650 mg  650 mg Rectal Q6H PRN Gwenyth Bender, NP      . bisacodyl (DULCOLAX) suppository 10 mg  10 mg Rectal Daily PRN Gwenyth Bender, NP      . ceFEPIme (MAXIPIME) 2 g in dextrose 5 % 50 mL IVPB  2 g Intravenous Q24H Stevphen Rochester, RPH   2 g at 11/24/15 0708  . guaiFENesin (MUCINEX) 12 hr tablet 1,200 mg  1,200 mg Oral BID Clydia Llano, MD   1,200 mg at 11/23/15 1248  . guaiFENesin-dextromethorphan (ROBITUSSIN DM) 100-10 MG/5ML syrup 5 mL  5 mL Oral Q4H PRN Clydia Llano, MD      . heparin injection 5,000 Units  5,000 Units Subcutaneous 3 times per day Gwenyth Bender, NP   5,000 Units at 11/23/15 2334  . insulin aspart (novoLOG) injection 0-9 Units  0-9 Units Subcutaneous TID WC Gwenyth Bender, NP   3 Units at 11/24/15 1201  . insulin glargine (LANTUS) injection 5 Units  5 Units Subcutaneous  Daily Clydia Llano, MD   5 Units at 11/24/15 1000  . ipratropium-albuterol (DUONEB) 0.5-2.5 (3) MG/3ML nebulizer solution 3 mL  3 mL Nebulization Q4H PRN Joseph Art, DO      . ipratropium-albuterol (DUONEB) 0.5-2.5 (3) MG/3ML nebulizer solution 3 mL  3 mL Nebulization TID Joseph Art, DO   3 mL at 11/24/15 1036  . levETIRAcetam (KEPPRA) 1,000 mg in sodium chloride 0.9 % 100 mL IVPB  1,000 mg Intravenous Q12H Gwenyth Bender, NP 440 mL/hr at 11/24/15 1102 1,000 mg at 11/24/15 1102  . levothyroxine (SYNTHROID, LEVOTHROID) injection 37.5 mcg  37.5 mcg Intravenous Daily Lesle Chris Black, NP   37.5 mcg at 11/24/15 0859  . magnesium sulfate IVPB 2 g 50 mL  2 g Intravenous Once Clydia Llano, MD   2 g at 11/24/15 1136  . morphine 2 MG/ML injection 1 mg  1 mg Intravenous Q2H PRN Gwenyth Bender, NP      . morphine CONCENTRATE 10 MG/0.5ML oral solution 5 mg  5 mg Oral Q4H PRN Irean Hong, NP      . ondansetron [facility]) tablet 4 mg  4 mg Oral Q6H PRN Gwenyth Bender, NP       Or  . ondansetron [facility]) injection 4 mg  4 mg Intravenous Q6H PRN Lesle Chris Black, NP      . potassium chloride SA (K-DUR,KLOR-CON) CR tablet 40 mEq  40 mEq Oral Q6H Clydia Llano, MD   40 mEq at 11/24/15 0859  . vancomycin (VANCOCIN) IVPB 750 mg/150 ml premix  750 mg Intravenous Q24H Stevphen Rochester, RPH   750 mg at 11/24/15 1173     Discharge Medications: Please see discharge summary for a list of discharge medications.  Relevant Imaging Results:  Relevant Lab Results:   Additional Information  Pt is returning to facility for comfort care with hospice following.  Edwen Mclester M, LCSW

## 2015-11-24 NOTE — Progress Notes (Signed)
Hypoglycemic Event  CBG: 27  Treatment: D50 IV 50 mL  Symptoms: Nervous/irritable  Follow-up CBG: Time:0615 CBG Result:174  Possible Reasons for Event: Unknown  Comments/MD notified:alerted by lab to a low serum glucose level.  CBG confirmed at bedside.  PT alert and oriented by slightly agitated.    Barrington Ellison

## 2015-11-24 NOTE — Discharge Summary (Deleted)
[facility] PROGRESS NOTE   Benjamin Hodge QZE:[id number] DOB: [birth year] DOA: 11/22/2015 PCP: Hennie Duos, MD  HPI/Subjective: Has had hypoglycemic episode of 27 this morning, reported that he is not feeling good. Palliative care met with family yesterday, treat for now, but he'll go to the nursing home with hospice services.  Assessment/Plan: Principal Problem:   Acute respiratory failure with hypoxia (HCC) Active Problems:   Hypothyroidism   Type 1 diabetes mellitus with neurological manifestations, uncontrolled (HCC)   Essential hypertension   COPD (chronic obstructive pulmonary disease) (HCC)   CAD- moderate3V disease at cath 7/15- medical Rx   Anemia- chronic   Seizures (HCC)   B12 deficiency   Dementia without behavioral disturbance   Acute encephalopathy   Acute kidney injury superimposed on chronic kidney disease ([place])   Sepsis ([place])   HCAP (healthcare-associated pneumonia)   DNR (do not resuscitate)   Acute respiratory failure with hypoxia Secondary to healthcare associated pneumonia in the setting of COPD.  Oxygen saturation level 50% on room air respiratory rate 31 on admission. ABG reveals a pH 7.27, PCO2 44.5 pO2 61. Placed on BiPAP, currently on 2 L of oxygen. Wean oxygen as needed.  Hypoglycemia Blood sugar dropped all the way down to 27, treated with IV dextrose. I will decrease Lantus insulin down to 5 units. Continue to check CBGs before meals and at bedtime.  Sepsis secondary to healthcare associated pneumonia See #1. Patient discharged 17 days ago after a hospitalization for pneumonia. pH is 7.27 on admission. Started on broad-spectrum antibiotics with vancomycin and cefepime. Aggressive hydration with IV fluids.  Healthcare associated pneumonia Chest x-ray is consistent with pneumonia, patient has hypoxic respiratory failure with O2 sats 50%. Started on vancomycin and cefepime. Continue bronchodilators, mucolytics, antitussives  and oxygen as needed.  Hypoglycemia Facility reports CBG 30 at initial evaluation. 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 Home regimen includes 3 units NovoLog 3 times a day with meals, sliding scale and 24 units Toujeo.  Of note last hospitalization patient treated for DKA Restart home regimen, follow closely.  COPD Not on home oxygen. Home meds to include nebulizers. Nebulizers every 4 hours as noted above  Acute encephalopathy in the setting of dementia.  History of same. Likely related to acute illness. Moving all extremities spontaneously. If no improvement as above results consider CT  Acute on chronic kidney disease stage III Creatinine 2.03 on admission. It appears his baseline 1.4. Likely related to above. This is back to baseline of 1.34.   Anemia Likely related to chronic disease. Hemoglobin on admission 9.4. Around baseline. Hemoglobin dropped to 7.8 with aggressive IV fluids hydration, likely secondary to hemodilution. If hemoglobin drops further we will transfuse blood.  Hypertension Patient hypotensive on admission. See above. Home medications include Lopressor  Peripheral vascular disease. Stable -Hold Plavix for now  Hypothyroidism. Recent TSH 1.7. -Continue Synthroid IV  Chronic diarrhea. Chart review indicates patient uses Imodium when necessary greater than 15 years. Stable at baseline  Renal mass right. Review indicates interval increase in size. -Follow-up with urology  History of seizures. No documentation of recent seizure activity. -Continue Keppra  Hypokalemia Repleted with oral supplements.  Code Status: DNR Family Communication: Plan discussed with the patient. Disposition Plan: Remains inpatient, transfer to telemetry. Diet: DIET DYS 3 Room service appropriate?: Yes; Fluid consistency:: Thin  Consultants:  Palliative medicine team  Procedures:  None  Antibiotics:  Vancomycin and Zosyn (indicate start date, and stop date if  known)  Objective: Filed Vitals:   11/24/15 0100 11/24/15 0433  BP: 166/68 165/68  Pulse: 77 73  Temp: 98.9 F (37.2 C) 98.4 F (36.9 C)  Resp: 20 20    Intake/Output Summary (Last 24 hours) at 11/24/15 1018 Last data filed at 11/24/15 0434  Gross per 24 hour  Intake      0 ml  Output   5150 ml  Net  -5150 ml   Filed Weights   11/22/15 1700 11/23/15 0500 11/24/15 0500  Weight: 58.1 kg (128 lb 1.4 oz) 58.1 kg (128 lb 1.4 oz) 57.9 kg (127 lb 10.3 oz)    Exam: General: Alert and awake, oriented x3, not in any acute distress. HEENT: anicteric sclera, pupils reactive to light and accommodation, EOMI CVS: S1-S2 clear, no murmur rubs or gallops Chest: clear to auscultation bilaterally, no wheezing, rales or rhonchi Abdomen: soft nontender, nondistended, normal bowel sounds, no organomegaly Extremities: no cyanosis, clubbing or edema noted bilaterally Neuro: Cranial nerves II-XII intact, no focal neurological deficits  Data Reviewed: Basic Metabolic Panel:  Recent Labs Lab 11/22/15 0441 11/22/15 0738 11/23/15 0309 11/24/15 0442  NA 143  --  142 145  K 4.3  --  4.2 3.2*  CL 108  --  106 104  CO2 21*  --  23 28  GLUCOSE 90  --  266* 35*  BUN 50*  --  32* 19  CREATININE 2.03* 2.02* 1.34* 0.94  CALCIUM 8.8*  --  8.7* 9.3   Liver Function Tests:  Recent Labs Lab 11/22/15 0441 11/23/15 0309  AST 61* 39  ALT 39 31  ALKPHOS 81 78  BILITOT 0.2* 0.4  PROT 6.3* 6.0*  ALBUMIN 2.6* 2.2*   No results for input(s): LIPASE, AMYLASE in the last 168 hours. No results for input(s): AMMONIA in the last 168 hours. CBC:  Recent Labs Lab 11/22/15 0441 11/22/15 0738 11/23/15 0309 11/24/15 0442  WBC 3.9* 3.8* 17.4* 17.1*  NEUTROABS 3.2  --   --   --   HGB 9.4* 8.1* 7.8* 8.6*  HCT 28.7* 25.5* 23.7* 26.5*  MCV 92.3 90.7 88.8 88.9  PLT 273 226 251 323   Cardiac Enzymes:  Recent Labs Lab 11/22/15 0738 11/22/15 1706 11/22/15 2230  TROPONINI <0.03 0.05* <0.03    BNP (last 3 results)  Recent Labs  10/20/15 1043 11/22/15 0443  BNP 280.3* 262.2*    ProBNP (last 3 results) No results for input(s): PROBNP in the last 8760 hours.  CBG:  Recent Labs Lab 11/23/15 1129 11/23/15 1636 11/23/15 2152 11/24/15 0555 11/24/15 0621  GLUCAP 98 169* 82 27* 174*    Micro Recent Results (from the past 240 hour(s))  Blood Culture (routine x 2)     Status: None (Preliminary result)   Collection Time: 11/22/15  4:19 AM  Result Value Ref Range Status   Specimen Description BLOOD RIGHT ARM  Final   Special Requests BOTTLES DRAWN AEROBIC AND ANAEROBIC 5ML  Final   Culture NO GROWTH 1 DAY  Final   Report Status PENDING  Incomplete  Blood Culture (routine x 2)     Status: None (Preliminary result)   Collection Time: 11/22/15  4:26 AM  Result Value Ref Range Status   Specimen Description BLOOD LEFT FOREARM  Final   Special Requests BOTTLES DRAWN AEROBIC ONLY 5ML  Final   Culture NO GROWTH 1 DAY  Final   Report Status PENDING  Incomplete  Urine culture     Status: None   Collection Time:  11/22/15  5:18 AM  Result Value Ref Range Status   Specimen Description URINE, CLEAN CATCH  Final   Special Requests NONE  Final   Culture 1,000 COLONIES/mL INSIGNIFICANT GROWTH  Final   Report Status 11/23/2015 FINAL  Final  MRSA PCR Screening     Status: None   Collection Time: 11/22/15  4:49 PM  Result Value Ref Range Status   MRSA by PCR NEGATIVE NEGATIVE Final    Comment:        The GeneXpert MRSA Assay (FDA approved for NASAL specimens only), is one component of a comprehensive MRSA colonization surveillance program. It is not intended to diagnose MRSA infection nor to guide or monitor treatment for MRSA infections.      Studies: No results found.  Scheduled Meds: . ceFEPime (MAXIPIME) IV  2 g Intravenous Q24H  . guaiFENesin  1,200 mg Oral BID  . heparin  5,000 Units Subcutaneous 3 times per day  . insulin aspart  0-9 Units Subcutaneous TID  WC  . insulin glargine  5 Units Subcutaneous Daily  . ipratropium-albuterol  3 mL Nebulization TID  . levETIRAcetam  1,000 mg Intravenous Q12H  . levothyroxine  37.5 mcg Intravenous Daily  . potassium chloride  40 mEq Oral Q6H  . vancomycin  750 mg Intravenous Q24H   Continuous Infusions:      Time spent: 35 minutes    [facility] A  [facility] Pager [phone] If 7PM-7AM, please contact night-coverage at [email or web address], password [facility] 11/24/2015, 10:18 AM  LOS: 2 days

## 2015-11-24 NOTE — Evaluation (Signed)
Physical Therapy Evaluation Patient Details Name: Benjamin Hodge MRN: [id number] DOB: 11/18/1940 Today's Date: 11/24/2015   History of Present Illness  75 y.o. male admitted on 10/28/2015 with pneumonia.  Pt with significant PMH-see MD for extensive list. Of note pt does have dementia and has had multiple hospital admission over the last 6 months.   Clinical Impression  Pt admitted with above diagnosis. Pt currently with functional limitations due to the deficits listed below (see PT Problem List).  Pt will benefit from skilled PT to maximize functional ability to increase quality of life. No family present and from medical records appears pt was ambulatory recently, but unclear what he was doing most recently.  Noted goal is comfort care at SNF.      Follow Up Recommendations SNF;Supervision/Assistance - 24 hour    Equipment Recommendations       Recommendations for Other Services       Precautions / Restrictions Precautions Precautions: Fall Restrictions Weight Bearing Restrictions: No      Mobility  Bed Mobility Overal bed mobility: Needs Assistance Bed Mobility: Supine to Sit     Supine to sit: Mod assist     General bed mobility comments: assist to come to long sitting. Pt did not want to dangle legs over EOB  Transfers Overall transfer level:  (Pt refusing transfers or gait)                  Ambulation/Gait Ambulation/Gait assistance:  (Pt refused)              Stairs            Wheelchair Mobility    Modified Rankin (Stroke Patients Only)       Balance Overall balance assessment: Needs assistance Sitting-balance support: No upper extremity supported Sitting balance-Leahy Scale: Fair Sitting balance - Comments: NT greater than fair and was done in long sitting                                     Pertinent Vitals/Pain Pain Assessment: 0-10 Pain Score:  (Pt unable to rate. C/o headache when sitting up) Pain  Location: headache Pain Intervention(s): Limited activity within patient's tolerance;Monitored during session    Home Living Family/patient expects to be discharged to:: Skilled nursing facility               Home Equipment: Gilmer Mor - single point Additional Comments: No family present to discuss. Per palliative care note plan is to go to SNF for comfort care.     Prior Function           Comments: No family present. Pt unable to give history.      Hand Dominance   Dominant Hand: Right    Extremity/Trunk Assessment   Upper Extremity Assessment: Generalized weakness           Lower Extremity Assessment: Generalized weakness (Did not assess in standing as pt refused.)         Communication   Communication: No difficulties  Cognition Arousal/Alertness: Awake/alert Behavior During Therapy: Restless Overall Cognitive Status: No family/caregiver present to determine baseline cognitive functioning Area of Impairment: Orientation;Attention;Awareness Orientation Level: Place;Time;Situation     Following Commands: Follows one step commands consistently       General Comments: No family present    General Comments General comments (skin integrity, edema, etc.): Pt refused OOB and as I tried to convince  him to sit EOB and walk or get to chair he became mildly agitated and stronly refused attempts to get OOB     Exercises        Assessment/Plan    PT Assessment Patient needs continued PT services  PT Diagnosis Generalized weakness   PT Problem List Decreased strength;Decreased activity tolerance;Decreased balance;Decreased mobility;Decreased cognition;Decreased knowledge of use of DME;Decreased safety awareness  PT Treatment Interventions DME instruction;Gait training;Therapeutic exercise;Therapeutic activities;Balance training;Patient/family education   PT Goals (Current goals can be found in the Care Plan section) Acute Rehab PT Goals Patient Stated Goal:  none stated PT Goal Formulation: Patient unable to participate in goal setting Time For Goal Achievement: 12/01/15 Potential to Achieve Goals: Fair    Frequency Min 2X/week   Barriers to discharge        Co-evaluation               End of Session   Activity Tolerance: Patient limited by pain;Treatment limited secondary to agitation Patient left: in bed;with call bell/phone within reach;with bed alarm set Nurse Communication:  (Pt refused OOB and reports a headache)         Time: 1914-7829 PT Time Calculation (min) (ACUTE ONLY): 11 min   Charges:   PT Evaluation $PT Eval Moderate Complexity: 1 Procedure     PT G CodesDonnella Sham 11/24/2015, 11:10 AM  Lavona Mound, PT  [phone] 11/24/2015

## 2015-11-24 NOTE — Progress Notes (Signed)
OT Cancellation Note  Patient Details Name: Benjamin Hodge MRN: [id number] DOB: 02/12/1941   Cancelled Treatment:    Reason Eval/Treat Not Completed:  (OT screened) Per note, pt planning to be comfort care once he gets back to facility. If OT eval is needed please call Acute Rehab Dept. at [phone] or text page OT at [phone].    Earlie Raveling OTR/L [phone] 11/24/2015, 12:10 PM

## 2015-11-25 DIAGNOSIS — I2510 Atherosclerotic heart disease of native coronary artery without angina pectoris: Secondary | ICD-10-CM

## 2015-11-25 DIAGNOSIS — E1065 Type 1 diabetes mellitus with hyperglycemia: Secondary | ICD-10-CM

## 2015-11-25 DIAGNOSIS — E039 Hypothyroidism, unspecified: Secondary | ICD-10-CM

## 2015-11-25 DIAGNOSIS — E1049 Type 1 diabetes mellitus with other diabetic neurological complication: Secondary | ICD-10-CM

## 2015-11-25 LAB — BASIC METABOLIC PANEL
ANION GAP: 13 (ref 5–15)
BUN: 19 mg/dL (ref 6–20)
CHLORIDE: 103 mmol/L (ref 101–111)
CO2: 29 mmol/L (ref 22–32)
CREATININE: 1.03 mg/dL (ref 0.61–1.24)
Calcium: 8.9 mg/dL (ref 8.9–10.3)
GFR calc non Af Amer: >60 mL/min (ref >60)
GLUCOSE: 104 mg/dL — AB (ref 65–99)
Potassium: 3.8 mmol/L (ref 3.5–5.1)
Sodium: 145 mmol/L (ref 135–145)

## 2015-11-25 LAB — GLUCOSE, CAPILLARY
GLUCOSE-CAPILLARY: 123 mg/dL — AB (ref 65–99)
GLUCOSE-CAPILLARY: 170 mg/dL — AB (ref 65–99)
GLUCOSE-CAPILLARY: 174 mg/dL — AB (ref 65–99)
Glucose-Capillary: 172 mg/dL — ABNORMAL HIGH (ref 65–99)
Glucose-Capillary: 57 mg/dL — ABNORMAL LOW (ref 65–99)

## 2015-11-25 LAB — MAGNESIUM: Magnesium: 2 mg/dL (ref 1.7–2.4)

## 2015-11-25 MED ORDER — LORAZEPAM 2 MG/ML IJ SOLN: 1.0000 mg | INTRAMUSCULAR | Status: DC | PRN

## 2015-11-25 MED ORDER — LORAZEPAM 2 MG/ML IJ SOLN
1.0000 mg | INTRAMUSCULAR | Status: AC
Start: 2015-11-25 — End: 2015-11-25
  Administered 2015-11-25: 1 mg via INTRAVENOUS
  Filled 2015-11-25: qty 1

## 2015-11-25 MED ORDER — MORPHINE SULFATE (PF) 4 MG/ML IV SOLN: 4.0000 mg | INTRAVENOUS | Status: DC | PRN

## 2015-11-25 MED ORDER — CLOBETASOL PROPIONATE 0.05 % EX CREA
TOPICAL_CREAM | Freq: Two times a day (BID) | CUTANEOUS | Status: DC
  Administered 2015-11-25: 14:00:00 via TOPICAL
  Administered 2015-11-26 (×2): 1 via TOPICAL
  Filled 2015-11-25: qty 15

## 2015-11-25 MED ORDER — DIPHENHYDRAMINE HCL 50 MG/ML IJ SOLN
25.0000 mg | Freq: Once | INTRAMUSCULAR | Status: AC
  Administered 2015-11-25: 25 mg via INTRAVENOUS
  Filled 2015-11-25 (×2): qty 0.5

## 2015-11-25 MED ORDER — MORPHINE SULFATE (CONCENTRATE) 10 MG/0.5ML PO SOLN
10.0000 mg | Freq: Four times a day (QID) | ORAL | Status: DC
  Administered 2015-11-25 – 2015-11-26 (×2): 10 mg via ORAL
  Filled 2015-11-25 (×2): qty 0.5

## 2015-11-25 MED ORDER — HALOPERIDOL LACTATE 5 MG/ML IJ SOLN
1.0000 mg | INTRAMUSCULAR | Status: DC | PRN
Start: 2015-11-25 — End: 2015-11-26
  Administered 2015-11-25: 1 mg via INTRAVENOUS
  Filled 2015-11-25 (×2): qty 1

## 2015-11-25 MED ORDER — DEXTROSE 50 % IV SOLN
INTRAVENOUS | Status: AC
  Administered 2015-11-25: 50 mL
  Filled 2015-11-25: qty 50

## 2015-11-25 NOTE — Discharge Summary (Deleted)
[facility] PROGRESS NOTE   Benjamin Hodge ZOX:[id number] DOB: 27-Nov-1940 DOA: 11/22/2015 PCP: Margit Hanks, MD  HPI/Subjective: Patient was not feeling very well this morning, reporting feeling rough and holding his head. Control pain, likely can be discharged in a.m.  Assessment/Plan: Principal Problem:   Acute respiratory failure with hypoxia (HCC) Active Problems:   Hypothyroidism   Type 1 diabetes mellitus with neurological manifestations, uncontrolled (HCC)   Essential hypertension   COPD (chronic obstructive pulmonary disease) (HCC)   CAD- moderate3V disease at cath 7/15- medical Rx   Anemia- chronic   Seizures (HCC)   B12 deficiency   Dementia without behavioral disturbance   Acute encephalopathy   Acute kidney injury superimposed on chronic kidney disease (HCC)   Sepsis (HCC)   HCAP (healthcare-associated pneumonia)   DNR (do not resuscitate)   Acute respiratory failure with hypoxia Secondary to healthcare associated pneumonia in the setting of COPD.  Oxygen saturation level 50% on room air respiratory rate 31 on admission. ABG reveals a pH 7.27, PCO2 44.5 pO2 61. Placed on BiPAP, currently on 2 L of oxygen. Currently not on oxygen.  Hypoglycemia Blood sugar dropped all the way down to 27, treated with IV dextrose. Lantus insulin decreased to 5 units. Continue to check CBGs before meals and at bedtime.  Sepsis secondary to healthcare associated pneumonia See #1. Patient discharged 17 days ago after a hospitalization for pneumonia. pH is 7.27 on admission. Started on broad-spectrum antibiotics with vancomycin and cefepime. Aggressive hydration with IV fluids.  Healthcare associated pneumonia Chest x-ray is consistent with pneumonia, patient has hypoxic respiratory failure with O2 sats 50%. Started on vancomycin and cefepime. Continue bronchodilators, mucolytics, antitussives and oxygen as needed.  Hypoglycemia Facility reports CBG 30 at  initial evaluation. 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 Home regimen includes 3 units NovoLog 3 times a day with meals, sliding scale and 24 units Toujeo.  Of note last hospitalization patient treated for DKA Restart home regimen, follow closely.  COPD Not on home oxygen. Home meds to include nebulizers. Nebulizers every 4 hours as noted above  Acute encephalopathy in the setting of dementia.  History of same. Likely related to acute illness. Moving all extremities spontaneously. If no improvement as above results consider CT  Acute on chronic kidney disease stage III Creatinine 2.03 on admission. It appears his baseline 1.4. Likely related to above. This is back to baseline of 1.34.   Anemia Likely related to chronic disease. Hemoglobin on admission 9.4. Around baseline. Hemoglobin dropped to 7.8 with aggressive IV fluids hydration, likely secondary to hemodilution. If hemoglobin drops further we will transfuse blood.  Hypertension Patient hypotensive on admission. See above. Home medications include Lopressor  Peripheral vascular disease. Stable -Hold Plavix for now  Hypothyroidism. Recent TSH 1.7. -Continue Synthroid IV  Chronic diarrhea. Chart review indicates patient uses Imodium when necessary greater than 15 years. Stable at baseline  Renal mass right. Review indicates interval increase in size. -Follow-up with urology  History of seizures. No documentation of recent seizure activity. -Continue Keppra  Hypokalemia Repleted with oral supplements.   Code Status: DNR Family Communication: Plan discussed with the patient. Disposition Plan: Remains inpatient, transfer to telemetry. Diet: DIET DYS 3 Room service appropriate?: Yes; Fluid consistency:: Thin  Consultants:  Palliative medicine team  Procedures:  None  Antibiotics:  Vancomycin and Zosyn (indicate start date, and stop date if known)   Objective: Filed Vitals:   11/24/15 2021 11/25/15  0643  BP: 140/64  115/60  Pulse: 73 70  Temp: 97.7 F (36.5 C) 97.3 F (36.3 C)  Resp: 16 18    Intake/Output Summary (Last 24 hours) at 11/25/15 1320 Last data filed at 11/25/15 0847  Gross per 24 hour  Intake    770 ml  Output   1300 ml  Net   -530 ml   Filed Weights   11/23/15 0500 11/24/15 0500 11/25/15 0500  Weight: 58.1 kg (128 lb 1.4 oz) 57.9 kg (127 lb 10.3 oz) 57.652 kg (127 lb 1.6 oz)    Exam: General: Alert and awake, oriented x3, not in any acute distress. HEENT: anicteric sclera, pupils reactive to light and accommodation, EOMI CVS: S1-S2 clear, no murmur rubs or gallops Chest: clear to auscultation bilaterally, no wheezing, rales or rhonchi Abdomen: soft nontender, nondistended, normal bowel sounds, no organomegaly Extremities: no cyanosis, clubbing or edema noted bilaterally Neuro: Cranial nerves II-XII intact, no focal neurological deficits  Data Reviewed: Basic Metabolic Panel:  Recent Labs Lab 11/22/15 0441 11/22/15 0738 11/23/15 0309 11/24/15 0442 11/25/15 0250  NA 143  --  142 145 145  K 4.3  --  4.2 3.2* 3.8  CL 108  --  106 104 103  CO2 21*  --  23 28 29   GLUCOSE 90  --  266* 35* 104*  BUN 50*  --  32* 19 19  CREATININE 2.03* 2.02* 1.34* 0.94 1.03  CALCIUM 8.8*  --  8.7* 9.3 8.9  MG  --   --   --   --  2.0   Liver Function Tests:  Recent Labs Lab 11/22/15 0441 11/23/15 0309  AST 61* 39  ALT 39 31  ALKPHOS 81 78  BILITOT 0.2* 0.4  PROT 6.3* 6.0*  ALBUMIN 2.6* 2.2*   No results for input(s): LIPASE, AMYLASE in the last 168 hours. No results for input(s): AMMONIA in the last 168 hours. CBC:  Recent Labs Lab 11/22/15 0441 11/22/15 0738 11/23/15 0309 11/24/15 0442  WBC 3.9* 3.8* 17.4* 17.1*  NEUTROABS 3.2  --   --   --   HGB 9.4* 8.1* 7.8* 8.6*  HCT 28.7* 25.5* 23.7* 26.5*  MCV 92.3 90.7 88.8 88.9  PLT 273 226 251 323   Cardiac Enzymes:  Recent Labs Lab 11/22/15 0738 11/22/15 1706 11/22/15 2230  TROPONINI <0.03  0.05* <0.03   BNP (last 3 results)  Recent Labs  10/20/15 1043 11/22/15 0443  BNP 280.3* 262.2*    ProBNP (last 3 results) No results for input(s): PROBNP in the last 8760 hours.  CBG:  Recent Labs Lab 11/24/15 1138 11/24/15 1634 11/24/15 2349 11/25/15 0647 11/25/15 1153  GLUCAP 246* 110* 91 123* 172*    Micro Recent Results (from the past 240 hour(s))  Blood Culture (routine x 2)     Status: None (Preliminary result)   Collection Time: 11/22/15  4:19 AM  Result Value Ref Range Status   Specimen Description BLOOD RIGHT ARM  Final   Special Requests BOTTLES DRAWN AEROBIC AND ANAEROBIC  Final   Culture NO GROWTH 2 DAYS  Final   Report Status PENDING  Incomplete  Blood Culture (routine x 2)     Status: None (Preliminary result)   Collection Time: 11/22/15  4:26 AM  Result Value Ref Range Status   Specimen Description BLOOD LEFT FOREARM  Final   Special Requests BOTTLES DRAWN AEROBIC ONLY  Final   Culture NO GROWTH 2 DAYS  Final   Report Status PENDING  Incomplete  Urine culture     Status: None   Collection Time: 11/22/15  5:18 AM  Result Value Ref Range Status   Specimen Description URINE, CLEAN CATCH  Final   Special Requests NONE  Final   Culture 1,000 COLONIES/mL INSIGNIFICANT GROWTH  Final   Report Status 11/23/2015 FINAL  Final  MRSA PCR Screening     Status: None   Collection Time: 11/22/15  4:49 PM  Result Value Ref Range Status   MRSA by PCR NEGATIVE NEGATIVE Final    Comment:        The GeneXpert MRSA Assay (FDA approved for NASAL specimens only), is one component of a comprehensive MRSA colonization surveillance program. It is not intended to diagnose MRSA infection nor to guide or monitor treatment for MRSA infections.      Studies: No results found.  Scheduled Meds: . ceFEPime (MAXIPIME) IV  2 g Intravenous Q24H  . clobetasol cream   Topical BID  . diphenhydrAMINE (BENADRYL) IVPB(SICKLE CELL ONLY)  25 mg Intravenous Once  .  guaiFENesin  1,200 mg Oral BID  . heparin  5,000 Units Subcutaneous 3 times per day  . insulin aspart  0-9 Units Subcutaneous TID WC  . insulin glargine  5 Units Subcutaneous Daily  . levETIRAcetam  1,000 mg Intravenous Q12H  . levothyroxine  37.5 mcg Intravenous Daily  . vancomycin  750 mg Intravenous Q24H   Continuous Infusions:      Time spent: 35 minutes    [facility] A  [facility] Pager [phone] If 7PM-7AM, please contact night-coverage at [email or web address], password [facility] 11/25/2015, 1:20 PM  LOS: 3 days

## 2015-11-25 NOTE — Consult Note (Signed)
   [facility] Inpatient Consult   11/25/2015  Benjamin Hodge 04-Feb-1941 [id number] Patient had been active with [facility] Care Management services prior to admission from a skilled facility with [facility] Social Worker and [facility].  Chart reviewed in EPIC and finds that the patient's disposition is likely a skilled facility with Hospice Care per Palliative Care and inpatient social worker's note.  Last[facility] Care Management will not be active at discharge as the patient will have full care management services covered under Hospice.  For questions, please contact: Charlesetta Shanks, RN BSN CCM [facility]  [phone] business mobile phone Toll free office [phone]

## 2015-11-25 NOTE — Progress Notes (Signed)
Daily Progress Note   Patient Name: Benjamin Hodge       Date: 11/25/2015 DOB: [birth year]  Age: 75 y.o. MRN#: [id number] Attending Physician: Clydia Llano, MD Primary Care Physician: Margit Hanks, MD Admit Date: 11/22/2015  Reason for Consultation/Follow-up: Establishing goals of care, Hospice Evaluation and Non pain symptom management  Subjective: Confused, combative at times, uncooperative. Not taking PO or medications.  Interval Events: PMT consult 3/17 Length of Stay: 3 days  Current Medications: Scheduled Meds:  . ceFEPime (MAXIPIME) IV  2 g Intravenous Q24H  . clobetasol cream   Topical BID  . guaiFENesin  1,200 mg Oral BID  . insulin glargine  5 Units Subcutaneous Daily  . levETIRAcetam  1,000 mg Intravenous Q12H  . morphine CONCENTRATE  10 mg Oral 4 times per day  . vancomycin  750 mg Intravenous Q24H    Continuous Infusions:    PRN Meds: acetaminophen **OR** acetaminophen, bisacodyl, haloperidol lactate, ipratropium-albuterol, LORazepam, morphine injection, ondansetron **OR** ondansetron (ZOFRAN) IV  Physical Exam: Physical Exam              Vital Signs: BP 123/65 mmHg  Pulse 66  Temp(Src) 97.3 F (36.3 C) (Oral)  Resp 16  Ht  (1.727 m)  Wt 57.652 kg (127 lb 1.6 oz)  BMI 19.33 kg/m2  SpO2 85% SpO2: SpO2: (!) 85 % O2 Device: O2 Device: Not Delivered O2 Flow Rate: O2 Flow Rate (L/min): 3 L/min  Intake/output summary:  Intake/Output Summary (Last 24 hours) at 11/25/15 1853 Last data filed at 11/25/15 1409  Gross per 24 hour  Intake    700 ml  Output    650 ml  Net     50 ml   LBM: Last BM Date: 11/21/15 Baseline Weight: Weight: 58.06 kg (128 lb) Most recent weight: Weight: 57.652 kg (127 lb 1.6 oz)       Palliative  Assessment/Data: Flowsheet Rows        Most Recent Value   Intake Tab    Referral Department  Hospitalist   Unit at Time of Referral  ER   Palliative Care Primary Diagnosis  Sepsis/Infectious Disease   Date Notified  11/22/15   Palliative Care Type  New Palliative care   Reason for referral  Clarify Goals of Care, Non-pain Symptom   Date  of Admission  11/22/15   Date first seen by Palliative Care  11/22/15   # of days Palliative referral response time  0 Day(s)   # of days IP prior to Palliative referral  0   Clinical Assessment    Palliative Performance Scale Score  20%   Pain Max last 24 hours  Not able to report   Pain Min Last 24 hours  Not able to report   Dyspnea Max Last 24 Hours  Not able to report   Dyspnea Min Last 24 hours  Not able to report   Nausea Max Last 24 Hours  Not able to report   Nausea Min Last 24 Hours  Not able to report   Anxiety Max Last 24 Hours  Not able to report   Anxiety Min Last 24 Hours  Not able to report   Other Max Last 24 Hours  Not able to report   Psychosocial & Spiritual Assessment    Palliative Care Outcomes    Patient/Family meeting held?  Yes   Who was at the meeting?  son, DIL   Palliative Care Outcomes  Improved non-pain symptom therapy, Provided advance care planning, Clarified goals of care, Provided psychosocial or spiritual support, Counseled regarding hospice, Transitioned to hospice, Completed durable DNR   Palliative Care follow-up planned  Yes, Facility      Additional Data Reviewed: CBC    Component Value Date/Time   WBC 17.1* 11/24/2015 0442   WBC 5.0 06/28/2015 1621   WBC 5.3 11/29/2013 1507   RBC 2.98* 11/24/2015 0442   RBC 3.51* 06/28/2015 1621   RBC 3.37* 11/29/2013 1507   RBC 3.02* 11/26/2008 0727   HGB 8.6* 11/24/2015 0442   HGB 11.2* 11/29/2013 1507   HCT 26.5* 11/24/2015 0442   HCT 33.8* 06/28/2015 1621   HCT 34.4* 11/29/2013 1507   PLT 323 11/24/2015 0442   PLT 231 06/28/2015 1621   PLT 215 11/29/2013  1507   MCV 88.9 11/24/2015 0442   MCV 96 06/28/2015 1621   MCV 102.1* 11/29/2013 1507   MCH 28.9 11/24/2015 0442   MCH 31.3 06/28/2015 1621   MCH 33.2 11/29/2013 1507   MCHC 32.5 11/24/2015 0442   MCHC 32.5 06/28/2015 1621   MCHC 32.5 11/29/2013 1507   RDW 13.2 11/24/2015 0442   RDW 14.6 06/28/2015 1621   RDW 13.4 11/29/2013 1507   LYMPHSABS 0.5* 11/22/2015 0441   LYMPHSABS 1.3 06/28/2015 1621   LYMPHSABS 0.9 11/29/2013 1507   MONOABS 0.2 11/22/2015 0441   MONOABS 0.5 11/29/2013 1507   EOSABS 0.0 11/22/2015 0441   EOSABS 0.2 06/28/2015 1621   EOSABS 0.3 11/29/2013 1507   BASOSABS 0.0 11/22/2015 0441   BASOSABS 0.0 06/28/2015 1621   BASOSABS 0.0 11/29/2013 1507    CMP     Component Value Date/Time   NA 145 11/25/2015 0250   NA 138 06/28/2015 1621   NA 143 11/29/2013 1507   K 3.8 11/25/2015 0250   K 5.0 11/29/2013 1507   CL 103 11/25/2015 0250   CL 108* 11/07/2012 1507   CO2 29 11/25/2015 0250   CO2 26 11/29/2013 1507   GLUCOSE 104* 11/25/2015 0250   GLUCOSE 232* 06/28/2015 1621   GLUCOSE 156* 11/29/2013 1507   GLUCOSE 256* 11/07/2012 1507   BUN 19 11/25/2015 0250   BUN 31* 06/28/2015 1621   BUN 20.9 11/29/2013 1507   CREATININE 1.03 11/25/2015 0250   CREATININE 1.1 11/29/2013 1507   CALCIUM 8.9 11/25/2015 0250  CALCIUM 9.3 11/29/2013 1507   PROT 6.0* 11/23/2015 0309   PROT 6.6 06/28/2015 1621   PROT 6.6 11/29/2013 1507   ALBUMIN 2.2* 11/23/2015 0309   ALBUMIN 4.1 06/28/2015 1621   ALBUMIN 3.9 11/29/2013 1507   AST 39 11/23/2015 0309   AST 22 11/29/2013 1507   ALT 31 11/23/2015 0309   ALT 20 11/29/2013 1507   ALKPHOS 78 11/23/2015 0309   ALKPHOS 65 11/29/2013 1507   BILITOT 0.4 11/23/2015 0309   BILITOT 0.3 06/28/2015 1621   BILITOT 0.46 11/29/2013 1507   GFRNONAA >60 11/25/2015 0250   GFRAA >60 11/25/2015 0250       Problem List:  Patient Active Problem List   Diagnosis Date Noted  . Acute respiratory failure with hypoxia (HCC) 11/22/2015  .  Acute kidney injury superimposed on chronic kidney disease (HCC) 11/22/2015  . Sepsis (HCC) 11/22/2015  . HCAP (healthcare-associated pneumonia) 11/22/2015  . DNR (do not resuscitate) 11/22/2015  . Palliative care encounter   . Sick 11/18/2015  . Depression 11/07/2015  . Hypotension 10/29/2015  . Slurred speech   . Hyperglycemia 10/18/2015  . Hyperglycemic crisis in diabetes mellitus (HCC) 10/18/2015  . Aspiration pneumonia (HCC) 10/18/2015  . Acute encephalopathy   . Acute hyperglycemia   . Diarrhea 10/16/2015  . Renal mass, right: Incentendal finding MRI 10/02/2015 10/03/2015  . Hypogonadism in male 10/03/2015  . Dehydration   . Hyperglycemia due to type 1 diabetes mellitus (HCC) 10/02/2015  . Encephalopathy, metabolic   . Heme positive stool 10/01/2015  . Elevated CEA 09/30/2015  . Elevated CA 19-9 level 09/30/2015  . CKD (chronic kidney disease), stage II 09/30/2015  . COPD with exacerbation (HCC) 09/27/2015  . Narcotic dependency, continuous (HCC) 09/27/2015  . AKI (acute kidney injury) (HCC) 09/27/2015  . DKA (diabetic ketoacidosis) (HCC) 09/14/2015  . Narcotic abuse   . Hyperglycemia without ketosis 09/13/2015  . DKA (diabetic ketoacidoses) (HCC) 09/06/2015  . Partial symptomatic epilepsy with complex partial seizures, not intractable, without status epilepticus (HCC) 07/16/2015  . Dementia without behavioral disturbance 07/16/2015  . Hypersomnia with long sleep time, idiopathic 07/16/2015  . Pruritus of scalp 05/04/2015  . Loose stools 05/04/2015  . Absence attack (HCC) 05/04/2015  . B12 deficiency 05/04/2015  . Seizures (HCC) 04/10/2015  . Cerebrovascular disease 04/10/2015  . PVD (peripheral vascular disease) (HCC) 02/18/2015  . Testosterone deficiency 01/02/2015  . 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 Right-sided chest wall pain 11/27/2014  . Ecchymosis 08/29/2014  . Anemia- chronic 04/03/2014  . CAD- moderate3V disease at  cath 7/15- medical Rx 03/28/2014  . Dyslipidemia 03/13/2014  . Non-STEMI (non-ST elevated myocardial infarction) (HCC) 03/10/2014  . 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 Low back pain potentially associated with spinal stenosis 02/26/2014  . ADHD, adult residual type 05/31/2013  . Elevated AST (SGOT) 01/23/2013  . SHOULDER PAIN, LEFT 03/04/2009  . ANXIETY 07/18/2008  . IRRITABLE BOWEL SYNDROME 04/27/2008  . NOCTURIA 03/13/2008  . COPD (chronic obstructive pulmonary disease) (HCC) 02/27/2008  . Mild nonproliferative diabetic retinopathy (HCC) 11/18/2007  . LEG PAIN, RIGHT 09/29/2007  . POLYNEUROPATHY IN DIABETES 02/09/2007  . HYPOTENSION, ORTHOSTATIC 02/09/2007  . Hypothyroidism 09/27/2006  . Type 1 diabetes mellitus with neurological manifestations, uncontrolled (HCC) 09/27/2006  . ERECTILE DYSFUNCTION 09/27/2006  . Essential hypertension 09/27/2006  . SPINAL STENOSIS, CERVICAL 09/27/2006  . Hypersomnia 09/27/2006     Palliative Care Assessment & Plan    1.Code Status:  DNR  2. Goals of Care/Additional Recommendations:  Plan has been for him to discharge back to skilled facility-with hospice and full comfort care- he is not rehabable at this point and would be unable to participate due to severely altered mental status. He is most certainly not improving on IV antibiotics and not taking PO therefore I recommend these being discontinued. He needs aggressive symptom management for his agitation.   -some of his agitation may be caused from acute urinary retention today-no urine output. Requested after sedation given that a foley catheter be placed- also no BM for several days.   Limitations on Scope of Treatment: Full Comfort Care  Desire for further Chaplaincy support:yes  Psycho-social Needs: Caregiving  Support/Resources and Education on Hospice  3. Symptom Management: 1.Agitation: possible worsened by urinary retention foley  placed and output 350cc retained. Responded well to benzodiazapines and haldol. I am concerned that he may need hospice facility level of care instead of SNF- will discuss with family in AM depending on how he does overnight.  2. Acute Urinary retention- placed foley, leave in for comfort  3. Constipation: Given Dulcolax.  4. Pain/Dyspnea: Given history of opiate abuse he has larger tolerance so I scheduled morphine 10mg  q6 hours Sl for pain.  4. Palliative Prophylaxis:   Aspiration, Delirium Protocol, Oral Care and Turn Reposition  5. Prognosis: < 2 weeks-if he continues without PO intake  6. Discharge Planning:  Hospice facility vs. SNF with Hospice(not appropriate for rehab)   Care plan was discussed with RN, PMT RN.  Thank you for allowing the Palliative Medicine Team to assist in the care of this patient.   Time In: 3:30PM Time Out: 4:05PM Total Time 35 min Prolonged Time Billed no        Edsel Petrin, DO  11/25/2015, 6:53 PM  Please contact Palliative Medicine Team phone at [phone] for questions and concerns.

## 2015-11-25 NOTE — Progress Notes (Signed)
Pharmacy Antibiotic Note  Benjamin Hodge is a 75 y.o. male admitted on 11/22/2015 with pneumonia.  Pharmacy has been consulted for Vancomycin/Cefepime dosing - D#4. Likely PNA on CXR. Afeb, WBC 17.1 stable, PCT 10.81. To dc to SNF w/ hospice.  Plan: - Vancomycin 750 mg IV q24h - Cefepime 2g IV q24h - increase if renal function continues to improve - Monitor C&S, CBC, and clinical progression - Monitor VT prn, likely to dc today  Height: 5\' 8"  (172.7 cm) Weight: 127 lb 1.6 oz (57.652 kg) IBW/kg (Calculated) : 68.4  Temp (24hrs), Avg:97.5 F (36.4 C), Min:97.3 F (36.3 C), Max:97.7 F (36.5 C)   Recent Labs Lab 11/22/15 0437 11/22/15 0441 11/22/15 0738 11/22/15 0749 11/23/15 0309 11/24/15 0442 11/25/15 0250  WBC  --  3.9* 3.8*  --  17.4* 17.1*  --   CREATININE  --  2.03* 2.02*  --  1.34* 0.94 1.03  LATICACIDVEN 1.36  --   --  1.35  --   --   --     Estimated Creatinine Clearance: 52.9 mL/min (by C-G formula based on Cr of 1.03).    Allergies  Allergen Reactions  . Ace Inhibitors Other (See Comments)    dizzy  . Cymbalta [Duloxetine Hcl] Other (See Comments)    dizzy  . Lipitor [Atorvastatin] Other (See Comments)    Caused pain all over body.  . Bee Venom Swelling  . Glucerna [Nutritional Supplements] Diarrhea  . Angiotensin Receptor Blockers Other (See Comments)    unknown    Babs Bertin, PharmD, [facility] Clinical Pharmacist Pager [phone] 11/25/2015 10:52 AM

## 2015-11-25 NOTE — Care Management Important Message (Signed)
Important Message  Patient Details  Name: Benjamin Hodge MRN: [id number] Date of Birth: [birth year]   Medicare Important Message Given:  Yes    Orson Aloe 11/25/2015, 3:57 PM

## 2015-11-26 DIAGNOSIS — D638 Anemia in other chronic diseases classified elsewhere: Secondary | ICD-10-CM | POA: Diagnosis not present

## 2015-11-26 DIAGNOSIS — J441 Chronic obstructive pulmonary disease with (acute) exacerbation: Secondary | ICD-10-CM | POA: Diagnosis not present

## 2015-11-26 DIAGNOSIS — S37099D Other injury of unspecified kidney, subsequent encounter: Secondary | ICD-10-CM | POA: Diagnosis not present

## 2015-11-26 DIAGNOSIS — E1049 Type 1 diabetes mellitus with other diabetic neurological complication: Secondary | ICD-10-CM | POA: Diagnosis not present

## 2015-11-26 DIAGNOSIS — J188 Other pneumonia, unspecified organism: Secondary | ICD-10-CM | POA: Diagnosis not present

## 2015-11-26 DIAGNOSIS — I10 Essential (primary) hypertension: Secondary | ICD-10-CM | POA: Diagnosis not present

## 2015-11-26 DIAGNOSIS — J189 Pneumonia, unspecified organism: Secondary | ICD-10-CM | POA: Diagnosis not present

## 2015-11-26 DIAGNOSIS — G9340 Encephalopathy, unspecified: Secondary | ICD-10-CM | POA: Diagnosis not present

## 2015-11-26 DIAGNOSIS — I2510 Atherosclerotic heart disease of native coronary artery without angina pectoris: Secondary | ICD-10-CM | POA: Diagnosis not present

## 2015-11-26 DIAGNOSIS — E1051 Type 1 diabetes mellitus with diabetic peripheral angiopathy without gangrene: Secondary | ICD-10-CM | POA: Diagnosis not present

## 2015-11-26 DIAGNOSIS — E162 Hypoglycemia, unspecified: Secondary | ICD-10-CM | POA: Diagnosis not present

## 2015-11-26 DIAGNOSIS — E1022 Type 1 diabetes mellitus with diabetic chronic kidney disease: Secondary | ICD-10-CM | POA: Diagnosis not present

## 2015-11-26 DIAGNOSIS — A419 Sepsis, unspecified organism: Secondary | ICD-10-CM | POA: Diagnosis not present

## 2015-11-26 DIAGNOSIS — N179 Acute kidney failure, unspecified: Secondary | ICD-10-CM | POA: Diagnosis not present

## 2015-11-26 DIAGNOSIS — J9601 Acute respiratory failure with hypoxia: Secondary | ICD-10-CM | POA: Diagnosis not present

## 2015-11-26 DIAGNOSIS — J449 Chronic obstructive pulmonary disease, unspecified: Secondary | ICD-10-CM | POA: Diagnosis not present

## 2015-11-26 LAB — GLUCOSE, CAPILLARY: Glucose-Capillary: 147 mg/dL — ABNORMAL HIGH (ref 65–99)

## 2015-11-26 MED ORDER — INSULIN GLARGINE 300 UNIT/ML ~~LOC~~ SOPN: 10.0000 [IU] | PEN_INJECTOR | Freq: Every day | SUBCUTANEOUS | Status: AC

## 2015-11-26 MED ORDER — AMOXICILLIN-POT CLAVULANATE 875-125 MG PO TABS: 1.0000 | ORAL_TABLET | Freq: Two times a day (BID) | ORAL | Status: AC

## 2015-11-26 NOTE — Progress Notes (Signed)
[facility] PROGRESS NOTE   PERETZ RODAS HWK:[id number] DOB: July 21, 1941 DOA: 11/22/2015 PCP: Margit Hanks, MD  HPI/Subjective: Patient was not feeling very well this morning, reporting feeling rough and holding his head. Control pain, likely can be discharged in a.m.  Assessment/Plan: Principal Problem:   Acute respiratory failure with hypoxia (HCC) Active Problems:   Hypothyroidism   Type 1 diabetes mellitus with neurological manifestations, uncontrolled (HCC)   Essential hypertension   COPD (chronic obstructive pulmonary disease) (HCC)   CAD- moderate3V disease at cath 7/15- medical Rx   Anemia- chronic   Seizures (HCC)   B12 deficiency   Dementia without behavioral disturbance   Acute encephalopathy   Acute kidney injury superimposed on chronic kidney disease (HCC)   Sepsis (HCC)   HCAP (healthcare-associated pneumonia)   DNR (do not resuscitate)   Acute respiratory failure with hypoxia Secondary to healthcare associated pneumonia in the setting of COPD.  Oxygen saturation level 50% on room air respiratory rate 31 on admission. ABG reveals a pH 7.27, PCO2 44.5 pO2 61. Placed on BiPAP, currently on 2 L of oxygen. Currently not on oxygen.  Hypoglycemia Blood sugar dropped all the way down to 27, treated with IV dextrose. Lantus insulin decreased to 5 units. Continue to check CBGs before meals and at bedtime.  Sepsis secondary to healthcare associated pneumonia See #1. Patient discharged 17 days ago after a hospitalization for pneumonia. pH is 7.27 on admission. Started on broad-spectrum antibiotics with vancomycin and cefepime. Aggressive hydration with IV fluids.  Healthcare associated pneumonia Chest x-ray is consistent with pneumonia, patient has hypoxic respiratory failure with O2 sats 50%. Started on vancomycin and cefepime. Continue bronchodilators, mucolytics, antitussives and oxygen as needed.  Hypoglycemia Facility reports CBG 30 at  initial evaluation. 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 Home regimen includes 3 units NovoLog 3 times a day with meals, sliding scale and 24 units Toujeo.  Of note last hospitalization patient treated for DKA Restart home regimen, follow closely.  COPD Not on home oxygen. Home meds to include nebulizers. Nebulizers every 4 hours as noted above  Acute encephalopathy in the setting of dementia.  History of same. Likely related to acute illness. Moving all extremities spontaneously. If no improvement as above results consider CT  Acute on chronic kidney disease stage III Creatinine 2.03 on admission. It appears his baseline 1.4. Likely related to above. This is back to baseline of 1.34.   Anemia Likely related to chronic disease. Hemoglobin on admission 9.4. Around baseline. Hemoglobin dropped to 7.8 with aggressive IV fluids hydration, likely secondary to hemodilution. If hemoglobin drops further we will transfuse blood.  Hypertension Patient hypotensive on admission. See above. Home medications include Lopressor  Peripheral vascular disease. Stable -Hold Plavix for now  Hypothyroidism. Recent TSH 1.7. -Continue Synthroid IV  Chronic diarrhea. Chart review indicates patient uses Imodium when necessary greater than 15 years. Stable at baseline  Renal mass right. Review indicates interval increase in size. -Follow-up with urology  History of seizures. No documentation of recent seizure activity. -Continue Keppra  Hypokalemia Repleted with oral supplements.   Code Status: DNR Family Communication: Plan discussed with the patient. Disposition Plan: Remains inpatient, transfer to telemetry. Diet: DIET DYS 3 Room service appropriate?: Yes; Fluid consistency:: Thin Diet - low sodium heart healthy  Consultants:  Palliative medicine team  Procedures:  None  Antibiotics:  Vancomycin and Zosyn (indicate start date, and stop date if known)   Objective: Filed  Vitals:   11/25/15  2228 11/26/15 0519  BP: 147/66 146/63  Pulse: 68 73  Temp:  97.5 F (36.4 C)  Resp: 14 16    Intake/Output Summary (Last 24 hours) at 11/26/15 1148 Last data filed at 11/26/15 0524  Gross per 24 hour  Intake     50 ml  Output    550 ml  Net   -500 ml   Filed Weights   11/23/15 0500 11/24/15 0500 11/25/15 0500  Weight: 58.1 kg (128 lb 1.4 oz) 57.9 kg (127 lb 10.3 oz) 57.652 kg (127 lb 1.6 oz)    Exam: General: Alert and awake, oriented x3, not in any acute distress. HEENT: anicteric sclera, pupils reactive to light and accommodation, EOMI CVS: S1-S2 clear, no murmur rubs or gallops Chest: clear to auscultation bilaterally, no wheezing, rales or rhonchi Abdomen: soft nontender, nondistended, normal bowel sounds, no organomegaly Extremities: no cyanosis, clubbing or edema noted bilaterally Neuro: Cranial nerves II-XII intact, no focal neurological deficits  Data Reviewed: Basic Metabolic Panel:  Recent Labs Lab 11/22/15 0441 11/22/15 0738 11/23/15 0309 11/24/15 0442 11/25/15 0250  NA 143  --  142 145 145  K 4.3  --  4.2 3.2* 3.8  CL 108  --  106 104 103  CO2 21*  --  GLUCOSE 90  --  266* 35* 104*  BUN 50*  --  32* 19 19  CREATININE 2.03* 2.02* 1.34* 0.94 1.03  CALCIUM 8.8*  --  8.7* 9.3 8.9  MG  --   --   --   --  2.0   Liver Function Tests:  Recent Labs Lab 11/22/15 0441 11/23/15 0309  AST 61* 39  ALT 39 31  ALKPHOS 81 78  BILITOT 0.2* 0.4  PROT 6.3* 6.0*  ALBUMIN 2.6* 2.2*   No results for input(s): LIPASE, AMYLASE in the last 168 hours. No results for input(s): AMMONIA in the last 168 hours. CBC:  Recent Labs Lab 11/22/15 0441 11/22/15 0738 11/23/15 0309 11/24/15 0442  WBC 3.9* 3.8* 17.4* 17.1*  NEUTROABS 3.2  --   --   --   HGB 9.4* 8.1* 7.8* 8.6*  HCT 28.7* 25.5* 23.7* 26.5*  MCV 92.3 90.7 88.8 88.9  PLT 273 226 251 323   Cardiac Enzymes:  Recent Labs Lab 11/22/15 0738 11/22/15 1706 11/22/15 2230    TROPONINI <0.03 0.05* <0.03   BNP (last 3 results)  Recent Labs  10/20/15 1043 11/22/15 0443  BNP 280.3* 262.2*    ProBNP (last 3 results) No results for input(s): PROBNP in the last 8760 hours.  CBG:  Recent Labs Lab 11/25/15 1153 11/25/15 1629 11/25/15 1752 11/25/15 2226 11/26/15 0634  GLUCAP 172* 57* 170* 174* 147*    Micro Recent Results (from the past 240 hour(s))  Blood Culture (routine x 2)     Status: None (Preliminary result)   Collection Time: 11/22/15  4:19 AM  Result Value Ref Range Status   Specimen Description BLOOD RIGHT ARM  Final   Special Requests BOTTLES DRAWN AEROBIC AND ANAEROBIC  Final   Culture NO GROWTH 3 DAYS  Final   Report Status PENDING  Incomplete  Blood Culture (routine x 2)     Status: None (Preliminary result)   Collection Time: 11/22/15  4:26 AM  Result Value Ref Range Status   Specimen Description BLOOD LEFT FOREARM  Final   Special Requests BOTTLES DRAWN AEROBIC ONLY  Final   Culture NO GROWTH 3 DAYS  Final  Report Status PENDING  Incomplete  Urine culture     Status: None   Collection Time: 11/22/15  5:18 AM  Result Value Ref Range Status   Specimen Description URINE, CLEAN CATCH  Final   Special Requests NONE  Final   Culture 1,000 COLONIES/mL INSIGNIFICANT GROWTH  Final   Report Status 11/23/2015 FINAL  Final  MRSA PCR Screening     Status: None   Collection Time: 11/22/15  4:49 PM  Result Value Ref Range Status   MRSA by PCR NEGATIVE NEGATIVE Final    Comment:        The GeneXpert MRSA Assay (FDA approved for NASAL specimens only), is one component of a comprehensive MRSA colonization surveillance program. It is not intended to diagnose MRSA infection nor to guide or monitor treatment for MRSA infections.      Studies: No results found.  Scheduled Meds: . clobetasol cream   Topical BID  . guaiFENesin  1,200 mg Oral BID  . insulin glargine  5 Units Subcutaneous Daily  . levETIRAcetam  1,000 mg  Intravenous Q12H  . morphine CONCENTRATE  10 mg Oral 4 times per day   Continuous Infusions:      Time spent: 35 minutes    [facility] A  [facility] Pager [phone] If 7PM-7AM, please contact night-coverage at [email or web address], password [facility] 11/26/2015, 11:48 AM  LOS: 4 days

## 2015-11-26 NOTE — Discharge Summary (Deleted)
BSW intern notified nurse of patient discharge this morning returning to [facility]. BSW intern notified the son Cleone Slim) as well. Supervisor Occupational psychologist notified facility and case Science writer.  BSW intern arrange for [facility] pick up at 10:00 a.m.  No further needs.   BSW intern is signing off...  Catheryn Bacon  BSW intern  [phone]

## 2015-11-26 NOTE — Progress Notes (Signed)
Pt being discharged to [facility] via transport. Pt oriented to self. VSS. Pt c/o no pain at this time. No signs of respiratory distress. Education complete and care plans resolved. IV removed with catheter intact and pt tolerated well. Pt d/c'd with foley per palliative care orders. No further issues at this time. Jillyn Hidden, RN

## 2015-11-26 NOTE — Progress Notes (Signed)
[facility] PROGRESS NOTE   Benjamin Hodge ZDG:[id number] DOB: 1940-12-03 DOA: 11/22/2015 PCP: Hennie Duos, MD  HPI/Subjective: Has had hypoglycemic episode of 27 this morning, reported that he is not feeling good. Palliative care met with family yesterday, treat for now, but he'll go to the nursing home with hospice services.  Assessment/Plan: Principal Problem:   Acute respiratory failure with hypoxia (HCC) Active Problems:   Hypothyroidism   Type 1 diabetes mellitus with neurological manifestations, uncontrolled (HCC)   Essential hypertension   COPD (chronic obstructive pulmonary disease) (HCC)   CAD- moderate3V disease at cath 7/15- medical Rx   Anemia- chronic   Seizures (HCC)   B12 deficiency   Dementia without behavioral disturbance   Acute encephalopathy   Acute kidney injury superimposed on chronic kidney disease ([place])   Sepsis ([place])   HCAP (healthcare-associated pneumonia)   DNR (do not resuscitate)   Acute respiratory failure with hypoxia Secondary to healthcare associated pneumonia in the setting of COPD.  Oxygen saturation level 50% on room air respiratory rate 31 on admission. ABG reveals a pH 7.27, PCO2 44.5 pO2 61. Placed on BiPAP, currently on 2 L of oxygen. Wean oxygen as needed.  Hypoglycemia Blood sugar dropped all the way down to 27, treated with IV dextrose. I will decrease Lantus insulin down to 5 units. Continue to check CBGs before meals and at bedtime.  Sepsis secondary to healthcare associated pneumonia See #1. Patient discharged 17 days ago after a hospitalization for pneumonia. pH is 7.27 on admission. Started on broad-spectrum antibiotics with vancomycin and cefepime. Aggressive hydration with IV fluids.  Healthcare associated pneumonia Chest x-ray is consistent with pneumonia, patient has hypoxic respiratory failure with O2 sats 50%. Started on vancomycin and cefepime. Continue bronchodilators, mucolytics, antitussives  and oxygen as needed.  Hypoglycemia Facility reports CBG 30 at initial evaluation. 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 Home regimen includes 3 units NovoLog 3 times a day with meals, sliding scale and 24 units Toujeo.  Of note last hospitalization patient treated for DKA Restart home regimen, follow closely.  COPD Not on home oxygen. Home meds to include nebulizers. Nebulizers every 4 hours as noted above  Acute encephalopathy in the setting of dementia.  History of same. Likely related to acute illness. Moving all extremities spontaneously. If no improvement as above results consider CT  Acute on chronic kidney disease stage III Creatinine 2.03 on admission. It appears his baseline 1.4. Likely related to above. This is back to baseline of 1.34.   Anemia Likely related to chronic disease. Hemoglobin on admission 9.4. Around baseline. Hemoglobin dropped to 7.8 with aggressive IV fluids hydration, likely secondary to hemodilution. If hemoglobin drops further we will transfuse blood.  Hypertension Patient hypotensive on admission. See above. Home medications include Lopressor  Peripheral vascular disease. Stable -Hold Plavix for now  Hypothyroidism. Recent TSH 1.7. -Continue Synthroid IV  Chronic diarrhea. Chart review indicates patient uses Imodium when necessary greater than 15 years. Stable at baseline  Renal mass right. Review indicates interval increase in size. -Follow-up with urology  History of seizures. No documentation of recent seizure activity. -Continue Keppra  Hypokalemia Repleted with oral supplements.  Code Status: DNR Family Communication: Plan discussed with the patient. Disposition Plan: Remains inpatient, transfer to telemetry. Diet: DIET DYS 3 Room service appropriate?: Yes; Fluid consistency:: Thin Diet - low sodium heart healthy  Consultants:  Palliative medicine team  Procedures:  None  Antibiotics:  Vancomycin and Zosyn  (indicate start date,  and stop date if known)   Objective: Filed Vitals:   11/25/15 2228 11/26/15 0519  BP: 147/66 146/63  Pulse: 68 73  Temp:  97.5 F (36.4 C)  Resp: 14 16    Intake/Output Summary (Last 24 hours) at 11/26/15 1143 Last data filed at 11/26/15 0524  Gross per 24 hour  Intake     50 ml  Output    550 ml  Net   -500 ml   Filed Weights   11/23/15 0500 11/24/15 0500 11/25/15 0500  Weight: 58.1 kg (128 lb 1.4 oz) 57.9 kg (127 lb 10.3 oz) 57.652 kg (127 lb 1.6 oz)    Exam: General: Alert and awake, oriented x3, not in any acute distress. HEENT: anicteric sclera, pupils reactive to light and accommodation, EOMI CVS: S1-S2 clear, no murmur rubs or gallops Chest: clear to auscultation bilaterally, no wheezing, rales or rhonchi Abdomen: soft nontender, nondistended, normal bowel sounds, no organomegaly Extremities: no cyanosis, clubbing or edema noted bilaterally Neuro: Cranial nerves II-XII intact, no focal neurological deficits  Data Reviewed: Basic Metabolic Panel:  Recent Labs Lab 11/22/15 0441 11/22/15 0738 11/23/15 0309 11/24/15 0442 11/25/15 0250  NA 143  --  142 145 145  K 4.3  --  4.2 3.2* 3.8  CL 108  --  106 104 103  CO2 21*  --  23 28 29  GLUCOSE 90  --  266* 35* 104*  BUN 50*  --  32* 19 19  CREATININE 2.03* 2.02* 1.34* 0.94 1.03  CALCIUM 8.8*  --  8.7* 9.3 8.9  MG  --   --   --   --  2.0   Liver Function Tests:  Recent Labs Lab 11/22/15 0441 11/23/15 0309  AST 61* 39  ALT 39 31  ALKPHOS 81 78  BILITOT 0.2* 0.4  PROT 6.3* 6.0*  ALBUMIN 2.6* 2.2*   No results for input(s): LIPASE, AMYLASE in the last 168 hours. No results for input(s): AMMONIA in the last 168 hours. CBC:  Recent Labs Lab 11/22/15 0441 11/22/15 0738 11/23/15 0309 11/24/15 0442  WBC 3.9* 3.8* 17.4* 17.1*  NEUTROABS 3.2  --   --   --   HGB 9.4* 8.1* 7.8* 8.6*  HCT 28.7* 25.5* 23.7* 26.5*  MCV 92.3 90.7 88.8 88.9  PLT 273 226 251 323   Cardiac  Enzymes:  Recent Labs Lab 11/22/15 0738 11/22/15 1706 11/22/15 2230  TROPONINI <0.03 0.05* <0.03   BNP (last 3 results)  Recent Labs  10/20/15 1043 11/22/15 0443  BNP 280.3* 262.2*    ProBNP (last 3 results) No results for input(s): PROBNP in the last 8760 hours.  CBG:  Recent Labs Lab 11/25/15 1153 11/25/15 1629 11/25/15 1752 11/25/15 2226 11/26/15 0634  GLUCAP 172* 57* 170* 174* 147*    Micro Recent Results (from the past 240 hour(s))  Blood Culture (routine x 2)     Status: None (Preliminary result)   Collection Time: 11/22/15  4:19 AM  Result Value Ref Range Status   Specimen Description BLOOD RIGHT ARM  Final   Special Requests BOTTLES DRAWN AEROBIC AND ANAEROBIC 5ML  Final   Culture NO GROWTH 3 DAYS  Final   Report Status PENDING  Incomplete  Blood Culture (routine x 2)     Status: None (Preliminary result)   Collection Time: 11/22/15  4:26 AM  Result Value Ref Range Status   Specimen Description BLOOD LEFT FOREARM  Final   Special Requests BOTTLES DRAWN AEROBIC ONLY 5ML    Final   Culture NO GROWTH 3 DAYS  Final   Report Status PENDING  Incomplete  Urine culture     Status: None   Collection Time: 11/22/15  5:18 AM  Result Value Ref Range Status   Specimen Description URINE, CLEAN CATCH  Final   Special Requests NONE  Final   Culture 1,000 COLONIES/mL INSIGNIFICANT GROWTH  Final   Report Status 11/23/2015 FINAL  Final  MRSA PCR Screening     Status: None   Collection Time: 11/22/15  4:49 PM  Result Value Ref Range Status   MRSA by PCR NEGATIVE NEGATIVE Final    Comment:        The GeneXpert MRSA Assay (FDA approved for NASAL specimens only), is one component of a comprehensive MRSA colonization surveillance program. It is not intended to diagnose MRSA infection nor to guide or monitor treatment for MRSA infections.      Studies: No results found.  Scheduled Meds: . clobetasol cream   Topical BID  . guaiFENesin  1,200 mg Oral BID  .  insulin glargine  5 Units Subcutaneous Daily  . levETIRAcetam  1,000 mg Intravenous Q12H  . morphine CONCENTRATE  10 mg Oral 4 times per day   Continuous Infusions:      Time spent: 35 minutes    [facility] A  [facility] Pager [phone] If 7PM-7AM, please contact night-coverage at [email or web address], password [facility] 11/26/2015, 11:43 AM  LOS: 4 days

## 2015-11-26 NOTE — Progress Notes (Signed)
[facility] PROGRESS NOTE   ARUL FARABEE ZOX:[id number] DOB: 05-Sep-1941 DOA: 11/22/2015 PCP: Margit Hanks, MD  HPI/Subjective: Seen while he was eating, patient is very frustrated about how easily he gets pneumonia.  Assessment/Plan: Principal Problem:   Acute respiratory failure with hypoxia (HCC) Active Problems:   Hypothyroidism   Type 1 diabetes mellitus with neurological manifestations, uncontrolled (HCC)   Essential hypertension   COPD (chronic obstructive pulmonary disease) (HCC)   CAD- moderate3V disease at cath 7/15- medical Rx   Anemia- chronic   Seizures (HCC)   B12 deficiency   Dementia without behavioral disturbance   Acute encephalopathy   Acute kidney injury superimposed on chronic kidney disease (HCC)   Sepsis (HCC)   HCAP (healthcare-associated pneumonia)   DNR (do not resuscitate)   Acute respiratory failure with hypoxia Secondary to healthcare associated pneumonia in the setting of COPD.  Oxygen saturation level 50% on room air respiratory rate 31 on admission. ABG reveals a pH 7.27, PCO2 44.5 pO2 61. Placed on BiPAP, currently on 2 L of oxygen. Wean oxygen as needed.  Sepsis secondary to healthcare associated pneumonia See #1. Patient discharged 17 days ago after a hospitalization for pneumonia. pH is 7.27 on admission. Started on broad-spectrum antibiotics with vancomycin and cefepime. Aggressive hydration with IV fluids.  Healthcare associated pneumonia Chest x-ray is consistent with pneumonia, patient has hypoxic respiratory failure with O2 sats 50%. Started on vancomycin and cefepime. Continue bronchodilators, mucolytics, antitussives and oxygen as needed.  Hypoglycemia Facility reports CBG 30 at initial evaluation. 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 Home regimen includes 3 units NovoLog 3 times a day with meals, sliding scale and 24 units Toujeo.  Of note last hospitalization patient treated for DKA Restart home  regimen, follow closely.  COPD Not on home oxygen. Home meds to include nebulizers. Nebulizers every 4 hours as noted above  Acute encephalopathy in the setting of dementia.  History of same. Likely related to acute illness. Moving all extremities spontaneously. If no improvement as above results consider CT  Acute on chronic kidney disease stage III Creatinine 2.03 on admission. It appears his baseline 1.4. Likely related to above. This is back to baseline of 1.34.   Anemia Likely related to chronic disease. Hemoglobin on admission 9.4. Around baseline. Hemoglobin dropped to 7.8 with aggressive IV fluids hydration, likely secondary to hemodilution. If hemoglobin drops further we will transfuse blood.  Hypertension Patient hypotensive on admission. See above. Home medications include Lopressor  Peripheral vascular disease. Stable -Hold Plavix for now  Hypothyroidism. Recent TSH 1.7. -Continue Synthroid IV  Chronic diarrhea. Chart review indicates patient uses Imodium when necessary greater than 15 years. Stable at baseline  Renal mass right. Review indicates interval increase in size. -Follow-up with urology  History of seizures. No documentation of recent seizure activity. -Continue Keppra  Code Status: DNR Family Communication: Plan discussed with the patient. Disposition Plan: Remains inpatient, transfer to telemetry. Diet: DIET DYS 3 Room service appropriate?: Yes; Fluid consistency:: Thin Diet - low sodium heart healthy  Consultants:  Palliative medicine team  Procedures:  None  Antibiotics:  Vancomycin and Zosyn (indicate start date, and stop date if known)   Objective: Filed Vitals:   11/25/15 2228 11/26/15 0519  BP: 147/66 146/63  Pulse: 68 73  Temp:  97.5 F (36.4 C)  Resp: 14 16    Intake/Output Summary (Last 24 hours) at 11/26/15 1150 Last data filed at 11/26/15 0524  Gross per 24 hour  Intake  50 ml  Output    550 ml  Net   -500 ml     Filed Weights   11/23/15 0500 11/24/15 0500 11/25/15 0500  Weight: 58.1 kg (128 lb 1.4 oz) 57.9 kg (127 lb 10.3 oz) 57.652 kg (127 lb 1.6 oz)    Exam: General: Alert and awake, oriented x3, not in any acute distress. HEENT: anicteric sclera, pupils reactive to light and accommodation, EOMI CVS: S1-S2 clear, no murmur rubs or gallops Chest: clear to auscultation bilaterally, no wheezing, rales or rhonchi Abdomen: soft nontender, nondistended, normal bowel sounds, no organomegaly Extremities: no cyanosis, clubbing or edema noted bilaterally Neuro: Cranial nerves II-XII intact, no focal neurological deficits  Data Reviewed: Basic Metabolic Panel:  Recent Labs Lab 11/22/15 0441 11/22/15 0738 11/23/15 0309 11/24/15 0442 11/25/15 0250  NA 143  --  142 145 145  K 4.3  --  4.2 3.2* 3.8  CL 108  --  106 104 103  CO2 21*  --  23 28 29   GLUCOSE 90  --  266* 35* 104*  BUN 50*  --  32* 19 19  CREATININE 2.03* 2.02* 1.34* 0.94 1.03  CALCIUM 8.8*  --  8.7* 9.3 8.9  MG  --   --   --   --  2.0   Liver Function Tests:  Recent Labs Lab 11/22/15 0441 11/23/15 0309  AST 61* 39  ALT 39 31  ALKPHOS 81 78  BILITOT 0.2* 0.4  PROT 6.3* 6.0*  ALBUMIN 2.6* 2.2*   No results for input(s): LIPASE, AMYLASE in the last 168 hours. No results for input(s): AMMONIA in the last 168 hours. CBC:  Recent Labs Lab 11/22/15 0441 11/22/15 0738 11/23/15 0309 11/24/15 0442  WBC 3.9* 3.8* 17.4* 17.1*  NEUTROABS 3.2  --   --   --   HGB 9.4* 8.1* 7.8* 8.6*  HCT 28.7* 25.5* 23.7* 26.5*  MCV 92.3 90.7 88.8 88.9  PLT 273 226 251 323   Cardiac Enzymes:  Recent Labs Lab 11/22/15 0738 11/22/15 1706 11/22/15 2230  TROPONINI <0.03 0.05* <0.03   BNP (last 3 results)  Recent Labs  10/20/15 1043 11/22/15 0443  BNP 280.3* 262.2*    ProBNP (last 3 results) No results for input(s): PROBNP in the last 8760 hours.  CBG:  Recent Labs Lab 11/25/15 1153 11/25/15 1629 11/25/15 1752  11/25/15 2226 11/26/15 0634  GLUCAP 172* 57* 170* 174* 147*    Micro Recent Results (from the past 240 hour(s))  Blood Culture (routine x 2)     Status: None (Preliminary result)   Collection Time: 11/22/15  4:19 AM  Result Value Ref Range Status   Specimen Description BLOOD RIGHT ARM  Final   Special Requests BOTTLES DRAWN AEROBIC AND ANAEROBIC  Final   Culture NO GROWTH 3 DAYS  Final   Report Status PENDING  Incomplete  Blood Culture (routine x 2)     Status: None (Preliminary result)   Collection Time: 11/22/15  4:26 AM  Result Value Ref Range Status   Specimen Description BLOOD LEFT FOREARM  Final   Special Requests BOTTLES DRAWN AEROBIC ONLY  Final   Culture NO GROWTH 3 DAYS  Final   Report Status PENDING  Incomplete  Urine culture     Status: None   Collection Time: 11/22/15  5:18 AM  Result Value Ref Range Status   Specimen Description URINE, CLEAN CATCH  Final   Special Requests NONE  Final   Culture 1,000 COLONIES/mL  INSIGNIFICANT GROWTH  Final   Report Status 11/23/2015 FINAL  Final  MRSA PCR Screening     Status: None   Collection Time: 11/22/15  4:49 PM  Result Value Ref Range Status   MRSA by PCR NEGATIVE NEGATIVE Final    Comment:        The GeneXpert MRSA Assay (FDA approved for NASAL specimens only), is one component of a comprehensive MRSA colonization surveillance program. It is not intended to diagnose MRSA infection nor to guide or monitor treatment for MRSA infections.      Studies: No results found.  Scheduled Meds: . clobetasol cream   Topical BID  . guaiFENesin  1,200 mg Oral BID  . insulin glargine  5 Units Subcutaneous Daily  . levETIRAcetam  1,000 mg Intravenous Q12H  . morphine CONCENTRATE  10 mg Oral 4 times per day   Continuous Infusions:      Time spent: 35 minutes    [facility]  [facility] Pager [phone] If 7PM-7AM, please contact night-coverage at [email or web address], password [facility] 11/26/2015, 11:50 AM   LOS: 4 days

## 2015-11-26 NOTE — Discharge Summary (Addendum)
Physician Discharge Summary  Benjamin Hodge:[id number] DOB: [birth year] DOA: 11/22/2015  PCP: Margit Hanks, MD  Admit date: 11/22/2015 Discharge date: 11/26/2015  Time spent: 40 minutes  Recommendations for Outpatient Follow-up:  1. Follow-up with primary care physician in the nursing home. 2. Palliative service to follow-up in the nursing home, MOST form inclosed. 3. Augmentin for 5 more days.   Discharge Diagnoses:  Principal Problem:   Acute respiratory failure with hypoxia (HCC) Active Problems:   Hypothyroidism   Type 1 diabetes mellitus with neurological manifestations, uncontrolled (HCC)   Essential hypertension   COPD (chronic obstructive pulmonary disease) (HCC)   CAD- moderate3V disease at cath 7/15- medical Rx   Anemia- chronic   Seizures (HCC)   B12 deficiency   Dementia without behavioral disturbance   Acute encephalopathy   Acute kidney injury superimposed on chronic kidney disease (HCC)   Sepsis (HCC)   HCAP (healthcare-associated pneumonia)   DNR (do not resuscitate)   Discharge Condition: Stable  Diet recommendation: Stage III with thin liquids.  Filed Weights   11/23/15 0500 11/24/15 0500 11/25/15 0500  Weight: 58.1 kg (128 lb 1.4 oz) 57.9 kg (127 lb 10.3 oz) 57.652 kg (127 lb 1.6 oz)    History of present illness:  Benjamin Hodge is a 75 y.o. male with past medical history with a past medical history that includes recent pneumonia, uncontrolled diabetes, COPD, chronic kidney disease, CAD, seizure disorder, hypertension since to the emergency department from facility with the chief complaint of respiratory distress and hypoglycemia. Initial evaluation reveals acute respiratory failure with hypoxia, sepsis related to healthcare associated pneumonia. Information is obtained from the chart and the ED staff as patient's medical condition renders him unable to provide information. Patient was sent to the emergency department per EMS 1 facility  evaluated patient and found his blood sugar to be in the 30s. He was given guaiac, by the staff prior to EMS arrival. EMS reports upon their arrival patient was in severe respiratory distress with oxygen saturation level 51%. In addition he was altered and hypotensive. They placed him on a nonrebreather and gave 500 mL of IVF during transport. Chart review indicates patient discharged to facility 18 days ago after hospitalization for healthcare associated pneumonia and uncontrolled diabetes. Chart review indicates 4 days ago patient had increased cough as uncontrolled blood sugars. In the emergency department rectal temp 99.1 he's hypotensive demonstrates tachypnea respiratory distress with hypoxia, tachycardic.  Hospital Course:   Healthcare associated pneumonia Chest x-ray is consistent with pneumonia, patient has hypoxic respiratory failure with O2 sats 50%. Started on vancomycin and cefepime. Continue bronchodilators, mucolytics, antitussives and oxygen as needed. Discharged home on 5 more days of Augmentin.  Hypoglycemia Facility reports CBG 30 at initial evaluation. 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 Home regimen includes 3 units NovoLog 3 times a day with meals, sliding scale and 24 units Toujeo.  Of note last hospitalization patient treated for DKA Tojeo insulin dose decreased to 10 units instead of 24.  Acute respiratory failure with hypoxia Patient oxygen saturation was in the 80s on admission, placed on rebreather mask riefly. This is likely secondary to the healthcare associated pneumonia, this is resolved.  COPD Not on home oxygen. Home meds to include nebulizers. Nebulizers every 4 hours as noted above  Acute encephalopathy in the setting of dementia.  History of same. Likely related to acute illness. Moving all extremities spontaneously. If no improvement as above results consider CT  Acute on chronic  kidney disease stage III Creatinine 2.03 on admission. It  appears his baseline 1.4. Likely related to above. This is back to baseline of 1.34.   Anemia Likely related to chronic disease. Hemoglobin on admission 9.4. Around baseline. Hemoglobin dropped to 7.8 with aggressive IV fluids hydration, likely secondary to hemodilution. If hemoglobin drops further we will transfuse blood.  Hypertension Patient hypotensive on admission. See above. Home medications include Lopressor  Peripheral vascular disease. Stable -Hold Plavix for now  Hypothyroidism. Recent TSH 1.7. -Continue Synthroid IV  Chronic diarrhea. Chart review indicates patient uses Imodium when necessary greater than 15 years. Stable at baseline  Renal mass right. Review indicates interval increase in size. -Follow-up with urology  History of seizures. No documentation of recent seizure activity. -Continue Keppra  Hypokalemia Repleted with oral supplements.  Palliative Seen by palliative care and recommended the following Continue to treat treatable in the hospital. Return to her nursing home, he will be comfort once he returned to the facility. MOST form completed please follow its obstruction. Does not want PEG tube. Does not want BiPAP or intubation, he is DO NOT RESUSCITATE.   Procedures:  None  Consultations:  Palliative medicine.  Discharge Exam: Filed Vitals:   11/25/15 2228 11/26/15 0519  BP: 147/66 146/63  Pulse: 68 73  Temp:  97.5 F (36.4 C)  Resp: 14 16   General: Alert and awake, not in any acute distress. HEENT: anicteric sclera, pupils reactive to light and accommodation, EOMI CVS: S1-S2 clear, no murmur rubs or gallops Chest: clear to auscultation bilaterally, no wheezing, rales or rhonchi Abdomen: soft nontender, nondistended, normal bowel sounds, no organomegaly Extremities: no cyanosis, clubbing or edema noted bilaterally Neuro: Cranial nerves II-XII intact, no focal neurological deficits  Discharge Instructions  .   Medication List     STOP taking these medications        levofloxacin 500 MG tablet  Commonly known as:  LEVAQUIN     loperamide 2 MG capsule  Commonly known as:  IMODIUM      TAKE these medications        amoxicillin-clavulanate 875-125 MG tablet  Commonly known as:  AUGMENTIN  Take 1 tablet by mouth 2 (two) times daily.     aspirin 81 MG tablet  Take 81 mg by mouth at bedtime.     budesonide-formoterol 160-4.5 MCG/ACT inhaler  Commonly known as:  SYMBICORT  Inhale 2 puffs into the lungs 2 (two) times daily.     clopidogrel 75 MG tablet  Commonly known as:  PLAVIX  TAKE 1 TABLET BY MOUTH DAILY WITH BREAKFAST     diphenoxylate-atropine 2.5-0.025 MG tablet  Commonly known as:  LOMOTIL  Take 1 tablet by mouth every 6 (six) hours as needed for diarrhea or loose stools.     escitalopram 20 MG tablet  Commonly known as:  LEXAPRO  TAKE ONE TABLET BY MOUTH ONE TIME DAILY     insulin aspart 100 UNIT/ML injection  Commonly known as:  novoLOG  CBG 151-200= 2 units; 201- 250 = 3 units; 251-300 = 5 units; 301- 350 =7 units; 351- 400= 9 units; 401- 450 = 11 units. CBG > 450, Call MD.     insulin aspart 100 UNIT/ML injection  Commonly known as:  novoLOG  Inject 4 Units into the skin 3 (three) times daily with meals.     Insulin Glargine 300 UNIT/ML Sopn  Commonly known as:  TOUJEO SOLOSTAR  Inject 10 Units into the skin at bedtime.  levETIRAcetam 1000 MG tablet  Commonly known as:  KEPPRA  Take 1 tablet (1,000 mg total) by mouth 2 (two) times daily. For seizures     levothyroxine 75 MCG tablet  Commonly known as:  SYNTHROID, LEVOTHROID  Take 1 tablet (75 mcg total) by mouth daily.     metoprolol tartrate 25 MG tablet  Commonly known as:  LOPRESSOR  Take 25 mg by mouth 2 (two) times daily.     multivitamin with minerals Tabs tablet  Take 1 tablet by mouth daily.     pantoprazole 40 MG tablet  Commonly known as:  PROTONIX  TAKE ONE TABLET BY MOUTH ONE TIME DAILY     pravastatin  20 MG tablet  Commonly known as:  PRAVACHOL  Take 1 tablet (20 mg total) by mouth every evening. NEEDS APPOINTMENT FOR FUTURE REFILLS OR 90-DAY SUPPLY     pregabalin 100 MG capsule  Commonly known as:  LYRICA  Take 100 mg by mouth at bedtime.     pregabalin 50 MG capsule  Commonly known as:  LYRICA  Take 50 mg by mouth daily.     saccharomyces boulardii 250 MG capsule  Commonly known as:  FLORASTOR  Take 1 capsule (250 mg total) by mouth every evening.     tiotropium 18 MCG inhalation capsule  Commonly known as:  SPIRIVA  Place 1 capsule (18 mcg total) into inhaler and inhale daily.     vitamin B-12 1000 MCG tablet  Commonly known as:  CYANOCOBALAMIN  Take 1,000 mcg by mouth daily.       Allergies  Allergen Reactions  . Ace Inhibitors Other (See Comments)    dizzy  . Cymbalta [Duloxetine Hcl] Other (See Comments)    dizzy  . Lipitor [Atorvastatin] Other (See Comments)    Caused pain all over body.  . Bee Venom Swelling  . Glucerna [Nutritional Supplements] Diarrhea  . Angiotensin Receptor Blockers Other (See Comments)    unknown   Follow-up Information    Follow up with [facility].   Specialty:  Skilled Nursing Facility   Contact information:   [address] [place] [address] [phone]       The results of significant diagnostics from this hospitalization (including imaging, microbiology, ancillary and laboratory) are listed below for reference.    Significant Diagnostic Studies: Ct Abdomen Pelvis Wo Contrast  10/28/2015  CLINICAL DATA:  Generalized weakness and diarrhea for 4-5 weeks. EXAM: CT ABDOMEN AND PELVIS WITHOUT CONTRAST TECHNIQUE: Multidetector CT imaging of the abdomen and pelvis was performed following the standard protocol without IV contrast. COMPARISON:  Abdominal MRI from 10/02/2015 FINDINGS: Lower chest: Emphysema with underlying chronic interstitial coarsening and basilar atelectasis.  Hepatobiliary: No focal abnormality in the liver on this study without intravenous contrast. No evidence of hepatomegaly. There is no evidence for gallstones, gallbladder wall thickening, or pericholecystic fluid. No intrahepatic or extrahepatic biliary dilation. Pancreas: No focal mass lesion. No dilatation of the main duct. No intraparenchymal cyst. No peripancreatic edema. Spleen: No splenomegaly. No focal mass lesion. Adrenals/Urinary Tract: No adrenal nodule or mass. Atherosclerotic calcification is seen and arterial anatomy of the right renal hilum. 3 mm nonobstructing stone is seen in the lower pole of the right kidney. 11 mm water density lesion in the interpolar right kidney is compatible with a cyst as was seen on the previous MRI. 16 mm mildly enhancing right renal lesions seen on previous MRI is difficult to see on today's uninfused  CT scan, but appears to measure about 2.2 cm Left kidney is unremarkable on this uninfused exam. No evidence for hydronephrosis. No evidence for hydroureter. Mild bladder wall thickening is evident. Stomach/Bowel: Stomach is nondistended, likely accounting for the apparent wall thickening. Duodenum diverticulum noted. No small bowel dilatation. Small bowel is not well distended which makes small bowel wall thickness difficult to assess, but despite this limitation, there does appear to be some true circumferential wall thickening in the terminal ileum. There may also be some abnormal wall thickening in the cecum, at the level of the ileocecal valve (see image 51 of series 2). The appendix is normal. Abnormal wall thickening is present in the rectum (see images 65 -72 of series 2). Vascular/Lymphatic: There is abdominal aortic atherosclerosis without aneurysm. There is no gastrohepatic or hepatoduodenal ligament lymphadenopathy. No intraperitoneal or retroperitoneal lymphadenopathy. No pelvic sidewall lymphadenopathy. Reproductive: Prostate gland is unremarkable. Penile  prosthesis is evident. Other: No intraperitoneal free fluid. Musculoskeletal: Bone windows reveal no worrisome lytic or sclerotic osseous lesions. IMPRESSION: 1. Apparent circumferential wall thickening in the terminal ileum and cecum at the level of the ileocecal valve. This is associated with more definite wall thickening in the rectum. Given the history of diarrhea, infectious/inflammatory enterocolitis would be a consideration. Neoplasm cannot be excluded. 2. Apparent interval increase in size of the right renal lesion evaluated by previous MRI and and found to be suspicious for possible papillary type renal cell carcinoma at that time. This is difficult to assess on today's noncontrast exam. Electronically Signed   By: Kennith Center M.D.   On: 10/28/2015 20:15   Dg Chest 1 View  10/28/2015  CLINICAL DATA:  Altered mental status.  Lethargy.  Cough. EXAM: CHEST 1 VIEW COMPARISON:  10/18/2015.  09/27/2015. FINDINGS: Heart size is normal with some left ventricular prominence. There is calcification of the aorta. There is chronic elevation of left hemidiaphragm. There is increased density at the left base when compared to previous studies consistent with left lower lobe pneumonia. No lobar collapse. No effusions. IMPRESSION: Chronically elevated left hemidiaphragm. Newly seen left base infiltrate consistent with bronchopneumonia. Electronically Signed   By: Paulina Fusi M.D.   On: 10/28/2015 15:50   Ct Head Wo Contrast  10/28/2015  CLINICAL DATA:  Generalized weakness and altered mental status. Pt has no complaints of pain or injury Hx: HTN, Diabetes, MI, Chronic fatigue EXAM: CT HEAD WITHOUT CONTRAST TECHNIQUE: Contiguous axial images were obtained from the base of the skull through the vertex without intravenous contrast. COMPARISON:  10/18/2015 FINDINGS: Brain: Mild atrophy. No evidence of acute infarction, hemorrhage, extra-axial collection, ventriculomegaly, or mass effect. Vascular: No hyperdense vessel  or unexpected calcification. Atherosclerotic and physiologic intracranial calcifications. Skull: Negative for fracture or focal lesion. Sinuses/Orbits: No acute findings. Other: None. IMPRESSION: 1. Negative for bleed or other acute intracranial process. Electronically Signed   By: Corlis Leak M.D.   On: 10/28/2015 16:16   Dg Chest Portable 1 View  11/22/2015  CLINICAL DATA:  Respiratory distress EXAM: PORTABLE CHEST 1 VIEW COMPARISON:  10/28/2015 FINDINGS: There is chronic elevation of the left diaphragm. Patchy lower lung opacity above baseline that is primarily concerning for pneumonia, especially medially on the right. Normal heart size and aortic contours when allowing for technique. IMPRESSION: Bibasilar pneumonia. Electronically Signed   By: Marnee Spring M.D.   On: 11/22/2015 04:39   Dg Swallowing Func-speech Pathology  10/31/2015  Objective Swallowing Evaluation: Type of Study: MBS-Modified Barium Swallow Study Patient Details Name: Dhruv  TIMON FOGLIA MRN: [id number] Date of Birth: 08-25-1941 Today's Date: 10/31/2015 Time: SLP Start Time (ACUTE ONLY): 1032-SLP Stop Time (ACUTE ONLY): 1047 SLP Time Calculation (min) (ACUTE ONLY): 15 min Past Medical History: Past Medical History Diagnosis Date . CAD (coronary artery disease)    LAD 40-50% stenosis, first diagonal small 90% stenosis, circumflex 70% stenosis, OM 80% stenosis, right coronary artery 80-90% stenosis. . Right upper lobe pneumonia 11/28/2008 . Anxiety 07/18/2008 . Irritable bowel syndrome 04/27/2008 . COPD (chronic obstructive pulmonary disease) (HCC) 02/27/2008 . B12 deficiency 02/16/2008   in 5/09: B12 262, MMA 1040 . Chronic diarrhea 01/01/2008   s/p EGD 10/06: H pylori + gastritis, duodenal biopsy normal (Dr. Elnoria Howard); s/p colonoscopy 10/06: hemorrhoids (Dr. Elnoria Howard); evaluated by Dr. Yancey Flemings  in 8/09: tissue transglutaminase AB negative, VIP normal, stool fat content normal, urine 5-HIAA normal; normal GES 11/09 (done for "fluctuating  sugars") . 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 Orthostatic hypotension 02/09/2007 . Hypertension 02/09/2007 . Spinal stenosis in cervical region 05/03   MRI . Erectile dysfunction 09/27/2006   s/p penile implant . Anemia, iron deficiency 09/27/2006   neg colonoscopy 2006 - Dr. Elnoria Howard; ferritin 152; hgb  15.7  on 07/07 . Hypothyroidism 09/27/2006   TSH 2.639  07/07 . Hypersomnia 09/27/2006   evaluated by Dr. Jetty Duhamel (5/08) and Dr. Vickey Huger; PSG 8/09: chronic delayed sleep phase syndrome (patient sleeps during the day and is awake at night), nocturnal myoclonus (eval for RLS and IDA suggested). Pt advised to change sleeping behavior . Diabetes mellitus type II 09/07/1984   poorly controlled, complicated by peripheral neuropathy, microalbuminuria, mild non proliferative retinopathy, s/p DKA 7/05, on insulin pump x 4/09, started a pump vacation on 11/19/2008 . Hyperlipidemia  . Hypogonadism male 08/2011 . Hemorrhoids  . Chronic kidney disease  . Unspecified hereditary and idiopathic peripheral neuropathy  . Other chronic pain  . Other malaise and fatigue  . Depression  . Hypertrophy of prostate without urinary obstruction and other lower urinary tract symptoms (LUTS)  . Lumbago  . Heart attack (HCC) 03/2014   mild . Renal mass, right: Incentendal finding MRI 10/02/2015 10/03/2015 . Hypogonadism in male 10/03/2015 . Asthma  Past Surgical History: Past Surgical History Procedure Laterality Date . Penile prosthesis implant   . Tonsillectomy   . Colonoscopy  06/25/2005   Dr. Jeani Hawking . Left heart catheterization with coronary angiogram [name] 03/13/2014   Procedure: LEFT HEART CATHETERIZATION WITH CORONARY ANGIOGRAM;  Surgeon: Peter M Swaziland, MD;  Location: [facility] CATH LAB;  Service: Cardiovascular;  Laterality: [name]; . Peripheral vascular catheterization [name] 02/19/2015   Procedure: Abdominal Aortogram;  Surgeon: Nada Libman, MD;  Location: [facility] INVASIVE CV LAB;  Service: Cardiovascular;  Laterality:  [name]; . Peripheral vascular catheterization Left 02/19/2015   Procedure: Lower Extremity Angiography;  Surgeon: Nada Libman, MD;  Location: [facility] INVASIVE CV LAB;  Service: Cardiovascular;  Laterality: Left; HPI: 75 y.o. male with h/o CAD, recurrent pneumonia, COPD, HTN, DM type 2, HLD, depression, asthma and "mild" heart attack (2015) who presented to ED 2/20 after syncopal episode at endocrinologist's office. Pt admitted last week for pneumonia and uncontrolled DM. CT Head 2/20 negative for bleed or other acute intracranial process. CXR 2/20 newly seen L base infiltrate consistent with bronchopneumonia. BSE 10/21/2015 recommended regular diet and thin liquids. No Data Recorded Assessment / Plan / Recommendation CHL IP CLINICAL IMPRESSIONS 10/31/2015 Therapy Diagnosis Mild pharyngeal phase dysphagia Clinical Impression Pt exhibited mild pharyngeal phase dysphagia characterized by sensorimotor deficits. During intake  of thin liquids via straw, silent penetration to the vocal folds observed due to decreased laryngeal elevation and epiglottic deflection. SLP provided min verbal cues to clear. Further penetration episodes prevented with removal of straw. Min residue noted in the vallecula and pyriform sinuses and with min verbal cues pt able to initiate volitional swallow to clear. Mastication WFL. MBS does not diagnose MBS below UES, however scan of esophagus noted esophageal stasis appearing in distal toward mid esophagus. Pt educated re: diet recommendation of regular diet and thin liquids (no straws), meds whole in puree and esophageal precautions (eat upright, stay upright at least 45 minutes after PO intake, alternate solids and liquids) as well as swallow 2x intermittently throughout meal to clear residue. SLP will f/u to determine diet toleration. Suggest assessment of esophagus to evaluate stasis. Impact on safety and function Mild aspiration risk   CHL IP TREATMENT RECOMMENDATION 10/31/2015 Treatment  Recommendations Therapy as outlined in treatment plan below   Prognosis 10/31/2015 Prognosis for Safe Diet Advancement Good Barriers to Reach Goals -- Barriers/Prognosis Comment -- CHL IP DIET RECOMMENDATION 10/31/2015 SLP Diet Recommendations Regular solids;Thin liquid Liquid Administration via No straw;Cup Medication Administration Whole meds with puree Compensations Minimize environmental distractions;Slow rate;Small sips/bites;Follow solids with liquid Postural Changes Seated upright at 90 degrees;Remain semi-upright after after feeds/meals (Comment)   CHL IP OTHER RECOMMENDATIONS 10/31/2015 Recommended Consults Consider esophageal assessment Oral Care Recommendations Oral care BID Other Recommendations --   CHL IP FOLLOW UP RECOMMENDATIONS 10/31/2015 Follow up Recommendations (No Data)   CHL IP FREQUENCY AND DURATION 10/31/2015 Speech Therapy Frequency (ACUTE ONLY) min 2x/week Treatment Duration 2 weeks      CHL IP ORAL PHASE 10/31/2015 Oral Phase WFL Oral - Pudding Teaspoon -- Oral - Pudding Cup -- Oral - Honey Teaspoon -- Oral - Honey Cup -- Oral - Nectar Teaspoon -- Oral - Nectar Cup -- Oral - Nectar Straw -- Oral - Thin Teaspoon -- Oral - Thin Cup -- Oral - Thin Straw -- Oral - Puree -- Oral - Mech Soft -- Oral - Regular -- Oral - Multi-Consistency -- Oral - Pill -- Oral Phase - Comment --  CHL IP PHARYNGEAL PHASE 10/31/2015 Pharyngeal Phase Impaired Pharyngeal- Pudding Teaspoon -- Pharyngeal -- Pharyngeal- Pudding Cup -- Pharyngeal -- Pharyngeal- Honey Teaspoon -- Pharyngeal -- Pharyngeal- Honey Cup -- Pharyngeal -- Pharyngeal- Nectar Teaspoon -- Pharyngeal -- Pharyngeal- Nectar Cup -- Pharyngeal -- Pharyngeal- Nectar Straw -- Pharyngeal -- Pharyngeal- Thin Teaspoon -- Pharyngeal -- Pharyngeal- Thin Cup Penetration/Aspiration during swallow;Reduced airway/laryngeal closure;Reduced laryngeal elevation;Reduced epiglottic inversion;Pharyngeal residue - valleculae;Pharyngeal residue - pyriform;Reduced tongue base  retraction Pharyngeal Material enters airway, remains ABOVE vocal cords then ejected out Pharyngeal- Thin Straw Reduced epiglottic inversion;Reduced laryngeal elevation;Reduced airway/laryngeal closure;Reduced tongue base retraction;Penetration/Aspiration during swallow;Pharyngeal residue - valleculae;Pharyngeal residue - pyriform Pharyngeal Material enters airway, CONTACTS cords and not ejected out Pharyngeal- Puree Delayed swallow initiation-vallecula;Pharyngeal residue - valleculae;Reduced tongue base retraction Pharyngeal -- Pharyngeal- Mechanical Soft -- Pharyngeal -- Pharyngeal- Regular Delayed swallow initiation-vallecula;Reduced tongue base retraction;Pharyngeal residue - valleculae Pharyngeal -- Pharyngeal- Multi-consistency -- Pharyngeal -- Pharyngeal- Pill -- Pharyngeal -- Pharyngeal Comment --  CHL IP CERVICAL ESOPHAGEAL PHASE 10/31/2015 Cervical Esophageal Phase WFL Pudding Teaspoon -- Pudding Cup -- Honey Teaspoon -- Honey Cup -- Nectar Teaspoon -- Nectar Cup -- Nectar Straw -- Thin Teaspoon -- Thin Cup -- Thin Straw -- Puree -- Mechanical Soft -- Regular -- Multi-consistency -- Pill -- Cervical Esophageal Comment -- No flowsheet data found. Royce Macadamia 10/31/2015, 12:02 PM  Breck Coons [place] [phone]               Microbiology: Recent Results (from the past 240 hour(s))  Blood Culture (routine x 2)     Status: None (Preliminary result)   Collection Time: 11/22/15  4:19 AM  Result Value Ref Range Status   Specimen Description BLOOD RIGHT ARM  Final   Special Requests BOTTLES DRAWN AEROBIC AND ANAEROBIC  Final   Culture NO GROWTH 3 DAYS  Final   Report Status PENDING  Incomplete  Blood Culture (routine x 2)     Status: None (Preliminary result)   Collection Time: 11/22/15  4:26 AM  Result Value Ref Range Status   Specimen Description BLOOD LEFT FOREARM  Final   Special Requests BOTTLES DRAWN AEROBIC ONLY  Final   Culture NO GROWTH 3 DAYS  Final    Report Status PENDING  Incomplete  Urine culture     Status: None   Collection Time: 11/22/15  5:18 AM  Result Value Ref Range Status   Specimen Description URINE, CLEAN CATCH  Final   Special Requests NONE  Final   Culture 1,000 COLONIES/mL INSIGNIFICANT GROWTH  Final   Report Status 11/23/2015 FINAL  Final  MRSA PCR Screening     Status: None   Collection Time: 11/22/15  4:49 PM  Result Value Ref Range Status   MRSA by PCR NEGATIVE NEGATIVE Final    Comment:        The GeneXpert MRSA Assay (FDA approved for NASAL specimens only), is one component of a comprehensive MRSA colonization surveillance program. It is not intended to diagnose MRSA infection nor to guide or monitor treatment for MRSA infections.      Labs: Basic Metabolic Panel:  Recent Labs Lab 11/22/15 0441 11/22/15 0738 11/23/15 0309 11/24/15 0442 11/25/15 0250  NA 143  --  142 145 145  K 4.3  --  4.2 3.2* 3.8  CL 108  --  106 104 103  CO2 21*  --  23 28 29   GLUCOSE 90  --  266* 35* 104*  BUN 50*  --  32* 19 19  CREATININE 2.03* 2.02* 1.34* 0.94 1.03  CALCIUM 8.8*  --  8.7* 9.3 8.9  MG  --   --   --   --  2.0   Liver Function Tests:  Recent Labs Lab 11/22/15 0441 11/23/15 0309  AST 61* 39  ALT 39 31  ALKPHOS 81 78  BILITOT 0.2* 0.4  PROT 6.3* 6.0*  ALBUMIN 2.6* 2.2*   No results for input(s): LIPASE, AMYLASE in the last 168 hours. No results for input(s): AMMONIA in the last 168 hours. CBC:  Recent Labs Lab 11/22/15 0441 11/22/15 0738 11/23/15 0309 11/24/15 0442  WBC 3.9* 3.8* 17.4* 17.1*  NEUTROABS 3.2  --   --   --   HGB 9.4* 8.1* 7.8* 8.6*  HCT 28.7* 25.5* 23.7* 26.5*  MCV 92.3 90.7 88.8 88.9  PLT 273 226 251 323   Cardiac Enzymes:  Recent Labs Lab 11/22/15 0738 11/22/15 1706 11/22/15 2230  TROPONINI <0.03 0.05* <0.03   BNP: BNP (last 3 results)  Recent Labs  10/20/15 1043 11/22/15 0443  BNP 280.3* 262.2*    ProBNP (last 3 results) No results for input(s):  PROBNP in the last 8760 hours.  CBG:  Recent Labs Lab 11/25/15 1153 11/25/15 1629 11/25/15 1752 11/25/15 2226 11/26/15 0634  GLUCAP 172* 57* 170* 174* 147*  Signed:  Clint Lipps MD.  [facility] 11/26/2015, 11:25 AM

## 2015-11-27 ENCOUNTER — Non-Acute Institutional Stay (SKILLED_NURSING_FACILITY): Payer: Medicare Other | Admitting: Adult Health

## 2015-11-27 ENCOUNTER — Encounter: Payer: Self-pay | Admitting: Adult Health

## 2015-11-27 ENCOUNTER — Telehealth: Payer: Self-pay | Admitting: Internal Medicine

## 2015-11-27 DIAGNOSIS — E1051 Type 1 diabetes mellitus with diabetic peripheral angiopathy without gangrene: Secondary | ICD-10-CM

## 2015-11-27 DIAGNOSIS — I2510 Atherosclerotic heart disease of native coronary artery without angina pectoris: Secondary | ICD-10-CM

## 2015-11-27 DIAGNOSIS — M545 Low back pain, unspecified: Secondary | ICD-10-CM

## 2015-11-27 DIAGNOSIS — E034 Atrophy of thyroid (acquired): Secondary | ICD-10-CM | POA: Diagnosis not present

## 2015-11-27 DIAGNOSIS — E038 Other specified hypothyroidism: Secondary | ICD-10-CM

## 2015-11-27 DIAGNOSIS — R627 Adult failure to thrive: Secondary | ICD-10-CM

## 2015-11-27 DIAGNOSIS — J441 Chronic obstructive pulmonary disease with (acute) exacerbation: Secondary | ICD-10-CM | POA: Diagnosis not present

## 2015-11-27 DIAGNOSIS — J189 Pneumonia, unspecified organism: Secondary | ICD-10-CM | POA: Diagnosis not present

## 2015-11-27 DIAGNOSIS — I10 Essential (primary) hypertension: Secondary | ICD-10-CM | POA: Diagnosis not present

## 2015-11-27 DIAGNOSIS — F0150 Vascular dementia without behavioral disturbance: Secondary | ICD-10-CM

## 2015-11-27 LAB — CULTURE, BLOOD (ROUTINE X 2)
CULTURE: NO GROWTH
CULTURE: NO GROWTH

## 2015-11-27 NOTE — Progress Notes (Signed)
Patient ID: Benjamin Hodge, male   DOB: 1941/08/30, 75 y.o.   MRN: [id number]   Facility:  [facility]       Allergies  Allergen Reactions  . Ace Inhibitors Other (See Comments)    dizzy  . Cymbalta [Duloxetine Hcl] Other (See Comments)    dizzy  . Lipitor [Atorvastatin] Other (See Comments)    Caused pain all over body.  . Bee Venom Swelling  . Glucerna [Nutritional Supplements] Diarrhea  . Angiotensin Receptor Blockers Other (See Comments)    unknown    Chief Complaint  Patient presents with  . Hospitalization Follow-up    Hospital Follow up    HPI:  He has been hospitalized for pneumonia and acute respiratory failure. He is unable to participate in the hpi or ros. I have spoken with his daughter in law regarding his overall status. He is not eating or drinking at this time. She is aware of his poor prognosis and that his status is terminal. They do desire a palliative care consult at this time and want his care to focus upon comfort.    Past Medical History  Diagnosis Date  . CAD (coronary artery disease)     LAD 40-50% stenosis, first diagonal small 90% stenosis, circumflex 70% stenosis, OM 80% stenosis, right coronary artery 80-90% stenosis.  . Right upper lobe pneumonia 11/28/2008  . Anxiety 07/18/2008  . Irritable bowel syndrome 04/27/2008  . COPD (chronic obstructive pulmonary disease) (HCC) 02/27/2008  . B12 deficiency 02/16/2008    in 5/09: B12 262, MMA 1040  . Chronic diarrhea 01/01/2008    s/p EGD 10/06: H pylori + gastritis, duodenal biopsy normal (Dr. Elnoria Howard); s/p colonoscopy 10/06: hemorrhoids (Dr. Elnoria Howard); evaluated by Dr. Yancey Flemings  in 8/09: tissue transglutaminase AB negative, VIP normal, stool fat content normal, urine 5-HIAA normal; normal GES 11/09 (done for "fluctuating sugars")  . 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 Orthostatic hypotension 02/09/2007  . Hypertension 02/09/2007  . Spinal stenosis in cervical region 05/03     MRI  . Erectile dysfunction 09/27/2006    s/p penile implant  . Anemia, iron deficiency 09/27/2006    neg colonoscopy 2006 - Dr. Elnoria Howard; ferritin 152; hgb  15.7  on 07/07  . Hypothyroidism 09/27/2006    TSH 2.639  07/07  . Hypersomnia 09/27/2006    evaluated by Dr. Jetty Duhamel (5/08) and Dr. Vickey Huger; PSG 8/09: chronic delayed sleep phase syndrome (patient sleeps during the day and is awake at night), nocturnal myoclonus (eval for RLS and IDA suggested). Pt advised to change sleeping behavior  . Diabetes mellitus type II 09/07/1984    poorly controlled, complicated by peripheral neuropathy, microalbuminuria, mild non proliferative retinopathy, s/p DKA 7/05, on insulin pump x 4/09, started a pump vacation on 11/19/2008  . Hyperlipidemia   . 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 Hemorrhoids   . Chronic kidney disease   . Unspecified hereditary and idiopathic peripheral neuropathy   . Other chronic pain   . Other malaise and fatigue   . Depression   . Hypertrophy of prostate without urinary obstruction and other lower urinary tract symptoms (LUTS)   . Lumbago   . Heart attack (HCC) 03/2014    mild  . Renal mass, right: Incentendal finding MRI 10/02/2015 10/03/2015  . Hypogonadism in male 10/03/2015  . Asthma     Past Surgical History  Procedure Laterality Date  . Penile prosthesis implant    . Tonsillectomy    . Colonoscopy  06/25/2005  Dr. Jeani Hawking  . Left heart catheterization with coronary angiogram [name] 03/13/2014    Procedure: LEFT HEART CATHETERIZATION WITH CORONARY ANGIOGRAM;  Surgeon: Peter M Swaziland, MD;  Location: [facility] CATH LAB;  Service: Cardiovascular;  Laterality: [name];  . Peripheral vascular catheterization [name] 02/19/2015    Procedure: Abdominal Aortogram;  Surgeon: Nada Libman, MD;  Location: [facility] INVASIVE CV LAB;  Service: Cardiovascular;  Laterality: [name];  . Peripheral vascular catheterization Left 02/19/2015    Procedure: Lower Extremity Angiography;  Surgeon: Nada Libman, MD;  Location: [facility] INVASIVE CV LAB;  Service: Cardiovascular;  Laterality: Left;    VITAL SIGNS BP 109/69 mmHg  Pulse 63  Temp(Src) 97.1 F (36.2 C) (Oral)  Resp 19  Ht 5\' 8"  (1.727 m)  Wt 125 lb 4 oz (56.813 kg)  BMI 19.05 kg/m2  SpO2 99%  Patient's Medications  New Prescriptions   No medications on file  Previous Medications   AMOXICILLIN-CLAVULANATE (AUGMENTIN) 875-125 MG TABLET    Take 1 tablet by mouth 2 (two) times daily.   ASPIRIN 81 MG TABLET    Take 81 mg by mouth at bedtime.    BUDESONIDE-FORMOTEROL (SYMBICORT) 160-4.5 MCG/ACT INHALER    Inhale 2 puffs into the lungs 2 (two) times daily.   CLOPIDOGREL (PLAVIX) 75 MG TABLET    TAKE 1 TABLET BY MOUTH DAILY WITH BREAKFAST   DIPHENOXYLATE-ATROPINE (LOMOTIL) 2.5-0.025 MG TABLET    Take 1 tablet by mouth every 6 (six) hours as needed for diarrhea or loose stools.   ESCITALOPRAM (LEXAPRO) 20 MG TABLET    TAKE ONE TABLET BY MOUTH ONE TIME DAILY   INSULIN ASPART (NOVOLOG) 100 UNIT/ML INJECTION    Inject 4 Units into the skin 3 (three) times daily with meals.   INSULIN ASPART (NOVOLOG) 100 UNIT/ML INJECTION    CBG 151-200= 2 units; 201- 250 = 3 units; 251-300 = 5 units; 301- 350 =7 units; 351- 400= 9 units; 401- 450 = 11 units. CBG > 450, Call MD.   INSULIN GLARGINE (TOUJEO SOLOSTAR) 300 UNIT/ML SOPN    Inject 10 Units into the skin at bedtime.   LEVETIRACETAM (KEPPRA) 1000 MG TABLET    Take 1 tablet (1,000 mg total) by mouth 2 (two) times daily. For seizures   LEVOTHYROXINE (SYNTHROID, LEVOTHROID) 75 MCG TABLET    Take 1 tablet (75 mcg total) by mouth daily.   METOPROLOL TARTRATE (LOPRESSOR) 25 MG TABLET    Take 25 mg by mouth 2 (two) times daily.   MULTIPLE VITAMIN (MULTIVITAMIN WITH MINERALS) TABS TABLET    Take 1 tablet by mouth daily.   PANTOPRAZOLE (PROTONIX) 40 MG TABLET    TAKE ONE TABLET BY MOUTH ONE TIME DAILY   PRAVASTATIN (PRAVACHOL) 20 MG TABLET    Take 1 tablet (20 mg total) by mouth every evening. NEEDS  APPOINTMENT FOR FUTURE REFILLS OR 90-DAY SUPPLY   PREGABALIN (LYRICA) 100 MG CAPSULE    Take 100 mg by mouth at bedtime.   PREGABALIN (LYRICA) 50 MG CAPSULE    Take 50 mg by mouth daily.   SACCHAROMYCES BOULARDII (FLORASTOR) 250 MG CAPSULE    Take 1 capsule (250 mg total) by mouth every evening.   TIOTROPIUM (SPIRIVA) 18 MCG INHALATION CAPSULE    Place 1 capsule (18 mcg total) into inhaler and inhale daily.   VITAMIN B-12 (CYANOCOBALAMIN) 1000 MCG TABLET    Take 1,000 mcg by mouth daily.  Modified Medications   No medications on file  Discontinued Medications  SIGNIFICANT DIAGNOSTIC EXAMS  10-28-15: chest x-ray: Chronically elevated left hemidiaphragm. Newly seen left base infiltrate consistent with bronchopneumonia.  10-28-15: ct of abdomen : 1. Apparent circumferential wall thickening in the terminal ileum and cecum at the level of the ileocecal valve. This is associated with more definite wall thickening in the rectum. Given the history of diarrhea, infectious/inflammatory enterocolitis would be a consideration. Neoplasm cannot be excluded. 2. Apparent interval increase in size of the right renal lesion evaluated by previous MRI and and found to be suspicious for possible papillary type renal cell carcinoma at that time. This is difficult to assess on today's noncontrast exam.  10-28-15: ct of head: Mild atrophy. No evidence of acute infarction, hemorrhage, extra-axial collection, ventriculomegaly, or mass effect.  10-31-15: swallow study: Regular solids;Thin liquid  11-18-15: chest x-ray: no acute cardiopulmonary disease process   11-22-15: chest x-ray: Bibasilar pneumonia.     LABS REVIEWED:   09-14-15: hgb a1c 12.4 09-28-15: vit B12: 1841; CA 19-9: 113; CEA; 12.2;  10-19-15: tsh 1.783 11-03-15: wbc 6.7 hgb 8.9; hct 27.8; mcv 93.9; plt 171; glucose 243; bun 25; creat 1.41; k+ 4.9; na++142  11-18-15: UA: neg; flu swab: neg 11-19-15: wbc 9.5; hgb 8.4; hct 27.0; mcv 92.3; plt 171;  glucose 282; bun 59.6; creat 2.45; k+ 4.4; na++149; liver normal albumin albumin 3.1 11-22-15: wbc 3.9; hgb 9.4; hct 28.7; mcv 92.3; plt 273; glucose 90; bun 50; creat 2.03; k+ 4.3; na++ 143; liver normal albumin 2.6; urine culture: no growth; blood culture: no growth; hgb a1c 11.0 11-24-15: wbc 17.1; hgb 8.6; hct 26.5; mcv 88.9 plt 323; glucose 35; bun 19; creat 0.94; k+ 3.2; na++145 11-25-15: glucose 104; bun 19; creat 1.03; k+ 3.8; na++145; mag 2.0         Review of Systems  Unable to perform ROS: dementia     Physical Exam  Constitutional: No distress.  Thin   Eyes: Conjunctivae are normal.  Neck: Neck supple. No JVD present. No thyromegaly present.  Cardiovascular: Normal rate, regular rhythm and intact distal pulses.   Respiratory: Effort normal and breath sounds normal. No respiratory distress. He has no wheezes.  GI: Soft. Bowel sounds are normal. He exhibits no distension. There is no tenderness.  Musculoskeletal: He exhibits no edema.  Able to move all extremities   Lymphadenopathy:    He has no cervical adenopathy.  Neurological: not aware of surroundings  Skin: Skin is warm and dry. He is not diaphoretic.  Psychiatric: restless      ASSESSMENT/ PLAN:  1. Dyslipidemia: will stop his pravachol at this time.   2. Genella Rife: will continue protonix 40 mg daily  3. COPD: will stop the symbicort and sprivia it is doubtful that he is able to use these appropriately will use duoneb every 6 hours as needed and will monitor   4. Hypothyroidism: will continue synthroid 75 mcg daily   5. Hypertension: will stop the lopressor at this time due to his low readings and will monitor  6. CAD with 3 vessel disease: will stop the asa and plavix at this time will continue to monitor his status.   7.  Diabetes type I: will continue tuojeo  10 units nightly; will continue novolog 4 units with meals with an ssi.   8. Seizure: will continue keppra 100 mg twice daily   9. Gerd: will  continue protonix  40 mg daily   10. Chronic back pain: will continue lyrica 50 mg in the AM and 100 mg in  the PM. Will start roxanol 5 mg every 2 hours as needed for pain or distress so he will not have to swallow pain medication.   11. Pneumonia: will complete augmentin and will monitor   12. 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    Time spent with patient 50   minutes >50% time spent counseling; reviewing medical record; tests; labs; and developing future plan of care   Synthia Innocent NP [facility]  Contact [phone] Monday through Friday 8am- 5pm  After hours call [phone]

## 2015-11-27 NOTE — Telephone Encounter (Signed)
Possible re-admission to facility  - This is a patient you were seeing at **[facility]** - [facility] fu is needed IF patient was re-admitted to facility upon discharge. Hospital discharge **11/26/15**

## 2015-11-29 ENCOUNTER — Telehealth: Payer: Self-pay | Admitting: Neurology

## 2015-11-29 NOTE — Telephone Encounter (Signed)
Pt's daughter-in-law called said he is in skilled nursing facility and dementia is progressing quickly. Pt is going into Hospice/ palliative care and sts there are some end of life preparations they are making now.

## 2015-11-29 NOTE — Telephone Encounter (Signed)
Left Vm, and offered any service that hospice may need form [place], just give [place] a call. CD

## 2015-12-02 ENCOUNTER — Ambulatory Visit: Payer: Medicare Other | Admitting: Internal Medicine

## 2015-12-02 ENCOUNTER — Non-Acute Institutional Stay (SKILLED_NURSING_FACILITY): Payer: Medicare Other | Admitting: Internal Medicine

## 2015-12-02 ENCOUNTER — Encounter: Payer: Self-pay | Admitting: Internal Medicine

## 2015-12-02 DIAGNOSIS — J9601 Acute respiratory failure with hypoxia: Secondary | ICD-10-CM | POA: Diagnosis not present

## 2015-12-02 DIAGNOSIS — K529 Noninfective gastroenteritis and colitis, unspecified: Secondary | ICD-10-CM | POA: Diagnosis not present

## 2015-12-02 DIAGNOSIS — N179 Acute kidney failure, unspecified: Secondary | ICD-10-CM | POA: Diagnosis not present

## 2015-12-02 DIAGNOSIS — J189 Pneumonia, unspecified organism: Secondary | ICD-10-CM

## 2015-12-02 DIAGNOSIS — I739 Peripheral vascular disease, unspecified: Secondary | ICD-10-CM | POA: Diagnosis not present

## 2015-12-02 DIAGNOSIS — R569 Unspecified convulsions: Secondary | ICD-10-CM

## 2015-12-02 DIAGNOSIS — I10 Essential (primary) hypertension: Secondary | ICD-10-CM | POA: Diagnosis not present

## 2015-12-02 DIAGNOSIS — E162 Hypoglycemia, unspecified: Secondary | ICD-10-CM | POA: Diagnosis not present

## 2015-12-02 DIAGNOSIS — E038 Other specified hypothyroidism: Secondary | ICD-10-CM | POA: Diagnosis not present

## 2015-12-02 DIAGNOSIS — E1065 Type 1 diabetes mellitus with hyperglycemia: Secondary | ICD-10-CM

## 2015-12-02 DIAGNOSIS — Z515 Encounter for palliative care: Secondary | ICD-10-CM

## 2015-12-02 DIAGNOSIS — N2889 Other specified disorders of kidney and ureter: Secondary | ICD-10-CM

## 2015-12-02 DIAGNOSIS — G9340 Encephalopathy, unspecified: Secondary | ICD-10-CM | POA: Diagnosis not present

## 2015-12-02 DIAGNOSIS — D513 Other dietary vitamin B12 deficiency anemia: Secondary | ICD-10-CM

## 2015-12-02 DIAGNOSIS — N189 Chronic kidney disease, unspecified: Secondary | ICD-10-CM

## 2015-12-02 DIAGNOSIS — E034 Atrophy of thyroid (acquired): Secondary | ICD-10-CM

## 2015-12-02 DIAGNOSIS — E1049 Type 1 diabetes mellitus with other diabetic neurological complication: Secondary | ICD-10-CM

## 2015-12-02 DIAGNOSIS — J449 Chronic obstructive pulmonary disease, unspecified: Secondary | ICD-10-CM | POA: Diagnosis not present

## 2015-12-02 DIAGNOSIS — IMO0002 Reserved for concepts with insufficient information to code with codable children: Secondary | ICD-10-CM

## 2015-12-02 NOTE — Progress Notes (Deleted)
MRN: [id number] Name: Benjamin Hodge  Sex: male Age: 75 y.o. DOB: 08-Oct-1940  PSC #:  Facility/Room: Level Of Care: SNF Provider: Merrilee Seashore D Emergency Contacts: Extended Emergency Contact Information Primary Emergency Contact: Vazquez,[name]  [place] Home Phone: [phone] Work Phone: [phone] Relation: Son Secondary Emergency Contact: Scholten,Christina  [place] Home Phone: [phone] Relation: Other  Code Status:   Allergies: Ace inhibitors; Cymbalta; Lipitor; Bee venom; Glucerna; and Angiotensin receptor blockers  No chief complaint on file.   HPI: Patient is 75 y.o. male who  Past Medical History  Diagnosis Date  . CAD (coronary artery disease)     LAD 40-50% stenosis, first diagonal small 90% stenosis, circumflex 70% stenosis, OM 80% stenosis, right coronary artery 80-90% stenosis.  . Right upper lobe pneumonia 11/28/2008  . Anxiety 07/18/2008  . Irritable bowel syndrome 04/27/2008  . COPD (chronic obstructive pulmonary disease) (HCC) 02/27/2008  . B12 deficiency 02/16/2008    in 5/09: B12 262, MMA 1040  . Chronic diarrhea 01/01/2008    s/p EGD 10/06: H pylori + gastritis, duodenal biopsy normal (Dr. Elnoria Howard); s/p colonoscopy 10/06: hemorrhoids (Dr. Elnoria Howard); evaluated by Dr. Yancey Flemings  in 8/09: tissue transglutaminase AB negative, VIP normal, stool fat content normal, urine 5-HIAA normal; normal GES 11/09 (done for "fluctuating sugars")  . 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 Orthostatic hypotension 02/09/2007  . Hypertension 02/09/2007  . Spinal stenosis in cervical region 05/03    MRI  . Erectile dysfunction 09/27/2006    s/p penile implant  . Anemia, iron deficiency 09/27/2006    neg colonoscopy 2006 - Dr. Elnoria Howard; ferritin 152; hgb  15.7  on 07/07  . Hypothyroidism 09/27/2006    TSH 2.639  07/07  . Hypersomnia 09/27/2006    evaluated by Dr. Jetty Duhamel (5/08) and Dr. Vickey Huger; PSG  8/09: chronic delayed sleep phase syndrome (patient sleeps during the day and is awake at night), nocturnal myoclonus (eval for RLS and IDA suggested). Pt advised to change sleeping behavior  . Diabetes mellitus type II 09/07/1984    poorly controlled, complicated by peripheral neuropathy, microalbuminuria, mild non proliferative retinopathy, s/p DKA 7/05, on insulin pump x 4/09, started a pump vacation on 11/19/2008  . Hyperlipidemia   . 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 Hemorrhoids   . Chronic kidney disease   . Unspecified hereditary and idiopathic peripheral neuropathy   . Other chronic pain   . Other malaise and fatigue   . Depression   . Hypertrophy of prostate without urinary obstruction and other lower urinary tract symptoms (LUTS)   . Lumbago   . Heart attack (HCC) 03/2014    mild  . Renal mass, right: Incentendal finding MRI 10/02/2015 10/03/2015  . Hypogonadism in male 10/03/2015  . Asthma   . Acute respiratory failure with hypoxia (HCC)   . Type 1 diabetes mellitus with neurological manifestations, uncontrolled (HCC)   . Essential hypertension   . COPD (chronic obstructive pulmonary disease) (HCC)   . CAD (coronary artery disease)     moderate 3V disease at cath 7/15- medical Rx  . Anemia, chronic disease   . Seizures (HCC)   . B12 deficiency   . Dementia without behavioral disturbance   . Acute encephalopathy   . Acute kidney injury superimposed on chronic kidney disease (HCC)   . Sepsis (HCC)   . HCAP (healthcare-associated pneumonia)     Past Surgical History  Procedure Laterality Date  . Penile prosthesis implant    .  Tonsillectomy    . Colonoscopy  06/25/2005    Dr. Jeani Hawking  . Left heart catheterization with coronary angiogram [name] 03/13/2014    Procedure: LEFT HEART CATHETERIZATION WITH CORONARY ANGIOGRAM;  Surgeon: Peter M Swaziland, MD;  Location[facility] CATH LAB;  Service: Cardiovascular;  Laterality: [name];  . Peripheral vascular catheterization [name] 02/19/2015     Procedure: Abdominal Aortogram;  Surgeon: Nada Libman, MD;  Location: [facility] INVASIVE CV LAB;  Service: Cardiovascular;  Laterality: [name];  . Peripheral vascular catheterization Left 02/19/2015    Procedure: Lower Extremity Angiography;  Surgeon: Nada Libman, MD;  Location: [facility] INVASIVE CV LAB;  Service: Cardiovascular;  Laterality: Left;      Medication List       This list is accurate as of: 12/02/15 12:05 PM.  Always use your most recent med list.               amoxicillin-clavulanate 875-125 MG tablet  Commonly known as:  AUGMENTIN  Take 1 tablet by mouth 2 (two) times daily.     diphenoxylate-atropine 2.5-0.025 MG tablet  Commonly known as:  LOMOTIL  Take 1 tablet by mouth every 6 (six) hours as needed for diarrhea or loose stools.     escitalopram 20 MG tablet  Commonly known as:  LEXAPRO  TAKE ONE TABLET BY MOUTH ONE TIME DAILY     insulin aspart 100 UNIT/ML injection  Commonly known as:  novoLOG  CBG 151-200= 2 units; 201- 250 = 3 units; 251-300 = 5 units; 301- 350 =7 units; 351- 400= 9 units; 401- 450 = 11 units. CBG > 450, Call MD.     insulin aspart 100 UNIT/ML injection  Commonly known as:  novoLOG  Inject 4 Units into the skin 3 (three) times daily with meals.     Insulin Glargine 300 UNIT/ML Sopn  Commonly known as:  TOUJEO SOLOSTAR  Inject 10 Units into the skin at bedtime.     levETIRAcetam 1000 MG tablet  Commonly known as:  KEPPRA  Take 1 tablet (1,000 mg total) by mouth 2 (two) times daily. For seizures     levothyroxine 75 MCG tablet  Commonly known as:  SYNTHROID, LEVOTHROID  Take 1 tablet (75 mcg total) by mouth daily.     morphine 20 MG/ML concentrated solution  Commonly known as:  ROXANOL  Take 5 mg by mouth every 2 (two) hours as needed for severe pain.     multivitamin with minerals Tabs tablet  Take 1 tablet by mouth daily.     pantoprazole 40 MG tablet  Commonly known as:  PROTONIX  TAKE ONE TABLET BY MOUTH ONE TIME DAILY      pregabalin 100 MG capsule  Commonly known as:  LYRICA  Take 100 mg by mouth at bedtime.     pregabalin 50 MG capsule  Commonly known as:  LYRICA  Take 50 mg by mouth daily.     saccharomyces boulardii 250 MG capsule  Commonly known as:  FLORASTOR  Take 1 capsule (250 mg total) by mouth every evening.     vitamin B-12 1000 MCG tablet  Commonly known as:  CYANOCOBALAMIN  Take 1,000 mcg by mouth daily.        No orders of the defined types were placed in this encounter.    Immunization History  Administered Date(s) Administered  . Influenza Split 07/27/2011  . Influenza,inj,Quad PF,36+ Mos 07/20/2013, 06/06/2014, 07/02/2015  . Influenza-Unspecified 06/06/2009, 07/29/2010  . Pneumococcal Conjugate-13 07/02/2015  .  Pneumococcal Polysaccharide-23 07/21/2012  . Td 09/14/2006    Social History  Substance Use Topics  . Smoking status: Former Smoker    Types: Cigarettes    Quit date: 09/28/2004  . Smokeless tobacco: Never Used  . Alcohol Use: 0.0 oz/week    0 Standard drinks or equivalent per week     Comment: occ/social (moderate)    Family history is    Review of Systems  DATA OBTAINED: from patient, nurse, medical record, family member GENERAL:  no fevers, fatigue, appetite changes SKIN: No itching, rash or wounds EYES: No eye pain, redness, discharge EARS: No earache, tinnitus, change in hearing NOSE: No congestion, drainage or bleeding  MOUTH/THROAT: No mouth or tooth pain, No sore throat RESPIRATORY: No cough, wheezing, SOB CARDIAC: No chest pain, palpitations, lower extremity edema  GI: No abdominal pain, No N/V/D or constipation, No heartburn or reflux  GU: No dysuria, frequency or urgency, or incontinence  MUSCULOSKELETAL: No unrelieved bone/joint pain NEUROLOGIC: No headache, dizziness or focal weakness PSYCHIATRIC: No c/o anxiety or sadness   There were no vitals filed for this visit.  SpO2 Readings from Last 1 Encounters:  11/27/15 99%         Physical Exam  GENERAL APPEARANCE: Alert, conversant,  No acute distress.  SKIN: No diaphoresis rash HEAD: Normocephalic, atraumatic  EYES: Conjunctiva/lids clear. Pupils round, reactive. EOMs intact.  EARS: External exam WNL, canals clear. Hearing grossly normal.  NOSE: No deformity or discharge.  MOUTH/THROAT: Lips w/o lesions  RESPIRATORY: Breathing is even, unlabored. Lung sounds are clear   CARDIOVASCULAR: Heart RRR no murmurs, rubs or gallops. No peripheral edema.   GASTROINTESTINAL: Abdomen is soft, non-tender, not distended w/ normal bowel sounds. GENITOURINARY: Bladder non tender, not distended  MUSCULOSKELETAL: No abnormal joints or musculature NEUROLOGIC:  Cranial nerves 2-12 grossly intact. Moves all extremities  PSYCHIATRIC: Mood and affect appropriate to situation, no behavioral issues  Patient Active Problem List   Diagnosis Date Noted  . Failure to thrive in adult 11/27/2015  . Vascular dementia without behavioral disturbance 11/27/2015  . Acute respiratory failure with hypoxia (HCC) 11/22/2015  . Acute kidney injury superimposed on chronic kidney disease (HCC) 11/22/2015  . Sepsis (HCC) 11/22/2015  . HCAP (healthcare-associated pneumonia) 11/22/2015  . DNR (do not resuscitate) 11/22/2015  . Palliative care encounter   . Sick 11/18/2015  . Depression 11/07/2015  . Hypotension 10/29/2015  . Slurred speech   . Hyperglycemia 10/18/2015  . Hyperglycemic crisis in diabetes mellitus (HCC) 10/18/2015  . Aspiration pneumonia (HCC) 10/18/2015  . Acute encephalopathy   . Acute hyperglycemia   . Diarrhea 10/16/2015  . Renal mass, right: Incentendal finding MRI 10/02/2015 10/03/2015  . Hypogonadism in male 10/03/2015  . Dehydration   . Hyperglycemia due to type 1 diabetes mellitus (HCC) 10/02/2015  . Encephalopathy, metabolic   . Heme positive stool 10/01/2015  . Elevated CEA 09/30/2015  . Elevated CA 19-9 level 09/30/2015  . CKD (chronic kidney disease),  stage II 09/30/2015  . COPD with exacerbation (HCC) 09/27/2015  . Narcotic dependency, continuous (HCC) 09/27/2015  . AKI (acute kidney injury) (HCC) 09/27/2015  . DKA (diabetic ketoacidosis) (HCC) 09/14/2015  . Narcotic abuse   . Hyperglycemia without ketosis 09/13/2015  . DKA (diabetic ketoacidoses) (HCC) 09/06/2015  . Partial symptomatic epilepsy with complex partial seizures, not intractable, without status epilepticus (HCC) 07/16/2015  . Dementia without behavioral disturbance 07/16/2015  . Hypersomnia with long sleep time, idiopathic 07/16/2015  . Pruritus of scalp 05/04/2015  . Loose  stools 05/04/2015  . Absence attack (HCC) 05/04/2015  . B12 deficiency 05/04/2015  . Seizures (HCC) 04/10/2015  . Cerebrovascular disease 04/10/2015  . PVD (peripheral vascular disease) (HCC) 02/18/2015  . Testosterone deficiency 01/02/2015  . 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 Right-sided chest wall pain 11/27/2014  . Ecchymosis 08/29/2014  . Anemia- chronic 04/03/2014  . CAD- moderate3V disease at cath 7/15- medical Rx 03/28/2014  . Dyslipidemia 03/13/2014  . Non-STEMI (non-ST elevated myocardial infarction) (HCC) 03/10/2014  . 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 Low back pain potentially associated with spinal stenosis 02/26/2014  . ADHD, adult residual type 05/31/2013  . Elevated AST (SGOT) 01/23/2013  . SHOULDER PAIN, LEFT 03/04/2009  . ANXIETY 07/18/2008  . IRRITABLE BOWEL SYNDROME 04/27/2008  . NOCTURIA 03/13/2008  . COPD (chronic obstructive pulmonary disease) (HCC) 02/27/2008  . Mild nonproliferative diabetic retinopathy (HCC) 11/18/2007  . LEG PAIN, RIGHT 09/29/2007  . POLYNEUROPATHY IN DIABETES 02/09/2007  . HYPOTENSION, ORTHOSTATIC 02/09/2007  . Hypothyroidism 09/27/2006  . Type 1 diabetes mellitus with neurological manifestations, uncontrolled (HCC) 09/27/2006  . ERECTILE DYSFUNCTION  09/27/2006  . Essential hypertension 09/27/2006  . SPINAL STENOSIS, CERVICAL 09/27/2006  . Hypersomnia 09/27/2006    CBC    Component Value Date/Time   WBC 17.1* 11/24/2015 0442   WBC 5.0 06/28/2015 1621   WBC 5.3 11/29/2013 1507   RBC 2.98* 11/24/2015 0442   RBC 3.51* 06/28/2015 1621   RBC 3.37* 11/29/2013 1507   RBC 3.02* 11/26/2008 0727   HGB 8.6* 11/24/2015 0442   HGB 11.2* 11/29/2013 1507   HCT 26.5* 11/24/2015 0442   HCT 33.8* 06/28/2015 1621   HCT 34.4* 11/29/2013 1507   PLT 323 11/24/2015 0442   PLT 231 06/28/2015 1621   PLT 215 11/29/2013 1507   MCV 88.9 11/24/2015 0442   MCV 96 06/28/2015 1621   MCV 102.1* 11/29/2013 1507   LYMPHSABS 0.5* 11/22/2015 0441   LYMPHSABS 1.3 06/28/2015 1621   LYMPHSABS 0.9 11/29/2013 1507   MONOABS 0.2 11/22/2015 0441   MONOABS 0.5 11/29/2013 1507   EOSABS 0.0 11/22/2015 0441   EOSABS 0.2 06/28/2015 1621   EOSABS 0.3 11/29/2013 1507   BASOSABS 0.0 11/22/2015 0441   BASOSABS 0.0 06/28/2015 1621   BASOSABS 0.0 11/29/2013 1507    CMP     Component Value Date/Time   NA 145 11/25/2015 0250   NA 138 06/28/2015 1621   NA 143 11/29/2013 1507   K 3.8 11/25/2015 0250   K 5.0 11/29/2013 1507   CL 103 11/25/2015 0250   CL 108* 11/07/2012 1507   CO2 29 11/25/2015 0250   CO2 26 11/29/2013 1507   GLUCOSE 104* 11/25/2015 0250   GLUCOSE 232* 06/28/2015 1621   GLUCOSE 156* 11/29/2013 1507   GLUCOSE 256* 11/07/2012 1507   BUN 19 11/25/2015 0250   BUN 31* 06/28/2015 1621   BUN 20.9 11/29/2013 1507   CREATININE 1.03 11/25/2015 0250   CREATININE 1.1 11/29/2013 1507   CALCIUM 8.9 11/25/2015 0250   CALCIUM 9.3 11/29/2013 1507   PROT 6.0* 11/23/2015 0309   PROT 6.6 06/28/2015 1621   PROT 6.6 11/29/2013 1507   ALBUMIN 2.2* 11/23/2015 0309   ALBUMIN 4.1 06/28/2015 1621   ALBUMIN 3.9 11/29/2013 1507   AST 39 11/23/2015 0309   AST 22 11/29/2013 1507   ALT 31 11/23/2015 0309   ALT 20 11/29/2013 1507   ALKPHOS 78 11/23/2015 0309    ALKPHOS  65 11/29/2013 1507   BILITOT 0.4 11/23/2015 0309   BILITOT 0.3 06/28/2015 1621   BILITOT 0.46 11/29/2013 1507   GFRNONAA >60 11/25/2015 0250   GFRAA >60 11/25/2015 0250    Lab Results  Component Value Date   HGBA1C 11.0* 11/22/2015     Dg Chest Portable 1 View  11/22/2015  CLINICAL DATA:  Respiratory distress EXAM: PORTABLE CHEST 1 VIEW COMPARISON:  10/28/2015 FINDINGS: There is chronic elevation of the left diaphragm. Patchy lower lung opacity above baseline that is primarily concerning for pneumonia, especially medially on the right. Normal heart size and aortic contours when allowing for technique. IMPRESSION: Bibasilar pneumonia. Electronically Signed   By: Marnee Spring M.D.   On: 11/22/2015 04:39    Not all labs, radiology exams or other studies done during hospitalization come through on my EPIC note; however they are reviewed by me.    Assessment and Plan  No problem-specific assessment & plan notes found for this encounter.   Margit Hanks, MD

## 2015-12-02 NOTE — Progress Notes (Signed)
Patient ID: Benjamin Hodge, male   DOB: 1941/04/25, 75 y.o.   MRN: [id number] MRN: [id number] Name: Benjamin Hodge  Sex: male Age: 75 y.o. DOB: 1941/02/14  PSC #: Ronni Rumble Facility/Room: 101 A Level Of Care: SNF Provider: Merrilee Seashore Emergency Contacts: Extended Emergency Contact Information Primary Emergency Contact: Broxterman,[name]  [place] Home Phone: [phone] Work Phone: [phone] Relation: Son Secondary Emergency Contact: Daniello,Christina  [place] Home Phone: [phone] Relation: Other  Code Status:   Allergies: Ace inhibitors; Cymbalta; Lipitor; Bee venom; Glucerna; and Angiotensin receptor blockers  Chief Complaint  Patient presents with  . Readmit To SNF    HPI: Patient is 75 y.o. male whopast medical history that includes recent pneumonia, uncontrolled diabetes, COPD, chronic kidney disease, CAD, seizure disorder, hypertension since to the emergency department from facility with the chief complaint of respiratory distress and hypoglycemia. Initial evaluation reveals acute respiratory failure with hypoxia. Pt was admitted to [facility] from 3/17-21 where he was treated for acute respiratory failure and sepsis 2/2 HCAP. Hospital course was further complicated by hypoglycemia in a brittle diabetic, ARF and encephalopathy. Pt was seen by palliative and it was determined pt would be comfort care once back at SNF. While at SNF pt will be followed for seizures, tx with keppra, htn, tx with metoprolol and hypothyroidism, tx with synthroid.  Past Medical History  Diagnosis Date  . CAD (coronary artery disease)     LAD 40-50% stenosis, first diagonal small 90% stenosis, circumflex 70% stenosis, OM 80% stenosis, right coronary artery 80-90% stenosis.  . Right upper lobe pneumonia 11/28/2008  . Anxiety 07/18/2008  . Irritable bowel syndrome 04/27/2008  . COPD (chronic obstructive pulmonary disease) (HCC) 02/27/2008  . B12 deficiency  02/16/2008    in 5/09: B12 262, MMA 1040  . Chronic diarrhea 01/01/2008    s/p EGD 10/06: H pylori + gastritis, duodenal biopsy normal (Dr. Elnoria Howard); s/p colonoscopy 10/06: hemorrhoids (Dr. Elnoria Howard); evaluated by Dr. Yancey Flemings  in 8/09: tissue transglutaminase AB negative, VIP normal, stool fat content normal, urine 5-HIAA normal; normal GES 11/09 (done for "fluctuating sugars")  . 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 Orthostatic hypotension 02/09/2007  . Hypertension 02/09/2007  . Spinal stenosis in cervical region 05/03    MRI  . Erectile dysfunction 09/27/2006    s/p penile implant  . Anemia, iron deficiency 09/27/2006    neg colonoscopy 2006 - Dr. Elnoria Howard; ferritin 152; hgb  15.7  on 07/07  . Hypothyroidism 09/27/2006    TSH 2.639  07/07  . Hypersomnia 09/27/2006    evaluated by Dr. Jetty Duhamel (5/08) and Dr. Vickey Huger; PSG 8/09: chronic delayed sleep phase syndrome (patient sleeps during the day and is awake at night), nocturnal myoclonus (eval for RLS and IDA suggested). Pt advised to change sleeping behavior  . Diabetes mellitus type II 09/07/1984    poorly controlled, complicated by peripheral neuropathy, microalbuminuria, mild non proliferative retinopathy, s/p DKA 7/05, on insulin pump x 4/09, started a pump vacation on 11/19/2008  . Hyperlipidemia   . 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 Hemorrhoids   . Chronic kidney disease   . Unspecified hereditary and idiopathic peripheral neuropathy   . Other chronic pain   . Other malaise and fatigue   . Depression   . Hypertrophy of prostate without urinary obstruction and other lower urinary tract symptoms (LUTS)   . Lumbago   . Heart attack (HCC) 03/2014    mild  . Renal mass, right: Incentendal finding  MRI 10/02/2015 10/03/2015  . Hypogonadism in male 10/03/2015  . Asthma   . Acute respiratory failure with hypoxia (HCC)   . Type 1 diabetes mellitus with neurological manifestations, uncontrolled (HCC)   . Essential  hypertension   . COPD (chronic obstructive pulmonary disease) (HCC)   . CAD (coronary artery disease)     moderate 3V disease at cath 7/15- medical Rx  . Anemia, chronic disease   . Seizures (HCC)   . B12 deficiency   . Dementia without behavioral disturbance   . Acute encephalopathy   . Acute kidney injury superimposed on chronic kidney disease (HCC)   . Sepsis (HCC)   . HCAP (healthcare-associated pneumonia)   . Diabetes mellitus type 1 with peripheral artery disease (HCC) 01/02/2015    Past Surgical History  Procedure Laterality Date  . Penile prosthesis implant    . Tonsillectomy    . Colonoscopy  06/25/2005    Dr. Jeani Hawking  . Left heart catheterization with coronary angiogram [name] 03/13/2014    Procedure: LEFT HEART CATHETERIZATION WITH CORONARY ANGIOGRAM;  Surgeon: Peter M Swaziland, MD;  Location: [facility] CATH LAB;  Service: Cardiovascular;  Laterality: [name];  . Peripheral vascular catheterization [name] 02/19/2015    Procedure: Abdominal Aortogram;  Surgeon: Nada Libman, MD;  Location: [facility] INVASIVE CV LAB;  Service: Cardiovascular;  Laterality: [name];  . Peripheral vascular catheterization Left 02/19/2015    Procedure: Lower Extremity Angiography;  Surgeon: Nada Libman, MD;  Location: [facility] INVASIVE CV LAB;  Service: Cardiovascular;  Laterality: Left;      Medication List       This list is accurate as of: 12/02/15 11:59 PM.  Always use your most recent med list.               amoxicillin-clavulanate 875-125 MG tablet  Commonly known as:  AUGMENTIN  Take 1 tablet by mouth 2 (two) times daily.     diphenoxylate-atropine 2.5-0.025 MG tablet  Commonly known as:  LOMOTIL  Take 1 tablet by mouth every 6 (six) hours as needed for diarrhea or loose stools.     escitalopram 20 MG tablet  Commonly known as:  LEXAPRO  TAKE ONE TABLET BY MOUTH ONE TIME DAILY     insulin aspart 100 UNIT/ML injection  Commonly known as:  novoLOG  CBG 151-200= 2 units; 201- 250 = 3 units; 251-300 =  5 units; 301- 350 =7 units; 351- 400= 9 units; 401- 450 = 11 units. CBG > 450, Call MD.     insulin aspart 100 UNIT/ML injection  Commonly known as:  novoLOG  Inject 4 Units into the skin 3 (three) times daily with meals.     Insulin Glargine 300 UNIT/ML Sopn  Commonly known as:  TOUJEO SOLOSTAR  Inject 10 Units into the skin at bedtime.     levETIRAcetam 1000 MG tablet  Commonly known as:  KEPPRA  Take 1 tablet (1,000 mg total) by mouth 2 (two) times daily. For seizures     levothyroxine 75 MCG tablet  Commonly known as:  SYNTHROID, LEVOTHROID  Take 1 tablet (75 mcg total) by mouth daily.     morphine 20 MG/ML concentrated solution  Commonly known as:  ROXANOL  Take 5 mg by mouth every 2 (two) hours as needed for severe pain.     multivitamin with minerals Tabs tablet  Take 1 tablet by mouth daily.     pantoprazole 40 MG tablet  Commonly known as:  PROTONIX  TAKE ONE TABLET  BY MOUTH ONE TIME DAILY     pregabalin 100 MG capsule  Commonly known as:  LYRICA  Take 100 mg by mouth at bedtime.     pregabalin 50 MG capsule  Commonly known as:  LYRICA  Take 50 mg by mouth daily.     saccharomyces boulardii 250 MG capsule  Commonly known as:  FLORASTOR  Take 1 capsule (250 mg total) by mouth every evening.     vitamin B-12 1000 MCG tablet  Commonly known as:  CYANOCOBALAMIN  Take 1,000 mcg by mouth daily.        No orders of the defined types were placed in this encounter.    Immunization History  Administered Date(s) Administered  . Influenza Split 07/27/2011  . Influenza,inj,Quad PF,36+ Mos 07/20/2013, 06/06/2014, 07/02/2015  . Influenza-Unspecified 06/06/2009, 07/29/2010  . Pneumococcal Conjugate-13 07/02/2015  . Pneumococcal Polysaccharide-23 07/21/2012  . Td 09/14/2006    Social History  Substance Use Topics  . Smoking status: Former Smoker    Types: Cigarettes    Quit date: 09/28/2004  . Smokeless tobacco: Never Used  . Alcohol Use: 0.0 oz/week    0  Standard drinks or equivalent per week     Comment: occ/social (moderate)    Family history is + CA    Review of Systems  DATA OBTAINED: from patient, nurse GENERAL:  no fevers, fatigue, appetite changes SKIN: No itching, rash or wounds EYES: No eye pain, redness, discharge EARS: No earache, tinnitus, change in hearing NOSE: No congestion, drainage or bleeding  MOUTH/THROAT: No mouth or tooth pain, No sore throat RESPIRATORY: No cough, wheezing, SOB CARDIAC: No chest pain, palpitations, lower extremity edema  GI: No abdominal pain, No N/V/D or constipation, No heartburn or reflux  GU: No dysuria, frequency or urgency, or incontinence  MUSCULOSKELETAL: No unrelieved bone/joint pain NEUROLOGIC: No headache, dizziness or focal weakness PSYCHIATRIC: No c/o anxiety or sadness   Filed Vitals:   12/02/15 1244  BP: 135/73  Pulse: 71  Temp: 98.9 F (37.2 C)  Resp: 17    SpO2 Readings from Last 1 Encounters:  12/02/15 94%        Physical Exam  GENERAL APPEARANCE: Alert, conversant,  No acute distress.  SKIN: No diaphoresis rash HEAD: Normocephalic, atraumatic  EYES: Conjunctiva/lids clear. Pupils round, reactive. EOMs intact.  EARS: External exam WNL, canals clear. Hearing grossly normal.  NOSE: No deformity or discharge.  MOUTH/THROAT: Lips w/o lesions  RESPIRATORY: Breathing is even, unlabored. Lung sounds are clear   CARDIOVASCULAR: Heart RRR no murmurs, rubs or gallops. No peripheral edema.   GASTROINTESTINAL: Abdomen is soft, non-tender, not distended w/ normal bowel sounds. GENITOURINARY: Bladder non tender, not distended  MUSCULOSKELETAL: No abnormal joints or musculature NEUROLOGIC:  Cranial nerves 2-12 grossly intact. Moves all extremities  PSYCHIATRIC: Mood and affect appropriate to situation, no behavioral issues  Patient Active Problem List   Diagnosis Date Noted  . Hypoglycemia 12/07/2015  . Failure to thrive in adult 11/27/2015  . Vascular dementia  without behavioral disturbance 11/27/2015  . Acute respiratory failure with hypoxia (HCC) 11/22/2015  . Acute kidney injury superimposed on chronic kidney disease (HCC) 11/22/2015  . Sepsis (HCC) 11/22/2015  . HCAP (healthcare-associated pneumonia) 11/22/2015  . DNR (do not resuscitate) 11/22/2015  . Palliative care encounter   . Sick 11/18/2015  . Depression 11/07/2015  . Hypotension 10/29/2015  . Slurred speech   . Hyperglycemia 10/18/2015  . Hyperglycemic crisis in diabetes mellitus (HCC) 10/18/2015  . Aspiration pneumonia (HCC) 10/18/2015  .  Acute encephalopathy   . Acute hyperglycemia   . Diarrhea 10/16/2015  . Renal mass, right: Incentendal finding MRI 10/02/2015 10/03/2015  . Hypogonadism in male 10/03/2015  . Dehydration   . Hyperglycemia due to type 1 diabetes mellitus (HCC) 10/02/2015  . Encephalopathy, metabolic   . Heme positive stool 10/01/2015  . Elevated CEA 09/30/2015  . Elevated CA 19-9 level 09/30/2015  . CKD (chronic kidney disease), stage II 09/30/2015  . COPD with exacerbation (HCC) 09/27/2015  . Narcotic dependency, continuous (HCC) 09/27/2015  . AKI (acute kidney injury) (HCC) 09/27/2015  . DKA (diabetic ketoacidosis) (HCC) 09/14/2015  . Narcotic abuse   . Hyperglycemia without ketosis 09/13/2015  . DKA (diabetic ketoacidoses) (HCC) 09/06/2015  . Partial symptomatic epilepsy with complex partial seizures, not intractable, without status epilepticus (HCC) 07/16/2015  . Dementia without behavioral disturbance 07/16/2015  . Hypersomnia with long sleep time, idiopathic 07/16/2015  . Pruritus of scalp 05/04/2015  . Loose stools 05/04/2015  . Absence attack (HCC) 05/04/2015  . B12 deficiency 05/04/2015  . Seizures (HCC) 04/10/2015  . Cerebrovascular disease 04/10/2015  . PVD (peripheral vascular disease) (HCC) 02/18/2015  . Testosterone deficiency 01/02/2015  . 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 Right-sided chest  wall pain 11/27/2014  . Ecchymosis 08/29/2014  . Anemia- chronic 04/03/2014  . CAD- moderate3V disease at cath 7/15- medical Rx 03/28/2014  . Dyslipidemia 03/13/2014  . Non-STEMI (non-ST elevated myocardial infarction) (HCC) 03/10/2014  . 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 Low back pain potentially associated with spinal stenosis 02/26/2014  . ADHD, adult residual type 05/31/2013  . Elevated AST (SGOT) 01/23/2013  . SHOULDER PAIN, LEFT 03/04/2009  . Anxiety state 07/18/2008  . IRRITABLE BOWEL SYNDROME 04/27/2008  . NOCTURIA 03/13/2008  . COPD (chronic obstructive pulmonary disease) (HCC) 02/27/2008  . Chronic diarrhea 01/01/2008  . Mild nonproliferative diabetic retinopathy (HCC) 11/18/2007  . LEG PAIN, RIGHT 09/29/2007  . POLYNEUROPATHY IN DIABETES 02/09/2007  . HYPOTENSION, ORTHOSTATIC 02/09/2007  . Hypothyroidism 09/27/2006  . Type 1 diabetes mellitus with neurological manifestations, uncontrolled (HCC) 09/27/2006  . ERECTILE DYSFUNCTION 09/27/2006  . Essential hypertension 09/27/2006  . SPINAL STENOSIS, CERVICAL 09/27/2006  . Hypersomnia 09/27/2006    CBC    Component Value Date/Time   WBC 17.1* 11/24/2015 0442   WBC 5.0 06/28/2015 1621   WBC 5.3 11/29/2013 1507   RBC 2.98* 11/24/2015 0442   RBC 3.51* 06/28/2015 1621   RBC 3.37* 11/29/2013 1507   RBC 3.02* 11/26/2008 0727   HGB 8.6* 11/24/2015 0442   HGB 11.2* 11/29/2013 1507   HCT 26.5* 11/24/2015 0442   HCT 33.8* 06/28/2015 1621   HCT 34.4* 11/29/2013 1507   PLT 323 11/24/2015 0442   PLT 231 06/28/2015 1621   PLT 215 11/29/2013 1507   MCV 88.9 11/24/2015 0442   MCV 96 06/28/2015 1621   MCV 102.1* 11/29/2013 1507   LYMPHSABS 0.5* 11/22/2015 0441   LYMPHSABS 1.3 06/28/2015 1621   LYMPHSABS 0.9 11/29/2013 1507   MONOABS 0.2 11/22/2015 0441   MONOABS 0.5 11/29/2013 1507   EOSABS 0.0 11/22/2015 0441   EOSABS 0.2 06/28/2015 1621   EOSABS 0.3 11/29/2013 1507    BASOSABS 0.0 11/22/2015 0441   BASOSABS 0.0 06/28/2015 1621   BASOSABS 0.0 11/29/2013 1507    CMP     Component Value Date/Time   NA 145 11/25/2015 0250   NA 138 06/28/2015 1621   NA 143 11/29/2013 1507  K 3.8 11/25/2015 0250   K 5.0 11/29/2013 1507   CL 103 11/25/2015 0250   CL 108* 11/07/2012 1507   CO2 29 11/25/2015 0250   CO2 26 11/29/2013 1507   GLUCOSE 104* 11/25/2015 0250   GLUCOSE 232* 06/28/2015 1621   GLUCOSE 156* 11/29/2013 1507   GLUCOSE 256* 11/07/2012 1507   BUN 19 11/25/2015 0250   BUN 31* 06/28/2015 1621   BUN 20.9 11/29/2013 1507   CREATININE 1.03 11/25/2015 0250   CREATININE 1.1 11/29/2013 1507   CALCIUM 8.9 11/25/2015 0250   CALCIUM 9.3 11/29/2013 1507   PROT 6.0* 11/23/2015 0309   PROT 6.6 06/28/2015 1621   PROT 6.6 11/29/2013 1507   ALBUMIN 2.2* 11/23/2015 0309   ALBUMIN 4.1 06/28/2015 1621   ALBUMIN 3.9 11/29/2013 1507   AST 39 11/23/2015 0309   AST 22 11/29/2013 1507   ALT 31 11/23/2015 0309   ALT 20 11/29/2013 1507   ALKPHOS 78 11/23/2015 0309   ALKPHOS 65 11/29/2013 1507   BILITOT 0.4 11/23/2015 0309   BILITOT 0.3 06/28/2015 1621   BILITOT 0.46 11/29/2013 1507   GFRNONAA >60 11/25/2015 0250   GFRAA >60 11/25/2015 0250    Lab Results  Component Value Date   HGBA1C 11.0* 11/22/2015     Dg Chest Portable 1 View  11/22/2015  CLINICAL DATA:  Respiratory distress EXAM: PORTABLE CHEST 1 VIEW COMPARISON:  10/28/2015 FINDINGS: There is chronic elevation of the left diaphragm. Patchy lower lung opacity above baseline that is primarily concerning for pneumonia, especially medially on the right. Normal heart size and aortic contours when allowing for technique. IMPRESSION: Bibasilar pneumonia. Electronically Signed   By: Marnee Spring M.D.   On: 11/22/2015 04:39    Not all labs, radiology exams or other studies done during hospitalization come through on my EPIC note; however they are reviewed by me.    Assessment and Plan  HCAP  (healthcare-associated pneumonia) Chest x-ray is consistent with pneumonia, patient has hypoxic respiratory failure with O2 sats 50%. Started on vancomycin and cefepime. Continue bronchodilators, mucolytics, antitussives and oxygen as needed. SNF -  5 more days of Augmentin.   Hypoglycemia Facility reports CBG 30 at initial evaluation. 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 Home regimen includes 3 units NovoLog 3 times a day with meals, sliding scale and 24 units Toujeo.  Of note last hospitalization patient treated for DKA SNF - Tojeo insulin dose decreased to 10 units instead of 24.  Acute respiratory failure with hypoxia (HCC) Patient oxygen saturation was in the 80s on admission, placed on rebreather mask riefly. This is likely secondary to the healthcare associated pneumonia, this is resolved.  COPD (chronic obstructive pulmonary disease) (HCC) SNF - Not on home oxygen; cont symbicort and spiriva   Acute encephalopathy History of same. Likely related to acute illness.   Acute kidney injury superimposed on chronic kidney disease (HCC) Creatinine 2.03 on admission. It appears his baseline 1.4. Likely related to above. This is back to baseline of 1.34 SNF - will f/u BMP  Anemia- chronic Likely related to chronic disease. Hemoglobin on admission 9.4. Around baseline. Hemoglobin dropped to 7.8 with aggressive IV fluids hydration, likely secondary to hemodilution SNF - d/c Hb was 8.6; will f/u CBC  Hypothyroidism Recent TSH 1.7. SNF-Continue Synthroid 75 mcg daily   Essential hypertension SNF - restart metoprolol;BP is stable  PVD (peripheral vascular disease) (HCC) SNF - cont plavix and ASA  Chronic diarrhea SNF - pt has used imodium prn  for 15+ years  Renal mass, right: Incentendal finding MRI 10/02/2015 SNF - pt would need f/u with urology as desired  Seizures (HCC) SNF - cont keppra 1000 mg BID; no recent seizures  Palliative care encounter Return to  her nursing home, he will be comfort once he returned to the facility. MOST form completed please follow its obstruction. Does not want PEG tube. Does not want BiPAP or intubation, he is DO NOT RESUSCITATE.   Time spent > 45 min;> 50% of time with patient was spent reviewing records, labs, tests and studies, counseling and developing plan of care  Merrilee Seashore, MD

## 2015-12-03 ENCOUNTER — Ambulatory Visit: Payer: Medicare Other | Admitting: Neurology

## 2015-12-05 ENCOUNTER — Encounter: Payer: Self-pay | Admitting: Adult Health

## 2015-12-07 DIAGNOSIS — E162 Hypoglycemia, unspecified: Secondary | ICD-10-CM | POA: Insufficient documentation

## 2015-12-07 NOTE — Assessment & Plan Note (Signed)
Creatinine 2.03 on admission. It appears his baseline 1.4. Likely related to above. This is back to baseline of 1.34 SNF - will f/u BMP

## 2015-12-07 NOTE — Assessment & Plan Note (Signed)
Recent TSH 1.7. SNF-Continue Synthroid 75 mcg daily

## 2015-12-07 NOTE — Assessment & Plan Note (Signed)
Return to her nursing home, he will be comfort once he returned to the facility. MOST form completed please follow its obstruction. Does not want PEG tube. Does not want BiPAP or intubation, he is DO NOT RESUSCITATE.

## 2015-12-07 NOTE — Assessment & Plan Note (Signed)
SNF - cont keppra 1000 mg BID; no recent seizures

## 2015-12-07 NOTE — Assessment & Plan Note (Signed)
SNF - cont plavix and ASA

## 2015-12-07 NOTE — Assessment & Plan Note (Signed)
Chest x-ray is consistent with pneumonia, patient has hypoxic respiratory failure with O2 sats 50%. Started on vancomycin and cefepime. Continue bronchodilators, mucolytics, antitussives and oxygen as needed. SNF -  5 more days of Augmentin.

## 2015-12-07 NOTE — Assessment & Plan Note (Signed)
Likely related to chronic disease. Hemoglobin on admission 9.4. Around baseline. Hemoglobin dropped to 7.8 with aggressive IV fluids hydration, likely secondary to hemodilution SNF - d/c Hb was 8.6; will f/u CBC

## 2015-12-07 NOTE — Assessment & Plan Note (Signed)
SNF - pt would need f/u with urology as desired

## 2015-12-07 NOTE — Assessment & Plan Note (Signed)
Facility reports CBG 30 at initial evaluation. 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 Home regimen includes 3 units NovoLog 3 times a day with meals, sliding scale and 24 units Toujeo.  Of note last hospitalization patient treated for DKA SNF - Tojeo insulin dose decreased to 10 units instead of 24.

## 2015-12-07 NOTE — Assessment & Plan Note (Signed)
SNF - Not on home oxygen; cont symbicort and spiriva

## 2015-12-07 NOTE — Assessment & Plan Note (Signed)
History of same. Likely related to acute illness.

## 2015-12-07 NOTE — Assessment & Plan Note (Signed)
SNF - pt has used imodium prn for 15+ years

## 2015-12-07 NOTE — Assessment & Plan Note (Signed)
SNF - restart metoprolol;BP is stable

## 2015-12-07 NOTE — Assessment & Plan Note (Signed)
Patient oxygen saturation was in the 80s on admission, placed on rebreather mask riefly. This is likely secondary to the healthcare associated pneumonia, this is resolved.

## 2015-12-10 DIAGNOSIS — R262 Difficulty in walking, not elsewhere classified: Secondary | ICD-10-CM | POA: Diagnosis not present

## 2015-12-10 DIAGNOSIS — M6281 Muscle weakness (generalized): Secondary | ICD-10-CM | POA: Diagnosis not present

## 2015-12-10 DIAGNOSIS — E1022 Type 1 diabetes mellitus with diabetic chronic kidney disease: Secondary | ICD-10-CM | POA: Diagnosis not present

## 2015-12-11 DIAGNOSIS — R262 Difficulty in walking, not elsewhere classified: Secondary | ICD-10-CM | POA: Diagnosis not present

## 2015-12-11 DIAGNOSIS — M6281 Muscle weakness (generalized): Secondary | ICD-10-CM | POA: Diagnosis not present

## 2015-12-11 DIAGNOSIS — E1022 Type 1 diabetes mellitus with diabetic chronic kidney disease: Secondary | ICD-10-CM | POA: Diagnosis not present

## 2015-12-12 DIAGNOSIS — R262 Difficulty in walking, not elsewhere classified: Secondary | ICD-10-CM | POA: Diagnosis not present

## 2015-12-12 DIAGNOSIS — M6281 Muscle weakness (generalized): Secondary | ICD-10-CM | POA: Diagnosis not present

## 2015-12-12 DIAGNOSIS — E1022 Type 1 diabetes mellitus with diabetic chronic kidney disease: Secondary | ICD-10-CM | POA: Diagnosis not present

## 2015-12-16 DIAGNOSIS — M6281 Muscle weakness (generalized): Secondary | ICD-10-CM | POA: Diagnosis not present

## 2015-12-16 DIAGNOSIS — R262 Difficulty in walking, not elsewhere classified: Secondary | ICD-10-CM | POA: Diagnosis not present

## 2015-12-16 DIAGNOSIS — E1022 Type 1 diabetes mellitus with diabetic chronic kidney disease: Secondary | ICD-10-CM | POA: Diagnosis not present

## 2015-12-17 DIAGNOSIS — E1022 Type 1 diabetes mellitus with diabetic chronic kidney disease: Secondary | ICD-10-CM | POA: Diagnosis not present

## 2015-12-17 DIAGNOSIS — R262 Difficulty in walking, not elsewhere classified: Secondary | ICD-10-CM | POA: Diagnosis not present

## 2015-12-17 DIAGNOSIS — M6281 Muscle weakness (generalized): Secondary | ICD-10-CM | POA: Diagnosis not present

## 2015-12-18 DIAGNOSIS — R262 Difficulty in walking, not elsewhere classified: Secondary | ICD-10-CM | POA: Diagnosis not present

## 2015-12-18 DIAGNOSIS — M6281 Muscle weakness (generalized): Secondary | ICD-10-CM | POA: Diagnosis not present

## 2015-12-18 DIAGNOSIS — E1022 Type 1 diabetes mellitus with diabetic chronic kidney disease: Secondary | ICD-10-CM | POA: Diagnosis not present

## 2015-12-19 DIAGNOSIS — R262 Difficulty in walking, not elsewhere classified: Secondary | ICD-10-CM | POA: Diagnosis not present

## 2015-12-19 DIAGNOSIS — E1022 Type 1 diabetes mellitus with diabetic chronic kidney disease: Secondary | ICD-10-CM | POA: Diagnosis not present

## 2015-12-19 DIAGNOSIS — M6281 Muscle weakness (generalized): Secondary | ICD-10-CM | POA: Diagnosis not present

## 2015-12-20 DIAGNOSIS — R262 Difficulty in walking, not elsewhere classified: Secondary | ICD-10-CM | POA: Diagnosis not present

## 2015-12-20 DIAGNOSIS — E1022 Type 1 diabetes mellitus with diabetic chronic kidney disease: Secondary | ICD-10-CM | POA: Diagnosis not present

## 2015-12-20 DIAGNOSIS — M6281 Muscle weakness (generalized): Secondary | ICD-10-CM | POA: Diagnosis not present

## 2015-12-23 DIAGNOSIS — R262 Difficulty in walking, not elsewhere classified: Secondary | ICD-10-CM | POA: Diagnosis not present

## 2015-12-23 DIAGNOSIS — M6281 Muscle weakness (generalized): Secondary | ICD-10-CM | POA: Diagnosis not present

## 2015-12-23 DIAGNOSIS — I639 Cerebral infarction, unspecified: Secondary | ICD-10-CM | POA: Diagnosis not present

## 2015-12-23 DIAGNOSIS — E1022 Type 1 diabetes mellitus with diabetic chronic kidney disease: Secondary | ICD-10-CM | POA: Diagnosis not present

## 2015-12-24 DIAGNOSIS — R262 Difficulty in walking, not elsewhere classified: Secondary | ICD-10-CM | POA: Diagnosis not present

## 2015-12-24 DIAGNOSIS — E1022 Type 1 diabetes mellitus with diabetic chronic kidney disease: Secondary | ICD-10-CM | POA: Diagnosis not present

## 2015-12-24 DIAGNOSIS — M6281 Muscle weakness (generalized): Secondary | ICD-10-CM | POA: Diagnosis not present

## 2015-12-25 DIAGNOSIS — R262 Difficulty in walking, not elsewhere classified: Secondary | ICD-10-CM | POA: Diagnosis not present

## 2015-12-25 DIAGNOSIS — M6281 Muscle weakness (generalized): Secondary | ICD-10-CM | POA: Diagnosis not present

## 2015-12-25 DIAGNOSIS — E1022 Type 1 diabetes mellitus with diabetic chronic kidney disease: Secondary | ICD-10-CM | POA: Diagnosis not present

## 2015-12-26 DIAGNOSIS — R262 Difficulty in walking, not elsewhere classified: Secondary | ICD-10-CM | POA: Diagnosis not present

## 2015-12-26 DIAGNOSIS — M6281 Muscle weakness (generalized): Secondary | ICD-10-CM | POA: Diagnosis not present

## 2015-12-26 DIAGNOSIS — E1022 Type 1 diabetes mellitus with diabetic chronic kidney disease: Secondary | ICD-10-CM | POA: Diagnosis not present

## 2015-12-27 DIAGNOSIS — M6281 Muscle weakness (generalized): Secondary | ICD-10-CM | POA: Diagnosis not present

## 2015-12-27 DIAGNOSIS — E1022 Type 1 diabetes mellitus with diabetic chronic kidney disease: Secondary | ICD-10-CM | POA: Diagnosis not present

## 2015-12-27 DIAGNOSIS — R262 Difficulty in walking, not elsewhere classified: Secondary | ICD-10-CM | POA: Diagnosis not present

## 2015-12-28 DIAGNOSIS — R262 Difficulty in walking, not elsewhere classified: Secondary | ICD-10-CM | POA: Diagnosis not present

## 2015-12-28 DIAGNOSIS — M6281 Muscle weakness (generalized): Secondary | ICD-10-CM | POA: Diagnosis not present

## 2015-12-28 DIAGNOSIS — E1022 Type 1 diabetes mellitus with diabetic chronic kidney disease: Secondary | ICD-10-CM | POA: Diagnosis not present

## 2015-12-30 ENCOUNTER — Encounter: Payer: Self-pay | Admitting: Internal Medicine

## 2015-12-30 ENCOUNTER — Non-Acute Institutional Stay (SKILLED_NURSING_FACILITY): Payer: Medicare Other | Admitting: Internal Medicine

## 2015-12-30 DIAGNOSIS — I10 Essential (primary) hypertension: Secondary | ICD-10-CM | POA: Diagnosis not present

## 2015-12-30 DIAGNOSIS — R569 Unspecified convulsions: Secondary | ICD-10-CM

## 2015-12-30 DIAGNOSIS — E038 Other specified hypothyroidism: Secondary | ICD-10-CM | POA: Diagnosis not present

## 2015-12-30 DIAGNOSIS — M6281 Muscle weakness (generalized): Secondary | ICD-10-CM | POA: Diagnosis not present

## 2015-12-30 DIAGNOSIS — E034 Atrophy of thyroid (acquired): Secondary | ICD-10-CM

## 2015-12-30 DIAGNOSIS — E1022 Type 1 diabetes mellitus with diabetic chronic kidney disease: Secondary | ICD-10-CM | POA: Diagnosis not present

## 2015-12-30 DIAGNOSIS — R262 Difficulty in walking, not elsewhere classified: Secondary | ICD-10-CM | POA: Diagnosis not present

## 2015-12-31 DIAGNOSIS — R262 Difficulty in walking, not elsewhere classified: Secondary | ICD-10-CM | POA: Diagnosis not present

## 2015-12-31 DIAGNOSIS — M6281 Muscle weakness (generalized): Secondary | ICD-10-CM | POA: Diagnosis not present

## 2015-12-31 DIAGNOSIS — E1022 Type 1 diabetes mellitus with diabetic chronic kidney disease: Secondary | ICD-10-CM | POA: Diagnosis not present

## 2016-01-01 DIAGNOSIS — R262 Difficulty in walking, not elsewhere classified: Secondary | ICD-10-CM | POA: Diagnosis not present

## 2016-01-01 DIAGNOSIS — E1022 Type 1 diabetes mellitus with diabetic chronic kidney disease: Secondary | ICD-10-CM | POA: Diagnosis not present

## 2016-01-01 DIAGNOSIS — M6281 Muscle weakness (generalized): Secondary | ICD-10-CM | POA: Diagnosis not present

## 2016-01-02 ENCOUNTER — Non-Acute Institutional Stay (SKILLED_NURSING_FACILITY): Payer: Medicare Other | Admitting: Internal Medicine

## 2016-01-02 ENCOUNTER — Encounter: Payer: Self-pay | Admitting: Internal Medicine

## 2016-01-02 ENCOUNTER — Non-Acute Institutional Stay: Payer: Self-pay | Admitting: Internal Medicine

## 2016-01-02 DIAGNOSIS — F329 Major depressive disorder, single episode, unspecified: Secondary | ICD-10-CM | POA: Diagnosis not present

## 2016-01-02 DIAGNOSIS — F32A Depression, unspecified: Secondary | ICD-10-CM

## 2016-01-02 DIAGNOSIS — E1022 Type 1 diabetes mellitus with diabetic chronic kidney disease: Secondary | ICD-10-CM | POA: Diagnosis not present

## 2016-01-02 DIAGNOSIS — F411 Generalized anxiety disorder: Secondary | ICD-10-CM | POA: Diagnosis not present

## 2016-01-02 DIAGNOSIS — Z7189 Other specified counseling: Secondary | ICD-10-CM | POA: Diagnosis not present

## 2016-01-02 DIAGNOSIS — M6281 Muscle weakness (generalized): Secondary | ICD-10-CM | POA: Diagnosis not present

## 2016-01-02 DIAGNOSIS — F0390 Unspecified dementia without behavioral disturbance: Secondary | ICD-10-CM

## 2016-01-02 DIAGNOSIS — R262 Difficulty in walking, not elsewhere classified: Secondary | ICD-10-CM | POA: Diagnosis not present

## 2016-01-02 NOTE — Progress Notes (Deleted)
MRN: [id number] Name: Benjamin Hodge  Sex: male Age: 75 y.o. DOB: 09/24/1940  PSC #: Ronni Rumble Facility/Room:101 Level Of Care: SNF Provider: Merrilee Seashore D Emergency Contacts: Extended Emergency Contact Information Primary Emergency Contact: Yeates,[name]  [place] Home Phone: [phone] Work Phone: [phone] Relation: Son Secondary Emergency Contact: Howley,Christina  [place] Home Phone: [phone] Relation: Other  Code Status:   Allergies: Ace inhibitors; Cymbalta; Lipitor; Bee venom; Glucerna; and Angiotensin receptor blockers  Chief Complaint  Patient presents with  . Acute Visit    HPI: Patient is 75 y.o. male who is being seen for routine issues of seizure, HTN and hypothyroidism.  Past Medical History  Diagnosis Date  . CAD (coronary artery disease)     LAD 40-50% stenosis, first diagonal small 90% stenosis, circumflex 70% stenosis, OM 80% stenosis, right coronary artery 80-90% stenosis.  . Right upper lobe pneumonia 11/28/2008  . Anxiety 07/18/2008  . Irritable bowel syndrome 04/27/2008  . COPD (chronic obstructive pulmonary disease) (HCC) 02/27/2008  . B12 deficiency 02/16/2008    in 5/09: B12 262, MMA 1040  . Chronic diarrhea 01/01/2008    s/p EGD 10/06: H pylori + gastritis, duodenal biopsy normal (Dr. Elnoria Howard); s/p colonoscopy 10/06: hemorrhoids (Dr. Elnoria Howard); evaluated by Dr. Yancey Flemings  in 8/09: tissue transglutaminase AB negative, VIP normal, stool fat content normal, urine 5-HIAA normal; normal GES 11/09 (done for "fluctuating sugars")  . 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 Orthostatic hypotension 02/09/2007  . Hypertension 02/09/2007  . Spinal stenosis in cervical region 05/03    MRI  . Erectile dysfunction 09/27/2006    s/p penile implant  . Anemia, iron deficiency 09/27/2006    neg colonoscopy 2006 - Dr. Elnoria Howard; ferritin 152; hgb  15.7  on 07/07  . Hypothyroidism 09/27/2006     TSH 2.639  07/07  . Hypersomnia 09/27/2006    evaluated by Dr. Jetty Duhamel (5/08) and Dr. Vickey Huger; PSG 8/09: chronic delayed sleep phase syndrome (patient sleeps during the day and is awake at night), nocturnal myoclonus (eval for RLS and IDA suggested). Pt advised to change sleeping behavior  . Diabetes mellitus type II 09/07/1984    poorly controlled, complicated by peripheral neuropathy, microalbuminuria, mild non proliferative retinopathy, s/p DKA 7/05, on insulin pump x 4/09, started a pump vacation on 11/19/2008  . Hyperlipidemia   . 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 Hemorrhoids   . Chronic kidney disease   . Unspecified hereditary and idiopathic peripheral neuropathy   . Other chronic pain   . Other malaise and fatigue   . Depression   . Hypertrophy of prostate without urinary obstruction and other lower urinary tract symptoms (LUTS)   . Lumbago   . Heart attack (HCC) 03/2014    mild  . Renal mass, right: Incentendal finding MRI 10/02/2015 10/03/2015  . Hypogonadism in male 10/03/2015  . Asthma   . Acute respiratory failure with hypoxia (HCC)   . Type 1 diabetes mellitus with neurological manifestations, uncontrolled (HCC)   . Essential hypertension   . COPD (chronic obstructive pulmonary disease) (HCC)   . CAD (coronary artery disease)     moderate 3V disease at cath 7/15- medical Rx  . Anemia, chronic disease   . Seizures (HCC)   . B12 deficiency   . Dementia without behavioral disturbance   . Acute encephalopathy   . Acute kidney injury superimposed on chronic kidney disease (HCC)   . Sepsis (HCC)   . HCAP (healthcare-associated pneumonia)   .  Diabetes mellitus type 1 with peripheral artery disease (HCC) 01/02/2015    Past Surgical History  Procedure Laterality Date  . Penile prosthesis implant    . Tonsillectomy    . Colonoscopy  06/25/2005    Dr. Jeani Hawking  . Left heart catheterization with coronary angiogram [name] 03/13/2014    Procedure: LEFT HEART CATHETERIZATION  WITH CORONARY ANGIOGRAM;  Surgeon: Peter M Swaziland, MD;  Location: [facility] CATH LAB;  Service: Cardiovascular;  Laterality: [name];  . Peripheral vascular catheterization [name] 02/19/2015    Procedure: Abdominal Aortogram;  Surgeon: Nada Libman, MD;  Location: [facility] INVASIVE CV LAB;  Service: Cardiovascular;  Laterality: [name];  . Peripheral vascular catheterization Left 02/19/2015    Procedure: Lower Extremity Angiography;  Surgeon: Nada Libman, MD;  Location: [facility] INVASIVE CV LAB;  Service: Cardiovascular;  Laterality: Left;      Medication List       This list is accurate as of: 01/02/16 11:59 PM.  Always use your most recent med list.               amoxicillin-clavulanate 875-125 MG tablet  Commonly known as:  AUGMENTIN  Take 1 tablet by mouth 2 (two) times daily.     diphenoxylate-atropine 2.5-0.025 MG tablet  Commonly known as:  LOMOTIL  Take 1 tablet by mouth every 6 (six) hours as needed for diarrhea or loose stools.     escitalopram 20 MG tablet  Commonly known as:  LEXAPRO  TAKE ONE TABLET BY MOUTH ONE TIME DAILY     insulin aspart 100 UNIT/ML injection  Commonly known as:  novoLOG  CBG 151-200= 2 units; 201- 250 = 3 units; 251-300 = 5 units; 301- 350 =7 units; 351- 400= 9 units; 401- 450 = 11 units. CBG > 450, Call MD.     insulin aspart 100 UNIT/ML injection  Commonly known as:  novoLOG  Inject 4 Units into the skin 3 (three) times daily with meals.     Insulin Glargine 300 UNIT/ML Sopn  Commonly known as:  TOUJEO SOLOSTAR  Inject 10 Units into the skin at bedtime.     levETIRAcetam 1000 MG tablet  Commonly known as:  KEPPRA  Take 1 tablet (1,000 mg total) by mouth 2 (two) times daily. For seizures     levothyroxine 75 MCG tablet  Commonly known as:  SYNTHROID, LEVOTHROID  Take 1 tablet (75 mcg total) by mouth daily.     morphine 20 MG/ML concentrated solution  Commonly known as:  ROXANOL  Take 5 mg by mouth every 2 (two) hours as needed for severe pain.      multivitamin with minerals Tabs tablet  Take 1 tablet by mouth daily.     pantoprazole 40 MG tablet  Commonly known as:  PROTONIX  TAKE ONE TABLET BY MOUTH ONE TIME DAILY     pregabalin 100 MG capsule  Commonly known as:  LYRICA  Take 100 mg by mouth at bedtime.     pregabalin 50 MG capsule  Commonly known as:  LYRICA  Take 50 mg by mouth daily.     saccharomyces boulardii 250 MG capsule  Commonly known as:  FLORASTOR  Take 1 capsule (250 mg total) by mouth every evening.     vitamin B-12 1000 MCG tablet  Commonly known as:  CYANOCOBALAMIN  Take 1,000 mcg by mouth daily.        No orders of the defined types were placed in this encounter.    Immunization History  Administered Date(s) Administered  . Influenza Split 07/27/2011  . Influenza,inj,Quad PF,36+ Mos 07/20/2013, 06/06/2014, 07/02/2015  . Influenza-Unspecified 06/06/2009, 07/29/2010  . Pneumococcal Conjugate-13 07/02/2015  . Pneumococcal Polysaccharide-23 07/21/2012  . Td 09/14/2006    Social History  Substance Use Topics  . Smoking status: Former Smoker    Types: Cigarettes    Quit date: 09/28/2004  . Smokeless tobacco: Never Used  . Alcohol Use: 0.0 oz/week    0 Standard drinks or equivalent per week     Comment: occ/social (moderate)    Review of Systems  DATA OBTAINED: from patient, nurse GENERAL:  no fevers, fatigue, appetite changes SKIN: No itching, rash HEENT: No complaint RESPIRATORY: No cough, wheezing, SOB CARDIAC: No chest pain, palpitations, lower extremity edema  GI: No abdominal pain, No N/V/D or constipation, No heartburn or reflux  GU: No dysuria, frequency or urgency, or incontinence  MUSCULOSKELETAL: No unrelieved bone/joint pain NEUROLOGIC: No headache, dizziness  PSYCHIATRIC: No overt anxiety or sadness  Filed Vitals:   01/04/16 1845  BP: 142/80  Pulse: 78  Temp: 98.6 F (37 C)  Resp: 19    Physical Exam  GENERAL APPEARANCE: Alert,  No acute distress  SKIN: No  diaphoresis rash, or wounds HEENT: Unremarkable RESPIRATORY: Breathing is even, unlabored. Lung sounds are clear   CARDIOVASCULAR: Heart RRR no murmurs, rubs or gallops. No peripheral edema  GASTROINTESTINAL: Abdomen is soft, non-tender, not distended w/ normal bowel sounds.  GENITOURINARY: Bladder non tender, not distended  MUSCULOSKELETAL: No abnormal joints or musculature NEUROLOGIC: Cranial nerves 2-12 grossly intact. Moves all extremities PSYCHIATRIC: Mood and affect appropriate to situation, no behavioral issues  Patient Active Problem List   Diagnosis Date Noted  . Hypoglycemia 12/07/2015  . Failure to thrive in adult 11/27/2015  . Vascular dementia without behavioral disturbance 11/27/2015  . Acute respiratory failure with hypoxia (HCC) 11/22/2015  . Acute kidney injury superimposed on chronic kidney disease (HCC) 11/22/2015  . Sepsis (HCC) 11/22/2015  . HCAP (healthcare-associated pneumonia) 11/22/2015  . DNR (do not resuscitate) 11/22/2015  . Palliative care encounter   . Sick 11/18/2015  . Depression 11/07/2015  . Hypotension 10/29/2015  . Slurred speech   . Hyperglycemia 10/18/2015  . Hyperglycemic crisis in diabetes mellitus (HCC) 10/18/2015  . Aspiration pneumonia (HCC) 10/18/2015  . Acute encephalopathy   . Acute hyperglycemia   . Diarrhea 10/16/2015  . Renal mass, right: Incentendal finding MRI 10/02/2015 10/03/2015  . Hypogonadism in male 10/03/2015  . Dehydration   . Hyperglycemia due to type 1 diabetes mellitus (HCC) 10/02/2015  . Encephalopathy, metabolic   . Heme positive stool 10/01/2015  . Elevated CEA 09/30/2015  . Elevated CA 19-9 level 09/30/2015  . CKD (chronic kidney disease), stage II 09/30/2015  . COPD with exacerbation (HCC) 09/27/2015  . Narcotic dependency, continuous (HCC) 09/27/2015  . AKI (acute kidney injury) (HCC) 09/27/2015  . DKA (diabetic ketoacidosis) (HCC) 09/14/2015  . Narcotic abuse   . Hyperglycemia without ketosis 09/13/2015   . DKA (diabetic ketoacidoses) (HCC) 09/06/2015  . Partial symptomatic epilepsy with complex partial seizures, not intractable, without status epilepticus (HCC) 07/16/2015  . Dementia without behavioral disturbance 07/16/2015  . Hypersomnia with long sleep time, idiopathic 07/16/2015  . Pruritus of scalp 05/04/2015  . Loose stools 05/04/2015  . Absence attack (HCC) 05/04/2015  . B12 deficiency 05/04/2015  . Seizures (HCC) 04/10/2015  . Cerebrovascular disease 04/10/2015  . PVD (peripheral vascular disease) (HCC) 02/18/2015  . Testosterone deficiency 01/02/2015  . Diabetes mellitus type 1 with  peripheral artery disease (HCC) 01/02/2015  . Right-sided chest wall pain 11/27/2014  . Ecchymosis 08/29/2014  . Anemia- chronic 04/03/2014  . CAD- moderate3V disease at cath 7/15- medical Rx 03/28/2014  . Dyslipidemia 03/13/2014  . Non-STEMI (non-ST elevated myocardial infarction) (HCC) 03/10/2014  . 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 Low back pain potentially associated with spinal stenosis 02/26/2014  . ADHD, adult residual type 05/31/2013  . Elevated AST (SGOT) 01/23/2013  . SHOULDER PAIN, LEFT 03/04/2009  . Anxiety state 07/18/2008  . IRRITABLE BOWEL SYNDROME 04/27/2008  . NOCTURIA 03/13/2008  . COPD (chronic obstructive pulmonary disease) (HCC) 02/27/2008  . Chronic diarrhea 01/01/2008  . Mild nonproliferative diabetic retinopathy (HCC) 11/18/2007  . LEG PAIN, RIGHT 09/29/2007  . POLYNEUROPATHY IN DIABETES 02/09/2007  . HYPOTENSION, ORTHOSTATIC 02/09/2007  . Hypothyroidism 09/27/2006  . Type 1 diabetes mellitus with neurological manifestations, uncontrolled (HCC) 09/27/2006  . ERECTILE DYSFUNCTION 09/27/2006  . Essential hypertension 09/27/2006  . SPINAL STENOSIS, CERVICAL 09/27/2006  . Hypersomnia 09/27/2006    CBC    Component Value Date/Time   WBC 17.1* 11/24/2015 0442   WBC 5.0 06/28/2015 1621   WBC 5.3 11/29/2013 1507   RBC  2.98* 11/24/2015 0442   RBC 3.51* 06/28/2015 1621   RBC 3.37* 11/29/2013 1507   RBC 3.02* 11/26/2008 0727   HGB 8.6* 11/24/2015 0442   HGB 11.2* 11/29/2013 1507   HCT 26.5* 11/24/2015 0442   HCT 33.8* 06/28/2015 1621   HCT 34.4* 11/29/2013 1507   PLT 323 11/24/2015 0442   PLT 231 06/28/2015 1621   PLT 215 11/29/2013 1507   MCV 88.9 11/24/2015 0442   MCV 96 06/28/2015 1621   MCV 102.1* 11/29/2013 1507   LYMPHSABS 0.5* 11/22/2015 0441   LYMPHSABS 1.3 06/28/2015 1621   LYMPHSABS 0.9 11/29/2013 1507   MONOABS 0.2 11/22/2015 0441   MONOABS 0.5 11/29/2013 1507   EOSABS 0.0 11/22/2015 0441   EOSABS 0.2 06/28/2015 1621   EOSABS 0.3 11/29/2013 1507   BASOSABS 0.0 11/22/2015 0441   BASOSABS 0.0 06/28/2015 1621   BASOSABS 0.0 11/29/2013 1507    CMP     Component Value Date/Time   NA 145 11/25/2015 0250   NA 138 06/28/2015 1621   NA 143 11/29/2013 1507   K 3.8 11/25/2015 0250   K 5.0 11/29/2013 1507   CL 103 11/25/2015 0250   CL 108* 11/07/2012 1507   CO2 29 11/25/2015 0250   CO2 26 11/29/2013 1507   GLUCOSE 104* 11/25/2015 0250   GLUCOSE 232* 06/28/2015 1621   GLUCOSE 156* 11/29/2013 1507   GLUCOSE 256* 11/07/2012 1507   BUN 19 11/25/2015 0250   BUN 31* 06/28/2015 1621   BUN 20.9 11/29/2013 1507   CREATININE 1.03 11/25/2015 0250   CREATININE 1.1 11/29/2013 1507   CALCIUM 8.9 11/25/2015 0250   CALCIUM 9.3 11/29/2013 1507   PROT 6.0* 11/23/2015 0309   PROT 6.6 06/28/2015 1621   PROT 6.6 11/29/2013 1507   ALBUMIN 2.2* 11/23/2015 0309   ALBUMIN 4.1 06/28/2015 1621   ALBUMIN 3.9 11/29/2013 1507   AST 39 11/23/2015 0309   AST 22 11/29/2013 1507   ALT 31 11/23/2015 0309   ALT 20 11/29/2013 1507   ALKPHOS 78 11/23/2015 0309   ALKPHOS 65 11/29/2013 1507   BILITOT 0.4 11/23/2015 0309   BILITOT 0.3 06/28/2015 1621   BILITOT 0.46 11/29/2013 1507   GFRNONAA >60 11/25/2015 0250   GFRAA >60 11/25/2015 0250    Assessment and  Plan  Partial symptomatic epilepsy with complex  partial seizures, not intractable, without status epilepticus (HCC) No known seizures since hospitalization;plan - cont keppra 1000 mg BID and lyrica 50 mg q d and 100 mg q Hs  Essential hypertension Metoprolol didn't get restarted so will try again;bp is stable  Hypothyroidism Controlled; cont synthroid 75 mcg daily    Margit Hanks, MD

## 2016-01-03 DIAGNOSIS — R262 Difficulty in walking, not elsewhere classified: Secondary | ICD-10-CM | POA: Diagnosis not present

## 2016-01-03 DIAGNOSIS — E1022 Type 1 diabetes mellitus with diabetic chronic kidney disease: Secondary | ICD-10-CM | POA: Diagnosis not present

## 2016-01-03 DIAGNOSIS — M6281 Muscle weakness (generalized): Secondary | ICD-10-CM | POA: Diagnosis not present

## 2016-01-04 ENCOUNTER — Encounter: Payer: Self-pay | Admitting: Internal Medicine

## 2016-01-04 NOTE — Progress Notes (Signed)
MRN: [id number] Name: Benjamin Hodge  Sex: male Age: 75 y.o. DOB: 02/04/1941  [phone] Work Phone: [phone] Relation: Son Secondary Emergency Contact: Mcwilliams,Christina [place] Home Phone: [phone] Relation: Other  Code Status:   Allergies: Ace inhibitors; Cymbalta; Lipitor; Bee venom; Glucerna; and Angiotensin receptor blockers  Chief Complaint  Patient presents with  .   MEDICAL MANANGMENT OF CHRONIC MEDICAL PROBLEMS  HPI: Patient is 75 y.o. male who is being seen for routine issues of seizure, HTN and hypothyroidism.  Past Medical History  Diagnosis Date  . CAD (coronary artery disease)     LAD 40-50% stenosis, first diagonal small 90% stenosis, circumflex 70% stenosis, OM 80% stenosis, right coronary artery 80-90% stenosis.  . Right upper lobe pneumonia 11/28/2008  . Anxiety 07/18/2008  . Irritable bowel syndrome 04/27/2008  . COPD (chronic obstructive pulmonary disease) (HCC) 02/27/2008  . B12 deficiency 02/16/2008    in 5/09: B12 262, MMA 1040  . Chronic diarrhea 01/01/2008    s/p EGD 10/06: H pylori + gastritis, duodenal biopsy normal (Dr. Elnoria Howard); s/p colonoscopy 10/06: hemorrhoids (Dr. Elnoria Howard); evaluated by Dr. Yancey Flemings in 8/09: tissue transglutaminase AB negative, VIP normal, stool fat content normal, urine 5-HIAA normal; normal GES 11/09 (done for "fluctuating sugars")  . 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 Orthostatic hypotension 02/09/2007  . Hypertension 02/09/2007  . Spinal stenosis in cervical region 05/03    MRI  . Erectile dysfunction 09/27/2006    s/p penile implant  . Anemia, iron deficiency 09/27/2006    neg colonoscopy 2006 - Dr. Elnoria Howard; ferritin 152; hgb 15.7 on 07/07  . Hypothyroidism 09/27/2006    TSH 2.639 07/07  . Hypersomnia 09/27/2006    evaluated by Dr. Jetty Duhamel (5/08) and Dr. Vickey Huger; PSG  8/09: chronic delayed sleep phase syndrome (patient sleeps during the day and is awake at night), nocturnal myoclonus (eval for RLS and IDA suggested). Pt advised to change sleeping behavior  . Diabetes mellitus type II 09/07/1984    poorly controlled, complicated by peripheral neuropathy, microalbuminuria, mild non proliferative retinopathy, s/p DKA 7/05, on insulin pump x 4/09, started a pump vacation on 11/19/2008  . Hyperlipidemia   . 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 Hemorrhoids   . Chronic kidney disease   . Unspecified hereditary and idiopathic peripheral neuropathy   . Other chronic pain   . Other malaise and fatigue   . Depression   . Hypertrophy of prostate without urinary obstruction and other lower urinary tract symptoms (LUTS)   . Lumbago   . Heart attack (HCC) 03/2014    mild  . Renal mass, right: Incentendal finding MRI 10/02/2015 10/03/2015  . Hypogonadism in male 10/03/2015  . Asthma   . Acute respiratory failure with hypoxia (HCC)   . Type 1 diabetes mellitus with neurological manifestations, uncontrolled (HCC)   . Essential hypertension   . COPD (chronic obstructive pulmonary disease) (HCC)   . CAD (coronary artery disease)     moderate 3V disease at cath 7/15- medical Rx  . Anemia, chronic disease   . Seizures (HCC)   . B12 deficiency   . Dementia without behavioral disturbance   . Acute encephalopathy   . Acute kidney injury superimposed on chronic kidney disease (HCC)   . Sepsis (HCC)   . HCAP (healthcare-associated pneumonia)   . Diabetes mellitus type 1 with peripheral artery disease (HCC) 01/02/2015    Past Surgical History  Procedure Laterality Date  . Penile prosthesis implant    . Tonsillectomy    .  Colonoscopy  06/25/2005    Dr. Jeani Hawking  . Left heart catheterization with coronary angiogram [name] 03/13/2014    Procedure: LEFT HEART  CATHETERIZATION WITH CORONARY ANGIOGRAM; Surgeon: Peter M Swaziland, MD; Location: [facility] CATH LAB; Service: Cardiovascular; Laterality: [name];  . Peripheral vascular catheterization [name] 02/19/2015    Procedure: Abdominal Aortogram; Surgeon: Nada Libman, MD; Location: [facility] INVASIVE CV LAB; Service: Cardiovascular; Laterality: [name];  . Peripheral vascular catheterization Left 02/19/2015    Procedure: Lower Extremity Angiography; Surgeon: Nada Libman, MD; Location: [facility] INVASIVE CV LAB; Service: Cardiovascular; Laterality: Left;      Medication List       This list is accurate as of: 01/02/16 11:59 PM. Always use your most recent med list.              amoxicillin-clavulanate 875-125 MG tablet  Commonly known as: AUGMENTIN  Take 1 tablet by mouth 2 (two) times daily.     diphenoxylate-atropine 2.5-0.025 MG tablet  Commonly known as: LOMOTIL  Take 1 tablet by mouth every 6 (six) hours as needed for diarrhea or loose stools.     escitalopram 20 MG tablet  Commonly known as: LEXAPRO  TAKE ONE TABLET BY MOUTH ONE TIME DAILY     insulin aspart 100 UNIT/ML injection  Commonly known as: novoLOG  CBG 151-200= 2 units; 201- 250 = 3 units; 251-300 = 5 units; 301- 350 =7 units; 351- 400= 9 units; 401- 450 = 11 units. CBG > 450, Call MD.     insulin aspart 100 UNIT/ML injection  Commonly known as: novoLOG  Inject 4 Units into the skin 3 (three) times daily with meals.     Insulin Glargine 300 UNIT/ML Sopn  Commonly known as: TOUJEO SOLOSTAR  Inject 10 Units into the skin at bedtime.     levETIRAcetam 1000 MG tablet  Commonly known as: KEPPRA  Take 1 tablet (1,000 mg total) by mouth 2 (two) times daily. For seizures     levothyroxine 75 MCG tablet  Commonly known as: SYNTHROID, LEVOTHROID  Take 1 tablet (75 mcg total) by mouth daily.     morphine 20 MG/ML concentrated solution  Commonly known as:  ROXANOL  Take 5 mg by mouth every 2 (two) hours as needed for severe pain.     multivitamin with minerals Tabs tablet  Take 1 tablet by mouth daily.     pantoprazole 40 MG tablet  Commonly known as: PROTONIX  TAKE ONE TABLET BY MOUTH ONE TIME DAILY     pregabalin 100 MG capsule  Commonly known as: LYRICA  Take 100 mg by mouth at bedtime.     pregabalin 50 MG capsule  Commonly known as: LYRICA  Take 50 mg by mouth daily.     saccharomyces boulardii 250 MG capsule  Commonly known as: FLORASTOR  Take 1 capsule (250 mg total) by mouth every evening.     vitamin B-12 1000 MCG tablet  Commonly known as: CYANOCOBALAMIN  Take 1,000 mcg by mouth daily.        No orders of the defined types were placed in this encounter.   Immunization History  Administered Date(s) Administered  . Influenza Split 07/27/2011  . Influenza,inj,Quad PF,36+ Mos 07/20/2013, 06/06/2014, 07/02/2015  . Influenza-Unspecified 06/06/2009, 07/29/2010  . Pneumococcal Conjugate-13 07/02/2015  . Pneumococcal Polysaccharide-23 07/21/2012  . Td 09/14/2006    Social History  Substance Use Topics  . Smoking status: Former Smoker    Types: Cigarettes    Quit date: 09/28/2004  .  Smokeless tobacco: Never Used  . Alcohol Use: 0.0 oz/week    0 Standard drinks or equivalent per week     Comment: occ/social (moderate)    Review of Systems  DATA OBTAINED: from patient, nurse GENERAL: no fevers, fatigue, appetite changes SKIN: No itching, rash HEENT: No complaint RESPIRATORY: No cough, wheezing, SOB CARDIAC: No chest pain, palpitations, lower extremity edema  GI: No abdominal pain, No N/V/D or constipation, No heartburn or reflux  GU: No dysuria, frequency or urgency, or incontinence  MUSCULOSKELETAL: No unrelieved bone/joint pain NEUROLOGIC: No headache, dizziness  PSYCHIATRIC: No overt anxiety or  sadness  Filed Vitals:   01/04/16 1845  BP: 142/80  Pulse: 78  Temp: 98.6 F (37 C)  Resp: 19    Physical Exam  GENERAL APPEARANCE: Alert, No acute distress  SKIN: No diaphoresis rash, or wounds HEENT: Unremarkable RESPIRATORY: Breathing is even, unlabored. Lung sounds are clear  CARDIOVASCULAR: Heart RRR no murmurs, rubs or gallops. No peripheral edema  GASTROINTESTINAL: Abdomen is soft, non-tender, not distended w/ normal bowel sounds.  GENITOURINARY: Bladder non tender, not distended  MUSCULOSKELETAL: No abnormal joints or musculature NEUROLOGIC: Cranial nerves 2-12 grossly intact. Moves all extremities PSYCHIATRIC: Mood and affect appropriate to situation, no behavioral issues  Patient Active Problem List   Diagnosis Date Noted  . Hypoglycemia 12/07/2015  . Failure to thrive in adult 11/27/2015  . Vascular dementia without behavioral disturbance 11/27/2015  . Acute respiratory failure with hypoxia (HCC) 11/22/2015  . Acute kidney injury superimposed on chronic kidney disease (HCC) 11/22/2015  . Sepsis (HCC) 11/22/2015  . HCAP (healthcare-associated pneumonia) 11/22/2015  . DNR (do not resuscitate) 11/22/2015  . Palliative care encounter   . Sick 11/18/2015  . Depression 11/07/2015  . Hypotension 10/29/2015  . Slurred speech   . Hyperglycemia 10/18/2015  . Hyperglycemic crisis in diabetes mellitus (HCC) 10/18/2015  . Aspiration pneumonia (HCC) 10/18/2015  . Acute encephalopathy   . Acute hyperglycemia   . Diarrhea 10/16/2015  . Renal mass, right: Incentendal finding MRI 10/02/2015 10/03/2015  . Hypogonadism in male 10/03/2015  . Dehydration   . Hyperglycemia due to type 1 diabetes mellitus (HCC) 10/02/2015  . Encephalopathy, metabolic   . Heme positive stool 10/01/2015  . Elevated CEA 09/30/2015  . Elevated CA 19-9 level 09/30/2015  . CKD (chronic kidney  disease), stage II 09/30/2015  . COPD with exacerbation (HCC) 09/27/2015  . Narcotic dependency, continuous (HCC) 09/27/2015  . AKI (acute kidney injury) (HCC) 09/27/2015  . DKA (diabetic ketoacidosis) (HCC) 09/14/2015  . Narcotic abuse   . Hyperglycemia without ketosis 09/13/2015  . DKA (diabetic ketoacidoses) (HCC) 09/06/2015  . Partial symptomatic epilepsy with complex partial seizures, not intractable, without status epilepticus (HCC) 07/16/2015  . Dementia without behavioral disturbance 07/16/2015  . Hypersomnia with long sleep time, idiopathic 07/16/2015  . Pruritus of scalp 05/04/2015  . Loose stools 05/04/2015  . Absence attack (HCC) 05/04/2015  . B12 deficiency 05/04/2015  . Seizures (HCC) 04/10/2015  . Cerebrovascular disease 04/10/2015  . PVD (peripheral vascular disease) (HCC) 02/18/2015  . Testosterone deficiency 01/02/2015  . 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 Right-sided chest wall pain 11/27/2014  . Ecchymosis 08/29/2014  . Anemia- chronic 04/03/2014  . CAD- moderate3V disease at cath 7/15- medical Rx 03/28/2014  . Dyslipidemia 03/13/2014  . Non-STEMI (non-ST elevated myocardial infarction) (HCC) 03/10/2014  . 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 Low back pain potentially associated with spinal  stenosis 02/26/2014  . ADHD, adult residual type 05/31/2013  . Elevated AST (SGOT) 01/23/2013  . SHOULDER PAIN, LEFT 03/04/2009  . Anxiety state 07/18/2008  . IRRITABLE BOWEL SYNDROME 04/27/2008  . NOCTURIA 03/13/2008  . COPD (chronic obstructive pulmonary disease) (HCC) 02/27/2008  . Chronic diarrhea 01/01/2008  . Mild nonproliferative diabetic retinopathy (HCC) 11/18/2007  . LEG PAIN, RIGHT 09/29/2007  . POLYNEUROPATHY IN DIABETES 02/09/2007  . HYPOTENSION, ORTHOSTATIC  02/09/2007  . Hypothyroidism 09/27/2006  . Type 1 diabetes mellitus with neurological manifestations, uncontrolled (HCC) 09/27/2006  . ERECTILE DYSFUNCTION 09/27/2006  . Essential hypertension 09/27/2006  . SPINAL STENOSIS, CERVICAL 09/27/2006  . Hypersomnia 09/27/2006    CBC  Labs (Brief)       Component Value Date/Time   WBC 17.1* 11/24/2015 0442   WBC 5.0 06/28/2015 1621   WBC 5.3 11/29/2013 1507   RBC 2.98* 11/24/2015 0442   RBC 3.51* 06/28/2015 1621   RBC 3.37* 11/29/2013 1507   RBC 3.02* 11/26/2008 0727   HGB 8.6* 11/24/2015 0442   HGB 11.2* 11/29/2013 1507   HCT 26.5* 11/24/2015 0442   HCT 33.8* 06/28/2015 1621   HCT 34.4* 11/29/2013 1507   PLT 323 11/24/2015 0442   PLT 231 06/28/2015 1621   PLT 215 11/29/2013 1507   MCV 88.9 11/24/2015 0442   MCV 96 06/28/2015 1621   MCV 102.1* 11/29/2013 1507   LYMPHSABS 0.5* 11/22/2015 0441   LYMPHSABS 1.3 06/28/2015 1621   LYMPHSABS 0.9 11/29/2013 1507   MONOABS 0.2 11/22/2015 0441   MONOABS 0.5 11/29/2013 1507   EOSABS 0.0 11/22/2015 0441   EOSABS 0.2 06/28/2015 1621   EOSABS 0.3 11/29/2013 1507   BASOSABS 0.0 11/22/2015 0441   BASOSABS 0.0 06/28/2015 1621   BASOSABS 0.0 11/29/2013 1507      CMP  Labs (Brief)       Component Value Date/Time   NA 145 11/25/2015 0250   NA 138 06/28/2015 1621   NA 143 11/29/2013 1507   K 3.8 11/25/2015 0250   K 5.0 11/29/2013 1507   CL 103 11/25/2015 0250   CL 108* 11/07/2012 1507   CO2 29 11/25/2015 0250   CO2 26 11/29/2013 1507   GLUCOSE 104* 11/25/2015 0250   GLUCOSE 232* 06/28/2015 1621   GLUCOSE 156* 11/29/2013 1507   GLUCOSE 256* 11/07/2012 1507   BUN 19 11/25/2015 0250   BUN 31* 06/28/2015 1621   BUN 20.9 11/29/2013 1507   CREATININE 1.03 11/25/2015 0250    CREATININE 1.1 11/29/2013 1507   CALCIUM 8.9 11/25/2015 0250   CALCIUM 9.3 11/29/2013 1507   PROT 6.0* 11/23/2015 0309   PROT 6.6 06/28/2015 1621   PROT 6.6 11/29/2013 1507   ALBUMIN 2.2* 11/23/2015 0309   ALBUMIN 4.1 06/28/2015 1621   ALBUMIN 3.9 11/29/2013 1507   AST 39 11/23/2015 0309   AST 22 11/29/2013 1507   ALT 31 11/23/2015 0309   ALT 20 11/29/2013 1507   ALKPHOS 78 11/23/2015 0309   ALKPHOS 65 11/29/2013 1507   BILITOT 0.4 11/23/2015 0309   BILITOT 0.3 06/28/2015 1621   BILITOT 0.46 11/29/2013 1507   GFRNONAA >60 11/25/2015 0250   GFRAA >60 11/25/2015 0250      Assessment and Plan  Partial symptomatic epilepsy with complex partial seizures, not intractable, without status epilepticus (HCC) No known seizures since hospitalization;plan - cont keppra 1000 mg BID and lyrica 50 mg q d and 100 mg q Hs  Essential hypertension Metoprolol didn't get restarted so will try again;bp is stable  Hypothyroidism Controlled; cont synthroid 75 mcg daily    Margit Hanks, MD

## 2016-01-04 NOTE — Assessment & Plan Note (Signed)
No known seizures since hospitalization;plan - cont keppra 1000 mg BID and lyrica 50 mg q d and 100 mg q Hs

## 2016-01-04 NOTE — Assessment & Plan Note (Signed)
Metoprolol didn't get restarted so will try again;bp is stable

## 2016-01-04 NOTE — Addendum Note (Signed)
Addended by: Merrilee Seashore D on: 01/04/2016 07:33 PM   Modules accepted: Level of Service, SmartSet

## 2016-01-04 NOTE — Progress Notes (Signed)
This encounter was created in error - please disregard.

## 2016-01-04 NOTE — Progress Notes (Signed)
MRN: [id number] Name: Benjamin Hodge  Sex: male Age: 75 y.o. DOB: [birth year]  PSC #:  Facility/Room: Level Of Care: SNF Provider: Merrilee Seashore D Emergency Contacts: Extended Emergency Contact Information Primary Emergency Contact: Kober,[name]  [place] Home Phone: [phone] Work Phone: [phone] Relation: Son Secondary Emergency Contact: Hnat,Christina  [place] Home Phone: [phone] Relation: Other  Code Status:   Allergies: Ace inhibitors; Cymbalta; Lipitor; Bee venom; Glucerna; and Angiotensin receptor blockers  Chief Complaint  Patient presents with  . encounter for family conference    HPI: Patient is 75 y.o. male who   Past Medical History  Diagnosis Date  . CAD (coronary artery disease)     LAD 40-50% stenosis, first diagonal small 90% stenosis, circumflex 70% stenosis, OM 80% stenosis, right coronary artery 80-90% stenosis.  . Right upper lobe pneumonia 11/28/2008  . Anxiety 07/18/2008  . Irritable bowel syndrome 04/27/2008  . COPD (chronic obstructive pulmonary disease) (HCC) 02/27/2008  . B12 deficiency 02/16/2008    in 5/09: B12 262, MMA 1040  . Chronic diarrhea 01/01/2008    s/p EGD 10/06: H pylori + gastritis, duodenal biopsy normal (Dr. Elnoria Howard); s/p colonoscopy 10/06: hemorrhoids (Dr. Elnoria Howard); evaluated by Dr. Yancey Flemings  in 8/09: tissue transglutaminase AB negative, VIP normal, stool fat content normal, urine 5-HIAA normal; normal GES 11/09 (done for "fluctuating sugars")  . 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 Orthostatic hypotension 02/09/2007  . Hypertension 02/09/2007  . Spinal stenosis in cervical region 05/03    MRI  . Erectile dysfunction 09/27/2006    s/p penile implant  . Anemia, iron deficiency 09/27/2006    neg colonoscopy 2006 - Dr. Elnoria Howard; ferritin 152; hgb  15.7  on 07/07  . Hypothyroidism 09/27/2006    TSH 2.639  07/07  . Hypersomnia 09/27/2006    evaluated  by Dr. Jetty Duhamel (5/08) and Dr. Vickey Huger; PSG 8/09: chronic delayed sleep phase syndrome (patient sleeps during the day and is awake at night), nocturnal myoclonus (eval for RLS and IDA suggested). Pt advised to change sleeping behavior  . Diabetes mellitus type II 09/07/1984    poorly controlled, complicated by peripheral neuropathy, microalbuminuria, mild non proliferative retinopathy, s/p DKA 7/05, on insulin pump x 4/09, started a pump vacation on 11/19/2008  . Hyperlipidemia   . 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 Hemorrhoids   . Chronic kidney disease   . Unspecified hereditary and idiopathic peripheral neuropathy   . Other chronic pain   . Other malaise and fatigue   . Depression   . Hypertrophy of prostate without urinary obstruction and other lower urinary tract symptoms (LUTS)   . Lumbago   . Heart attack (HCC) 03/2014    mild  . Renal mass, right: Incentendal finding MRI 10/02/2015 10/03/2015  . Hypogonadism in male 10/03/2015  . Asthma   . Acute respiratory failure with hypoxia (HCC)   . Type 1 diabetes mellitus with neurological manifestations, uncontrolled (HCC)   . Essential hypertension   . COPD (chronic obstructive pulmonary disease) (HCC)   . CAD (coronary artery disease)     moderate 3V disease at cath 7/15- medical Rx  . Anemia, chronic disease   . Seizures (HCC)   . B12 deficiency   . Dementia without behavioral disturbance   . Acute encephalopathy   . Acute kidney injury superimposed on chronic kidney disease (HCC)   . Sepsis (HCC)   . HCAP (healthcare-associated pneumonia)   . Diabetes mellitus type 1 with peripheral artery disease (  HCC) 01/02/2015    Past Surgical History  Procedure Laterality Date  . Penile prosthesis implant    . Tonsillectomy    . Colonoscopy  06/25/2005    Dr. Jeani Hawking  . Left heart catheterization with coronary angiogram [name] 03/13/2014    Procedure: LEFT HEART CATHETERIZATION WITH CORONARY ANGIOGRAM;  Surgeon: Peter M Swaziland, MD;   Location: [facility] CATH LAB;  Service: Cardiovascular;  Laterality: [name];  . Peripheral vascular catheterization [name] 02/19/2015    Procedure: Abdominal Aortogram;  Surgeon: Nada Libman, MD;  Location: [facility] INVASIVE CV LAB;  Service: Cardiovascular;  Laterality: [name];  . Peripheral vascular catheterization Left 02/19/2015    Procedure: Lower Extremity Angiography;  Surgeon: Nada Libman, MD;  Location: [facility] INVASIVE CV LAB;  Service: Cardiovascular;  Laterality: Left;      Medication List       This list is accurate as of: 01/02/16 11:59 PM.  Always use your most recent med list.               amoxicillin-clavulanate 875-125 MG tablet  Commonly known as:  AUGMENTIN  Take 1 tablet by mouth 2 (two) times daily.     diphenoxylate-atropine 2.5-0.025 MG tablet  Commonly known as:  LOMOTIL  Take 1 tablet by mouth every 6 (six) hours as needed for diarrhea or loose stools.     escitalopram 20 MG tablet  Commonly known as:  LEXAPRO  TAKE ONE TABLET BY MOUTH ONE TIME DAILY     insulin aspart 100 UNIT/ML injection  Commonly known as:  novoLOG  CBG 151-200= 2 units; 201- 250 = 3 units; 251-300 = 5 units; 301- 350 =7 units; 351- 400= 9 units; 401- 450 = 11 units. CBG > 450, Call MD.     insulin aspart 100 UNIT/ML injection  Commonly known as:  novoLOG  Inject 4 Units into the skin 3 (three) times daily with meals.     Insulin Glargine 300 UNIT/ML Sopn  Commonly known as:  TOUJEO SOLOSTAR  Inject 10 Units into the skin at bedtime.     levETIRAcetam 1000 MG tablet  Commonly known as:  KEPPRA  Take 1 tablet (1,000 mg total) by mouth 2 (two) times daily. For seizures     levothyroxine 75 MCG tablet  Commonly known as:  SYNTHROID, LEVOTHROID  Take 1 tablet (75 mcg total) by mouth daily.     morphine 20 MG/ML concentrated solution  Commonly known as:  ROXANOL  Take 5 mg by mouth every 2 (two) hours as needed for severe pain.     multivitamin with minerals Tabs tablet  Take 1 tablet by  mouth daily.     pantoprazole 40 MG tablet  Commonly known as:  PROTONIX  TAKE ONE TABLET BY MOUTH ONE TIME DAILY     pregabalin 100 MG capsule  Commonly known as:  LYRICA  Take 100 mg by mouth at bedtime.     pregabalin 50 MG capsule  Commonly known as:  LYRICA  Take 50 mg by mouth daily.     saccharomyces boulardii 250 MG capsule  Commonly known as:  FLORASTOR  Take 1 capsule (250 mg total) by mouth every evening.     vitamin B-12 1000 MCG tablet  Commonly known as:  CYANOCOBALAMIN  Take 1,000 mcg by mouth daily.        No orders of the defined types were placed in this encounter.    Immunization History  Administered Date(s) Administered  . Influenza Split  07/27/2011  . Influenza,inj,Quad PF,36+ Mos 07/20/2013, 06/06/2014, 07/02/2015  . Influenza-Unspecified 06/06/2009, 07/29/2010  . Pneumococcal Conjugate-13 07/02/2015  . Pneumococcal Polysaccharide-23 07/21/2012  . Td 09/14/2006    Social History  Substance Use Topics  . Smoking status: Former Smoker    Types: Cigarettes    Quit date: 09/28/2004  . Smokeless tobacco: Never Used  . Alcohol Use: 0.0 oz/week    0 Standard drinks or equivalent per week     Comment: occ/social (moderate)    Review of Systems  DATA OBTAINED: from patient, nurse, medical record, family member GENERAL:  no fevers, fatigue, appetite changes SKIN: No itching, rash HEENT: No complaint RESPIRATORY: No cough, wheezing, SOB CARDIAC: No chest pain, palpitations, lower extremity edema  GI: No abdominal pain, No N/V/D or constipation, No heartburn or reflux  GU: No dysuria, frequency or urgency, or incontinence  MUSCULOSKELETAL: No unrelieved bone/joint pain NEUROLOGIC: No headache, dizziness  PSYCHIATRIC: No overt anxiety or sadness  Filed Vitals:   01/04/16 1845  BP: 142/80  Pulse: 78  Temp: 98.6 F (37 C)  Resp: 19    Physical Exam  GENERAL APPEARANCE: Alert, conversant, No acute distress  SKIN: No diaphoresis rash,  or wounds HEENT: Unremarkable RESPIRATORY: Breathing is even, unlabored. Lung sounds are clear   CARDIOVASCULAR: Heart RRR no murmurs, rubs or gallops. No peripheral edema  GASTROINTESTINAL: Abdomen is soft, non-tender, not distended w/ normal bowel sounds.  GENITOURINARY: Bladder non tender, not distended  MUSCULOSKELETAL: No abnormal joints or musculature NEUROLOGIC: Cranial nerves 2-12 grossly intact. Moves all extremities PSYCHIATRIC: Mood and affect appropriate to situation, no behavioral issues  Patient Active Problem List   Diagnosis Date Noted  . Hypoglycemia 12/07/2015  . Failure to thrive in adult 11/27/2015  . Vascular dementia without behavioral disturbance 11/27/2015  . Acute respiratory failure with hypoxia (HCC) 11/22/2015  . Acute kidney injury superimposed on chronic kidney disease (HCC) 11/22/2015  . Sepsis (HCC) 11/22/2015  . HCAP (healthcare-associated pneumonia) 11/22/2015  . DNR (do not resuscitate) 11/22/2015  . Palliative care encounter   . Sick 11/18/2015  . Depression 11/07/2015  . Hypotension 10/29/2015  . Slurred speech   . Hyperglycemia 10/18/2015  . Hyperglycemic crisis in diabetes mellitus (HCC) 10/18/2015  . Aspiration pneumonia (HCC) 10/18/2015  . Acute encephalopathy   . Acute hyperglycemia   . Diarrhea 10/16/2015  . Renal mass, right: Incentendal finding MRI 10/02/2015 10/03/2015  . Hypogonadism in male 10/03/2015  . Dehydration   . Hyperglycemia due to type 1 diabetes mellitus (HCC) 10/02/2015  . Encephalopathy, metabolic   . Heme positive stool 10/01/2015  . Elevated CEA 09/30/2015  . Elevated CA 19-9 level 09/30/2015  . CKD (chronic kidney disease), stage II 09/30/2015  . COPD with exacerbation (HCC) 09/27/2015  . Narcotic dependency, continuous (HCC) 09/27/2015  . AKI (acute kidney injury) (HCC) 09/27/2015  . DKA (diabetic ketoacidosis) (HCC) 09/14/2015  . Narcotic abuse   . Hyperglycemia without ketosis 09/13/2015  . DKA (diabetic  ketoacidoses) (HCC) 09/06/2015  . Partial symptomatic epilepsy with complex partial seizures, not intractable, without status epilepticus (HCC) 07/16/2015  . Dementia without behavioral disturbance 07/16/2015  . Hypersomnia with long sleep time, idiopathic 07/16/2015  . Pruritus of scalp 05/04/2015  . Loose stools 05/04/2015  . Absence attack (HCC) 05/04/2015  . B12 deficiency 05/04/2015  . Seizures (HCC) 04/10/2015  . Cerebrovascular disease 04/10/2015  . PVD (peripheral vascular disease) (HCC) 02/18/2015  . Testosterone deficiency 01/02/2015  . Diabetes mellitus type 1 with peripheral artery disease (  HCC) 01/02/2015  . Right-sided chest wall pain 11/27/2014  . Ecchymosis 08/29/2014  . Anemia- chronic 04/03/2014  . CAD- moderate3V disease at cath 7/15- medical Rx 03/28/2014  . Dyslipidemia 03/13/2014  . Non-STEMI (non-ST elevated myocardial infarction) (HCC) 03/10/2014  . 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 Low back pain potentially associated with spinal stenosis 02/26/2014  . ADHD, adult residual type 05/31/2013  . Elevated AST (SGOT) 01/23/2013  . SHOULDER PAIN, LEFT 03/04/2009  . Anxiety state 07/18/2008  . IRRITABLE BOWEL SYNDROME 04/27/2008  . NOCTURIA 03/13/2008  . COPD (chronic obstructive pulmonary disease) (HCC) 02/27/2008  . Chronic diarrhea 01/01/2008  . Mild nonproliferative diabetic retinopathy (HCC) 11/18/2007  . LEG PAIN, RIGHT 09/29/2007  . POLYNEUROPATHY IN DIABETES 02/09/2007  . HYPOTENSION, ORTHOSTATIC 02/09/2007  . Hypothyroidism 09/27/2006  . Type 1 diabetes mellitus with neurological manifestations, uncontrolled (HCC) 09/27/2006  . ERECTILE DYSFUNCTION 09/27/2006  . Essential hypertension 09/27/2006  . SPINAL STENOSIS, CERVICAL 09/27/2006  . Hypersomnia 09/27/2006    CBC    Component Value Date/Time   WBC 17.1* 11/24/2015 0442   WBC 5.0 06/28/2015 1621   WBC 5.3 11/29/2013 1507   RBC 2.98* 11/24/2015  0442   RBC 3.51* 06/28/2015 1621   RBC 3.37* 11/29/2013 1507   RBC 3.02* 11/26/2008 0727   HGB 8.6* 11/24/2015 0442   HGB 11.2* 11/29/2013 1507   HCT 26.5* 11/24/2015 0442   HCT 33.8* 06/28/2015 1621   HCT 34.4* 11/29/2013 1507   PLT 323 11/24/2015 0442   PLT 231 06/28/2015 1621   PLT 215 11/29/2013 1507   MCV 88.9 11/24/2015 0442   MCV 96 06/28/2015 1621   MCV 102.1* 11/29/2013 1507   LYMPHSABS 0.5* 11/22/2015 0441   LYMPHSABS 1.3 06/28/2015 1621   LYMPHSABS 0.9 11/29/2013 1507   MONOABS 0.2 11/22/2015 0441   MONOABS 0.5 11/29/2013 1507   EOSABS 0.0 11/22/2015 0441   EOSABS 0.2 06/28/2015 1621   EOSABS 0.3 11/29/2013 1507   BASOSABS 0.0 11/22/2015 0441   BASOSABS 0.0 06/28/2015 1621   BASOSABS 0.0 11/29/2013 1507    CMP     Component Value Date/Time   NA 145 11/25/2015 0250   NA 138 06/28/2015 1621   NA 143 11/29/2013 1507   K 3.8 11/25/2015 0250   K 5.0 11/29/2013 1507   CL 103 11/25/2015 0250   CL 108* 11/07/2012 1507   CO2 29 11/25/2015 0250   CO2 26 11/29/2013 1507   GLUCOSE 104* 11/25/2015 0250   GLUCOSE 232* 06/28/2015 1621   GLUCOSE 156* 11/29/2013 1507   GLUCOSE 256* 11/07/2012 1507   BUN 19 11/25/2015 0250   BUN 31* 06/28/2015 1621   BUN 20.9 11/29/2013 1507   CREATININE 1.03 11/25/2015 0250   CREATININE 1.1 11/29/2013 1507   CALCIUM 8.9 11/25/2015 0250   CALCIUM 9.3 11/29/2013 1507   PROT 6.0* 11/23/2015 0309   PROT 6.6 06/28/2015 1621   PROT 6.6 11/29/2013 1507   ALBUMIN 2.2* 11/23/2015 0309   ALBUMIN 4.1 06/28/2015 1621   ALBUMIN 3.9 11/29/2013 1507   AST 39 11/23/2015 0309   AST 22 11/29/2013 1507   ALT 31 11/23/2015 0309   ALT 20 11/29/2013 1507   ALKPHOS 78 11/23/2015 0309   ALKPHOS 65 11/29/2013 1507   BILITOT 0.4 11/23/2015 0309   BILITOT 0.3 06/28/2015 1621   BILITOT 0.46 11/29/2013 1507   GFRNONAA >60 11/25/2015 0250   GFRAA >60 11/25/2015 0250    Assessment and Plan  Partial  symptomatic epilepsy with complex partial  seizures, not intractable, without status epilepticus (HCC) No known seizures since hospitalization;plan - cont keppra 1000 mg BID and lyrica 50 mg q d and 100 mg q Hs  Essential hypertension Metoprolol didn't get restarted so will try again;bp is stable  Hypothyroidism Controlled; cont synthroid 75 mcg daily   Margit Hanks, MD

## 2016-01-04 NOTE — Assessment & Plan Note (Signed)
No known seizures since return from hospital;plan - cont

## 2016-01-04 NOTE — Assessment & Plan Note (Signed)
Controlled; cont synthroid 75 mcg daily

## 2016-01-05 ENCOUNTER — Encounter: Payer: Self-pay | Admitting: Internal Medicine

## 2016-01-05 NOTE — Assessment & Plan Note (Signed)
Benjamin Hodge is in that part of dementia where he knows something is wrong but can't reason it out and forget parts of it.His son is trying to deal with this.We discussed some strategies. Pt does have some paranoia so I have written for seroquel at night. Also pt has a great deal of anxiety so we discussed low dose ativan and i have written for that as well

## 2016-01-05 NOTE — Assessment & Plan Note (Signed)
I havre decreased pt's lexapro dose to 20 mg; I don't think that the 30 mg will be better than 20 mg.

## 2016-01-05 NOTE — Progress Notes (Signed)
MRN: [id number] Name: Benjamin Hodge  Sex: male Age: 75 y.o. DOB: 12-21-1940  PSC #: Ronni Rumble Facility/Room: Level Of Care: SNF Provider: Merrilee Seashore D Emergency Contacts: Extended Emergency Contact Information Primary Emergency Contact: Fonder,[name]  [place] Home Phone: [phone] Work Phone: [phone] Relation: Son Secondary Emergency Contact: Plasse,Christina  [place] Home Phone: [phone] Relation: Other  Code Status:   Allergies: Ace inhibitors; Cymbalta; Lipitor; Bee venom; Glucerna; and Angiotensin receptor blockers  Chief Complaint  Patient presents with  . encounter for family conference    HPI: Patient is 75 y.o. male whose son has several concerns he wold like to discuss with me.These are outlined below.   Past Medical History  Diagnosis Date  . CAD (coronary artery disease)     LAD 40-50% stenosis, first diagonal small 90% stenosis, circumflex 70% stenosis, OM 80% stenosis, right coronary artery 80-90% stenosis.  . Right upper lobe pneumonia 11/28/2008  . Anxiety 07/18/2008  . Irritable bowel syndrome 04/27/2008  . COPD (chronic obstructive pulmonary disease) (HCC) 02/27/2008  . B12 deficiency 02/16/2008    in 5/09: B12 262, MMA 1040  . Chronic diarrhea 01/01/2008    s/p EGD 10/06: H pylori + gastritis, duodenal biopsy normal (Dr. Elnoria Howard); s/p colonoscopy 10/06: hemorrhoids (Dr. Elnoria Howard); evaluated by Dr. Yancey Flemings  in 8/09: tissue transglutaminase AB negative, VIP normal, stool fat content normal, urine 5-HIAA normal; normal GES 11/09 (done for "fluctuating sugars")  . 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 Orthostatic hypotension 02/09/2007  . Hypertension 02/09/2007  . Spinal stenosis in cervical region 05/03    MRI  . Erectile dysfunction 09/27/2006    s/p penile implant  . Anemia, iron deficiency 09/27/2006    neg colonoscopy 2006 - Dr. Elnoria Howard; ferritin 152; hgb  15.7  on  07/07  . Hypothyroidism 09/27/2006    TSH 2.639  07/07  . Hypersomnia 09/27/2006    evaluated by Dr. Jetty Duhamel (5/08) and Dr. Vickey Huger; PSG 8/09: chronic delayed sleep phase syndrome (patient sleeps during the day and is awake at night), nocturnal myoclonus (eval for RLS and IDA suggested). Pt advised to change sleeping behavior  . Diabetes mellitus type II 09/07/1984    poorly controlled, complicated by peripheral neuropathy, microalbuminuria, mild non proliferative retinopathy, s/p DKA 7/05, on insulin pump x 4/09, started a pump vacation on 11/19/2008  . Hyperlipidemia   . 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 Hemorrhoids   . Chronic kidney disease   . Unspecified hereditary and idiopathic peripheral neuropathy   . Other chronic pain   . Other malaise and fatigue   . Depression   . Hypertrophy of prostate without urinary obstruction and other lower urinary tract symptoms (LUTS)   . Lumbago   . Heart attack (HCC) 03/2014    mild  . Renal mass, right: Incentendal finding MRI 10/02/2015 10/03/2015  . Hypogonadism in male 10/03/2015  . Asthma   . Acute respiratory failure with hypoxia (HCC)   . Type 1 diabetes mellitus with neurological manifestations, uncontrolled (HCC)   . Essential hypertension   . COPD (chronic obstructive pulmonary disease) (HCC)   . CAD (coronary artery disease)     moderate 3V disease at cath 7/15- medical Rx  . Anemia, chronic disease   . Seizures (HCC)   . B12 deficiency   . Dementia without behavioral disturbance   . Acute encephalopathy   . Acute kidney injury superimposed on chronic kidney disease (HCC)   . Sepsis (HCC)   .  HCAP (healthcare-associated pneumonia)   . Diabetes mellitus type 1 with peripheral artery disease (HCC) 01/02/2015    Past Surgical History  Procedure Laterality Date  . Penile prosthesis implant    . Tonsillectomy    . Colonoscopy  06/25/2005    Dr. Jeani Hawking  . Left heart catheterization with coronary angiogram [name] 03/13/2014     Procedure: LEFT HEART CATHETERIZATION WITH CORONARY ANGIOGRAM;  Surgeon: Peter M Swaziland, MD;  Location: [facility] CATH LAB;  Service: Cardiovascular;  Laterality: [name];  . Peripheral vascular catheterization [name] 02/19/2015    Procedure: Abdominal Aortogram;  Surgeon: Nada Libman, MD;  Location: [facility] INVASIVE CV LAB;  Service: Cardiovascular;  Laterality: [name];  . Peripheral vascular catheterization Left 02/19/2015    Procedure: Lower Extremity Angiography;  Surgeon: Nada Libman, MD;  Location: [facility] INVASIVE CV LAB;  Service: Cardiovascular;  Laterality: Left;      Medication List       This list is accurate as of: 01/02/16 11:59 PM.  Always use your most recent med list.               amoxicillin-clavulanate 875-125 MG tablet  Commonly known as:  AUGMENTIN  Take 1 tablet by mouth 2 (two) times daily.     diphenoxylate-atropine 2.5-0.025 MG tablet  Commonly known as:  LOMOTIL  Take 1 tablet by mouth every 6 (six) hours as needed for diarrhea or loose stools.     escitalopram 20 MG tablet  Commonly known as:  LEXAPRO  TAKE ONE TABLET BY MOUTH ONE TIME DAILY     insulin aspart 100 UNIT/ML injection  Commonly known as:  novoLOG  CBG 151-200= 2 units; 201- 250 = 3 units; 251-300 = 5 units; 301- 350 =7 units; 351- 400= 9 units; 401- 450 = 11 units. CBG > 450, Call MD.     insulin aspart 100 UNIT/ML injection  Commonly known as:  novoLOG  Inject 4 Units into the skin 3 (three) times daily with meals.     Insulin Glargine 300 UNIT/ML Sopn  Commonly known as:  TOUJEO SOLOSTAR  Inject 10 Units into the skin at bedtime.     levETIRAcetam 1000 MG tablet  Commonly known as:  KEPPRA  Take 1 tablet (1,000 mg total) by mouth 2 (two) times daily. For seizures     levothyroxine 75 MCG tablet  Commonly known as:  SYNTHROID, LEVOTHROID  Take 1 tablet (75 mcg total) by mouth daily.     morphine 20 MG/ML concentrated solution  Commonly known as:  ROXANOL  Take 5 mg by mouth every 2 (two) hours  as needed for severe pain.     multivitamin with minerals Tabs tablet  Take 1 tablet by mouth daily.     pantoprazole 40 MG tablet  Commonly known as:  PROTONIX  TAKE ONE TABLET BY MOUTH ONE TIME DAILY     pregabalin 100 MG capsule  Commonly known as:  LYRICA  Take 100 mg by mouth at bedtime.     pregabalin 50 MG capsule  Commonly known as:  LYRICA  Take 50 mg by mouth daily.     saccharomyces boulardii 250 MG capsule  Commonly known as:  FLORASTOR  Take 1 capsule (250 mg total) by mouth every evening.     vitamin B-12 1000 MCG tablet  Commonly known as:  CYANOCOBALAMIN  Take 1,000 mcg by mouth daily.        No orders of the defined types were placed in this  encounter.    Immunization History  Administered Date(s) Administered  . Influenza Split 07/27/2011  . Influenza,inj,Quad PF,36+ Mos 07/20/2013, 06/06/2014, 07/02/2015  . Influenza-Unspecified 06/06/2009, 07/29/2010  . Pneumococcal Conjugate-13 07/02/2015  . Pneumococcal Polysaccharide-23 07/21/2012  . Td 09/14/2006    Social History  Substance Use Topics  . Smoking status: Former Smoker    Types: Cigarettes    Quit date: 09/28/2004  . Smokeless tobacco: Never Used  . Alcohol Use: 0.0 oz/week    0 Standard drinks or equivalent per week     Comment: occ/social (moderate)    Review of Systems  DATA OBTAINED: from patient,pt's son GENERAL:  no fevers, fatigue, appetite changes SKIN: No itching, rash HEENT: No complaint RESPIRATORY: No cough, wheezing, SOB CARDIAC: No chest pain, palpitations, lower extremity edema  GI: No abdominal pain, No N/V/D or constipation, No heartburn or reflux  GU: No dysuria, frequency or urgency, or incontinence  MUSCULOSKELETAL: No unrelieved bone/joint pain NEUROLOGIC: No headache, dizziness  PSYCHIATRIC: + anxiety  Filed Vitals:   01/05/16 1024  BP: 142/80  Pulse: 78  Temp: 98.6 F (37 C)  Resp: 19    Physical Exam  GENERAL APPEARANCE: Alert, conversant, No  acute distress  SKIN: No diaphoresis rash HEENT: Unremarkable RESPIRATORY: Breathing is even, unlabored. Lung sounds are clear   CARDIOVASCULAR: Heart RRR no murmurs, rubs or gallops. No peripheral edema  GASTROINTESTINAL: Abdomen is soft, non-tender, not distended w/ normal bowel sounds.  GENITOURINARY: Bladder non tender, not distended  MUSCULOSKELETAL: No abnormal joints or musculature NEUROLOGIC: Cranial nerves 2-12 grossly intact. Moves all extremities PSYCHIATRIC: Mood and affect appropriate to situation with dementia, no behavioral issues  Patient Active Problem List   Diagnosis Date Noted  . Hypoglycemia 12/07/2015  . Failure to thrive in adult 11/27/2015  . Vascular dementia without behavioral disturbance 11/27/2015  . Acute respiratory failure with hypoxia (HCC) 11/22/2015  . Acute kidney injury superimposed on chronic kidney disease (HCC) 11/22/2015  . Sepsis (HCC) 11/22/2015  . HCAP (healthcare-associated pneumonia) 11/22/2015  . DNR (do not resuscitate) 11/22/2015  . Palliative care encounter   . Sick 11/18/2015  . Depression 11/07/2015  . Hypotension 10/29/2015  . Slurred speech   . Hyperglycemia 10/18/2015  . Hyperglycemic crisis in diabetes mellitus (HCC) 10/18/2015  . Aspiration pneumonia (HCC) 10/18/2015  . Acute encephalopathy   . Acute hyperglycemia   . Diarrhea 10/16/2015  . Renal mass, right: Incentendal finding MRI 10/02/2015 10/03/2015  . Hypogonadism in male 10/03/2015  . Dehydration   . Hyperglycemia due to type 1 diabetes mellitus (HCC) 10/02/2015  . Encephalopathy, metabolic   . Heme positive stool 10/01/2015  . Elevated CEA 09/30/2015  . Elevated CA 19-9 level 09/30/2015  . CKD (chronic kidney disease), stage II 09/30/2015  . COPD with exacerbation (HCC) 09/27/2015  . Narcotic dependency, continuous (HCC) 09/27/2015  . AKI (acute kidney injury) (HCC) 09/27/2015  . DKA (diabetic ketoacidosis) (HCC) 09/14/2015  . Narcotic abuse   . Hyperglycemia  without ketosis 09/13/2015  . DKA (diabetic ketoacidoses) (HCC) 09/06/2015  . Partial symptomatic epilepsy with complex partial seizures, not intractable, without status epilepticus (HCC) 07/16/2015  . Dementia without behavioral disturbance 07/16/2015  . Hypersomnia with long sleep time, idiopathic 07/16/2015  . Pruritus of scalp 05/04/2015  . Loose stools 05/04/2015  . Absence attack (HCC) 05/04/2015  . B12 deficiency 05/04/2015  . Seizures (HCC) 04/10/2015  . Cerebrovascular disease 04/10/2015  . PVD (peripheral vascular disease) (HCC) 02/18/2015  . Testosterone deficiency 01/02/2015  . Diabetes  mellitus type 1 with peripheral artery disease (HCC) 01/02/2015  . Right-sided chest wall pain 11/27/2014  . Ecchymosis 08/29/2014  . Anemia- chronic 04/03/2014  . CAD- moderate3V disease at cath 7/15- medical Rx 03/28/2014  . Dyslipidemia 03/13/2014  . Non-STEMI (non-ST elevated myocardial infarction) (HCC) 03/10/2014  . 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 Low back pain potentially associated with spinal stenosis 02/26/2014  . ADHD, adult residual type 05/31/2013  . Elevated AST (SGOT) 01/23/2013  . SHOULDER PAIN, LEFT 03/04/2009  . Anxiety state 07/18/2008  . IRRITABLE BOWEL SYNDROME 04/27/2008  . NOCTURIA 03/13/2008  . COPD (chronic obstructive pulmonary disease) (HCC) 02/27/2008  . Chronic diarrhea 01/01/2008  . Mild nonproliferative diabetic retinopathy (HCC) 11/18/2007  . LEG PAIN, RIGHT 09/29/2007  . POLYNEUROPATHY IN DIABETES 02/09/2007  . HYPOTENSION, ORTHOSTATIC 02/09/2007  . Hypothyroidism 09/27/2006  . Type 1 diabetes mellitus with neurological manifestations, uncontrolled (HCC) 09/27/2006  . ERECTILE DYSFUNCTION 09/27/2006  . Essential hypertension 09/27/2006  . SPINAL STENOSIS, CERVICAL 09/27/2006  . Hypersomnia 09/27/2006    CBC    Component Value Date/Time   WBC 17.1* 11/24/2015 0442   WBC 5.0 06/28/2015 1621   WBC  5.3 11/29/2013 1507   RBC 2.98* 11/24/2015 0442   RBC 3.51* 06/28/2015 1621   RBC 3.37* 11/29/2013 1507   RBC 3.02* 11/26/2008 0727   HGB 8.6* 11/24/2015 0442   HGB 11.2* 11/29/2013 1507   HCT 26.5* 11/24/2015 0442   HCT 33.8* 06/28/2015 1621   HCT 34.4* 11/29/2013 1507   PLT 323 11/24/2015 0442   PLT 231 06/28/2015 1621   PLT 215 11/29/2013 1507   MCV 88.9 11/24/2015 0442   MCV 96 06/28/2015 1621   MCV 102.1* 11/29/2013 1507   LYMPHSABS 0.5* 11/22/2015 0441   LYMPHSABS 1.3 06/28/2015 1621   LYMPHSABS 0.9 11/29/2013 1507   MONOABS 0.2 11/22/2015 0441   MONOABS 0.5 11/29/2013 1507   EOSABS 0.0 11/22/2015 0441   EOSABS 0.2 06/28/2015 1621   EOSABS 0.3 11/29/2013 1507   BASOSABS 0.0 11/22/2015 0441   BASOSABS 0.0 06/28/2015 1621   BASOSABS 0.0 11/29/2013 1507    CMP     Component Value Date/Time   NA 145 11/25/2015 0250   NA 138 06/28/2015 1621   NA 143 11/29/2013 1507   K 3.8 11/25/2015 0250   K 5.0 11/29/2013 1507   CL 103 11/25/2015 0250   CL 108* 11/07/2012 1507   CO2 29 11/25/2015 0250   CO2 26 11/29/2013 1507   GLUCOSE 104* 11/25/2015 0250   GLUCOSE 232* 06/28/2015 1621   GLUCOSE 156* 11/29/2013 1507   GLUCOSE 256* 11/07/2012 1507   BUN 19 11/25/2015 0250   BUN 31* 06/28/2015 1621   BUN 20.9 11/29/2013 1507   CREATININE 1.03 11/25/2015 0250   CREATININE 1.1 11/29/2013 1507   CALCIUM 8.9 11/25/2015 0250   CALCIUM 9.3 11/29/2013 1507   PROT 6.0* 11/23/2015 0309   PROT 6.6 06/28/2015 1621   PROT 6.6 11/29/2013 1507   ALBUMIN 2.2* 11/23/2015 0309   ALBUMIN 4.1 06/28/2015 1621   ALBUMIN 3.9 11/29/2013 1507   AST 39 11/23/2015 0309   AST 22 11/29/2013 1507   ALT 31 11/23/2015 0309   ALT 20 11/29/2013 1507   ALKPHOS 78 11/23/2015 0309   ALKPHOS 65 11/29/2013 1507   BILITOT 0.4 11/23/2015 0309   BILITOT 0.3 06/28/2015 1621   BILITOT 0.46 11/29/2013 1507   GFRNONAA >60 11/25/2015 0250   GFRAA >60 11/25/2015 0250  Assessment and Plan  Dementia  without behavioral disturbance Dad is in that part of dementia where he knows something is wrong but can't reason it out and forget parts of it.His son is trying to deal with this.We discussed some strategies. Pt does have some paranoia so I have written for seroquel at night. Also pt has a great deal of anxiety so we discussed low dose ativan and i have written for that as well  Anxiety state Have started low dose ativan BID per our conversation with POA  Depression I havre decreased pt's lexapro dose to 20 mg; I don't think that the 30 mg will be better than 20 mg.   Time spent > 35 min;> 50% of time with patient was spent reviewing records, labs, tests and studies, counseling and developing plan of care  Margit Hanks, MD

## 2016-01-05 NOTE — Assessment & Plan Note (Signed)
Have started low dose ativan BID per our conversation with POA

## 2016-01-06 DIAGNOSIS — E1022 Type 1 diabetes mellitus with diabetic chronic kidney disease: Secondary | ICD-10-CM | POA: Diagnosis not present

## 2016-01-06 DIAGNOSIS — R262 Difficulty in walking, not elsewhere classified: Secondary | ICD-10-CM | POA: Diagnosis not present

## 2016-01-06 DIAGNOSIS — M6281 Muscle weakness (generalized): Secondary | ICD-10-CM | POA: Diagnosis not present

## 2016-01-07 DIAGNOSIS — E1022 Type 1 diabetes mellitus with diabetic chronic kidney disease: Secondary | ICD-10-CM | POA: Diagnosis not present

## 2016-01-07 DIAGNOSIS — M6281 Muscle weakness (generalized): Secondary | ICD-10-CM | POA: Diagnosis not present

## 2016-01-07 DIAGNOSIS — R262 Difficulty in walking, not elsewhere classified: Secondary | ICD-10-CM | POA: Diagnosis not present

## 2016-01-08 DIAGNOSIS — E1022 Type 1 diabetes mellitus with diabetic chronic kidney disease: Secondary | ICD-10-CM | POA: Diagnosis not present

## 2016-01-08 DIAGNOSIS — M6281 Muscle weakness (generalized): Secondary | ICD-10-CM | POA: Diagnosis not present

## 2016-01-08 DIAGNOSIS — R262 Difficulty in walking, not elsewhere classified: Secondary | ICD-10-CM | POA: Diagnosis not present

## 2016-01-09 DIAGNOSIS — R262 Difficulty in walking, not elsewhere classified: Secondary | ICD-10-CM | POA: Diagnosis not present

## 2016-01-09 DIAGNOSIS — E1022 Type 1 diabetes mellitus with diabetic chronic kidney disease: Secondary | ICD-10-CM | POA: Diagnosis not present

## 2016-01-09 DIAGNOSIS — M6281 Muscle weakness (generalized): Secondary | ICD-10-CM | POA: Diagnosis not present

## 2016-01-10 DIAGNOSIS — M6281 Muscle weakness (generalized): Secondary | ICD-10-CM | POA: Diagnosis not present

## 2016-01-10 DIAGNOSIS — R262 Difficulty in walking, not elsewhere classified: Secondary | ICD-10-CM | POA: Diagnosis not present

## 2016-01-10 DIAGNOSIS — E1022 Type 1 diabetes mellitus with diabetic chronic kidney disease: Secondary | ICD-10-CM | POA: Diagnosis not present

## 2016-01-12 DIAGNOSIS — R262 Difficulty in walking, not elsewhere classified: Secondary | ICD-10-CM | POA: Diagnosis not present

## 2016-01-12 DIAGNOSIS — M6281 Muscle weakness (generalized): Secondary | ICD-10-CM | POA: Diagnosis not present

## 2016-01-12 DIAGNOSIS — E1022 Type 1 diabetes mellitus with diabetic chronic kidney disease: Secondary | ICD-10-CM | POA: Diagnosis not present

## 2016-01-13 ENCOUNTER — Encounter (HOSPITAL_COMMUNITY): Payer: Self-pay | Admitting: Family Medicine

## 2016-01-13 ENCOUNTER — Emergency Department (HOSPITAL_COMMUNITY)
Admission: EM | Admit: 2016-01-13 | Discharge: 2016-02-06 | Disposition: E | Payer: Medicare Other | Attending: Emergency Medicine | Admitting: Emergency Medicine

## 2016-01-13 ENCOUNTER — Emergency Department (HOSPITAL_COMMUNITY): Payer: Medicare Other

## 2016-01-13 DIAGNOSIS — F0390 Unspecified dementia without behavioral disturbance: Secondary | ICD-10-CM | POA: Insufficient documentation

## 2016-01-13 DIAGNOSIS — J45909 Unspecified asthma, uncomplicated: Secondary | ICD-10-CM | POA: Insufficient documentation

## 2016-01-13 DIAGNOSIS — J449 Chronic obstructive pulmonary disease, unspecified: Secondary | ICD-10-CM | POA: Diagnosis not present

## 2016-01-13 DIAGNOSIS — I129 Hypertensive chronic kidney disease with stage 1 through stage 4 chronic kidney disease, or unspecified chronic kidney disease: Secondary | ICD-10-CM | POA: Insufficient documentation

## 2016-01-13 DIAGNOSIS — I959 Hypotension, unspecified: Secondary | ICD-10-CM | POA: Diagnosis not present

## 2016-01-13 DIAGNOSIS — Z79891 Long term (current) use of opiate analgesic: Secondary | ICD-10-CM | POA: Insufficient documentation

## 2016-01-13 DIAGNOSIS — N189 Chronic kidney disease, unspecified: Secondary | ICD-10-CM | POA: Diagnosis not present

## 2016-01-13 DIAGNOSIS — Z87891 Personal history of nicotine dependence: Secondary | ICD-10-CM | POA: Insufficient documentation

## 2016-01-13 DIAGNOSIS — I214 Non-ST elevation (NSTEMI) myocardial infarction: Secondary | ICD-10-CM | POA: Diagnosis not present

## 2016-01-13 DIAGNOSIS — I2510 Atherosclerotic heart disease of native coronary artery without angina pectoris: Secondary | ICD-10-CM | POA: Insufficient documentation

## 2016-01-13 DIAGNOSIS — R262 Difficulty in walking, not elsewhere classified: Secondary | ICD-10-CM | POA: Diagnosis not present

## 2016-01-13 DIAGNOSIS — E785 Hyperlipidemia, unspecified: Secondary | ICD-10-CM | POA: Diagnosis not present

## 2016-01-13 DIAGNOSIS — Z79899 Other long term (current) drug therapy: Secondary | ICD-10-CM | POA: Diagnosis not present

## 2016-01-13 DIAGNOSIS — Z792 Long term (current) use of antibiotics: Secondary | ICD-10-CM | POA: Insufficient documentation

## 2016-01-13 DIAGNOSIS — F329 Major depressive disorder, single episode, unspecified: Secondary | ICD-10-CM | POA: Insufficient documentation

## 2016-01-13 DIAGNOSIS — E1022 Type 1 diabetes mellitus with diabetic chronic kidney disease: Secondary | ICD-10-CM | POA: Diagnosis not present

## 2016-01-13 DIAGNOSIS — M6281 Muscle weakness (generalized): Secondary | ICD-10-CM | POA: Diagnosis not present

## 2016-01-13 DIAGNOSIS — I469 Cardiac arrest, cause unspecified: Secondary | ICD-10-CM | POA: Insufficient documentation

## 2016-01-13 DIAGNOSIS — R570 Cardiogenic shock: Secondary | ICD-10-CM

## 2016-01-13 DIAGNOSIS — R404 Transient alteration of awareness: Secondary | ICD-10-CM | POA: Diagnosis not present

## 2016-01-13 DIAGNOSIS — Z7951 Long term (current) use of inhaled steroids: Secondary | ICD-10-CM | POA: Insufficient documentation

## 2016-01-13 DIAGNOSIS — R739 Hyperglycemia, unspecified: Secondary | ICD-10-CM | POA: Diagnosis not present

## 2016-01-13 DIAGNOSIS — R4182 Altered mental status, unspecified: Secondary | ICD-10-CM | POA: Diagnosis present

## 2016-01-13 LAB — URINALYSIS, ROUTINE W REFLEX MICROSCOPIC
Bilirubin Urine: NEGATIVE
Glucose, UA: >1000 mg/dL — AB
Hgb urine dipstick: NEGATIVE
Ketones, ur: 15 mg/dL — AB
Leukocytes, UA: NEGATIVE
Nitrite: NEGATIVE
Protein, ur: NEGATIVE mg/dL
Specific Gravity, Urine: 1.023 (ref 1.005–1.030)
pH: 5 (ref 5.0–8.0)

## 2016-01-13 LAB — I-STAT CG4 LACTIC ACID, ED: Lactic Acid, Venous: 5.51 mmol/L (ref 0.5–2.0)

## 2016-01-13 LAB — URINE MICROSCOPIC-ADD ON

## 2016-01-13 LAB — CBG MONITORING, ED

## 2016-01-13 MED ORDER — PIPERACILLIN-TAZOBACTAM 3.375 G IVPB
3.3750 g | Freq: Once | INTRAVENOUS | Status: AC
  Administered 2016-01-13: 3.375 g via INTRAVENOUS
  Filled 2016-01-13: qty 50

## 2016-01-13 MED ORDER — VANCOMYCIN HCL IN DEXTROSE 1-5 GM/200ML-% IV SOLN
1000.0000 mg | Freq: Once | INTRAVENOUS | Status: AC
  Administered 2016-01-13: 1000 mg via INTRAVENOUS
  Filled 2016-01-13: qty 200

## 2016-01-13 MED ORDER — SODIUM BICARBONATE 8.4 % IV SOLN
50.0000 meq | Freq: Once | INTRAVENOUS | Status: AC
  Administered 2016-01-13: 50 meq via INTRAVENOUS
  Filled 2016-01-13: qty 50

## 2016-01-13 MED ORDER — PANTOPRAZOLE SODIUM 40 MG IV SOLR
40.0000 mg | Freq: Once | INTRAVENOUS | Status: AC
  Administered 2016-01-14: 40 mg via INTRAVENOUS
  Filled 2016-01-13: qty 40

## 2016-01-13 MED ORDER — ASPIRIN 300 MG RE SUPP
300.0000 mg | Freq: Once | RECTAL | Status: AC
  Administered 2016-01-13: 300 mg via RECTAL
  Filled 2016-01-13: qty 1

## 2016-01-13 MED ORDER — SODIUM CHLORIDE 0.9 % IV BOLUS (SEPSIS)
2000.0000 mL | Freq: Once | INTRAVENOUS | Status: AC
  Administered 2016-01-13: 2000 mL via INTRAVENOUS

## 2016-01-13 MED ORDER — SODIUM CHLORIDE 0.9 % IV SOLN
1.0000 g | Freq: Once | INTRAVENOUS | Status: AC
  Administered 2016-01-13: 1 g via INTRAVENOUS
  Filled 2016-01-13: qty 10

## 2016-01-13 NOTE — ED Notes (Signed)
Patient is from [facility] and transported by [facility]. Patient need evaluation for hyperglycemia and altered mental status. Patient is reading "high" on EMS glucometer. Patients eyes are pinpoint and non reactive. Facility denied any administer of morphine.

## 2016-01-13 NOTE — ED Notes (Signed)
Attempted x 2 to obtain blood for previously hembolized blood. Wilmer Floor RN is obtaining blood from IV in right hand.

## 2016-01-13 NOTE — ED Notes (Signed)
Radiology at bedside. Notified RT about ABG order.

## 2016-01-13 NOTE — ED Notes (Signed)
Bed: OV70 Expected date:  Expected time:  Means of arrival:  Comments: 74yo F lethargic hyperglycemia hypotensive

## 2016-01-13 NOTE — ED Notes (Signed)
Patients family is at bedside and speaking with Dr. Marylen Ponto. Pt has went back into having agonal breathing with being bradycardiac at 39. Oxygen is 99% on non-rebreather at 15L/min. Pt's skin is pink but pale.

## 2016-01-13 NOTE — ED Notes (Signed)
Critical Lactic Acid value of 5.51 given to Dr.Kohut

## 2016-01-13 NOTE — ED Provider Notes (Signed)
CSN: [id number]     Arrival date & time Jan 17, 2016  2131 History   First MD Initiated Contact with Patient Jan 17, 2016 2204     Chief Complaint  Patient presents with  . Altered Mental Status  . Hyperglycemia     (Consider location/radiation/quality/duration/timing/severity/associated sxs/prior Treatment) HPI   75 year old male coming from nursing facility for evaluation of altered mental status and hyperglycemia. Patient himself is very confused and unable fragment much useful history. Unclear the exact onset of these symptoms. Patient does have a past history of multiple medical problems with several recent admissions just this year. His blood glucose reading EMS registered "high." No reported trauma. No fever. No reported vomiting.  Past Medical History  Diagnosis Date  . CAD (coronary artery disease)     LAD 40-50% stenosis, first diagonal small 90% stenosis, circumflex 70% stenosis, OM 80% stenosis, right coronary artery 80-90% stenosis.  . Right upper lobe pneumonia 11/28/2008  . Anxiety 07/18/2008  . Irritable bowel syndrome 04/27/2008  . COPD (chronic obstructive pulmonary disease) (HCC) 02/27/2008  . B12 deficiency 02/16/2008    in 5/09: B12 262, MMA 1040  . Chronic diarrhea 01/01/2008    s/p EGD 10/06: H pylori + gastritis, duodenal biopsy normal (Dr. Elnoria Howard); s/p colonoscopy 10/06: hemorrhoids (Dr. Elnoria Howard); evaluated by Dr. Yancey Flemings  in 8/09: tissue transglutaminase AB negative, VIP normal, stool fat content normal, urine 5-HIAA normal; normal GES 11/09 (done for "fluctuating sugars")  . 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 Orthostatic hypotension 02/09/2007  . Hypertension 02/09/2007  . Spinal stenosis in cervical region 05/03    MRI  . Erectile dysfunction 09/27/2006    s/p penile implant  . Anemia, iron deficiency 09/27/2006    neg colonoscopy 2006 - Dr. Elnoria Howard; ferritin 152; hgb  15.7  on 07/07  . Hypothyroidism 09/27/2006    TSH 2.639  07/07  .  Hypersomnia 09/27/2006    evaluated by Dr. Jetty Duhamel (5/08) and Dr. Vickey Huger; PSG 8/09: chronic delayed sleep phase syndrome (patient sleeps during the day and is awake at night), nocturnal myoclonus (eval for RLS and IDA suggested). Pt advised to change sleeping behavior  . Diabetes mellitus type II 09/07/1984    poorly controlled, complicated by peripheral neuropathy, microalbuminuria, mild non proliferative retinopathy, s/p DKA 7/05, on insulin pump x 4/09, started a pump vacation on 11/19/2008  . Hyperlipidemia   . 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 Hemorrhoids   . Chronic kidney disease   . Unspecified hereditary and idiopathic peripheral neuropathy   . Other chronic pain   . Other malaise and fatigue   . Depression   . Hypertrophy of prostate without urinary obstruction and other lower urinary tract symptoms (LUTS)   . Lumbago   . Heart attack (HCC) 03/2014    mild  . Renal mass, right: Incentendal finding MRI 10/02/2015 10/03/2015  . Hypogonadism in male 10/03/2015  . Asthma   . Acute respiratory failure with hypoxia (HCC)   . Type 1 diabetes mellitus with neurological manifestations, uncontrolled (HCC)   . Essential hypertension   . COPD (chronic obstructive pulmonary disease) (HCC)   . CAD (coronary artery disease)     moderate 3V disease at cath 7/15- medical Rx  . Anemia, chronic disease   . Seizures (HCC)   . B12 deficiency   . Dementia without behavioral disturbance   . Acute encephalopathy   . Acute kidney injury superimposed on chronic kidney disease (HCC)   . Sepsis (HCC)   . HCAP (healthcare-associated  pneumonia)   . Diabetes mellitus type 1 with peripheral artery disease (HCC) 01/02/2015   Past Surgical History  Procedure Laterality Date  . Penile prosthesis implant    . Tonsillectomy    . Colonoscopy  06/25/2005    Dr. Jeani Hawking  . Left heart catheterization with coronary angiogram [name] 03/13/2014    Procedure: LEFT HEART CATHETERIZATION WITH CORONARY ANGIOGRAM;   Surgeon: Peter M Swaziland, MD;  Location: [facility] CATH LAB;  Service: Cardiovascular;  Laterality: [name];  . Peripheral vascular catheterization [name] 02/19/2015    Procedure: Abdominal Aortogram;  Surgeon: Nada Libman, MD;  Location: [facility] INVASIVE CV LAB;  Service: Cardiovascular;  Laterality: [name];  . Peripheral vascular catheterization Left 02/19/2015    Procedure: Lower Extremity Angiography;  Surgeon: Nada Libman, MD;  Location: [facility] INVASIVE CV LAB;  Service: Cardiovascular;  Laterality: Left;   Family History  Problem Relation Age of Onset  . Bone cancer Mother   . Breast cancer Mother   . Cancer Mother   . Lung cancer Father   . Cancer Father   . Breast cancer Sister   . Cancer Sister   . Lung cancer Brother   . Cancer Sister     type unknown   Social History  Substance Use Topics  . Smoking status: Former Smoker    Types: Cigarettes    Quit date: 09/28/2004  . Smokeless tobacco: Never Used  . Alcohol Use: No    Review of Systems  All systems reviewed and negative, other than as noted in HPI.   Allergies  Ace inhibitors; Cymbalta; Lipitor; Bee venom; Glucerna; and Angiotensin receptor blockers  Home Medications   Prior to Admission medications   Medication Sig Start Date End Date Taking? Authorizing Provider  budesonide-formoterol (SYMBICORT) 160-4.5 MCG/ACT inhaler Inhale 2 puffs into the lungs 2 (two) times daily.   Yes Historical Provider, MD  escitalopram (LEXAPRO) 20 MG tablet TAKE ONE TABLET BY MOUTH ONE TIME DAILY 08/07/15  Yes Kimber Relic, MD  insulin aspart (NOVOLOG) 100 UNIT/ML injection Inject 4 Units into the skin 3 (three) times daily with meals. 11/04/15  Yes Zannie Cove, MD  Insulin Glargine (TOUJEO SOLOSTAR) 300 UNIT/ML SOPN Inject 10 Units into the skin at bedtime. 11/26/15  Yes Clydia Llano, MD  levETIRAcetam (KEPPRA) 1000 MG tablet Take 1 tablet (1,000 mg total) by mouth 2 (two) times daily. For seizures 10/22/15  Yes Charlestine Night, PA-C   levothyroxine (SYNTHROID, LEVOTHROID) 75 MCG tablet Take 1 tablet (75 mcg total) by mouth daily. 05/10/15  Yes Kimber Relic, MD  Multiple Vitamin (MULTIVITAMIN WITH MINERALS) TABS tablet Take 1 tablet by mouth daily. 02/22/15  Yes Osvaldo Shipper, MD  pantoprazole (PROTONIX) 40 MG tablet TAKE ONE TABLET BY MOUTH ONE TIME DAILY 05/14/15  Yes Kimber Relic, MD  pregabalin (LYRICA) 100 MG capsule Take 100 mg by mouth at bedtime.   Yes Historical Provider, MD  pregabalin (LYRICA) 75 MG capsule Take 75 mg by mouth daily.   Yes Historical Provider, MD  saccharomyces boulardii (FLORASTOR) 250 MG capsule Take 1 capsule (250 mg total) by mouth every evening. 10/21/15  Yes Hollice Espy, MD  vitamin B-12 (CYANOCOBALAMIN) 1000 MCG tablet Take 1,000 mcg by mouth daily.   Yes Historical Provider, MD  amoxicillin-clavulanate (AUGMENTIN) 875-125 MG tablet Take 1 tablet by mouth 2 (two) times daily. 11/26/15   Clydia Llano, MD  diphenoxylate-atropine (LOMOTIL) 2.5-0.025 MG tablet Take 1 tablet by mouth every 6 (six) hours as needed  for diarrhea or loose stools.    Historical Provider, MD  insulin aspart (NOVOLOG) 100 UNIT/ML injection CBG 151-200= 2 units; 201- 250 = 3 units; 251-300 = 5 units; 301- 350 =7 units; 351- 400= 9 units; 401- 450 = 11 units. CBG > 450, Call MD.    Historical Provider, MD  morphine (ROXANOL) 20 MG/ML concentrated solution Take 5 mg by mouth every 2 (two) hours as needed for severe pain.    Historical Provider, MD   BP 71/59 mmHg  Pulse 92  Temp(Src) 93.9 F (34.4 C) (Rectal)  Resp 23  SpO2 100% Physical Exam  Constitutional: He appears well-developed and well-nourished. No distress.  In obvious distress. Encephalopathic. Intermittently answering questions.  HENT:  Head: Normocephalic and atraumatic.  Eyes: Conjunctivae are normal. Right eye exhibits no discharge. Left eye exhibits no discharge.  Neck: Neck supple.  Cardiovascular: Normal rate, regular rhythm and normal heart  sounds.  Exam reveals no gallop and no friction rub.   No murmur heard. Pulmonary/Chest: He is in respiratory distress.  Respiratory distress. He is an accessory muscles. Deep, rapid breathing. Lungs themselves and clear.  Abdominal: Soft. He exhibits no distension. There is no tenderness.  Musculoskeletal: He exhibits no edema or tenderness.  Neurological:  Drowsy. Will open eyes to voice and responds some questioning although most answers are confused. Moving all extremities.  Skin:  Pale. Extremities cool to the touch  Nursing note and vitals reviewed.   ED Course  Procedures (including critical care time)  CRITICAL CARE Performed by: Raeford Razor Total critical care time: 40 minutes Critical care time was exclusive of separately billable procedures and treating other patients. Critical care was necessary to treat or prevent imminent or life-threatening deterioration. Critical care was time spent personally by me on the following activities: development of treatment plan with patient and/or surrogate as well as nursing, discussions with consultants, evaluation of patient's response to treatment, examination of patient, obtaining history from patient or surrogate, ordering and performing treatments and interventions, ordering and review of laboratory studies, ordering and review of radiographic studies, pulse oximetry and re-evaluation of patient's condition.  Labs Review Labs Reviewed  CBC WITH DIFFERENTIAL/PLATELET - Abnormal; Notable for the following:    WBC 15.2 (*)    RBC 3.02 (*)    Hemoglobin 8.4 (*)    HCT 31.4 (*)    MCV 104.0 (*)    MCHC 26.8 (*)    RDW 16.4 (*)    Neutro Abs 12.6 (*)    Monocytes Absolute 1.4 (*)    All other components within normal limits  URINALYSIS, ROUTINE W REFLEX MICROSCOPIC (NOT AT [facility]) - Abnormal; Notable for the following:    APPearance CLOUDY (*)    Glucose, UA >1000 (*)    Ketones, ur 15 (*)    All other components within normal  limits  BLOOD GAS, ARTERIAL - Abnormal; Notable for the following:    pH, Arterial 6.856 (*)    pCO2 arterial 21.6 (*)    pO2, Arterial 465 (*)    Bicarbonate 3.6 (*)    Acid-base deficit 27.8 (*)    All other components within normal limits  BASIC METABOLIC PANEL - Abnormal; Notable for the following:    Sodium 132 (*)    Potassium 5.8 (*)    Chloride 100 (*)    CO2 <7 (*)    Glucose, Bld 1123 (*)    BUN 62 (*)    Creatinine, Ser 2.53 (*)    Calcium  7.3 (*)    GFR calc non Af Amer 24 (*)    GFR calc Af Amer 28 (*)    All other components within normal limits  TROPONIN I - Abnormal; Notable for the following:    Troponin I >65.00 (*)    All other components within normal limits  URINE MICROSCOPIC-ADD ON - Abnormal; Notable for the following:    Squamous Epithelial / LPF 0-5 (*)    Bacteria, UA RARE (*)    Casts HYALINE CASTS (*)    All other components within normal limits  CBG MONITORING, ED - Abnormal; Notable for the following:    Glucose-Capillary >600 (*)    All other components within normal limits  I-STAT CG4 LACTIC ACID, ED - Abnormal; Notable for the following:    Lactic Acid, Venous 5.51 (*)    All other components within normal limits  I-STAT CG4 LACTIC ACID, ED - Abnormal; Notable for the following:    Lactic Acid, Venous 8.04 (*)    All other components within normal limits  CULTURE, BLOOD (ROUTINE X 2)  CULTURE, BLOOD (ROUTINE X 2)  MAGNESIUM  CBC WITH DIFFERENTIAL/PLATELET    Imaging Review Dg Chest Portable 1 View  2016-02-02  CLINICAL DATA:  Hypotension.  Hypothermia. EXAM: PORTABLE CHEST 1 VIEW COMPARISON:  11/22/2015 FINDINGS: There is stable elevation of the left hemidiaphragm. No confluent consolidation. Mild chronic appearing interstitial coarsening. No large effusions. Mildly prominent mediastinal contours appear to be vascular, accentuated due to the supine AP examination. IMPRESSION: Stable left hemidiaphragm elevation. Chronic interstitial  coarsening. No consolidation or effusion Electronically Signed   By: Ellery Plunk M.D.   On: 2016-02-02 22:31   I have personally reviewed and evaluated these images and lab results as part of my medical decision-making.   EKG Interpretation   Date/Time:  Monday 2016-02-02 21:42:37 EDT Ventricular Rate:  72 PR Interval:  177 QRS Duration: 150 QT Interval:  463 QTC Calculation: 507 R Axis:   90 Text Interpretation:  Sinus rhythm Probable left atrial enlargement Left  bundle branch block LBBB new from previos 11/2015 Confirmed by Dannon Perlow  MD,  Rianna Lukes ([id number]) on 02/02/2016 10:49:38 PM      MDM   Final diagnoses:  Non-ST elevation (NSTEMI) myocardial infarction [facility])  Cardiogenic shock (HCC)  Cardiac arrest (HCC)    10:45 PM Called to room. Pt with agonal breathing. Mottled appearance. Increasing bradycardic to down to 30s.  Briefly lost pulses. He is DNR.  Fluid resuscitation continued and he was placed on oxygen.  I expected him to die. After about a minute though his respiratory rate began increasing as did his heart rate. His color improved. Labs pending at this point. His EKG today does show a new LBBB. Hx of CAD. Rectal asa ordered. Consider significant electrolyte abnormalities, particularly with marked hyperglycemia. Calcium and bicarb given empirically. Continue IVF. Continue to warm. Empiric abx previously ordered.   11:03 PM He is now more awake and actually answering some questions although confused. He is critically ill. Will continue therapies in keeping with goals outlined in palliative care note just a couple months ago.   Unfortunately pt with repeat episodes of bradycardia and hypotension. Son, daughter in law and grandson came to see him. They confirm wishes of DNR. Shortly after they left he passed away. Time of death 36. Suspect precipitant was a massive MI with Troponin >65 and new LBBBB. Raeford Razor, MD 01/19/16 1620

## 2016-01-13 NOTE — Progress Notes (Signed)
Pharmacy Antibiotic Note  Benjamin Hodge is a 75 y.o. male admitted on Jan 24, 2016 with sepsis.  Pharmacy has been consulted for vancomycin dosing.  Plan: Vancomycin 1Gm  x1 ED then 500mg  IV q24h (VT=15-20 mg/L) Zosyn 3.375 Gm x1 ED f/u     Temp (24hrs), Avg:93.9 F (34.4 C), Min:93.9 F (34.4 C), Max:93.9 F (34.4 C)   Recent Labs Lab 01-24-2016 2219  LATICACIDVEN 5.51*    CrCl cannot be calculated (Unknown ideal weight.).    Allergies  Allergen Reactions  . Ace Inhibitors Other (See Comments)    dizzy  . Cymbalta [Duloxetine Hcl] Other (See Comments)    dizzy  . Lipitor [Atorvastatin] Other (See Comments)    Caused pain all over body.  . Bee Venom Swelling  . Glucerna [Nutritional Supplements] Diarrhea  . Angiotensin Receptor Blockers Other (See Comments)    unknown    Antimicrobials this admission: 5/8 zosyn >>  5/8 vancomycin >>   Dose adjustments this admission:   Microbiology results:  BCx:   UCx:    Sputum:    MRSA PCR:   Thank you for allowing pharmacy to be a part of this patient's care.  Lorenza Evangelist Jan 24, 2016 10:33 PM

## 2016-01-13 NOTE — ED Notes (Signed)
Notified Daphine Deutscher NT, that nursing assistance was needed. When entering the room, pt's head was turned to the right and his eyes were fixed with no movements. Pt was not breathing and his HR was in the 30's. Dr. Marylen Ponto was immediately notified and came to the room. Pt is talking agonal breaths and remains bradycardiac at 50bpm. Non-rebreather at 15L/min was placed on patient.

## 2016-01-14 LAB — BASIC METABOLIC PANEL
BUN: 62 mg/dL — ABNORMAL HIGH (ref 6–20)
CO2: <7 mmol/L — ABNORMAL LOW (ref 22–32)
Calcium: 7.3 mg/dL — ABNORMAL LOW (ref 8.9–10.3)
Chloride: 100 mmol/L — ABNORMAL LOW (ref 101–111)
Creatinine, Ser: 2.53 mg/dL — ABNORMAL HIGH (ref 0.61–1.24)
GFR calc Af Amer: 28 mL/min — ABNORMAL LOW (ref >60)
GFR calc non Af Amer: 24 mL/min — ABNORMAL LOW (ref >60)
Glucose, Bld: 1123 mg/dL (ref 65–99)
Potassium: 5.8 mmol/L — ABNORMAL HIGH (ref 3.5–5.1)
Sodium: 132 mmol/L — ABNORMAL LOW (ref 135–145)

## 2016-01-14 LAB — CBC WITH DIFFERENTIAL/PLATELET
Basophils Absolute: 0 10*3/uL (ref 0.0–0.1)
Basophils Relative: 0 %
Eosinophils Absolute: 0.2 10*3/uL (ref 0.0–0.7)
Eosinophils Relative: 1 %
HCT: 26.2 % — ABNORMAL LOW (ref 39.0–52.0)
Hemoglobin: 7.4 g/dL — ABNORMAL LOW (ref 13.0–17.0)
Lymphocytes Relative: 11 %
Lymphs Abs: 1.8 10*3/uL (ref 0.7–4.0)
MCH: 27.6 pg (ref 26.0–34.0)
MCHC: 28.2 g/dL — ABNORMAL LOW (ref 30.0–36.0)
MCV: 98.5 fL (ref 78.0–100.0)
Monocytes Absolute: 1.3 10*3/uL — ABNORMAL HIGH (ref 0.1–1.0)
Monocytes Relative: 8 %
Neutro Abs: 13 10*3/uL — ABNORMAL HIGH (ref 1.7–7.7)
Neutrophils Relative %: 80 %
Platelets: 306 10*3/uL (ref 150–400)
RBC: 2.68 MIL/uL — ABNORMAL LOW (ref 4.22–5.81)
RDW: 57.8 % — ABNORMAL HIGH (ref 11.5–15.5)
WBC Morphology: INCREASED
WBC: 16.3 10*3/uL — ABNORMAL HIGH (ref 4.0–10.5)

## 2016-01-14 LAB — I-STAT CG4 LACTIC ACID, ED: Lactic Acid, Venous: 8.04 mmol/L (ref 0.5–2.0)

## 2016-01-14 LAB — MAGNESIUM: Magnesium: 1.8 mg/dL (ref 1.7–2.4)

## 2016-01-14 LAB — TROPONIN I: Troponin I: >65 ng/mL (ref <0.031)

## 2016-01-14 MED ORDER — VANCOMYCIN HCL 500 MG IV SOLR: 500.0000 mg | INTRAVENOUS | Status: DC

## 2016-01-14 MED ORDER — SODIUM CHLORIDE 0.9 % IV BOLUS (SEPSIS)
1000.0000 mL | Freq: Once | INTRAVENOUS | Status: AC
Start: 2016-01-14 — End: 2016-01-14
  Administered 2016-01-14: 1000 mL via INTRAVENOUS

## 2016-01-19 LAB — CULTURE, BLOOD (ROUTINE X 2)
Culture: NO GROWTH
Culture: NO GROWTH

## 2016-02-06 NOTE — ED Notes (Signed)
Dr. Juleen China came to bedside and provided him patients son number,

## 2016-02-06 NOTE — ED Notes (Signed)
Dr. Clarene Duke at bedside evaluating patients cardiac status. Determined time of death at 00:55.

## 2016-02-06 NOTE — ED Notes (Addendum)
While giving the ASA, pt had a loose, brown bowel movement. Performed patient hygiene and repositioned patient for better comfort. While performing these actions, pt did brady done to the 50's. Kept family informed about his care and steps to follow.

## 2016-02-06 NOTE — ED Notes (Signed)
Patients HR is bradycardic at 27 with being hypotension at 48/38. Pt is having agonal breathing 4-5 times a minute. Non-rebreather remains on patient. Pt is lying still with pale skin color.

## 2016-02-06 NOTE — ED Notes (Signed)
[facility] returned call and declined pt for organ or eye donation  Referal [phone]  Spoke with [facility]

## 2016-02-06 DEATH — deceased

## 2016-03-02 IMAGING — CT CT HEAD W/O CM
2 series · 16 of 30 positions shown, 20 images · non-contrast
Comparison: 10/18/2015

CLINICAL DATA: Generalized weakness and altered mental status. Pt
has no complaints of pain or injury Hx: HTN, Diabetes, MI, Chronic
fatigue

EXAM:
CT HEAD WITHOUT CONTRAST
TECHNIQUE: Contiguous axial images were obtained from the base of the skull
through the vertex without intravenous contrast.

[Series 201: head w/o, idose (1) · axial · non-contrast · 0.44mm/px · z∈[+4,+139]mm · 13 of 33 slices shown, 17 images]
[im 3/33  brain]
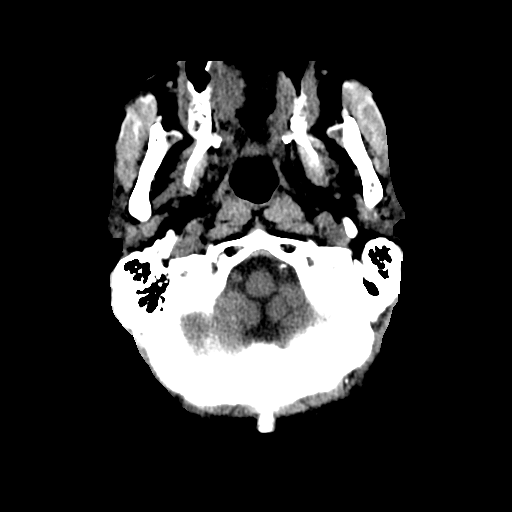
[im 3/33  bone]
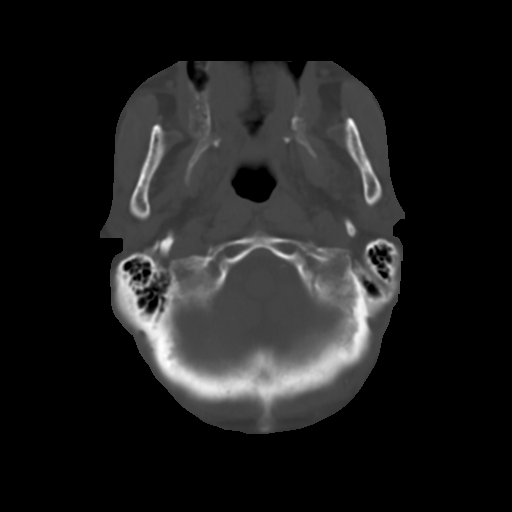
[im 5/33  brain]
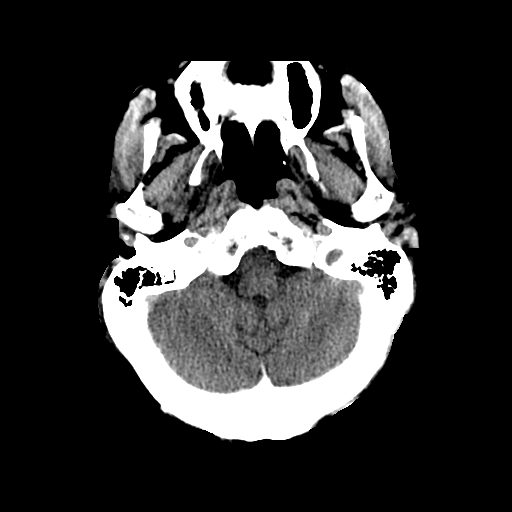
[im 7/33  brain]
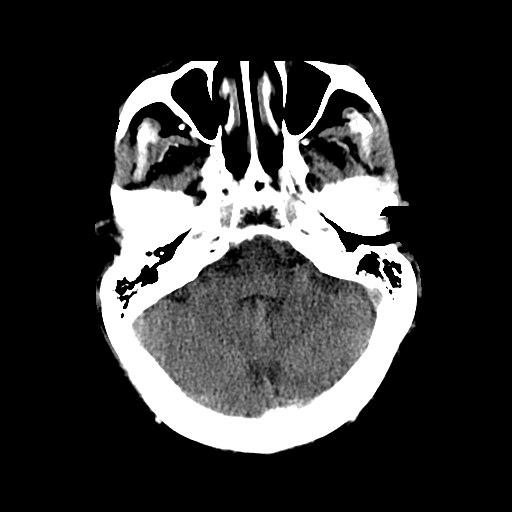
[im 10/33  brain]
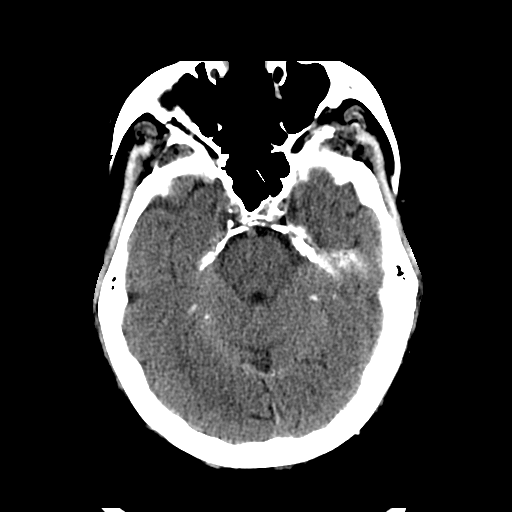
[im 12/33  brain]
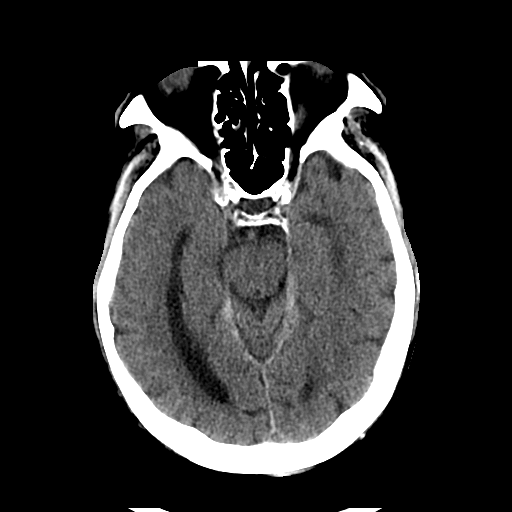
[im 12/33  bone]
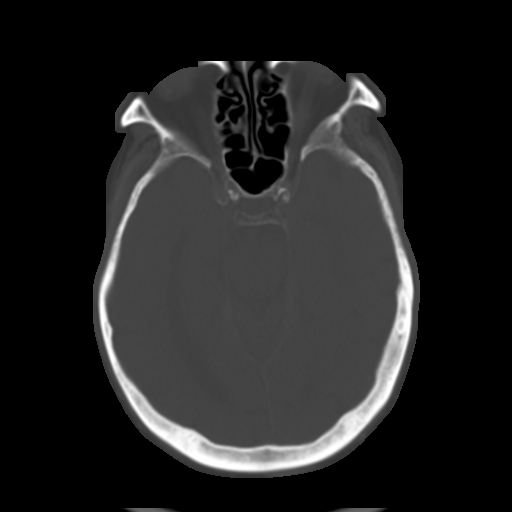
[im 14/33  brain]
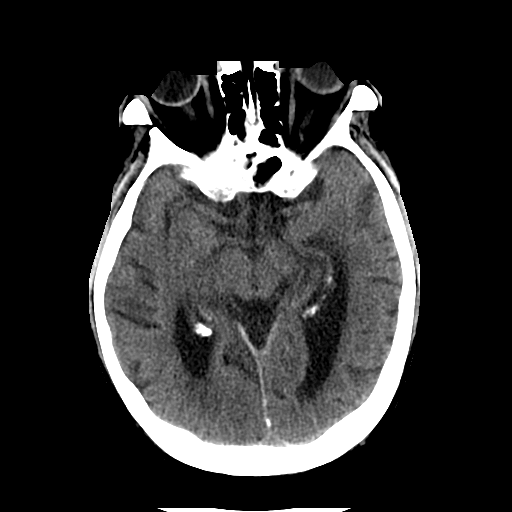
[im 17/33  brain]
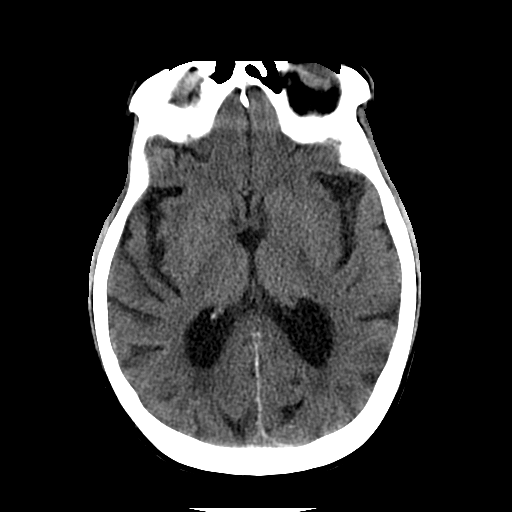
[im 19/33  brain]
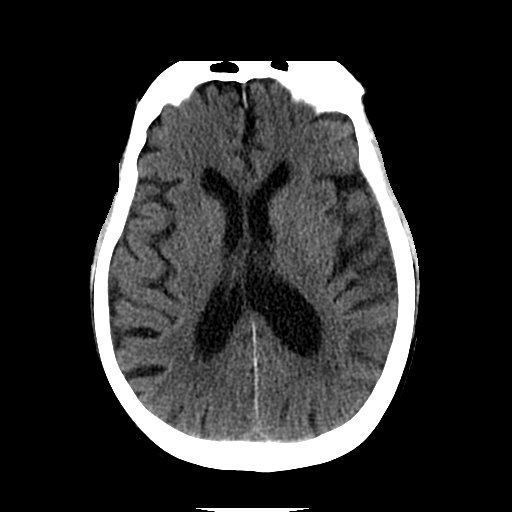
[im 21/33  brain]
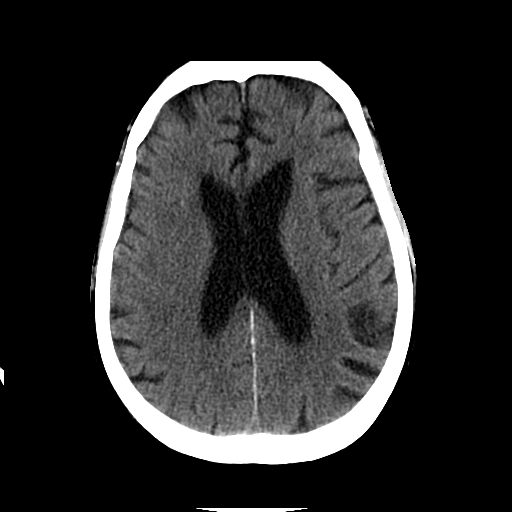
[im 21/33  bone]
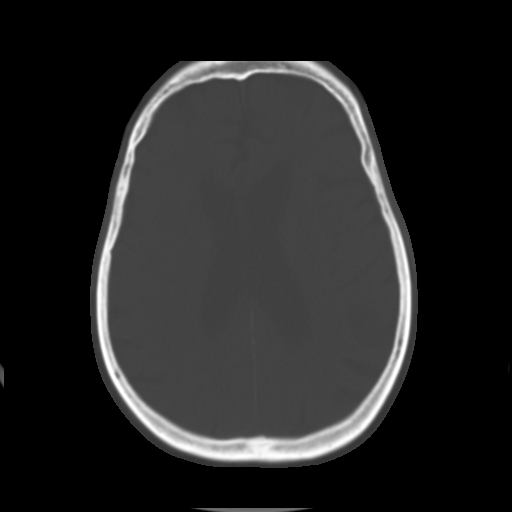
[im 23/33  brain]
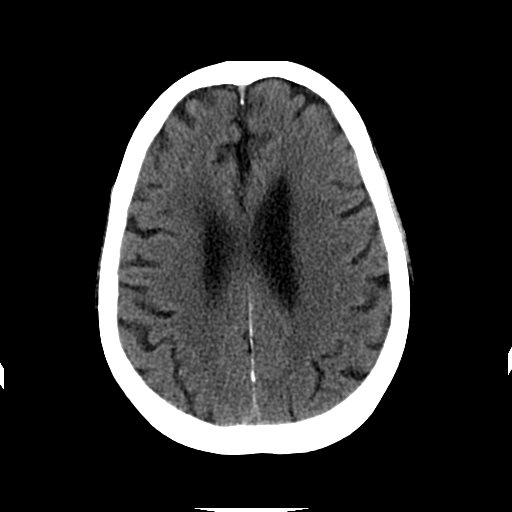
[im 26/33  brain]
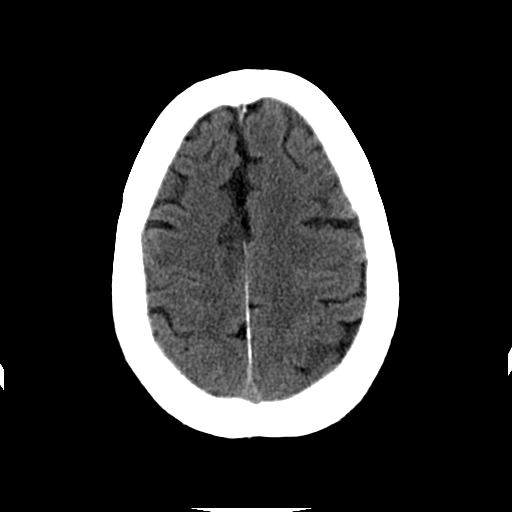
[im 28/33  brain]
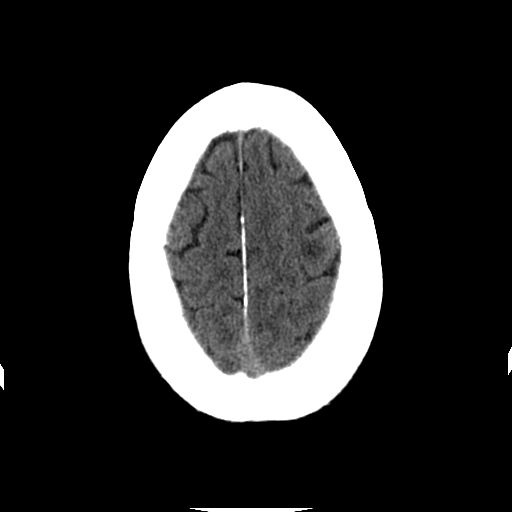
[im 30/33  brain]
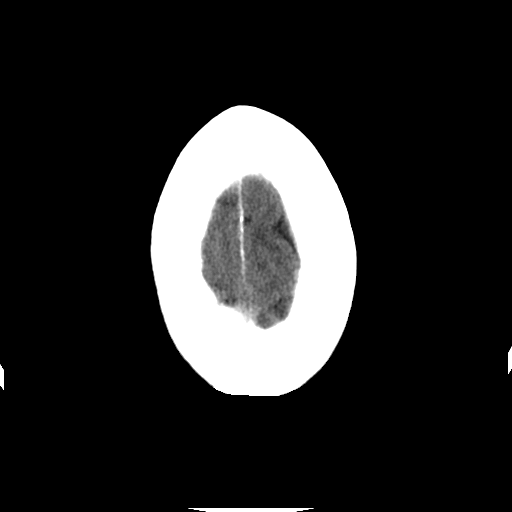
[im 30/33  bone]
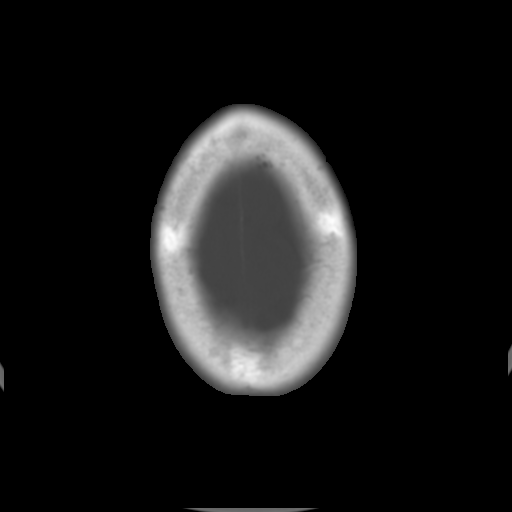

[Series 202: head w/o bone, idose (1) · axial · non-contrast · 0.44mm/px · z∈[+4,+49]mm · 3 of 33 slices shown]
[im 3/33  bone]
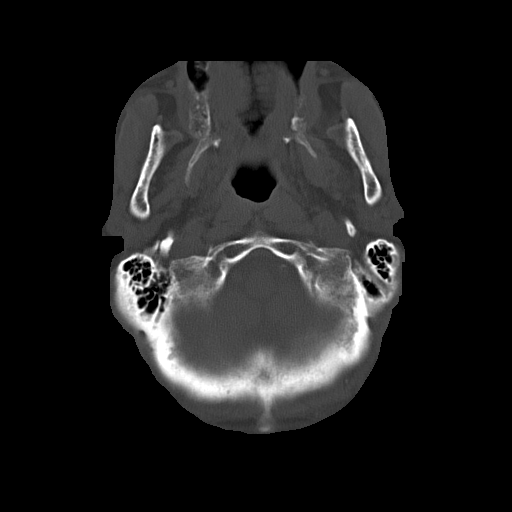
[im 7/33  bone]
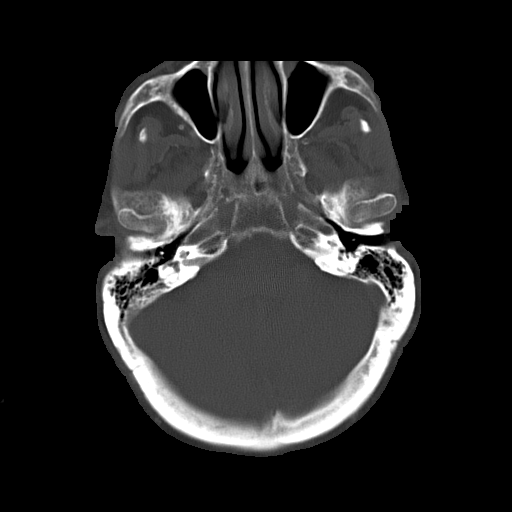
[im 12/33  bone]
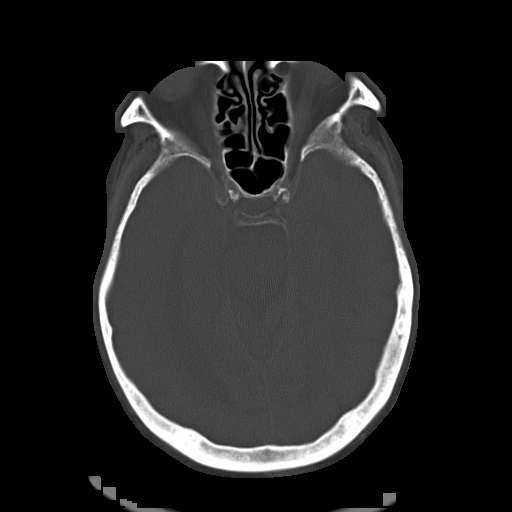

[16 of 30 positions shown; findings below may reference images not displayed]

FINDINGS: Brain: Mild atrophy. No evidence of acute infarction, hemorrhage,
extra-axial collection, ventriculomegaly, or mass effect.

Vascular: No hyperdense vessel or unexpected calcification.
Atherosclerotic and physiologic intracranial calcifications.

Skull: Negative for fracture or focal lesion.

Sinuses/Orbits: No acute findings.

Other: None.
IMPRESSION: 1. Negative for bleed or other acute intracranial process.

## 2016-03-02 IMAGING — CR DG CHEST 1V
2 series · 2 of 2 positions shown · non-contrast
Comparison: 10/18/2015.  09/27/2015.

CLINICAL DATA: Altered mental status.  Lethargy.  Cough.

EXAM:
CHEST 1 VIEW

[chest ap (1 of 2)]
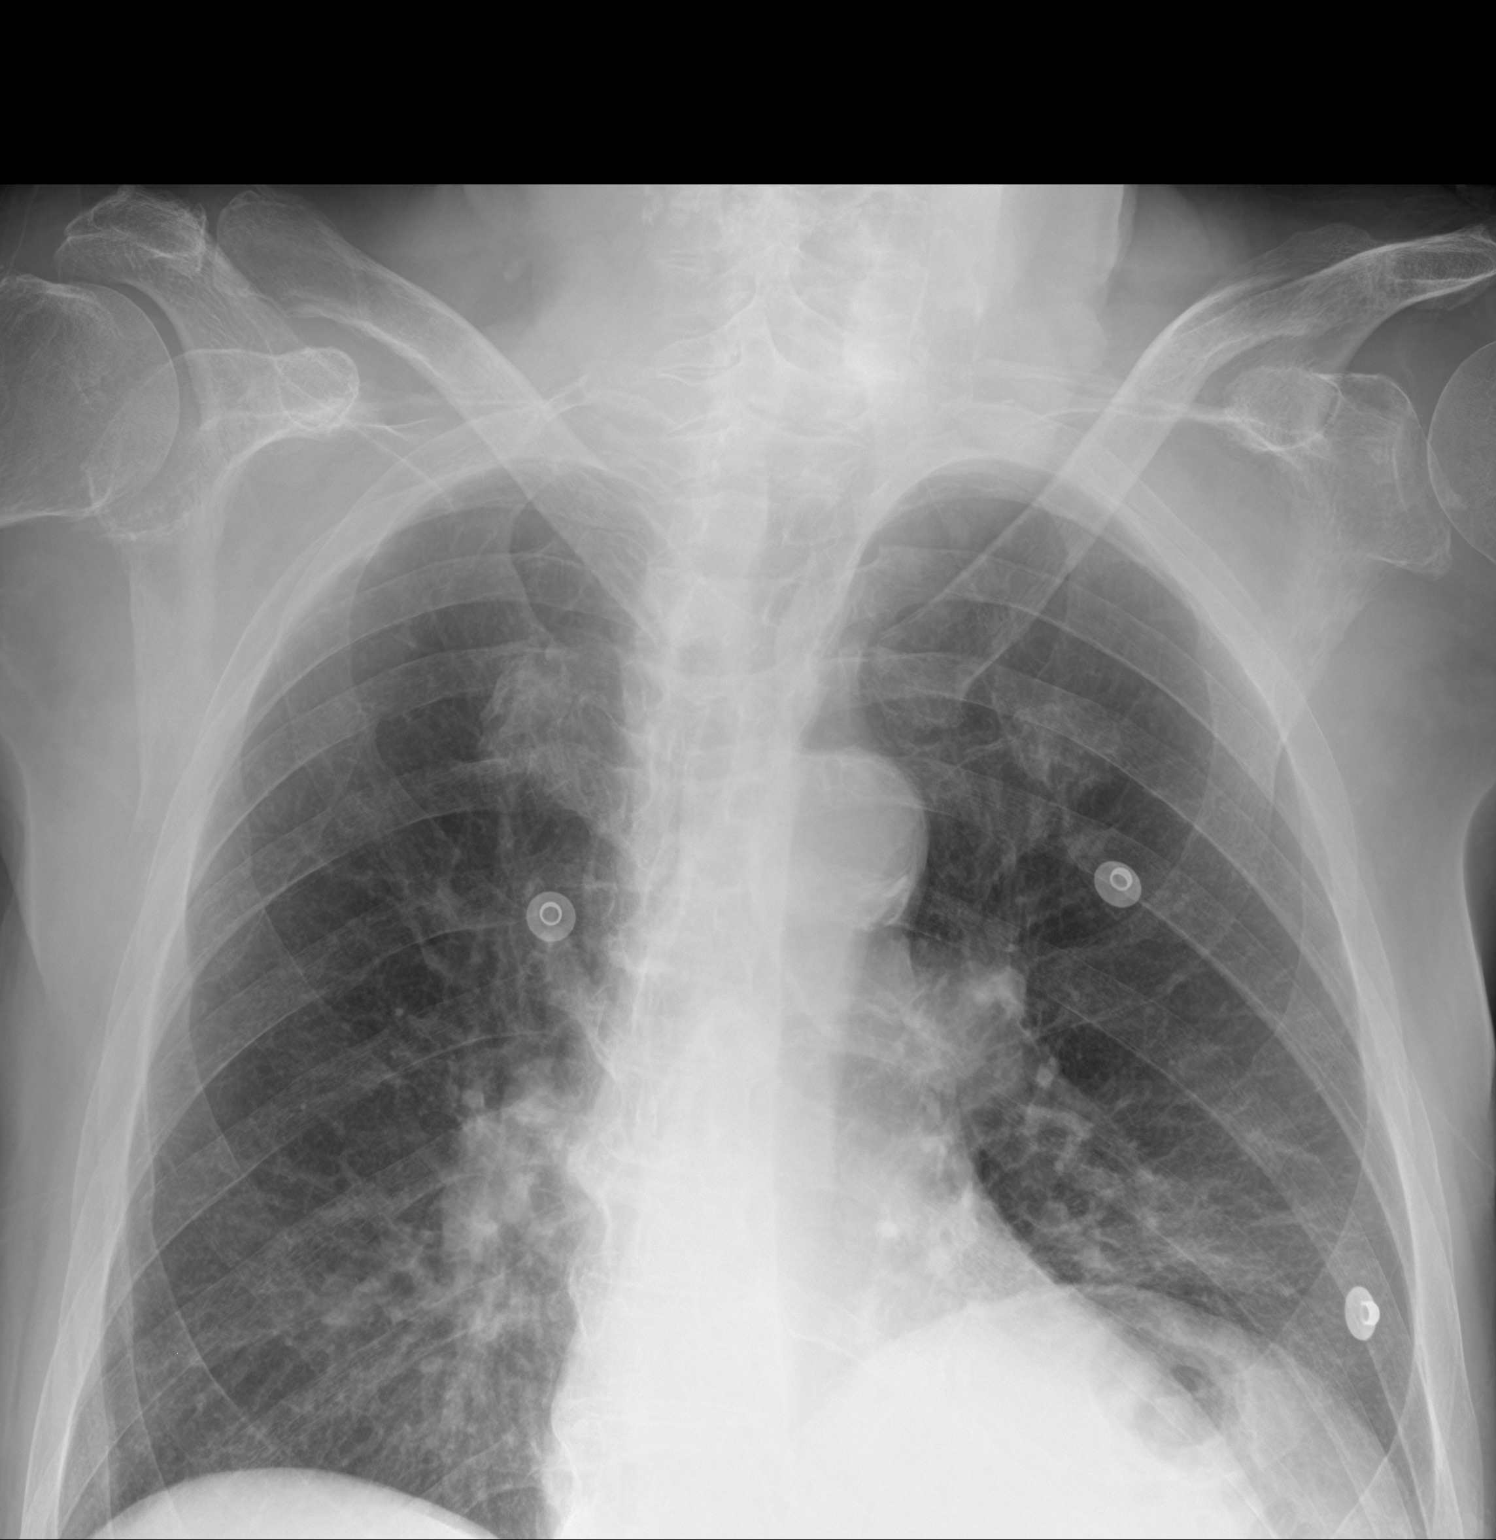

[chest ap (2 of 2)]
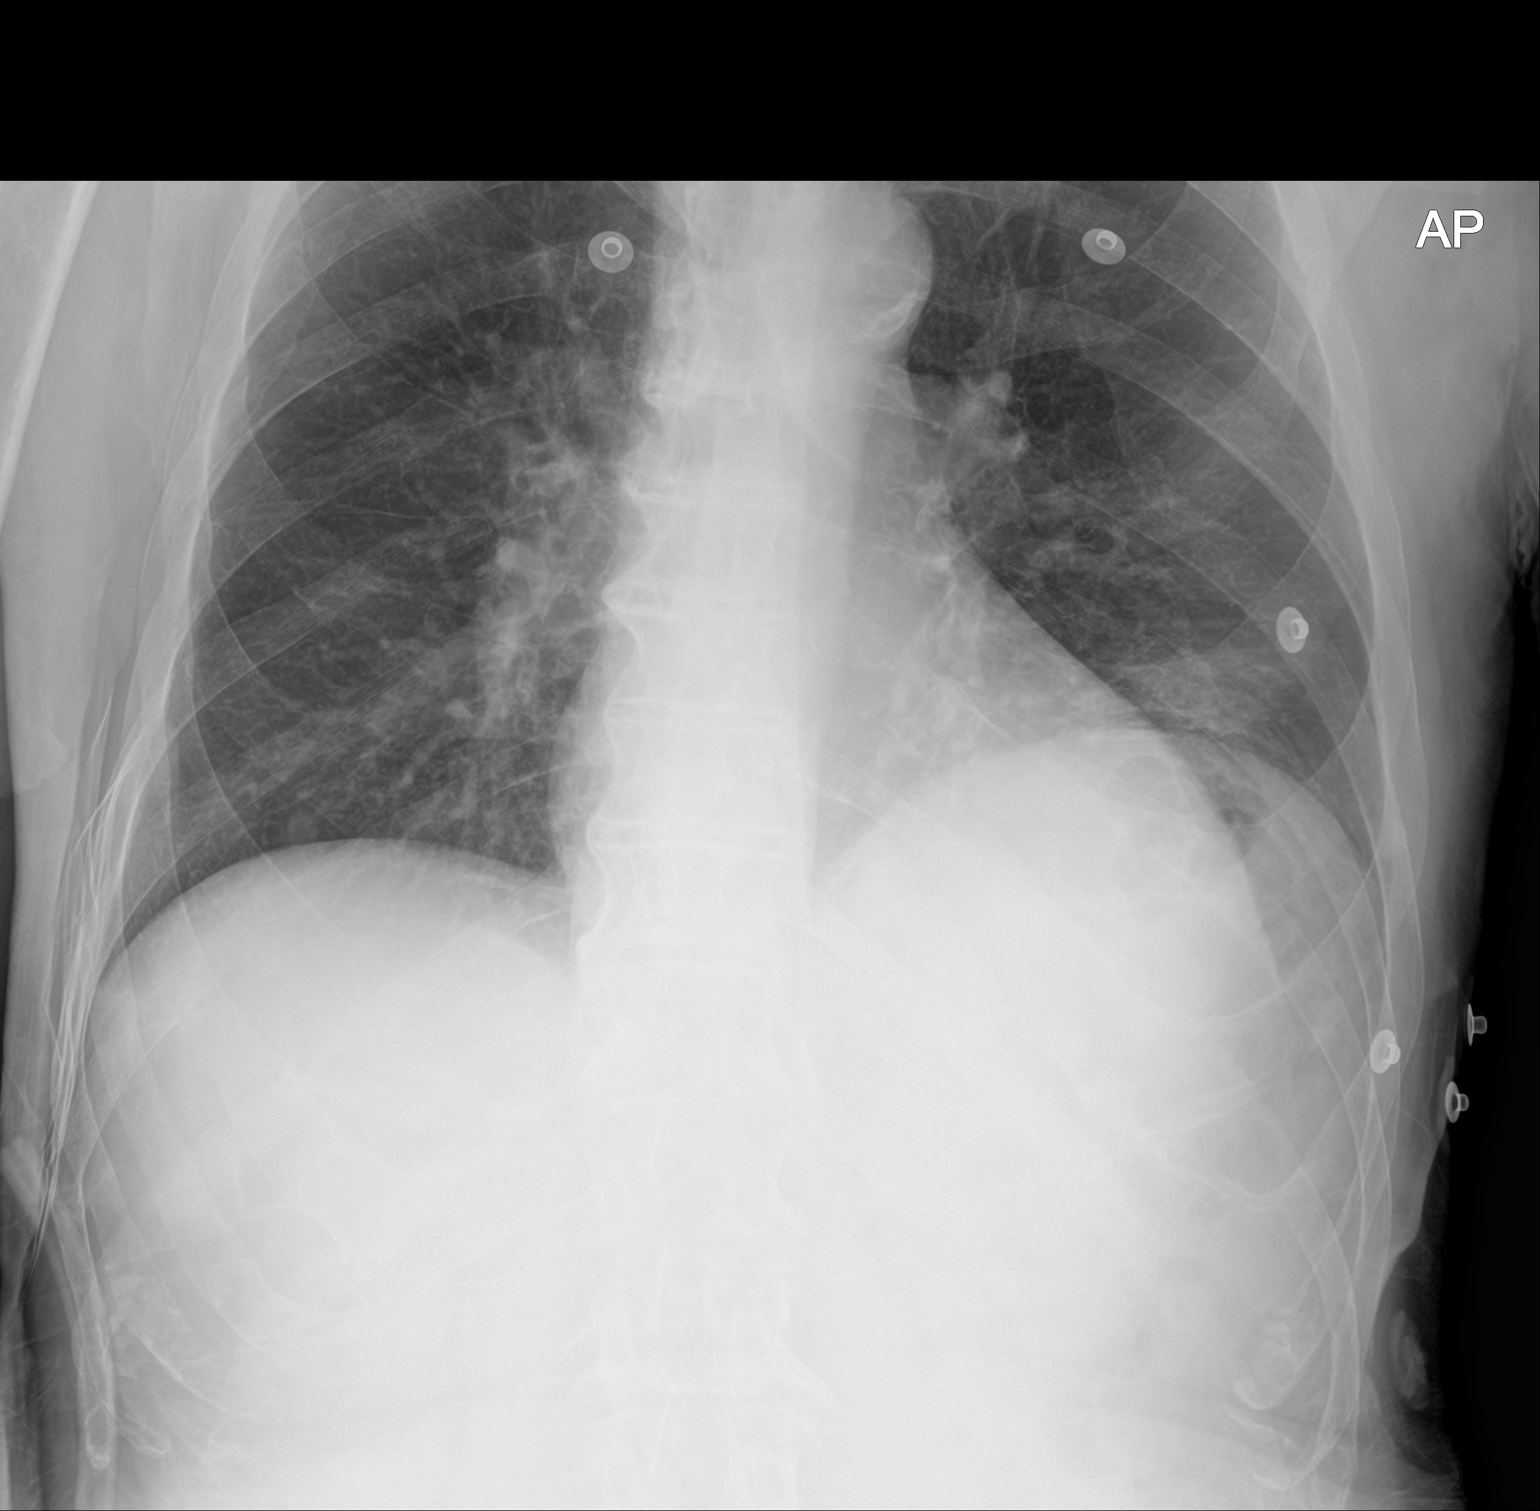

[2 of 2 positions shown; findings below may reference images not displayed]

FINDINGS: Heart size is normal with some left ventricular prominence. There is
calcification of the aorta. There is chronic elevation of left
hemidiaphragm. There is increased density at the left base when
compared to previous studies consistent with left lower lobe
pneumonia. No lobar collapse. No effusions.
IMPRESSION: Chronically elevated left hemidiaphragm. Newly seen left base
infiltrate consistent with bronchopneumonia.

## 2016-07-21 LAB — BLOOD GAS, ARTERIAL
Acid-base deficit: 27.8 mmol/L — ABNORMAL HIGH (ref 0.0–2.0)
Bicarbonate: 3.6 mEq/L — ABNORMAL LOW (ref 20.0–24.0)
Drawn by: 290171
FIO2: 100
O2 Saturation: 98.9 %
Patient temperature: 98.6
TCO2: 4 mmol/L (ref 0–100)
pCO2 arterial: 21.6 mmHg — ABNORMAL LOW (ref 35.0–45.0)
pH, Arterial: 6.856 — CL (ref 7.350–7.450)
pO2, Arterial: 465 mmHg — ABNORMAL HIGH (ref 80.0–100.0)

## 2021-01-17 ENCOUNTER — Other Ambulatory Visit (HOSPITAL_BASED_OUTPATIENT_CLINIC_OR_DEPARTMENT_OTHER): Payer: Self-pay
